# Patient Record
Sex: Female | Born: 1945
Health system: Southern US, Community
[De-identification: ages and names within clinical notes are randomized; demographics above are authoritative.]

## PROBLEM LIST (undated history)

## (undated) DIAGNOSIS — E785 Hyperlipidemia, unspecified: Secondary | ICD-10-CM

## (undated) DIAGNOSIS — M545 Low back pain, unspecified: Secondary | ICD-10-CM

## (undated) DIAGNOSIS — I10 Essential (primary) hypertension: Secondary | ICD-10-CM

## (undated) DIAGNOSIS — Z5189 Encounter for other specified aftercare: Secondary | ICD-10-CM

## (undated) DIAGNOSIS — T7840XA Allergy, unspecified, initial encounter: Secondary | ICD-10-CM

## (undated) DIAGNOSIS — I499 Cardiac arrhythmia, unspecified: Secondary | ICD-10-CM

## (undated) DIAGNOSIS — D631 Anemia in chronic kidney disease: Secondary | ICD-10-CM

## (undated) DIAGNOSIS — D649 Anemia, unspecified: Secondary | ICD-10-CM

## (undated) DIAGNOSIS — I739 Peripheral vascular disease, unspecified: Secondary | ICD-10-CM

## (undated) DIAGNOSIS — H269 Unspecified cataract: Secondary | ICD-10-CM

## (undated) DIAGNOSIS — Z96649 Presence of unspecified artificial hip joint: Secondary | ICD-10-CM

## (undated) DIAGNOSIS — J309 Allergic rhinitis, unspecified: Secondary | ICD-10-CM

## (undated) DIAGNOSIS — J302 Other seasonal allergic rhinitis: Secondary | ICD-10-CM

## (undated) DIAGNOSIS — D509 Iron deficiency anemia, unspecified: Secondary | ICD-10-CM

## (undated) DIAGNOSIS — N183 Chronic kidney disease, stage 3 (moderate): Secondary | ICD-10-CM

## (undated) DIAGNOSIS — M1990 Unspecified osteoarthritis, unspecified site: Secondary | ICD-10-CM

## (undated) DIAGNOSIS — C801 Malignant (primary) neoplasm, unspecified: Secondary | ICD-10-CM

## (undated) HISTORY — DX: Allergic rhinitis, unspecified: J30.9

## (undated) HISTORY — DX: Allergy, unspecified, initial encounter: T78.40XA

## (undated) HISTORY — DX: Hyperlipidemia, unspecified: E78.5

## (undated) HISTORY — DX: Essential (primary) hypertension: I10

## (undated) HISTORY — DX: Unspecified cataract: H26.9

## (undated) HISTORY — DX: Presence of unspecified artificial hip joint: Z96.649

## (undated) HISTORY — DX: Peripheral vascular disease, unspecified: I73.9

## (undated) HISTORY — DX: Anemia in chronic kidney disease: D63.1

## (undated) HISTORY — DX: Iron deficiency anemia, unspecified: D50.9

## (undated) HISTORY — DX: Other seasonal allergic rhinitis: J30.2

## (undated) HISTORY — DX: Encounter for other specified aftercare: Z51.89

## (undated) HISTORY — DX: Low back pain, unspecified: M54.50

## (undated) HISTORY — PX: TUBAL LIGATION: SHX77

## (undated) HISTORY — PX: OTHER SURGICAL HISTORY: SHX169

## (undated) HISTORY — DX: Chronic kidney disease, stage 3 (moderate): N18.3

## (undated) HISTORY — DX: Low back pain: M54.5

---

## 2008-10-27 ENCOUNTER — Ambulatory Visit: Payer: Self-pay | Admitting: Family Medicine

## 2008-10-27 DIAGNOSIS — H269 Unspecified cataract: Secondary | ICD-10-CM | POA: Insufficient documentation

## 2008-10-27 DIAGNOSIS — H4010X0 Unspecified open-angle glaucoma, stage unspecified: Secondary | ICD-10-CM | POA: Insufficient documentation

## 2008-10-28 LAB — CONVERTED CEMR LAB
BUN: 11 mg/dL (ref 6–23)
CO2: 30 meq/L (ref 19–32)
Calcium: 9.8 mg/dL (ref 8.4–10.5)
GFR calc Af Amer: 109 mL/min
Glucose, Bld: 105 mg/dL — ABNORMAL HIGH (ref 70–99)
Sodium: 143 meq/L (ref 135–145)

## 2008-10-30 ENCOUNTER — Ambulatory Visit: Payer: Self-pay | Admitting: Family Medicine

## 2008-11-06 ENCOUNTER — Ambulatory Visit: Payer: Self-pay | Admitting: Family Medicine

## 2008-11-06 DIAGNOSIS — R5383 Other fatigue: Secondary | ICD-10-CM

## 2008-11-06 DIAGNOSIS — R5381 Other malaise: Secondary | ICD-10-CM | POA: Insufficient documentation

## 2008-12-08 HISTORY — PX: OTHER SURGICAL HISTORY: SHX169

## 2008-12-10 ENCOUNTER — Ambulatory Visit: Payer: Self-pay | Admitting: Family Medicine

## 2008-12-10 LAB — CONVERTED CEMR LAB
ALT: 13 units/L (ref 0–35)
Alkaline Phosphatase: 52 units/L (ref 39–117)
Bilirubin, Direct: 0.2 mg/dL (ref 0.0–0.3)
CO2: 29 meq/L (ref 19–32)
Calcium: 9.2 mg/dL (ref 8.4–10.5)
GFR calc Af Amer: 49 mL/min
Glucose, Bld: 94 mg/dL (ref 70–99)
LDL Cholesterol: 147 mg/dL
Potassium: 4.1 meq/L (ref 3.5–5.1)
Sodium: 132 meq/L — ABNORMAL LOW (ref 135–145)
TSH: 1.95 microintl units/mL (ref 0.35–5.50)
Total Bilirubin: 0.8 mg/dL (ref 0.3–1.2)
Total CHOL/HDL Ratio: 5.2
Triglycerides: 132 mg/dL (ref 0–149)

## 2008-12-15 ENCOUNTER — Encounter: Payer: Self-pay | Admitting: Family Medicine

## 2008-12-15 ENCOUNTER — Other Ambulatory Visit: Admission: RE | Admit: 2008-12-15 | Discharge: 2008-12-15 | Payer: Self-pay | Admitting: Family Medicine

## 2008-12-15 ENCOUNTER — Ambulatory Visit: Payer: Self-pay | Admitting: Family Medicine

## 2008-12-15 DIAGNOSIS — E785 Hyperlipidemia, unspecified: Secondary | ICD-10-CM | POA: Insufficient documentation

## 2008-12-15 DIAGNOSIS — I10 Essential (primary) hypertension: Secondary | ICD-10-CM | POA: Insufficient documentation

## 2008-12-15 LAB — CONVERTED CEMR LAB
HDL goal, serum: 40 mg/dL
LDL Goal: 130 mg/dL

## 2008-12-18 ENCOUNTER — Encounter (INDEPENDENT_AMBULATORY_CARE_PROVIDER_SITE_OTHER): Payer: Self-pay | Admitting: *Deleted

## 2009-01-13 ENCOUNTER — Ambulatory Visit: Payer: Self-pay | Admitting: Family Medicine

## 2009-01-13 DIAGNOSIS — R21 Rash and other nonspecific skin eruption: Secondary | ICD-10-CM | POA: Insufficient documentation

## 2009-01-21 ENCOUNTER — Telehealth: Payer: Self-pay | Admitting: Family Medicine

## 2009-03-19 ENCOUNTER — Ambulatory Visit: Payer: Self-pay | Admitting: Family Medicine

## 2009-03-23 LAB — CONVERTED CEMR LAB: Cholesterol: 219 mg/dL — ABNORMAL HIGH (ref 0–200)

## 2009-03-31 ENCOUNTER — Ambulatory Visit: Payer: Self-pay | Admitting: Family Medicine

## 2009-05-07 HISTORY — PX: OTHER SURGICAL HISTORY: SHX169

## 2009-06-03 ENCOUNTER — Encounter: Admission: RE | Admit: 2009-06-03 | Discharge: 2009-06-03 | Payer: Self-pay | Admitting: Family Medicine

## 2009-06-03 ENCOUNTER — Ambulatory Visit: Payer: Self-pay | Admitting: Family Medicine

## 2009-06-03 DIAGNOSIS — M79609 Pain in unspecified limb: Secondary | ICD-10-CM

## 2009-06-03 DIAGNOSIS — M5416 Radiculopathy, lumbar region: Secondary | ICD-10-CM | POA: Insufficient documentation

## 2009-06-04 ENCOUNTER — Ambulatory Visit: Payer: Self-pay

## 2009-06-04 ENCOUNTER — Encounter: Payer: Self-pay | Admitting: Family Medicine

## 2009-06-17 ENCOUNTER — Ambulatory Visit: Payer: Self-pay | Admitting: Cardiovascular Disease

## 2009-06-17 DIAGNOSIS — I70219 Atherosclerosis of native arteries of extremities with intermittent claudication, unspecified extremity: Secondary | ICD-10-CM | POA: Insufficient documentation

## 2009-06-24 ENCOUNTER — Ambulatory Visit: Payer: Self-pay | Admitting: Family Medicine

## 2009-06-29 ENCOUNTER — Encounter: Admission: RE | Admit: 2009-06-29 | Discharge: 2009-06-29 | Payer: Self-pay | Admitting: Family Medicine

## 2009-07-08 ENCOUNTER — Encounter: Payer: Self-pay | Admitting: Family Medicine

## 2009-07-09 ENCOUNTER — Telehealth: Payer: Self-pay | Admitting: Family Medicine

## 2009-07-21 ENCOUNTER — Encounter: Payer: Self-pay | Admitting: Family Medicine

## 2009-08-03 ENCOUNTER — Telehealth: Payer: Self-pay | Admitting: Family Medicine

## 2009-09-14 ENCOUNTER — Ambulatory Visit: Payer: Self-pay | Admitting: Cardiovascular Disease

## 2009-09-29 ENCOUNTER — Ambulatory Visit: Payer: Self-pay | Admitting: Family Medicine

## 2009-09-30 ENCOUNTER — Encounter: Payer: Self-pay | Admitting: Family Medicine

## 2009-10-08 ENCOUNTER — Encounter (INDEPENDENT_AMBULATORY_CARE_PROVIDER_SITE_OTHER): Payer: Self-pay | Admitting: *Deleted

## 2009-11-07 HISTORY — PX: CATARACT EXTRACTION, BILATERAL: SHX1313

## 2009-11-13 ENCOUNTER — Telehealth: Payer: Self-pay | Admitting: Family Medicine

## 2010-01-21 ENCOUNTER — Ambulatory Visit: Payer: Self-pay | Admitting: Family Medicine

## 2010-02-02 ENCOUNTER — Telehealth: Payer: Self-pay | Admitting: Family Medicine

## 2010-11-07 DIAGNOSIS — IMO0001 Reserved for inherently not codable concepts without codable children: Secondary | ICD-10-CM

## 2010-11-07 DIAGNOSIS — Z5189 Encounter for other specified aftercare: Secondary | ICD-10-CM

## 2010-11-07 HISTORY — DX: Reserved for inherently not codable concepts without codable children: IMO0001

## 2010-11-07 HISTORY — DX: Encounter for other specified aftercare: Z51.89

## 2010-12-09 NOTE — Progress Notes (Signed)
Summary: BP readings  Phone Note Call from Patient Call back at Home Phone [phone]   Caller: Patient Summary of Call: Pt was told to call and report BP readings.  She said they are still up, averaging 165/83.  Taking  BP medicine. Initial call taken by: Marty Heck CMA,  February 02, 2010 9:49 AM  Follow-up for Phone Call        OK, we will have to increase her Hydralazine - the new medication that I put her on. I am going to increase this to 25 mg three times a day and send to [facility]. Follow-up by: Owens Loffler MD,  February 03, 2010 9:12 AM  Additional Follow-up for Phone Call Additional follow up Details #1::        Advised pt. Additional Follow-up by: Marty Heck CMA,  February 03, 2010 9:27 AM    New/Updated Medications: HYDRALAZINE HCL 25 MG TABS (HYDRALAZINE HCL) 1 by mouth three times a day Prescriptions: HYDRALAZINE HCL 25 MG TABS (HYDRALAZINE HCL) 1 by mouth three times a day  #90 x 5   Entered and Authorized by:   Owens Loffler MD   Signed by:   Owens Loffler MD on 02/03/2010   Method used:   Electronically to        [id number] (retail)       [address] [place], [place]  [address]       Ph: [phone]       Fax: [phone]   RxID:   [id number]

## 2010-12-09 NOTE — Assessment & Plan Note (Signed)
Summary: F/U BLOOD PRESSURE/CLE   Vital Signs:  Patient profile:   65 year old female Height:      63 inches Weight:      161 pounds BMI:     28.62 Temp:     98.1 degrees F oral Pulse rate:   76 / minute Pulse rhythm:   regular BP sitting:   160 / 90  (left arm) Cuff size:   regular  Vitals Entered By: Zenda Alpers CMA Deborra Medina) (January 21, 2010 11:57 AM)  Serial Vital Signs/Assessments:  Time      Position  BP       Pulse  Resp  Temp     By                     200/102                        Zenda Alpers CMA (AAMA)  CC: CHECK BP   History of Present Illness: Chief complaint follow up bp  Recheck 160/90 after clonidine 0.1 mg  BP has been running 160's / 90's - mulitple meds changed after loss of insurance  Lost her job, and went ahead and retired. Plenty around house to do  Lives with home and son who is 21. Son doing OK.    ROS: GEN: No acute illnesses, no fevers, chills, sweats, fatigue, weight loss, or URI sx. GI: No n/v/d Pulm: No SOB, cough, wheezing Interactive and getting along well at home.  Otherwise, ROS is as per the HPI. No symptoms  GEN: WDWN, NAD, Non-toxic, A & O x 3 HEENT: Atraumatic, Normocephalic. Neck supple. No masses, No LAD. Ears and Nose: No external deformity. CV: RRR, No M/G/R. No JVD. No thrill. No extra heart sounds. PULM: CTA B, no wheezes, crackles, rhonchi. No retractions. No resp. distress. No accessory muscle use. EXTR: No c/c/e NEURO: Normal gait.  PSYCH: Normally interactive. Conversant. Not depressed or anxious appearing.  Calm demeanor.    Allergies (verified): No Known Drug Allergies   Impression & Recommendations:  Problem # 1:  HYPERTENSION (ICD-401.9) Assessment Deteriorated Elevated - start hydralazine and expect will need to titrate up dose.  Limited based on funds, and tried to work with multiple 4 dollar plans to get under control as good as possible  Her updated medication list for this problem includes:    Lisinopril-hydrochlorothiazide 20-12.5 Mg Tabs (Lisinopril-hydrochlorothiazide) .Marland Kitchen... 2 tabs by mouth daily    Metoprolol Tartrate 100 Mg Tabs (Metoprolol tartrate) .Marland Kitchen... 2 by mouth two times a day    Hydralazine Hcl 10 Mg Tabs (Hydralazine hcl) .Marland Kitchen... 1 by mouth three times a day  Complete Medication List: 1)  Multivitamins Tabs (Multiple vitamin) .... Take 1 tablet by mouth once a day 2)  Calcium Carbonate-vitamin D 600-400 Mg-unit Tabs (Calcium carbonate-vitamin d) .... Take 1 tablet by mouth once a day 3)  Eye Vitamins Caps (Multiple vitamins-minerals) .... Take 1 capsule by mouth once a day 4)  Lisinopril-hydrochlorothiazide 20-12.5 Mg Tabs (Lisinopril-hydrochlorothiazide) .... 2 tabs by mouth daily 5)  Metoprolol Tartrate 100 Mg Tabs (Metoprolol tartrate) .... 2 by mouth two times a day 6)  Triamcinolone Acetonide 0.1 % Crea (Triamcinolone acetonide) .... Apply to rash two times a day 7)  Aspirin 81 Mg Tbec (Aspirin) .... Take one tablet by mouth daily 8)  Pletal 100 Mg Tabs (Cilostazol) .... One tab twice a day 9)  Hydralazine Hcl 10 Mg Tabs (Hydralazine hcl) .Marland KitchenMarland KitchenMarland Kitchen  1 by mouth three times a day  Patient Instructions: 1)  Call me in 1 week with some BP readings 2)  goal less than 140 / 90 Prescriptions: HYDRALAZINE HCL 10 MG TABS (HYDRALAZINE HCL) 1 by mouth three times a day  #90 x 1   Entered and Authorized by:   Owens Loffler MD   Signed by:   Owens Loffler MD on 01/21/2010   Method used:   Electronically to        [facility] (retail)       [address] [place], [place]  [address]       Ph: [phone]       Fax: [phone]   RxID:   [id number]   Current Allergies (reviewed today): No known allergies    Medication Administration  Injection # 1:    Medication: clonodine 0.1    Diagnosis: HYPERTENSION (ICD-401.9)    Route: PO    Site: MOUTH    Given by: Zenda Alpers CMA Deborra Medina) (January 21, 2010 12:14  PM)  Orders Added: 1)  Est. Patient Level III [99213]   Vital Signs Ht: 63 in.  Wt: 161 lbs.  T: 98.1 deg F.  T site: oral  P: 76  Rhythm: regular  BP: 160/90 mm Hg

## 2010-12-09 NOTE — Progress Notes (Signed)
Summary: BP concerns  Phone Note Call from Patient Call back at Home Phone [phone]   Caller: Patient Call For: Owens Loffler MD Summary of Call: Patient called to let Dr. Lorelei Pont know that since he changed her BP medicine her BP has been running high.  She was on Norvasc 10mg  once daily, now she is taking Lisinopril-HCTZ 20-12.5mg  one tablet twice daily and Metoprolol 100mg  one tablet twice daily.  Before he changed her medication she said her BP was running around 120/80, now it is between [id number] systolic, [id number] diastolic.  Please advise Initial call taken by: Sherrian Divers CMA Deborra Medina),  November 13, 2009 9:23 AM  Follow-up for Phone Call        Non emergent BPs...wait recs of Dr. Lorelei Pont on Monday. Continue current meds.  Follow-up by: Eliezer Lofts MD,  November 13, 2009 1:01 PM  Additional Follow-up for Phone Call Additional follow up Details #1::        Patient advised as instructed. Additional Follow-up by: Sherrian Divers CMA Deborra Medina),  November 13, 2009 1:54 PM    Additional Follow-up for Phone Call Additional follow up Details #2::    Increase Metoprolol up to 200 mg by mouth two times a day   follow-up with me in 2-4 weeks  If she gets faint doing this, will have to decrease back to 100 mg two times a day and add something else  ok to call in more med, i think she was going to go to [facility] Follow-up by: Owens Loffler MD,  November 18, 2009 4:59 PM  New/Updated Medications: METOPROLOL TARTRATE 100 MG TABS (METOPROLOL TARTRATE) 2 by mouth two times a day Prescriptions: METOPROLOL TARTRATE 100 MG TABS (METOPROLOL TARTRATE) 2 by mouth two times a day  #120 x 1   Entered by:   Zenda Alpers CMA ([place])   Authorized by:   Owens Loffler MD   Signed by:   Zenda Alpers CMA ([place]) on 11/20/2009   Method used:   Electronically to        [id number] (retail)       [address] [place]       [place],   [address]       Ph:  [phone]       Fax: [phone]   RxID:   [id number]

## 2011-02-03 ENCOUNTER — Other Ambulatory Visit: Payer: Self-pay | Admitting: Family Medicine

## 2011-03-03 ENCOUNTER — Other Ambulatory Visit: Payer: Self-pay | Admitting: Family Medicine

## 2011-05-31 ENCOUNTER — Encounter: Payer: Self-pay | Admitting: Cardiovascular Disease

## 2011-07-19 ENCOUNTER — Ambulatory Visit (INDEPENDENT_AMBULATORY_CARE_PROVIDER_SITE_OTHER): Payer: Medicare Other | Admitting: Family Medicine

## 2011-07-19 ENCOUNTER — Ambulatory Visit: Payer: Self-pay | Admitting: Family Medicine

## 2011-07-19 ENCOUNTER — Encounter: Payer: Self-pay | Admitting: Family Medicine

## 2011-07-19 VITALS — BP 130/86 | HR 63 | Temp 98.0°F | Ht 62.0 in | Wt 153.9 lb

## 2011-07-19 DIAGNOSIS — M76899 Other specified enthesopathies of unspecified lower limb, excluding foot: Secondary | ICD-10-CM

## 2011-07-19 DIAGNOSIS — M7061 Trochanteric bursitis, right hip: Secondary | ICD-10-CM

## 2011-07-19 DIAGNOSIS — I10 Essential (primary) hypertension: Secondary | ICD-10-CM

## 2011-07-19 DIAGNOSIS — M25559 Pain in unspecified hip: Secondary | ICD-10-CM

## 2011-07-19 DIAGNOSIS — M25551 Pain in right hip: Secondary | ICD-10-CM

## 2011-07-19 MED ORDER — METOPROLOL SUCCINATE ER 200 MG PO TB24: 200.0000 mg | ORAL_TABLET | Freq: Every day | ORAL | Status: DC

## 2011-07-19 MED ORDER — AMLODIPINE BESYLATE 10 MG PO TABS: 10.0000 mg | ORAL_TABLET | Freq: Every day | ORAL | Status: DC

## 2011-07-19 MED ORDER — LISINOPRIL-HYDROCHLOROTHIAZIDE 20-25 MG PO TABS: 1.0000 | ORAL_TABLET | Freq: Every day | ORAL | Status: DC

## 2011-07-19 NOTE — Patient Instructions (Addendum)
Start new BP meds tomorrow morning.   Hip Rehab:  Hip Flexion: Toe up to ceiling, laying on your back. Lift your whole leg, 3 sets. Work up to being able to do #30 with each set.  Hip elevations, Toe and leg turned out to side.  Lift whole leg, 3 sets. Work up to being able to do #30 with each set.  Lying on side: 3 sets of 20. (Hip abductions)

## 2011-07-19 NOTE — Progress Notes (Signed)
Subjective:    Patient ID: Taylor Bush, female    DOB: Feb 20, 1946, 65 y.o.   MRN: [id number]  HPI  Blyss Pogue, a 65 y.o. female presents today in the office for the following:    Hurts when walking in the posterior of buttocks. Sometimes will wake up at night.   Primarily right greater than left lateral buttocks pain and lateral hip pain with also some mild muscular groin pain. No deep groin pain. No back pain. No radicular pain. No numbness or tingling. No bowel or bladder incontinence. The patient points to her lateral hip in and around the greater trochanter and into the gluteus medius on the right side.  HTN: Tolerating all medications without side effects - but would like to change back to all her prior BP meds given that she now has insurance so she can take once a day Stable and at goal No CP, no sob. No HA.  BP Readings from Last 3 Encounters:  07/19/11 130/86  01/21/10 160/90  09/29/09 [id number]    Basic Metabolic Panel:    Component Value Date/Time   NA 132* 12/10/2008 1000   K 4.1 12/10/2008 1000   CL 95* 12/10/2008 1000   CO2 29 12/10/2008 1000   BUN 29* 12/10/2008 1000   CREATININE 1.4* 12/10/2008 1000   GLUCOSE 94 12/10/2008 1000   CALCIUM 9.2 12/10/2008 1000      The PMH, PSH, Social History, Family History, Medications, and allergies have been reviewed in [facility], and have been updated if relevant.   Review of Systems REVIEW OF SYSTEMS  GEN: No fevers, chills. Nontoxic. Primarily MSK c/o today. MSK: Detailed in the HPI GI: tolerating PO intake without difficulty Neuro: No numbness, parasthesias, or tingling associated. Otherwise the pertinent positives of the ROS are noted above.      Objective:   Physical Exam   Physical Exam  Blood pressure 130/86, pulse 63, temperature 98 F (36.7 C), temperature source Oral, height 5\' 2"  (1.575 m), weight 153 lb 13.9 oz (69.795 kg), SpO2 97.00%.  GEN: WDWN, NAD, Non-toxic, A & O x 3 HEENT: Atraumatic, Normocephalic. Neck supple.  No masses, No LAD. Ears and Nose: No external deformity. CV: RRR, No M/G/R. No JVD. No thrill. No extra heart sounds. PULM: CTA B, no wheezes, crackles, rhonchi. No retractions. No resp. distress. No accessory muscle use. EXTR: No c/c/e NEURO Normal gait.  PSYCH: Normally interactive. Conversant. Not depressed or anxious appearing.  Calm demeanor.    HIP EXAM: SIDE: B ROM: Abduction, Flexion, Internal and External range of motion: Considerable loss of motion bilaterally with motion at the hip with abduction, internal/external rotation. Pain with terminal IROM and EROM: Terminal internal range of motion causes lateral buttocks pain, not groin pain. GTB: + on the R SLR: NEG Knees: No effusion FABER: NT REVERSE FABER: Mod ttp Piriformis: NT at direct palpation Str: flexion: 4+/5 abduction: 4/5 adduction: 4/5 Diminished strength on the right, on the left, strength is 4+/5 throughout.        Assessment & Plan:    1. Hip pain, bilateral    2. HYPERTENSION  amLODipine (NORVASC) 10 MG tablet, lisinopril-hydrochlorothiazide (PRINZIDE,ZESTORETIC) 20-25 MG per tablet, metoprolol (TOPROL-XL) 200 MG 24 hr tablet  3. Trochanteric bursitis of right hip      Suspect combination of trochanteric bursitis on the right as well as some other secondary bursitis. Patient pointed using palpation at the point of maximal tenderness, and this is often seen around the anterior pelvic stability musculature  and there insertion around the pelvis.  Trochanteric Bursitis Injection Verbal consent obtained. Risks, benefits, and alternatives reviewed. R greater trochanter sterilely prepped with Betadine. Ethyl Chloride used for anesthesia. 8 cc of Lidocaine 1% injected with 2 cc of 40 mg Kenalog into trochanteric bursa at area of maximal tenderness at greater trochanter. Needle taken to bone to troch bursa, flows easily. Bursa massaged. One third of this mixture injected at the greater trochanteric bursa, and 2  other areas of what I believe is other areas of bursitis at the lateral hip were injected each with one third of this mixture. No bleeding and no complications. Decreased pain after injection. Needle: 22 gauge spinal needle

## 2011-08-01 ENCOUNTER — Ambulatory Visit
Admission: RE | Admit: 2011-08-01 | Discharge: 2011-08-01 | Disposition: A | Discharge status: Home / Self Care | Payer: Medicare Other | Source: Ambulatory Visit | Attending: Family Medicine | Admitting: Family Medicine

## 2011-08-01 ENCOUNTER — Ambulatory Visit (INDEPENDENT_AMBULATORY_CARE_PROVIDER_SITE_OTHER)
Admission: RE | Admit: 2011-08-01 | Discharge: 2011-08-01 | Disposition: A | Discharge status: Home / Self Care | Payer: Medicare Other | Source: Ambulatory Visit | Attending: Family Medicine | Admitting: Family Medicine

## 2011-08-01 ENCOUNTER — Encounter: Payer: Self-pay | Admitting: Family Medicine

## 2011-08-01 ENCOUNTER — Ambulatory Visit (INDEPENDENT_AMBULATORY_CARE_PROVIDER_SITE_OTHER): Payer: Medicare Other | Admitting: Family Medicine

## 2011-08-01 VITALS — BP 120/72 | HR 59 | Temp 98.5°F | Wt 149.4 lb

## 2011-08-01 DIAGNOSIS — I10 Essential (primary) hypertension: Secondary | ICD-10-CM

## 2011-08-01 DIAGNOSIS — M25559 Pain in unspecified hip: Secondary | ICD-10-CM

## 2011-08-01 DIAGNOSIS — M25551 Pain in right hip: Secondary | ICD-10-CM

## 2011-08-01 DIAGNOSIS — M87052 Idiopathic aseptic necrosis of left femur: Secondary | ICD-10-CM

## 2011-08-01 DIAGNOSIS — IMO0002 Reserved for concepts with insufficient information to code with codable children: Secondary | ICD-10-CM

## 2011-08-01 DIAGNOSIS — M79609 Pain in unspecified limb: Secondary | ICD-10-CM

## 2011-08-01 DIAGNOSIS — M87059 Idiopathic aseptic necrosis of unspecified femur: Secondary | ICD-10-CM

## 2011-08-01 DIAGNOSIS — M87051 Idiopathic aseptic necrosis of right femur: Secondary | ICD-10-CM

## 2011-08-01 DIAGNOSIS — M25552 Pain in left hip: Secondary | ICD-10-CM

## 2011-08-01 MED ORDER — METOPROLOL TARTRATE 100 MG PO TABS: 100.0000 mg | ORAL_TABLET | Freq: Two times a day (BID) | ORAL | Status: DC

## 2011-08-01 MED ORDER — MELOXICAM 15 MG PO TABS: 15.0000 mg | ORAL_TABLET | Freq: Every day | ORAL | Status: DC

## 2011-08-01 MED ORDER — TRAMADOL HCL 50 MG PO TABS: 50.0000 mg | ORAL_TABLET | Freq: Four times a day (QID) | ORAL | Status: AC | PRN

## 2011-08-01 NOTE — Progress Notes (Signed)
Subjective:    Patient ID: Taylor Bush, female    DOB: [birth year], 65 y.o.   MRN: [id number]  HPI  Zettie Schult, a 65 y.o. female presents today in the office for the following:    2 primary reasons.  Spot on R foot. Patient was concerned about an area on her RIGHT forefoot, where she had a callus, now there is a blister.  Bilateral hip pain, RIGHT greater than LEFT. I saw patient on July 19, 2011, and at that time she seemed to mostly be having lateral hip and buttocks pain with also some additional anterior hip pain, mostly on the LEFT. At that time we did do a trochanteric bursa injection, and all those symptoms have resolved at this point. She still complains of significant anterior hip pain, now directed more of her complaints in the true groin region. R anterior hip pain. Hurting a lot. Caught a lot in the right hip. Back is also hurting.  He does on further questioning remember a horse accident years ago where she could have hurt her hip. She bring this up to me after I call her later on.  The PMH, PSH, Social History, Family History, Medications, and allergies have been reviewed in [facility], and have been updated if relevant.   Review of Systems REVIEW OF SYSTEMS  GEN: No fevers, chills. Nontoxic. Primarily MSK c/o today. MSK: Detailed in the HPI GI: tolerating PO intake without difficulty Neuro: No numbness, parasthesias, or tingling associated. Otherwise the pertinent positives of the ROS are noted above.      Objective:   Physical Exam   Physical Exam  Blood pressure 120/72, pulse 59, temperature 98.5 F (36.9 C), temperature source Oral, weight 149 lb 6.4 oz (67.767 kg), SpO2 97.00%.  GEN: Well-developed,well-nourished,in no acute distress; alert,appropriate and cooperative throughout examination HEENT: Normocephalic and atraumatic without obvious abnormalities. Ears, externally no deformities PULM: Breathing comfortably in no respiratory distress EXT: No clubbing,  cyanosis, or edema PSYCH: Normally interactive. Cooperative during the interview. Pleasant. Friendly and conversant. Not anxious or depressed appearing. Normal, full affect.  HIP EXAM: SIDE: B ROM: Abduction, Flexion, Internal and External range of motion: markedly limited with abduction, and on the RIGHT only able to get about 15-20 of rotation and though improved on the LEFT, only minimally so. Pain with terminal IROM and EROM: no GTB: NT SLR: NEG Knees: No effusion Piriformis: NT at direct palpation Str: flexion: 4+/5 abduction: 4+/5 adduction: 4+/5 Strength testing non-tender         Assessment & Plan:   1. Hip pain, bilateral  DG Hip Complete Left, DG Hip Complete Right, meloxicam (MOBIC) 15 MG tablet, traMADol (ULTRAM) 50 MG tablet, Ambulatory referral to Orthopedic Surgery  2. HYPERTENSION  metoprolol (LOPRESSOR) 100 MG tablet  3. BACK PAIN, LUMBAR, WITH RADICULOPATHY    4. LEG PAIN, BILATERAL    5. Avascular necrosis of femur head, left  Ambulatory referral to Orthopedic Surgery  6. Avascular necrosis of right femoral head  Ambulatory referral to Orthopedic Surgery    Xray, hip series, B Indication: pain Findings: reviewed myself, and concur with radiology that this appears to be bilateral  Case of avascular necrosis with the RIGHT hip appearing worse than the LEFT. I do not have any distant hip films to compare.  I called the patient and discussed the case with  I did my best to explain the problems and severity associated with this. Consult Dr. Ricki Rodriguez for his opinion and expertise in this complex hip  case. Appreciate his assistance.

## 2011-08-17 ENCOUNTER — Ambulatory Visit: Payer: Medicare Other | Admitting: Family Medicine

## 2011-09-05 ENCOUNTER — Encounter: Payer: Self-pay | Admitting: Cardiovascular Disease

## 2011-09-05 ENCOUNTER — Ambulatory Visit (INDEPENDENT_AMBULATORY_CARE_PROVIDER_SITE_OTHER): Payer: Medicare Other | Admitting: Cardiovascular Disease

## 2011-09-05 DIAGNOSIS — Z01818 Encounter for other preprocedural examination: Secondary | ICD-10-CM

## 2011-09-05 DIAGNOSIS — I70219 Atherosclerosis of native arteries of extremities with intermittent claudication, unspecified extremity: Secondary | ICD-10-CM

## 2011-09-05 DIAGNOSIS — I10 Essential (primary) hypertension: Secondary | ICD-10-CM

## 2011-09-05 DIAGNOSIS — I739 Peripheral vascular disease, unspecified: Secondary | ICD-10-CM

## 2011-09-05 NOTE — Assessment & Plan Note (Signed)
The patient has pending orthopedic surgery with upcoming hip replacement. This is a moderate risk procedure from a cardiovascular standpoint. She does not have active cardiac symptoms, but her functional capacity is low. Her cardiac risk factors include hypertension, hypercholesterolemia, tobacco use (quit last year), and known peripheral arterial disease.  I have recommended a Lexiscan Myoview stress test to rule out significant ischemia in this patient with a high coronary risk profile and low functional capacity.

## 2011-09-05 NOTE — Progress Notes (Signed)
HPI:  This is a 65 year old woman presenting for preoperative cardiovascular evaluation. The patient has developed avascular necrosis of her hips and she will require total hip arthroplasty bilaterally. Her first scheduled surgery is November 26 when Dr. Wynelle Link is going to do her right hip.  The patient has marked limitation related to her hip pain. She does very low walking at the present time and has not done any physical exertion now for many months. She denies chest pain, dyspnea, edema, orthopnea, or PND. However, as noted above her functional capacity is very low.  Outpatient Encounter Prescriptions as of 09/05/2011  Medication Sig Dispense Refill  . amLODipine (NORVASC) 10 MG tablet Take 1 tablet (10 mg total) by mouth daily.  30 tablet  11  . lisinopril-hydrochlorothiazide (PRINZIDE,ZESTORETIC) 20-25 MG per tablet Take 1 tablet by mouth daily.  30 tablet  11  . meloxicam (MOBIC) 15 MG tablet Take 1 tablet (15 mg total) by mouth daily.  30 tablet  2  . metoprolol (LOPRESSOR) 100 MG tablet Take 1 tablet (100 mg total) by mouth 2 (two) times daily.  60 tablet  11  . DISCONTD: aspirin 81 MG EC tablet Take 81 mg by mouth daily.        Marland Kitchen DISCONTD: Calcium Carbonate-Vitamin D 600-400 MG-UNIT per tablet Take 1 tablet by mouth daily.        Marland Kitchen DISCONTD: fish oil-omega-3 fatty acids 1000 MG capsule Take 2 g by mouth 2 (two) times daily.        Marland Kitchen DISCONTD: Multiple Vitamin (MULTIVITAMIN) tablet Take 1 tablet by mouth daily.        Marland Kitchen DISCONTD: Multiple Vitamins-Minerals (EYE VITAMINS) CAPS Take 1 capsule by mouth daily.          Allergies  Allergen Reactions  . Statins     Past Medical History  Diagnosis Date  . Bilateral leg pain   . HTN (hypertension)   . HLD (hyperlipidemia)   . Fatigue   . Lumbar back pain     with radiculopathy  . Rash     nonvesicular  . Routine general medical examination at a health care facility   . Screening for lipoid disorders   . Cataract   . Open-angle  glaucoma   . PAD (peripheral artery disease)     with intermittent claudication, ABI 0.72 on Rt and 0.73 on L when assessed in 2010.     ROS: Negative except as per HPI  BP 142/80  Pulse 65  Resp 18  Ht 5\' 3"  (1.6 m)  Wt 144 lb 12.8 oz (65.681 kg)  BMI 25.65 kg/m2  PHYSICAL EXAM: Pt is alert and oriented, NAD HEENT: normal Neck: JVP - normal, carotids 2+= without bruits Lungs: CTA bilaterally CV: RRR without murmur or gallop Abd: soft, NT, Positive BS, no hepatomegaly Ext: no C/C/E, DP and PT pulses are nonpalpable Skin: warm/dry no rash  EKG:  Normal sinus rhythm, nonspecific ST and T wave abnormality, heart rate 65 beats per minute.  ASSESSMENT AND PLAN:

## 2011-09-05 NOTE — Assessment & Plan Note (Signed)
The patient has an abnormal pulse exam. I have reviewed her ABIs from previous and they are about 70% bilaterally. She has no claudication symptoms. Her primary limitation is related to her hip problem. She can be continued on medical therapy with respect to her PAD as there are no signs of critical limb ischemia.

## 2011-09-05 NOTE — Patient Instructions (Signed)
Your physician has requested that you have a lexiscan myoview. For further information please visit [email or web address]. Please follow instruction sheet, as given.  Your physician wants you to follow-up in: 1 YEAR.  You will receive a reminder letter in the mail two months in advance. If you don't receive a letter, please call our office to schedule the follow-up appointment.  Your physician recommends that you continue on your current medications as directed. Please refer to the Current Medication list given to you today.

## 2011-09-05 NOTE — Assessment & Plan Note (Signed)
Blood pressure is controlled on her current medical therapy which includes amlodipine, lisinopril, hydrochlorothiazide, and metoprolol.

## 2011-09-13 ENCOUNTER — Ambulatory Visit (HOSPITAL_COMMUNITY): Payer: Medicare Other | Attending: Internal Medicine | Admitting: Radiology

## 2011-09-13 DIAGNOSIS — Z87891 Personal history of nicotine dependence: Secondary | ICD-10-CM | POA: Insufficient documentation

## 2011-09-13 DIAGNOSIS — R0609 Other forms of dyspnea: Secondary | ICD-10-CM | POA: Insufficient documentation

## 2011-09-13 DIAGNOSIS — E785 Hyperlipidemia, unspecified: Secondary | ICD-10-CM | POA: Insufficient documentation

## 2011-09-13 DIAGNOSIS — R0989 Other specified symptoms and signs involving the circulatory and respiratory systems: Secondary | ICD-10-CM | POA: Insufficient documentation

## 2011-09-13 DIAGNOSIS — R0602 Shortness of breath: Secondary | ICD-10-CM

## 2011-09-13 DIAGNOSIS — I10 Essential (primary) hypertension: Secondary | ICD-10-CM | POA: Insufficient documentation

## 2011-09-13 DIAGNOSIS — Z01810 Encounter for preprocedural cardiovascular examination: Secondary | ICD-10-CM | POA: Insufficient documentation

## 2011-09-13 DIAGNOSIS — R9431 Abnormal electrocardiogram [ECG] [EKG]: Secondary | ICD-10-CM

## 2011-09-13 DIAGNOSIS — R002 Palpitations: Secondary | ICD-10-CM | POA: Insufficient documentation

## 2011-09-13 DIAGNOSIS — I739 Peripheral vascular disease, unspecified: Secondary | ICD-10-CM

## 2011-09-13 MED ORDER — TECHNETIUM TC 99M TETROFOSMIN IV KIT
11.0000 | PACK | Freq: Once | INTRAVENOUS | Status: AC | PRN
  Administered 2011-09-13: 11 via INTRAVENOUS

## 2011-09-13 MED ORDER — TECHNETIUM TC 99M TETROFOSMIN IV KIT
33.0000 | PACK | Freq: Once | INTRAVENOUS | Status: AC | PRN
  Administered 2011-09-13: 33 via INTRAVENOUS

## 2011-09-13 MED ORDER — REGADENOSON 0.4 MG/5ML IV SOLN
0.4000 mg | Freq: Once | INTRAVENOUS | Status: AC
  Administered 2011-09-13: 0.4 mg via INTRAVENOUS

## 2011-09-13 NOTE — Progress Notes (Signed)
[facility] [place] [address] [phone]  Cardiology Nuclear Med Study  Taylor Bush is a 65 y.o. female [id number] 01/07/1946   Nuclear Med Background Indication for Stress Test:  Evaluation for Ischemia, Pending Surgical Clearance: Hip replacement- Dr. Pilar Plate Aluisio, and Abnormal EKG History:  No previous documented CAD Cardiac Risk Factors: Claudication, History of Smoking, Hypertension, Lipids and PVD  Symptoms:  DOE and Palpitations   Nuclear Pre-Procedure Caffeine/Decaff Intake:  None NPO After: 7:00pm   Lungs:  clear IV 0.9% NS with Angio Cath:  22g  IV Site: R Antecubital  IV Started by:  Eliezer Lofts, EMT-P  Chest Size (in):  38 Cup Size: B  Height: 5\' 2"  (1.575 m)  Weight:  139 lb (63.05 kg)  BMI:  Body mass index is 25.42 kg/(m^2). Tech Comments:  Lopressor held > 24 hours, per patient.    Nuclear Med Study 1 or 2 day study: 1 day  Stress Test Type:  Carlton Adam  Reading MD: Glori Bickers, MD  Order Authorizing Provider:  M.Cooper  Resting Radionuclide: Technetium 22m Tetrofosmin  Resting Radionuclide Dose: 11.0 mCi   Stress Radionuclide:  Technetium 76m Tetrofosmin  Stress Radionuclide Dose: 33.0 mCi           Stress Protocol Rest HR: 68 Stress HR: 103  Rest BP: 106/61 Stress BP: 126/57  Exercise Time (min): n/a METS: n/a   Predicted Max HR: 155 bpm % Max HR: 66.45 bpm Rate Pressure Product: 12978   Dose of Adenosine (mg):  n/a Dose of Lexiscan: 0.4 mg  Dose of Atropine (mg): n/a Dose of Dobutamine: n/a mcg/kg/min (at max HR)  Stress Test Technologist: Perrin Maltese, EMT-P  Nuclear Technologist:  Annye Rusk, CNMT     Rest Procedure:  Myocardial perfusion imaging was performed at rest 45 minutes following the intravenous administration of Technetium 77m Tetrofosmin. Rest ECG: NSR  Stress Procedure:  The patient received IV Lexiscan 0.4 mg over 15-seconds.  Technetium 26m Tetrofosmin injected  at 30-seconds.  There were no significant changes,+ dizziness,a funny feeling in her head, and rare pvcs/pac with Lexiscan.  Quantitative spect images were obtained after a 45 minute delay. Stress ECG: No significant change from baseline ECG  QPS Raw Data Images:  Normal; no motion artifact; normal heart/lung ratio. Stress Images:  Normal homogeneous uptake in all areas of the myocardium. Rest Images:  Normal homogeneous uptake in all areas of the myocardium. Subtraction (SDS):  Normal Transient Ischemic Dilatation (Normal <1.22):  1.01 Lung/Heart Ratio (Normal <0.45):  0.29  Quantitative Gated Spect Images QGS EDV:  48 ml QGS ESV:  11 ml QGS cine images:  NL LV Function; NL Wall Motion QGS EF: 78%  Impression Exercise Capacity:  Lexiscan with no exercise. BP Response:  n/a Clinical Symptoms:  n/a ECG Impression:  No significant ECG changes with Lexiscan. Comparison with Prior Nuclear Study: No previous nuclear study performed  Overall Impression:  Normal stress nuclear study.    Daniel Bensimhon

## 2011-09-15 ENCOUNTER — Other Ambulatory Visit: Payer: Self-pay | Admitting: Orthopedic Surgery

## 2011-09-15 NOTE — H&P (Signed)
Taylor Bush DOB: 11/30/1945  Date of Admission:  10/03/2011  Chief Complaint: Right Hip Pain  History of Present Illness The patient is a 65 year old female who comes in today for a preoperative History and Physical. The patient is scheduled for a right total hip arthroplasty to be performed by Dr. Dione Plover. Aluisio, MD at [facility] on 10/03/2011 . They have been seen for right hip pain. Risks and benefits of the surgery have been discussed with the patient and they elect to proceed with surgery.  There are on active contraindications to upcoming procedure such as ongoing infection or progressive neurological disease.  Problem List/Past Medical AVN (avascular necrosis of bone) (733.40) Lumbago (724.2). 07/08/2009 Osteoarthrosis NOS, other spec site (715.98). 07/08/2009 Degeneration, lumbar/lumbosacral disc (722.52). 07/08/2009 Hypertension Impaired Vision Varicose veins Hemorrhoids  Allergies PredniSONE *CORTICOSTEROIDS*. joint aches Sulfanomides  Family History: Hypertension. mother and father  Social History Copy of Drug/Alcohol Rehab (Previously). no Children. 2 Current work status. retired Alcohol use. never consumed alcohol Drug/Alcohol Rehab (Currently). no Marital status. married Exercise. Exercises rarely Living situation. live with spouse Illicit drug use. no Tobacco / smoke exposure. yes Tobacco use. former smoker Pain Contract. yes Post-Surgical Plans. Plans for home after surgery   Medication History Metoprolol Succinate (100 mg Oral two times daily) Specific dose unknown - Active. AmLODIPine Besylate (10 mg daily) Specific dose unknown - Active. Lisinopril-Hydrochlorothiazide (20-25MG  Tablet, Oral daily) Active.  Past Surgical History Tubal Ligation  Review of Systems General:Not Present- Chills, Fever, Night Sweats, Fatigue, Weight Gain, Weight Loss and Memory Loss. Skin:Not Present- Hives,  Itching, Rash, Eczema and Lesions. HEENT:Not Present- Tinnitus, Headache, Double Vision, Visual Loss, Hearing Loss and Dentures. Respiratory:Not Present- Shortness of breath with exertion, Shortness of breath at rest, Allergies, Coughing up blood and Chronic Cough. Cardiovascular:Not Present- Chest Pain, Racing/skipping heartbeats, Difficulty Breathing Lying Down, Murmur, Swelling and Palpitations. Gastrointestinal:Present- Constipation. Not Present- Bloody Stool, Heartburn, Abdominal Pain, Vomiting, Nausea, Diarrhea, Difficulty Swallowing, Jaundice and Loss of appetitie. Female Genitourinary:Not Present- Blood in Urine, Urinary frequency, Weak urinary stream, Discharge, Flank Pain, Incontinence, Painful Urination, Urgency, Urinary Retention and Urinating at Night. Musculoskeletal:Not Present- Muscle Weakness, Muscle Pain, Joint Swelling, Joint Pain, Back Pain, Morning Stiffness and Spasms. Neurological:Not Present- Tremor, Dizziness, Blackout spells, Paralysis, Difficulty with balance and Weakness. Psychiatric:Not Present- Insomnia.   Vitals 09/15/2011 2:36 PM Weight: 139 lb Height: 62 in Weight was reported by patient. Height was reported by patient. Body Surface Area: 1.66 m Body Mass Index: 25.42 kg/m Pulse: 54 (Regular) Resp.: 16 (Unlabored) BP: 122/64 (Sitting, Right Arm, Standard)  Physical Exam The physical exam findings are as follows:  General Mental Status - Alert, cooperative and good historian. General Appearance- pleasant. Not in acute distress. Orientation- Oriented X3. Build & Nutrition- Well nourished and Well developed.  Head and Neck Head- normocephalic, atraumatic . Neck Global Assessment- supple. no bruit auscultated on the right and no bruit auscultated on the left.  Eye Pupil- Bilateral- PERRLA. Motion- Bilateral- EOMI. Note: Noted to wear glasses.  Chest and Lung Exam Auscultation: Breath sounds:- clear at anterior  chest wall and - clear at posterior chest wall. Adventitious sounds:- No Adventitious sounds.  Cardiovascular Auscultation:Rhythm- Regular rate and rhythm. Heart Sounds- S1 WNL and S2 WNL. Murmurs & Other Heart Sounds:Auscultation of the heart reveals - No Murmurs.  Abdomen Palpation/Percussion:Tenderness- Abdomen is non-tender to palpation. Rigidity (guarding)- Abdomen is soft. Auscultation:Auscultation of the abdomen reveals - Bowel sounds normal.  Female Genitourinary Note: Not done, not pertinent  to present illness  Musculoskeletal  Well developed female, alert and oriented in no apparent distress. Evaluation of her left hip flexion to about 95. No internal rotation, n no external rotation and about 20 of abduction. She has pain on all attempted range of motion of the hip. Evaluation of the right hip shows flexion to 90. No internal and external rotation. No abduction.  RADIOGRAPHS: X-rays of AP pelvis and lateral of the hips and she has severe endstage bone on bone change with osteonecrosis and collapse of both femoral heads, worse on the right than the left. She has some chondral cysts, flattening of the femoral head, bone on bone change, and incongruity of the joint.  Assessment & Plan AVN (avascular necrosis of bone) (733.40) Impression: Right Hip  Note: Plan is for Right Total Hip Replacement.  Plan is to go home following her surgery with her family.  Arlee Muslim, PA-C

## 2011-09-16 ENCOUNTER — Telehealth: Payer: Self-pay | Admitting: Cardiovascular Disease

## 2011-09-16 NOTE — Telephone Encounter (Signed)
Pt aware of myoview results by phone.

## 2011-09-16 NOTE — Telephone Encounter (Signed)
Pt returning call from yesterday. Pt thinks call might be regarding stress test results. Please return pt call to discuss further.

## 2011-09-18 ENCOUNTER — Other Ambulatory Visit: Payer: Self-pay | Admitting: Orthopedic Surgery

## 2011-09-18 NOTE — H&P (Signed)
Taylor Bush DOB: 30-Nov-1945  Date of Admission:  10/03/2011  Chief Complaint:  Right Hip Pain  History of Present Illness The patient is a 65 year old female who comes in today for a preoperative History and Physical. The patient is scheduled for a right total hip arthroplasty to be performed by Dr. Dione Plover. Aluisio, MD at [facility] on 10/03/2011 . Please see the hospital record for complete dictated history and physical.  Allergies PredniSONE *CORTICOSTEROIDS*. joint aches Sulfanomides  Medication History Metoprolol Succinate (100 mg Oral two times daily) Specific dose unknown - Active. AmLODIPine Besylate (10 mg daily) Specific dose unknown - Active. Lisinopril-Hydrochlorothiazide (20-25MG  Tablet, Oral daily) Active.  Problem List/Past Medical AVN (avascular necrosis of bone) (733.40) Lumbago (724.2). 07/08/2009 Osteoarthrosis NOS, other spec site (715.98). 07/08/2009 Degeneration, lumbar/lumbosacral disc (722.52). 07/08/2009 Hypertension Impaired Vision Varicose veins Hemorrhoids  Family History Hypertension. mother and father  Social History Copy of Drug/Alcohol Rehab (Previously). no Children. 2 Current work status. retired Alcohol use. never consumed alcohol Drug/Alcohol Rehab (Currently). no Marital status. married Exercise. Exercises rarely Living situation. live with spouse Illicit drug use. no Tobacco / smoke exposure. yes Tobacco use. former smoker Pain Contract. yes Post-Surgical Plans. Plans for home after surgery  Pregnancy / Birth History Pregnant. no  Past Surgical History Tubal Ligation  Review of Systems General:Not Present- Chills, Fever, Night Sweats, Fatigue, Weight Gain, Weight Loss and Memory Loss. Skin:Not Present- Hives, Itching, Rash, Eczema and Lesions. HEENT:Not Present- Tinnitus, Headache, Double Vision, Visual Loss, Hearing Loss and Dentures. Respiratory:Not Present- Shortness  of breath with exertion, Shortness of breath at rest, Allergies, Coughing up blood and Chronic Cough. Cardiovascular:Not Present- Chest Pain, Racing/skipping heartbeats, Difficulty Breathing Lying Down, Murmur, Swelling and Palpitations. Gastrointestinal:Present- Constipation. Not Present- Bloody Stool, Heartburn, Abdominal Pain, Vomiting, Nausea, Diarrhea, Difficulty Swallowing, Jaundice and Loss of appetitie. Female Genitourinary:Not Present- Blood in Urine, Urinary frequency, Weak urinary stream, Discharge, Flank Pain, Incontinence, Painful Urination, Urgency, Urinary Retention and Urinating at Night. Musculoskeletal:Not Present- Muscle Weakness, Muscle Pain, Joint Swelling, Joint Pain, Back Pain, Morning Stiffness and Spasms. Neurological:Not Present- Tremor, Dizziness, Blackout spells, Paralysis, Difficulty with balance and Weakness. Psychiatric:Not Present- Insomnia.  Vitals Weight: 139 lb Height: 62 in Weight was reported by patient. Height was reported by patient. Body Surface Area: 1.66 m Body Mass Index: 25.42 kg/m Pulse: 54 (Regular) Resp.: 16 (Unlabored) BP: 122/64 (Sitting, Right Arm, Standard)  Physical Exam The physical exam findings are as follows:  General Mental Status - Alert, cooperative and good historian. General Appearance- pleasant. Not in acute distress. Orientation- Oriented X3. Build & Nutrition- Well nourished and Well developed.  Head and Neck Head- normocephalic, atraumatic . Neck Global Assessment- supple. no bruit auscultated on the right and no bruit auscultated on the left.  Eye Pupil- Bilateral- PERRLA. Motion- Bilateral- EOMI. Note: Noted to wear glasses.  Chest and Lung Exam Auscultation: Breath sounds:- clear at anterior chest wall and - clear at posterior chest wall. Adventitious sounds:- No Adventitious sounds.  Cardiovascular Auscultation:Rhythm- Regular rate and rhythm. Heart Sounds- S1 WNL and  S2 WNL. Murmurs & Other Heart Sounds:Auscultation of the heart reveals - No Murmurs.  Abdomen Palpation/Percussion:Tenderness- Abdomen is non-tender to palpation. Rigidity (guarding)- Abdomen is soft. Auscultation:Auscultation of the abdomen reveals - Bowel sounds normal.  Female Genitourinary Note: Not done, not pertinent to present illness  Musculoskeletal Note: Well developed female, alert and oriented in no apparent distress. Evaluation of her left hip flexion to about 95. No internal rotation, n no  external rotation and about 20 of abduction. She has pain on all attempted range of motion of the hip. Evaluation of the right hip shows flexion to 90. No internal and external rotation. No abduction.  RADIOGRAPHS: X-rays of AP pelvis and lateral of the hips and she has severe endstage bone on bone change with osteonecrosis and collapse of both femoral heads, worse on the right than the left. She has some chondral cysts, flattening of the femoral head, bone on bone change, and incongruity of the joint.  Assessment & Plan AVN (avascular necrosis of bone) Right Hip  Note: Plan is for Right Total Hip Replacement. Risks and benefits of the surgery have been discussed with the patient and they elect to proceed with surgery.  There are on active contraindications to upcoming procedure such as ongoing infection or progressive neurological disease.

## 2011-09-21 ENCOUNTER — Encounter (HOSPITAL_COMMUNITY): Payer: Self-pay

## 2011-09-22 ENCOUNTER — Other Ambulatory Visit: Payer: Self-pay | Admitting: Orthopedic Surgery

## 2011-09-22 ENCOUNTER — Ambulatory Visit (HOSPITAL_COMMUNITY)
Admission: RE | Admit: 2011-09-22 | Discharge: 2011-09-22 | Disposition: A | Discharge status: Home / Self Care | Payer: Medicare Other | Source: Ambulatory Visit | Attending: Orthopedic Surgery | Admitting: Orthopedic Surgery

## 2011-09-22 ENCOUNTER — Encounter (HOSPITAL_COMMUNITY): Payer: Self-pay

## 2011-09-22 ENCOUNTER — Encounter (HOSPITAL_COMMUNITY)
Admission: RE | Admit: 2011-09-22 | Discharge: 2011-09-22 | Payer: Medicare Other | Source: Ambulatory Visit | Attending: Orthopedic Surgery | Admitting: Orthopedic Surgery

## 2011-09-22 DIAGNOSIS — Z01812 Encounter for preprocedural laboratory examination: Secondary | ICD-10-CM | POA: Insufficient documentation

## 2011-09-22 DIAGNOSIS — M87059 Idiopathic aseptic necrosis of unspecified femur: Secondary | ICD-10-CM | POA: Insufficient documentation

## 2011-09-22 DIAGNOSIS — I10 Essential (primary) hypertension: Secondary | ICD-10-CM | POA: Insufficient documentation

## 2011-09-22 DIAGNOSIS — M47814 Spondylosis without myelopathy or radiculopathy, thoracic region: Secondary | ICD-10-CM | POA: Insufficient documentation

## 2011-09-22 HISTORY — DX: Unspecified osteoarthritis, unspecified site: M19.90

## 2011-09-22 LAB — URINALYSIS, ROUTINE W REFLEX MICROSCOPIC
Glucose, UA: NEGATIVE mg/dL
Hgb urine dipstick: NEGATIVE
Protein, ur: NEGATIVE mg/dL
Specific Gravity, Urine: 1.016 (ref 1.005–1.030)

## 2011-09-22 LAB — ABO/RH: ABO/RH(D): O POS

## 2011-09-22 LAB — COMPREHENSIVE METABOLIC PANEL
AST: 13 U/L (ref 0–37)
Albumin: 3.3 g/dL — ABNORMAL LOW (ref 3.5–5.2)
Alkaline Phosphatase: 110 U/L (ref 39–117)
CO2: 26 mEq/L (ref 19–32)
Chloride: 85 mEq/L — ABNORMAL LOW (ref 96–112)
GFR calc non Af Amer: 28 mL/min — ABNORMAL LOW (ref >90)
Potassium: 4.3 mEq/L (ref 3.5–5.1)
Total Bilirubin: 0.3 mg/dL (ref 0.3–1.2)

## 2011-09-22 LAB — CBC
HCT: 31 % — ABNORMAL LOW (ref 36.0–46.0)
Platelets: 409 10*3/uL — ABNORMAL HIGH (ref 150–400)
RBC: 3.24 MIL/uL — ABNORMAL LOW (ref 3.87–5.11)
RDW: 12.6 % (ref 11.5–15.5)
WBC: 9.1 10*3/uL (ref 4.0–10.5)

## 2011-09-22 LAB — URINE MICROSCOPIC-ADD ON

## 2011-09-22 LAB — SURGICAL PCR SCREEN: MRSA, PCR: NEGATIVE

## 2011-09-22 MED ORDER — CHLORHEXIDINE GLUCONATE 4 % EX LIQD: 60.0000 mL | Freq: Once | CUTANEOUS | Status: DC

## 2011-09-22 NOTE — Pre-Procedure Instructions (Signed)
09/14/11 Myoview in  EPIC  EKG 09/05/11 in [facility]

## 2011-09-22 NOTE — Patient Instructions (Addendum)
[place]  09/22/2011   Your procedure is scheduled on:  10/03/11  430 pm-550 pm  Report to [facility] at 230 pm   Call this number if you have problems the morning of surgery: [phone]   Remember: You may have clear liquids from 12 midnite untile 1000 am then npo.   Do not eat food:After Midnight.  Do not drink clear liquids: After Midnight.  Take these medicines the morning of surgery with A SIP OF WATER: Norvasc , Metoprolol    Do not wear jewelry, make-up or nail polish.  Do not wear lotions, powders, or perfumes.   Do not shave 48 hours prior to surgery.  Do not bring valuables to the hospital.  Contacts, dentures or bridgework may not be worn into surgery.  Leave suitcase in the car. After surgery it may be brought to your room.  For patients admitted to the hospital, checkout time is 11:00 AM the day of discharge.  Marland Kitchen    Special Instructions: CHG Shower Use Special Wash: 1/2 bottle night before surgery and 1/2 bottle morning of surgery. shower chin to toes with CHG.  Wash face and private parts with regular soap.     Please read over the following fact sheets that you were given: MRSA Information, incentive spirometry, blood transfusion fact sheet , couging and deep breathing exercises and leg exercises

## 2011-09-22 NOTE — Pre-Procedure Instructions (Signed)
09/22/11 1640 pm Called Dr Wynelle Link office and spoke with [place] . Told Faith Sodium on labs was 123 on pt .  Wynelle Cleveland I knew MD would see but I thought I would notify and told to note all other labs.

## 2011-10-02 MED ORDER — BUPIVACAINE 0.25 % ON-Q PUMP SINGLE CATH 300ML
300.0000 mL | INJECTION | Status: DC
  Filled 2011-10-02: qty 300

## 2011-10-03 ENCOUNTER — Inpatient Hospital Stay (HOSPITAL_COMMUNITY)
Admission: RE | Admit: 2011-10-03 | Discharge: 2011-10-07 | DRG: 470 | Disposition: A | Discharge status: Home Health | Payer: Medicare Other | Source: Ambulatory Visit | Attending: Orthopedic Surgery | Admitting: Orthopedic Surgery

## 2011-10-03 ENCOUNTER — Encounter (HOSPITAL_COMMUNITY)
Admission: RE | Disposition: A | Discharge status: Home Health | Payer: Self-pay | Source: Ambulatory Visit | Attending: Orthopedic Surgery

## 2011-10-03 ENCOUNTER — Encounter (HOSPITAL_COMMUNITY): Payer: Self-pay | Admitting: *Deleted

## 2011-10-03 ENCOUNTER — Inpatient Hospital Stay (HOSPITAL_COMMUNITY): Payer: Medicare Other

## 2011-10-03 ENCOUNTER — Inpatient Hospital Stay (HOSPITAL_COMMUNITY): Payer: Medicare Other | Admitting: Anesthesiology

## 2011-10-03 ENCOUNTER — Encounter (HOSPITAL_COMMUNITY): Payer: Self-pay | Admitting: Anesthesiology

## 2011-10-03 DIAGNOSIS — E871 Hypo-osmolality and hyponatremia: Secondary | ICD-10-CM | POA: Diagnosis present

## 2011-10-03 DIAGNOSIS — M8700 Idiopathic aseptic necrosis of unspecified bone: Secondary | ICD-10-CM | POA: Diagnosis present

## 2011-10-03 DIAGNOSIS — D62 Acute posthemorrhagic anemia: Secondary | ICD-10-CM | POA: Diagnosis not present

## 2011-10-03 DIAGNOSIS — M87059 Idiopathic aseptic necrosis of unspecified femur: Principal | ICD-10-CM | POA: Diagnosis present

## 2011-10-03 DIAGNOSIS — M8970 Major osseous defect, unspecified site: Secondary | ICD-10-CM | POA: Diagnosis present

## 2011-10-03 DIAGNOSIS — I10 Essential (primary) hypertension: Secondary | ICD-10-CM | POA: Diagnosis present

## 2011-10-03 HISTORY — PX: TOTAL HIP ARTHROPLASTY: SHX124

## 2011-10-03 LAB — POCT I-STAT 4, (NA,K, GLUC, HGB,HCT)
Glucose, Bld: 91 mg/dL (ref 70–99)
Potassium: 8 mEq/L (ref 3.5–5.1)

## 2011-10-03 SURGERY — ARTHROPLASTY, HIP, TOTAL,POSTERIOR APPROACH
Anesthesia: Spinal | Site: Hip | Laterality: Right | Wound class: Clean

## 2011-10-03 MED ORDER — ACETAMINOPHEN 10 MG/ML IV SOLN
1000.0000 mg | Freq: Four times a day (QID) | INTRAVENOUS | Status: AC
  Administered 2011-10-03 – 2011-10-04 (×4): 1000 mg via INTRAVENOUS
  Filled 2011-10-03 (×4): qty 100

## 2011-10-03 MED ORDER — AMLODIPINE BESYLATE 10 MG PO TABS
10.0000 mg | ORAL_TABLET | Freq: Every day | ORAL | Status: DC
  Administered 2011-10-03 – 2011-10-07 (×4): 10 mg via ORAL
  Filled 2011-10-03 (×5): qty 1

## 2011-10-03 MED ORDER — DIPHENHYDRAMINE HCL 12.5 MG/5ML PO ELIX: 12.5000 mg | ORAL_SOLUTION | ORAL | Status: DC | PRN

## 2011-10-03 MED ORDER — PHENOL 1.4 % MT LIQD
1.0000 | OROMUCOSAL | Status: DC | PRN
  Filled 2011-10-03: qty 177

## 2011-10-03 MED ORDER — MENTHOL 3 MG MT LOZG
1.0000 | LOZENGE | OROMUCOSAL | Status: DC | PRN
  Filled 2011-10-03: qty 9

## 2011-10-03 MED ORDER — CEFAZOLIN SODIUM 1-5 GM-% IV SOLN
1.0000 g | Freq: Once | INTRAVENOUS | Status: AC
  Administered 2011-10-03: 1 g via INTRAVENOUS

## 2011-10-03 MED ORDER — BISACODYL 10 MG RE SUPP: 10.0000 mg | Freq: Every day | RECTAL | Status: DC | PRN

## 2011-10-03 MED ORDER — WARFARIN SODIUM 5 MG PO TABS
5.0000 mg | ORAL_TABLET | Freq: Once | ORAL | Status: AC
  Administered 2011-10-03: 5 mg via ORAL
  Filled 2011-10-03: qty 1

## 2011-10-03 MED ORDER — METOPROLOL TARTRATE 100 MG PO TABS
100.0000 mg | ORAL_TABLET | Freq: Two times a day (BID) | ORAL | Status: DC
  Administered 2011-10-04 – 2011-10-07 (×7): 100 mg via ORAL
  Filled 2011-10-03 (×8): qty 1

## 2011-10-03 MED ORDER — BUPIVACAINE HCL (PF) 0.5 % IJ SOLN
INTRAMUSCULAR | Status: AC
  Filled 2011-10-03: qty 30

## 2011-10-03 MED ORDER — METHOCARBAMOL 500 MG PO TABS
500.0000 mg | ORAL_TABLET | Freq: Four times a day (QID) | ORAL | Status: DC | PRN
  Administered 2011-10-06: 500 mg via ORAL
  Filled 2011-10-03: qty 1

## 2011-10-03 MED ORDER — WARFARIN VIDEO
Freq: Once | Status: AC
  Administered 2011-10-03: 23:00:00
  Filled 2011-10-03: qty 1

## 2011-10-03 MED ORDER — MORPHINE SULFATE 2 MG/ML IJ SOLN
1.0000 mg | INTRAMUSCULAR | Status: DC | PRN
  Administered 2011-10-03 – 2011-10-04 (×2): 1 mg via INTRAVENOUS
  Administered 2011-10-05: 2 mg via INTRAVENOUS
  Filled 2011-10-03 (×3): qty 1

## 2011-10-03 MED ORDER — FLEET ENEMA 7-19 GM/118ML RE ENEM: 1.0000 | ENEMA | Freq: Every day | RECTAL | Status: DC | PRN

## 2011-10-03 MED ORDER — TEMAZEPAM 15 MG PO CAPS: 15.0000 mg | ORAL_CAPSULE | Freq: Every evening | ORAL | Status: DC | PRN

## 2011-10-03 MED ORDER — PATIENT'S GUIDE TO USING COUMADIN BOOK
Freq: Once | Status: AC
  Administered 2011-10-03: 23:00:00
  Filled 2011-10-03: qty 1

## 2011-10-03 MED ORDER — ONDANSETRON HCL 4 MG/2ML IJ SOLN: 4.0000 mg | Freq: Four times a day (QID) | INTRAMUSCULAR | Status: DC | PRN

## 2011-10-03 MED ORDER — BUPIVACAINE HCL (PF) 0.5 % IJ SOLN
INTRAMUSCULAR | Status: DC | PRN
  Administered 2011-10-03: 3 mL

## 2011-10-03 MED ORDER — ONDANSETRON HCL 4 MG PO TABS: 4.0000 mg | ORAL_TABLET | Freq: Four times a day (QID) | ORAL | Status: DC | PRN

## 2011-10-03 MED ORDER — LACTATED RINGERS IV SOLN
INTRAVENOUS | Status: DC
  Administered 2011-10-03: 1000 mL via INTRAVENOUS

## 2011-10-03 MED ORDER — HYDROMORPHONE HCL PF 2 MG/ML IJ SOLN
INTRAMUSCULAR | Status: AC
  Filled 2011-10-03: qty 1

## 2011-10-03 MED ORDER — CEFAZOLIN SODIUM 1-5 GM-% IV SOLN
1.0000 g | Freq: Four times a day (QID) | INTRAVENOUS | Status: AC
  Administered 2011-10-03 – 2011-10-04 (×3): 1 g via INTRAVENOUS
  Filled 2011-10-03 (×4): qty 50

## 2011-10-03 MED ORDER — PHENYLEPHRINE HCL 10 MG/ML IJ SOLN
INTRAMUSCULAR | Status: DC | PRN
  Administered 2011-10-03 (×2): 40 ug via INTRAVENOUS

## 2011-10-03 MED ORDER — PROMETHAZINE HCL 25 MG/ML IJ SOLN: 6.2500 mg | INTRAMUSCULAR | Status: DC | PRN

## 2011-10-03 MED ORDER — BISACODYL 5 MG PO TBEC
10.0000 mg | DELAYED_RELEASE_TABLET | Freq: Every day | ORAL | Status: DC | PRN
Start: 2011-10-03 — End: 2011-10-07

## 2011-10-03 MED ORDER — ACETAMINOPHEN 10 MG/ML IV SOLN
INTRAVENOUS | Status: DC | PRN
  Administered 2011-10-03: 1000 mg via INTRAVENOUS

## 2011-10-03 MED ORDER — SODIUM CHLORIDE 0.9 % IV SOLN: INTRAVENOUS | Status: DC

## 2011-10-03 MED ORDER — MEPERIDINE HCL 50 MG/ML IJ SOLN: 6.2500 mg | INTRAMUSCULAR | Status: DC | PRN

## 2011-10-03 MED ORDER — METOCLOPRAMIDE HCL 10 MG PO TABS: 5.0000 mg | ORAL_TABLET | Freq: Three times a day (TID) | ORAL | Status: DC | PRN

## 2011-10-03 MED ORDER — BUPIVACAINE LIPOSOME 1.3 % IJ SUSP
20.0000 mL | INTRAMUSCULAR | Status: AC
  Administered 2011-10-03: 20 mL
  Filled 2011-10-03: qty 20

## 2011-10-03 MED ORDER — DOCUSATE SODIUM 100 MG PO CAPS
100.0000 mg | ORAL_CAPSULE | Freq: Two times a day (BID) | ORAL | Status: DC
  Administered 2011-10-04 – 2011-10-07 (×7): 100 mg via ORAL
  Filled 2011-10-03 (×8): qty 1

## 2011-10-03 MED ORDER — METOCLOPRAMIDE HCL 5 MG/ML IJ SOLN: 5.0000 mg | Freq: Three times a day (TID) | INTRAMUSCULAR | Status: DC | PRN

## 2011-10-03 MED ORDER — MIDAZOLAM HCL 5 MG/5ML IJ SOLN
INTRAMUSCULAR | Status: DC | PRN
  Administered 2011-10-03: 2 mg via INTRAVENOUS

## 2011-10-03 MED ORDER — ACETAMINOPHEN 325 MG PO TABS: 650.0000 mg | ORAL_TABLET | Freq: Four times a day (QID) | ORAL | Status: DC | PRN

## 2011-10-03 MED ORDER — PROPOFOL 10 MG/ML IV EMUL
INTRAVENOUS | Status: DC | PRN
  Administered 2011-10-03: 50 ug/kg/min via INTRAVENOUS

## 2011-10-03 MED ORDER — ACETAMINOPHEN 650 MG RE SUPP: 650.0000 mg | Freq: Four times a day (QID) | RECTAL | Status: DC | PRN

## 2011-10-03 MED ORDER — LACTATED RINGERS IV SOLN: INTRAVENOUS | Status: DC

## 2011-10-03 MED ORDER — HYDROMORPHONE HCL PF 2 MG/ML IJ SOLN
0.2500 mg | INTRAMUSCULAR | Status: DC | PRN
  Administered 2011-10-03 (×2): 0.5 mg via INTRAVENOUS

## 2011-10-03 MED ORDER — OXYCODONE HCL 5 MG PO TABS
5.0000 mg | ORAL_TABLET | ORAL | Status: DC | PRN
  Administered 2011-10-03 – 2011-10-04 (×3): 10 mg via ORAL
  Administered 2011-10-04 (×2): 5 mg via ORAL
  Administered 2011-10-04 – 2011-10-07 (×19): 10 mg via ORAL
  Filled 2011-10-03 (×7): qty 2
  Filled 2011-10-03: qty 1
  Filled 2011-10-03 (×2): qty 2
  Filled 2011-10-03: qty 1
  Filled 2011-10-03 (×13): qty 2

## 2011-10-03 MED ORDER — SODIUM CHLORIDE 0.9 % IV SOLN
INTRAVENOUS | Status: DC
  Administered 2011-10-03: 20:00:00 via INTRAVENOUS

## 2011-10-03 MED ORDER — ACETAMINOPHEN 10 MG/ML IV SOLN
INTRAVENOUS | Status: AC
  Filled 2011-10-03: qty 100

## 2011-10-03 MED ORDER — MAGNESIUM HYDROXIDE 400 MG/5ML PO SUSP: 30.0000 mL | Freq: Two times a day (BID) | ORAL | Status: DC | PRN

## 2011-10-03 MED ORDER — SODIUM CHLORIDE 0.9 % IV SOLN
INTRAVENOUS | Status: DC | PRN
  Administered 2011-10-03 (×5): via INTRAVENOUS

## 2011-10-03 MED ORDER — METHOCARBAMOL 100 MG/ML IJ SOLN
500.0000 mg | Freq: Four times a day (QID) | INTRAVENOUS | Status: DC | PRN
  Filled 2011-10-03: qty 5

## 2011-10-03 MED ORDER — POLYETHYLENE GLYCOL 3350 17 G PO PACK
17.0000 g | PACK | Freq: Every day | ORAL | Status: DC | PRN
  Filled 2011-10-03: qty 1

## 2011-10-03 SURGICAL SUPPLY — 48 items
BAG SPEC THK2 15X12 ZIP CLS (MISCELLANEOUS) ×1
BAG ZIPLOCK 12X15 (MISCELLANEOUS) ×2 IMPLANT
BIT DRILL 2.8X128 (BIT) ×2 IMPLANT
BLADE EXTENDED COATED 6.5IN (ELECTRODE) ×2 IMPLANT
BLADE SAW SAG 73X25 THK (BLADE) ×1
BLADE SAW SGTL 73X25 THK (BLADE) ×1 IMPLANT
CATH KIT ON-Q SILVERSOAK 5 (CATHETERS) IMPLANT
CATH KIT ON-Q SILVERSOAK 5IN (CATHETERS) IMPLANT
CLOTH BEACON ORANGE TIMEOUT ST (SAFETY) ×2 IMPLANT
DRAPE INCISE IOBAN 66X45 STRL (DRAPES) ×2 IMPLANT
DRAPE ORTHO SPLIT 77X108 STRL (DRAPES) ×4
DRAPE POUCH INSTRU U-SHP 10X18 (DRAPES) ×2 IMPLANT
DRAPE SURG ORHT 6 SPLT 77X108 (DRAPES) ×2 IMPLANT
DRAPE U-SHAPE 47X51 STRL (DRAPES) ×2 IMPLANT
DRESSING ALLEVYN BORDER HEEL (GAUZE/BANDAGES/DRESSINGS) ×1 IMPLANT
DRSG ADAPTIC 3X8 NADH LF (GAUZE/BANDAGES/DRESSINGS) ×2 IMPLANT
DRSG MEPILEX BORDER 4X4 (GAUZE/BANDAGES/DRESSINGS) ×3 IMPLANT
DURAPREP 26ML APPLICATOR (WOUND CARE) ×2 IMPLANT
ELECT REM PT RETURN 9FT ADLT (ELECTROSURGICAL) ×2
ELECTRODE REM PT RTRN 9FT ADLT (ELECTROSURGICAL) ×1 IMPLANT
EVACUATOR 1/8 PVC DRAIN (DRAIN) ×2 IMPLANT
FACESHIELD LNG OPTICON STERILE (SAFETY) ×8 IMPLANT
GAUZE SPONGE 4X4 12PLY STRL LF (GAUZE/BANDAGES/DRESSINGS) ×1 IMPLANT
GLOVE BIO SURGEON STRL SZ7.5 (GLOVE) ×2 IMPLANT
GLOVE BIO SURGEON STRL SZ8 (GLOVE) ×2 IMPLANT
GLOVE BIOGEL PI IND STRL 8 (GLOVE) ×2 IMPLANT
GLOVE BIOGEL PI INDICATOR 8 (GLOVE) ×2
GOWN PREVENTION PLUS XLARGE (GOWN DISPOSABLE) ×2 IMPLANT
GOWN STRL REIN XL XLG (GOWN DISPOSABLE) ×2 IMPLANT
IMMOBILIZER KNEE 20 (SOFTGOODS)
IMMOBILIZER KNEE 20 THIGH 36 (SOFTGOODS) IMPLANT
KIT BASIN OR (CUSTOM PROCEDURE TRAY) ×2 IMPLANT
MANIFOLD NEPTUNE II (INSTRUMENTS) ×2 IMPLANT
NS IRRIG 1000ML POUR BTL (IV SOLUTION) ×2 IMPLANT
PACK TOTAL JOINT (CUSTOM PROCEDURE TRAY) ×2 IMPLANT
PASSER SUT SWANSON 36MM LOOP (INSTRUMENTS) ×2 IMPLANT
POSITIONER SURGICAL ARM (MISCELLANEOUS) ×2 IMPLANT
SPONGE GAUZE 4X4 12PLY (GAUZE/BANDAGES/DRESSINGS) ×2 IMPLANT
STRIP CLOSURE SKIN 1/2X4 (GAUZE/BANDAGES/DRESSINGS) ×4 IMPLANT
SUT ETHIBOND NAB CT1 #1 30IN (SUTURE) ×4 IMPLANT
SUT MNCRL AB 4-0 PS2 18 (SUTURE) ×2 IMPLANT
SUT VIC AB 1 CT1 27 (SUTURE) ×6
SUT VIC AB 1 CT1 27XBRD ANTBC (SUTURE) ×3 IMPLANT
SUT VIC AB 2-0 CT1 27 (SUTURE) ×6
SUT VIC AB 2-0 CT1 TAPERPNT 27 (SUTURE) ×3 IMPLANT
TOWEL OR 17X26 10 PK STRL BLUE (TOWEL DISPOSABLE) ×4 IMPLANT
TRAY FOLEY CATH 14FRSI W/METER (CATHETERS) ×2 IMPLANT
WATER STERILE IRR 1500ML POUR (IV SOLUTION) ×2 IMPLANT

## 2011-10-03 NOTE — Preoperative (Signed)
Beta Blockers   Reason not to administer Beta Blockers:Pt took Lopreppor 10-03-11 at 0700

## 2011-10-03 NOTE — Anesthesia Procedure Notes (Signed)
Spinal  Patient location during procedure: OR Staffing Performed by: anesthesiologist  Preanesthetic Checklist Completed: patient identified, site marked, surgical consent, pre-op evaluation, timeout performed, IV checked, risks and benefits discussed and monitors and equipment checked Spinal Block Patient position: sitting Prep: Betadine Patient monitoring: heart rate, continuous pulse ox and blood pressure Location: L3-4 Injection technique: single-shot Needle Needle type: Spinocan  Needle gauge: 22 G Needle length: 9 cm Assessment Sensory level: T10 Additional Notes Expiration date of kit checked and confirmed. Patient tolerated procedure well, without complications.

## 2011-10-03 NOTE — Interval H&P Note (Signed)
History and Physical Interval Note:   10/03/2011   4:18 PM   Taylor Bush  has presented today for surgery, with the diagnosis of avascular necrosis right hip  The various methods of treatment have been discussed with the patient and family. After consideration of risks, benefits and other options for treatment, the patient has consented to  Procedure(s): TOTAL HIP ARTHROPLASTY as a surgical intervention .  The patients' history has been reviewed, patient examined, no change in status, stable for surgery.  I have reviewed the patients' chart and labs.  Questions were answered to the patient's satisfaction.     Gearlean Alf  MD

## 2011-10-03 NOTE — Anesthesia Postprocedure Evaluation (Signed)
  Anesthesia Post-op Note  Patient: Taylor Bush  Procedure(s) Performed:  TOTAL HIP ARTHROPLASTY  Patient Location: PACU  Anesthesia Type: Spinal  Level of Consciousness: awake and alert   Airway and Oxygen Therapy: Patient Spontanous Breathing  Post-op Pain: mild  Post-op Assessment: Post-op Vital signs reviewed, Patient's Cardiovascular Status Stable, Respiratory Function Stable, Patent Airway and No signs of Nausea or vomiting  Post-op Vital Signs: stable  Complications: No apparent anesthesia complications

## 2011-10-03 NOTE — H&P (View-Only) (Signed)
Taylor Bush DOB: 01-05-1946  Date of Admission:  10/03/2011  Chief Complaint:  Right Hip Pain  History of Present Illness The patient is a 65 year old female who comes in today for a preoperative History and Physical. The patient is scheduled for a right total hip arthroplasty to be performed by Dr. Dione Plover. Aluisio, MD at [facility] on 10/03/2011 . Please see the hospital record for complete dictated history and physical.  Allergies PredniSONE *CORTICOSTEROIDS*. joint aches Sulfanomides  Medication History Metoprolol Succinate (100 mg Oral two times daily) Specific dose unknown - Active. AmLODIPine Besylate (10 mg daily) Specific dose unknown - Active. Lisinopril-Hydrochlorothiazide (20-25MG  Tablet, Oral daily) Active.  Problem List/Past Medical AVN (avascular necrosis of bone) (733.40) Lumbago (724.2). 07/08/2009 Osteoarthrosis NOS, other spec site (715.98). 07/08/2009 Degeneration, lumbar/lumbosacral disc (722.52). 07/08/2009 Hypertension Impaired Vision Varicose veins Hemorrhoids  Family History Hypertension. mother and father  Social History Copy of Drug/Alcohol Rehab (Previously). no Children. 2 Current work status. retired Alcohol use. never consumed alcohol Drug/Alcohol Rehab (Currently). no Marital status. married Exercise. Exercises rarely Living situation. live with spouse Illicit drug use. no Tobacco / smoke exposure. yes Tobacco use. former smoker Pain Contract. yes Post-Surgical Plans. Plans for home after surgery  Pregnancy / Birth History Pregnant. no  Past Surgical History Tubal Ligation  Review of Systems General:Not Present- Chills, Fever, Night Sweats, Fatigue, Weight Gain, Weight Loss and Memory Loss. Skin:Not Present- Hives, Itching, Rash, Eczema and Lesions. HEENT:Not Present- Tinnitus, Headache, Double Vision, Visual Loss, Hearing Loss and Dentures. Respiratory:Not Present- Shortness  of breath with exertion, Shortness of breath at rest, Allergies, Coughing up blood and Chronic Cough. Cardiovascular:Not Present- Chest Pain, Racing/skipping heartbeats, Difficulty Breathing Lying Down, Murmur, Swelling and Palpitations. Gastrointestinal:Present- Constipation. Not Present- Bloody Stool, Heartburn, Abdominal Pain, Vomiting, Nausea, Diarrhea, Difficulty Swallowing, Jaundice and Loss of appetitie. Female Genitourinary:Not Present- Blood in Urine, Urinary frequency, Weak urinary stream, Discharge, Flank Pain, Incontinence, Painful Urination, Urgency, Urinary Retention and Urinating at Night. Musculoskeletal:Not Present- Muscle Weakness, Muscle Pain, Joint Swelling, Joint Pain, Back Pain, Morning Stiffness and Spasms. Neurological:Not Present- Tremor, Dizziness, Blackout spells, Paralysis, Difficulty with balance and Weakness. Psychiatric:Not Present- Insomnia.  Vitals Weight: 139 lb Height: 62 in Weight was reported by patient. Height was reported by patient. Body Surface Area: 1.66 m Body Mass Index: 25.42 kg/m Pulse: 54 (Regular) Resp.: 16 (Unlabored) BP: 122/64 (Sitting, Right Arm, Standard)  Physical Exam The physical exam findings are as follows:  General Mental Status - Alert, cooperative and good historian. General Appearance- pleasant. Not in acute distress. Orientation- Oriented X3. Build & Nutrition- Well nourished and Well developed.  Head and Neck Head- normocephalic, atraumatic . Neck Global Assessment- supple. no bruit auscultated on the right and no bruit auscultated on the left.  Eye Pupil- Bilateral- PERRLA. Motion- Bilateral- EOMI. Note: Noted to wear glasses.  Chest and Lung Exam Auscultation: Breath sounds:- clear at anterior chest wall and - clear at posterior chest wall. Adventitious sounds:- No Adventitious sounds.  Cardiovascular Auscultation:Rhythm- Regular rate and rhythm. Heart Sounds- S1 WNL and  S2 WNL. Murmurs & Other Heart Sounds:Auscultation of the heart reveals - No Murmurs.  Abdomen Palpation/Percussion:Tenderness- Abdomen is non-tender to palpation. Rigidity (guarding)- Abdomen is soft. Auscultation:Auscultation of the abdomen reveals - Bowel sounds normal.  Female Genitourinary Note: Not done, not pertinent to present illness  Musculoskeletal Note: Well developed female, alert and oriented in no apparent distress. Evaluation of her left hip flexion to about 95. No internal rotation, n no  external rotation and about 20 of abduction. She has pain on all attempted range of motion of the hip. Evaluation of the right hip shows flexion to 90. No internal and external rotation. No abduction.  RADIOGRAPHS: X-rays of AP pelvis and lateral of the hips and she has severe endstage bone on bone change with osteonecrosis and collapse of both femoral heads, worse on the right than the left. She has some chondral cysts, flattening of the femoral head, bone on bone change, and incongruity of the joint.  Assessment & Plan AVN (avascular necrosis of bone) Right Hip  Note: Plan is for Right Total Hip Replacement. Risks and benefits of the surgery have been discussed with the patient and they elect to proceed with surgery.  There are on active contraindications to upcoming procedure such as ongoing infection or progressive neurological disease.

## 2011-10-03 NOTE — Progress Notes (Signed)
ANTICOAGULATION CONSULT NOTE - Initial Consult  Pharmacy Consult for Coumadin Indication: VTE prophylaxis  Allergies  Allergen Reactions  . Statins     Patient Measurements: Height: 5\' 3"  (160 cm) Weight: 139 lb (63.05 kg) IBW/kg (Calculated) : 52.4    Vital Signs: Temp: 97.7 F (36.5 C) (11/26 2146) Temp src: Oral (11/26 1347) BP: 143/74 mmHg (11/26 2146) Pulse Rate: 62  (11/26 2146)  Labs:  Basename 10/03/11 1616  HGB 11.2*  HCT 33.0*  PLT --  APTT --  LABPROT --  INR --  HEPARINUNFRC --  CREATININE --  CKTOTAL --  CKMB --  TROPONINI --   Estimated Creatinine Clearance: 27.3 ml/min (by C-G formula based on Cr of 1.84).  Medical History: Past Medical History  Diagnosis Date  . Bilateral leg pain   . HTN (hypertension)   . HLD (hyperlipidemia)   . Fatigue   . Lumbar back pain     with radiculopathy  . Rash     nonvesicular  . Routine general medical examination at a health care facility   . Screening for lipoid disorders   . Cataract   . Open-angle glaucoma   . PAD (peripheral artery disease)     with intermittent claudication, ABI 0.72 on Rt and 0.73 on L when assessed in 2010.   Marland Kitchen Arthritis     bilateral hips     Medications:  Prescriptions prior to admission  Medication Sig Dispense Refill  . amLODipine (NORVASC) 10 MG tablet Take 1 tablet (10 mg total) by mouth daily.  30 tablet  11  . HYDROcodone-acetaminophen (NORCO) 5-325 MG per tablet Take 1 tablet by mouth every 6 (six) hours as needed.        Marland Kitchen lisinopril-hydrochlorothiazide (PRINZIDE,ZESTORETIC) 20-25 MG per tablet Take 1 tablet by mouth every morning.       . metoprolol (LOPRESSOR) 100 MG tablet Take 1 tablet (100 mg total) by mouth 2 (two) times daily.  60 tablet  11    Assessment: 65 y/o female patient admitted s/p R THA, requiring anticoagulation for VTE px.   Goal of Therapy:  INR 2-3   Plan:  Coumadin 5mg  po today, daily protime, and provide coumadin education.  Davonna Belling, PharmD, BCPS Pager [phone]  10/03/2011,10:08 PM

## 2011-10-03 NOTE — Anesthesia Preprocedure Evaluation (Addendum)
Anesthesia Evaluation  Patient identified by MRN, date of birth, ID band Patient awake    Reviewed: Allergy & Precautions, H&P , NPO status , Patient's Chart, lab work & pertinent test results  Airway Mallampati: II TM Distance: >3 FB Neck ROM: Full    Dental No notable dental hx.    Pulmonary neg pulmonary ROS,  clear to auscultation  Pulmonary exam normal       Cardiovascular hypertension, Pt. on medications neg cardio ROS Regular Normal    Neuro/Psych Negative Neurological ROS  Negative Psych ROS   GI/Hepatic negative GI ROS, Neg liver ROS,   Endo/Other  Negative Endocrine ROS  Renal/GU negative Renal ROS  Genitourinary negative   Musculoskeletal negative musculoskeletal ROS (+)   Abdominal   Peds negative pediatric ROS (+)  Hematology negative hematology ROS (+)   Anesthesia Other Findings   Reproductive/Obstetrics negative OB ROS                          Anesthesia Physical Anesthesia Plan  ASA: II  Anesthesia Plan: General and Spinal   Post-op Pain Management:    Induction:   Airway Management Planned:   Additional Equipment:   Intra-op Plan:   Post-operative Plan: Extubation in OR  Informed Consent: I have reviewed the patients History and Physical, chart, labs and discussed the procedure including the risks, benefits and alternatives for the proposed anesthesia with the patient or authorized representative who has indicated his/her understanding and acceptance.   Dental advisory given  Plan Discussed with: CRNA  Anesthesia Plan Comments: (Repeat istat Na+ 127. )       Anesthesia Quick Evaluation

## 2011-10-03 NOTE — Transfer of Care (Signed)
Immediate Anesthesia Transfer of Care Note  Patient: Taylor Bush  Procedure(s) Performed:  TOTAL HIP ARTHROPLASTY  Patient Location: PACU  Anesthesia Type: Regional  Level of Consciousness: awake and alert   Airway & Oxygen Therapy: Patient Spontanous Breathing and Patient connected to face mask oxygen  Post-op Assessment: Report given to PACU RN and Post -op Vital signs reviewed and stable  Post vital signs: Reviewed and stable  Complications: No apparent anesthesia complications

## 2011-10-03 NOTE — Op Note (Signed)
Pre-operative diagnosis- Osteoarthritis Right hip  Post-operative diagnosis- Osteoarthritis  Right hip  Procedure-  RightTotal Hip Arthroplasty  Surgeon- Dione Plover. Yaffa Seckman, MD  Assistant- Arlee Muslim, PA-C   Anesthesia  Spinal  EBL- 100   Drain Hemovac   Complication- None  Condition-PACU - hemodynamically stable.   Brief Clinical Note-  Taylor Bush is a 65 y.o. female with end stage arthritis of her right hip with progressively worsening pain and dysfunction. Pain occurs with activity and rest including pain at night. She has tried analgesics, protected weight bearing and rest without benefit. Pain is too severe to attempt physical therapy. Radiographs demonstrate bone on bone arthritis with subchondral cyst formation and collapse of the femoral head. She presents now for right THA.  Procedure in detail-   The patient is brought into the operating room and placed on the operating table. After successful administration of Spinal   anesthesia, the patient is placed in the  Left lateral decubitus position with the  Right side up and held in place with the hip positioner. The lower extremity is isolated from the perineum with plastic drapes and time-out is performed by the surgical team. The lower extremity is then prepped and draped in the usual sterile fashion. A short posterolateral incision is made with a ten blade through the subcutaneous tissue to the level of the fascia lata which is incised in line with the skin incision. The sciatic nerve is palpated and protected and the short external rotators and capsule are isolated from the femur. The hip is then dislocated and the center of the femoral head is marked. A trial prosthesis is placed such that the trial head corresponds to the center of the patients' native femoral head. The resection level is marked on the femoral neck and the resection is made with an oscillating saw. The femoral head is removed and femoral retractors placed to gain  access to the femoral canal.      The canal finder is passed into the femoral canal and the canal is thoroughly irrigated with sterile saline to remove the fatty contents. Axial reaming is performed to 11.5  mm, proximal reaming to 52F   and the sleeve machined to a small. A 52F small trial sleeve is placed into the proximal femur.      The femur is then retracted anteriorly to gain acetabular exposure. Acetabular retractors are placed and the labrum and osteophytes are removed, Acetabular reaming is performed to 49  mm and a 50  mm Pinnacle acetabular shell is placed in anatomic position with excellent purchase. Additional dome screws were placed. An apex hole eliminator is placed and the permanent 32 mm neutral plus 4 Marathon liner is placed into the acetabular shell.      The trial femur is then placed into the femoral canal. The size is 16 x 11  stem with a 36 + 6  neck and a 32 + 6 head with the neck version matching  the patients' native anteversion. The hip is reduced with excellent stability with full extension and full external rotation, 70 degrees flexion with 40 degrees adduction and 90 degrees internal rotation and 90 degrees of flexion with 70 degrees of internal rotation. The operative leg is placed on top of the non-operative leg and the leg lengths are found to be equal. The trials are then removed and the permanent implant of the same size is impacted into the femoral canal. The ceramic femoral head of the same size as the  trial is placed and the hip is reduced with the same stability parameters. The operative leg is again placed on top of the non-operative leg and the leg lengths are found to be equal.      The wound is then copiously irrigated with saline solution and the capsule and short external rotators are re-attached to the femur through drill holes with Ethibond suture. The fascia lata is closed over a hemovac drain with #1 vicryl suture and the fascia lata, gluteal muscles and  subcutaneous tissues are injected with Exparel 96ml diluted with saline 73ml. The subcutaneous tissues are closed with #1 and2-0 vicryl and the subcuticular layer closed with running 4-0 Monocryl. The drain is hooked to suction, incision cleaned and dried, and steri-srips and a bulky sterile dressing applied. The limb is placed into a knee immobilizer and the patient is awakened and transported to recovery in stable condition.      Please note that a surgical assistant was a medical necessity for this procedure in order to perform it in a safe and expeditious manner. The assistant was necessary to provide retraction to the vital neurovascular structures and to retract and position the limb to allow for anatomic placement of the prosthetic components.  Dione Plover Taylor Perrier, MD    10/03/2011, 5:54 PM

## 2011-10-04 DIAGNOSIS — E871 Hypo-osmolality and hyponatremia: Secondary | ICD-10-CM | POA: Diagnosis present

## 2011-10-04 DIAGNOSIS — M8700 Idiopathic aseptic necrosis of unspecified bone: Secondary | ICD-10-CM | POA: Diagnosis present

## 2011-10-04 DIAGNOSIS — D62 Acute posthemorrhagic anemia: Secondary | ICD-10-CM | POA: Diagnosis not present

## 2011-10-04 LAB — BASIC METABOLIC PANEL
BUN: 21 mg/dL (ref 6–23)
Creatinine, Ser: 1.17 mg/dL — ABNORMAL HIGH (ref 0.50–1.10)
GFR calc Af Amer: 55 mL/min — ABNORMAL LOW (ref >90)
GFR calc non Af Amer: 48 mL/min — ABNORMAL LOW (ref >90)
Glucose, Bld: 105 mg/dL — ABNORMAL HIGH (ref 70–99)
Potassium: 4.3 mEq/L (ref 3.5–5.1)

## 2011-10-04 LAB — CBC
HCT: 23.9 % — ABNORMAL LOW (ref 36.0–46.0)
Hemoglobin: 8.3 g/dL — ABNORMAL LOW (ref 12.0–15.0)
MCH: 33.5 pg (ref 26.0–34.0)
MCHC: 33.9 g/dL (ref 30.0–36.0)
MCV: 98.8 fL (ref 78.0–100.0)
RDW: 13 % (ref 11.5–15.5)

## 2011-10-04 LAB — PROTIME-INR: INR: 0.97 (ref 0.00–1.49)

## 2011-10-04 MED ORDER — SODIUM CHLORIDE 0.9 % IV SOLN
INTRAVENOUS | Status: DC
  Administered 2011-10-04: 13:00:00 via INTRAVENOUS

## 2011-10-04 MED ORDER — FUROSEMIDE 10 MG/ML IJ SOLN
10.0000 mg | Freq: Once | INTRAMUSCULAR | Status: AC
  Administered 2011-10-04: 10 mg via INTRAVENOUS
  Filled 2011-10-04: qty 1

## 2011-10-04 MED ORDER — POLYSACCHARIDE IRON 150 MG PO CAPS
150.0000 mg | ORAL_CAPSULE | Freq: Every day | ORAL | Status: DC
  Administered 2011-10-04 – 2011-10-07 (×4): 150 mg via ORAL
  Filled 2011-10-04 (×4): qty 1

## 2011-10-04 MED ORDER — WARFARIN SODIUM 5 MG PO TABS
5.0000 mg | ORAL_TABLET | Freq: Once | ORAL | Status: AC
  Administered 2011-10-04: 5 mg via ORAL
  Filled 2011-10-04: qty 1

## 2011-10-04 NOTE — Plan of Care (Signed)
Problem: Consults Goal: Diagnosis- Total Joint Replacement Primary Total Hip Right     

## 2011-10-04 NOTE — Progress Notes (Signed)
Physical Therapy Evaluation Patient Details Name: Taylor Bush MRN: [id number] DOB: 1946/07/17 Today's Date: 10/04/2011 [id number] Ev2  Problem List:  Patient Active Problem List  Diagnoses  . HYPERLIPIDEMIA  . UNSPECIFIED OPEN-ANGLE GLAUCOMA  . UNSPECIFIED CATARACT  . HYPERTENSION  . ATHEROSCLEROSIS W /INT CLAUDICATION  . BACK PAIN, LUMBAR, WITH RADICULOPATHY  . LEG PAIN, BILATERAL  . FATIGUE  . RASH-NONVESICULAR  . Preoperative evaluation of a medical condition to rule out surgical contraindications (TAR required)  . Acute blood loss anemia    Past Medical History:  Past Medical History  Diagnosis Date  . Bilateral leg pain   . HTN (hypertension)   . HLD (hyperlipidemia)   . Fatigue   . Lumbar back pain     with radiculopathy  . Rash     nonvesicular  . Routine general medical examination at a health care facility   . Screening for lipoid disorders   . Cataract   . Open-angle glaucoma   . PAD (peripheral artery disease)     with intermittent claudication, ABI 0.72 on Rt and 0.73 on L when assessed in 2010.   Marland Kitchen Arthritis     bilateral hips    Past Surgical History:  Past Surgical History  Procedure Date  . Cataract surgery 2/10  . Abnormal vascular studies 7/10    ABI's 70, R SFA > 50%^ blockage   . Other surgical history     ganglion cyst removed left wrist     PT Assessment/Plan/Recommendation PT Assessment Clinical Impression Statement: Patient s/p right THA presents with decreased ROM/strength, decresased tolerance to activity, decreased weight bearing, decreased mobility with acute pain and will benefit from skilled PT to maximize independence and allow d/c home with spouse/son assist and HHPT. PT Recommendation/Assessment: Patient will need skilled PT in the acute care venue PT Problem List: Decreased range of motion;Decreased strength;Decreased activity tolerance;Decreased mobility;Pain Barriers to Discharge: None PT Therapy Diagnosis : Difficulty  walking;Acute pain PT Plan PT Frequency: 7X/week PT Treatment/Interventions: Therapeutic exercise;Balance training;Functional mobility training;Patient/family education;Stair training;Gait training;DME instruction PT Recommendation Follow Up Recommendations: Home health PT Equipment Recommended: None recommended by PT PT Goals  Acute Rehab PT Goals PT Goal Formulation: With patient Time For Goal Achievement: 5 days Pt will go Supine/Side to Sit: with min assist PT Goal: Supine/Side to Sit - Progress: Progressing toward goal Pt will go Sit to Supine/Side: with supervision PT Goal: Sit to Supine/Side - Progress: Progressing toward goal Pt will Transfer Sit to Stand/Stand to Sit: with supervision PT Transfer Goal: Sit to Stand/Stand to Sit - Progress: Progressing toward goal Pt will Ambulate: 51 - 150 feet;with supervision;with rolling walker PT Goal: Ambulate - Progress: Progressing toward goal Pt will Go Up / Down Stairs: 3-5 stairs;with min assist;with least restrictive assistive device PT Goal: Up/Down Stairs - Progress: Progressing toward goal Pt will Perform Home Exercise Program: with supervision, verbal cues required/provided PT Goal: Perform Home Exercise Program - Progress: Progressing toward goal  PT Evaluation Precautions/Restrictions  Precautions Precautions: Posterior Hip Restrictions Weight Bearing Restrictions: Yes RLE Weight Bearing: Partial weight bearing RLE Partial Weight Bearing Percentage or Pounds: 25-50% Prior Functioning  Home Living Lives With: Spouse Type of Home: House Home Layout: One level Home Access: Stairs to enter Entrance Stairs-Rails: Left Entrance Stairs-Number of Steps: 4 Bathroom Shower/Tub: Event organiser: Shower chair with back Prior Function Level of Independence: Requires assistive device for independence;Independent with gait;Independent with basic ADLs;Independent with  transfers Cognition Cognition Arousal/Alertness: Awake/alert Overall Cognitive Status:  Appears within functional limits for tasks assessed Orientation Level: Oriented X4 Sensation/Coordination   Extremity Assessment RLE Assessment RLE Assessment: Exceptions to [facility] RLE AROM (degrees) RLE Overall AROM Comments: AROM hip not tested in supine, tolerates for sitting EOB at approx 50-60 degrees hip flexion, ankle WFL RLE Strength RLE Overall Strength: Deficits RLE Overall Strength Comments: positive quad set and ankle WFL LLE Assessment LLE Assessment: Exceptions to WFL LLE AROM (degrees) Overall AROM Left Lower Extremity: Within functional limits for tasks assessed LLE Overall AROM Comments: but complains of pain opposite leg LLE Strength LLE Overall Strength: Due to pain LLE Overall Strength Comments: at least 4/5 knee extension, but with pain on right LE Mobility (including Balance) Bed Mobility Bed Mobility: Yes Supine to Sit: 3: Mod assist Supine to Sit Details (indicate cue type and reason): cues for technique and assist for scooting Sitting - Scoot to Edge of Bed: 4: Min assist Sitting - Scoot to [employer] of Bed Details (indicate cue type and reason): cues for hand placement Transfers Transfers: Yes Sit to Stand: 1: +2 Total assist;From bed;With upper extremity assist Sit to Stand Details (indicate cue type and reason): pt=50%, max cues for technique and precautions Stand to Sit: With upper extremity assist;3: Mod assist Stand to Sit Details: max cues for technique and precautions Ambulation/Gait Ambulation/Gait: Yes Ambulation/Gait Assistance: 3: Mod assist Ambulation/Gait Assistance Details (indicate cue type and reason): cues for sequence, PWB Ambulation Distance (Feet): 10 Feet Assistive device: Rolling walker Gait Pattern: Antalgic    Exercise  Total Joint Exercises Ankle Circles/Pumps: AROM;Both;10 reps;Supine Quad Sets: AROM;Both;10 reps;Supine End of Session PT -  End of Session Equipment Utilized During Treatment: Gait belt Activity Tolerance: Patient limited by pain Patient left: in chair Nurse Communication: Other (comment) (when pain meds due again for PM session planning) General Behavior During Session: [facility] for tasks performed Cognition: [facility] for tasks performed  [facility] 10/04/2011, 1:33 PM

## 2011-10-04 NOTE — Progress Notes (Signed)
ANTICOAGULATION CONSULT NOTE - Follow Up Consult  Pharmacy Consult for Coumadin Indication: VTE prophylaxis  Patient Measurements: Height: 5\' 3"  (160 cm) Weight: 139 lb (63.05 kg) IBW/kg (Calculated) : 52.4  Adjusted Body Weight:   Vital Signs: Temp: 98.7 F (37.1 C) (11/27 1110) Temp src: Oral (11/27 1110) BP: 114/71 mmHg (11/27 1110) Pulse Rate: 64  (11/27 1110)  Labs:  Basename 10/04/11 0415 10/03/11 1616  HGB 8.3* 11.2*  HCT 23.9* 33.0*  PLT 275 --  APTT -- --  LABPROT 13.1 --  INR 0.97 --  HEPARINUNFRC -- --  CREATININE 1.17* --  CKTOTAL -- --  CKMB -- --  TROPONINI -- --   Estimated Creatinine Clearance: 42.9 ml/min (by C-G formula based on Cr of 1.17).  Assessment: 70 yr F s/p R THA No increase of INR with first dose 5mg , too early for response.  Goal of Therapy:  INR 2-3   Plan:  Repeat 5mg  Coumadin today Follow daily protimes.  Nyoka Cowden, Shaquna Geigle L 10/04/2011,1:15 PM

## 2011-10-04 NOTE — Progress Notes (Signed)
Physical Therapy Treatment Patient Details Name: Taylor Bush MRN: [id number] DOB: Sep 03, 1946 Today's Date: 10/04/2011 [id number] 1Ta  PT Assessment/Plan  PT - Assessment/Plan Comments on Treatment Session: Patient moving well, but limited by pain.  Pre-medicated shortly before treatment (had asked nurse after am treatment what time meds were due again to schedule pm visit.) PT Plan: Discharge plan remains appropriate PT Frequency: 7X/week Follow Up Recommendations: Home health PT Equipment Recommended: None recommended by PT PT Goals  Acute Rehab PT Goals PT Goal Formulation: With patient Time For Goal Achievement: 5 days Pt will go Supine/Side to Sit: with min assist PT Goal: Supine/Side to Sit - Progress: Progressing toward goal Pt will go Sit to Supine/Side: with supervision PT Goal: Sit to Supine/Side - Progress: Progressing toward goal Pt will Transfer Sit to Stand/Stand to Sit: with supervision PT Transfer Goal: Sit to Stand/Stand to Sit - Progress: Progressing toward goal Pt will Ambulate: 51 - 150 feet;with supervision;with rolling walker PT Goal: Ambulate - Progress: Progressing toward goal Pt will Go Up / Down Stairs: 3-5 stairs;with min assist;with least restrictive assistive device PT Goal: Up/Down Stairs - Progress: Progressing toward goal Pt will Perform Home Exercise Program: with supervision, verbal cues required/provided PT Goal: Perform Home Exercise Program - Progress: Progressing toward goal  PT Treatment Precautions/Restrictions  Precautions Precautions: Posterior Hip Restrictions Weight Bearing Restrictions: Yes RLE Weight Bearing: Partial weight bearing RLE Partial Weight Bearing Percentage or Pounds: 25-50% Mobility (including Balance) Bed Mobility Bed Mobility: Yes Sit to Supine - Left: 3: Mod assist Sit to Supine - Left Details (indicate cue type and reason): cues for technique and assist for both LE's Transfers Transfers: Yes Sit to Stand: 4:  Min assist;From chair/3-in-1;With upper extremity assist;With armrests Sit to Stand Details (indicate cue type and reason): cues for technique and precautions Stand to Sit: 4: Min assist;To bed;With upper extremity assist Stand to Sit Details: cues for precautions Ambulation/Gait Ambulation/Gait: No (patient refused due to pain)   Exercise  Total Joint Exercises Heel Slides: AAROM;Both;Other reps (comment);Seated (8 reps) End of Session PT - End of Session Equipment Utilized During Treatment: Gait belt Activity Tolerance: Patient limited by pain Patient left: in bed General Behavior During Session: [facility] for tasks performed Cognition: [facility] for tasks performed  [facility] 10/04/2011, 3:59 PM

## 2011-10-04 NOTE — Progress Notes (Signed)
Utilization review completed.  

## 2011-10-04 NOTE — Plan of Care (Signed)
Problem: Consults Goal: Diagnosis- Total Joint Replacement Outcome: Progressing Primary Total Hip Right

## 2011-10-04 NOTE — Progress Notes (Signed)
Subjective: 1 Day Post-Op Procedure(s) (LRB): TOTAL HIP ARTHROPLASTY (Right) Patient reports pain as mild.   Patient seen in rounds with Dr. Wynelle Link. Patient has complaints of soreness with right heel. Had knee immobilizer which hampers moving her heel.  Will DC immobilizer.  Start getting up with therapy.  Encourage to keep heels off bed.  We will start therapy today. Plan is to go home after hospital stay.  Objective: Vital signs in last 24 hours: Temp:  [97.4 F (36.3 C)-98.4 F (36.9 C)] 97.9 F (36.6 C) (11/27 0607) Pulse Rate:  [59-76] 68  (11/27 0607) Resp:  [11-20] 20  (11/27 0607) BP: (105-150)/(52-74) 108/68 mmHg (11/27 0607) SpO2:  [98 %-100 %] 98 % (11/27 0607) Weight:  [63.05 kg (139 lb)] 139 lb (63.05 kg) (11/26 2146)  Intake/Output from previous day:  Intake/Output Summary (Last 24 hours) at 10/04/11 0749 Last data filed at 10/04/11 0608  Gross per 24 hour  Intake   2962 ml  Output   2480 ml  Net    482 ml    Intake/Output this shift:    Labs: Results for orders placed during the hospital encounter of 10/03/11  POCT I-STAT 4, (NA,K, GLUC, HGB,HCT)      Component Value Range   Sodium 127 (*) 135 - 145 (mEq/L)   Potassium 8.0 (*) 3.5 - 5.1 (mEq/L)   Glucose, Bld 91  70 - 99 (mg/dL)   HCT 33.0 (*) 36.0 - 46.0 (%)   Hemoglobin 11.2 (*) 12.0 - 15.0 (g/dL)   Comment NOTIFIED PHYSICIAN    PROTIME-INR      Component Value Range   Prothrombin Time 13.1  11.6 - 15.2 (seconds)   INR 0.97  0.00 - 1.49   CBC      Component Value Range   WBC 8.0  4.0 - 10.5 (K/uL)   RBC 2.42 (*) 3.87 - 5.11 (MIL/uL)   Hemoglobin 8.3 (*) 12.0 - 15.0 (g/dL)   HCT 23.9 (*) 36.0 - 46.0 (%)   MCV 98.8  78.0 - 100.0 (fL)   MCH 33.5  26.0 - 34.0 (pg)   MCHC 33.9  30.0 - 36.0 (g/dL)   RDW 13.0  11.5 - 15.5 (%)   Platelets 275  150 - 400 (K/uL)  BASIC METABOLIC PANEL      Component Value Range   Sodium 129 (*) 135 - 145 (mEq/L)   Potassium 4.3  3.5 - 5.1 (mEq/L)   Chloride 101  96  - 112 (mEq/L)   CO2 22  19 - 32 (mEq/L)   Glucose, Bld 105 (*) 70 - 99 (mg/dL)   BUN 21  6 - 23 (mg/dL)   Creatinine, Ser 1.17 (*) 0.50 - 1.10 (mg/dL)   Calcium 8.2 (*) 8.4 - 10.5 (mg/dL)   GFR calc non Af Amer 48 (*) >90 (mL/min)   GFR calc Af Amer 55 (*) >90 (mL/min)    Exam - Neurovascular intact Sensation intact distally No redness on heel. No sres noted. Dressing - clean, dry Motor function intact - moving foot and toes well on exam.  Hemovac pulled without difficulty.  Assessment/Plan: 1 Day Post-Op Procedure(s) (LRB): TOTAL HIP ARTHROPLASTY (Right) Postop ABLA  - blood today  Past Medical History  Diagnosis Date  . Bilateral leg pain   . HTN (hypertension)   . HLD (hyperlipidemia)   . Fatigue   . Lumbar back pain     with radiculopathy  . Rash     nonvesicular  . Routine general  medical examination at a health care facility   . Screening for lipoid disorders   . Cataract   . Open-angle glaucoma   . PAD (peripheral artery disease)     with intermittent claudication, ABI 0.72 on Rt and 0.73 on L when assessed in 2010.   Marland Kitchen Arthritis     bilateral hips     Advance diet Up with therapy Discharge home with home health when met goals. Blood transfusion today  DVT Prophylaxis - Coumadin Protocol Partial-Weight Bearing 25-50% right Leg D/C Knee Immobilizer Hemovac Pulled Begin Therapy Hip Preacutions Keep foley until tomorrow. No vaccines.  Taylor Bush 10/04/2011, 7:49 AM

## 2011-10-05 ENCOUNTER — Encounter (HOSPITAL_COMMUNITY): Payer: Self-pay | Admitting: Orthopedic Surgery

## 2011-10-05 LAB — BASIC METABOLIC PANEL
BUN: 14 mg/dL (ref 6–23)
Calcium: 8.4 mg/dL (ref 8.4–10.5)
Creatinine, Ser: 1.19 mg/dL — ABNORMAL HIGH (ref 0.50–1.10)
GFR calc Af Amer: 54 mL/min — ABNORMAL LOW (ref >90)
GFR calc non Af Amer: 47 mL/min — ABNORMAL LOW (ref >90)
Potassium: 4.5 mEq/L (ref 3.5–5.1)

## 2011-10-05 LAB — CBC
HCT: 31.6 % — ABNORMAL LOW (ref 36.0–46.0)
MCH: 31.6 pg (ref 26.0–34.0)
MCHC: 33.9 g/dL (ref 30.0–36.0)
MCV: 93.2 fL (ref 78.0–100.0)
RDW: 16.4 % — ABNORMAL HIGH (ref 11.5–15.5)

## 2011-10-05 LAB — TYPE AND SCREEN
Antibody Screen: NEGATIVE
Unit division: 0

## 2011-10-05 LAB — PROTIME-INR: INR: 1.6 — ABNORMAL HIGH (ref 0.00–1.49)

## 2011-10-05 MED ORDER — WARFARIN SODIUM 5 MG PO TABS
5.0000 mg | ORAL_TABLET | Freq: Once | ORAL | Status: AC
  Administered 2011-10-05: 5 mg via ORAL
  Filled 2011-10-05: qty 1

## 2011-10-05 NOTE — Plan of Care (Signed)
Problem: Phase II Progression Outcomes Goal: Discharge plan established Pt doing well. Recommend HH OT eval for ADL safety, tub transfer bench and ADL adaptive equipment as desired for home after acute discharge

## 2011-10-05 NOTE — Progress Notes (Signed)
ANTICOAGULATION CONSULT NOTE - Follow Up Consult  Pharmacy Consult for Coumadin Indication: VTE prophylaxis  Patient Measurements: Height: 5\' 3"  (160 cm) Weight: 139 lb (63.05 kg) IBW/kg (Calculated) : 52.4   Vital Signs: Temp: 99.7 F (37.6 C) (11/28 0600) Temp src: Oral (11/28 0600) BP: 130/75 mmHg (11/28 0600) Pulse Rate: 68  (11/28 0600)  Labs:  Basename 10/05/11 0355 10/04/11 0415 10/03/11 1616  HGB 10.7* 8.3* --  HCT 31.6* 23.9* 33.0*  PLT 228 275 --  APTT -- -- --  LABPROT 19.3* 13.1 --  INR 1.60* 0.97 --  HEPARINUNFRC -- -- --  CREATININE 1.19* 1.17* --  CKTOTAL -- -- --  CKMB -- -- --  TROPONINI -- -- --   Estimated Creatinine Clearance: 42.2 ml/min (by C-G formula based on Cr of 1.19).  Assessment: 15 yr F s/p R THA INR subtherapeutic as expected after only 2 doses. H/H decreased yesterday, but improved with PRBC transfusion yesterday  Goal of Therapy:  INR 2-3   Plan:  Repeat 5mg  Coumadin today Follow daily protimes, CBC Coumadin education.

## 2011-10-05 NOTE — Clinical Documentation Improvement (Signed)
Abnormal Labs Clarification  THIS DOCUMENT IS NOT A PERMANENT PART OF THE MEDICAL RECORD  Please update your documentation within the medical record to reflect your response to this query.                                                                                   10/05/11  Dear Dr. Wynelle Link Rolley Sims  In a better effort to capture your patient's severity of illness, reflect appropriate length of stay and utilization of resources, a review of the medical record has revealed the following indicators.   Based on your clinical judgment, please clarify and document in a progress note and/or discharge summary the clinical condition associated with the following supporting information: In responding to this query please exercise your independent judgment.  The fact that a query is asked, does not imply that any particular answer is desired or expected.  Abnormal findings (laboratory, x-ray, pathologic, and other diagnostic results) are not coded and reported unless the physician indicates their clinical significance.   The medical record reflects the following clinical findings, please clarify the diagnostic and/or clinical significance:      ______________________  Please clarify, if possible , underlying condition (s) responsible for abnormal lab values during pt present admission. Thank you for your exceptional documentation.   Clinical Information:  Lab trend on admission x 3 days  Sodium on 10/03/11 127 10/04/11 129 10/05/11 127  Chloride 10/05/11 95  Creatinine 10/04/11 1.17 10/05/11 1.19  GFR calc non Af Amer >90 mL/min 47 (L) 48 (L    This is not a permanent part of the medical record.      Reviewed: additional documentation in the medical record    Thank You, Philippa Chester RN  Clinical Documentation Specialist Office   Pager [phone]   TO RESPOND TO THE THIS QUERY, [place]:  1. If needed, update documentation for the patient's  encounter via the notes activity.  2. Access this query again and click edit on the [employer].  3. After updating, or not, click F2 to complete all highlighted (required) fields concerning your review. Select "additional documentation in the medical record" OR "no additional documentation provided".  4. Click Sign note button.  5. The deficiency will fall out of your InBasket *Please let us know if you are not able to compete this workflow by phone or e-mail (listed below).

## 2011-10-05 NOTE — Progress Notes (Signed)
Physical Therapy Treatment Patient Details Name: Ilisa Shuey MRN: [id number] DOB: 1946/04/29 Today's Date: 10/05/2011  PT Assessment/Plan  PT - Assessment/Plan Comments on Treatment Session: Pt progressing with PT goals this AM-  Increased ambulation distance.  Pt states pain seems to be slightly better than previous PT session (10/04/11) but still present.   PT Plan: Discharge plan remains appropriate PT Frequency: 7X/week Follow Up Recommendations: Home health PT Equipment Recommended: None recommended by PT PT Goals  Acute Rehab PT Goals PT Goal: Supine/Side to Sit - Progress: Progressing toward goal PT Transfer Goal: Sit to Stand/Stand to Sit - Progress: Progressing toward goal PT Goal: Ambulate - Progress: Progressing toward goal PT Goal: Perform Home Exercise Program - Progress: Progressing toward goal  PT Treatment Precautions/Restrictions  Precautions Precautions: Posterior Hip Precaution Comments: pt able to verbalize 1/3 hip precautions.  Reviewed all 3 precautions Required Braces or Orthoses: No Restrictions Weight Bearing Restrictions: Yes RLE Weight Bearing: Partial weight bearing RLE Partial Weight Bearing Percentage or Pounds: 25-50% Mobility (including Balance) Bed Mobility Bed Mobility: No Supine to Sit: 4: Min assist Supine to Sit Details (indicate cue type and reason): (A) to advance RLE & lift shoulders/trunk to sitting.  Cues for sequencing, technique, reinforcement of hip precautions, UE positioning/use to increase ease of transition.   Sitting - Scoot to [employer] of Bed: Other (comment) (Min Guard (A)) Sitting - Scoot to [place] of Bed Details (indicate cue type and reason): cues for sequencing & technique.   Transfers Sit to Stand: 4: Min assist Sit to Stand Details (indicate cue type and reason): cues for safe hand placement, RLE positioning; (A) to achieve standing & balance Stand to Sit: 4: Min assist Stand to Sit Details: (A) to control descent; cues for  hand placement & RLE positioning Ambulation/Gait Ambulation/Gait Assistance: Other (comment) (min guard (A)) Ambulation/Gait Assistance Details (indicate cue type and reason): cues for sequencing, technique, reinforcement of PWBing; pt with step-through pattern but slight pause in between each step.   Ambulation Distance (Feet): 50 Feet Assistive device: Rolling walker Gait Pattern: Step-through pattern;Decreased hip/knee flexion - right;Antalgic    Exercise  Total Joint Exercises Ankle Circles/Pumps: AROM;10 reps;Both Quad Sets: 10 reps;Both;Strengthening Gluteal Sets: Strengthening;10 reps;Both Hip ABduction/ADduction: AAROM;Right;10 reps End of Session PT - End of Session Equipment Utilized During Treatment: Gait belt Activity Tolerance: Patient limited by pain Patient left: in chair;with call bell in reach General Behavior During Session: [facility] for tasks performed Cognition: [facility] for tasks performed  Sena Hitch 10/05/2011, 10:49 AM

## 2011-10-05 NOTE — Progress Notes (Signed)
Subjective: 2 Days Post-Op Procedure(s) (LRB): TOTAL HIP ARTHROPLASTY (Right) Patient reports pain as mild.   Patient seen in rounds with Dr. Wynelle Link. Patient has complaints of some soreness but doing better each day. Wants to go home after hospital stay.  Objective: Vital signs in last 24 hours: Temp:  [98.2 F (36.8 C)-99.7 F (37.6 C)] 98.9 F (37.2 C) (11/28 1400) Pulse Rate:  [65-75] 75  (11/28 1400) Resp:  [18-20] 18  (11/28 1400) BP: (112-130)/(72-75) 127/75 mmHg (11/28 1400) SpO2:  [96 %-97 %] 96 % (11/28 1400)  Intake/Output from previous day:  Intake/Output Summary (Last 24 hours) at 10/05/11 1538 Last data filed at 10/05/11 1532  Gross per 24 hour  Intake   1250 ml  Output   3200 ml  Net  -1950 ml    Intake/Output this shift: Total I/O In: 400 [P.O.:240; I.V.:160] Out: 700 [Urine:700]  Labs: Results for orders placed during the hospital encounter of 10/03/11  POCT I-STAT 4, (NA,K, GLUC, HGB,HCT)      Component Value Range   Sodium 127 (*) 135 - 145 (mEq/L)   Potassium 8.0 (*) 3.5 - 5.1 (mEq/L)   Glucose, Bld 91  70 - 99 (mg/dL)   HCT 33.0 (*) 36.0 - 46.0 (%)   Hemoglobin 11.2 (*) 12.0 - 15.0 (g/dL)   Comment NOTIFIED PHYSICIAN    PROTIME-INR      Component Value Range   Prothrombin Time 13.1  11.6 - 15.2 (seconds)   INR 0.97  0.00 - 1.49   CBC      Component Value Range   WBC 8.0  4.0 - 10.5 (K/uL)   RBC 2.42 (*) 3.87 - 5.11 (MIL/uL)   Hemoglobin 8.3 (*) 12.0 - 15.0 (g/dL)   HCT 23.9 (*) 36.0 - 46.0 (%)   MCV 98.8  78.0 - 100.0 (fL)   MCH 33.5  26.0 - 34.0 (pg)   MCHC 33.9  30.0 - 36.0 (g/dL)   RDW 13.0  11.5 - 15.5 (%)   Platelets 275  150 - 400 (K/uL)  BASIC METABOLIC PANEL      Component Value Range   Sodium 129 (*) 135 - 145 (mEq/L)   Potassium 4.3  3.5 - 5.1 (mEq/L)   Chloride 101  96 - 112 (mEq/L)   CO2 22  19 - 32 (mEq/L)   Glucose, Bld 105 (*) 70 - 99 (mg/dL)   BUN 21  6 - 23 (mg/dL)   Creatinine, Ser 1.17 (*) 0.50 - 1.10 (mg/dL)   Calcium 8.2 (*) 8.4 - 10.5 (mg/dL)   GFR calc non Af Amer 48 (*) >90 (mL/min)   GFR calc Af Amer 55 (*) >90 (mL/min)  PREPARE RBC (CROSSMATCH)      Component Value Range   Order Confirmation ORDER PROCESSED BY BLOOD BANK    PROTIME-INR      Component Value Range   Prothrombin Time 19.3 (*) 11.6 - 15.2 (seconds)   INR 1.60 (*) 0.00 - 1.49   CBC      Component Value Range   WBC 9.0  4.0 - 10.5 (K/uL)   RBC 3.39 (*) 3.87 - 5.11 (MIL/uL)   Hemoglobin 10.7 (*) 12.0 - 15.0 (g/dL)   HCT 31.6 (*) 36.0 - 46.0 (%)   MCV 93.2  78.0 - 100.0 (fL)   MCH 31.6  26.0 - 34.0 (pg)   MCHC 33.9  30.0 - 36.0 (g/dL)   RDW 16.4 (*) 11.5 - 15.5 (%)   Platelets 228  150 -  400 (K/uL)  BASIC METABOLIC PANEL      Component Value Range   Sodium 127 (*) 135 - 145 (mEq/L)   Potassium 4.5  3.5 - 5.1 (mEq/L)   Chloride 95 (*) 96 - 112 (mEq/L)   CO2 23  19 - 32 (mEq/L)   Glucose, Bld 114 (*) 70 - 99 (mg/dL)   BUN 14  6 - 23 (mg/dL)   Creatinine, Ser 1.19 (*) 0.50 - 1.10 (mg/dL)   Calcium 8.4  8.4 - 10.5 (mg/dL)   GFR calc non Af Amer 47 (*) >90 (mL/min)   GFR calc Af Amer 54 (*) >90 (mL/min)    Exam - Neurovascular intact Sensation intact distally dressing C/D/I Dressing/Incision - clean, dry, no drainage Motor function intact - moving foot and toes well on exam.   Assessment/Plan: 2 Days Post-Op Procedure(s) (LRB): TOTAL HIP ARTHROPLASTY (Right)  Past Medical History  Diagnosis Date  . Bilateral leg pain   . HTN (hypertension)   . HLD (hyperlipidemia)   . Fatigue   . Lumbar back pain     with radiculopathy  . Rash     nonvesicular  . Routine general medical examination at a health care facility   . Screening for lipoid disorders   . Cataract   . Open-angle glaucoma   . PAD (peripheral artery disease)     with intermittent claudication, ABI 0.72 on Rt and 0.73 on L when assessed in 2010.   Marland Kitchen Arthritis     bilateral hips     Advance diet Up with therapy D/C IV fluids Discharge home  with home health when met goasl  DVT Prophylaxis - Coumadin Protocol Partial-Weight Bearing 25-50% right Leg Recheck BMET in am Saline lock IV  Arlee Santosuosso 10/05/2011, 3:38 PM

## 2011-10-05 NOTE — Progress Notes (Signed)
Physical Therapy Treatment Patient Details Name: Taylor Bush MRN: [id number] DOB: [birth year] Today's Date: 10/05/2011  PT Assessment/Plan  PT - Assessment/Plan Comments on Treatment Session: Pt agreeable to participate in PT session despite just returning to bed.  Pt will need to perform stair training before D/C home.   PT Plan: Discharge plan remains appropriate PT Frequency: 7X/week Follow Up Recommendations: Home health PT Equipment Recommended: None recommended by PT PT Goals  Acute Rehab PT Goals PT Goal: Supine/Side to Sit - Progress: Progressing toward goal PT Goal: Sit to Supine/Side - Progress: Progressing toward goal PT Transfer Goal: Sit to Stand/Stand to Sit - Progress: Progressing toward goal PT Goal: Ambulate - Progress: Progressing toward goal  PT Treatment Precautions/Restrictions  Precautions Precautions: Posterior Hip Precaution Comments: pt able to verbalize 1/3 hip precautions.  Reviewed all 3 precautions Required Braces or Orthoses: No Restrictions Weight Bearing Restrictions: Yes RLE Weight Bearing: Partial weight bearing RLE Partial Weight Bearing Percentage or Pounds: 25-50% Mobility (including Balance) Bed Mobility Supine to Sit: 4: Min assist Supine to Sit Details (indicate cue type and reason): (A) for RLE; pt requirese increased time; cues for sequencing, technique Sitting - Scoot to Edge of Bed: Other (comment) (Min Guard) Sitting - Scoot to [employer] of Bed Details (indicate cue type and reason): cues for technique, use of UE's, reinforcement of hip precautions Sit to Supine - Left: 4: Min assist Sit to Supine - Left Details (indicate cue type and reason): (A) for RLE; cues for technique Transfers Sit to Stand: Other (comment);With upper extremity assist;From bed (Min Guard (A)) Sit to Stand Details (indicate cue type and reason): cues for safe hand placement, RLE positioning Stand to Sit: Other (comment);With upper extremity assist;To bed (Min Guard  (A)) Stand to Sit Details: cues for RLE positioning before sitting.   Ambulation/Gait Ambulation/Gait Assistance: Other (comment) (Min Guard (A)) Ambulation/Gait Assistance Details (indicate cue type and reason): Cues for sequencing, technique, increase hip/knee flexion RLE Ambulation Distance (Feet): 100 Feet Assistive device: Rolling walker Gait Pattern: Decreased hip/knee flexion - right;Antalgic;Step-through pattern;Lateral trunk lean to left Stairs: No Wheelchair Mobility Wheelchair Mobility: No    Exercise  Total Joint Exercises Marching in Standing: AROM;Right;10 reps;Standing (UE's supported on RW) End of Session PT - End of Session Equipment Utilized During Treatment: Gait belt Activity Tolerance: Patient tolerated treatment well Patient left: in bed;with call bell in reach;with family/visitor present General Behavior During Session: [facility] for tasks performed Cognition: [facility] for tasks performed  Sena Hitch 10/05/2011, 3:31 PM

## 2011-10-05 NOTE — Progress Notes (Addendum)
Occupational Therapy Evaluation Patient Details Name: Samie Flegel MRN: [id number] DOB: 03-Jan-1946 Today's Date: 10/05/2011  Problem List:  Patient Active Problem List  Diagnoses  . HYPERLIPIDEMIA  . UNSPECIFIED OPEN-ANGLE GLAUCOMA  . UNSPECIFIED CATARACT  . HYPERTENSION  . ATHEROSCLEROSIS W /INT CLAUDICATION  . BACK PAIN, LUMBAR, WITH RADICULOPATHY  . LEG PAIN, BILATERAL  . FATIGUE  . RASH-NONVESICULAR  . Preoperative evaluation of a medical condition to rule out surgical contraindications (TAR required)  . Acute blood loss anemia  . Hyponatremia  . AVN (avascular necrosis of bone) Right Hip    Past Medical History:  Past Medical History  Diagnosis Date  . Bilateral leg pain   . HTN (hypertension)   . HLD (hyperlipidemia)   . Fatigue   . Lumbar back pain     with radiculopathy  . Rash     nonvesicular  . Routine general medical examination at a health care facility   . Screening for lipoid disorders   . Cataract   . Open-angle glaucoma   . PAD (peripheral artery disease)     with intermittent claudication, ABI 0.72 on Rt and 0.73 on L when assessed in 2010.   Marland Kitchen Arthritis     bilateral hips    Past Surgical History:  Past Surgical History  Procedure Date  . Cataract surgery 2/10  . Abnormal vascular studies 7/10    ABI's 70, R SFA > 50%^ blockage   . Other surgical history     ganglion cyst removed left wrist     OT Assessment/Plan/Recommendation OT Assessment Clinical Impression Statement: Pt demos decline in function with strength, balance, activity tolerance and safety for ADLs and ADL mobility. Pt would benefit from skilled OT services to address these impairments to restore PLOF to independent to return home safely OT Recommendation/Assessment: Patient will need skilled OT in the acute care venue OT Problem List: Decreased strength;Decreased activity tolerance;Impaired balance (sitting and/or standing);Decreased knowledge of precautions;Decreased knowledge  of use of DME or AE;Pain Barriers to Discharge: None OT Therapy Diagnosis : Generalized weakness OT Plan OT Frequency: Min 2X/week OT Treatment/Interventions: Self-care/ADL training;Therapeutic activities;Therapeutic exercise;Neuromuscular education;DME and/or AE instruction;Patient/family education;Balance training OT Recommendation Follow Up Recommendations: Home health OT Equipment Recommended: 3 in 1 bedside comode;Tub/shower bench, ADL adaptive equipment as desired Individuals Consulted Consulted and Agree with Results and Recommendations: Patient OT Goals Acute Rehab OT Goals OT Goal Formulation: With patient Time For Goal Achievement: 7 days ADL Goals Pt Will Perform Grooming: with set-up;with supervision;Standing at sink Pt Will Perform Lower Body Bathing: with min assist;Sitting in shower;Other (comment) (using adaptive equipment prn) Pt Will Perform Lower Body Dressing: with min assist;Sitting, chair;Sit to stand from chair;Other (comment) (using adaptive equipment prn) Pt Will Transfer to Toilet: with supervision;with DME Pt Will Perform Toileting - Clothing Manipulation: with supervision (min guard assist) Pt Will Perform Toileting - Hygiene: Independently Pt Will Perform Tub/Shower Transfer: with supervision;Other (comment);Transfer tub bench (min guard assist)  OT Evaluation Precautions/Restrictions  Precautions Required Braces or Orthoses: No Restrictions Weight Bearing Restrictions: Yes RLE Weight Bearing: Partial weight bearing RLE Partial Weight Bearing Percentage or Pounds: 25 - 50% Prior Functioning Home Living Lives With: Spouse Receives Help From: Family Type of Home: House Home Layout: One level Home Access: Stairs to enter Entrance Stairs-Rails: Left Entrance Stairs-Number of Steps: 4 Bathroom Shower/Tub:  (Eductaed pt on safety of using tub bench vs shower seat ) Bathroom Toilet: Standard Bathroom Accessibility: Yes How Accessible: Accessible via  walker Home Adaptive Equipment: Shower  chair with back Additional Comments: Educated pt on safety of utilizing a tub transfer bench vs shower chair at home Prior Function Level of Independence: Independent with basic ADLs;Independent with transfers;Requires assistive device for independence Vocation: Retired ADL ADL Eating/Feeding: Not assessed Grooming: Performed;Wash/dry hands;Wash/dry face;Brushing hair;Minimal assistance Grooming Details (indicate cue type and reason): min guard assist using RW Where Assessed - Grooming: Standing at sink Upper Body Bathing: Simulated;Supervision/safety;Set up Where Assessed - Upper Body Bathing: Sitting, chair Lower Body Bathing: Simulated;Moderate assistance Lower Body Bathing Details (indicate cue type and reason): using long handled bath sponge Where Assessed - Lower Body Bathing: Sitting, chair Upper Body Dressing: Performed;Supervision/safety;Set up Where Assessed - Upper Body Dressing: Sitting, chair;Sit to stand from chair Lower Body Dressing: Performed;Moderate assistance Lower Body Dressing Details (indicate cue type and reason): using reacher to doff socks and sock aid to donn socks Where Assessed - Lower Body Dressing: Sitting, chair Toilet Transfer: Minimal assistance Toilet Transfer Details (indicate cue type and reason): from RW level Toilet Transfer Method: Ambulating Toilet Transfer Equipment: Raised toilet seat with arms (or 3-in-1 over toilet);Grab bars Toileting - Clothing Manipulation: Minimal assistance Where Assessed - Toileting Clothing Manipulation: Standing Toileting - Hygiene: Performed;Supervision/safety Where Assessed - Toileting Hygiene: Sit on 3-in-1 or toilet Tub/Shower Transfer: Performed;Minimal assistance Tub/Shower Transfer Details (indicate cue type and reason): from RW level to shower chair in walk in shower stall Tub/Shower Transfer Method: Ambulating Tub/Shower Transfer Equipment: Shower seat with back;Grab  bars;Walk in shower Equipment Used: Long-handled sponge;Reacher;Rolling walker;Sock aid ADL Comments: Educated pt on use of ADL adaptive equipment at home with demo/return demo. Pt stated that she was familiar with the equipment due to her spouse's past orthopedic surgery, but does not have equipment at home Vision/Perception  Vision - History Baseline Vision: Wears glasses only for reading Visual History: Cataracts Patient Visual Report: No change from baseline Perception Perception: Within Functional Limits Cognition Cognition Arousal/Alertness: Awake/alert Overall Cognitive Status: Appears within functional limits for tasks assessed Orientation Level: Oriented X4 Sensation/Coordination Sensation Light Touch: Appears Intact Coordination Gross Motor Movements are Fluid and Coordinated: Yes Fine Motor Movements are Fluid and Coordinated: Yes Extremity Assessment RUE Assessment RUE Assessment: Within Functional Limits LUE Assessment LUE Assessment: Within Functional Limits Mobility  Bed Mobility Bed Mobility: No Transfers Transfers: Yes Sit to Stand: 4: Min assist Sit to Stand Details (indicate cue type and reason): verbal cues for safe tecnique and hand placement from chair, toilet and shower chair Stand to Sit: 4: Min assist Stand to Sit Details: cues for safety    End of Session OT - End of Session Equipment Utilized During Treatment: Gait belt;Other (comment) (3 in 1 over toilet, shower chair and ADL adaptive equipment) Activity Tolerance: Patient limited by pain Patient left: in chair;with call bell in reach;with family/visitor present General Behavior During Session: [facility] for tasks performed Cognition: [facility] for tasks performed   Britt Bottom 10/05/2011, 10:37 AM

## 2011-10-06 LAB — CBC
HCT: 29 % — ABNORMAL LOW (ref 36.0–46.0)
MCH: 32.2 pg (ref 26.0–34.0)
MCHC: 34.5 g/dL (ref 30.0–36.0)
MCV: 93.2 fL (ref 78.0–100.0)
Platelets: 204 10*3/uL (ref 150–400)
RDW: 15.4 % (ref 11.5–15.5)

## 2011-10-06 LAB — BASIC METABOLIC PANEL
BUN: 14 mg/dL (ref 6–23)
Calcium: 8.5 mg/dL (ref 8.4–10.5)
Creatinine, Ser: 1.05 mg/dL (ref 0.50–1.10)
GFR calc Af Amer: 63 mL/min — ABNORMAL LOW (ref >90)

## 2011-10-06 MED ORDER — WARFARIN SODIUM 3 MG PO TABS: ORAL_TABLET | ORAL | Status: DC

## 2011-10-06 MED ORDER — WARFARIN SODIUM 3 MG PO TABS
3.0000 mg | ORAL_TABLET | Freq: Once | ORAL | Status: AC
  Administered 2011-10-06: 3 mg via ORAL
  Filled 2011-10-06: qty 1

## 2011-10-06 MED ORDER — OXYCODONE HCL 5 MG PO TABS: 5.0000 mg | ORAL_TABLET | ORAL | Status: AC | PRN

## 2011-10-06 MED ORDER — METHOCARBAMOL 500 MG PO TABS: 500.0000 mg | ORAL_TABLET | Freq: Four times a day (QID) | ORAL | Status: AC | PRN

## 2011-10-06 NOTE — Progress Notes (Signed)
Received a call earlier today from the nursing staff.  They stated that the patient did not progress well enough and did not meet all of her therapy goals and would benefit from staying another day in order to improve her mobility, safety, and independence.  Discharge orders were held and patient will stay tonight.  Arlee Muslim, PA-C

## 2011-10-06 NOTE — Progress Notes (Signed)
Physical Therapy Treatment Patient Details Name: Taylor Bush MRN: [id number] DOB: 1946/01/16 Today's Date: 10/06/2011 Time: 1330-1350 Charge: TE PT Assessment/Plan  PT - Assessment/Plan Comments on Treatment Session: Pt performed stairs and required full min assist.  Pt reports spouse would be the only person available for D/C home today and spouse does not seem able to provide physical assist necessary.  Pt reports son will be available tomorrow morning to assist with stairs, so suggested he be present tomorrow morning to practice stairs with pt prior to D/C.  RN alerted PA-C to recommendation of waiting for D/C until tomorrow 2* to unsafe with stairs if D/Ced today.  Reviewed hip precautions again with pt. PT Plan: Discharge plan remains appropriate;Frequency remains appropriate Follow Up Recommendations: Home health PT Equipment Recommended: None recommended by PT PT Goals  Acute Rehab PT Goals PT Goal: Sit to Supine/Side - Progress: Progressing toward goal PT Transfer Goal: Sit to Stand/Stand to Sit - Progress: Progressing toward goal PT Goal: Ambulate - Progress: Progressing toward goal PT Goal: Up/Down Stairs - Progress: Progressing toward goal PT Goal: Perform Home Exercise Program - Progress: Progressing toward goal  PT Treatment Precautions/Restrictions  Precautions Precautions: Posterior Hip Required Braces or Orthoses: No Restrictions Weight Bearing Restrictions: Yes RLE Weight Bearing: Partial weight bearing RLE Partial Weight Bearing Percentage or Pounds: 25-50% Mobility (including Balance) Bed Mobility Bed Mobility: Yes Sit to Supine - Left: 4: Min assist;HOB elevated (comment degrees) Sit to Supine - Left Details (indicate cue type and reason): verbal cues for safe technique including maintaining hip precautions, assist to support LEs onto bed Transfers Transfers: Yes Sit to Stand: 4: Min assist;With upper extremity assist;From chair/3-in-1 Sit to Stand Details  (indicate cue type and reason): min/guard, verbal cues for R LE forward Stand to Sit: 4: Min assist;With upper extremity assist;To bed Stand to Sit Details: min/guard, verbal cues for R LE forward and reaching back for bed Ambulation/Gait Ambulation/Gait: Yes Ambulation/Gait Assistance: 4: Min assist Ambulation/Gait Assistance Details (indicate cue type and reason): min/guard, verbal cues for sequence and again cueing for placing R heel on floor (pt tends to ambulate on R forefoot 2* to habit from previous pain). 20 feet x2 to stairs and back to bed Ambulation Distance (Feet): 40 Feet Assistive device: Rolling walker Gait Pattern: Step-to pattern;Decreased dorsiflexion - right;Antalgic Stairs: Yes Stairs Assistance: 4: Min assist Stairs Assistance Details (indicate cue type and reason): verbal cues for sequence and hand/RW placement, assist for weakness Stair Management Technique: Backwards;With walker Number of Stairs: 2     Exercise  Total Joint Exercises Ankle Circles/Pumps: AROM;20 reps;Supine;Both Quad Sets: AROM;Strengthening;Both;20 reps;Supine Gluteal Sets: AROM;Strengthening;Both;Supine Short Arc Quad: AROM;Strengthening;Right;Supine (20 reps) Heel Slides: AROM;Right;15 reps;Supine Hip ABduction/ADduction: AROM;Strengthening;Right;10 reps;Supine End of Session PT - End of Session Equipment Utilized During Treatment: Gait belt Activity Tolerance: Patient limited by pain Patient left: in bed;with call bell in reach Nurse Communication:  (Pt unsafe to perform stairs, do not recommend D/C today.) General Behavior During Session: [facility] for tasks performed Cognition: [facility] for tasks performed  Eesha Schmaltz,[name] 10/06/2011, 3:20 PM Pager: [phone]

## 2011-10-06 NOTE — Progress Notes (Signed)
Brief Discharge Note Subjective: 3 Days Post-Op Procedure(s) (LRB): TOTAL HIP ARTHROPLASTY (Right) Patient reports pain as mild.   Patient seen in rounds with Dr. Wynelle Link. Patient has complaints of continued pain.  Slow progression with therapy.  Possible home today if meets goals.  Nees more therapy.  Pt will need to perform stair training before D/C home. Will see how she does today.  Objective: Vital signs in last 24 hours: Temp:  [98.3 F (36.8 C)-98.9 F (37.2 C)] 98.3 F (36.8 C) (11/29 0600) Pulse Rate:  [75-78] 78  (11/29 0600) Resp:  [18] 18  (11/29 0600) BP: (106-127)/(68-75) 106/68 mmHg (11/29 0600) SpO2:  [93 %-96 %] 96 % (11/29 0600)  Intake/Output from previous day:  Intake/Output Summary (Last 24 hours) at 10/06/11 1112 Last data filed at 10/06/11 0900  Gross per 24 hour  Intake   1080 ml  Output   1700 ml  Net   -620 ml    Intake/Output this shift: Total I/O In: 240 [P.O.:240] Out: 200 [Urine:200]  Labs: Results for orders placed during the hospital encounter of 10/03/11  POCT I-STAT 4, (NA,K, GLUC, HGB,HCT)      Component Value Range   Sodium 127 (*) 135 - 145 (mEq/L)   Potassium 8.0 (*) 3.5 - 5.1 (mEq/L)   Glucose, Bld 91  70 - 99 (mg/dL)   HCT 33.0 (*) 36.0 - 46.0 (%)   Hemoglobin 11.2 (*) 12.0 - 15.0 (g/dL)   Comment NOTIFIED PHYSICIAN    PROTIME-INR      Component Value Range   Prothrombin Time 13.1  11.6 - 15.2 (seconds)   INR 0.97  0.00 - 1.49   CBC      Component Value Range   WBC 8.0  4.0 - 10.5 (K/uL)   RBC 2.42 (*) 3.87 - 5.11 (MIL/uL)   Hemoglobin 8.3 (*) 12.0 - 15.0 (g/dL)   HCT 23.9 (*) 36.0 - 46.0 (%)   MCV 98.8  78.0 - 100.0 (fL)   MCH 33.5  26.0 - 34.0 (pg)   MCHC 33.9  30.0 - 36.0 (g/dL)   RDW 13.0  11.5 - 15.5 (%)   Platelets 275  150 - 400 (K/uL)  BASIC METABOLIC PANEL      Component Value Range   Sodium 129 (*) 135 - 145 (mEq/L)   Potassium 4.3  3.5 - 5.1 (mEq/L)   Chloride 101  96 - 112 (mEq/L)   CO2 22  19 - 32  (mEq/L)   Glucose, Bld 105 (*) 70 - 99 (mg/dL)   BUN 21  6 - 23 (mg/dL)   Creatinine, Ser 1.17 (*) 0.50 - 1.10 (mg/dL)   Calcium 8.2 (*) 8.4 - 10.5 (mg/dL)   GFR calc non Af Amer 48 (*) >90 (mL/min)   GFR calc Af Amer 55 (*) >90 (mL/min)  PREPARE RBC (CROSSMATCH)      Component Value Range   Order Confirmation ORDER PROCESSED BY BLOOD BANK    PROTIME-INR      Component Value Range   Prothrombin Time 19.3 (*) 11.6 - 15.2 (seconds)   INR 1.60 (*) 0.00 - 1.49   CBC      Component Value Range   WBC 9.0  4.0 - 10.5 (K/uL)   RBC 3.39 (*) 3.87 - 5.11 (MIL/uL)   Hemoglobin 10.7 (*) 12.0 - 15.0 (g/dL)   HCT 31.6 (*) 36.0 - 46.0 (%)   MCV 93.2  78.0 - 100.0 (fL)   MCH 31.6  26.0 - 34.0 (pg)  MCHC 33.9  30.0 - 36.0 (g/dL)   RDW 16.4 (*) 11.5 - 15.5 (%)   Platelets 228  150 - 400 (K/uL)  BASIC METABOLIC PANEL      Component Value Range   Sodium 127 (*) 135 - 145 (mEq/L)   Potassium 4.5  3.5 - 5.1 (mEq/L)   Chloride 95 (*) 96 - 112 (mEq/L)   CO2 23  19 - 32 (mEq/L)   Glucose, Bld 114 (*) 70 - 99 (mg/dL)   BUN 14  6 - 23 (mg/dL)   Creatinine, Ser 1.19 (*) 0.50 - 1.10 (mg/dL)   Calcium 8.4  8.4 - 10.5 (mg/dL)   GFR calc non Af Amer 47 (*) >90 (mL/min)   GFR calc Af Amer 54 (*) >90 (mL/min)  PROTIME-INR      Component Value Range   Prothrombin Time 28.6 (*) 11.6 - 15.2 (seconds)   INR 2.64 (*) 0.00 - 1.49   CBC      Component Value Range   WBC 11.0 (*) 4.0 - 10.5 (K/uL)   RBC 3.11 (*) 3.87 - 5.11 (MIL/uL)   Hemoglobin 10.0 (*) 12.0 - 15.0 (g/dL)   HCT 29.0 (*) 36.0 - 46.0 (%)   MCV 93.2  78.0 - 100.0 (fL)   MCH 32.2  26.0 - 34.0 (pg)   MCHC 34.5  30.0 - 36.0 (g/dL)   RDW 15.4  11.5 - 15.5 (%)   Platelets 204  150 - 400 (K/uL)  BASIC METABOLIC PANEL      Component Value Range   Sodium 126 (*) 135 - 145 (mEq/L)   Potassium 4.1  3.5 - 5.1 (mEq/L)   Chloride 94 (*) 96 - 112 (mEq/L)   CO2 23  19 - 32 (mEq/L)   Glucose, Bld 107 (*) 70 - 99 (mg/dL)   BUN 14  6 - 23 (mg/dL)    Creatinine, Ser 1.05  0.50 - 1.10 (mg/dL)   Calcium 8.5  8.4 - 10.5 (mg/dL)   GFR calc non Af Amer 55 (*) >90 (mL/min)   GFR calc Af Amer 63 (*) >90 (mL/min)    Exam - Neurovascular intact Sensation intact distally dressing C/D/I Dressing/Incision - clean, dry, no drainage Motor function intact - moving foot and toes well on exam.   Assessment/Plan: 3 Days Post-Op Procedure(s) (LRB): TOTAL HIP ARTHROPLASTY (Right) Hyponatremia  Past Medical History  Diagnosis Date  . Bilateral leg pain   . HTN (hypertension)   . HLD (hyperlipidemia)   . Fatigue   . Lumbar back pain     with radiculopathy  . Rash     nonvesicular  . Routine general medical examination at a health care facility   . Screening for lipoid disorders   . Cataract   . Open-angle glaucoma   . PAD (peripheral artery disease)     with intermittent claudication, ABI 0.72 on Rt and 0.73 on L when assessed in 2010.   Marland Kitchen Arthritis     bilateral hips     Up with therapy Discharge home with home health when met goals  DC Meds - Oxy IR, Robaxin, Coumadin  DVT Prophylaxis - Coumadin Protocol Partial-Weight Bearing 25-50% right Leg  PERKINS, ALEXZANDREW 10/06/2011, 11:12 AM

## 2011-10-06 NOTE — Progress Notes (Signed)
ANTICOAGULATION CONSULT NOTE - Follow Up Consult  Pharmacy Consult for Coumadin Indication: VTE prophylaxis  Patient Measurements: Height: 5\' 3"  (160 cm) Weight: 139 lb (63.05 kg) IBW/kg (Calculated) : 52.4   Vital Signs: Temp: 98.3 F (36.8 C) (11/29 0600) Temp src: Oral (11/29 0600) BP: 106/68 mmHg (11/29 0600) Pulse Rate: 78  (11/29 0600)  Labs:  Basename 10/06/11 0350 10/05/11 0355 10/04/11 0415  HGB 10.0* 10.7* --  HCT 29.0* 31.6* 23.9*  PLT 204 228 275  APTT -- -- --  LABPROT 28.6* 19.3* 13.1  INR 2.64* 1.60* 0.97  HEPARINUNFRC -- -- --  CREATININE 1.05 1.19* 1.17*  CKTOTAL -- -- --  CKMB -- -- --  TROPONINI -- -- --   Estimated Creatinine Clearance: 47.8 ml/min (by C-G formula based on Cr of 1.05).  Assessment: 81 yr F s/p R THA INR therapeutic, with fairly large increase overnight.  Goal of Therapy:  INR 2-3   Plan:  Coumadin 3mg  today Follow daily PT/INR, CBC Coumadin education.  Gretta Arab PharmD  Pager [phone] 10/06/2011 11:35 AM

## 2011-10-06 NOTE — Progress Notes (Signed)
Physical Therapy Treatment Patient Details Name: Taylor Bush MRN: [id number] DOB: Dec 25, 1945 Today's Date: 10/06/2011 Time: 935-950 Charge: [name]  PT Assessment/Plan  PT - Assessment/Plan Comments on Treatment Session: Pt able to progress ambulation today.  Pt able to verbalize 2/3 posterior hip precautions and PWB status.  Family arrived mid-way through ambulation. PT Plan: Discharge plan remains appropriate;Frequency remains appropriate Follow Up Recommendations: Home health PT Equipment Recommended: None recommended by PT PT Goals  Acute Rehab PT Goals Pt will go Supine/Side to Sit: with supervision PT Goal: Supine/Side to Sit - Progress: Revised (modified due to lack of progress/goal met) PT Transfer Goal: Sit to Stand/Stand to Sit - Progress: Progressing toward goal PT Goal: Ambulate - Progress: Progressing toward goal  PT Treatment Precautions/Restrictions  Precautions Precautions: Posterior Hip Required Braces or Orthoses: No Restrictions Weight Bearing Restrictions: Yes RLE Weight Bearing: Partial weight bearing RLE Partial Weight Bearing Percentage or Pounds: 25-50% Mobility (including Balance) Bed Mobility Bed Mobility: Yes Supine to Sit: 4: Min assist;HOB elevated (Comment degrees);With rails Supine to Sit Details (indicate cue type and reason): assist for R LE and therapist's hand to bring up trunk, verbal cues for keeping hip precautions Transfers Transfers: Yes Sit to Stand: 4: Min assist;With upper extremity assist;From bed Sit to Stand Details (indicate cue type and reason): assist for weakness, verbal cues for R LE forward and hand placement Stand to Sit: 4: Min assist;With upper extremity assist;To chair/3-in-1 Stand to Sit Details: min/guard, verbal cues for R LE forward and hand placement Ambulation/Gait Ambulation/Gait: Yes Ambulation/Gait Assistance: 4: Min assist Ambulation/Gait Assistance Details (indicate cue type and reason): min/guard, verbal cues  for sequence and placing R heel on floor (pt tends to ambulate on R forefoot 2* to habit from previous pain) Ambulation Distance (Feet): 120 Feet Assistive device: Rolling walker Gait Pattern: Step-to pattern;Decreased dorsiflexion - right;Antalgic Gait velocity: slow cadence    Exercise    End of Session PT - End of Session Activity Tolerance: Patient tolerated treatment well Patient left: in chair;with call bell in reach;with family/visitor present General Behavior During Session: [facility] for tasks performed Cognition: [facility] for tasks performed  Taylor Bush,Taylor Bush 10/06/2011, 11:32 AM Pager: [phone]

## 2011-10-07 LAB — PROTIME-INR: INR: 3.31 — ABNORMAL HIGH (ref 0.00–1.49)

## 2011-10-07 NOTE — Progress Notes (Signed)
DC instructions gone over with patient and family.  Rx given for Couamdin and advised patient to take in the evening time.  Will be followed by home health for INR checks.  Also gave prescriptions for pain and Robaxin.  Answered families questions, patient verbalized understanding of instructions.  Gave instructions on care of incision and also stage II to buttocks.  Patient transported to front of hospital accompanied by husband and daughter.

## 2011-10-07 NOTE — Progress Notes (Signed)
Physical Therapy Treatment Patient Details Name: Taylor Bush MRN: [id number] DOB: 19-May-1946 Today's Date: 10/07/2011 Time: [id number] Charge: Taylor Bush PT Assessment/Plan  PT - Assessment/Plan Comments on Treatment Session: Pt performed stairs well today and given handout.  Spouse and son not present but pt feels comfortable performing stairs and reports husband familiar with stairs.  Pt also verbalized safe car transfer technique. Pt has no questions/concerns. PT Plan: Discharge plan remains appropriate;Frequency remains appropriate Follow Up Recommendations: Home health PT Equipment Recommended: None recommended by PT PT Goals  Acute Rehab PT Goals PT Transfer Goal: Sit to Stand/Stand to Sit - Progress: Met PT Goal: Ambulate - Progress: Progressing toward goal PT Goal: Up/Down Stairs - Progress: Met  PT Treatment Precautions/Restrictions  Precautions Precautions: Posterior Hip Precaution Comments: pt able to verbalize 1/3 hip precautions.  Reviewed all 3 precautions Required Braces or Orthoses: No Restrictions Weight Bearing Restrictions: Yes RLE Weight Bearing: Partial weight bearing RLE Partial Weight Bearing Percentage or Pounds: 25-50% Mobility (including Balance) Bed Mobility Bed Mobility: No Transfers Transfers: Yes Sit to Stand: 5: Supervision Sit to Stand Details (indicate cue type and reason): verbal cue for R LE forward Stand to Sit: 5: Supervision Stand to Sit Details: verbal cue for R LE forward Ambulation/Gait Ambulation/Gait: Yes Ambulation/Gait Assistance: 5: Supervision Ambulation/Gait Assistance Details (indicate cue type and reason): Pt able to demonstrate flat foot (remembered to put down on floor) Ambulation Distance (Feet): 100 Feet Assistive device: Rolling walker Gait Pattern: Step-to pattern;Antalgic Stairs: Yes Stairs Assistance: 4: Min assist Stairs Assistance Details (indicate cue type and reason): min/guard with increased verbal cues for technique on  1st attempt and then only needed verbal cues for correct hand placement on RW on 2nd time.  (performed x2) Stair Management Technique: Backwards;With walker Number of Stairs: 4     Exercise    End of Session PT - End of Session Equipment Utilized During Treatment: Gait belt Activity Tolerance: Patient limited by pain Patient left: in chair;with call bell in reach Nurse Communication: Other (comment) (Pt did well with stairs, pt requesting pain meds if time) General Behavior During Session: [facility] for tasks performed Cognition: [facility] for tasks performed  Taylor Bush,Taylor Bush 10/07/2011, 9:15 AM Pager: [phone]

## 2011-10-07 NOTE — Discharge Summary (Signed)
Physician Discharge Summary   Patient ID: Taylor Bush MRN: [id number] DOB/AGE: [birth year] 65 y.o.  Admit date: 10/03/2011 Discharge date: 10/07/2011  Primary Diagnosis: AVN (avascular necrosis of bone) Right Hip  Admission Diagnoses: Past Medical History  Diagnosis Date  . Bilateral leg pain   . HTN (hypertension)   . HLD (hyperlipidemia)   . Fatigue   . Lumbar back pain     with radiculopathy  . Rash     nonvesicular  . Routine general medical examination at a health care facility   . Screening for lipoid disorders   . Cataract   . Open-angle glaucoma   . PAD (peripheral artery disease)     with intermittent claudication, ABI 0.72 on Rt and 0.73 on L when assessed in 2010.   Marland Kitchen Arthritis     bilateral hips     Discharge Diagnoses:  Principal Problem:  *AVN (avascular necrosis of bone) Right Hip Active Problems:  Acute blood loss anemia  Hyponatremia S/P Transfusion  Procedure: Procedure(s) (LRB): TOTAL HIP ARTHROPLASTY (Right)   Consults: none  HPI:  Taylor Bush is a 65 y.o. female with end stage arthritis of her right hip with progressively worsening pain and dysfunction. Pain occurs with activity and rest including pain at night. She has tried analgesics, protected weight bearing and rest without benefit. Pain is too severe to attempt physical therapy. Radiographs demonstrate bone on bone arthritis with subchondral cyst formation and collapse of the femoral head. She presents now for right THA.  Laboratory Data: Results for orders placed during the hospital encounter of 10/03/11  POCT I-STAT 4, (NA,K, GLUC, HGB,HCT)      Component Value Range   Sodium 127 (*) 135 - 145 (mEq/L)   Potassium 8.0 (*) 3.5 - 5.1 (mEq/L)   Glucose, Bld 91  70 - 99 (mg/dL)   HCT 33.0 (*) 36.0 - 46.0 (%)   Hemoglobin 11.2 (*) 12.0 - 15.0 (g/dL)   Comment NOTIFIED PHYSICIAN    PROTIME-INR      Component Value Range   Prothrombin Time 13.1  11.6 - 15.2 (seconds)   INR 0.97  0.00 -  1.49   CBC      Component Value Range   WBC 8.0  4.0 - 10.5 (K/uL)   RBC 2.42 (*) 3.87 - 5.11 (MIL/uL)   Hemoglobin 8.3 (*) 12.0 - 15.0 (g/dL)   HCT 23.9 (*) 36.0 - 46.0 (%)   MCV 98.8  78.0 - 100.0 (fL)   MCH 33.5  26.0 - 34.0 (pg)   MCHC 33.9  30.0 - 36.0 (g/dL)   RDW 13.0  11.5 - 15.5 (%)   Platelets 275  150 - 400 (K/uL)  BASIC METABOLIC PANEL      Component Value Range   Sodium 129 (*) 135 - 145 (mEq/L)   Potassium 4.3  3.5 - 5.1 (mEq/L)   Chloride 101  96 - 112 (mEq/L)   CO2 22  19 - 32 (mEq/L)   Glucose, Bld 105 (*) 70 - 99 (mg/dL)   BUN 21  6 - 23 (mg/dL)   Creatinine, Ser 1.17 (*) 0.50 - 1.10 (mg/dL)   Calcium 8.2 (*) 8.4 - 10.5 (mg/dL)   GFR calc non Af Amer 48 (*) >90 (mL/min)   GFR calc Af Amer 55 (*) >90 (mL/min)  PREPARE RBC (CROSSMATCH)      Component Value Range   Order Confirmation ORDER PROCESSED BY BLOOD BANK    PROTIME-INR      Component Value Range  Prothrombin Time 19.3 (*) 11.6 - 15.2 (seconds)   INR 1.60 (*) 0.00 - 1.49   CBC      Component Value Range   WBC 9.0  4.0 - 10.5 (K/uL)   RBC 3.39 (*) 3.87 - 5.11 (MIL/uL)   Hemoglobin 10.7 (*) 12.0 - 15.0 (g/dL)   HCT 31.6 (*) 36.0 - 46.0 (%)   MCV 93.2  78.0 - 100.0 (fL)   MCH 31.6  26.0 - 34.0 (pg)   MCHC 33.9  30.0 - 36.0 (g/dL)   RDW 16.4 (*) 11.5 - 15.5 (%)   Platelets 228  150 - 400 (K/uL)  BASIC METABOLIC PANEL      Component Value Range   Sodium 127 (*) 135 - 145 (mEq/L)   Potassium 4.5  3.5 - 5.1 (mEq/L)   Chloride 95 (*) 96 - 112 (mEq/L)   CO2 23  19 - 32 (mEq/L)   Glucose, Bld 114 (*) 70 - 99 (mg/dL)   BUN 14  6 - 23 (mg/dL)   Creatinine, Ser 1.19 (*) 0.50 - 1.10 (mg/dL)   Calcium 8.4  8.4 - 10.5 (mg/dL)   GFR calc non Af Amer 47 (*) >90 (mL/min)   GFR calc Af Amer 54 (*) >90 (mL/min)  PROTIME-INR      Component Value Range   Prothrombin Time 28.6 (*) 11.6 - 15.2 (seconds)   INR 2.64 (*) 0.00 - 1.49   CBC      Component Value Range   WBC 11.0 (*) 4.0 - 10.5 (K/uL)   RBC 3.11  (*) 3.87 - 5.11 (MIL/uL)   Hemoglobin 10.0 (*) 12.0 - 15.0 (g/dL)   HCT 29.0 (*) 36.0 - 46.0 (%)   MCV 93.2  78.0 - 100.0 (fL)   MCH 32.2  26.0 - 34.0 (pg)   MCHC 34.5  30.0 - 36.0 (g/dL)   RDW 15.4  11.5 - 15.5 (%)   Platelets 204  150 - 400 (K/uL)  BASIC METABOLIC PANEL      Component Value Range   Sodium 126 (*) 135 - 145 (mEq/L)   Potassium 4.1  3.5 - 5.1 (mEq/L)   Chloride 94 (*) 96 - 112 (mEq/L)   CO2 23  19 - 32 (mEq/L)   Glucose, Bld 107 (*) 70 - 99 (mg/dL)   BUN 14  6 - 23 (mg/dL)   Creatinine, Ser 1.05  0.50 - 1.10 (mg/dL)   Calcium 8.5  8.4 - 10.5 (mg/dL)   GFR calc non Af Amer 55 (*) >90 (mL/min)   GFR calc Af Amer 63 (*) >90 (mL/min)  PROTIME-INR      Component Value Range   Prothrombin Time 34.1 (*) 11.6 - 15.2 (seconds)   INR 3.31 (*) 0.00 - 1.49    Orders Only on 09/22/2011  Component Date Value Range Status  . ABO/RH(D)  09/22/2011 O POS   Final    X-Rays:Dg Chest 2 View  09/22/2011  *RADIOLOGY REPORT*  Clinical Data: Preop for right hip replacement.  History of hypertension and previous smoker.  CHEST - 2 VIEW  Comparison: None.  Findings: Normal cardiac and mediastinal silhouette.  Clear lung fields.  Mild degenerative change thoracic spine.  No nodules or infiltrates.  No effusion or pneumothorax.  IMPRESSION: No active disease.  Original Report Authenticated By: Staci Righter, M.D.   Ap Pelvis Hip  10/03/2011  *RADIOLOGY REPORT*  Clinical Data: Postop  PELVIS - 1-2 VIEW  Comparison: Pelvis right hip radiographs 09/22/2011  Findings: There are  immediate postoperative changes of right total hip arthroplasty.  Arthroplasty is located.  There is no evidence of periprosthetic fracture.  No hardware complication is seen. Expected locules of subcutaneous gas noted about the right hip and there is a soft tissue drain.  Again noted are changes of left femoral head avascular necrosis, stable.  IMPRESSION:  1.  Right total hip arthroplasty.  No complicating features. 2.   Left femoral head avascular necrosis, stable.  Original Report Authenticated By: Curlene Dolphin, M.D.   Dg Hip Complete Right  09/22/2011  *RADIOLOGY REPORT*  Clinical Data: Right hip pain.  RIGHT HIP - COMPLETE 2+ VIEW  Comparison: 08/01/2011  Findings: Progression of bilateral hip avascular necrosis, worse on the right, with collapse of the femoral head and marked flattening. Reactive changes are seen in the acetabulum.  Pubic symphysis and SI joints are intact.  IMPRESSION: Progression of bilateral hip AVN since 08/01/2011, worse on the right.  Original Report Authenticated By: Staci Righter, M.D.    EKG: Orders placed in visit on 09/05/11  . EKG 12-LEAD     Hospital Course: Patient was admitted to [facility] and taken to the OR and underwent the above state procedure without complications.  Patient tolerated the procedure well and was later transferred to the recovery room and then to the orthopaedic floor for postoperative care.  They were given PO and IV analgesics for pain control following their surgery.  They were given 24 hours of postoperative antibiotics and started on DVT prophylaxis.   PT and OT were ordered for total joint protocol.  Discharge planning consulted to help with postop disposition and equipment needs.  Patient had a tougn night on the evening of surgery and started to get up with therapy on day one.  Hemovac drain was pulled without difficulty.  She was allowed to be PWB 25-50%.  Her progression with therapy was slow to start. She was also noted to have a low hgb and received blood transfusion.   Continued to progress with therapy into day two but slowly.  Dressing was changed on day two and the incision was healing well.  By day three, the patient had progressed with therapy but had not  met goals yet.   She stayed into the next day and progressed further.  By day four, her incision was healing well.  Patient was seen in rounds and was ready to go home.  Discharge  Medications: Prior to Admission medications   Medication Sig Start Date End Date Taking? Authorizing Provider  amLODipine (NORVASC) 10 MG tablet Take 1 tablet (10 mg total) by mouth daily. 07/19/11 07/18/12  Owens Loffler, MD  lisinopril-hydrochlorothiazide (PRINZIDE,ZESTORETIC) 20-25 MG per tablet Take 1 tablet by mouth every morning.  07/19/11 07/18/12  Owens Loffler, MD  methocarbamol (ROBAXIN) 500 MG tablet Take 1 tablet (500 mg total) by mouth every 6 (six) hours as needed. 10/06/11 10/16/11  Alexzandrew Perkins, PA  metoprolol (LOPRESSOR) 100 MG tablet Take 1 tablet (100 mg total) by mouth 2 (two) times daily. 08/01/11 07/31/12  Owens Loffler, MD  oxyCODONE (OXY IR/ROXICODONE) 5 MG immediate release tablet Take 1-2 tablets (5-10 mg total) by mouth every 3 (three) hours as needed. 10/06/11 10/16/11  Alexzandrew Perkins, PA  warfarin (COUMADIN) 3 MG tablet Take As Directed 10/06/11   Alexzandrew Dara Lords, PA    Diet: heart healthy  Activity:PWB No bending hip over 90 degrees- A "L" Angle Do not cross legs Do not let foot roll inward  When  turning these patients a pillow should be placed between the patient's legs to prevent crossing.  Patients should have the affected knee fully extended when trying to sit or stand from all surfaces to prevent excessive hip flexion.  When ambulating and turning toward the affected side the affected leg should have the toes turned out prior to moving the walker and the rest of patient's body as to prevent internal rotation/ turning in of the leg.  Abduction pillows are the most effective way to prevent a patient from not crossing legs or turning toes in at rest. If an abduction pillow is not ordered placing a regular pillow length wise between the patient's legs is also an effective reminder.  It is imperative that these precautions be maintained so that the surgical hip does not dislocate.    Follow-up:in 2 weeks  Disposition: Home  Discharged  Condition: fair   Discharge Orders    Future Orders Please Complete By Expires   Diet - low sodium heart healthy      Diet - low sodium heart healthy      Call MD / Call 911      Comments:   If you experience chest pain or shortness of breath, CALL 911 and be transported to the hospital emergency room.  If you develope a fever above 101 F, pus (white drainage) or increased drainage or redness at the wound, or calf pain, call your surgeon's office.   Constipation Prevention      Comments:   Drink plenty of fluids.  Prune juice may be helpful.  You may use a stool softener, such as Colace (over the counter) 100 mg twice a day.  Use MiraLax (over the counter) for constipation as needed.   Increase activity slowly as tolerated      Weight Bearing as taught in Physical Therapy      Comments:   Use a walker or crutches as instructed.   Driving restrictions      Comments:   No driving   Lifting restrictions      Comments:   No lifting   Follow the hip precautions as taught in Physical Therapy      Change dressing      Comments:   You may change your dressing daily with sterile 4 x 4 inch gauze dressing and paper tape.   TED hose      Comments:   Use stockings (TED hose) for 3 weeks on both leg(s).  You may remove them at night for sleeping.   Discharge instructions      Comments:   Pick up stool softner and laxative for home. Do not submerge incision under water. May shower. Hip precautions.  Total Hip Protocol.   Call MD / Call 911      Comments:   If you experience chest pain or shortness of breath, CALL 911 and be transported to the hospital emergency room.  If you develope a fever above 101 F, pus (white drainage) or increased drainage or redness at the wound, or calf pain, call your surgeon's office.   Constipation Prevention      Comments:   Drink plenty of fluids.  Prune juice may be helpful.  You may use a stool softener, such as Colace (over the counter) 100 mg twice a  day.  Use MiraLax (over the counter) for constipation as needed.   Increase activity slowly as tolerated      Weight Bearing as taught in Physical Therapy  Comments:   Use a walker or crutches as instructed.     Discharge Medication List as of 10/07/2011 10:18 AM    START taking these medications   Details  methocarbamol (ROBAXIN) 500 MG tablet Take 1 tablet (500 mg total) by mouth every 6 (six) hours as needed., Starting 10/06/2011, Until Sun 10/16/11, Print    oxyCODONE (OXY IR/ROXICODONE) 5 MG immediate release tablet Take 1-2 tablets (5-10 mg total) by mouth every 3 (three) hours as needed., Starting 10/06/2011, Until Sun 10/16/11, Print    warfarin (COUMADIN) 3 MG tablet Take As Directed, Print      CONTINUE these medications which have NOT CHANGED   Details  amLODipine (NORVASC) 10 MG tablet Take 1 tablet (10 mg total) by mouth daily., Starting 07/19/2011, Until Wed 07/18/12, Normal    lisinopril-hydrochlorothiazide (PRINZIDE,ZESTORETIC) 20-25 MG per tablet Take 1 tablet by mouth every morning. , Starting 07/19/2011, Until Wed 07/18/12, Historical Med    metoprolol (LOPRESSOR) 100 MG tablet Take 1 tablet (100 mg total) by mouth 2 (two) times daily., Starting 08/01/2011, Until Tue 07/31/12, Normal      STOP taking these medications     HYDROcodone-acetaminophen (NORCO) 5-325 MG per tablet        Follow-up Information    Follow up with [facility]. Make an appointment in 2 weeks.   Contact information:   [facility] [address] [place] [id number]          Signed: Mickel Crow 10/07/2011, 11:38 PM

## 2011-10-07 NOTE — Plan of Care (Signed)
Problem: Discharge Progression Outcomes Goal: Anticoagulant follow-up in place Outcome: Completed/Met Date Met:  10/07/11 Patient is to be followed up by home care and Dr. Wynelle Link  Goal: Negotiates stairs Outcome: Adequate for Discharge Worked with therapy on this today with satisfactory results

## 2011-10-07 NOTE — Progress Notes (Signed)
Subjective: 4 Days Post-Op Procedure(s) (LRB): TOTAL HIP ARTHROPLASTY (Right) Patient reports pain as mild.   Patient seen in rounds with Dr. Wynelle Link. Patient states she is doing better and ready to go home.  Objective: Vital signs in last 24 hours: Temp:  [98.1 F (36.7 C)-98.8 F (37.1 C)] 98.1 F (36.7 C) (11/30 0600) Pulse Rate:  [68-69] 68  (11/30 0600) Resp:  [18] 18  (11/30 0600) BP: (115-122)/(71-77) 116/77 mmHg (11/30 0600) SpO2:  [98 %-100 %] 100 % (11/30 0600)  Intake/Output from previous day:  Intake/Output Summary (Last 24 hours) at 10/07/11 0953 Last data filed at 10/07/11 0500  Gross per 24 hour  Intake    960 ml  Output   1050 ml  Net    -90 ml    Intake/Output this shift:    Labs: Results for orders placed during the hospital encounter of 10/03/11  POCT I-STAT 4, (NA,K, GLUC, HGB,HCT)      Component Value Range   Sodium 127 (*) 135 - 145 (mEq/L)   Potassium 8.0 (*) 3.5 - 5.1 (mEq/L)   Glucose, Bld 91  70 - 99 (mg/dL)   HCT 33.0 (*) 36.0 - 46.0 (%)   Hemoglobin 11.2 (*) 12.0 - 15.0 (g/dL)   Comment NOTIFIED PHYSICIAN    PROTIME-INR      Component Value Range   Prothrombin Time 13.1  11.6 - 15.2 (seconds)   INR 0.97  0.00 - 1.49   CBC      Component Value Range   WBC 8.0  4.0 - 10.5 (K/uL)   RBC 2.42 (*) 3.87 - 5.11 (MIL/uL)   Hemoglobin 8.3 (*) 12.0 - 15.0 (g/dL)   HCT 23.9 (*) 36.0 - 46.0 (%)   MCV 98.8  78.0 - 100.0 (fL)   MCH 33.5  26.0 - 34.0 (pg)   MCHC 33.9  30.0 - 36.0 (g/dL)   RDW 13.0  11.5 - 15.5 (%)   Platelets 275  150 - 400 (K/uL)  BASIC METABOLIC PANEL      Component Value Range   Sodium 129 (*) 135 - 145 (mEq/L)   Potassium 4.3  3.5 - 5.1 (mEq/L)   Chloride 101  96 - 112 (mEq/L)   CO2 22  19 - 32 (mEq/L)   Glucose, Bld 105 (*) 70 - 99 (mg/dL)   BUN 21  6 - 23 (mg/dL)   Creatinine, Ser 1.17 (*) 0.50 - 1.10 (mg/dL)   Calcium 8.2 (*) 8.4 - 10.5 (mg/dL)   GFR calc non Af Amer 48 (*) >90 (mL/min)   GFR calc Af Amer 55 (*) >90  (mL/min)  PREPARE RBC (CROSSMATCH)      Component Value Range   Order Confirmation ORDER PROCESSED BY BLOOD BANK    PROTIME-INR      Component Value Range   Prothrombin Time 19.3 (*) 11.6 - 15.2 (seconds)   INR 1.60 (*) 0.00 - 1.49   CBC      Component Value Range   WBC 9.0  4.0 - 10.5 (K/uL)   RBC 3.39 (*) 3.87 - 5.11 (MIL/uL)   Hemoglobin 10.7 (*) 12.0 - 15.0 (g/dL)   HCT 31.6 (*) 36.0 - 46.0 (%)   MCV 93.2  78.0 - 100.0 (fL)   MCH 31.6  26.0 - 34.0 (pg)   MCHC 33.9  30.0 - 36.0 (g/dL)   RDW 16.4 (*) 11.5 - 15.5 (%)   Platelets 228  150 - 400 (K/uL)  BASIC METABOLIC PANEL  Component Value Range   Sodium 127 (*) 135 - 145 (mEq/L)   Potassium 4.5  3.5 - 5.1 (mEq/L)   Chloride 95 (*) 96 - 112 (mEq/L)   CO2 23  19 - 32 (mEq/L)   Glucose, Bld 114 (*) 70 - 99 (mg/dL)   BUN 14  6 - 23 (mg/dL)   Creatinine, Ser 1.19 (*) 0.50 - 1.10 (mg/dL)   Calcium 8.4  8.4 - 10.5 (mg/dL)   GFR calc non Af Amer 47 (*) >90 (mL/min)   GFR calc Af Amer 54 (*) >90 (mL/min)  PROTIME-INR      Component Value Range   Prothrombin Time 28.6 (*) 11.6 - 15.2 (seconds)   INR 2.64 (*) 0.00 - 1.49   CBC      Component Value Range   WBC 11.0 (*) 4.0 - 10.5 (K/uL)   RBC 3.11 (*) 3.87 - 5.11 (MIL/uL)   Hemoglobin 10.0 (*) 12.0 - 15.0 (g/dL)   HCT 29.0 (*) 36.0 - 46.0 (%)   MCV 93.2  78.0 - 100.0 (fL)   MCH 32.2  26.0 - 34.0 (pg)   MCHC 34.5  30.0 - 36.0 (g/dL)   RDW 15.4  11.5 - 15.5 (%)   Platelets 204  150 - 400 (K/uL)  BASIC METABOLIC PANEL      Component Value Range   Sodium 126 (*) 135 - 145 (mEq/L)   Potassium 4.1  3.5 - 5.1 (mEq/L)   Chloride 94 (*) 96 - 112 (mEq/L)   CO2 23  19 - 32 (mEq/L)   Glucose, Bld 107 (*) 70 - 99 (mg/dL)   BUN 14  6 - 23 (mg/dL)   Creatinine, Ser 1.05  0.50 - 1.10 (mg/dL)   Calcium 8.5  8.4 - 10.5 (mg/dL)   GFR calc non Af Amer 55 (*) >90 (mL/min)   GFR calc Af Amer 63 (*) >90 (mL/min)  PROTIME-INR      Component Value Range   Prothrombin Time 34.1 (*) 11.6 -  15.2 (seconds)   INR 3.31 (*) 0.00 - 1.49     Exam: Neurovascular intact Sensation intact distally clean, dry, no drainage Motor function intact - moving foot and toes well on exam.   Assessment/Plan: 4 Days Post-Op Procedure(s) (LRB): TOTAL HIP ARTHROPLASTY (Right) Up with therapy Discharge home with home health Procedure(s) (LRB): TOTAL HIP ARTHROPLASTY (Right) Past Medical History  Diagnosis Date  . Bilateral leg pain   . HTN (hypertension)   . HLD (hyperlipidemia)   . Fatigue   . Lumbar back pain     with radiculopathy  . Rash     nonvesicular  . Routine general medical examination at a health care facility   . Screening for lipoid disorders   . Cataract   . Open-angle glaucoma   . PAD (peripheral artery disease)     with intermittent claudication, ABI 0.72 on Rt and 0.73 on L when assessed in 2010.   Marland Kitchen Arthritis     bilateral hips     Diet - heart healthy F/U - in 2 weeks PWB 25-50% right leg COD - Good  DVT Prophylaxis - Xarelto / Coumadin Protocol   Taylor Bush 10/07/2011, 9:53 AM

## 2011-10-20 ENCOUNTER — Other Ambulatory Visit: Payer: Self-pay | Admitting: Orthopedic Surgery

## 2011-12-14 ENCOUNTER — Encounter (HOSPITAL_COMMUNITY): Payer: Self-pay | Admitting: Pharmacy Technician

## 2011-12-14 ENCOUNTER — Encounter: Payer: Self-pay | Admitting: Family Medicine

## 2011-12-14 ENCOUNTER — Ambulatory Visit (INDEPENDENT_AMBULATORY_CARE_PROVIDER_SITE_OTHER): Payer: Medicare Other | Admitting: Family Medicine

## 2011-12-14 VITALS — BP 120/78 | HR 72 | Temp 97.7°F | Ht 63.0 in | Wt 138.8 lb

## 2011-12-14 DIAGNOSIS — M1612 Unilateral primary osteoarthritis, left hip: Secondary | ICD-10-CM

## 2011-12-14 DIAGNOSIS — Z01818 Encounter for other preprocedural examination: Secondary | ICD-10-CM

## 2011-12-14 DIAGNOSIS — Z79899 Other long term (current) drug therapy: Secondary | ICD-10-CM

## 2011-12-14 DIAGNOSIS — I10 Essential (primary) hypertension: Secondary | ICD-10-CM

## 2011-12-14 DIAGNOSIS — E785 Hyperlipidemia, unspecified: Secondary | ICD-10-CM

## 2011-12-14 DIAGNOSIS — M87052 Idiopathic aseptic necrosis of left femur: Secondary | ICD-10-CM

## 2011-12-14 DIAGNOSIS — D509 Iron deficiency anemia, unspecified: Secondary | ICD-10-CM

## 2011-12-14 DIAGNOSIS — Z1211 Encounter for screening for malignant neoplasm of colon: Secondary | ICD-10-CM

## 2011-12-14 DIAGNOSIS — M169 Osteoarthritis of hip, unspecified: Secondary | ICD-10-CM

## 2011-12-14 DIAGNOSIS — M87059 Idiopathic aseptic necrosis of unspecified femur: Secondary | ICD-10-CM

## 2011-12-14 NOTE — Patient Instructions (Signed)
F/u late summer / fall for full physical

## 2011-12-15 ENCOUNTER — Encounter: Payer: Self-pay | Admitting: Family Medicine

## 2011-12-15 DIAGNOSIS — Z96649 Presence of unspecified artificial hip joint: Secondary | ICD-10-CM

## 2011-12-15 HISTORY — DX: Presence of unspecified artificial hip joint: Z96.649

## 2011-12-15 LAB — IBC PANEL
Iron: 55 ug/dL (ref 42–145)
Saturation Ratios: 23.2 % (ref 20.0–50.0)
Transferrin: 169.3 mg/dL — ABNORMAL LOW (ref 212.0–360.0)

## 2011-12-15 LAB — BASIC METABOLIC PANEL
BUN: 18 mg/dL (ref 6–23)
CO2: 22 mEq/L (ref 19–32)
Calcium: 9.4 mg/dL (ref 8.4–10.5)
Chloride: 110 mEq/L (ref 96–112)
Creatinine, Ser: 1.4 mg/dL — ABNORMAL HIGH (ref 0.4–1.2)

## 2011-12-15 LAB — HEPATIC FUNCTION PANEL
Alkaline Phosphatase: 61 U/L (ref 39–117)
Bilirubin, Direct: 0 mg/dL (ref 0.0–0.3)
Total Bilirubin: 0.3 mg/dL (ref 0.3–1.2)
Total Protein: 7.2 g/dL (ref 6.0–8.3)

## 2011-12-15 LAB — CBC WITH DIFFERENTIAL/PLATELET
Basophils Absolute: 0.1 10*3/uL (ref 0.0–0.1)
Eosinophils Absolute: 0.3 10*3/uL (ref 0.0–0.7)
Lymphocytes Relative: 19 % (ref 12.0–46.0)
MCHC: 34.2 g/dL (ref 30.0–36.0)
MCV: 95.3 fl (ref 78.0–100.0)
Monocytes Absolute: 0.6 10*3/uL (ref 0.1–1.0)
Neutrophils Relative %: 70.1 % (ref 43.0–77.0)
Platelets: 343 10*3/uL (ref 150.0–400.0)
RBC: 2.85 Mil/uL — ABNORMAL LOW (ref 3.87–5.11)
RDW: 16.1 % — ABNORMAL HIGH (ref 11.5–14.6)

## 2011-12-16 DIAGNOSIS — D509 Iron deficiency anemia, unspecified: Secondary | ICD-10-CM | POA: Insufficient documentation

## 2011-12-16 DIAGNOSIS — D638 Anemia in other chronic diseases classified elsewhere: Secondary | ICD-10-CM | POA: Insufficient documentation

## 2011-12-16 DIAGNOSIS — M87052 Idiopathic aseptic necrosis of left femur: Secondary | ICD-10-CM | POA: Insufficient documentation

## 2011-12-16 NOTE — Progress Notes (Signed)
Patient Name: Taylor Bush Date of Birth: Mar 29, 1946 Medical Record Number: [id number] Gender: female  Dear Dr. Ricki Rodriguez,  Thank you for having me see Taylor Bush in consultation today at [facility] at [facility] for her preoperative evaluation prior to her LEFT total hip arthroplasty..  As you may recall, she is a 66 y.o. year old female with a history of dramatic bilateral hip osteoarthritis and avascular necrosis. She is done quite well status post her RIGHT total hip arthroplasty. Now she is still having a very limited functionality due to her LEFT hip, and her pain has progressed significantly. She had a normal nuclear stress test in November, 2012. Her hypertension is well controlled now. She has been taking some iron for some iron deficiency anemia. She is otherwise feeling quite well with the exception of her LEFT hip.  Irving Shows  09/13/2011  9:56 AM  Signed  [facility] NUCLEAR MED [place] [address] [phone]  Cardiology Nuclear Med Study  Taylor Bush is a 66 y.o. female [id number] 05/01/1946   Nuclear Med Background Indication for Stress Test:  Evaluation for Ischemia, Pending Surgical Clearance: Hip replacement- Dr. Pilar Plate Aluisio, and Abnormal EKG History:  No previous documented CAD Cardiac Risk Factors: Claudication, History of Smoking, Hypertension, Lipids and PVD   Symptoms:  DOE and Palpitations   Nuclear Pre-Procedure Caffeine/Decaff Intake:  None  NPO After: 7:00pm    Lungs:  clear  IV 0.9% NS with Angio Cath:  22g   IV Site: R Antecubital  IV Started by:  Eliezer Lofts, EMT-P   Chest Size (in):  38  Cup Size: B   Height: 5\' 2"  (1.575 m)   Weight:  139 lb (63.05 kg)   BMI:  Body mass index is 25.42 kg/(m^2).  Tech Comments:  Lopressor held > 24 hours, per patient.     Nuclear Med Study 1 or 2 day study: 1 day   Stress Test Type:  Carlton Adam   Reading MD: Glori Bickers, MD   Order Authorizing Provider:   M.Cooper   Resting Radionuclide: Technetium 7m Tetrofosmin   Resting Radionuclide Dose: 11.0 mCi    Stress Radionuclide:  Technetium 48m Tetrofosmin   Stress Radionuclide Dose: 33.0 mCi            Stress Protocol Rest HR: 68  Stress HR: 103   Rest BP: 106/61  Stress BP: 126/57   Exercise Time (min): n/a  METS: n/a    Predicted Max HR: 155 bpm % Max HR: 66.45 bpm Rate Pressure Product: 12978    Dose of Adenosine (mg):  n/a  Dose of Lexiscan: 0.4 mg   Dose of Atropine (mg): n/a  Dose of Dobutamine: n/a mcg/kg/min (at max HR)   Stress Test Technologist: Perrin Maltese, EMT-P   Nuclear Technologist:  Annye Rusk, CNMT      Rest Procedure:  Myocardial perfusion imaging was performed at rest 45 minutes following the intravenous administration of Technetium 68m Tetrofosmin. Rest ECG: NSR  Stress Procedure:  The patient received IV Lexiscan 0.4 mg over 15-seconds.  Technetium 33m Tetrofosmin injected at 30-seconds.  There were no significant changes,+ dizziness,a funny feeling in her head, and rare pvcs/pac with Lexiscan.  Quantitative spect images were obtained after a 45 minute delay. Stress ECG: No significant change from baseline ECG  QPS Raw Data Images:  Normal; no motion artifact; normal heart/lung ratio. Stress Images:  Normal homogeneous uptake in all areas of the myocardium. Rest  Images:  Normal homogeneous uptake in all areas of the myocardium. Subtraction (SDS):  Normal Transient Ischemic Dilatation (Normal <1.22):  1.01 Lung/Heart Ratio (Normal <0.45):  0.29  Quantitative Gated Spect Images QGS EDV:  48 ml QGS ESV:  11 ml QGS cine images:  NL LV Function; NL Wall Motion QGS EF: 78%  Impression Exercise Capacity:  Lexiscan with no exercise. BP Response:  n/a Clinical Symptoms:  n/a ECG Impression:  No significant ECG changes with Lexiscan. Comparison with Prior Nuclear Study: No previous nuclear study performed  Overall Impression:  Normal stress nuclear  study.    Glori Bickers    Past Medical History  Diagnosis Date  . HTN (hypertension)   . HLD (hyperlipidemia)   . Lumbar back pain     with radiculopathy  . Cataract   . Open-angle glaucoma   . PAD (peripheral artery disease)     with intermittent claudication, ABI 0.72 on Rt and 0.73 on L when assessed in 2010.   Marland Kitchen Arthritis     bilateral hips   . Hx of total hip arthroplasty 12/15/2011    Past Surgical History  Procedure Date  . Cataract surgery 2/10  . Abnormal vascular studies 7/10    ABI's 70, R SFA > 50%^ blockage   . Other surgical history     ganglion cyst removed left wrist   . Total hip arthroplasty 10/03/2011    Procedure: TOTAL HIP ARTHROPLASTY;  Surgeon: Dione Plover Aluisio;  Location: [facility];  Service: Orthopedics;  Laterality: Right;    History   Social History  . Marital Status: Married    Spouse Name: N/A    Number of Children: N/A  . Years of Education: N/A   Social History Main Topics  . Smoking status: Former Smoker    Quit date: 11/07/2008  . Smokeless tobacco: None  . Alcohol Use: No  . Drug Use: No  . Sexually Active: None   Other Topics Concern  . 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     Family History  Problem Relation Age of Onset  . Heart attack Father     54s  . Stroke Neg Hx     also no PAD or aneurysm    Medications and Allergies reviewed  Allergies  Allergen Reactions  . Statins    Current Outpatient Prescriptions on File Prior to Visit  Medication Sig Dispense Refill  . lisinopril-hydrochlorothiazide (PRINZIDE,ZESTORETIC) 20-25 MG per tablet Take 1 tablet by mouth every morning.       . metoprolol (LOPRESSOR) 100 MG tablet Take 1 tablet (100 mg total) by mouth 2 (two) times daily.  60 tablet  11    Review of Systems:   Extensive LEFT hip pain. No numbness or tingling. No chest pain. No shortness of breath. No lightheadedness. No syncope. No  palpitations. Otherwise, the pertinent positives and negatives are listed above and in the HPI, otherwise a full review of systems has been reviewed and is negative unless noted positive.   Physical Examination: Filed Vitals:   12/14/11 1450  BP: 120/78  Pulse: 72  Temp: 97.7 F (36.5 C)  TempSrc: Oral  Height: 5\' 3"  (1.6 m)  Weight: 138 lb 12.8 oz (62.959 kg)  SpO2: 99%      GEN: WDWN, NAD, Non-toxic, A & O x 3 HEENT: Atraumatic, Normocephalic. Neck supple. No masses, No LAD. Ears and Nose: No external deformity. CV: RRR, No M/G/R.  No JVD. No thrill. No extra heart sounds. PULM: CTA B, no wheezes, crackles, rhonchi. No retractions. No resp. distress. No accessory muscle use. ABD: S, NT, ND, +BS. EXTR: No c/c/e NEURO walking with an antalgic gait, favoring L side extensively. Tapering LEFT side extensively. PSYCH: Normally interactive. Conversant. Not depressed or anxious appearing.  Calm demeanor.    Assessment and Plan: 1. Preoperative evaluation to rule out surgical contraindication    2. HYPERLIPIDEMIA  Hepatic function panel  3. HYPERTENSION  Basic metabolic panel  4. Encounter for long-term (current) use of other medications  CBC with Differential, Hepatic function panel  5. Special screening for malignant neoplasm of colon  Ambulatory referral to Gastroenterology  6. Avascular necrosis of hip    7. Osteoarthritis of left hip    8. Anemia, iron deficiency    9. Avascular necrosis of left femoral head       Impression:  Extensive left-sided osteoarthritis with avascular necrosis of the LEFT hip.   Recommendations: the patient still has some mild anemia, but she is asymptomatic. Recommend that CBCs be followed while the patient is in the hospital, and depending on blood loss postoperative, it is possible that she could need a transfusion.  Given her age, and persistent anemia, I have asked her to schedule a colonoscopy after she has healed from her hip  arthroplasty. From my standpoint, the potential benefit from a total hip arthroplasty on the LEFT outweighs the potential benefit.  We will see the patient back in 2-3 months..  CBC:    Component Value Date/Time   WBC 9.4 12/14/2011 1506   HGB 9.3* 12/14/2011 1506   HCT 27.2* 12/14/2011 1506   PLT 343.0 12/14/2011 1506   MCV 95.3 12/14/2011 1506   NEUTROABS 6.6 12/14/2011 1506   LYMPHSABS 1.8 12/14/2011 1506   MONOABS 0.6 12/14/2011 1506   EOSABS 0.3 12/14/2011 1506   BASOSABS 0.1 12/14/2011 1506    Comprehensive Metabolic Panel:    Component Value Date/Time   NA 139 12/14/2011 1506   K 4.5 12/14/2011 1506   CL 110 12/14/2011 1506   CO2 22 12/14/2011 1506   BUN 18 12/14/2011 1506   CREATININE 1.4* 12/14/2011 1506   GLUCOSE 82 12/14/2011 1506   CALCIUM 9.4 12/14/2011 1506   AST 13 12/14/2011 1506   ALT 9 12/14/2011 1506   ALKPHOS 61 12/14/2011 1506   BILITOT 0.3 12/14/2011 1506   PROT 7.2 12/14/2011 1506   ALBUMIN 3.6 12/14/2011 1506    Thank you for having [place] see [name] in consultation.  Feel free to contact me with any questions.  Nishika Parkhurst T. Cathern Tahir, MD, [place] at [facility] [address] [place], [place] [address] Phone: [phone] Fax: [phone]

## 2011-12-19 ENCOUNTER — Ambulatory Visit (HOSPITAL_COMMUNITY)
Admission: RE | Admit: 2011-12-19 | Discharge: 2011-12-19 | Disposition: A | Discharge status: Home / Self Care | Payer: Medicare Other | Source: Ambulatory Visit | Attending: Orthopedic Surgery | Admitting: Orthopedic Surgery

## 2011-12-19 ENCOUNTER — Encounter (HOSPITAL_COMMUNITY): Payer: Self-pay

## 2011-12-19 ENCOUNTER — Encounter (HOSPITAL_COMMUNITY)
Admission: RE | Admit: 2011-12-19 | Discharge: 2011-12-19 | Disposition: A | Discharge status: Home / Self Care | Payer: Medicare Other | Source: Ambulatory Visit | Attending: Orthopedic Surgery | Admitting: Orthopedic Surgery

## 2011-12-19 DIAGNOSIS — M87059 Idiopathic aseptic necrosis of unspecified femur: Secondary | ICD-10-CM | POA: Insufficient documentation

## 2011-12-19 DIAGNOSIS — Z01812 Encounter for preprocedural laboratory examination: Secondary | ICD-10-CM | POA: Insufficient documentation

## 2011-12-19 DIAGNOSIS — Z01818 Encounter for other preprocedural examination: Secondary | ICD-10-CM | POA: Insufficient documentation

## 2011-12-19 DIAGNOSIS — M25559 Pain in unspecified hip: Secondary | ICD-10-CM | POA: Insufficient documentation

## 2011-12-19 DIAGNOSIS — Z96649 Presence of unspecified artificial hip joint: Secondary | ICD-10-CM | POA: Insufficient documentation

## 2011-12-19 HISTORY — DX: Anemia, unspecified: D64.9

## 2011-12-19 HISTORY — DX: Encounter for other specified aftercare: Z51.89

## 2011-12-19 LAB — CBC
HCT: 28.8 % — ABNORMAL LOW (ref 36.0–46.0)
Hemoglobin: 9.5 g/dL — ABNORMAL LOW (ref 12.0–15.0)
MCH: 31.3 pg (ref 26.0–34.0)
MCHC: 33 g/dL (ref 30.0–36.0)
MCV: 94.7 fL (ref 78.0–100.0)
Platelets: 348 10*3/uL (ref 150–400)
RBC: 3.04 MIL/uL — ABNORMAL LOW (ref 3.87–5.11)
RDW: 15.5 % (ref 11.5–15.5)
WBC: 9.3 10*3/uL (ref 4.0–10.5)

## 2011-12-19 LAB — URINALYSIS, ROUTINE W REFLEX MICROSCOPIC
Glucose, UA: NEGATIVE mg/dL
Hgb urine dipstick: NEGATIVE
Protein, ur: NEGATIVE mg/dL
Specific Gravity, Urine: 1.02 (ref 1.005–1.030)

## 2011-12-19 LAB — URINE MICROSCOPIC-ADD ON

## 2011-12-19 LAB — COMPREHENSIVE METABOLIC PANEL
Albumin: 3.4 g/dL — ABNORMAL LOW (ref 3.5–5.2)
Alkaline Phosphatase: 70 U/L (ref 39–117)
BUN: 16 mg/dL (ref 6–23)
CO2: 23 mEq/L (ref 19–32)
Chloride: 103 mEq/L (ref 96–112)
GFR calc non Af Amer: 37 mL/min — ABNORMAL LOW (ref >90)
Potassium: 3.7 mEq/L (ref 3.5–5.1)
Total Bilirubin: 0.2 mg/dL — ABNORMAL LOW (ref 0.3–1.2)

## 2011-12-19 LAB — SURGICAL PCR SCREEN
MRSA, PCR: NEGATIVE
Staphylococcus aureus: NEGATIVE

## 2011-12-19 LAB — PROTIME-INR: Prothrombin Time: 12.5 seconds (ref 11.6–15.2)

## 2011-12-19 NOTE — Patient Instructions (Signed)
[place]  12/19/2011   Your procedure is scheduled on:  12/21/11 1030am-1215pm  Report to [facility] at 0830 AM.  Call this number if you have problems the morning of surgery: [phone]   Remember:   Do not eat food:After Midnight.  May have clear liquids:until Midnight .  Marland Kitchen  Take these medicines the morning of surgery with A SIP OF WATER:    Do not wear jewelry, make-up or nail polish.  Do not wear lotions, powders, or perfumes.  Do not shave 48 hours prior to surgery.  Do not bring valuables to the hospital.  Contacts, dentures or bridgework may not be worn into surgery.  Leave suitcase in the car. After surgery it may be brought to your room.  For patients admitted to the hospital, checkout time is 11:00 AM the day of discharge.     Special Instructions: CHG Shower Use Special Wash: 1/2 bottle night before surgery and 1/2 bottle morning of surgery. shower chin to toes with CHG.  Wash face and private parts with regular soap.     Please read over the following fact sheets that you were given: MRSA Information, Blood Transfusion Fact Sheet, Incentive Spirometry fact sheet, coughing and deep breathing exercises, leg exercises

## 2011-12-19 NOTE — Pre-Procedure Instructions (Signed)
12/19/11 pt would like a pneumonia vaccine in hospital if possible per pt.

## 2011-12-19 NOTE — Pre-Procedure Instructions (Signed)
12/19/11 1400pm Notified Lori at office of DR Aluisio of hgb result 9.5, abnormal u/a result and creatinine of 1.45.

## 2011-12-19 NOTE — Pre-Procedure Instructions (Signed)
Pt had iron lab work done on 12/14/11.  Noted in EPIC

## 2011-12-19 NOTE — Pre-Procedure Instructions (Signed)
11/12 CXR and EKG in [facility]  12/14/11 Clearance office visit with Dr Lorelei Pont in [place] Test 09/13/11 in [place] with Dr Burt Knack (cards) in [facility]

## 2011-12-20 NOTE — H&P (Signed)
Taylor Bush DOB: Oct 27, 1946  Chief Complaint: Left hip pain  History of Present Illness The patient is a 66 year old female who comes in today for a preoperative History and Physical. The patient is scheduled for a left total hip arthroplasty to be performed by Dr. Dione Plover. Aluisio, MD at [facility] on Wednesday December 21, 2011 . She was noted to have severe osteonecrosis of both hips, right more symptomatic than the left. She had focal lapse of the right femoral head with bone on bone changes. She has had markedly progressive worsening of pain starting about a year ago. For the past 6 months, it has been intolerable. The pain is in her groin and lateral hip. It radiates to her knee. She is having a very difficult time walking. She has pain with activity and at rest. She has difficulty with activities of daily living. She has had analgesics including Oxycodone and it is not controlling her pain. She is not a candidate for physical therapy given her significant debility. She had her right hip replaced on 11/26. PCP: Dr. Frederico Hamman Copland Cardiologist: Dr. Sherren Mocha- cleared for surgery  RADIOGRAPHS: AP of the pelvis, AP and lateral of the right hip shows the prosthesis to be in excellent position. No periprosthetic abnormalities. The left hip is showing severe erosion to the femoral head with bone on bone change and loss of about 1/3 of the femoral head.  Past MedicalHistory Hemorrhoids Hypertension Varicose veins Impaired Vision S/P Right total hip arthroplasty (V43.64) AVN (avascular necrosis of bone) (733.40) Lumbago (724.2). 07/08/2009 Degeneration, lumbar/lumbosacral disc (722.52).  Osteoarthrosis NOS, other spec site (715.98).  High blood pressure Glaucoma Osteoarthritis  Allergies Sulfanomides PredniSONE    Family History Hypertension. mother and father Father. Living, In good health. 22 Mother. Deceased, Congestive Heart Failure.  58 Sister 1. Living, In good health.   Social History Current work status. retired Careers information officer. 2 Living situation. live with spouse Alcohol use. never consumed alcohol Drug/Alcohol Rehab (Currently). no Exercise. Exercises rarely Marital status. married Pain Contract. yes Copy of Drug/Alcohol Rehab (Previously). no Post-Surgical Plans. Plans for home after surgery Illicit drug use. no Tobacco / smoke exposure. yes Tobacco use. 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 none   Medication History Norco (5-325MG  Tablet, 1-2 Oral po q4-6h prn pain, Lisinopril-Hydrochlorothiazide (20-25MG  Tablet, Oral daily) Active. AmLODIPine Besylate (10 mg daily) Specific dose unknown - Active. Metoprolol Succinate (100 mg Oral two times daily) Specific dose unknown - Active.   Past Surgical History Tubal Ligation Total Hip Replacement - Right   Review of Systems General:Not Present- Chills, Fever, Night Sweats, Fatigue, Weight Gain, Weight Loss and Memory Loss. Skin:Not Present- Hives, Itching, Rash, Eczema and Lesions. HEENT:Present- Wears glasses/contact lenses. Not Present- Tinnitus, Headache, Double Vision, Visual Loss, Hearing Loss and Dentures. Respiratory:Not Present- Shortness of breath with exertion, Shortness of breath at rest, Allergies, Coughing up blood and Chronic Cough. Cardiovascular:Not Present- Chest Pain, Racing/skipping heartbeats, Difficulty Breathing Lying Down, Murmur, Swelling and Palpitations. Gastrointestinal:Not Present- Bloody Stool, Heartburn, Abdominal Pain, Vomiting, Nausea, Constipation, Diarrhea, Difficulty Swallowing, Jaundice and Loss of appetitie. Female Genitourinary:Not Present- Blood in Urine, Urinary frequency, Weak urinary stream, Discharge, Flank Pain, Incontinence, Painful Urination, Urgency, Urinary Retention and Urinating at Night. Musculoskeletal:Present- Joint Pain. Not Present- Muscle Weakness,  Muscle Pain, Joint Swelling, Back Pain, Morning Stiffness and Spasms. Neurological:Not Present- Tremor, Dizziness, Blackout spells, Paralysis, Difficulty with balance and Weakness. Psychiatric:Not Present- Insomnia.   Vitals Weight: 138 lb Height: 63 in Body  Surface Area: 1.67 m Body Mass Index: 24.45 kg/m Pulse: 82 (Regular) Resp.: 18 (Unlabored) BP: 145/61 (Sitting, Left Arm, Standard)    Physical Exam General Mental Status - Alert, cooperative and good historian. General Appearance- pleasant. Not in acute distress. Orientation- Oriented X3. Build & Nutrition- Well nourished and Well developed. Head and Neck Head- normocephalic, atraumatic . NeckGlobal Assessment- supple. no bruit auscultated on the right and no bruit auscultated on the left. Eye Pupil- Bilateral- PERRLA. Motion- Bilateral- EOMI. Chest and Lung Exam Auscultation: Breath sounds:- clear at anterior chest wall and - clear at posterior chest wall. Adventitious sounds:- No Adventitious sounds. Cardiovascular Auscultation:Rhythm- Regular rate and rhythm. Heart Sounds- S1 WNL and S2 WNL. Murmurs & Other Heart Sounds:Auscultation of the heart reveals - No Murmurs. Abdomen Palpation/Percussion:Tenderness- Abdomen is non-tender to palpation. Rigidity (guarding)- Abdomen is soft. Auscultation:Auscultation of the abdomen reveals - Bowel sounds normal. Female Genitourinary Not done, not pertinent to present illness Peripheral Vascular Upper Extremity: Palpation:- Pulses bilaterally normal. Lower Extremity: Palpation:Edema- Left- 1+ Pitting edema. Right- No edema. Neurologic Examination of related systems reveals - normal muscle strength and tone in all extremities. Neurologic evaluation reveals - normal sensation and upper and lower extremity deep tendon reflexes intact bilaterally . Musculoskeletal The right hip can be flexed to 110, rotated in 20, out 30 and  abducted to 30 without discomfort. The left hip has flexion to 90. No rotation and only 20 abduction with pain.   Assessment & Plan AVN (avascular necrosis of bone) (733.40) Right Total Hip Arthroplasty  At this point, the most predictable means of improving pain and function is total hip arthroplasty. The procedure, risks, potential complications and rehab course are discussed in detail and the patient elects to proceed. The goals of this procedure are decreased pain and increased function. There is a high liklihood that both of these goals will be achieved.   Ardeen Jourdain, PA-C

## 2011-12-21 ENCOUNTER — Encounter (HOSPITAL_COMMUNITY): Payer: Self-pay | Admitting: Anesthesiology

## 2011-12-21 ENCOUNTER — Inpatient Hospital Stay (HOSPITAL_COMMUNITY): Payer: Medicare Other | Admitting: Anesthesiology

## 2011-12-21 ENCOUNTER — Encounter (HOSPITAL_COMMUNITY)
Admission: RE | Disposition: A | Discharge status: Home Health | Payer: Self-pay | Source: Ambulatory Visit | Attending: Orthopedic Surgery

## 2011-12-21 ENCOUNTER — Encounter (HOSPITAL_COMMUNITY): Payer: Self-pay | Admitting: *Deleted

## 2011-12-21 ENCOUNTER — Inpatient Hospital Stay (HOSPITAL_COMMUNITY): Payer: Medicare Other

## 2011-12-21 ENCOUNTER — Inpatient Hospital Stay (HOSPITAL_COMMUNITY)
Admission: RE | Admit: 2011-12-21 | Discharge: 2011-12-24 | DRG: 470 | Disposition: A | Discharge status: Home Health | Payer: Medicare Other | Source: Ambulatory Visit | Attending: Orthopedic Surgery | Admitting: Orthopedic Surgery

## 2011-12-21 DIAGNOSIS — M87059 Idiopathic aseptic necrosis of unspecified femur: Principal | ICD-10-CM | POA: Diagnosis present

## 2011-12-21 DIAGNOSIS — M8970 Major osseous defect, unspecified site: Secondary | ICD-10-CM | POA: Diagnosis present

## 2011-12-21 DIAGNOSIS — D62 Acute posthemorrhagic anemia: Secondary | ICD-10-CM | POA: Diagnosis not present

## 2011-12-21 DIAGNOSIS — E871 Hypo-osmolality and hyponatremia: Secondary | ICD-10-CM | POA: Diagnosis not present

## 2011-12-21 DIAGNOSIS — I10 Essential (primary) hypertension: Secondary | ICD-10-CM | POA: Diagnosis present

## 2011-12-21 DIAGNOSIS — Z96649 Presence of unspecified artificial hip joint: Secondary | ICD-10-CM

## 2011-12-21 DIAGNOSIS — Z9289 Personal history of other medical treatment: Secondary | ICD-10-CM

## 2011-12-21 HISTORY — PX: TOTAL HIP ARTHROPLASTY: SHX124

## 2011-12-21 SURGERY — ARTHROPLASTY, HIP, TOTAL,POSTERIOR APPROACH
Anesthesia: General | Site: Hip | Laterality: Left | Wound class: Clean

## 2011-12-21 MED ORDER — LACTATED RINGERS IV SOLN
INTRAVENOUS | Status: DC
  Administered 2011-12-21: 14:00:00 via INTRAVENOUS

## 2011-12-21 MED ORDER — ROCURONIUM BROMIDE 100 MG/10ML IV SOLN
INTRAVENOUS | Status: DC | PRN
  Administered 2011-12-21: 50 mg via INTRAVENOUS

## 2011-12-21 MED ORDER — HYDROMORPHONE HCL PF 1 MG/ML IJ SOLN
0.2500 mg | INTRAMUSCULAR | Status: DC | PRN
  Administered 2011-12-21 (×3): 0.5 mg via INTRAVENOUS

## 2011-12-21 MED ORDER — METHOCARBAMOL 100 MG/ML IJ SOLN
500.0000 mg | Freq: Four times a day (QID) | INTRAMUSCULAR | Status: DC | PRN
  Administered 2011-12-21: 500 mg via INTRAVENOUS
  Filled 2011-12-21: qty 5

## 2011-12-21 MED ORDER — WARFARIN SODIUM 5 MG PO TABS
5.0000 mg | ORAL_TABLET | Freq: Once | ORAL | Status: AC
  Administered 2011-12-21: 5 mg via ORAL
  Filled 2011-12-21: qty 1

## 2011-12-21 MED ORDER — LACTATED RINGERS IV SOLN
INTRAVENOUS | Status: DC
  Administered 2011-12-21: 1000 mL via INTRAVENOUS
  Administered 2011-12-21: 12:00:00 via INTRAVENOUS

## 2011-12-21 MED ORDER — MIDAZOLAM HCL 5 MG/5ML IJ SOLN
INTRAMUSCULAR | Status: DC | PRN
  Administered 2011-12-21: 2 mg via INTRAVENOUS

## 2011-12-21 MED ORDER — METOPROLOL TARTRATE 100 MG PO TABS
100.0000 mg | ORAL_TABLET | Freq: Two times a day (BID) | ORAL | Status: DC
  Administered 2011-12-21 – 2011-12-24 (×4): 100 mg via ORAL
  Filled 2011-12-21 (×7): qty 1

## 2011-12-21 MED ORDER — BISACODYL 10 MG RE SUPP: 10.0000 mg | Freq: Every day | RECTAL | Status: DC | PRN

## 2011-12-21 MED ORDER — BUPIVACAINE LIPOSOME 1.3 % IJ SUSP
20.0000 mL | Freq: Once | INTRAMUSCULAR | Status: DC
  Filled 2011-12-21: qty 20

## 2011-12-21 MED ORDER — TEMAZEPAM 15 MG PO CAPS: 15.0000 mg | ORAL_CAPSULE | Freq: Every evening | ORAL | Status: DC | PRN

## 2011-12-21 MED ORDER — FENTANYL CITRATE 0.05 MG/ML IJ SOLN
25.0000 ug | INTRAMUSCULAR | Status: DC | PRN
  Administered 2011-12-21 (×3): 50 ug via INTRAVENOUS

## 2011-12-21 MED ORDER — LIDOCAINE HCL (CARDIAC) 20 MG/ML IV SOLN
INTRAVENOUS | Status: DC | PRN
  Administered 2011-12-21: 100 mg via INTRAVENOUS

## 2011-12-21 MED ORDER — CHLORHEXIDINE GLUCONATE 4 % EX LIQD: 60.0000 mL | Freq: Once | CUTANEOUS | Status: DC

## 2011-12-21 MED ORDER — GLYCOPYRROLATE 0.2 MG/ML IJ SOLN
INTRAMUSCULAR | Status: DC | PRN
  Administered 2011-12-21: .6 mg via INTRAVENOUS

## 2011-12-21 MED ORDER — KCL IN DEXTROSE-NACL 20-5-0.9 MEQ/L-%-% IV SOLN
INTRAVENOUS | Status: DC
  Administered 2011-12-21 – 2011-12-23 (×3): via INTRAVENOUS
  Filled 2011-12-21 (×5): qty 1000

## 2011-12-21 MED ORDER — 0.9 % SODIUM CHLORIDE (POUR BTL) OPTIME
TOPICAL | Status: DC | PRN
  Administered 2011-12-21: 1000 mL

## 2011-12-21 MED ORDER — DIPHENHYDRAMINE HCL 12.5 MG/5ML PO ELIX: 12.5000 mg | ORAL_SOLUTION | ORAL | Status: DC | PRN

## 2011-12-21 MED ORDER — PHENOL 1.4 % MT LIQD: 1.0000 | OROMUCOSAL | Status: DC | PRN

## 2011-12-21 MED ORDER — CEFAZOLIN SODIUM 1-5 GM-% IV SOLN
1.0000 g | INTRAVENOUS | Status: AC
  Administered 2011-12-21: 1 g via INTRAVENOUS

## 2011-12-21 MED ORDER — SODIUM CHLORIDE 0.9 % IV SOLN: INTRAVENOUS | Status: DC

## 2011-12-21 MED ORDER — PROPOFOL 10 MG/ML IV EMUL
INTRAVENOUS | Status: DC | PRN
  Administered 2011-12-21: 180 mg via INTRAVENOUS

## 2011-12-21 MED ORDER — PROMETHAZINE HCL 25 MG/ML IJ SOLN: 6.2500 mg | INTRAMUSCULAR | Status: DC | PRN

## 2011-12-21 MED ORDER — METOCLOPRAMIDE HCL 10 MG PO TABS: 5.0000 mg | ORAL_TABLET | Freq: Three times a day (TID) | ORAL | Status: DC | PRN

## 2011-12-21 MED ORDER — DOCUSATE SODIUM 100 MG PO CAPS
100.0000 mg | ORAL_CAPSULE | Freq: Two times a day (BID) | ORAL | Status: DC
  Administered 2011-12-21 – 2011-12-24 (×6): 100 mg via ORAL
  Filled 2011-12-21 (×8): qty 1

## 2011-12-21 MED ORDER — WARFARIN VIDEO
Freq: Once | Status: AC
  Administered 2011-12-22: 12:00:00

## 2011-12-21 MED ORDER — POLYETHYLENE GLYCOL 3350 17 G PO PACK
17.0000 g | PACK | Freq: Every day | ORAL | Status: DC | PRN
  Filled 2011-12-21: qty 1

## 2011-12-21 MED ORDER — DEXAMETHASONE SODIUM PHOSPHATE 10 MG/ML IJ SOLN
10.0000 mg | Freq: Once | INTRAMUSCULAR | Status: DC
  Filled 2011-12-21: qty 1

## 2011-12-21 MED ORDER — ONDANSETRON HCL 4 MG/2ML IJ SOLN
INTRAMUSCULAR | Status: DC | PRN
  Administered 2011-12-21: 4 mg via INTRAVENOUS

## 2011-12-21 MED ORDER — ACETAMINOPHEN 650 MG RE SUPP: 650.0000 mg | Freq: Four times a day (QID) | RECTAL | Status: DC | PRN

## 2011-12-21 MED ORDER — MORPHINE SULFATE 2 MG/ML IJ SOLN
INTRAMUSCULAR | Status: AC
  Administered 2011-12-21: 2 mg via INTRAVENOUS
  Filled 2011-12-21: qty 1

## 2011-12-21 MED ORDER — COUMADIN BOOK
Freq: Once | Status: AC
  Administered 2011-12-21: 21:00:00
  Filled 2011-12-21: qty 1

## 2011-12-21 MED ORDER — OXYCODONE HCL 5 MG PO TABS
5.0000 mg | ORAL_TABLET | ORAL | Status: DC | PRN
Start: 2011-12-21 — End: 2011-12-24
  Administered 2011-12-21: 5 mg via ORAL
  Administered 2011-12-21 – 2011-12-24 (×16): 10 mg via ORAL
  Filled 2011-12-21 (×16): qty 2
  Filled 2011-12-21: qty 1

## 2011-12-21 MED ORDER — AMLODIPINE BESYLATE 10 MG PO TABS
10.0000 mg | ORAL_TABLET | Freq: Every day | ORAL | Status: DC
  Administered 2011-12-22 – 2011-12-24 (×2): 10 mg via ORAL
  Filled 2011-12-21 (×3): qty 1

## 2011-12-21 MED ORDER — CEFAZOLIN SODIUM 1-5 GM-% IV SOLN
1.0000 g | Freq: Four times a day (QID) | INTRAVENOUS | Status: AC
  Administered 2011-12-21 – 2011-12-22 (×3): 1 g via INTRAVENOUS
  Filled 2011-12-21 (×3): qty 50

## 2011-12-21 MED ORDER — ONDANSETRON HCL 4 MG PO TABS: 4.0000 mg | ORAL_TABLET | Freq: Four times a day (QID) | ORAL | Status: DC | PRN

## 2011-12-21 MED ORDER — ACETAMINOPHEN 325 MG PO TABS: 650.0000 mg | ORAL_TABLET | Freq: Four times a day (QID) | ORAL | Status: DC | PRN

## 2011-12-21 MED ORDER — FLEET ENEMA 7-19 GM/118ML RE ENEM: 1.0000 | ENEMA | Freq: Once | RECTAL | Status: AC | PRN

## 2011-12-21 MED ORDER — ACETAMINOPHEN 10 MG/ML IV SOLN
1000.0000 mg | Freq: Once | INTRAVENOUS | Status: AC
  Administered 2011-12-21: 1000 mg via INTRAVENOUS

## 2011-12-21 MED ORDER — ACETAMINOPHEN 10 MG/ML IV SOLN
1000.0000 mg | Freq: Four times a day (QID) | INTRAVENOUS | Status: AC
  Administered 2011-12-21 – 2011-12-22 (×4): 1000 mg via INTRAVENOUS
  Filled 2011-12-21 (×5): qty 100

## 2011-12-21 MED ORDER — FENTANYL CITRATE 0.05 MG/ML IJ SOLN
INTRAMUSCULAR | Status: DC | PRN
  Administered 2011-12-21 (×3): 50 ug via INTRAVENOUS
  Administered 2011-12-21: 100 ug via INTRAVENOUS

## 2011-12-21 MED ORDER — METOCLOPRAMIDE HCL 5 MG/ML IJ SOLN: 5.0000 mg | Freq: Three times a day (TID) | INTRAMUSCULAR | Status: DC | PRN

## 2011-12-21 MED ORDER — MENTHOL 3 MG MT LOZG: 1.0000 | LOZENGE | OROMUCOSAL | Status: DC | PRN

## 2011-12-21 MED ORDER — METHOCARBAMOL 500 MG PO TABS
500.0000 mg | ORAL_TABLET | Freq: Four times a day (QID) | ORAL | Status: DC | PRN
  Administered 2011-12-21 – 2011-12-23 (×4): 500 mg via ORAL
  Filled 2011-12-21 (×4): qty 1

## 2011-12-21 MED ORDER — BUPIVACAINE LIPOSOME 1.3 % IJ SUSP
20.0000 mL | INTRAMUSCULAR | Status: AC
  Administered 2011-12-21: 20 mL
  Filled 2011-12-21: qty 20

## 2011-12-21 MED ORDER — MORPHINE SULFATE 2 MG/ML IJ SOLN
1.0000 mg | INTRAMUSCULAR | Status: DC | PRN
  Administered 2011-12-21 (×2): 2 mg via INTRAVENOUS
  Administered 2011-12-22: 1 mg via INTRAVENOUS
  Administered 2011-12-22 (×4): 2 mg via INTRAVENOUS
  Filled 2011-12-21 (×7): qty 1

## 2011-12-21 MED ORDER — ONDANSETRON HCL 4 MG/2ML IJ SOLN: 4.0000 mg | Freq: Four times a day (QID) | INTRAMUSCULAR | Status: DC | PRN

## 2011-12-21 MED ORDER — NEOSTIGMINE METHYLSULFATE 1 MG/ML IJ SOLN
INTRAMUSCULAR | Status: DC | PRN
  Administered 2011-12-21: 3 mg via INTRAVENOUS

## 2011-12-21 SURGICAL SUPPLY — 49 items
BAG SPEC THK2 15X12 ZIP CLS (MISCELLANEOUS) ×1
BAG ZIPLOCK 12X15 (MISCELLANEOUS) ×2 IMPLANT
BIT DRILL 2.8X128 (BIT) ×2 IMPLANT
BLADE EXTENDED COATED 6.5IN (ELECTRODE) ×2 IMPLANT
BLADE SAW SAG 73X25 THK (BLADE) ×1
BLADE SAW SGTL 73X25 THK (BLADE) ×1 IMPLANT
CLOSURE STERI STRIP 1/2 X4 (GAUZE/BANDAGES/DRESSINGS) ×2 IMPLANT
CLOTH BEACON ORANGE TIMEOUT ST (SAFETY) ×2 IMPLANT
DRAPE INCISE IOBAN 66X45 STRL (DRAPES) ×2 IMPLANT
DRAPE ORTHO SPLIT 77X108 STRL (DRAPES) ×4
DRAPE POUCH INSTRU U-SHP 10X18 (DRAPES) ×2 IMPLANT
DRAPE SURG ORHT 6 SPLT 77X108 (DRAPES) ×2 IMPLANT
DRAPE U-SHAPE 47X51 STRL (DRAPES) ×2 IMPLANT
DRSG ADAPTIC 3X8 NADH LF (GAUZE/BANDAGES/DRESSINGS) ×2 IMPLANT
DRSG MEPILEX BORDER 4X4 (GAUZE/BANDAGES/DRESSINGS) ×2 IMPLANT
DRSG MEPILEX BORDER 4X8 (GAUZE/BANDAGES/DRESSINGS) ×2 IMPLANT
DURAPREP 26ML APPLICATOR (WOUND CARE) ×2 IMPLANT
ELECT REM PT RETURN 9FT ADLT (ELECTROSURGICAL) ×2
ELECTRODE REM PT RTRN 9FT ADLT (ELECTROSURGICAL) ×1 IMPLANT
EVACUATOR 1/8 PVC DRAIN (DRAIN) ×2 IMPLANT
FACESHIELD LNG OPTICON STERILE (SAFETY) ×8 IMPLANT
GLOVE BIO SURGEON STRL SZ7.5 (GLOVE) ×2 IMPLANT
GLOVE BIO SURGEON STRL SZ8 (GLOVE) ×2 IMPLANT
GLOVE BIOGEL PI IND STRL 8 (GLOVE) ×2 IMPLANT
GLOVE BIOGEL PI INDICATOR 8 (GLOVE) ×2
GOWN STRL NON-REIN LRG LVL3 (GOWN DISPOSABLE) ×2 IMPLANT
GOWN STRL REIN XL XLG (GOWN DISPOSABLE) ×2 IMPLANT
IMMOBILIZER KNEE 20 (SOFTGOODS) ×2
IMMOBILIZER KNEE 20 THIGH 36 (SOFTGOODS) IMPLANT
MANIFOLD NEPTUNE II (INSTRUMENTS) ×2 IMPLANT
NDL SAFETY ECLIPSE 18X1.5 (NEEDLE) ×1 IMPLANT
NEEDLE HYPO 18GX1.5 SHARP (NEEDLE) ×2
NS IRRIG 1000ML POUR BTL (IV SOLUTION) ×2 IMPLANT
PACK TOTAL JOINT (CUSTOM PROCEDURE TRAY) ×2 IMPLANT
PASSER SUT SWANSON 36MM LOOP (INSTRUMENTS) ×2 IMPLANT
POSITIONER SURGICAL ARM (MISCELLANEOUS) ×2 IMPLANT
SPONGE GAUZE 4X4 12PLY (GAUZE/BANDAGES/DRESSINGS) ×2 IMPLANT
STRIP CLOSURE SKIN 1/2X4 (GAUZE/BANDAGES/DRESSINGS) ×4 IMPLANT
SUT ETHIBOND NAB CT1 #1 30IN (SUTURE) ×4 IMPLANT
SUT MNCRL AB 4-0 PS2 18 (SUTURE) ×2 IMPLANT
SUT VIC AB 1 CT1 27 (SUTURE) ×6
SUT VIC AB 1 CT1 27XBRD ANTBC (SUTURE) ×3 IMPLANT
SUT VIC AB 2-0 CT1 27 (SUTURE) ×6
SUT VIC AB 2-0 CT1 TAPERPNT 27 (SUTURE) ×3 IMPLANT
SYR 50ML LL SCALE MARK (SYRINGE) ×2 IMPLANT
TOWEL OR 17X26 10 PK STRL BLUE (TOWEL DISPOSABLE) ×4 IMPLANT
TOWEL OR NON WOVEN STRL DISP B (DISPOSABLE) ×2 IMPLANT
TRAY FOLEY CATH 14FRSI W/METER (CATHETERS) ×2 IMPLANT
WATER STERILE IRR 1500ML POUR (IV SOLUTION) ×2 IMPLANT

## 2011-12-21 NOTE — Op Note (Signed)
Pre-operative diagnosis- AVN Left hip  Post-operative diagnosis-AVN  Left hip  Procedure-  LeftTotal Hip Arthroplasty  Surgeon- Taylor Bush. Taylor Luther, MD  Assistant- Taylor Muslim, PA-C   Anesthesia  General  EBL- 200   Drain Hemovac   Complication- None  Condition-PACU - hemodynamically stable.   Brief Clinical Note-  Taylor Bush is a 66 y.o. female with end stage AVN of her left hip with progressively worsening pain and dysfunction. Pain occurs with activity and rest including pain at night. She has tried analgesics, protected weight bearing and rest without benefit. Pain is too severe to attempt physical therapy. Radiographs demonstrate bone on bone arthritis with severe collapse of the femoral head. She presents now for left THA.  Procedure in detail-   The patient is brought into the operating room and placed on the operating table. After successful administration of General  anesthesia, the patient is placed in the  Right lateral decubitus position with the  Left side up and held in place with the hip positioner. The lower extremity is isolated from the perineum with plastic drapes and time-out is performed by the surgical team. The lower extremity is then prepped and draped in the usual sterile fashion. A short posterolateral incision is made with a ten blade through the subcutaneous tissue to the level of the fascia lata which is incised in line with the skin incision. The sciatic nerve is palpated and protected and the short external rotators and capsule are isolated from the femur. The hip is then dislocated and the center of the femoral head is marked. A trial prosthesis is placed such that the trial head corresponds to the center of the patients' native femoral head. The resection level is marked on the femoral neck and the resection is made with an oscillating saw. The femoral head is removed and femoral retractors placed to gain access to the femoral canal.      The canal finder is  passed into the femoral canal and the canal is thoroughly irrigated with sterile saline to remove the fatty contents. Axial reaming is performed to 13.5  mm, proximal reaming to 18D  and the sleeve machined to a small. A 18 D small trial sleeve is placed into the proximal femur.      The femur is then retracted anteriorly to gain acetabular exposure. Acetabular retractors are placed and the labrum and osteophytes are removed, Acetabular reaming is performed to 49  mm and a 50  mm Pinnacle acetabular shell is placed in anatomic position with excellent purchase. Additional dome screws were placed. An apex hole eliminator is placed and the permanent 32 mm neutral + 4 Marathon liner is placed into the acetabular shell.      The trial femur is then placed into the femoral canal. The size is 18 x 13  stem with a 36+ 8  neck and a 32 + 0 head with the neck version matching  the patients' native anteversion. The hip is reduced with excellent stability with full extension and full external rotation, 70 degrees flexion with 40 degrees adduction and 90 degrees internal rotation and 90 degrees of flexion with 70 degrees of internal rotation. The operative leg is placed on top of the non-operative leg and the leg lengths are found to be equal. The trials are then removed and the permanent implant of the same size is impacted into the femoral canal. The ceramic femoral head of the same size as the trial is placed and the hip  is reduced with the same stability parameters. The operative leg is again placed on top of the non-operative leg and the leg lengths are found to be equal.      The wound is then copiously irrigated with saline solution and the capsule and short external rotators are re-attached to the femur through drill holes with Ethibond suture. The fascia lata is closed over a hemovac drain with #1 vicryl suture and the fascia lata, gluteal muscles and subcutaneous tissues are injected with Exparel 62ml diluted with  saline 108ml. The subcutaneous tissues are closed with #1 and2-0 vicryl and the subcuticular layer closed with running 4-0 Monocryl. The drain is hooked to suction, incision cleaned and dried, and steri-srips and a bulky sterile dressing applied. The limb is placed into a knee immobilizer and the patient is awakened and transported to recovery in stable condition.      Please note that a surgical assistant was a medical necessity for this procedure in order to perform it in a safe and expeditious manner. The assistant was necessary to provide retraction to the vital neurovascular structures and to retract and position the limb to allow for anatomic placement of the prosthetic components.  Taylor Bush Amiri Riechers, MD    12/21/2011, 12:21 PM

## 2011-12-21 NOTE — Anesthesia Postprocedure Evaluation (Signed)
Anesthesia Post Note  Patient: Taylor Bush  Procedure(s) Performed: Procedure(s) (LRB): TOTAL HIP ARTHROPLASTY (Left)  Anesthesia type: General  Patient location: PACU  Post pain: Pain level controlled  Post assessment: Post-op Vital signs reviewed  Last Vitals:  Filed Vitals:   12/21/11 1345  BP: 112/60  Pulse: 60  Temp:   Resp: 13    Post vital signs: Reviewed  Level of consciousness: sedated  Complications: No apparent anesthesia complications

## 2011-12-21 NOTE — Preoperative (Signed)
Beta Blockers   Reason not to administer Beta Blockers:took Metoprolol 12-21-11 at 0730

## 2011-12-21 NOTE — Anesthesia Preprocedure Evaluation (Addendum)
Anesthesia Evaluation  Patient identified by MRN, date of birth, ID band  Reviewed: Allergy & Precautions, H&P , NPO status , Patient's Chart, lab work & pertinent test results, reviewed documented beta blocker date and time   History of Anesthesia Complications Negative for: history of anesthetic complications  Airway Mallampati: II TM Distance: >3 FB Neck ROM: Full    Dental  (+) Teeth Intact and Chipped,    Pulmonary neg pulmonary ROS, former smoker clear to auscultation  Pulmonary exam normal       Cardiovascular Exercise Tolerance: Poor hypertension, Pt. on medications and Pt. on home beta blockers Regular Normal ASPVD with claudication   Neuro/Psych Negative Neurological ROS  Negative Psych ROS   GI/Hepatic negative GI ROS, Neg liver ROS,   Endo/Other  Negative Endocrine ROS  Renal/GU negative Renal ROS  Genitourinary negative   Musculoskeletal negative musculoskeletal ROS (+)   Abdominal   Peds  Hematology negative hematology ROS (+)   Anesthesia Other Findings   Reproductive/Obstetrics negative OB ROS                          Anesthesia Physical Anesthesia Plan  ASA: II  Anesthesia Plan: General   Post-op Pain Management:    Induction: Intravenous  Airway Management Planned: Oral ETT  Additional Equipment:   Intra-op Plan:   Post-operative Plan: Extubation in OR  Informed Consent: I have reviewed the patients History and Physical, chart, labs and discussed the procedure including the risks, benefits and alternatives for the proposed anesthesia with the patient or authorized representative who has indicated his/her understanding and acceptance.   Dental advisory given  Plan Discussed with: CRNA  Anesthesia Plan Comments:         Anesthesia Quick Evaluation

## 2011-12-21 NOTE — Interval H&P Note (Signed)
History and Physical Interval Note:  12/21/2011 10:57 AM  [name]  has presented today for surgery, with the diagnosis of Osteoarthritis of Left Hip  The various methods of treatment have been discussed with the patient and family. After consideration of risks, benefits and other options for treatment, the patient has consented to  Procedure(s) (LRB): TOTAL HIP ARTHROPLASTY (Left) as a surgical intervention .  The patients' history has been reviewed, patient examined, no change in status, stable for surgery.  I have reviewed the patients' chart and labs.  Questions were answered to the patient's satisfaction.     Taylor Bush

## 2011-12-21 NOTE — Progress Notes (Signed)
ANTICOAGULATION CONSULT NOTE - Initial Consult  Pharmacy Consult for Warfarin Indication: VTE prophylaxis s/p L THA  Allergies  Allergen Reactions  . Statins     Patient Measurements: Height: 5\' 3"  (160 cm) Weight: 144 lb 3.2 oz (65.409 kg) IBW/kg (Calculated) : 52.4   Vital Signs: Temp: 97.4 F (36.3 C) (02/13 1447) Temp src: Oral (02/13 1447) BP: 119/72 mmHg (02/13 1447) Pulse Rate: 54  (02/13 1447)  Labs:  Basename 12/19/11 1220 12/19/11 1120  HGB -- 9.5*  HCT -- 28.8*  PLT -- 348  APTT 32 --  LABPROT -- 12.5  INR -- 0.91  HEPARINUNFRC -- --  CREATININE 1.45* --  CKTOTAL -- --  CKMB -- --  TROPONINI -- --   Estimated Creatinine Clearance: 35.2 ml/min (by C-G formula based on Cr of 1.45).  Medical History: Past Medical History  Diagnosis Date  . HTN (hypertension)   . HLD (hyperlipidemia)   . Lumbar back pain     with radiculopathy  . Cataract   . Open-angle glaucoma   . PAD (peripheral artery disease)     with intermittent claudication, ABI 0.72 on Rt and 0.73 on L when assessed in 2010.   Marland Kitchen Arthritis     bilateral hips   . Hx of total hip arthroplasty 12/15/2011  . Anemia   . Blood transfusion     11/12     Medications:  Scheduled:    . acetaminophen  1,000 mg Intravenous Once  . acetaminophen  1,000 mg Intravenous Q6H  . amLODipine  10 mg Oral QAC breakfast  . bupivacaine liposome  20 mL Other To OR  .  ceFAZolin (ANCEF) IV  1 g Intravenous 60 min Pre-Op  .  ceFAZolin (ANCEF) IV  1 g Intravenous Q6H  . docusate sodium  100 mg Oral BID  . metoprolol  100 mg Oral BID  . DISCONTD: bupivacaine liposome  20 mL Infiltration Once  . DISCONTD: chlorhexidine  60 mL Topical Once  . DISCONTD: dexamethasone  10 mg Intravenous Once    Assessment: 66 yo female s/p L THA to begin warfarin post op for VTE prophylaxis.   Goal of Therapy:  INR 2-3   Plan:  1. 5mg  warfarin tonight 2. Daily INR 3. Warfarin education - video and booklet  Taylor Bush 12/21/2011,3:31 PM

## 2011-12-21 NOTE — Transfer of Care (Signed)
Immediate Anesthesia Transfer of Care Note  Patient: Taylor Bush  Procedure(s) Performed: Procedure(s) (LRB): TOTAL HIP ARTHROPLASTY (Left)  Patient Location: PACU  Anesthesia Type: General  Level of Consciousness: awake, alert , oriented and patient cooperative  Airway & Oxygen Therapy: Patient Spontanous Breathing and Patient connected to face mask oxygen  Post-op Assessment: Report given to PACU RN, Post -op Vital signs reviewed and stable and Patient moving all extremities  Post vital signs: Reviewed and stable  Complications: No apparent anesthesia complications

## 2011-12-22 DIAGNOSIS — Z9289 Personal history of other medical treatment: Secondary | ICD-10-CM

## 2011-12-22 DIAGNOSIS — D62 Acute posthemorrhagic anemia: Secondary | ICD-10-CM | POA: Diagnosis not present

## 2011-12-22 LAB — PROTIME-INR: Prothrombin Time: 13.9 seconds (ref 11.6–15.2)

## 2011-12-22 LAB — BASIC METABOLIC PANEL
BUN: 17 mg/dL (ref 6–23)
Chloride: 106 mEq/L (ref 96–112)
Creatinine, Ser: 1.14 mg/dL — ABNORMAL HIGH (ref 0.50–1.10)
GFR calc Af Amer: 57 mL/min — ABNORMAL LOW (ref >90)
GFR calc non Af Amer: 49 mL/min — ABNORMAL LOW (ref >90)
Potassium: 4.7 mEq/L (ref 3.5–5.1)

## 2011-12-22 LAB — CBC
MCHC: 33.6 g/dL (ref 30.0–36.0)
Platelets: 249 10*3/uL (ref 150–400)
RDW: 15.4 % (ref 11.5–15.5)
WBC: 9.2 10*3/uL (ref 4.0–10.5)

## 2011-12-22 LAB — PREPARE RBC (CROSSMATCH)

## 2011-12-22 MED ORDER — WARFARIN SODIUM 5 MG PO TABS
5.0000 mg | ORAL_TABLET | Freq: Once | ORAL | Status: AC
  Administered 2011-12-22: 5 mg via ORAL
  Filled 2011-12-22 (×2): qty 1

## 2011-12-22 MED ORDER — POLYSACCHARIDE IRON 150 MG PO CAPS
150.0000 mg | ORAL_CAPSULE | Freq: Two times a day (BID) | ORAL | Status: DC
  Administered 2011-12-22 – 2011-12-24 (×5): 150 mg via ORAL
  Filled 2011-12-22 (×6): qty 1

## 2011-12-22 MED FILL — Sodium Chloride Inj 0.9%: INTRAMUSCULAR | Qty: 50 | Status: AC

## 2011-12-22 NOTE — Progress Notes (Signed)
Physical Therapy Treatment Patient Details Name: Taylor Bush MRN: [id number] DOB: [birth year] Today's Date: 12/22/2011  PT Assessment/Plan  PT - Assessment/Plan Comments on Treatment Session: Patient progressing slowly.  Will need encouragement to increase foot flat on left due to limited mobility at heel cord PT Plan: Discharge plan remains appropriate PT Frequency: 7X/week Follow Up Recommendations: Home health PT Equipment Recommended: None recommended by PT PT Goals  Acute Rehab PT Goals Pt will go Supine/Side to Sit: with supervision PT Goal: Supine/Side to Sit - Progress: Progressing toward goal Pt will go Sit to Supine/Side: with supervision PT Goal: Sit to Supine/Side - Progress: Progressing toward goal Pt will go Sit to Stand: with supervision PT Goal: Sit to Stand - Progress: Progressing toward goal Pt will go Stand to Sit: with supervision PT Goal: Stand to Sit - Progress: Progressing toward goal Pt will Ambulate: 51 - 150 feet;with supervision;with rolling walker PT Goal: Ambulate - Progress: Progressing toward goal  PT Treatment Precautions/Restrictions  Precautions Precautions: Posterior Hip Required Braces or Orthoses: No Restrictions Weight Bearing Restrictions: Yes LLE Weight Bearing: Partial weight bearing LLE Partial Weight Bearing Percentage or Pounds: 25-50% Mobility (including Balance) Bed Mobility Supine to Sit: 4: Min assist Supine to Sit Details (indicate cue type and reason): min cues for technique Sitting - Scoot to Edge of Bed: 4: Min assist Sitting - Scoot to [employer] of Bed Details (indicate cue type and reason): for supporting left LE Sit to Supine: 3: Mod assist Sit to Supine - Details (indicate cue type and reason): for both LE's Transfers Sit to Stand: From bed;3: Mod assist;4: Min assist Sit to Stand Details (indicate cue type and reason): min-mod assist with cues and increased time  Stand to Sit: 4: Min assist;With upper extremity assist;To  bed Stand to Sit Details: cues for technique Ambulation/Gait Ambulation/Gait Assistance: 4: Min assist Ambulation/Gait Assistance Details (indicate cue type and reason): cues to try to get foot flat, patient unwillting to try today and reports walked on her toe since Nov surgery due to right leg longer. Ambulation Distance (Feet): 50 Feet Assistive device: Rolling walker Gait Pattern: Step-to pattern;Antalgic    Exercise    End of Session PT - End of Session Equipment Utilized During Treatment: Gait belt Activity Tolerance: Patient limited by fatigue Patient left: in bed;with call bell in reach General Behavior During Session: [facility] for tasks performed Cognition: [facility] for tasks performed  [facility] 12/22/2011, 5:27 PM

## 2011-12-22 NOTE — Progress Notes (Signed)
ANTICOAGULATION CONSULT NOTE - Follow up [place] for Warfarin Indication: VTE prophylaxis s/p L THA  Allergies  Allergen Reactions  . Statins     Patient Measurements: Height: 5\' 3"  (160 cm) Weight: 144 lb 3.2 oz (65.409 kg) IBW/kg (Calculated) : 52.4   Vital Signs: Temp: 99.9 F (37.7 C) (02/14 1349) Temp src: Oral (02/14 1349) BP: 112/71 mmHg (02/14 1349) Pulse Rate: 70  (02/14 1349)  Labs:  [facility] 12/22/11 0420  HGB 7.1*  HCT 21.1*  PLT 249  APTT --  LABPROT 13.9  INR 1.05  HEPARINUNFRC --  CREATININE 1.14*  CKTOTAL --  CKMB --  TROPONINI --   Estimated Creatinine Clearance: 44.7 ml/min (by C-G formula based on Cr of 1.14).  Medical History: Past Medical History  Diagnosis Date  . HTN (hypertension)   . HLD (hyperlipidemia)   . Lumbar back pain     with radiculopathy  . Cataract   . Open-angle glaucoma   . PAD (peripheral artery disease)     with intermittent claudication, ABI 0.72 on Rt and 0.73 on L when assessed in 2010.   Marland Kitchen Arthritis     bilateral hips   . Hx of total hip arthroplasty 12/15/2011  . Anemia   . Blood transfusion     11/12     Medications:  Scheduled:     . acetaminophen  1,000 mg Intravenous Q6H  . amLODipine  10 mg Oral QAC breakfast  .  ceFAZolin (ANCEF) IV  1 g Intravenous Q6H  . coumadin book   Does not apply Once  . docusate sodium  100 mg Oral BID  . metoprolol  100 mg Oral BID  . polysaccharide iron  150 mg Oral BID  . warfarin  5 mg Oral Once  . warfarin   Does not apply Once  . DISCONTD: bupivacaine liposome  20 mL Infiltration Once  . DISCONTD: chlorhexidine  60 mL Topical Once  . DISCONTD: dexamethasone  10 mg Intravenous Once    [name] on 2/13 with warfarin ordered post op for VTE prophylaxis.   POD #1, HGB is low due to postop acute blood loss and blood transfusion has been ordered  INR is subtherapeutic but increasing after only one dose warfarin  Goal  of Therapy:  INR 2-3   Plan:  1. Warfarin 5mg  PO tonight 2. Daily INR 3. Warfarin education    Gretta Arab PharmD  Pager [phone] 12/22/2011 2:15 PM

## 2011-12-22 NOTE — Progress Notes (Signed)
Subjective: 1 Day Post-Op Procedure(s) (LRB): TOTAL HIP ARTHROPLASTY (Left) Patient reports pain as mild and moderate.   Patient seen in rounds with Dr. Wynelle Link. Patient has complaints of soreness and pain after surgery.  HGB is low and will give blood today. We will start therapy today. Plan is to go home after hospital stay.  Objective: Vital signs in last 24 hours: Temp:  [94 F (34.4 C)-99 F (37.2 C)] 99 F (37.2 C) (02/14 0757) Pulse Rate:  [54-71] 62  (02/14 0757) Resp:  [12-20] 16  (02/14 0757) BP: (90-146)/(55-74) 103/65 mmHg (02/14 0757) SpO2:  [90 %-100 %] 98 % (02/14 0545) Weight:  [65.409 kg (144 lb 3.2 oz)] 65.409 kg (144 lb 3.2 oz) (02/13 1447)  Intake/Output from previous day:  Intake/Output Summary (Last 24 hours) at 12/22/11 0801 Last data filed at 12/22/11 0545  Gross per 24 hour  Intake 3487.5 ml  Output   2360 ml  Net 1127.5 ml    Intake/Output this shift:    Labs:  Basename 12/22/11 0420 12/19/11 1120  HGB 7.1* 9.5*    Basename 12/22/11 0420 12/19/11 1120  WBC 9.2 9.3  RBC 2.24* 3.04*  HCT 21.1* 28.8*  PLT 249 348    Basename 12/22/11 0420 12/19/11 1220  NA 135 136  K 4.7 3.7  CL 106 103  CO2 21 23  BUN 17 16  CREATININE 1.14* 1.45*  GLUCOSE 131* 77  CALCIUM 8.2* 9.6    Basename 12/22/11 0420 12/19/11 1120  LABPT -- --  INR 1.05 0.91    Exam - Neurovascular intact Sensation intact distally Dressing - clean, dry, no drainage Motor function intact - moving foot and toes well on exam.  Hemovac pulled without difficulty.  Past Medical History  Diagnosis Date  . HTN (hypertension)   . HLD (hyperlipidemia)   . Lumbar back pain     with radiculopathy  . Cataract   . Open-angle glaucoma   . PAD (peripheral artery disease)     with intermittent claudication, ABI 0.72 on Rt and 0.73 on L when assessed in 2010.   Marland Kitchen Arthritis     bilateral hips   . Hx of total hip arthroplasty 12/15/2011  . Anemia   . Blood transfusion    11/12     Assessment/Plan: 1 Day Post-Op Procedure(s) (LRB): TOTAL HIP ARTHROPLASTY (Left) Active Problems:  Postop Acute blood loss anemia  Postop Transfusion   Advance diet Up with therapy Continue foley due to blood transfusion; will continue until blood transfusion complete, strict I&O and urinary output monitoring Discharge home with home health  DVT Prophylaxis - Coumadin Protocol Partial-Weight Bearing 25-50% left Leg D/C Knee Immobilizer Hemovac Pulled Begin Therapy Hip Preacutions Keep foley until tomorrow. No vaccines.  Taylor Bush 12/22/2011, 8:01 AM

## 2011-12-22 NOTE — Plan of Care (Signed)
Problem: Phase II Progression Outcomes Goal: Tolerating diet Outcome: Progressing Tolerating clear liquids, advanced to regular diet in am for breakfast.

## 2011-12-22 NOTE — Progress Notes (Signed)
New order to type and screen, transfuse 2 units of PRBCs given by Dr. Jennet Maduro.

## 2011-12-22 NOTE — Progress Notes (Signed)
Beeped ortho MD on call to let them know patient's hgb is 7.1 on am lab draw.

## 2011-12-22 NOTE — Plan of Care (Signed)
Problem: Phase I Progression Outcomes Goal: Pain controlled with appropriate interventions Outcome: Progressing Medicated with pain medicine and muscle relaxor. Patient appears to be resting more comfortably at present.

## 2011-12-22 NOTE — Evaluation (Signed)
Physical Therapy Evaluation Patient Details Name: Taylor Bush MRN: [id number] DOB: Sep 08, 1946 Today's Date: 12/22/2011  Problem List:  Patient Active Problem List  Diagnoses  . HYPERLIPIDEMIA  . UNSPECIFIED OPEN-ANGLE GLAUCOMA  . UNSPECIFIED CATARACT  . HYPERTENSION  . ATHEROSCLEROSIS W /INT CLAUDICATION  . BACK PAIN, LUMBAR, WITH RADICULOPATHY  . AVN (avascular necrosis of bone) Right Hip  . Hx of total hip arthroplasty  . Avascular necrosis of left femoral head  . Anemia, iron deficiency  . Postop Acute blood loss anemia  . Postop Transfusion    Past Medical History:  Past Medical History  Diagnosis Date  . HTN (hypertension)   . HLD (hyperlipidemia)   . Lumbar back pain     with radiculopathy  . Cataract   . Open-angle glaucoma   . PAD (peripheral artery disease)     with intermittent claudication, ABI 0.72 on Rt and 0.73 on L when assessed in 2010.   Marland Kitchen Arthritis     bilateral hips   . Hx of total hip arthroplasty 12/15/2011  . Anemia   . Blood transfusion     11/12    Past Surgical History:  Past Surgical History  Procedure Date  . Cataract surgery 2/10  . Abnormal vascular studies 7/10    ABI's 70, R SFA > 50%^ blockage   . Other surgical history     ganglion cyst removed left wrist   . Total hip arthroplasty 10/03/2011    Procedure: TOTAL HIP ARTHROPLASTY;  Surgeon: Dione Plover Aluisio;  Location: [facility];  Service: Orthopedics;  Laterality: Right;    PT Assessment/Plan/Recommendation PT Assessment Clinical Impression Statement: Patient s/p left THA (had right hip done in Nov,) presents with decreased strength, AROM and pain left LE limiting independence with mobility and she will benefit from skilled PT to maximize independence with mobility and allow d/c home with family assist and HHPT. PT Recommendation/Assessment: Patient will need skilled PT in the acute care venue PT Problem List: Decreased strength;Decreased range of motion;Decreased activity  tolerance;Decreased mobility;Pain;Decreased knowledge of precautions PT Therapy Diagnosis : Acute pain;Difficulty walking PT Plan PT Frequency: 7X/week PT Treatment/Interventions: DME instruction;Gait training;Stair training;Functional mobility training;Therapeutic activities;Therapeutic exercise;Patient/family education PT Recommendation Follow Up Recommendations: Home health PT Equipment Recommended: None recommended by PT PT Goals  Acute Rehab PT Goals PT Goal Formulation: With patient Time For Goal Achievement: 5 days Pt will go Supine/Side to Sit: with supervision PT Goal: Supine/Side to Sit - Progress: Goal set today Pt will go Sit to Supine/Side: with supervision PT Goal: Sit to Supine/Side - Progress: Goal set today Pt will go Sit to Stand: with supervision PT Goal: Sit to Stand - Progress: Goal set today Pt will go Stand to Sit: with supervision PT Goal: Stand to Sit - Progress: Goal set today Pt will Ambulate: 51 - 150 feet;with supervision;with rolling walker PT Goal: Ambulate - Progress: Goal set today Pt will Go Up / Down Stairs: 3-5 stairs;with rail(s);with rolling walker PT Goal: Up/Down Stairs - Progress: Goal set today Pt will Perform Home Exercise Program: with supervision, verbal cues required/provided PT Goal: Perform Home Exercise Program - Progress: Goal set today  PT Evaluation Precautions/Restrictions  Precautions Precautions: Posterior Hip Required Braces or Orthoses: No Restrictions Weight Bearing Restrictions: Yes LLE Weight Bearing: Partial weight bearing LLE Partial Weight Bearing Percentage or Pounds: 25-50% Prior Functioning  Home Living Lives With: Spouse Receives Help From: Family Type of Home: House Home Layout: One level Home Access: Stairs to enter Entrance Stairs-Rails:  Right Entrance Stairs-Number of Steps: 4 Home Adaptive Equipment: Bedside commode/3-in-1;Walker - rolling Prior Function Level of Independence: Independent with basic  ADLs;Requires assistive device for independence;Independent with gait;Independent with transfers Cognition Cognition Arousal/Alertness: Awake/alert Overall Cognitive Status: Appears within functional limits for tasks assessed Orientation Level: Oriented X4 Sensation/Coordination   Extremity Assessment RLE Assessment RLE Assessment: Within Functional Limits LLE Assessment LLE Assessment: Exceptions to WFL LLE AROM (degrees) Overall AROM Left Lower Extremity: Due to pain;Deficits LLE Overall AROM Comments: ankel WFL, otherwise not tested due to pain LLE Strength LLE Overall Strength Comments: performed quad contraction and ankle WFL, otherwise not tested Mobility (including Balance) Bed Mobility Bed Mobility: Yes Supine to Sit: 3: Mod assist Supine to Sit Details (indicate cue type and reason): cues for technique Sitting - Scoot to Edge of Bed: 4: Min assist Sitting - Scoot to [employer] of Bed Details (indicate cue type and reason): for left LE Transfers Transfers: Yes Sit to Stand: 4: Min assist;With upper extremity assist Sit to Stand Details (indicate cue type and reason): cue for technique, precautions Stand to Sit: 4: Min assist;With upper extremity assist;To chair/3-in-1 Stand to Sit Details: cues for technique Ambulation/Gait Ambulation/Gait: Yes Ambulation/Gait Assistance: 4: Min assist Ambulation/Gait Assistance Details (indicate cue type and reason): cues for technique and encouragement to walk out of the room with chair following Ambulation Distance (Feet): 25 Feet Assistive device: Rolling walker Gait Pattern: Antalgic;Step-to pattern    Exercise  Total Joint Exercises Ankle Circles/Pumps: AROM;Left;10 reps;Supine Quad Sets: AROM;Left;10 reps;Supine Heel Slides: AROM;Right;10 reps;Supine End of Session PT - End of Session Equipment Utilized During Treatment: Gait belt Activity Tolerance: Patient limited by pain Patient left: in chair;with family/visitor  present Nurse Communication: Mobility status for transfers General Behavior During Session: [facility] for tasks performed Cognition: [facility] for tasks performed  [facility] 12/22/2011, 11:56 AM

## 2011-12-23 DIAGNOSIS — E871 Hypo-osmolality and hyponatremia: Secondary | ICD-10-CM | POA: Diagnosis not present

## 2011-12-23 LAB — TYPE AND SCREEN
ABO/RH(D): O POS
Antibody Screen: NEGATIVE
Unit division: 0

## 2011-12-23 LAB — CBC
HCT: 26 % — ABNORMAL LOW (ref 36.0–46.0)
MCHC: 33.8 g/dL (ref 30.0–36.0)
MCV: 88.4 fL (ref 78.0–100.0)
Platelets: 216 10*3/uL (ref 150–400)
RDW: 17.3 % — ABNORMAL HIGH (ref 11.5–15.5)
WBC: 17.2 10*3/uL — ABNORMAL HIGH (ref 4.0–10.5)

## 2011-12-23 LAB — BASIC METABOLIC PANEL
BUN: 14 mg/dL (ref 6–23)
Chloride: 103 mEq/L (ref 96–112)
Creatinine, Ser: 1.1 mg/dL (ref 0.50–1.10)
GFR calc Af Amer: 60 mL/min — ABNORMAL LOW (ref >90)
GFR calc non Af Amer: 52 mL/min — ABNORMAL LOW (ref >90)

## 2011-12-23 LAB — PROTIME-INR: Prothrombin Time: 20.2 seconds — ABNORMAL HIGH (ref 11.6–15.2)

## 2011-12-23 MED ORDER — OXYCODONE HCL 5 MG PO TABS: 5.0000 mg | ORAL_TABLET | ORAL | Status: AC | PRN

## 2011-12-23 MED ORDER — METHOCARBAMOL 500 MG PO TABS: 500.0000 mg | ORAL_TABLET | Freq: Four times a day (QID) | ORAL | Status: AC | PRN

## 2011-12-23 MED ORDER — WARFARIN SODIUM 3 MG PO TABS
3.0000 mg | ORAL_TABLET | Freq: Once | ORAL | Status: AC
  Administered 2011-12-23: 3 mg via ORAL
  Filled 2011-12-23: qty 1

## 2011-12-23 MED ORDER — POLYSACCHARIDE IRON 150 MG PO CAPS: 150.0000 mg | ORAL_CAPSULE | Freq: Two times a day (BID) | ORAL | Status: DC

## 2011-12-23 MED ORDER — WARFARIN SODIUM 5 MG PO TABS: 5.0000 mg | ORAL_TABLET | Freq: Every day | ORAL | Status: DC

## 2011-12-23 NOTE — Progress Notes (Signed)
   and confirmation of services received.  Demographics, H&P, OP note, PT notes, Face to Face and orders faxed to [facility] at [phone].  Pt states that her husband and son will be able to assist her at home. CARE MANAGEMENT NOTE 12/23/2011  Patient:  Bush,Taylor   Account Number:  [id number]  Date Initiated:  12/22/2011  Documentation initiated by:  CRAFT,TERRI  Subjective/Objective Assessment:   66 yo female admitted 12/21/11 with osteoarthritis of the left hip S/P left hip replacement     Action/Plan:   D/C when medically stable   Anticipated DC Date:  12/25/2011   Anticipated DC Plan:  [place]  CM consult      [facility] Choice  HOME HEALTH   Choice offered to / List presented to:  C-1 Patient        [place] arranged  [place]      [facility] agency  [facility]   Status of service:  In process, will continue to follow Medicare Important Message given?   (If response is "NO", the following Medicare IM given date fields will be blank) Date Medicare IM given:   Date Additional Medicare IM given:    Discharge Disposition:    Per UR Regulation:    Comments:  12/23/2011 tct [facility] care; start of hh services 12/26/2011/anticipate d/c 12/24/2011. Donald Pore   12/22/11, Aida Raider RNC-MNN, BSN, [phone].  CM received referral for [facility] services.  CM met with pt and offered choice for [facility] services.  Pt states that she has used [facility] in the past and would like to use them again.  CM called [facility] and they will be unable to provide an RN until next Wed., 2/21.  CM spoke with pt. again to inform her and offered choice again for [facility] services.  Pt has chosen [employer] [facility].  CM contacted [facility] with orders

## 2011-12-23 NOTE — Discharge Instructions (Signed)
Pick up stool softner and laxative for home. Do not submerge incision under water. May shower. Continue to use ice for pain and swelling from surgery. Hip precautions.  Total Hip Protocol.

## 2011-12-23 NOTE — Progress Notes (Signed)
ANTICOAGULATION CONSULT NOTE - Follow up [place] for Warfarin Indication: VTE prophylaxis s/p L THA  Allergies  Allergen Reactions  . Statins     Patient Measurements: Height: 5\' 3"  (160 cm) Weight: 144 lb 3.2 oz (65.409 kg) IBW/kg (Calculated) : 52.4   Vital Signs: Temp: 98.8 F (37.1 C) (02/15 0510) Temp src: Oral (02/15 0510) BP: 102/62 mmHg (02/15 0510) Pulse Rate: 74  (02/15 0510)  Labs:  Basename 12/23/11 0343 12/22/11 0420  HGB 8.8* 7.1*  HCT 26.0* 21.1*  PLT 216 249  APTT -- --  LABPROT 20.2* 13.9  INR 1.69* 1.05  HEPARINUNFRC -- --  CREATININE 1.10 1.14*  CKTOTAL -- --  CKMB -- --  TROPONINI -- --   Estimated Creatinine Clearance: 46.4 ml/min (by C-G formula based on Cr of 1.1).  Medical History: Past Medical History  Diagnosis Date  . HTN (hypertension)   . HLD (hyperlipidemia)   . Lumbar back pain     with radiculopathy  . Cataract   . Open-angle glaucoma   . PAD (peripheral artery disease)     with intermittent claudication, ABI 0.72 on Rt and 0.73 on L when assessed in 2010.   Marland Kitchen Arthritis     bilateral hips   . Hx of total hip arthroplasty 12/15/2011  . Anemia   . Blood transfusion     11/12     Medications:  Scheduled:     . acetaminophen  1,000 mg Intravenous Q6H  . amLODipine  10 mg Oral QAC breakfast  . docusate sodium  100 mg Oral BID  . metoprolol  100 mg Oral BID  . polysaccharide iron  150 mg Oral BID  . warfarin  5 mg Oral ONCE-1800  . warfarin   Does not apply Once    [name] on 2/13 with warfarin ordered post op for VTE prophylaxis.   POD #2, HGB is improved, no reports of bleeding or complications  INR is subtherapeutic but increasing   Goal of Therapy:  INR 2-3   Plan:  1. Warfarin 3mg  PO tonight 2. Daily INR 3. Warfarin education    Taylor Bush PharmD  Pager [phone] 12/23/2011 10:30 AM

## 2011-12-23 NOTE — Progress Notes (Signed)
Physical Therapy Treatment Patient Details Name: Taylor Bush MRN: [id number] DOB: [birth year] Today's Date: 12/23/2011  L THA POD #2  9:20 - 9:45 1 ta  1 gt  PT Assessment/Plan  PT - Assessment/Plan Comments on Treatment Session: Pt progressing slowly and plans to D/C to home Saturday.  Pt has had her R THR lat November and is very knowledgable. PT Plan: Discharge plan remains appropriate Follow Up Recommendations: Home health PT Equipment Recommended: None recommended by PT PT Goals  Acute Rehab PT Goals PT Goal Formulation: With patient Pt will go Supine/Side to Sit: with supervision PT Goal: Supine/Side to Sit - Progress: Progressing toward goal Pt will go Sit to Supine/Side: with supervision PT Goal: Sit to Supine/Side - Progress: Progressing toward goal Pt will go Sit to Stand: with supervision PT Goal: Sit to Stand - Progress: Progressing toward goal Pt will go Stand to Sit: with supervision PT Goal: Stand to Sit - Progress: Progressing toward goal Pt will Ambulate: 51 - 150 feet;with supervision;with rolling walker PT Goal: Ambulate - Progress: Progressing toward goal Pt will Go Up / Down Stairs: 3-5 stairs;with supervision;with rolling walker PT Goal: Up/Down Stairs - Progress: Not met Pt will Perform Home Exercise Program: with supervision, verbal cues required/provided PT Goal: Perform Home Exercise Program - Progress: Not met  PT Treatment Precautions/Restrictions  Precautions Precautions: Posterior Hip Required Braces or Orthoses: No Restrictions Weight Bearing Restrictions: Yes LLE Weight Bearing: Partial weight bearing LLE Partial Weight Bearing Percentage or Pounds: 25-50% Mobility (including Balance) Bed Mobility Bed Mobility: Yes Supine to Sit: 4: Min assist Supine to Sit Details (indicate cue type and reason): mod assist to support L LE off bed Sitting - Scoot to Edge of Bed: 3: Mod assist Sitting - Scoot to [employer] of Bed Details (indicate cue type and  reason): using pad under pt to assist and support L LE Transfers Transfers: Yes Sit to Stand: 3: Mod assist;From bed Sit to Stand Details (indicate cue type and reason): increased time and one VC to extend L LE prior to stand Stand to Sit: 4: Min assist;To chair/3-in-1 Stand to Sit Details: increased time and one VC on hand placement Ambulation/Gait Ambulation/Gait: Yes Ambulation/Gait Assistance: 4: Min assist Ambulation/Gait Assistance Details (indicate cue type and reason): increased time and 75% VC's to increase L heel strike and get foot flat, Pt ststes "I have walked like this since R THR, it won't do it". Ambulation Distance (Feet): 55 Feet Assistive device: Rolling walker Gait Pattern: Step-to pattern;Decreased stance time - left;Trunk flexed Stairs: No Wheelchair Mobility Wheelchair Mobility: No    Exercise    End of Session PT - End of Session Equipment Utilized During Treatment: Gait belt Activity Tolerance: Patient limited by fatigue Patient left: in chair;with call bell in reach General Behavior During Session: [facility] for tasks performed Cognition: [facility] for tasks performed  Rica Koyanagi  PTA [facility]  Acute  Rehab Pager     [phone]

## 2011-12-23 NOTE — Progress Notes (Signed)
OT Note:  Pt had other hip replaced in 11/12.  No acute OT needs.  Screen.  [place], OTR/L [id number] 12/23/2011

## 2011-12-23 NOTE — Progress Notes (Signed)
Subjective: 2 Days Post-Op Procedure(s) (LRB): TOTAL HIP ARTHROPLASTY (Left) Patient reports pain as mild.   Patient seen in rounds with Dr. Wynelle Link. Patient doing OK.  Work with therapy today maybe home tomorrow.  Objective: Vital signs in last 24 hours: Temp:  [97.9 F (36.6 C)-100 F (37.8 C)] 98.8 F (37.1 C) (02/15 0510) Pulse Rate:  [61-74] 74  (02/15 0510) Resp:  [14-18] 16  (02/15 0510) BP: (95-125)/(58-76) 102/62 mmHg (02/15 0510) SpO2:  [93 %-95 %] 94 % (02/15 0510)  Intake/Output from previous day:  Intake/Output Summary (Last 24 hours) at 12/23/11 0859 Last data filed at 12/23/11 0514  Gross per 24 hour  Intake 2150.25 ml  Output   1300 ml  Net 850.25 ml    Intake/Output this shift:    Labs:  Basename 12/23/11 0343 12/22/11 0420  HGB 8.8* 7.1*    Basename 12/23/11 0343 12/22/11 0420  WBC 17.2* 9.2  RBC 2.94* 2.24*  HCT 26.0* 21.1*  PLT 216 249    Basename 12/23/11 0343 12/22/11 0420  NA 131* 135  K 4.2 4.7  CL 103 106  CO2 21 21  BUN 14 17  CREATININE 1.10 1.14*  GLUCOSE 127* 131*  CALCIUM 8.6 8.2*    Basename 12/23/11 0343 12/22/11 0420  LABPT -- --  INR 1.69* 1.05    Exam - Neurovascular intact Sensation intact distally Dressing/Incision - clean, dry, no drainage Motor function intact - moving foot and toes well on exam.   Past Medical History  Diagnosis Date  . HTN (hypertension)   . HLD (hyperlipidemia)   . Lumbar back pain     with radiculopathy  . Cataract   . Open-angle glaucoma   . PAD (peripheral artery disease)     with intermittent claudication, ABI 0.72 on Rt and 0.73 on L when assessed in 2010.   Marland Kitchen Arthritis     bilateral hips   . Hx of total hip arthroplasty 12/15/2011  . Anemia   . Blood transfusion     11/12     Assessment/Plan: 2 Days Post-Op Procedure(s) (LRB): TOTAL HIP ARTHROPLASTY (Left) Active Problems:  Postop Acute blood loss anemia  Postop Transfusion  Postop Hyponatremia   Advance diet Up  with therapy Plan for discharge tomorrow Discharge home with home health  DVT Prophylaxis - Coumadin Protocol Partial-Weight Bearing 25-50% left Leg  Shylyn Younce 12/23/2011, 8:59 AM

## 2011-12-23 NOTE — Progress Notes (Signed)
Physical Therapy Treatment Patient Details Name: Taylor Bush MRN: [id number] DOB: October 27, 1946 Today's Date: 12/23/2011  L THA POD #2 pm session R THA 10/03/11 15:00 - 15:20 1 gt  PT Assessment/Plan  PT - Assessment/Plan Comments on Treatment Session: Pt took a good nap.  Assisted pt OOB to amb to bathroom then back to bed 2nd c/o 6/10 hip pain, nurse notified. Pt plans to D/C to home Saturday. PT Plan: Discharge plan remains appropriate Follow Up Recommendations: Home health PT Equipment Recommended: None recommended by PT PT Goals  Acute Rehab PT Goals PT Goal Formulation: With patient Pt will go Supine/Side to Sit: with supervision PT Goal: Supine/Side to Sit - Progress: Progressing toward goal Pt will go Sit to Supine/Side: with supervision PT Goal: Sit to Supine/Side - Progress: Progressing toward goal Pt will go Sit to Stand: with supervision PT Goal: Sit to Stand - Progress: Progressing toward goal Pt will go Stand to Sit: with supervision PT Goal: Stand to Sit - Progress: Progressing toward goal Pt will Ambulate: 51 - 150 feet;with supervision;with rolling walker PT Goal: Ambulate - Progress: Progressing toward goal Pt will Go Up / Down Stairs: 3-5 stairs;with supervision;with rolling walker PT Goal: Up/Down Stairs - Progress: Not met Pt will Perform Home Exercise Program: with supervision, verbal cues required/provided PT Goal: Perform Home Exercise Program - Progress: Not met  PT Treatment Precautions/Restrictions  Precautions Precautions: Posterior Hip Precaution Comments: Pt recalls 3/3 THP Required Braces or Orthoses: No Restrictions Weight Bearing Restrictions: Yes LLE Weight Bearing: Partial weight bearing LLE Partial Weight Bearing Percentage or Pounds: Pt aware she is PWB Mobility (including Balance) Bed Mobility Bed Mobility: Yes Supine to Sit: 4: Min assist Supine to Sit Details (indicate cue type and reason): increased time Sitting - Scoot to Edge of  Bed: 4: Min assist Sitting - Scoot to [employer] of Bed Details (indicate cue type and reason): increased time Sit to Supine: 4: Min assist Sit to Supine - Details (indicate cue type and reason): increased time Transfers Transfers: Yes Sit to Stand: 4: Min assist;From bed;From toilet Sit to Stand Details (indicate cue type and reason): increased time and 25% VC's on proper handplacement and turn completion Stand to Sit: 4: Min assist;To toilet;To bed Stand to Sit Details: increased time and 25% VC's to avoid hip flex > 90' Ambulation/Gait Ambulation/Gait: Yes Ambulation/Gait Assistance: 4: Min assist Ambulation/Gait Assistance Details (indicate cue type and reason): only amb to and from bathroom 2nd pain level, nurse notified Ambulation Distance (Feet): 25 Feet Assistive device: Rolling walker Gait Pattern: Step-to pattern;Decreased stance time - left Gait velocity: pt c/o 6/10 with activity Stairs: No Wheelchair Mobility Wheelchair Mobility: No    Exercise    End of Session PT - End of Session Equipment Utilized During Treatment: Gait belt Activity Tolerance: Patient tolerated treatment well Patient left: in bed;with call bell in reach General Behavior During Session: [facility] for tasks performed Cognition: [facility] for tasks performed  Taylor Bush  PTA [facility]  Acute  Rehab Pager     [phone]

## 2011-12-24 LAB — BASIC METABOLIC PANEL
BUN: 13 mg/dL (ref 6–23)
Chloride: 101 mEq/L (ref 96–112)
Glucose, Bld: 104 mg/dL — ABNORMAL HIGH (ref 70–99)
Potassium: 4 mEq/L (ref 3.5–5.1)

## 2011-12-24 LAB — CBC
Hemoglobin: 8.4 g/dL — ABNORMAL LOW (ref 12.0–15.0)
MCH: 30.3 pg (ref 26.0–34.0)
MCHC: 34.1 g/dL (ref 30.0–36.0)
MCV: 88.8 fL (ref 78.0–100.0)
RBC: 2.77 MIL/uL — ABNORMAL LOW (ref 3.87–5.11)

## 2011-12-24 NOTE — Progress Notes (Signed)
CM spoke with [facility] rep Kathyrn. Patient scheduled start of care for HHPT\RN is 2/18. HHRN is Manuela Schwartz. Md orders, d/c summary faxed to [facility] at [phone].  Arlean Hopping [phone]

## 2011-12-24 NOTE — Progress Notes (Signed)
Physical Therapy Treatment Patient Details Name: Taylor Bush MRN: [id number] DOB: [birth year] Today's Date: 12/24/2011  R THR POD #3 11:55 - 12:15 1 hm  PT Assessment/Plan  PT - Assessment/Plan Comments on Treatment Session: Family present and assisted helping pt up/down four steps using one rail and one crutch...Marland KitchenMarland KitchenPt plans to D/C to home today. PT Plan: Discharge plan remains appropriate Follow Up Recommendations: Home health PT Equipment Recommended: None recommended by PT;Other (comment) (has all equipment from prior THR) PT Goals  Acute Rehab PT Goals PT Goal Formulation: With patient Pt will go Supine/Side to Sit: with supervision PT Goal: Supine/Side to Sit - Progress: Progressing toward goal Pt will go Sit to Supine/Side: with supervision PT Goal: Sit to Supine/Side - Progress: Progressing toward goal Pt will go Sit to Stand: with supervision PT Goal: Sit to Stand - Progress: Met Pt will go Stand to Sit: with supervision PT Goal: Stand to Sit - Progress: Met Pt will Ambulate: 51 - 150 feet;with supervision;with rolling walker PT Goal: Ambulate - Progress: Partly met Pt will Go Up / Down Stairs: 3-5 stairs;with supervision;with least restrictive assistive device PT Goal: Up/Down Stairs - Progress: Partly met Pt will Perform Home Exercise Program: with supervision, verbal cues required/provided PT Goal: Perform Home Exercise Program - Progress: Met  PT Treatment Precautions/Restrictions  Precautions Precautions: Posterior Hip Precaution Comments: Pt recalls 3/3 THP Required Braces or Orthoses: No Restrictions Weight Bearing Restrictions: Yes LLE Weight Bearing: Partial weight bearing LLE Partial Weight Bearing Percentage or Pounds: Pt aware she is PWB Mobility (including Balance) Bed Mobility Bed Mobility: No (pt OOB in recliner) Transfers Transfers: Yes Sit to Stand: 5: Supervision;From chair/3-in-1 Sit to Stand Details (indicate cue type and reason): increased  time Stand to Sit: 5: Supervision Stand to Sit Details: increased time Ambulation/Gait Ambulation/Gait: Yes Ambulation/Gait Assistance: Other (comment) (minguard assist) Ambulation/Gait Assistance Details (indicate cue type and reason): to and from stairs only, TX session focus on negociating steps Ambulation Distance (Feet): 10 Feet Assistive device: Rolling walker Gait Pattern: Step-to pattern;Decreased stance time - right;Trunk flexed Gait velocity: PWB Stairs: Yes Stairs Assistance: 4: Min assist Stairs Assistance Details (indicate cue type and reason): 25% VC's on proper technique and instruction to family Stair Management Technique: One rail Right;With crutches;Forwards Number of Stairs: 4  Wheelchair Mobility Wheelchair Mobility: No    Exercise    End of Session PT - End of Session Equipment Utilized During Treatment: Gait belt Activity Tolerance: Patient tolerated treatment well Patient left: in chair;with call bell in reach;with family/visitor present Nurse Communication: Other (comment) (Pt ready for D/C to home) General Behavior During Session: [facility] for tasks performed Cognition: [facility] for tasks performed  Rica Koyanagi  PTA [facility]  Acute  Rehab Pager     [phone]

## 2011-12-24 NOTE — Progress Notes (Signed)
Subjective: 3 Days Post-Op Procedure(s) (LRB): TOTAL HIP ARTHROPLASTY (Left) Patient reports pain as mild.   Patient seen in rounds with Dr. Gladstone Lighter. Patient has complaints of mild pain. She states that her pain is well-controlled. She denies shortness of breath and chest pain. She states that she has been up with therapy but has not worked on stairs yet and would like to do so before discharge.   Objective: Vital signs in last 24 hours: Temp:  [98.3 F (36.8 C)-98.7 F (37.1 C)] 98.3 F (36.8 C) (02/16 0610) Pulse Rate:  [66-71] 69  (02/16 0610) Resp:  [16] 16  (02/16 0610) BP: (97-117)/(57-68) 117/63 mmHg (02/16 0805) SpO2:  [93 %-96 %] 96 % (02/16 0610)  Intake/Output from previous day:  Intake/Output Summary (Last 24 hours) at 12/24/11 0820 Last data filed at 12/24/11 0745  Gross per 24 hour  Intake    720 ml  Output    850 ml  Net   -130 ml    Intake/Output this shift: Total I/O In: -  Out: 150 [Urine:150]  Labs:  [facility] 12/24/11 0415 12/23/11 0343 12/22/11 0420  HGB 8.4* 8.8* 7.1*    Basename 12/24/11 0415 12/23/11 0343  WBC 15.9* 17.2*  RBC 2.77* 2.94*  HCT 24.6* 26.0*  PLT 222 216    Basename 12/24/11 0415 12/23/11 0343  NA 130* 131*  K 4.0 4.2  CL 101 103  CO2 22 21  BUN 13 14  CREATININE 1.01 1.10  GLUCOSE 104* 127*  CALCIUM 9.1 8.6    Basename 12/24/11 0415 12/23/11 0343  LABPT -- --  INR 2.77* 1.69*    Exam - Neurologically intact Neurovascular intact Dorsiflexion/Plantar flexion intact Dressing/Incision - clean, dry, no drainage Motor function intact - moving foot and toes well on exam.   Past Medical History  Diagnosis Date  . HTN (hypertension)   . HLD (hyperlipidemia)   . Lumbar back pain     with radiculopathy  . Cataract   . Open-angle glaucoma   . PAD (peripheral artery disease)     with intermittent claudication, ABI 0.72 on Rt and 0.73 on L when assessed in 2010.   Marland Kitchen Arthritis     bilateral hips   . Hx of total hip  arthroplasty 12/15/2011  . Anemia   . Blood transfusion     11/12     Assessment/Plan: 3 Days Post-Op Procedure(s) (LRB): TOTAL HIP ARTHROPLASTY (Left) Active Problems:  Postop Acute blood loss anemia  Postop Transfusion  Postop Hyponatremia   Advance diet Up with therapy Discharge home with home health after therapy today. PT should work on stairs with patient  DVT Prophylaxis - Xarelto Protocol Partial-Weight Bearing 25-50% left leg  Jonique Kulig LAUREN 12/24/2011, 8:20 AM

## 2011-12-24 NOTE — Progress Notes (Signed)
Discharged from floor via w/c, family with pt. No changes in assessment. Taylor Bush  

## 2012-01-09 ENCOUNTER — Encounter (HOSPITAL_COMMUNITY): Payer: Self-pay | Admitting: Orthopedic Surgery

## 2012-01-09 NOTE — Discharge Summary (Signed)
Physician Discharge Summary   Patient ID: Taylor Bush MRN: [id number] DOB/AGE: Apr 03, 1946 66 y.o.  Admit date: 12/21/2011 Discharge date: 12/24/2011  Primary Diagnosis: AVN (avascular necrosis of bone) left hip  Admission Diagnoses: Past Medical History  Diagnosis Date  . HTN (hypertension)   . HLD (hyperlipidemia)   . Lumbar back pain     with radiculopathy  . Cataract   . Open-angle glaucoma   . PAD (peripheral artery disease)     with intermittent claudication, ABI 0.72 on Rt and 0.73 on L when assessed in 2010.   Marland Kitchen Arthritis     bilateral hips   . Hx of total hip arthroplasty 12/15/2011  . Anemia   . Blood transfusion     11/12    Discharge Diagnoses:  Active Problems:  Postop Acute blood loss anemia  Postop Transfusion  Postop Hyponatremia  Procedure: Procedure(s) (LRB): TOTAL HIP ARTHROPLASTY (Left)   Consults: None  HPI: Taylor Bush is a 66 y.o. female with end stage AVN of her left hip with progressively worsening pain and dysfunction. Pain occurs with activity and rest including pain at night. She has tried analgesics, protected weight bearing and rest without benefit. Pain is too severe to attempt physical therapy. Radiographs demonstrate bone on bone arthritis with severe collapse of the femoral head. She presents now for left THA.  Laboratory Data: Hospital Outpatient Visit on 12/19/2011  Component Date Value Range Status  . WBC (K/uL) 12/19/2011 9.3  4.0-10.5 Final  . RBC (MIL/uL) 12/19/2011 3.04* 3.87-5.11 Final  . Hemoglobin (g/dL) 12/19/2011 9.5* 12.0-15.0 Final  . HCT (%) 12/19/2011 28.8* 36.0-46.0 Final  . MCV (fL) 12/19/2011 94.7  78.0-100.0 Final  . MCH (pg) 12/19/2011 31.3  26.0-34.0 Final  . MCHC (g/dL) 12/19/2011 33.0  30.0-36.0 Final  . RDW (%) 12/19/2011 15.5  11.5-15.5 Final  . Platelets (K/uL) 12/19/2011 348  150-400 Final  . Sodium (mEq/L) 12/19/2011 136  135-145 Final  . Potassium (mEq/L) 12/19/2011 3.7  3.5-5.1 Final  . Chloride  (mEq/L) 12/19/2011 103  96-112 Final  . CO2 (mEq/L) 12/19/2011 23  19-32 Final  . Glucose, Bld (mg/dL) 12/19/2011 77  70-99 Final  . BUN (mg/dL) 12/19/2011 16  6-23 Final  . Creatinine, Ser (mg/dL) 12/19/2011 1.45* 0.50-1.10 Final  . Calcium (mg/dL) 12/19/2011 9.6  8.4-10.5 Final  . Total Protein (g/dL) 12/19/2011 7.5  6.0-8.3 Final  . Albumin (g/dL) 12/19/2011 3.4* 3.5-5.2 Final  . AST (U/L) 12/19/2011 10  0-37 Final  . ALT (U/L) 12/19/2011 5  0-35 Final  . Alkaline Phosphatase (U/L) 12/19/2011 70  39-117 Final  . Total Bilirubin (mg/dL) 12/19/2011 0.2* 0.3-1.2 Final  . GFR calc non Af Amer (mL/min) 12/19/2011 37* >90 Final  . GFR calc Af Amer (mL/min) 12/19/2011 43* >90 Final   Comment:                                 The eGFR has been calculated                          using the CKD EPI equation.                          This calculation has not been  validated in all clinical                          situations.                          eGFR's persistently                          <90 mL/min signify                          possible Chronic Kidney Disease.  Marland Kitchen Prothrombin Time (seconds) 12/19/2011 12.5  11.6-15.2 Final  . INR  12/19/2011 0.91  0.00-1.49 Final  . aPTT (seconds) 12/19/2011 32  24-37 Final  . Color, Urine  12/19/2011 YELLOW  YELLOW Final  . APPearance  12/19/2011 CLEAR  CLEAR Final  . Specific Gravity, Urine  12/19/2011 1.020  1.005-1.030 Final  . pH  12/19/2011 5.0  5.0-8.0 Final  . Glucose, UA (mg/dL) 12/19/2011 NEGATIVE  NEGATIVE Final  . Hgb urine dipstick  12/19/2011 NEGATIVE  NEGATIVE Final  . Bilirubin Urine  12/19/2011 NEGATIVE  NEGATIVE Final  . Ketones, ur (mg/dL) 12/19/2011 NEGATIVE  NEGATIVE Final  . Protein, ur (mg/dL) 12/19/2011 NEGATIVE  NEGATIVE Final  . Urobilinogen, UA (mg/dL) 12/19/2011 0.2  0.0-1.0 Final  . Nitrite  12/19/2011 NEGATIVE  NEGATIVE Final  . Leukocytes, UA  12/19/2011 SMALL* NEGATIVE Final  . Squamous  Epithelial / LPF  12/19/2011 FEW* RARE Final  . WBC, UA (WBC/hpf) 12/19/2011 3-6  <3 Final  . Bacteria, UA  12/19/2011 FEW* RARE Final  . Casts  12/19/2011 HYALINE CASTS* NEGATIVE Final   No results found for this basename: HGB:5 in the last 72 hours No results found for this basename: WBC:2,RBC:2,HCT:2,PLT:2 in the last 72 hours No results found for this basename: NA:2,K:2,CL:2,CO2:2,BUN:2,CREATININE:2,GLUCOSE:2,CALCIUM:2 in the last 72 hours No results found for this basename: LABPT:2,INR:2 in the last 72 hours  X-Rays:X-ray Hip Left Ap And Lateral  12/19/2011  *RADIOLOGY REPORT*  Clinical Data: Left hip pain, preop.  LEFT HIP - COMPLETE 2+ VIEW  Comparison: 08/01/2011.  Findings: There has been progressive collapse of the left femoral head, with complete loss of joint space and superior subluxation of the left femur.  Right hip arthroplasty.  IMPRESSION: Progressive avascular necrosis of the left femoral head, as above.  Original Report Authenticated By: Luretha Rued, M.D.   Dg Pelvis Portable  12/21/2011  *RADIOLOGY REPORT*  Clinical Data: Status post left hip replacement.  PORTABLE PELVIS  Comparison: Plain films right hip 10/03/2011.  Findings: The patient has a new left total hip replacement.  The device is located and there is no fracture.  Surgical drain and gas in the soft tissues are noted.  Right total hip replacement also again seen.  IMPRESSION: New left total hip without evidence of complication.  Original Report Authenticated By: Arvid Right. D'ALESSIO, M.D.    EKG: Orders placed in visit on 09/05/11  . EKG 12-LEAD    Hospital Course: Patient was admitted to [facility] and taken to the OR and underwent the above state procedure without complications.  Patient tolerated the procedure well and was later transferred to the recovery room and then to the orthopaedic floor for postoperative care.  They were given PO and IV analgesics for pain control following their  surgery.  They were given 24 hours of  postoperative antibiotics and started on DVT prophylaxis.   PT and OT were ordered for total joint protocol.  Discharge planning consulted to help with postop disposition and equipment needs.  Patient had a tough night on the evening of surgery and started to get up with therapy on day one. HGB was noted to be low so they received a transfusion.  Hemovac drain was pulled without difficulty. Patient did better after the blood walking about 50 feet.  Continued to progress with therapy into day two.  Dressing was changed on day two and the incision was healing well.  By day three, the patient had progressed with therapy and meeting goals.  Incision was healing well.  Patient was seen in rounds and was ready to go home.  Discharge Medications: Prior to Admission medications   Medication Sig Start Date End Date Taking? Authorizing Provider  amLODipine (NORVASC) 10 MG tablet Take 10 mg by mouth daily before breakfast. 07/19/11 07/18/12 Yes Spencer Copland, MD  diphenhydrAMINE (BENADRYL) 25 mg capsule Take 25 mg by mouth daily as needed. Allergies    Yes Historical Provider, MD  docusate sodium (COLACE) 50 MG capsule Take 100 mg by mouth as needed. Pt takes [facility] Colace 100mg  as needed for constipation   Yes Historical Provider, MD  lisinopril-hydrochlorothiazide (PRINZIDE,ZESTORETIC) 20-25 MG per tablet Take 1 tablet by mouth every morning.  07/19/11 07/18/12 Yes Spencer Copland, MD  metoprolol (LOPRESSOR) 100 MG tablet Take 1 tablet (100 mg total) by mouth 2 (two) times daily. 08/01/11 07/31/12 Yes Spencer Copland, MD  polysaccharide iron (NIFEREX) 150 MG CAPS capsule Take 1 capsule (150 mg total) by mouth 2 (two) times daily. 12/23/11   Calin Fantroy Dara Lords, PA  warfarin (COUMADIN) 5 MG tablet Take 1 tablet (5 mg total) by mouth daily. Take as directed.  Dose may need to be adjusted depending upon the INR level. 12/23/11 12/22/12  Shourya Macpherson Dara Lords, PA    Diet: heart  healthy  Activity:PWB No bending hip over 90 degrees- A "L" Angle Do not cross legs Do not let foot roll inward  When turning these patients a pillow should be placed between the patient's legs to prevent crossing.  Patients should have the affected knee fully extended when trying to sit or stand from all surfaces to prevent excessive hip flexion.  When ambulating and turning toward the affected side the affected leg should have the toes turned out prior to moving the walker and the rest of patient's body as to prevent internal rotation/ turning in of the leg.  Abduction pillows are the most effective way to prevent a patient from not crossing legs or turning toes in at rest. If an abduction pillow is not ordered placing a regular pillow length wise between the patient's legs is also an effective reminder.  It is imperative that these precautions be maintained so that the surgical hip does not dislocate.    Follow-up:in 2 weeks  Disposition: home  Discharged Condition: good   Discharge Orders    Future Orders Please Complete By Expires   Diet - low sodium heart healthy      Diet - low sodium heart healthy      Call MD / Call 911      Comments:   If you experience chest pain or shortness of breath, CALL 911 and be transported to the hospital emergency room.  If you develope a fever above 101 F, pus (white drainage) or increased drainage or redness at the wound, or calf pain,  call your surgeon's office.   Constipation Prevention      Comments:   Drink plenty of fluids.  Prune juice may be helpful.  You may use a stool softener, such as Colace (over the counter) 100 mg twice a day.  Use MiraLax (over the counter) for constipation as needed.   Increase activity slowly as tolerated      Weight Bearing as taught in Physical Therapy      Comments:   Use a walker or crutches as instructed.   Discharge instructions      Comments:   Pick up stool softner and laxative for home. Do not  submerge incision under water. May shower. Continue to use ice for pain and swelling from surgery. Hip precautions.  Total Hip Protocol.   Driving restrictions      Comments:   No driving   Lifting restrictions      Comments:   No lifting   Follow the hip precautions as taught in Physical Therapy      Change dressing      Comments:   You may change your dressing daily with sterile 4 x 4 inch gauze dressing and paper tape.   TED hose      Comments:   Use stockings (TED hose) for 3 weeks on both leg(s).  You may remove them at night for sleeping.   Call MD / Call 911      Comments:   If you experience chest pain or shortness of breath, CALL 911 and be transported to the hospital emergency room.  If you develope a fever above 101 F, pus (white drainage) or increased drainage or redness at the wound, or calf pain, call your surgeon's office.   Constipation Prevention      Comments:   Drink plenty of fluids.  Prune juice may be helpful.  You may use a stool softener, such as Colace (over the counter) 100 mg twice a day.  Use MiraLax (over the counter) for constipation as needed.   Increase activity slowly as tolerated      Weight Bearing as taught in Physical Therapy      Comments:   Use a walker or crutches as instructed.     Medication List  As of 01/09/2012  7:33 AM   STOP taking these medications         HYDROcodone-acetaminophen 5-325 MG per tablet         TAKE these medications         amLODipine 10 MG tablet   Commonly known as: NORVASC   Take 10 mg by mouth daily before breakfast.      diphenhydrAMINE 25 mg capsule   Commonly known as: BENADRYL   Take 25 mg by mouth daily as needed. Allergies        docusate sodium 50 MG capsule   Commonly known as: COLACE   Take 100 mg by mouth as needed. Pt takes [facility] Colace 100mg  as needed for constipation      lisinopril-hydrochlorothiazide 20-25 MG per tablet   Commonly known as: PRINZIDE,ZESTORETIC   Take 1 tablet by mouth  every morning.      metoprolol 100 MG tablet   Commonly known as: LOPRESSOR   Take 1 tablet (100 mg total) by mouth 2 (two) times daily.      polysaccharide iron 150 MG Caps capsule   Commonly known as: NIFEREX   Take 1 capsule (150 mg total) by mouth 2 (two) times daily.  warfarin 5 MG tablet   Commonly known as: COUMADIN   Take 1 tablet (5 mg total) by mouth daily. Take as directed.  Dose may need to be adjusted depending upon the INR level.           Follow-up Information    Follow up with Gearlean Alf, MD. Schedule an appointment as soon as possible for a visit in 2 weeks.   Contact information:   [facility] [address] [place] [id number]          Signed: Mickel Crow 01/09/2012, 7:33 AM

## 2012-01-17 ENCOUNTER — Encounter: Payer: Self-pay | Admitting: Internal Medicine

## 2012-01-26 ENCOUNTER — Other Ambulatory Visit: Payer: Medicare Other | Admitting: Internal Medicine

## 2012-03-09 ENCOUNTER — Encounter: Payer: Self-pay | Admitting: Family Medicine

## 2012-03-30 ENCOUNTER — Ambulatory Visit (AMBULATORY_SURGERY_CENTER): Payer: Medicare Other | Admitting: *Deleted

## 2012-03-30 ENCOUNTER — Encounter: Payer: Self-pay | Admitting: Internal Medicine

## 2012-03-30 VITALS — Ht 62.0 in | Wt 142.7 lb

## 2012-03-30 DIAGNOSIS — Z1211 Encounter for screening for malignant neoplasm of colon: Secondary | ICD-10-CM

## 2012-03-30 MED ORDER — PEG-KCL-NACL-NASULF-NA ASC-C 100 G PO SOLR: ORAL | Status: DC

## 2012-03-30 NOTE — Progress Notes (Signed)
Pt told to hold her Niferex tablet starting on 04-08-12 and understanding voiced.  She was on Coumadin in February due to her hip surgery; she has been off since the beginning of March  No allergy to eggs or soy products

## 2012-04-13 ENCOUNTER — Encounter: Payer: Self-pay | Admitting: Internal Medicine

## 2012-04-13 ENCOUNTER — Ambulatory Visit (AMBULATORY_SURGERY_CENTER): Payer: Medicare Other | Admitting: Internal Medicine

## 2012-04-13 VITALS — BP 121/63 | HR 63 | Temp 97.5°F | Resp 16 | Ht 62.0 in | Wt 142.0 lb

## 2012-04-13 DIAGNOSIS — D126 Benign neoplasm of colon, unspecified: Secondary | ICD-10-CM

## 2012-04-13 DIAGNOSIS — Z1211 Encounter for screening for malignant neoplasm of colon: Secondary | ICD-10-CM

## 2012-04-13 MED ORDER — SODIUM CHLORIDE 0.9 % IV SOLN: 500.0000 mL | INTRAVENOUS | Status: DC

## 2012-04-13 NOTE — Patient Instructions (Signed)
YOU HAD AN ENDOSCOPIC PROCEDURE TODAY AT THE [facility]: Refer to the procedure report that was given to you for any specific questions about what was found during the examination.  If the procedure report does not answer your questions, please call your gastroenterologist to clarify.  If you requested that your care partner not be given the details of your procedure findings, then the procedure report has been included in a sealed envelope for you to review at your convenience later.  YOU SHOULD EXPECT: Some feelings of bloating in the abdomen. Passage of more gas than usual.  Walking can help get rid of the air that was put into your GI tract during the procedure and reduce the bloating. If you had a lower endoscopy (such as a colonoscopy or flexible sigmoidoscopy) you may notice spotting of blood in your stool or on the toilet paper. If you underwent a bowel prep for your procedure, then you may not have a normal bowel movement for a few days.  DIET: Your first meal following the procedure should be a light meal and then it is ok to progress to your normal diet.  A half-sandwich or bowl of soup is an example of a good first meal.  Heavy or fried foods are harder to digest and may make you feel nauseous or bloated.  Likewise meals heavy in dairy and vegetables can cause extra gas to form and this can also increase the bloating.  Drink plenty of fluids but you should avoid alcoholic beverages for 24 hours.  ACTIVITY: Your care partner should take you home directly after the procedure.  You should plan to take it easy, moving slowly for the rest of the day.  You can resume normal activity the day after the procedure however you should NOT DRIVE or use heavy machinery for 24 hours (because of the sedation medicines used during the test).    SYMPTOMS TO REPORT IMMEDIATELY: A gastroenterologist can be reached at any hour.  During normal business hours, 8:30 AM to 5:00 PM Monday through Friday,  call [phone].  After hours and on weekends, please call the GI answering service at [phone] who will take a message and have the physician on call contact you.   Following lower endoscopy (colonoscopy or flexible sigmoidoscopy):  Excessive amounts of blood in the stool  Significant tenderness or worsening of abdominal pains  Swelling of the abdomen that is new, acute  Fever of 100F or higher    FOLLOW UP: If any biopsies were taken you will be contacted by phone or by letter within the next 1-3 weeks.  Call your gastroenterologist if you have not heard about the biopsies in 3 weeks.  Our staff will call the home number listed on your records the next business day following your procedure to check on you and address any questions or concerns that you may have at that time regarding the information given to you following your procedure. This is a courtesy call and so if there is no answer at the home number and we have not heard from you through the emergency physician on call, we will assume that you have returned to your regular daily activities without incident.  SIGNATURES/CONFIDENTIALITY: You and/or your care partner have signed paperwork which will be entered into your electronic medical record.  These signatures attest to the fact that that the information above on your After Visit Summary has been reviewed and is understood.  Full responsibility of the confidentiality   of this discharge information lies with you and/or your care-partner.     

## 2012-04-13 NOTE — Op Note (Signed)
[facility] [place], [place]  [id number]  COLONOSCOPY PROCEDURE REPORT  PATIENT:  Taylor Bush, Taylor Bush  MR#:  [id number] BIRTHDATE:  [birth year], 31 yrs. old  GENDER:  female ENDOSCOPIST:  Lowella Bandy. Olevia Perches, MD REF. BY:  Frederico Hamman T. Copland, M.D. PROCEDURE DATE:  04/13/2012 PROCEDURE:  Colonoscopy with biopsy and snare polypectomy ASA CLASS:  Class II INDICATIONS:  Routine Risk Screening MEDICATIONS:   MAC sedation, administered by CRNA, propofol (Diprivan) 240 mg  DESCRIPTION OF PROCEDURE:   After the risks and benefits and of the procedure were explained, informed consent was obtained. Digital rectal exam was performed and revealed no rectal masses. The LB [id number] endoscope was introduced through the anus and advanced to the cecum, which was identified by both the appendix and ileocecal valve.  The quality of the prep was excellent, using MoviPrep.  The instrument was then slowly withdrawn as the colon was fully examined. <<PROCEDUREIMAGES>>  FINDINGS:  Three polyps were found in the sigmoid colon. at 15,20 and 30 cm 3-5 mm sessile polyps The polyps were removed using cold biopsy forceps. Polyp was snared without cautery. Retrieval was successful (see image7 and image5). snare polyp  Mild diverticulosis was found in the sigmoid colon (see image4).  This was otherwise a normal examination of the colon (see image8, image2, image1, and image3).   Retroflexed views in the rectum revealed no abnormalities.    The scope was then withdrawn from the patient and the procedure completed.  COMPLICATIONS:  None ENDOSCOPIC IMPRESSION: 1) Three polyps in the sigmoid colon 2) Mild diverticulosis in the sigmoid colon 3) Otherwise normal examination RECOMMENDATIONS: 1) Await pathology results 2) High fiber diet.  REPEAT EXAM:  In 5 - 10 year(s) for.  if polyps adenomatous, 5 year recall  ______________________________ Lowella Bandy. Olevia Perches, MD  CC:  n. eSIGNED:    Lowella Bandy. Brockton Mckesson at 04/13/2012 09:12 AM  [name], [id number]

## 2012-04-13 NOTE — Progress Notes (Signed)
Patient did not experience any of the following events: a burn prior to discharge; a fall within the facility; wrong site/side/patient/procedure/implant event; or a hospital transfer or hospital admission upon discharge from the facility. (G8907) Patient did not have preoperative order for IV antibiotic SSI prophylaxis. (G8918)  

## 2012-04-16 ENCOUNTER — Telehealth: Payer: Self-pay | Admitting: *Deleted

## 2012-04-16 NOTE — Telephone Encounter (Signed)
  Follow up Call-  Call back number 04/13/2012  Post procedure Call Back phone  # [phone]  Permission to leave phone message Yes     Patient questions:  Do you have a fever, pain , or abdominal swelling? no Pain Score  0 *  Have you tolerated food without any problems? yes  Have you been able to return to your normal activities? yes  Do you have any questions about your discharge instructions: Diet   no Medications  no Follow up visit  no  Do you have questions or concerns about your Care? no  Actions: * If pain score is 4 or above: No action needed, pain <4.

## 2012-04-17 ENCOUNTER — Encounter: Payer: Self-pay | Admitting: Internal Medicine

## 2012-05-16 ENCOUNTER — Telehealth: Payer: Self-pay | Admitting: *Deleted

## 2012-05-16 ENCOUNTER — Other Ambulatory Visit: Payer: Self-pay | Admitting: Family Medicine

## 2012-05-16 DIAGNOSIS — L989 Disorder of the skin and subcutaneous tissue, unspecified: Secondary | ICD-10-CM

## 2012-05-16 NOTE — Telephone Encounter (Signed)
Patient would like a referral to dermatology for a wart on her face to be removed.

## 2012-08-08 ENCOUNTER — Other Ambulatory Visit: Payer: Self-pay | Admitting: Family Medicine

## 2012-08-08 NOTE — Telephone Encounter (Signed)
[facility] request refill amlodipine, lisinioril HCTZ and metoprolol # 30 x 1 with note needs to make appt.

## 2012-08-20 ENCOUNTER — Encounter (INDEPENDENT_AMBULATORY_CARE_PROVIDER_SITE_OTHER): Payer: Medicare Other | Admitting: Ophthalmology

## 2012-08-20 DIAGNOSIS — I10 Essential (primary) hypertension: Secondary | ICD-10-CM

## 2012-08-20 DIAGNOSIS — H3553 Other dystrophies primarily involving the sensory retina: Secondary | ICD-10-CM

## 2012-08-20 DIAGNOSIS — H43819 Vitreous degeneration, unspecified eye: Secondary | ICD-10-CM

## 2012-08-20 DIAGNOSIS — H35039 Hypertensive retinopathy, unspecified eye: Secondary | ICD-10-CM

## 2012-08-20 DIAGNOSIS — H3530 Unspecified macular degeneration: Secondary | ICD-10-CM

## 2012-09-05 ENCOUNTER — Encounter: Payer: Self-pay | Admitting: Family Medicine

## 2012-09-05 ENCOUNTER — Ambulatory Visit (INDEPENDENT_AMBULATORY_CARE_PROVIDER_SITE_OTHER): Payer: Medicare Other | Admitting: Family Medicine

## 2012-09-05 VITALS — BP 124/70 | HR 64 | Temp 98.3°F | Wt 152.5 lb

## 2012-09-05 DIAGNOSIS — E785 Hyperlipidemia, unspecified: Secondary | ICD-10-CM

## 2012-09-05 DIAGNOSIS — Z23 Encounter for immunization: Secondary | ICD-10-CM

## 2012-09-05 DIAGNOSIS — Z79899 Other long term (current) drug therapy: Secondary | ICD-10-CM

## 2012-09-05 DIAGNOSIS — I10 Essential (primary) hypertension: Secondary | ICD-10-CM

## 2012-09-05 MED ORDER — AMLODIPINE BESYLATE 10 MG PO TABS: 10.0000 mg | ORAL_TABLET | Freq: Every day | ORAL | Status: DC

## 2012-09-05 MED ORDER — LISINOPRIL-HYDROCHLOROTHIAZIDE 20-25 MG PO TABS: 1.0000 | ORAL_TABLET | Freq: Every day | ORAL | Status: DC

## 2012-09-05 MED ORDER — METOPROLOL TARTRATE 100 MG PO TABS: 100.0000 mg | ORAL_TABLET | Freq: Two times a day (BID) | ORAL | Status: DC

## 2012-09-05 NOTE — Addendum Note (Signed)
Addended by: Emelia Salisbury C on: 09/05/2012 02:25 PM   Modules accepted: Orders

## 2012-09-05 NOTE — Progress Notes (Signed)
[place] at [facility] [place] [address] Phone: [phone] Fax: [phone]  Date:  09/05/2012   Name:  Taylor Bush   DOB:  11-15-1945   MRN:  [id number] Gender: female Age: 66 y.o.  PCP:  Owens Loffler, MD  Evaluating MD: Owens Loffler, MD   Chief Complaint: Follow-up   History of Present Illness:  Taylor Bush is a 67 y.o. pleasant patient who presents with the following:  HTN: Tolerating all medications without side effects Stable and at goal No CP, no sob. No HA.  BP Readings from Last 3 Encounters:  09/05/12 124/70  04/13/12 121/63  12/24/11 [id number]    [facility] Metabolic Panel:    Component Value Date/Time   NA 130* 12/24/2011 0415   K 4.0 12/24/2011 0415   CL 101 12/24/2011 0415   CO2 22 12/24/2011 0415   BUN 13 12/24/2011 0415   CREATININE 1.01 12/24/2011 0415   GLUCOSE 104* 12/24/2011 0415   CALCIUM 9.1 12/24/2011 0415      Pneumovax -  Flu - done Mammo - 03/2012, normal  Lipids: no recent test - could not tolerate statins  Lipids:    Component Value Date/Time   CHOL 219* 03/19/2009 1014   TRIG 192.0* 03/19/2009 1014   HDL 42.50 03/19/2009 1014   LDLDIRECT 133.3 03/19/2009 1014   VLDL 38.4 03/19/2009 1014   CHOLHDL 5 03/19/2009 1014    Lab Results  Component Value Date   ALT 5 12/19/2011   AST 10 12/19/2011   ALKPHOS 70 12/19/2011   BILITOT 0.2* 12/19/2011       Patient Active Problem List  Diagnosis  . HYPERLIPIDEMIA  . UNSPECIFIED OPEN-ANGLE GLAUCOMA  . UNSPECIFIED CATARACT  . HYPERTENSION  . ATHEROSCLEROSIS W /INT CLAUDICATION  . BACK PAIN, LUMBAR, WITH RADICULOPATHY  . AVN (avascular necrosis of bone) Right Hip  . Hx of total hip arthroplasty  . Avascular necrosis of left femoral head  . Anemia, iron deficiency  . Postop Acute blood loss anemia  . Postop Transfusion  . Postop Hyponatremia    Past Medical History  Diagnosis Date  . HTN (hypertension)   . HLD (hyperlipidemia)   . Lumbar back pain      with radiculopathy  . Cataract   . Open-angle glaucoma(365.1)   . PAD (peripheral artery disease)     with intermittent claudication, ABI 0.72 on Rt and 0.73 on L when assessed in 2010.   Marland Kitchen Arthritis     bilateral hips   . Hx of total hip arthroplasty 12/15/2011  . Anemia   . Blood transfusion     11/12   . Allergy     Past Surgical History  Procedure Date  . Cataract surgery 2/10  . Abnormal vascular studies 7/10    ABI's 70, R SFA > 50%^ blockage   . Other surgical history     ganglion cyst removed left wrist   . Total hip arthroplasty 10/03/2011    Procedure: TOTAL HIP ARTHROPLASTY;  Surgeon: Dione Plover Aluisio;  Location: [facility];  Service: Orthopedics;  Laterality: Right;  . Total hip arthroplasty 12/21/2011    Procedure: TOTAL HIP ARTHROPLASTY;  Surgeon: Gearlean Alf, MD;  Location: [facility];  Service: Orthopedics;  Laterality: Left;  . Tubal ligation     History  Substance Use Topics  . Smoking status: Former Smoker    Quit date: 11/07/2008  . Smokeless tobacco: Never Used  . Alcohol Use: No  Family History  Problem Relation Age of Onset  . Heart attack Father     75s  . Stroke Neg Hx     also no PAD or aneurysm  . Colon cancer Neg Hx   . Esophageal cancer Neg Hx   . Stomach cancer Neg Hx   . Rectal cancer Neg Hx     Allergies  Allergen Reactions  . Statins Rash    "hands broke out"    Medication list has been reviewed and updated.  Outpatient Prescriptions Prior to Visit  Medication Sig Dispense Refill  . amLODipine (NORVASC) 10 MG tablet TAKE 1 TABLET (10 MG TOTAL) BY MOUTH DAILY.  30 tablet  1  . aspirin 81 MG tablet Take 81 mg by mouth daily.      . diphenhydrAMINE (BENADRYL) 25 mg capsule Take 25 mg by mouth daily as needed. Allergies       . docusate sodium (COLACE) 50 MG capsule Take 100 mg by mouth as needed. Pt takes [facility] Colace 100mg  as needed for constipation      . lisinopril-hydrochlorothiazide (PRINZIDE,ZESTORETIC) 20-25 MG per  tablet TAKE 1 TABLET BY MOUTH DAILY.  30 tablet  1  . metoprolol (LOPRESSOR) 100 MG tablet TAKE 1 TABLET (100 MG TOTAL) BY MOUTH 2 (TWO) TIMES DAILY.  60 tablet  1  . amLODipine (NORVASC) 10 MG tablet Take 10 mg by mouth daily before breakfast.      . lisinopril-hydrochlorothiazide (PRINZIDE,ZESTORETIC) 20-25 MG per tablet Take 1 tablet by mouth every morning.       . polysaccharide iron (NIFEREX) 150 MG CAPS capsule Take 1 capsule (150 mg total) by mouth 2 (two) times daily.  42 each  0    Review of Systems:   GEN: No acute illnesses, no fevers, chills. GI: No n/v/d, eating normally Pulm: No SOB Interactive and getting along well at home.  Otherwise, ROS is as per the HPI.   Physical Examination: Filed Vitals:   09/05/12 1119  BP: 124/70  Pulse: 64  Temp: 98.3 F (36.8 C)  TempSrc: Oral  Weight: 152 lb 8 oz (69.174 kg)    There is no height on file to calculate BMI. Ideal Body Weight:     GEN: WDWN, NAD, Non-toxic, A & O x 3 HEENT: Atraumatic, Normocephalic. Neck supple. No masses, No LAD. Ears and Nose: No external deformity. CV: RRR, No M/G/R. No JVD. No thrill. No extra heart sounds. PULM: CTA B, no wheezes, crackles, rhonchi. No retractions. No resp. distress. No accessory muscle use. EXTR: No c/c/e NEURO Normal gait.  PSYCH: Normally interactive. Conversant. Not depressed or anxious appearing.  Calm demeanor.    Assessment and Plan:  1. HYPERTENSION    2. Encounter for long-term (current) use of other medications  LDL cholesterol, direct, Bush metabolic panel  3. HYPERLIPIDEMIA     Refill all meds Check labs Pneumovax Assess chol status  Orders Today:  Orders Placed This Encounter  Procedures  . LDL cholesterol, direct  . Bush metabolic panel    Updated Medication List: (Includes new medications, updates to list, dose adjustments) Meds ordered this encounter  Medications  . amLODipine (NORVASC) 10 MG tablet    Sig: Take 1 tablet (10 mg total) by  mouth daily.    Dispense:  30 tablet    Refill:  11  . lisinopril-hydrochlorothiazide (PRINZIDE,ZESTORETIC) 20-25 MG per tablet    Sig: Take 1 tablet by mouth daily.    Dispense:  30 tablet  Refill:  11  . metoprolol (LOPRESSOR) 100 MG tablet    Sig: Take 1 tablet (100 mg total) by mouth 2 (two) times daily.    Dispense:  60 tablet    Refill:  11    Medications Discontinued: Medications Discontinued During This Encounter  Medication Reason  . amLODipine (NORVASC) 10 MG tablet Reorder  . lisinopril-hydrochlorothiazide (PRINZIDE,ZESTORETIC) 20-25 MG per tablet Reorder  . metoprolol (LOPRESSOR) 100 MG tablet Reorder     Owens Loffler, MD

## 2012-09-06 ENCOUNTER — Other Ambulatory Visit (INDEPENDENT_AMBULATORY_CARE_PROVIDER_SITE_OTHER): Payer: Medicare Other

## 2012-09-06 ENCOUNTER — Encounter: Payer: Self-pay | Admitting: *Deleted

## 2012-09-06 DIAGNOSIS — Z7901 Long term (current) use of anticoagulants: Secondary | ICD-10-CM

## 2012-09-06 LAB — BASIC METABOLIC PANEL
BUN: 21 mg/dL (ref 6–23)
CO2: 25 mEq/L (ref 19–32)
Chloride: 101 mEq/L (ref 96–112)
Glucose, Bld: 98 mg/dL (ref 70–99)
Potassium: 4.6 mEq/L (ref 3.5–5.1)

## 2012-10-04 ENCOUNTER — Other Ambulatory Visit: Payer: Self-pay | Admitting: Family Medicine

## 2012-12-02 ENCOUNTER — Other Ambulatory Visit: Payer: Self-pay | Admitting: Family Medicine

## 2013-01-27 IMAGING — CR DG HIP COMPLETE 2+V*R*
3 series · 3 of 3 positions shown · non-contrast
Comparison: 08/01/2011

CLINICAL DATA: Right hip pain.

RIGHT HIP - COMPLETE 2+ VIEW

[t pelvis a.p.]
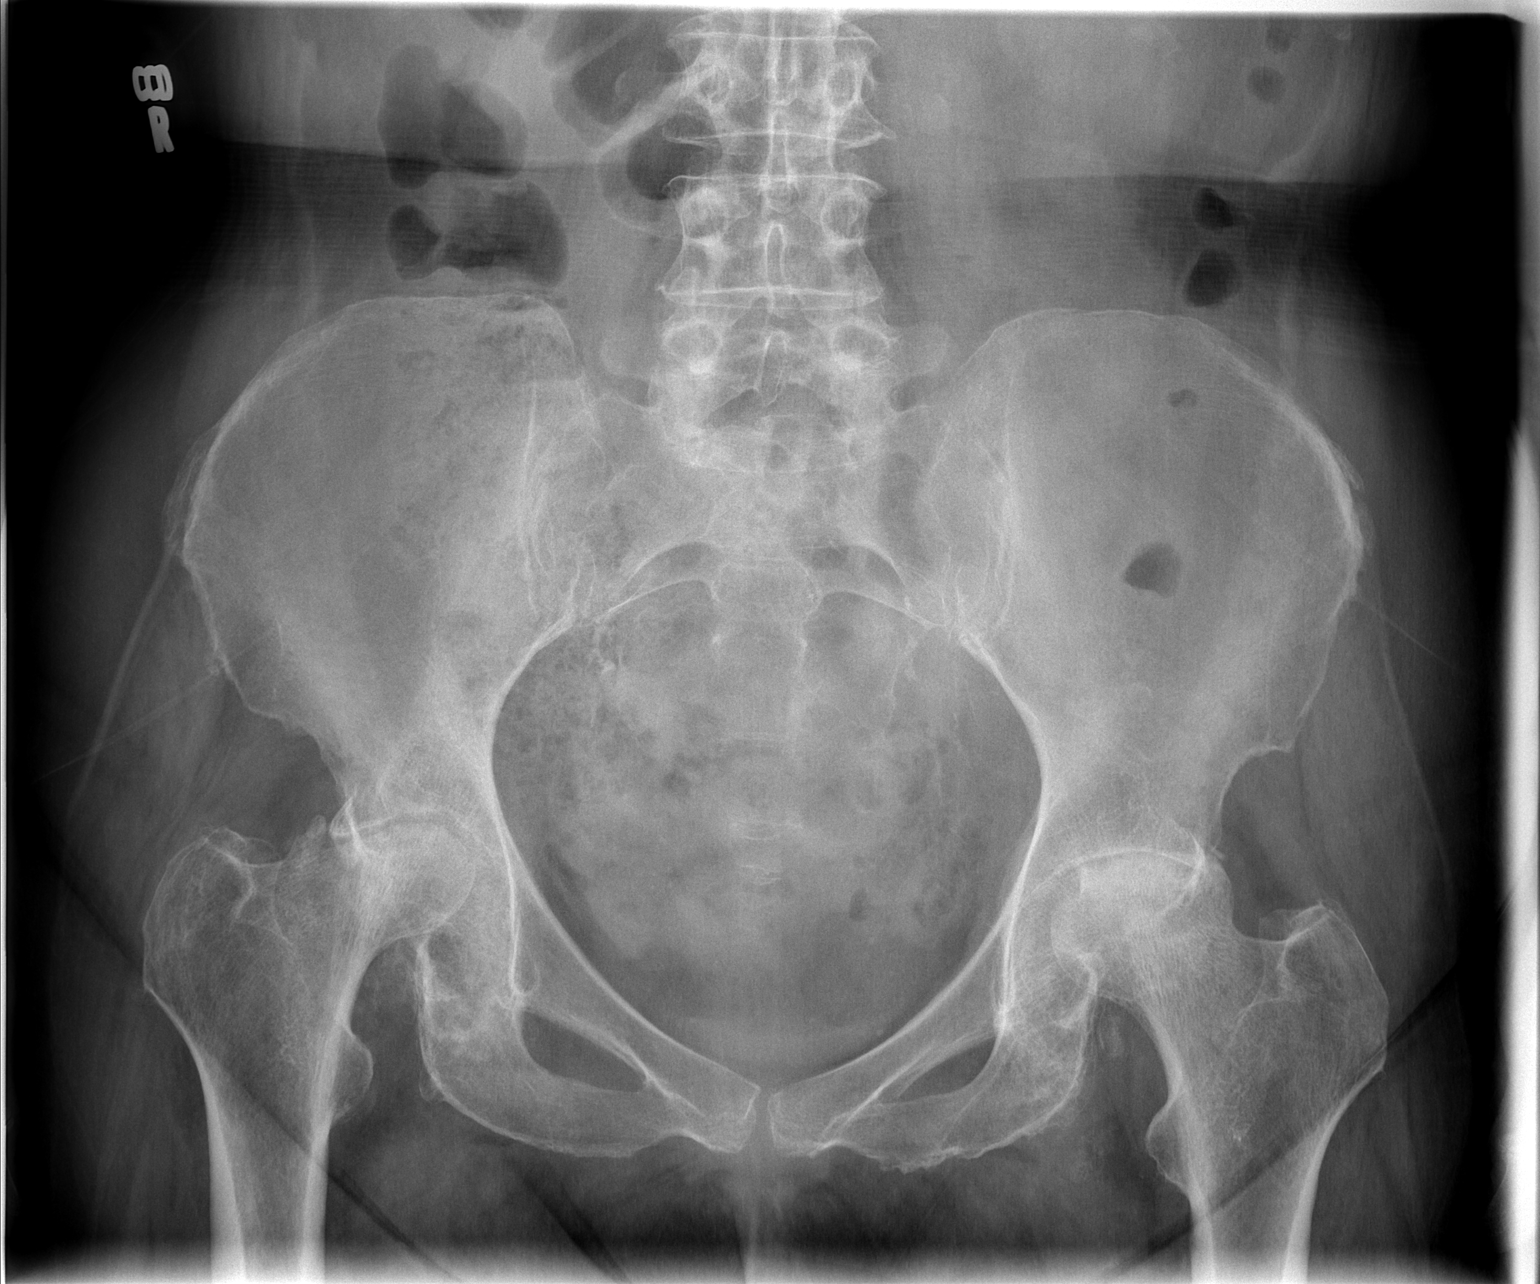

[t hip ap right]
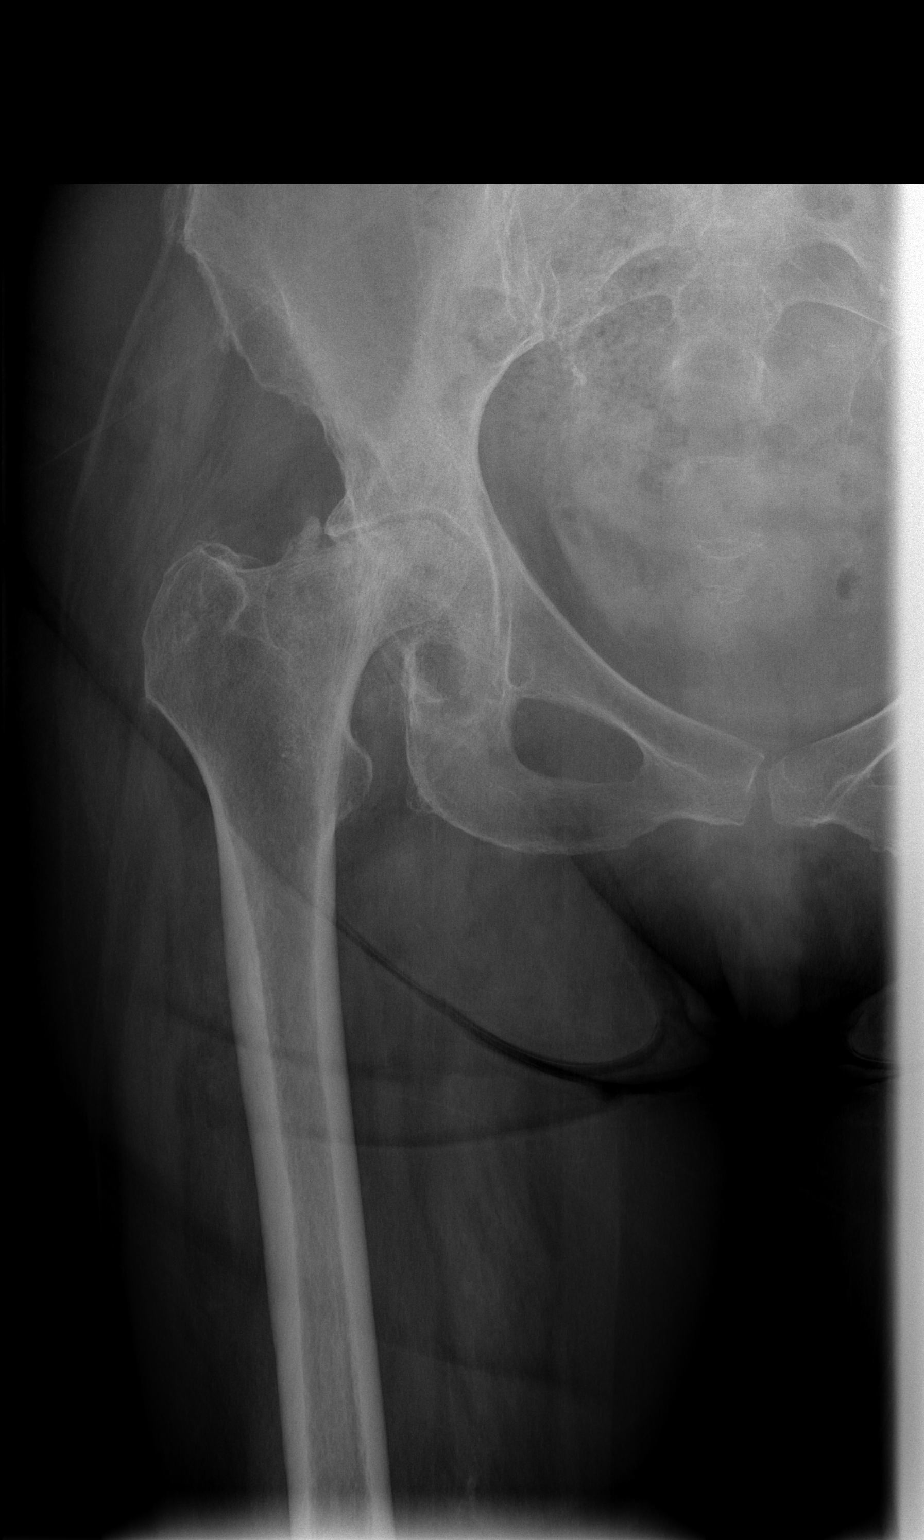

[t hip frog leg right]
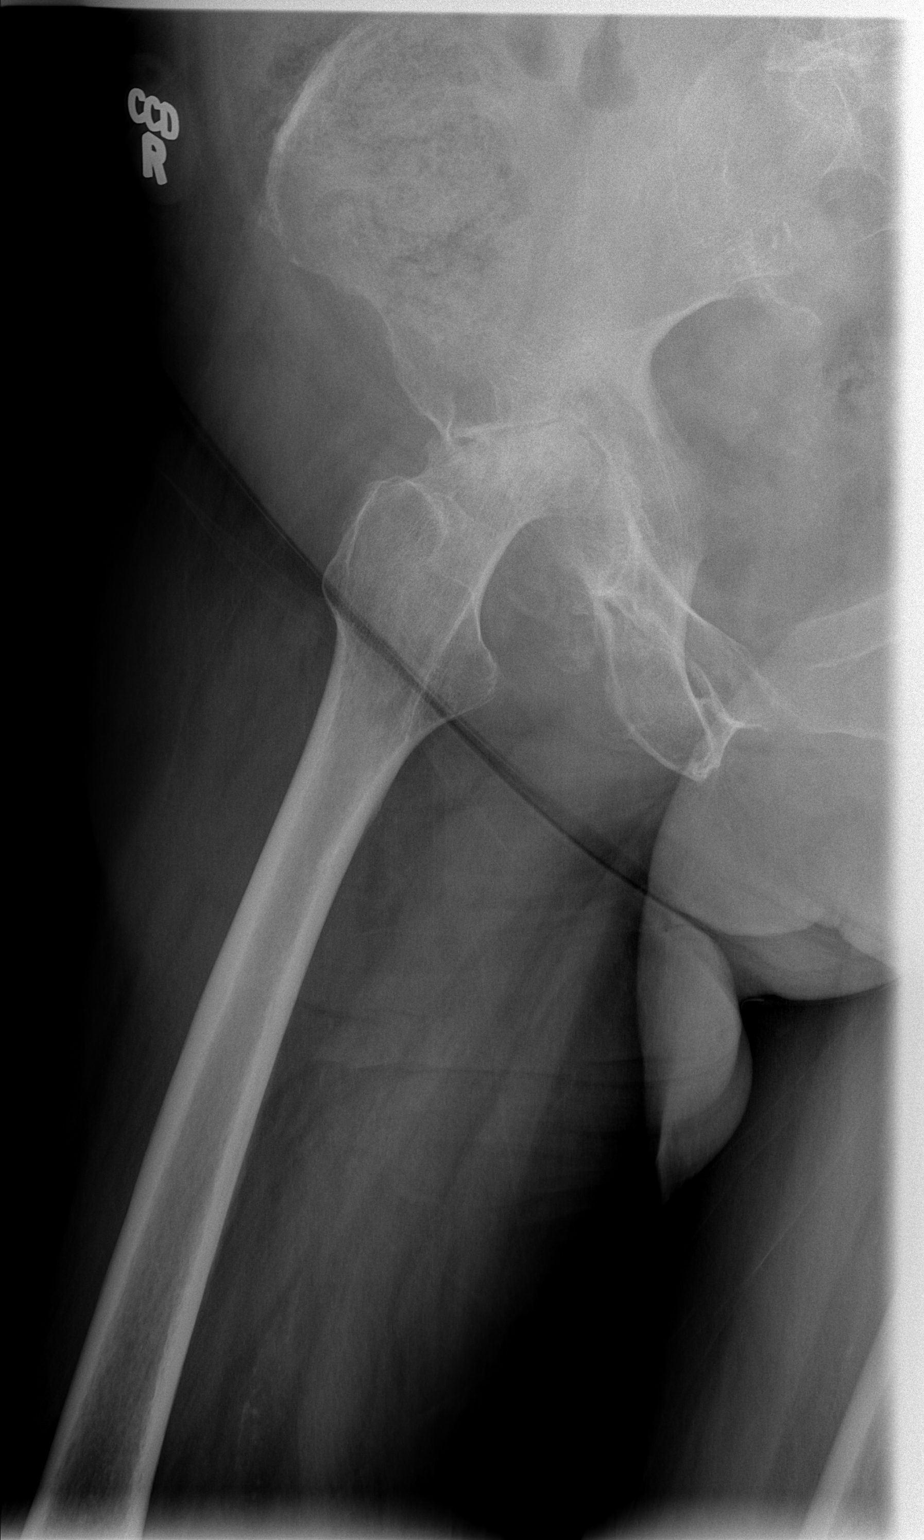

[3 of 3 positions shown; findings below may reference images not displayed]

FINDINGS: Progression of bilateral hip avascular necrosis, worse on
the right, with collapse of the femoral head and marked flattening.
Reactive changes are seen in the acetabulum.  Pubic symphysis and
SI joints are intact.
IMPRESSION: Progression of bilateral hip AVN since 08/01/2011, worse on the
right.

## 2013-02-07 IMAGING — CR DG PELVIS 1-2V
2 series · 2 of 2 positions shown · non-contrast
Comparison: Pelvis right hip radiographs 09/22/2011

CLINICAL DATA: Postop

PELVIS - 1-2 VIEW

[view not recorded (1 of 2)]
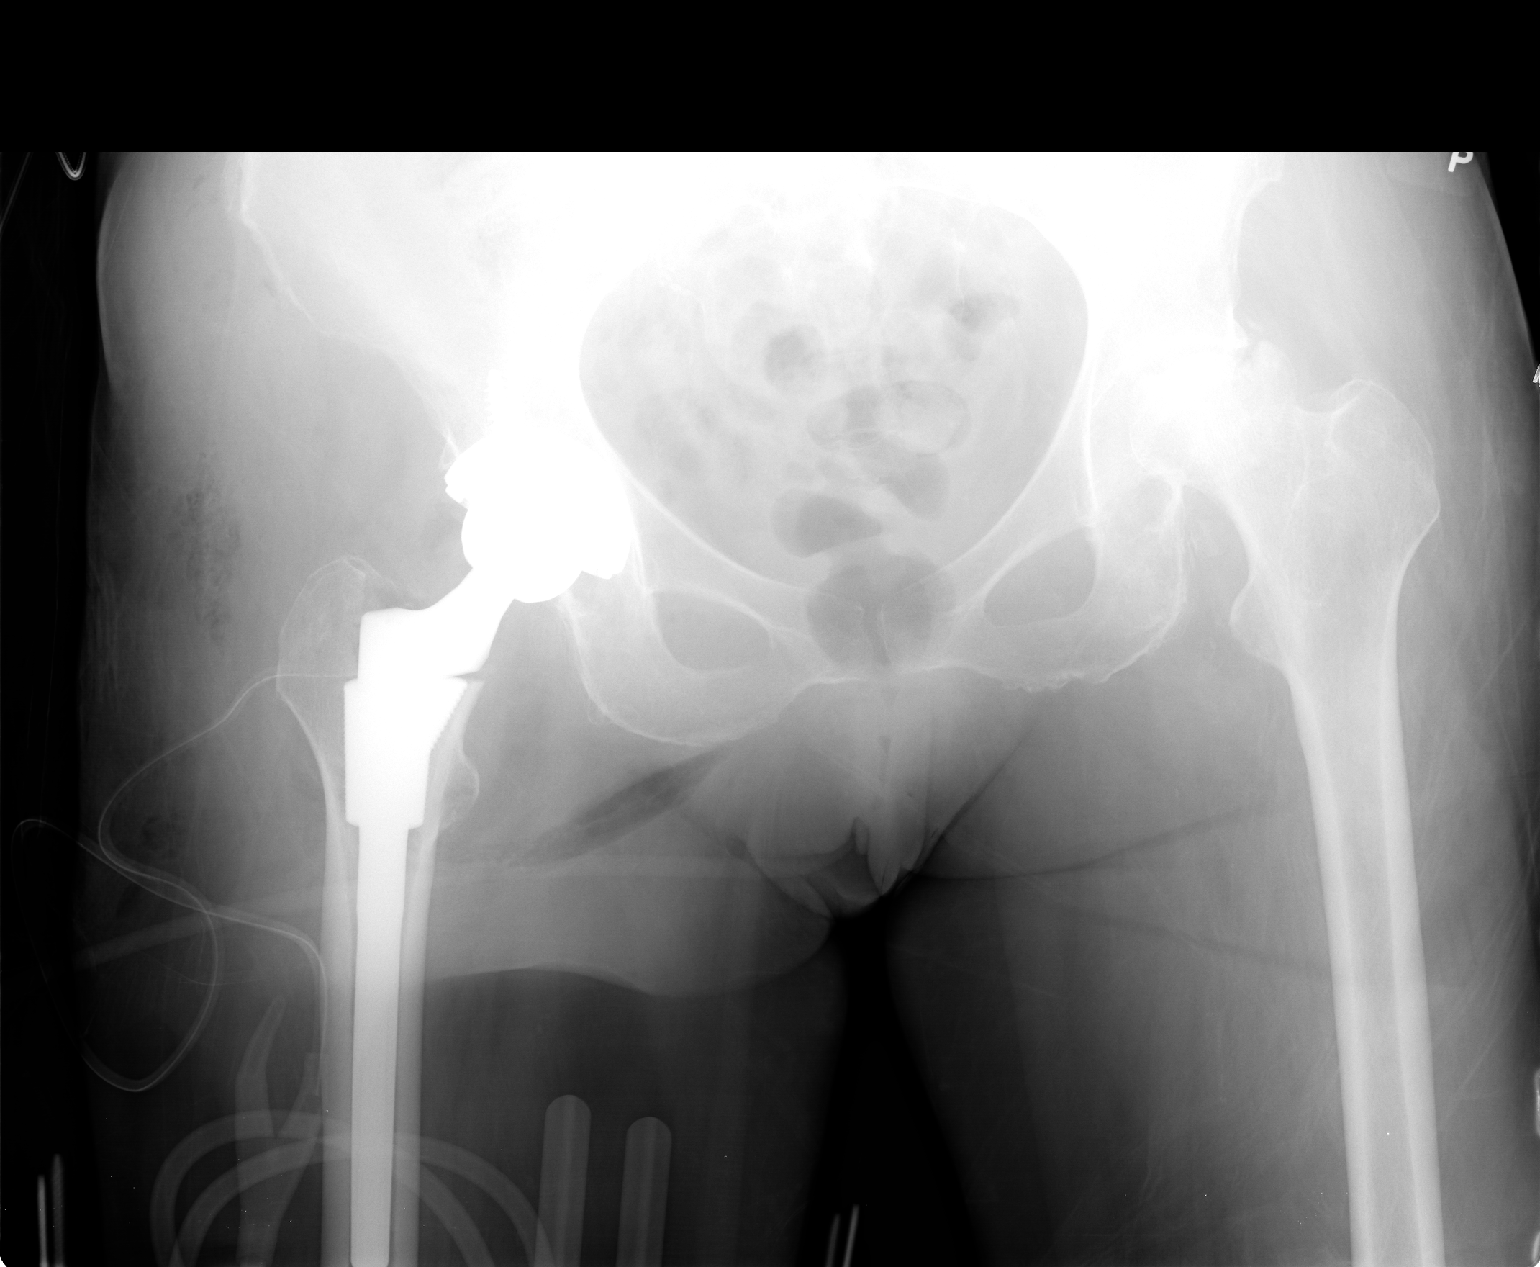

[view not recorded (2 of 2)]
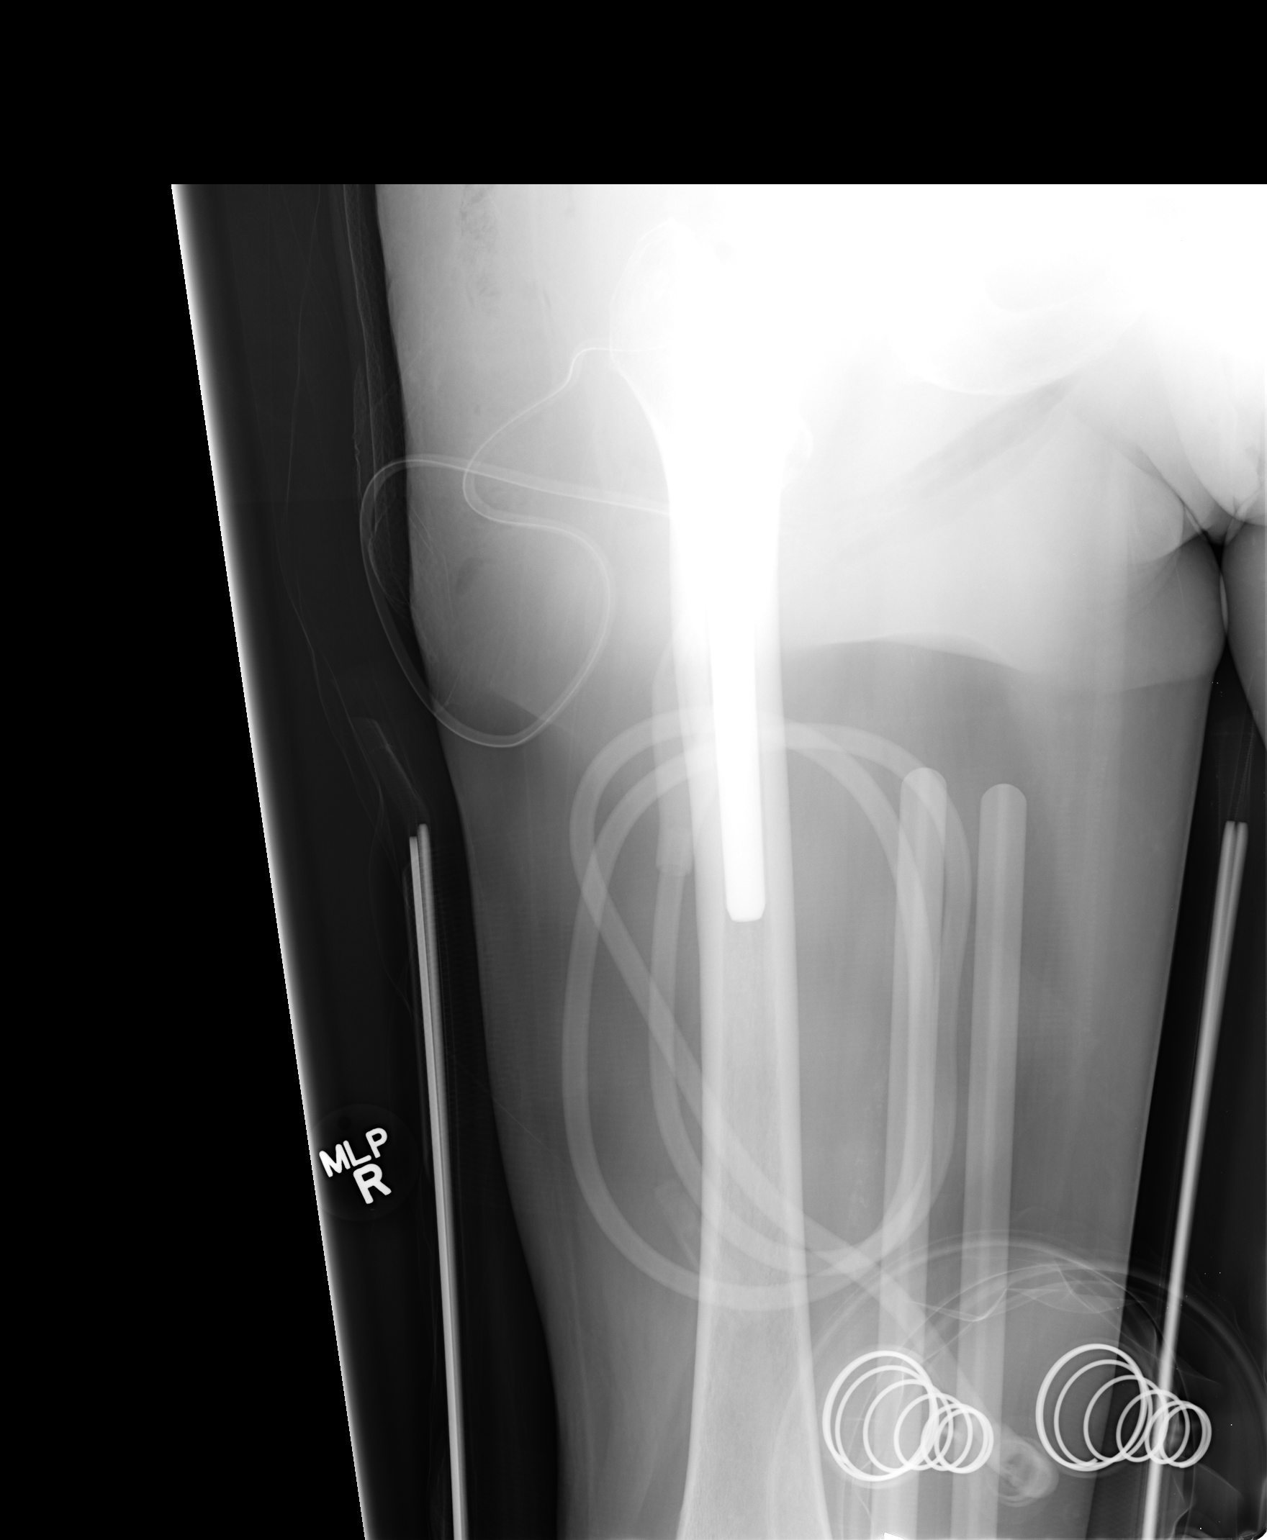

[2 of 2 positions shown; findings below may reference images not displayed]

FINDINGS: There are immediate postoperative changes of right total
hip arthroplasty.  Arthroplasty is located.  There is no evidence
of periprosthetic fracture.  No hardware complication is seen.
Expected locules of subcutaneous gas noted about the right hip and
there is a soft tissue drain.

Again noted are changes of left femoral head avascular necrosis,
stable.
IMPRESSION: 1.  Right total hip arthroplasty.  No complicating features.
2.  Left femoral head avascular necrosis, stable.

## 2013-09-25 ENCOUNTER — Encounter: Payer: Self-pay | Admitting: Family Medicine

## 2013-10-10 ENCOUNTER — Other Ambulatory Visit: Payer: Self-pay | Admitting: Family Medicine

## 2013-11-06 ENCOUNTER — Other Ambulatory Visit: Payer: Self-pay | Admitting: Family Medicine

## 2013-11-25 ENCOUNTER — Ambulatory Visit: Payer: Medicare Other | Admitting: Family Medicine

## 2013-11-28 ENCOUNTER — Ambulatory Visit (INDEPENDENT_AMBULATORY_CARE_PROVIDER_SITE_OTHER): Payer: Medicare Other | Admitting: Family Medicine

## 2013-11-28 ENCOUNTER — Encounter: Payer: Self-pay | Admitting: Family Medicine

## 2013-11-28 VITALS — BP 128/70 | HR 56 | Temp 98.3°F | Ht 62.0 in | Wt 160.0 lb

## 2013-11-28 DIAGNOSIS — R5381 Other malaise: Secondary | ICD-10-CM

## 2013-11-28 DIAGNOSIS — E785 Hyperlipidemia, unspecified: Secondary | ICD-10-CM

## 2013-11-28 DIAGNOSIS — Z13220 Encounter for screening for lipoid disorders: Secondary | ICD-10-CM

## 2013-11-28 DIAGNOSIS — R5383 Other fatigue: Secondary | ICD-10-CM

## 2013-11-28 DIAGNOSIS — Z79899 Other long term (current) drug therapy: Secondary | ICD-10-CM

## 2013-11-28 DIAGNOSIS — N183 Chronic kidney disease, stage 3 unspecified: Secondary | ICD-10-CM

## 2013-11-28 DIAGNOSIS — I10 Essential (primary) hypertension: Secondary | ICD-10-CM

## 2013-11-28 LAB — CBC WITH DIFFERENTIAL/PLATELET
Basophils Absolute: 0.1 10*3/uL (ref 0.0–0.1)
Basophils Relative: 0.5 % (ref 0.0–3.0)
Eosinophils Absolute: 0.4 10*3/uL (ref 0.0–0.7)
Eosinophils Relative: 3.9 % (ref 0.0–5.0)
HEMATOCRIT: 30.5 % — AB (ref 36.0–46.0)
HEMOGLOBIN: 10.5 g/dL — AB (ref 12.0–15.0)
LYMPHS ABS: 2.2 10*3/uL (ref 0.7–4.0)
Lymphocytes Relative: 20.2 % (ref 12.0–46.0)
MCHC: 34.6 g/dL (ref 30.0–36.0)
MCV: 91.6 fl (ref 78.0–100.0)
MONO ABS: 1 10*3/uL (ref 0.1–1.0)
MONOS PCT: 9.6 % (ref 3.0–12.0)
NEUTROS ABS: 7.2 10*3/uL (ref 1.4–7.7)
Neutrophils Relative %: 65.8 % (ref 43.0–77.0)
PLATELETS: 293 10*3/uL (ref 150.0–400.0)
RBC: 3.33 Mil/uL — ABNORMAL LOW (ref 3.87–5.11)
RDW: 13.5 % (ref 11.5–14.6)
WBC: 10.9 10*3/uL — ABNORMAL HIGH (ref 4.5–10.5)

## 2013-11-28 LAB — HEPATIC FUNCTION PANEL
ALBUMIN: 4 g/dL (ref 3.5–5.2)
ALT: 15 U/L (ref 0–35)
AST: 15 U/L (ref 0–37)
Alkaline Phosphatase: 70 U/L (ref 39–117)
BILIRUBIN TOTAL: 0.4 mg/dL (ref 0.3–1.2)
Bilirubin, Direct: 0 mg/dL (ref 0.0–0.3)
Total Protein: 7.5 g/dL (ref 6.0–8.3)

## 2013-11-28 LAB — BASIC METABOLIC PANEL
BUN: 25 mg/dL — AB (ref 6–23)
CHLORIDE: 105 meq/L (ref 96–112)
CO2: 24 mEq/L (ref 19–32)
Calcium: 9.1 mg/dL (ref 8.4–10.5)
Creatinine, Ser: 1.6 mg/dL — ABNORMAL HIGH (ref 0.4–1.2)
GFR: 33.89 mL/min — AB (ref >60.00)
Glucose, Bld: 94 mg/dL (ref 70–99)
POTASSIUM: 4.7 meq/L (ref 3.5–5.1)
SODIUM: 136 meq/L (ref 135–145)

## 2013-11-28 LAB — LIPID PANEL
CHOLESTEROL: 251 mg/dL — AB (ref 0–200)
HDL: 41.6 mg/dL (ref >39.00)
TRIGLYCERIDES: 236 mg/dL — AB (ref 0.0–149.0)
Total CHOL/HDL Ratio: 6
VLDL: 47.2 mg/dL — ABNORMAL HIGH (ref 0.0–40.0)

## 2013-11-28 LAB — TSH: TSH: 2.33 u[IU]/mL (ref 0.35–5.50)

## 2013-11-28 LAB — LDL CHOLESTEROL, DIRECT: Direct LDL: 167.5 mg/dL

## 2013-11-28 MED ORDER — LISINOPRIL-HYDROCHLOROTHIAZIDE 20-25 MG PO TABS: 1.0000 | ORAL_TABLET | Freq: Every day | ORAL | Status: DC

## 2013-11-28 MED ORDER — METOPROLOL SUCCINATE ER 200 MG PO TB24: 200.0000 mg | ORAL_TABLET | Freq: Every day | ORAL | Status: DC

## 2013-11-28 MED ORDER — AMLODIPINE BESYLATE 10 MG PO TABS: 10.0000 mg | ORAL_TABLET | Freq: Every day | ORAL | Status: DC

## 2013-11-28 NOTE — Progress Notes (Signed)
Pre-visit discussion using our clinic review tool. No additional management support is needed unless otherwise documented below in the visit note.  

## 2013-11-28 NOTE — Progress Notes (Signed)
Date:  11/28/2013   Name:  Taylor Bush   DOB:  1946-08-22   MRN:  [id number] Gender: female Age: 68 y.o.  Primary Physician:  Owens Loffler, MD   Chief Complaint: Medication Refill   Subjective:   History of Present Illness:  Taylor Bush is a 68 y.o. pleasant patient who presents with the following:  HTN: Tolerating all medications without side effects Stable and at goal No CP, no sob. No HA.  BP Readings from Last 3 Encounters:  11/28/13 128/70  09/05/12 124/70  04/13/12 [id number]    Basic Metabolic Panel:    Component Value Date/Time   NA 136 11/28/2013 1148   K 4.7 11/28/2013 1148   CL 105 11/28/2013 1148   CO2 24 11/28/2013 1148   BUN 25* 11/28/2013 1148   CREATININE 1.6* 11/28/2013 1148   GLUCOSE 94 11/28/2013 1148   CALCIUM 9.1 11/28/2013 1148     Lipids: need recheck Panel reviewed with patient.  Lipids:    Component Value Date/Time   CHOL 251* 11/28/2013 1148   TRIG 236.0* 11/28/2013 1148   HDL 41.60 11/28/2013 1148   LDLDIRECT 167.5 11/28/2013 1148   VLDL 47.2* 11/28/2013 1148   CHOLHDL 6 11/28/2013 1148    Lab Results  Component Value Date   ALT 15 11/28/2013   AST 15 11/28/2013   ALKPHOS 70 11/28/2013   BILITOT 0.4 11/28/2013     Patient Active Problem List   Diagnosis Date Noted  . Avascular necrosis of left femoral head 12/16/2011  . Anemia, iron deficiency 12/16/2011  . Hx of total hip arthroplasty 12/15/2011  . AVN (avascular necrosis of bone) Right Hip 10/04/2011  . ATHEROSCLEROSIS W /INT CLAUDICATION 06/17/2009  . BACK PAIN, LUMBAR, WITH RADICULOPATHY 06/03/2009  . HYPERLIPIDEMIA 12/15/2008  . HYPERTENSION 12/15/2008  . UNSPECIFIED OPEN-ANGLE GLAUCOMA 10/27/2008  . UNSPECIFIED CATARACT 10/27/2008    Past Medical History  Diagnosis Date  . HTN (hypertension)   . HLD (hyperlipidemia)   . Lumbar back pain     with radiculopathy  . Cataract   . Open-angle glaucoma   . PAD (peripheral artery disease)     with intermittent claudication,  ABI 0.72 on Rt and 0.73 on L when assessed in 2010.   Marland Kitchen Arthritis     bilateral hips   . Hx of total hip arthroplasty 12/15/2011  . Anemia   . Blood transfusion     11/12   . Allergy     Past Surgical History  Procedure Laterality Date  . Cataract surgery  2/10  . Abnormal vascular studies  7/10    ABI's 70, R SFA > 50%^ blockage   . Other surgical history      ganglion cyst removed left wrist   . Total hip arthroplasty  10/03/2011    Procedure: TOTAL HIP ARTHROPLASTY;  Surgeon: Dione Plover Aluisio;  Location: [facility];  Service: Orthopedics;  Laterality: Right;  . Total hip arthroplasty  12/21/2011    Procedure: TOTAL HIP ARTHROPLASTY;  Surgeon: Gearlean Alf, MD;  Location: [facility];  Service: Orthopedics;  Laterality: Left;  . Tubal ligation      History   Social History  . Marital Status: Married    Spouse Name: N/A    Number of Children: N/A  . Years of Education: N/A   Occupational History  . Not on file.   Social History Main Topics  . Smoking status: Former Smoker    Quit date: 11/07/2008  . Smokeless tobacco: Never  Used  . Alcohol Use: No  . Drug Use: No  . Sexual Activity: Not on file   Other Topics Concern  . 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     Family History  Problem Relation Age of Onset  . Heart attack Father     59s  . Stroke Neg Hx     also no PAD or aneurysm  . Colon cancer Neg Hx   . Esophageal cancer Neg Hx   . Stomach cancer Neg Hx   . Rectal cancer Neg Hx     Allergies  Allergen Reactions  . Statins Rash    "hands broke out"    Medication list has been reviewed and updated.  Review of Systems:   GEN: No acute illnesses, no fevers, chills. GI: No n/v/d, eating normally Pulm: No SOB Interactive and getting along well at home.  Otherwise, ROS is as per the HPI.  Objective:   Physical Examination: BP 128/70  Pulse 56  Temp(Src) 98.3 F (36.8 C) (Oral)  Ht 5\' 2"   (1.575 m)  Wt 160 lb (72.576 kg)  BMI 29.26 kg/m2  SpO2 98%  Ideal Body Weight: Weight in (lb) to have BMI = 25: 136.4   GEN: WDWN, NAD, Non-toxic, A & O x 3 HEENT: Atraumatic, Normocephalic. Neck supple. No masses, No LAD. Ears and Nose: No external deformity. CV: RRR, No M/G/R. No JVD. No thrill. No extra heart sounds. PULM: CTA B, no wheezes, crackles, rhonchi. No retractions. No resp. distress. No accessory muscle use. EXTR: No c/c/e NEURO Normal gait.  PSYCH: Normally interactive. Conversant. Not depressed or anxious appearing.  Calm demeanor.   Laboratory and Imaging Data: Results for orders placed in visit on 123456  BASIC METABOLIC PANEL      Result Value Range   Sodium 136  135 - 145 mEq/L   Potassium 4.7  3.5 - 5.1 mEq/L   Chloride 105  96 - 112 mEq/L   CO2 24  19 - 32 mEq/L   Glucose, Bld 94  70 - 99 mg/dL   BUN 25 (*) 6 - 23 mg/dL   Creatinine, Ser 1.6 (*) 0.4 - 1.2 mg/dL   Calcium 9.1  8.4 - 10.5 mg/dL   GFR 33.89 (*) >60.00 mL/min  CBC WITH DIFFERENTIAL      Result Value Range   WBC 10.9 (*) 4.5 - 10.5 K/uL   RBC 3.33 (*) 3.87 - 5.11 Mil/uL   Hemoglobin 10.5 (*) 12.0 - 15.0 g/dL   HCT 30.5 (*) 36.0 - 46.0 %   MCV 91.6  78.0 - 100.0 fl   MCHC 34.6  30.0 - 36.0 g/dL   RDW 13.5  11.5 - 14.6 %   Platelets 293.0  150.0 - 400.0 K/uL   Neutrophils Relative % 65.8  43.0 - 77.0 %   Lymphocytes Relative 20.2  12.0 - 46.0 %   Monocytes Relative 9.6  3.0 - 12.0 %   Eosinophils Relative 3.9  0.0 - 5.0 %   Basophils Relative 0.5  0.0 - 3.0 %   Neutro Abs 7.2  1.4 - 7.7 K/uL   Lymphs Abs 2.2  0.7 - 4.0 K/uL   Monocytes Absolute 1.0  0.1 - 1.0 K/uL   Eosinophils Absolute 0.4  0.0 - 0.7 K/uL   Basophils Absolute 0.1  0.0 - 0.1 K/uL  HEPATIC FUNCTION PANEL      Result Value Range  Total Bilirubin 0.4  0.3 - 1.2 mg/dL   Bilirubin, Direct 0.0  0.0 - 0.3 mg/dL   Alkaline Phosphatase 70  39 - 117 U/L   AST 15  0 - 37 U/L   ALT 15  0 - 35 U/L   Total Protein 7.5   6.0 - 8.3 g/dL   Albumin 4.0  3.5 - 5.2 g/dL  LIPID PANEL      Result Value Range   Cholesterol 251 (*) 0 - 200 mg/dL   Triglycerides 236.0 (*) 0.0 - 149.0 mg/dL   HDL 41.60  >39.00 mg/dL   VLDL 47.2 (*) 0.0 - 40.0 mg/dL   Total CHOL/HDL Ratio 6    TSH      Result Value Range   TSH 2.33  0.35 - 5.50 uIU/mL  LDL CHOLESTEROL, DIRECT      Result Value Range   Direct LDL 167.5       Assessment & Plan:    HYPERTENSION  Encounter for long-term (current) use of other medications - Plan: Basic metabolic panel, CBC with Differential, Hepatic function panel  Screening for lipoid disorders - Plan: Lipid panel  Other malaise and fatigue - Plan: TSH  HYPERLIPIDEMIA  CKD (chronic kidney disease) stage 3, GFR 30-59 ml/min  Blood pressure is stable on her current regimen. Unfortunately her creatinine has increased to 1.6.  Cholesterol is also quite high. On the call the patient and go over potential options for her.  Patient Instructions  [facility]  -- TENS units, how much are they?   Orders Placed This Encounter  Procedures  . Basic metabolic panel  . CBC with Differential  . Hepatic function panel  . Lipid panel  . TSH  . LDL cholesterol, direct    New medications, updates to list, dose adjustments: Meds ordered this encounter  Medications  . DISCONTD: lisinopril-hydrochlorothiazide (PRINZIDE,ZESTORETIC) 20-25 MG per tablet    Sig: Take 1 tablet by mouth daily.  Marland Kitchen DISCONTD: metoprolol (LOPRESSOR) 100 MG tablet    Sig: Take 100 mg by mouth 2 (two) times daily.  Marland Kitchen amLODipine (NORVASC) 10 MG tablet    Sig: Take 1 tablet (10 mg total) by mouth daily before breakfast.    Dispense:  30 tablet    Refill:  11  . lisinopril-hydrochlorothiazide (PRINZIDE,ZESTORETIC) 20-25 MG per tablet    Sig: Take 1 tablet by mouth daily.    Dispense:  30 tablet    Refill:  11  . metoprolol (TOPROL-XL) 200 MG 24 hr tablet    Sig: Take 1 tablet (200 mg total) by mouth daily.     Dispense:  30 tablet    Refill:  11    Signed,  Tatijana Bierly T. Johnanna Bakke, MD, [place] at [facility] [place] [address] Phone: [phone] Fax: [phone]    Medication List       This list is accurate as of: 11/28/13 11:59 PM.  Always use your most recent med list.               amLODipine 10 MG tablet  Commonly known as:  NORVASC  Take 1 tablet (10 mg total) by mouth daily before breakfast.     aspirin 81 MG tablet  Take 81 mg by mouth daily.     lisinopril-hydrochlorothiazide 20-25 MG per tablet  Commonly known as:  PRINZIDE,ZESTORETIC  Take 1 tablet by mouth daily.     metoprolol 200 MG 24 hr tablet  Commonly known as:  TOPROL-XL  Take 1 tablet (200 mg total) by mouth daily.

## 2013-11-28 NOTE — Patient Instructions (Signed)
[facility]  -- TENS units, how much are they?

## 2013-11-29 ENCOUNTER — Encounter: Payer: Self-pay | Admitting: Family Medicine

## 2013-11-29 DIAGNOSIS — N183 Chronic kidney disease, stage 3 unspecified: Secondary | ICD-10-CM

## 2013-11-29 HISTORY — DX: Chronic kidney disease, stage 3 unspecified: N18.30

## 2014-09-26 ENCOUNTER — Encounter: Payer: Self-pay | Admitting: Family Medicine

## 2014-10-01 ENCOUNTER — Encounter: Payer: Self-pay | Admitting: Family Medicine

## 2014-12-04 ENCOUNTER — Other Ambulatory Visit: Payer: Self-pay | Admitting: Family Medicine

## 2014-12-04 NOTE — Telephone Encounter (Signed)
Please call and schedule CPE with fasting labs prior with Dr. Copland.  

## 2015-02-06 DIAGNOSIS — H3550 Unspecified hereditary retinal dystrophy: Secondary | ICD-10-CM | POA: Diagnosis not present

## 2015-02-06 DIAGNOSIS — Z961 Presence of intraocular lens: Secondary | ICD-10-CM | POA: Diagnosis not present

## 2015-02-06 DIAGNOSIS — Z0100 Encounter for examination of eyes and vision without abnormal findings: Secondary | ICD-10-CM | POA: Diagnosis not present

## 2015-03-04 ENCOUNTER — Ambulatory Visit (INDEPENDENT_AMBULATORY_CARE_PROVIDER_SITE_OTHER): Payer: Commercial Managed Care - HMO | Admitting: Family Medicine

## 2015-03-04 ENCOUNTER — Encounter: Payer: Self-pay | Admitting: Family Medicine

## 2015-03-04 VITALS — BP 118/62 | HR 54 | Temp 98.6°F | Ht 61.5 in | Wt 158.5 lb

## 2015-03-04 DIAGNOSIS — N183 Chronic kidney disease, stage 3 unspecified: Secondary | ICD-10-CM

## 2015-03-04 DIAGNOSIS — I10 Essential (primary) hypertension: Secondary | ICD-10-CM | POA: Diagnosis not present

## 2015-03-04 DIAGNOSIS — Z23 Encounter for immunization: Secondary | ICD-10-CM | POA: Diagnosis not present

## 2015-03-04 DIAGNOSIS — M7501 Adhesive capsulitis of right shoulder: Secondary | ICD-10-CM | POA: Diagnosis not present

## 2015-03-04 DIAGNOSIS — D519 Vitamin B12 deficiency anemia, unspecified: Secondary | ICD-10-CM

## 2015-03-04 DIAGNOSIS — Z79899 Other long term (current) drug therapy: Secondary | ICD-10-CM | POA: Diagnosis not present

## 2015-03-04 LAB — BASIC METABOLIC PANEL
BUN: 22 mg/dL (ref 6–23)
CHLORIDE: 95 meq/L — AB (ref 96–112)
CO2: 27 mEq/L (ref 19–32)
Calcium: 9.3 mg/dL (ref 8.4–10.5)
Creatinine, Ser: 1.56 mg/dL — ABNORMAL HIGH (ref 0.40–1.20)
GFR: 35.01 mL/min — ABNORMAL LOW (ref >60.00)
GLUCOSE: 86 mg/dL (ref 70–99)
Potassium: 4.5 mEq/L (ref 3.5–5.1)
Sodium: 126 mEq/L — ABNORMAL LOW (ref 135–145)

## 2015-03-04 LAB — CBC WITH DIFFERENTIAL/PLATELET
BASOS PCT: 0.6 % (ref 0.0–3.0)
Basophils Absolute: 0.1 10*3/uL (ref 0.0–0.1)
EOS ABS: 0.3 10*3/uL (ref 0.0–0.7)
EOS PCT: 3.7 % (ref 0.0–5.0)
HCT: 30.9 % — ABNORMAL LOW (ref 36.0–46.0)
HEMOGLOBIN: 10.7 g/dL — AB (ref 12.0–15.0)
LYMPHS PCT: 24.1 % (ref 12.0–46.0)
Lymphs Abs: 2.3 10*3/uL (ref 0.7–4.0)
MCHC: 34.6 g/dL (ref 30.0–36.0)
MCV: 90.9 fl (ref 78.0–100.0)
Monocytes Absolute: 0.9 10*3/uL (ref 0.1–1.0)
Monocytes Relative: 9.1 % (ref 3.0–12.0)
Neutro Abs: 5.9 10*3/uL (ref 1.4–7.7)
Neutrophils Relative %: 62.5 % (ref 43.0–77.0)
Platelets: 307 10*3/uL (ref 150.0–400.0)
RBC: 3.4 Mil/uL — AB (ref 3.87–5.11)
RDW: 13.2 % (ref 11.5–15.5)
WBC: 9.4 10*3/uL (ref 4.0–10.5)

## 2015-03-04 LAB — HEPATIC FUNCTION PANEL
ALBUMIN: 4.2 g/dL (ref 3.5–5.2)
ALT: 10 U/L (ref 0–35)
AST: 15 U/L (ref 0–37)
Alkaline Phosphatase: 58 U/L (ref 39–117)
Bilirubin, Direct: 0.1 mg/dL (ref 0.0–0.3)
Total Bilirubin: 0.4 mg/dL (ref 0.2–1.2)
Total Protein: 7 g/dL (ref 6.0–8.3)

## 2015-03-04 NOTE — Progress Notes (Signed)
Dr. Frederico Hamman T. Taneesha Edgin, MD, [facility] [place], [address] Phone: [phone] Fax: [phone]  03/04/2015  Patient: Taylor [facility], MRN: [id number], DOB: December 08, 1945, 69 y.o.  Primary Physician:  Owens Loffler, MD  Chief Complaint: Hypertension and Shoulder Pain  Subjective:   Taylor Bush is a 69 y.o. very pleasant female patient who presents with the following:  Plugging along.   BP is doing ok.  HTN: Tolerating all medications without side effects Stable and at goal No CP, no sob. No HA.  BP Readings from Last 3 Encounters:  03/04/15 118/62  11/28/13 128/70  09/05/12 [id number]    Basic Metabolic Panel:    Component Value Date/Time   NA 136 11/28/2013 1148   K 4.7 11/28/2013 1148   CL 105 11/28/2013 1148   CO2 24 11/28/2013 1148   BUN 25* 11/28/2013 1148   CREATININE 1.6* 11/28/2013 1148   GLUCOSE 94 11/28/2013 1148   CALCIUM 9.1 11/28/2013 1148   Hips are doing ok.   Prevnar-13  R shoulder. Sometimes in the neck. Early frozen shoulder. She is having a deep ache on the right side, some limitation in motion.  This is mostly in a T-shirt distribution, and it also hurts her to sleep on the right side.  She has not sustained any known injury, and this is basically developed fairly slowly.  Anemia found on labs. CBC:    Component Value Date/Time   WBC 9.4 03/04/2015 1301   HGB 10.7* 03/04/2015 1301   HCT 30.9* 03/04/2015 1301   PLT 307.0 03/04/2015 1301   MCV 90.9 03/04/2015 1301   NEUTROABS 5.9 03/04/2015 1301   LYMPHSABS 2.3 03/04/2015 1301   MONOABS 0.9 03/04/2015 1301   EOSABS 0.3 03/04/2015 1301   BASOSABS 0.1 03/04/2015 1301    Lab Results  Component Value Date   IRON 77 03/05/2015   FERRITIN 566.4* 03/05/2015    Lab Results  Component Value Date   VITAMINB12 163* 03/05/2015    Lab Results  Component Value Date   FOLATE 8.3 03/05/2015     Past Medical History, Surgical  History, Social History, Family History, Problem List, Medications, and Allergies have been reviewed and updated if relevant.   GEN: No acute illnesses, no fevers, chills. GI: No n/v/d, eating normally Pulm: No SOB Interactive and getting along well at home.  Otherwise, ROS is as per the HPI.  Objective:   BP 118/62 mmHg  Pulse 54  Temp(Src) 98.6 F (37 C) (Oral)  Ht 5' 1.5" (1.562 m)  Wt 158 lb 8 oz (71.895 kg)  BMI 29.47 kg/m2  GEN: WDWN, NAD, Non-toxic, A & O x 3 HEENT: Atraumatic, Normocephalic. Neck supple. No masses, No LAD. Ears and Nose: No external deformity. CV: RRR, No M/G/R. No JVD. No thrill. No extra heart sounds. PULM: CTA B, no wheezes, crackles, rhonchi. No retractions. No resp. distress. No accessory muscle use. EXTR: No c/c/e NEURO Normal gait.  PSYCH: Normally interactive. Conversant. Not depressed or anxious appearing.  Calm demeanor.   Right shoulder: Nontender to direct palpation along all bony structures.  Strength is 5/5 in all directions. The patient lacks 25 of terminal abduction, 15 of flexion.  She lacks 10 of external range of motion.  35 of internal range of motion.  This is all compared to the contralateral side.  Other special testing is equivocal.  Laboratory and Imaging Data: Results for orders placed or performed in visit  on [id number]  Basic metabolic panel  Result Value Ref Range   Sodium 126 (L) 135 - 145 mEq/L   Potassium 4.5 3.5 - 5.1 mEq/L   Chloride 95 (L) 96 - 112 mEq/L   CO2 27 19 - 32 mEq/L   Glucose, Bld 86 70 - 99 mg/dL   BUN 22 6 - 23 mg/dL   Creatinine, Ser 1.56 (H) 0.40 - 1.20 mg/dL   Calcium 9.3 8.4 - 10.5 mg/dL   GFR 35.01 (L) >60.00 mL/min  Hepatic function panel  Result Value Ref Range   Total Bilirubin 0.4 0.2 - 1.2 mg/dL   Bilirubin, Direct 0.1 0.0 - 0.3 mg/dL   Alkaline Phosphatase 58 39 - 117 U/L   AST 15 0 - 37 U/L   ALT 10 0 - 35 U/L   Total Protein 7.0 6.0 - 8.3 g/dL   Albumin 4.2 3.5 - 5.2 g/dL  CBC  with Differential/Platelet  Result Value Ref Range   WBC 9.4 4.0 - 10.5 K/uL   RBC 3.40 (L) 3.87 - 5.11 Mil/uL   Hemoglobin 10.7 (L) 12.0 - 15.0 g/dL   HCT 30.9 (L) 36.0 - 46.0 %   MCV 90.9 78.0 - 100.0 fl   MCHC 34.6 30.0 - 36.0 g/dL   RDW 13.2 11.5 - 15.5 %   Platelets 307.0 150.0 - 400.0 K/uL   Neutrophils Relative % 62.5 43.0 - 77.0 %   Lymphocytes Relative 24.1 12.0 - 46.0 %   Monocytes Relative 9.1 3.0 - 12.0 %   Eosinophils Relative 3.7 0.0 - 5.0 %   Basophils Relative 0.6 0.0 - 3.0 %   Neutro Abs 5.9 1.4 - 7.7 K/uL   Lymphs Abs 2.3 0.7 - 4.0 K/uL   Monocytes Absolute 0.9 0.1 - 1.0 K/uL   Eosinophils Absolute 0.3 0.0 - 0.7 K/uL   Basophils Absolute 0.1 0.0 - 0.1 K/uL     Assessment and Plan:   Essential hypertension  CKD (chronic kidney disease) stage 3, GFR 30-59 ml/min  Frozen shoulder, right - Plan: methylPREDNISolone acetate (DEPO-MEDROL) injection 80 mg  B12 deficiency anemia  Need for prophylactic vaccination against Streptococcus pneumoniae (pneumococcus) - Plan: Pneumococcal conjugate vaccine 13-valent IM  Encounter for long-term (current) use of medications - Plan: Basic metabolic panel, Hepatic function panel, CBC with Differential/Platelet  BP and CKD stable  Appears to have B12 deficiency. Will need to start supplementation - trial of oral.   adhesive capsulitis  Patient was given a systematic ROM protocol from [facility] to be done daily. Emphasized adherence. Tylenol prn for pain relief Will need RTC str and scapular stabilization to fix underlying mechanics.  Intraarticular corticosteroid injections in Adhesive Capsulitis have clearly been shown to be of benefit.   Intrarticular Shoulder Injection, R Verbal consent was obtained from the patient. Risks including infection explained and contrasted with benefits and alternatives. Patient prepped with Chloraprep and Ethyl Chloride used for anesthesia. An intraarticular shoulder injection was performed  using the posterior approach. The patient tolerated the procedure well and had decreased pain post injection. No complications. Injection: 4 cc of Lidocaine 1% and Depo-Medrol 40 mg. Needle: 22 gauge   SubAC Injection, R Verbal consent was obtained from the patient. Risks (including rare infection), benefits, and alternatives were explained. Patient prepped with Chloraprep and Ethyl Chloride used for anesthesia. The subacromial space was injected using the posterior approach. The patient tolerated the procedure well and had decreased pain post injection. No complications. Injection: 4 cc of Lidocaine 1% and Depo-Medrol  40 mg. Needle: 22 gauge   Follow-up: 6 mo, sooner if needed for shoulder  New Prescriptions   No medications on file   Orders Placed This Encounter  Procedures  . Pneumococcal conjugate vaccine 13-valent IM  . Basic metabolic panel  . Hepatic function panel  . CBC with Differential/Platelet    Signed,  Frederico Hamman T. Mahamadou Weltz, MD   Patient's Medications  New Prescriptions   No medications on file  Previous Medications   AMLODIPINE (NORVASC) 10 MG TABLET    TAKE 1 TABLET (10 MG TOTAL) BY MOUTH DAILY BEFORE BREAKFAST.   ASPIRIN 81 MG TABLET    Take 81 mg by mouth daily.   FERROUS SULFATE 325 (65 FE) MG TABLET    Take 650 mg by mouth daily with breakfast.   LISINOPRIL-HYDROCHLOROTHIAZIDE (PRINZIDE,ZESTORETIC) 20-25 MG PER TABLET    TAKE 1 TABLET BY MOUTH DAILY.   METOPROLOL (TOPROL-XL) 200 MG 24 HR TABLET    TAKE 1 TABLET (200 MG TOTAL) BY MOUTH DAILY.  Modified Medications   No medications on file  Discontinued Medications   No medications on file

## 2015-03-04 NOTE — Progress Notes (Signed)
Pre visit review using our clinic review tool, if applicable. No additional management support is needed unless otherwise documented below in the visit note. 

## 2015-03-05 ENCOUNTER — Other Ambulatory Visit (INDEPENDENT_AMBULATORY_CARE_PROVIDER_SITE_OTHER): Payer: Commercial Managed Care - HMO

## 2015-03-05 DIAGNOSIS — D509 Iron deficiency anemia, unspecified: Secondary | ICD-10-CM

## 2015-03-05 LAB — FOLATE: Folate: 8.3 ng/mL (ref >5.9)

## 2015-03-05 LAB — VITAMIN B12: Vitamin B-12: 163 pg/mL — ABNORMAL LOW (ref 211–911)

## 2015-03-05 LAB — IBC PANEL
Iron: 77 ug/dL (ref 42–145)
Saturation Ratios: 25.3 % (ref 20.0–50.0)
TRANSFERRIN: 217 mg/dL (ref 212.0–360.0)

## 2015-03-05 LAB — FERRITIN: Ferritin: 566.4 ng/mL — ABNORMAL HIGH (ref 10.0–291.0)

## 2015-03-05 MED ORDER — METHYLPREDNISOLONE ACETATE 40 MG/ML IJ SUSP
80.0000 mg | Freq: Once | INTRAMUSCULAR | Status: AC
  Administered 2015-03-04: 80 mg via INTRA_ARTICULAR

## 2015-03-10 ENCOUNTER — Other Ambulatory Visit: Payer: Self-pay | Admitting: Family Medicine

## 2015-05-04 ENCOUNTER — Other Ambulatory Visit: Payer: Self-pay | Admitting: Family Medicine

## 2015-05-04 DIAGNOSIS — E538 Deficiency of other specified B group vitamins: Secondary | ICD-10-CM

## 2015-05-12 ENCOUNTER — Other Ambulatory Visit (INDEPENDENT_AMBULATORY_CARE_PROVIDER_SITE_OTHER): Payer: Commercial Managed Care - HMO

## 2015-05-12 DIAGNOSIS — E538 Deficiency of other specified B group vitamins: Secondary | ICD-10-CM | POA: Diagnosis not present

## 2015-05-12 LAB — VITAMIN B12: VITAMIN B 12: 567 pg/mL (ref 211–911)

## 2015-05-19 ENCOUNTER — Other Ambulatory Visit: Payer: Self-pay | Admitting: Family Medicine

## 2015-05-19 ENCOUNTER — Telehealth: Payer: Self-pay

## 2015-05-19 DIAGNOSIS — K148 Other diseases of tongue: Secondary | ICD-10-CM

## 2015-05-19 NOTE — Telephone Encounter (Signed)
Pt saw dentist and pt has a spot on her tongue that dentist found the first of May; spot is size of eraser on pencil underneath pts tongue; area is slightly sore and has not gone away. Dentist wants pt to see ENT to evaluate. Pt said her ins will not pay for oral surgeon and that is another reason pt needs to see ENT. Pt request cb.

## 2015-05-19 NOTE — Telephone Encounter (Signed)
Pt left v/m; pt saw dentist and dentist wanted pt to request referral to ENT. Left v/m requesting pt to cb with reason needing referral to ENT.

## 2015-05-19 NOTE — Telephone Encounter (Signed)
done

## 2015-05-21 DIAGNOSIS — Z87891 Personal history of nicotine dependence: Secondary | ICD-10-CM | POA: Diagnosis not present

## 2015-05-21 DIAGNOSIS — K1321 Leukoplakia of oral mucosa, including tongue: Secondary | ICD-10-CM | POA: Diagnosis not present

## 2015-08-17 DIAGNOSIS — K1321 Leukoplakia of oral mucosa, including tongue: Secondary | ICD-10-CM | POA: Diagnosis not present

## 2015-08-24 ENCOUNTER — Encounter (HOSPITAL_BASED_OUTPATIENT_CLINIC_OR_DEPARTMENT_OTHER): Payer: Self-pay | Admitting: *Deleted

## 2015-08-25 ENCOUNTER — Other Ambulatory Visit: Payer: Self-pay

## 2015-08-25 ENCOUNTER — Encounter (HOSPITAL_BASED_OUTPATIENT_CLINIC_OR_DEPARTMENT_OTHER)
Admission: RE | Admit: 2015-08-25 | Discharge: 2015-08-25 | Disposition: A | Discharge status: Home / Self Care | Payer: Commercial Managed Care - HMO | Source: Ambulatory Visit | Attending: Otolaryngology | Admitting: Otolaryngology

## 2015-08-25 DIAGNOSIS — Z01818 Encounter for other preprocedural examination: Secondary | ICD-10-CM | POA: Diagnosis not present

## 2015-08-25 DIAGNOSIS — I10 Essential (primary) hypertension: Secondary | ICD-10-CM | POA: Diagnosis not present

## 2015-08-25 DIAGNOSIS — Z87891 Personal history of nicotine dependence: Secondary | ICD-10-CM | POA: Diagnosis not present

## 2015-08-25 DIAGNOSIS — K1230 Oral mucositis (ulcerative), unspecified: Secondary | ICD-10-CM | POA: Diagnosis not present

## 2015-08-25 LAB — BASIC METABOLIC PANEL
ANION GAP: 10 (ref 5–15)
BUN: 29 mg/dL — ABNORMAL HIGH (ref 6–20)
CO2: 23 mmol/L (ref 22–32)
Calcium: 9.3 mg/dL (ref 8.9–10.3)
Chloride: 98 mmol/L — ABNORMAL LOW (ref 101–111)
Creatinine, Ser: 1.79 mg/dL — ABNORMAL HIGH (ref 0.44–1.00)
GFR calc Af Amer: 32 mL/min — ABNORMAL LOW (ref >60)
GFR calc non Af Amer: 28 mL/min — ABNORMAL LOW (ref >60)
GLUCOSE: 87 mg/dL (ref 65–99)
POTASSIUM: 5 mmol/L (ref 3.5–5.1)
Sodium: 131 mmol/L — ABNORMAL LOW (ref 135–145)

## 2015-08-25 NOTE — Progress Notes (Signed)
Dr. Lissa Hoard reviewed labs, history and notes - would like [place] to check I stat day of surgery.

## 2015-08-26 ENCOUNTER — Ambulatory Visit: Payer: Self-pay | Admitting: Otolaryngology

## 2015-08-26 NOTE — H&P (Signed)
  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 Discussed  No new complaints. She is still very concerned about this change in her tongue. On exam, there is no significant change in the left posterolateral tongue leukoplakia. Recommend we go ahead and proceed with biopsy and laser excision under anesthesia at the outpatient center. Reason For Visit  Recheck of previous tongue lesion. Allergies  PredniSONE; Joint Pain,Joint Stiffness. Current Meds  Lisinopril-Hydrochlorothiazide 20-25 MG Oral Tablet;; RPT AmLODIPine Besylate 10 MG Oral Tablet;; RPT Metoprolol Succinate ER 200 MG Oral Tablet Extended Release 24 Hour;; RPT. Active Problems  Former smoker   [phone]) Hypertension   (401.9) (I10) Oral leukoplakia   (528.6) (K13.21). [place]  [facility]. Family Hx  Family history of essential hypertension: Mother,Father (V17.49) (Z82.49) Family history of osteoporosis: Mother (V17.81) (Z66.62). Personal Hx  Daily caffeine consumption, 2-3 servings a day Former smoker [phone]) Never a smoker Secondhand smoke exposure (V15.89) (Z77.22). Signature  Electronically signed by : Izora Gala  M.D.; 08/17/2015 1:49 PM EST.

## 2015-08-31 ENCOUNTER — Ambulatory Visit (HOSPITAL_BASED_OUTPATIENT_CLINIC_OR_DEPARTMENT_OTHER): Payer: Commercial Managed Care - HMO | Admitting: Anesthesiology

## 2015-08-31 ENCOUNTER — Ambulatory Visit (HOSPITAL_BASED_OUTPATIENT_CLINIC_OR_DEPARTMENT_OTHER)
Admission: RE | Admit: 2015-08-31 | Discharge: 2015-08-31 | Disposition: A | Discharge status: Home / Self Care | Payer: Commercial Managed Care - HMO | Source: Ambulatory Visit | Attending: Otolaryngology | Admitting: Otolaryngology

## 2015-08-31 ENCOUNTER — Encounter (HOSPITAL_BASED_OUTPATIENT_CLINIC_OR_DEPARTMENT_OTHER): Payer: Self-pay | Admitting: Anesthesiology

## 2015-08-31 ENCOUNTER — Encounter (HOSPITAL_BASED_OUTPATIENT_CLINIC_OR_DEPARTMENT_OTHER)
Admission: RE | Disposition: A | Discharge status: Home / Self Care | Payer: Self-pay | Source: Ambulatory Visit | Attending: Otolaryngology

## 2015-08-31 DIAGNOSIS — I10 Essential (primary) hypertension: Secondary | ICD-10-CM | POA: Diagnosis not present

## 2015-08-31 DIAGNOSIS — K1230 Oral mucositis (ulcerative), unspecified: Secondary | ICD-10-CM | POA: Diagnosis not present

## 2015-08-31 DIAGNOSIS — Z87891 Personal history of nicotine dependence: Secondary | ICD-10-CM | POA: Diagnosis not present

## 2015-08-31 DIAGNOSIS — K148 Other diseases of tongue: Secondary | ICD-10-CM | POA: Diagnosis not present

## 2015-08-31 HISTORY — PX: EXCISION OF TONGUE LESION WITH LASER: SHX5823

## 2015-08-31 SURGERY — EXCISION, LESION, TONGUE, USING LASER
Anesthesia: General | Site: Mouth

## 2015-08-31 MED ORDER — FENTANYL CITRATE (PF) 100 MCG/2ML IJ SOLN
INTRAMUSCULAR | Status: AC
  Filled 2015-08-31: qty 4

## 2015-08-31 MED ORDER — OXYCODONE HCL 5 MG/5ML PO SOLN: 5.0000 mg | Freq: Once | ORAL | Status: DC | PRN

## 2015-08-31 MED ORDER — ONDANSETRON HCL 4 MG/2ML IJ SOLN
INTRAMUSCULAR | Status: AC
  Filled 2015-08-31: qty 2

## 2015-08-31 MED ORDER — BACITRACIN ZINC 500 UNIT/GM EX OINT
TOPICAL_OINTMENT | CUTANEOUS | Status: AC
  Filled 2015-08-31: qty 0.9

## 2015-08-31 MED ORDER — MIDAZOLAM HCL 2 MG/2ML IJ SOLN
1.0000 mg | INTRAMUSCULAR | Status: DC | PRN
  Administered 2015-08-31: 2 mg via INTRAVENOUS

## 2015-08-31 MED ORDER — GLYCOPYRROLATE 0.2 MG/ML IJ SOLN
INTRAMUSCULAR | Status: AC
  Filled 2015-08-31: qty 1

## 2015-08-31 MED ORDER — LACTATED RINGERS IV SOLN
INTRAVENOUS | Status: DC
  Administered 2015-08-31 (×2): via INTRAVENOUS

## 2015-08-31 MED ORDER — METHYLENE BLUE 1 % INJ SOLN
INTRAMUSCULAR | Status: AC
  Filled 2015-08-31: qty 10

## 2015-08-31 MED ORDER — SUCCINYLCHOLINE CHLORIDE 20 MG/ML IJ SOLN
INTRAMUSCULAR | Status: AC
  Filled 2015-08-31: qty 1

## 2015-08-31 MED ORDER — MIDAZOLAM HCL 2 MG/2ML IJ SOLN
INTRAMUSCULAR | Status: AC
  Filled 2015-08-31: qty 4

## 2015-08-31 MED ORDER — HYDROMORPHONE HCL 1 MG/ML IJ SOLN: 0.2500 mg | INTRAMUSCULAR | Status: DC | PRN

## 2015-08-31 MED ORDER — OXYMETAZOLINE HCL 0.05 % NA SOLN
NASAL | Status: AC
  Filled 2015-08-31: qty 15

## 2015-08-31 MED ORDER — MEPERIDINE HCL 25 MG/ML IJ SOLN: 6.2500 mg | INTRAMUSCULAR | Status: DC | PRN

## 2015-08-31 MED ORDER — BACITRACIN 500 UNIT/GM EX OINT
TOPICAL_OINTMENT | CUTANEOUS | Status: DC | PRN
  Administered 2015-08-31: 1 via TOPICAL

## 2015-08-31 MED ORDER — ONDANSETRON 4 MG PO TBDP: 4.0000 mg | ORAL_TABLET | Freq: Three times a day (TID) | ORAL | Status: DC | PRN

## 2015-08-31 MED ORDER — EPHEDRINE SULFATE 50 MG/ML IJ SOLN
INTRAMUSCULAR | Status: DC | PRN
  Administered 2015-08-31: 10 mg via INTRAVENOUS

## 2015-08-31 MED ORDER — DEXAMETHASONE SODIUM PHOSPHATE 4 MG/ML IJ SOLN
INTRAMUSCULAR | Status: DC | PRN
  Administered 2015-08-31: 10 mg via INTRAVENOUS

## 2015-08-31 MED ORDER — LIDOCAINE-EPINEPHRINE 1 %-1:100000 IJ SOLN
INTRAMUSCULAR | Status: DC | PRN
  Administered 2015-08-31: 4 mL

## 2015-08-31 MED ORDER — EPINEPHRINE HCL 1 MG/ML IJ SOLN
INTRAMUSCULAR | Status: AC
  Filled 2015-08-31: qty 1

## 2015-08-31 MED ORDER — FENTANYL CITRATE (PF) 100 MCG/2ML IJ SOLN
50.0000 ug | INTRAMUSCULAR | Status: DC | PRN
  Administered 2015-08-31: 100 ug via INTRAVENOUS

## 2015-08-31 MED ORDER — LIDOCAINE HCL (CARDIAC) 20 MG/ML IV SOLN
INTRAVENOUS | Status: DC | PRN
  Administered 2015-08-31: 80 mg via INTRAVENOUS

## 2015-08-31 MED ORDER — PROPOFOL 10 MG/ML IV BOLUS
INTRAVENOUS | Status: AC
  Filled 2015-08-31: qty 20

## 2015-08-31 MED ORDER — EPHEDRINE SULFATE 50 MG/ML IJ SOLN
INTRAMUSCULAR | Status: AC
  Filled 2015-08-31: qty 1

## 2015-08-31 MED ORDER — HYDROCODONE-ACETAMINOPHEN 7.5-325 MG PO TABS: 1.0000 | ORAL_TABLET | Freq: Four times a day (QID) | ORAL | Status: DC | PRN

## 2015-08-31 MED ORDER — DEXAMETHASONE SODIUM PHOSPHATE 10 MG/ML IJ SOLN
INTRAMUSCULAR | Status: AC
  Filled 2015-08-31: qty 1

## 2015-08-31 MED ORDER — PROPOFOL 10 MG/ML IV BOLUS
INTRAVENOUS | Status: DC | PRN
  Administered 2015-08-31: 200 mg via INTRAVENOUS

## 2015-08-31 MED ORDER — LIDOCAINE-EPINEPHRINE 1 %-1:100000 IJ SOLN
INTRAMUSCULAR | Status: AC
  Filled 2015-08-31: qty 1

## 2015-08-31 MED ORDER — OXYCODONE HCL 5 MG PO TABS: 5.0000 mg | ORAL_TABLET | Freq: Once | ORAL | Status: DC | PRN

## 2015-08-31 MED ORDER — SCOPOLAMINE 1 MG/3DAYS TD PT72: 1.0000 | MEDICATED_PATCH | Freq: Once | TRANSDERMAL | Status: DC | PRN

## 2015-08-31 MED ORDER — LIDOCAINE HCL (CARDIAC) 20 MG/ML IV SOLN
INTRAVENOUS | Status: AC
  Filled 2015-08-31: qty 5

## 2015-08-31 MED ORDER — SUCCINYLCHOLINE CHLORIDE 20 MG/ML IJ SOLN
INTRAMUSCULAR | Status: DC | PRN
  Administered 2015-08-31: 100 mg via INTRAVENOUS

## 2015-08-31 MED ORDER — GLYCOPYRROLATE 0.2 MG/ML IJ SOLN
0.2000 mg | Freq: Once | INTRAMUSCULAR | Status: AC | PRN
  Administered 2015-08-31: 0.2 mg via INTRAVENOUS

## 2015-08-31 SURGICAL SUPPLY — 38 items
BANDAGE EYE OVAL (MISCELLANEOUS) ×2 IMPLANT
CANISTER SUCT 1200ML W/VALVE (MISCELLANEOUS) ×3 IMPLANT
COAGULATOR SUCT 6 FR SWTCH (ELECTROSURGICAL)
COAGULATOR SUCT SWTCH 10FR 6 (ELECTROSURGICAL) IMPLANT
COVER MAYO STAND STRL (DRAPES) ×3 IMPLANT
DEPRESSOR TONGUE BLADE STERILE (MISCELLANEOUS) ×3 IMPLANT
ELECT COATED BLADE 2.86 ST (ELECTRODE) IMPLANT
ELECT REM PT RETURN 9FT ADLT (ELECTROSURGICAL)
ELECTRODE REM PT RTRN 9FT ADLT (ELECTROSURGICAL) IMPLANT
FILTER 7/8 IN (FILTER) ×3 IMPLANT
GLOVE BIOGEL PI IND STRL 7.0 (GLOVE) ×1 IMPLANT
GLOVE BIOGEL PI INDICATOR 7.0 (GLOVE) ×2
GLOVE ECLIPSE 6.5 STRL STRAW (GLOVE) ×2 IMPLANT
GLOVE ECLIPSE 7.5 STRL STRAW (GLOVE) ×3 IMPLANT
GOWN STRL REUS W/ TWL LRG LVL3 (GOWN DISPOSABLE) IMPLANT
GOWN STRL REUS W/TWL LRG LVL3 (GOWN DISPOSABLE) ×6
MARKER SKIN DUAL TIP RULER LAB (MISCELLANEOUS) IMPLANT
NDL HYPO 30GX1 BEV (NEEDLE) IMPLANT
NDL PRECISIONGLIDE 27X1.5 (NEEDLE) ×1 IMPLANT
NEEDLE HYPO 30GX1 BEV (NEEDLE) IMPLANT
NEEDLE PRECISIONGLIDE 27X1.5 (NEEDLE) ×3 IMPLANT
NS IRRIG 1000ML POUR BTL (IV SOLUTION) ×3 IMPLANT
PACK BASIN DAY SURGERY FS (CUSTOM PROCEDURE TRAY) ×2 IMPLANT
PATTIES SURGICAL .5 X3 (DISPOSABLE) IMPLANT
PENCIL FOOT CONTROL (ELECTRODE) IMPLANT
REDUCTION FITTING 1/4 IN (FILTER) IMPLANT
SHEET MEDIUM DRAPE 40X70 STRL (DRAPES) ×3 IMPLANT
SLEEVE SCD COMPRESS KNEE MED (MISCELLANEOUS) ×2 IMPLANT
SPONGE GAUZE 4X4 12PLY STER LF (GAUZE/BANDAGES/DRESSINGS) IMPLANT
SUT SILK 3 0 PS 1 (SUTURE) IMPLANT
SUT VIC AB 3-0 SH 27 (SUTURE) ×3
SUT VIC AB 3-0 SH 27X BRD (SUTURE) IMPLANT
SYR BULB 3OZ (MISCELLANEOUS) ×2 IMPLANT
SYR CONTROL 10ML LL (SYRINGE) ×3 IMPLANT
TOWEL OR 17X24 6PK STRL BLUE (TOWEL DISPOSABLE) ×3 IMPLANT
TUBE CONNECTING 20'X1/4 (TUBING) ×2
TUBE CONNECTING 20X1/4 (TUBING) ×4 IMPLANT
YANKAUER SUCT BULB TIP NO VENT (SUCTIONS) ×2 IMPLANT

## 2015-08-31 NOTE — H&P (View-Only) (Signed)
  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 Discussed  No new complaints. She is still very concerned about this change in her tongue. On exam, there is no significant change in the left posterolateral tongue leukoplakia. Recommend we go ahead and proceed with biopsy and laser excision under anesthesia at the outpatient center. Reason For Visit  Recheck of previous tongue lesion. Allergies  PredniSONE; Joint Pain,Joint Stiffness. Current Meds  Lisinopril-Hydrochlorothiazide 20-25 MG Oral Tablet;; RPT AmLODIPine Besylate 10 MG Oral Tablet;; RPT Metoprolol Succinate ER 200 MG Oral Tablet Extended Release 24 Hour;; RPT. Active Problems  Former smoker   [phone] Hypertension   (401.9) (I10) Oral leukoplakia   (528.6) (K13.21). [place]  [facility]. Family Hx  Family history of essential hypertension: Mother,Father (V17.49) (Z82.49) Family history of osteoporosis: Mother (V17.81) (Z34.62). Personal Hx  Daily caffeine consumption, 2-3 servings a day Former smoker [phone] Never a smoker Secondhand smoke exposure (V15.89) (Z77.22). Signature  Electronically signed by : Izora Gala  M.D.; 08/17/2015 1:49 PM EST.

## 2015-08-31 NOTE — Anesthesia Procedure Notes (Signed)
Procedure Name: Intubation Date/Time: 08/31/2015 7:39 AM Performed by: Lyndee Leo Pre-anesthesia Checklist: Patient identified, Emergency Drugs available, Suction available and Patient being monitored Patient Re-evaluated:Patient Re-evaluated prior to inductionOxygen Delivery Method: Circle System Utilized Preoxygenation: Pre-oxygenation with 100% oxygen Intubation Type: IV induction Ventilation: Mask ventilation without difficulty Laryngoscope Size: Mac and 3 Tube type: MLT Tube size: 6.0 mm Number of attempts: 1 Airway Equipment and Method: Stylet and Oral airway Placement Confirmation: ETT inserted through vocal cords under direct vision,  positive ETCO2 and breath sounds checked- equal and bilateral Tube secured with: Tape Dental Injury: Teeth and Oropharynx as per pre-operative assessment

## 2015-08-31 NOTE — Anesthesia Preprocedure Evaluation (Signed)
Anesthesia Evaluation  Patient identified by MRN, date of birth, ID band Patient awake    Reviewed: Allergy & Precautions, NPO status , Patient's Chart, lab work & pertinent test results  Airway Mallampati: I  TM Distance: >3 FB Neck ROM: Full    Dental  (+) Teeth Intact, Dental Advisory Given   Pulmonary former smoker,    breath sounds clear to auscultation       Cardiovascular hypertension, Pt. on medications and Pt. on home beta blockers  Rhythm:Regular Rate:Normal     Neuro/Psych    GI/Hepatic   Endo/Other    Renal/GU Renal disease     Musculoskeletal   Abdominal   Peds  Hematology   Anesthesia Other Findings   Reproductive/Obstetrics                             Anesthesia Physical Anesthesia Plan  ASA: II  Anesthesia Plan: General   Post-op Pain Management:    Induction: Intravenous  Airway Management Planned: Oral ETT  Additional Equipment:   Intra-op Plan:   Post-operative Plan: Extubation in OR  Informed Consent: I have reviewed the patients History and Physical, chart, labs and discussed the procedure including the risks, benefits and alternatives for the proposed anesthesia with the patient or authorized representative who has indicated his/her understanding and acceptance.   Dental advisory given  Plan Discussed with: CRNA, Anesthesiologist and Surgeon  Anesthesia Plan Comments:         Anesthesia Quick Evaluation

## 2015-08-31 NOTE — Discharge Instructions (Signed)

## 2015-08-31 NOTE — Op Note (Signed)
OPERATIVE REPORT  DATE OF SURGERY: 08/31/2015  PATIENT:  Taylor Bush,  69 y.o. female  PRE-OPERATIVE DIAGNOSIS:  TONGUE LESION   POST-OPERATIVE DIAGNOSIS:  TONGUE LESION   PROCEDURE:  Procedure(s): BIOPSY AND CO2 LASER OF TONGUE LESION   SURGEON:  Beckie Salts, MD  ASSISTANTS: none  ANESTHESIA:   General   EBL:  3 ml  DRAINS: none  LOCAL MEDICATIONS USED:  1% xylocaine with epinephrine  SPECIMEN:  Left oral lateral tongue biopsy  COUNTS:  Correct  PROCEDURE DETAILS: The patient was taken to the operating room and placed on the operating table in the supine position. Following induction of general endotracheal anesthesia, the patient was draped in a standard fashion. Saline soaked towels were placed around the face. The laser safe endotracheal tube was used throughout the case. A bite-block was used to keep the mouth open. A towel clamp was used to retract the tip of the tongue and the lesion on the left side of the tongue was identified. 1% Xylocaine with epinephrine was infiltrated around the lesion. The total dimension was about 0.5 cm x 1 cm. A biopsy was taken from the thickest part of the lesion using a large endoscopic biopsy forcep. The carbon dioxide laser was used on a setting of 3 W continuous power with the handpiece to obliterate the remainder of the lesion. The biopsy defect was reapproximated with 3-0 Vicryl suture, 2 interrupted inverted sutures. The patient was awakened extubated and transferred to recovery in stable condition.    PATIENT DISPOSITION:  To PACU, stable

## 2015-08-31 NOTE — Transfer of Care (Signed)
Immediate Anesthesia Transfer of Care Note  Patient: Taylor Bush  Procedure(s) Performed: Procedure(s): BIOPSY AND CO2 LASER OF TONGUE LESION  (N/A)  Patient Location: PACU  Anesthesia Type:General  Level of Consciousness: awake, sedated and patient cooperative  Airway & Oxygen Therapy: Patient Spontanous Breathing and Patient connected to face mask oxygen  Post-op Assessment: Report given to RN and Post -op Vital signs reviewed and stable  Post vital signs: Reviewed and stable  Last Vitals:  Filed Vitals:   08/31/15 0645  BP:   Pulse: 53  Temp: 36.5 C  Resp:     Complications: No apparent anesthesia complications

## 2015-08-31 NOTE — Anesthesia Postprocedure Evaluation (Signed)
  Anesthesia Post-op Note  Patient: Taylor Bush  Procedure(s) Performed: Procedure(s): BIOPSY AND CO2 LASER OF TONGUE LESION  (N/A)  Patient Location: PACU  Anesthesia Type: General   Level of Consciousness: awake, alert  and oriented  Airway and Oxygen Therapy: Patient Spontanous Breathing  Post-op Pain: none  Post-op Assessment: Post-op Vital signs reviewed  Post-op Vital Signs: Reviewed  Last Vitals:  Filed Vitals:   08/31/15 0901  BP: 149/64  Pulse: 62  Temp: 36.1 C  Resp: 16    Complications: No apparent anesthesia complications

## 2015-08-31 NOTE — Interval H&P Note (Signed)
History and Physical Interval Note:  08/31/2015 7:26 AM  [name]  has presented today for surgery, with the diagnosis of TONGUE LESION   The various methods of treatment have been discussed with the patient and family. After consideration of risks, benefits and other options for treatment, the patient has consented to  Procedure(s): BIOPSY AND CO2 LASER OF TONGUE LESION  (N/A) as a surgical intervention .  The patient's history has been reviewed, patient examined, no change in status, stable for surgery.  I have reviewed the patient's chart and labs.  Questions were answered to the patient's satisfaction.     Terence Googe

## 2015-09-01 ENCOUNTER — Encounter (HOSPITAL_BASED_OUTPATIENT_CLINIC_OR_DEPARTMENT_OTHER): Payer: Self-pay | Admitting: Otolaryngology

## 2015-12-10 DIAGNOSIS — K1321 Leukoplakia of oral mucosa, including tongue: Secondary | ICD-10-CM | POA: Diagnosis not present

## 2016-01-27 ENCOUNTER — Telehealth: Payer: Self-pay | Admitting: *Deleted

## 2016-01-27 MED ORDER — LISINOPRIL-HYDROCHLOROTHIAZIDE 20-25 MG PO TABS: 1.0000 | ORAL_TABLET | Freq: Every day | ORAL | Status: DC

## 2016-01-27 MED ORDER — AMLODIPINE BESYLATE 10 MG PO TABS: ORAL_TABLET | ORAL | Status: DC

## 2016-01-27 MED ORDER — METOPROLOL SUCCINATE ER 200 MG PO TB24: ORAL_TABLET | ORAL | Status: DC

## 2016-01-27 NOTE — Telephone Encounter (Signed)
Please call and schedule CPE with fasting labs prior for Dr. Lorelei Pont for end of April.

## 2016-01-28 NOTE — Telephone Encounter (Signed)
Lab 4/5 cpx   4/10 Pt aware  Please close

## 2016-02-03 DIAGNOSIS — Z1231 Encounter for screening mammogram for malignant neoplasm of breast: Secondary | ICD-10-CM | POA: Diagnosis not present

## 2016-02-10 ENCOUNTER — Other Ambulatory Visit (INDEPENDENT_AMBULATORY_CARE_PROVIDER_SITE_OTHER): Payer: Commercial Managed Care - HMO

## 2016-02-10 DIAGNOSIS — E785 Hyperlipidemia, unspecified: Secondary | ICD-10-CM

## 2016-02-10 DIAGNOSIS — R5383 Other fatigue: Secondary | ICD-10-CM

## 2016-02-10 DIAGNOSIS — E538 Deficiency of other specified B group vitamins: Secondary | ICD-10-CM | POA: Diagnosis not present

## 2016-02-10 LAB — BASIC METABOLIC PANEL
BUN: 25 mg/dL — AB (ref 6–23)
CO2: 26 mEq/L (ref 19–32)
CREATININE: 1.39 mg/dL — AB (ref 0.40–1.20)
Calcium: 9.2 mg/dL (ref 8.4–10.5)
Chloride: 101 mEq/L (ref 96–112)
GFR: 39.89 mL/min — AB (ref >60.00)
GLUCOSE: 105 mg/dL — AB (ref 70–99)
POTASSIUM: 4.8 meq/L (ref 3.5–5.1)
Sodium: 132 mEq/L — ABNORMAL LOW (ref 135–145)

## 2016-02-10 LAB — LIPID PANEL
CHOL/HDL RATIO: 4
CHOLESTEROL: 221 mg/dL — AB (ref 0–200)
HDL: 53.8 mg/dL (ref >39.00)
LDL CALC: 140 mg/dL — AB (ref 0–99)
NONHDL: 167.23
Triglycerides: 135 mg/dL (ref 0.0–149.0)
VLDL: 27 mg/dL (ref 0.0–40.0)

## 2016-02-10 LAB — CBC WITH DIFFERENTIAL/PLATELET
BASOS ABS: 0 10*3/uL (ref 0.0–0.1)
Basophils Relative: 0.4 % (ref 0.0–3.0)
EOS ABS: 0.4 10*3/uL (ref 0.0–0.7)
Eosinophils Relative: 4.1 % (ref 0.0–5.0)
HEMATOCRIT: 29.2 % — AB (ref 36.0–46.0)
HEMOGLOBIN: 9.8 g/dL — AB (ref 12.0–15.0)
LYMPHS PCT: 18.3 % (ref 12.0–46.0)
Lymphs Abs: 1.7 10*3/uL (ref 0.7–4.0)
MCHC: 33.5 g/dL (ref 30.0–36.0)
MCV: 90.2 fl (ref 78.0–100.0)
Monocytes Absolute: 0.8 10*3/uL (ref 0.1–1.0)
Monocytes Relative: 8.7 % (ref 3.0–12.0)
Neutro Abs: 6.3 10*3/uL (ref 1.4–7.7)
Neutrophils Relative %: 68.5 % (ref 43.0–77.0)
Platelets: 319 10*3/uL (ref 150.0–400.0)
RBC: 3.23 Mil/uL — AB (ref 3.87–5.11)
RDW: 14.6 % (ref 11.5–15.5)
WBC: 9.3 10*3/uL (ref 4.0–10.5)

## 2016-02-10 LAB — HEPATIC FUNCTION PANEL
ALK PHOS: 56 U/L (ref 39–117)
ALT: 9 U/L (ref 0–35)
AST: 13 U/L (ref 0–37)
Albumin: 4.1 g/dL (ref 3.5–5.2)
BILIRUBIN DIRECT: 0.1 mg/dL (ref 0.0–0.3)
TOTAL PROTEIN: 6.7 g/dL (ref 6.0–8.3)
Total Bilirubin: 0.4 mg/dL (ref 0.2–1.2)

## 2016-02-10 LAB — TSH: TSH: 1.98 u[IU]/mL (ref 0.35–4.50)

## 2016-02-10 LAB — VITAMIN B12: Vitamin B-12: 682 pg/mL (ref 211–911)

## 2016-02-11 ENCOUNTER — Encounter: Payer: Self-pay | Admitting: Family Medicine

## 2016-02-15 ENCOUNTER — Ambulatory Visit (INDEPENDENT_AMBULATORY_CARE_PROVIDER_SITE_OTHER): Payer: Commercial Managed Care - HMO | Admitting: Family Medicine

## 2016-02-15 ENCOUNTER — Encounter: Payer: Self-pay | Admitting: Family Medicine

## 2016-02-15 VITALS — BP 120/70 | HR 66 | Temp 98.2°F | Ht 61.25 in | Wt 153.2 lb

## 2016-02-15 DIAGNOSIS — Z0000 Encounter for general adult medical examination without abnormal findings: Secondary | ICD-10-CM

## 2016-02-15 DIAGNOSIS — M8580 Other specified disorders of bone density and structure, unspecified site: Secondary | ICD-10-CM

## 2016-02-15 DIAGNOSIS — D509 Iron deficiency anemia, unspecified: Secondary | ICD-10-CM

## 2016-02-15 DIAGNOSIS — N183 Chronic kidney disease, stage 3 unspecified: Secondary | ICD-10-CM

## 2016-02-15 NOTE — Progress Notes (Signed)
Dr. Frederico Hamman T. Jasmene Goswami, MD, [facility] [place], [address] Phone: [phone] Fax: [phone]  02/15/2016  Patient: Taylor Bush, MRN: [id number], DOB: [birth year], 70 y.o.  Primary Physician:  Owens Loffler, MD   Chief Complaint  Patient presents with  . Annual Exam    Medicare Wellness   Subjective:   Taylor Bush is a 70 y.o. pleasant patient who presents for a medicare wellness examination:  Health Maintenance Summary Reviewed and updated, unless pt declines services.  Tobacco History Reviewed. Non-smoker Alcohol: No concerns, no excessive use Exercise Habits: rare activity, rec at least 30 mins 5 times a week STD concerns: none Drug Use: None Birth control method: n/a Menses regular: n/a Lumps or breast concerns: no Breast Cancer Family History: no  DEXA  Husband and father passed away this year.  Passed out just right after the funeral - felt like she was going to pass out then.    Health Maintenance  Topic Date Due  . Hepatitis C Screening  1945/12/06  . TETANUS/TDAP  07/21/1965  . DEXA SCAN  07/22/2011  . INFLUENZA VACCINE  06/07/2016  . COLONOSCOPY  04/13/2017  . MAMMOGRAM  02/02/2018  . ZOSTAVAX  Addressed  . PNA vac Low Risk Adult  Completed   Immunization History  Administered Date(s) Administered  . Influenza-Unspecified 07/08/2014, 08/08/2015, 08/08/2015  . Pneumococcal Conjugate-13 03/04/2015  . Pneumococcal Polysaccharide-23 09/05/2012    Patient Active Problem List   Diagnosis Date Noted  . CKD (chronic kidney disease) stage 3, GFR 30-59 ml/min 11/29/2013  . Avascular necrosis of left femoral head ([place]) 12/16/2011  . Anemia, iron deficiency 12/16/2011  . Hx of total hip arthroplasty 12/15/2011  . AVN (avascular necrosis of bone) Right Hip 10/04/2011  . ATHEROSCLEROSIS W /INT CLAUDICATION 06/17/2009  . BACK PAIN, LUMBAR, WITH RADICULOPATHY 06/03/2009  . HYPERLIPIDEMIA  12/15/2008  . Essential hypertension 12/15/2008  . UNSPECIFIED OPEN-ANGLE GLAUCOMA 10/27/2008  . UNSPECIFIED CATARACT 10/27/2008   Past Medical History  Diagnosis Date  . HTN (hypertension)   . HLD (hyperlipidemia)   . Lumbar back pain     with radiculopathy  . Cataract   . Open-angle glaucoma   . PAD (peripheral artery disease) ([place])     with intermittent claudication, ABI 0.72 on Rt and 0.73 on L when assessed in 2010.   Marland Kitchen Arthritis     bilateral hips   . Hx of total hip arthroplasty 12/15/2011  . Anemia   . Blood transfusion     11/12   . CKD (chronic kidney disease) stage 3, GFR 30-59 ml/min 11/29/2013   Past Surgical History  Procedure Laterality Date  . Cataract surgery  2/10  . Abnormal vascular studies  7/10    ABI's 70, R SFA > 50%^ blockage   . Other surgical history      ganglion cyst removed left wrist   . Total hip arthroplasty  10/03/2011    Procedure: TOTAL HIP ARTHROPLASTY;  Surgeon: Dione Plover Aluisio;  Location: [facility];  Service: Orthopedics;  Laterality: Right;  . Total hip arthroplasty  12/21/2011    Procedure: TOTAL HIP ARTHROPLASTY;  Surgeon: Gearlean Alf, MD;  Location: [facility];  Service: Orthopedics;  Laterality: Left;  . Tubal ligation    . Excision of tongue lesion with laser N/A 08/31/2015    Procedure: BIOPSY AND CO2 LASER OF TONGUE LESION ;  Surgeon: Izora Gala, MD;  Location: [facility];  Service: ENT;  Laterality: N/A;   Social History   Social History  . Marital Status: Married    Spouse Name: N/A  . Number of Children: N/A  . Years of Education: N/A   Occupational History  . Not on file.   Social History Main Topics  . Smoking status: Former Smoker    Quit date: 11/07/2008  . Smokeless tobacco: Never Used  . Alcohol Use: No  . Drug Use: No  . Sexual Activity: Not on file   Other Topics Concern  . 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    Family History    Problem Relation Age of Onset  . Heart attack Father     74s  . Stroke Neg Hx     also no PAD or aneurysm  . Colon cancer Neg Hx   . Esophageal cancer Neg Hx   . Stomach cancer Neg Hx   . Rectal cancer Neg Hx    Allergies  Allergen Reactions  . Statins Rash    "hands broke out"    Medication list has been reviewed and updated.   General: Denies fever, chills, sweats. No significant weight loss. SOME FATIGUE, CLOSE TO PASSING OUT AFTER HUSBAND DIED Eyes: Denies blurring,significant itching ENT: Denies earache, sore throat, and hoarseness.  Cardiovascular: Denies chest pains, palpitations, dyspnea on exertion,  Respiratory: Denies cough, dyspnea at rest,wheeezing Breast: no concerns about lumps GI: Denies nausea, vomiting, diarrhea, constipation, change in bowel habits, abdominal pain, melena, hematochezia GU: Denies dysuria, hematuria, urinary hesitancy, nocturia, denies STD risk, no concerns about discharge Musculoskeletal: Denies back pain, joint pain Derm: Denies rash, itching Neuro: Denies  paresthesias, frequent falls, frequent headaches Psych: Denies depression, anxiety Endocrine: Denies cold intolerance, heat intolerance, polydipsia Heme: Denies enlarged lymph nodes Allergy: No hayfever  Objective:   BP 120/70 mmHg  Pulse 66  Temp(Src) 98.2 F (36.8 C) (Oral)  Ht 5' 1.25" (1.556 m)  Wt 153 lb 4 oz (69.514 kg)  BMI 28.71 kg/m2  The patient completed a fall screen and PHQ-2 and PHQ-9 if necessary, which is documented in the EHR. The CMA/LPN/RN who assisted the patient verbally completed with them and documented results in [facility] EHR.   Hearing Screening   Method: Audiometry   '125Hz'  '250Hz'  '500Hz'  '1000Hz'  '2000Hz'  '4000Hz'  '8000Hz'   Right ear:     20 20   Left ear:   40 40 20 40   Vision Screening Comments: Wears Glasses-Sees [facility] [place] for yearly eye exams.  GEN: well developed, well nourished, no acute distress Eyes: conjunctiva and lids normal, PERRLA,  EOMI ENT: TM clear, nares clear, oral exam WNL Neck: supple, no lymphadenopathy, no thyromegaly, no JVD Pulm: clear to auscultation and percussion, respiratory effort normal CV: regular rate and rhythm, S1-S2, no murmur, rub or gallop, no bruits Chest: no scars, masses, no lumps BREAST: breast exam declined GI: soft, non-tender; no hepatosplenomegaly, masses; active bowel sounds all quadrants GU: GU exam declined Lymph: no cervical, axillary or inguinal adenopathy MSK: gait normal, muscle tone and strength WNL, no joint swelling, effusions, discoloration, crepitus  SKIN: clear, good turgor, color WNL, no rashes, lesions, or ulcerations Neuro: normal mental status, normal strength, sensation, and motion Psych: alert; oriented to person, place and time, normally interactive and not anxious or depressed in appearance.   All labs reviewed with patient. Lipids:    Component Value Date/Time   CHOL 221* 02/10/2016  1502   TRIG 135.0 02/10/2016 1502   HDL 53.80 02/10/2016 1502   LDLDIRECT 167.5 11/28/2013 1148   VLDL 27.0 02/10/2016 1502   CHOLHDL 4 02/10/2016 1502   CBC: CBC Latest Ref Rng 02/10/2016 03/04/2015 11/28/2013  WBC 4.0 - 10.5 K/uL 9.3 9.4 10.9(H)  Hemoglobin 12.0 - 15.0 g/dL 9.8(L) 10.7(L) 10.5(L)  Hematocrit 36.0 - 46.0 % 29.2(L) 30.9(L) 30.5(L)  Platelets 150.0 - 400.0 K/uL 319.0 307.0 383.3    Basic Metabolic Panel:    Component Value Date/Time   NA 132* 02/10/2016 1502   K 4.8 02/10/2016 1502   CL 101 02/10/2016 1502   CO2 26 02/10/2016 1502   BUN 25* 02/10/2016 1502   CREATININE 1.39* 02/10/2016 1502   GLUCOSE 105* 02/10/2016 1502   CALCIUM 9.2 02/10/2016 1502   Hepatic Function Latest Ref Rng 02/10/2016 03/04/2015 11/28/2013  Total Protein 6.0 - 8.3 g/dL 6.7 7.0 7.5  Albumin 3.5 - 5.2 g/dL 4.1 4.2 4.0  AST 0 - 37 U/L '13 15 15  ' ALT 0 - 35 U/L '9 10 15  ' Alk Phosphatase 39 - 117 U/L 56 58 70  Total Bilirubin 0.2 - 1.2 mg/dL 0.4 0.4 0.4  Bilirubin, Direct 0.0 - 0.3  mg/dL 0.1 0.1 0.0    Lab Results  Component Value Date   TSH 1.98 02/10/2016    No results found.  Assessment and Plan:   Healthcare maintenance  Osteopenia - Plan: DG Bone Density  Anemia, iron deficiency  CKD (chronic kidney disease) stage 3, GFR 30-59 ml/min  Normocytic, normochromic anemia: iron BID  Health Maintenance Exam: The patient's preventative maintenance and recommended screening tests for an annual wellness exam were reviewed in full today. Brought up to date unless services declined.  Counselled on the importance of diet, exercise, and its role in overall health and mortality. The patient's FH and SH was reviewed, including their home life, tobacco status, and drug and alcohol status.  I have personally reviewed the Medicare Annual Wellness questionnaire and have noted 1. The patient's medical and social history 2. Their use of alcohol, tobacco or illicit drugs 3. Their current medications and supplements 4. The patient's functional ability including ADL's, fall risks, home safety risks and hearing or visual             impairment. 5. Diet and physical activities 6. Evidence for depression or mood disorders 7. Reviewed Updated provider list, see scanned forms and [facility] Snapshot.   The patients weight, height, BMI and visual acuity have been recorded in the chart I have made referrals, counseling and provided education to the patient based review of the above and I have provided the pt with a written personalized care plan for preventive services.  I have provided the patient with a copy of your personalized plan for preventive services. Instructed to take the time to review along with their updated medication list.  Follow-up: No Follow-up on file. Or follow-up in 1 year for complete physical examination  Orders Placed This Encounter  Procedures  . DG Bone Density    Signed,  Giorgi Debruin T. Faven Watterson, MD   Patient's Medications  New Prescriptions   No  medications on file  Previous Medications   AMLODIPINE (NORVASC) 10 MG TABLET    TAKE 1 TABLET (10 MG TOTAL) BY MOUTH DAILY BEFORE BREAKFAST.   ASPIRIN 81 MG TABLET    Take 81 mg by mouth daily.   FERROUS SULFATE 325 (65 FE) MG TABLET    Take 650 mg  by mouth daily with breakfast.   LISINOPRIL-HYDROCHLOROTHIAZIDE (PRINZIDE,ZESTORETIC) 20-25 MG TABLET    Take 1 tablet by mouth daily.   METOPROLOL (TOPROL-XL) 200 MG 24 HR TABLET    TAKE 1 TABLET (200 MG TOTAL) BY MOUTH DAILY.   VITAMIN B-12 (CYANOCOBALAMIN) 1000 MCG TABLET    Take 1,000 mcg by mouth daily.  Modified Medications   No medications on file  Discontinued Medications   HYDROCODONE-ACETAMINOPHEN (NORCO) 7.5-325 MG TABLET    Take 1 tablet by mouth every 6 (six) hours as needed for moderate pain.   ONDANSETRON (ZOFRAN ODT) 4 MG DISINTEGRATING TABLET    Take 1 tablet (4 mg total) by mouth every 8 (eight) hours as needed for nausea or vomiting.

## 2016-02-15 NOTE — Patient Instructions (Signed)

## 2016-02-15 NOTE — Progress Notes (Signed)
Pre visit review using our clinic review tool, if applicable. No additional management support is needed unless otherwise documented below in the visit note. 

## 2016-02-17 DIAGNOSIS — M8588 Other specified disorders of bone density and structure, other site: Secondary | ICD-10-CM | POA: Diagnosis not present

## 2016-02-17 DIAGNOSIS — Z780 Asymptomatic menopausal state: Secondary | ICD-10-CM | POA: Diagnosis not present

## 2016-02-24 ENCOUNTER — Other Ambulatory Visit: Payer: Self-pay | Admitting: Family Medicine

## 2016-02-24 ENCOUNTER — Telehealth: Payer: Self-pay | Admitting: *Deleted

## 2016-02-24 NOTE — Telephone Encounter (Signed)
Taylor Bush notified that her Bone Density Report showed Osteopenia.  Advised to take calcium 800 mg and Vitamin D 2000 units once daily per Dr. Lorelei Pont.  Follow up bone density study in 2 years.  Patient states understanding.

## 2016-03-02 ENCOUNTER — Encounter: Payer: Self-pay | Admitting: Family Medicine

## 2016-04-11 DIAGNOSIS — H5210 Myopia, unspecified eye: Secondary | ICD-10-CM | POA: Diagnosis not present

## 2016-04-11 DIAGNOSIS — H5211 Myopia, right eye: Secondary | ICD-10-CM | POA: Diagnosis not present

## 2016-06-09 DIAGNOSIS — K1321 Leukoplakia of oral mucosa, including tongue: Secondary | ICD-10-CM | POA: Diagnosis not present

## 2016-12-08 ENCOUNTER — Other Ambulatory Visit: Payer: Self-pay | Admitting: Family Medicine

## 2016-12-26 ENCOUNTER — Ambulatory Visit (INDEPENDENT_AMBULATORY_CARE_PROVIDER_SITE_OTHER): Payer: Medicare HMO | Admitting: Family Medicine

## 2016-12-26 ENCOUNTER — Ambulatory Visit (INDEPENDENT_AMBULATORY_CARE_PROVIDER_SITE_OTHER)
Admission: RE | Admit: 2016-12-26 | Discharge: 2016-12-26 | Disposition: A | Discharge status: Home / Self Care | Payer: Medicare HMO | Source: Ambulatory Visit | Attending: Family Medicine | Admitting: Family Medicine

## 2016-12-26 ENCOUNTER — Encounter: Payer: Self-pay | Admitting: Family Medicine

## 2016-12-26 VITALS — BP 124/60 | HR 57 | Temp 98.6°F | Ht 61.25 in | Wt 159.8 lb

## 2016-12-26 DIAGNOSIS — M5417 Radiculopathy, lumbosacral region: Secondary | ICD-10-CM

## 2016-12-26 DIAGNOSIS — M5416 Radiculopathy, lumbar region: Secondary | ICD-10-CM

## 2016-12-26 DIAGNOSIS — M545 Low back pain: Secondary | ICD-10-CM | POA: Diagnosis not present

## 2016-12-26 MED ORDER — DICLOFENAC SODIUM 75 MG PO TBEC: 75.0000 mg | DELAYED_RELEASE_TABLET | Freq: Two times a day (BID) | ORAL | 2 refills | Status: DC

## 2016-12-26 NOTE — Progress Notes (Signed)
Pre visit review using our clinic review tool, if applicable. No additional management support is needed unless otherwise documented below in the visit note. 

## 2016-12-26 NOTE — Progress Notes (Signed)
Dr. Frederico Hamman T. Lecia Esperanza, MD, [facility] [place], [address] Phone: [phone] Fax: [phone]  12/26/2016  Patient: Taylor Bush, MRN: [id number], DOB: 1946-10-03, 71 y.o.  Primary Physician:  Owens Loffler, MD   Chief Complaint  Patient presents with  . Back Pain   Subjective:   Taylor Bush is a 71 y.o. very pleasant female patient who presents with the following:  In christmas, hurt so bad that it could hardly walk. Now  Moved down into her - going down into the Left side posteriorly. Also with some B tingling.   Took some ibuprofen at night.  Doees not hurt all the time.  Now more in the lower part of her back.   Some pain on the side.  R leg is weaker.   Past Medical History, Surgical History, Social History, Family History, Problem List, Medications, and Allergies have been reviewed and updated if relevant.  Patient Active Problem List   Diagnosis Date Noted  . CKD (chronic kidney disease) stage 3, GFR 30-59 ml/min 11/29/2013  . Avascular necrosis of left femoral head ([place]) 12/16/2011  . Anemia, iron deficiency 12/16/2011  . Hx of total hip arthroplasty 12/15/2011  . AVN (avascular necrosis of bone) Right Hip 10/04/2011  . ATHEROSCLEROSIS W /INT CLAUDICATION 06/17/2009  . BACK PAIN, LUMBAR, WITH RADICULOPATHY 06/03/2009  . HYPERLIPIDEMIA 12/15/2008  . Essential hypertension 12/15/2008  . UNSPECIFIED OPEN-ANGLE GLAUCOMA 10/27/2008  . UNSPECIFIED CATARACT 10/27/2008    Past Medical History:  Diagnosis Date  . Anemia   . Arthritis    bilateral hips   . Blood transfusion    11/12   . Cataract   . CKD (chronic kidney disease) stage 3, GFR 30-59 ml/min 11/29/2013  . HLD (hyperlipidemia)   . HTN (hypertension)   . Hx of total hip arthroplasty 12/15/2011  . Lumbar back pain    with radiculopathy  . Open-angle glaucoma   . PAD (peripheral artery disease) ([place])    with intermittent claudication,  ABI 0.72 on Rt and 0.73 on L when assessed in 2010.     Past Surgical History:  Procedure Laterality Date  . abnormal vascular studies  7/10   ABI's 70, R SFA > 50%^ blockage   . cataract surgery  2/10  . EXCISION OF TONGUE LESION WITH LASER N/A 08/31/2015   Procedure: BIOPSY AND CO2 LASER OF TONGUE LESION ;  Surgeon: Izora Gala, MD;  Location: [facility];  Service: ENT;  Laterality: N/A;  . OTHER SURGICAL HISTORY     ganglion cyst removed left wrist   . TOTAL HIP ARTHROPLASTY  10/03/2011   Procedure: TOTAL HIP ARTHROPLASTY;  Surgeon: Gearlean Alf;  Location: [facility];  Service: Orthopedics;  Laterality: Right;  . TOTAL HIP ARTHROPLASTY  12/21/2011   Procedure: TOTAL HIP ARTHROPLASTY;  Surgeon: Gearlean Alf, MD;  Location: [facility];  Service: Orthopedics;  Laterality: Left;  . TUBAL LIGATION      Social History   Social History  . Marital status: Married    Spouse name: N/A  . Number of children: N/A  . Years of education: N/A   Occupational History  . Not on file.   Social History Main Topics  . Smoking status: Former Smoker    Quit date: 11/07/2008  . Smokeless tobacco: Never Used  . Alcohol use No  . Drug use: No  . Sexual activity: Not on file   Other  Topics Concern  . 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     Family History  Problem Relation Age of Onset  . Heart attack Father     22s  . Stroke Neg Hx     also no PAD or aneurysm  . Colon cancer Neg Hx   . Esophageal cancer Neg Hx   . Stomach cancer Neg Hx   . Rectal cancer Neg Hx     Allergies  Allergen Reactions  . Prednisone Other (See Comments)    Joint pain,joint stiffness  . Statins Rash    "hands broke out"    Medication list reviewed and updated in full in [facility].  GEN: no acute illness or fever CV: No chest pain or shortness of breath MSK: detailed above Neuro: neurological signs are described above ROS  O/w per HPI  Objective:   BP 124/60   Pulse (!) 57   Temp 98.6 F (37 C) (Oral)   Ht 5' 1.25" (1.556 m)   Wt 159 lb 12 oz (72.5 kg)   BMI 29.94 kg/m    GEN: Well-developed,well-nourished,in no acute distress; alert,appropriate and cooperative throughout examination HEENT: Normocephalic and atraumatic without obvious abnormalities. Ears, externally no deformities PULM: Breathing comfortably in no respiratory distress EXT: No clubbing, cyanosis, or edema PSYCH: Normally interactive. Cooperative during the interview. Pleasant. Friendly and conversant. Not anxious or depressed appearing. Normal, full affect.  Range of motion at  the waist: Flexion: mild pain Extension: normal Lateral bending: normal Rotation: all normal  No echymosis or edema Rises to examination table with no difficulty Gait: non antalgic  Inspection/Deformity: N Paraspinus Tenderness: l3-s1 mild ttp  B Ankle Dorsiflexion (L5,4): 5/5 B Great Toe Dorsiflexion (L5,4): 5/5 Heel Walk (L5): WNL Toe Walk (S1): WNL Rise/Squat (L4): WNL  SENSORY B Medial Foot (L4): WNL B Dorsum (L5): WNL B Lateral (S1): WNL Light Touch: WNL Pinprick: WNL  REFLEXES Knee (L4): 2+ Ankle (S1): 2+  B SLR, seated: neg B SLR, supine: neg B FABER: neg B Reverse FABER: neg B Greater Troch: NT B Log Roll: neg B Stork: NT B Sciatic Notch: mild ttp b  Hip flexion 4/5 Hip abb 4+/5  Radiology: Dg Lumbar Spine Complete  Result Date: 12/26/2016 CLINICAL DATA:  Low back pain for 2 months. Radiculopathy. No trauma. EXAM: LUMBAR SPINE - COMPLETE 4+ VIEW COMPARISON:  None. FINDINGS: Scoliotic curvature of the lumbar spine, apex to the right. Grade 1 anterolisthesis of L5 versus S1 and L4 versus L5. No other malalignment. No fractures. Mild degenerative changes with multilevel tiny anterior osteophytes. Atherosclerotic changes in the abdominal aorta. IMPRESSION: Mild anterolisthesis of L4 versus L5 and L5 versus S1. Mild multilevel  degenerative disc disease. Electronically Signed   By: Dorise Bullion III M.D   On: 12/26/2016 19:32     Assessment and Plan:   Lumbar back pain with radiculopathy affecting left lower extremity - Plan: Ambulatory referral to Physical Medicine Rehab, DG Lumbar Spine Complete  Chronic low back pain with intermittent radiculopathy.  Responded fairly well to epidural steroid injection in the past.  Reconsult Dr. Nelva Bush.  Start with Voltaren and basic rehabilitation.  Follow-up: No Follow-up on file.  Meds ordered this encounter  Medications  . diclofenac (VOLTAREN) 75 MG EC tablet    Sig: Take 1 tablet (75 mg total) by mouth 2 (two) times daily.    Dispense:  60 tablet    Refill:  2   There are no discontinued medications. Orders Placed This Encounter  Procedures  . DG Lumbar Spine Complete  . Ambulatory referral to Physical Medicine Rehab    Signed,  Maud Deed. Kevron Patella, MD   Allergies as of 12/26/2016      Reactions   Prednisone Other (See Comments)   Joint pain,joint stiffness   Statins Rash   "hands broke out"      Medication List       Accurate as of 12/26/16 11:59 PM. Always use your most recent med list.          amLODipine 10 MG tablet Commonly known as:  NORVASC TAKE 1 TABLET (10 MG TOTAL) BY MOUTH DAILY BEFORE BREAKFAST.   aspirin 81 MG tablet Take 81 mg by mouth daily.   diclofenac 75 MG EC tablet Commonly known as:  VOLTAREN Take 1 tablet (75 mg total) by mouth 2 (two) times daily.   ferrous sulfate 325 (65 FE) MG tablet Take 650 mg by mouth daily with breakfast.   lisinopril-hydrochlorothiazide 20-25 MG tablet Commonly known as:  PRINZIDE,ZESTORETIC TAKE 1 TABLET BY MOUTH DAILY.   metoprolol 200 MG 24 hr tablet Commonly known as:  TOPROL-XL TAKE 1 TABLET (200 MG TOTAL) BY MOUTH DAILY.   vitamin B-12 1000 MCG tablet Commonly known as:  CYANOCOBALAMIN Take 1,000 mcg by mouth daily.

## 2017-01-03 DIAGNOSIS — M5416 Radiculopathy, lumbar region: Secondary | ICD-10-CM | POA: Diagnosis not present

## 2017-01-24 DIAGNOSIS — M5416 Radiculopathy, lumbar region: Secondary | ICD-10-CM | POA: Diagnosis not present

## 2017-02-21 ENCOUNTER — Encounter: Payer: Self-pay | Admitting: Gastroenterology

## 2017-02-21 ENCOUNTER — Encounter: Payer: Self-pay | Admitting: Family Medicine

## 2017-02-21 DIAGNOSIS — Z1231 Encounter for screening mammogram for malignant neoplasm of breast: Secondary | ICD-10-CM | POA: Diagnosis not present

## 2017-02-22 DIAGNOSIS — M47816 Spondylosis without myelopathy or radiculopathy, lumbar region: Secondary | ICD-10-CM | POA: Diagnosis not present

## 2017-02-22 DIAGNOSIS — M5136 Other intervertebral disc degeneration, lumbar region: Secondary | ICD-10-CM | POA: Diagnosis not present

## 2017-03-22 DIAGNOSIS — M47816 Spondylosis without myelopathy or radiculopathy, lumbar region: Secondary | ICD-10-CM | POA: Diagnosis not present

## 2017-04-14 DIAGNOSIS — H524 Presbyopia: Secondary | ICD-10-CM | POA: Diagnosis not present

## 2017-04-14 DIAGNOSIS — H52221 Regular astigmatism, right eye: Secondary | ICD-10-CM | POA: Diagnosis not present

## 2017-04-14 DIAGNOSIS — D3132 Benign neoplasm of left choroid: Secondary | ICD-10-CM | POA: Diagnosis not present

## 2017-04-14 DIAGNOSIS — Z961 Presence of intraocular lens: Secondary | ICD-10-CM | POA: Diagnosis not present

## 2017-04-14 DIAGNOSIS — H5213 Myopia, bilateral: Secondary | ICD-10-CM | POA: Diagnosis not present

## 2017-04-14 DIAGNOSIS — H18413 Arcus senilis, bilateral: Secondary | ICD-10-CM | POA: Diagnosis not present

## 2017-04-14 DIAGNOSIS — Z9841 Cataract extraction status, right eye: Secondary | ICD-10-CM | POA: Diagnosis not present

## 2017-04-14 DIAGNOSIS — H3552 Pigmentary retinal dystrophy: Secondary | ICD-10-CM | POA: Diagnosis not present

## 2017-04-14 DIAGNOSIS — H52223 Regular astigmatism, bilateral: Secondary | ICD-10-CM | POA: Diagnosis not present

## 2017-04-27 ENCOUNTER — Ambulatory Visit (INDEPENDENT_AMBULATORY_CARE_PROVIDER_SITE_OTHER): Payer: Medicare HMO

## 2017-04-27 VITALS — BP 118/66 | HR 52 | Temp 97.9°F | Ht 61.0 in | Wt 155.5 lb

## 2017-04-27 DIAGNOSIS — I10 Essential (primary) hypertension: Secondary | ICD-10-CM | POA: Diagnosis not present

## 2017-04-27 DIAGNOSIS — N183 Chronic kidney disease, stage 3 unspecified: Secondary | ICD-10-CM

## 2017-04-27 DIAGNOSIS — E784 Other hyperlipidemia: Secondary | ICD-10-CM | POA: Diagnosis not present

## 2017-04-27 DIAGNOSIS — Z1159 Encounter for screening for other viral diseases: Secondary | ICD-10-CM | POA: Diagnosis not present

## 2017-04-27 DIAGNOSIS — D508 Other iron deficiency anemias: Secondary | ICD-10-CM | POA: Diagnosis not present

## 2017-04-27 DIAGNOSIS — Z0000 Encounter for general adult medical examination without abnormal findings: Secondary | ICD-10-CM

## 2017-04-27 DIAGNOSIS — E7849 Other hyperlipidemia: Secondary | ICD-10-CM

## 2017-04-27 LAB — BASIC METABOLIC PANEL
BUN: 23 mg/dL (ref 6–23)
CHLORIDE: 97 meq/L (ref 96–112)
CO2: 25 mEq/L (ref 19–32)
Calcium: 9.2 mg/dL (ref 8.4–10.5)
Creatinine, Ser: 1.67 mg/dL — ABNORMAL HIGH (ref 0.40–1.20)
GFR: 32.17 mL/min — ABNORMAL LOW (ref >60.00)
GLUCOSE: 94 mg/dL (ref 70–99)
POTASSIUM: 4.8 meq/L (ref 3.5–5.1)
SODIUM: 128 meq/L — AB (ref 135–145)

## 2017-04-27 LAB — HEPATIC FUNCTION PANEL
ALT: 12 U/L (ref 0–35)
AST: 15 U/L (ref 0–37)
Albumin: 4.2 g/dL (ref 3.5–5.2)
Alkaline Phosphatase: 53 U/L (ref 39–117)
BILIRUBIN DIRECT: 0.1 mg/dL (ref 0.0–0.3)
BILIRUBIN TOTAL: 0.4 mg/dL (ref 0.2–1.2)
Total Protein: 6.8 g/dL (ref 6.0–8.3)

## 2017-04-27 LAB — CBC WITH DIFFERENTIAL/PLATELET
BASOS PCT: 1.5 % (ref 0.0–3.0)
Basophils Absolute: 0.1 10*3/uL (ref 0.0–0.1)
EOS ABS: 0.3 10*3/uL (ref 0.0–0.7)
Eosinophils Relative: 3.1 % (ref 0.0–5.0)
HCT: 29.2 % — ABNORMAL LOW (ref 36.0–46.0)
Hemoglobin: 10 g/dL — ABNORMAL LOW (ref 12.0–15.0)
LYMPHS ABS: 2 10*3/uL (ref 0.7–4.0)
Lymphocytes Relative: 22.7 % (ref 12.0–46.0)
MCHC: 34.2 g/dL (ref 30.0–36.0)
MCV: 93.2 fl (ref 78.0–100.0)
MONO ABS: 0.9 10*3/uL (ref 0.1–1.0)
Monocytes Relative: 10.2 % (ref 3.0–12.0)
NEUTROS ABS: 5.4 10*3/uL (ref 1.4–7.7)
NEUTROS PCT: 62.5 % (ref 43.0–77.0)
PLATELETS: 310 10*3/uL (ref 150.0–400.0)
RBC: 3.14 Mil/uL — ABNORMAL LOW (ref 3.87–5.11)
RDW: 13.7 % (ref 11.5–15.5)
WBC: 8.7 10*3/uL (ref 4.0–10.5)

## 2017-04-27 LAB — LIPID PANEL
CHOLESTEROL: 238 mg/dL — AB (ref 0–200)
HDL: 53.2 mg/dL (ref >39.00)
LDL Cholesterol: 149 mg/dL — ABNORMAL HIGH (ref 0–99)
NonHDL: 184.48
Total CHOL/HDL Ratio: 4
Triglycerides: 178 mg/dL — ABNORMAL HIGH (ref 0.0–149.0)
VLDL: 35.6 mg/dL (ref 0.0–40.0)

## 2017-04-27 LAB — TSH: TSH: 2.67 u[IU]/mL (ref 0.35–4.50)

## 2017-04-27 LAB — IBC PANEL
Iron: 89 ug/dL (ref 42–145)
Saturation Ratios: 30 % (ref 20.0–50.0)
Transferrin: 212 mg/dL (ref 212.0–360.0)

## 2017-04-27 LAB — FERRITIN: FERRITIN: 755.6 ng/mL — AB (ref 10.0–291.0)

## 2017-04-27 NOTE — Progress Notes (Signed)
Subjective:   Taylor Bush is a 71 y.o. female who presents for Medicare Annual (Subsequent) preventive examination.  Review of Systems: N/A Cardiac Risk Factors include: advanced age (>63men, >68 women);dyslipidemia;obesity (BMI >30kg/m2);hypertension     Objective:     Vitals: BP 118/66 (BP Location: Right Arm, Patient Position: Sitting, Cuff Size: Normal)   Pulse (!) 52   Temp 97.9 F (36.6 C) (Oral)   Ht 5\' 1"  (1.549 m) Comment: no shoes  Wt 155 lb 8 oz (70.5 kg)   SpO2 99%   BMI 29.38 kg/m   Body mass index is 29.38 kg/m.   Tobacco History  Smoking Status  . Former Smoker  . Quit date: 11/07/2008  Smokeless Tobacco  . Never Used     Counseling given: No   Past Medical History:  Diagnosis Date  . Anemia   . Arthritis    bilateral hips   . Blood transfusion    11/12   . Cataract   . CKD (chronic kidney disease) stage 3, GFR 30-59 ml/min 11/29/2013  . HLD (hyperlipidemia)   . HTN (hypertension)   . Hx of total hip arthroplasty 12/15/2011  . Lumbar back pain    with radiculopathy  . Open-angle glaucoma   . PAD (peripheral artery disease) ([place])    with intermittent claudication, ABI 0.72 on Rt and 0.73 on L when assessed in 2010.    Past Surgical History:  Procedure Laterality Date  . abnormal vascular studies  7/10   ABI's 70, R SFA > 50%^ blockage   . cataract surgery  2/10  . EXCISION OF TONGUE LESION WITH LASER N/A 08/31/2015   Procedure: BIOPSY AND CO2 LASER OF TONGUE LESION ;  Surgeon: Izora Gala, MD;  Location: [facility];  Service: ENT;  Laterality: N/A;  . OTHER SURGICAL HISTORY     ganglion cyst removed left wrist   . TOTAL HIP ARTHROPLASTY  10/03/2011   Procedure: TOTAL HIP ARTHROPLASTY;  Surgeon: Gearlean Alf;  Location: [facility];  Service: Orthopedics;  Laterality: Right;  . TOTAL HIP ARTHROPLASTY  12/21/2011   Procedure: TOTAL HIP ARTHROPLASTY;  Surgeon: Gearlean Alf, MD;  Location: [facility];  Service: Orthopedics;   Laterality: Left;  . TUBAL LIGATION     Family History  Problem Relation Age of Onset  . Heart attack Father        20s  . Stroke Neg Hx        also no PAD or aneurysm  . Colon cancer Neg Hx   . Esophageal cancer Neg Hx   . Stomach cancer Neg Hx   . Rectal cancer Neg Hx    History  Sexual Activity  . Sexual activity: Not on file    Outpatient Encounter Prescriptions as of 04/27/2017  Medication Sig  . amLODipine (NORVASC) 10 MG tablet TAKE 1 TABLET (10 MG TOTAL) BY MOUTH DAILY BEFORE BREAKFAST.  Marland Kitchen aspirin 81 MG tablet Take 81 mg by mouth daily.  . diclofenac (VOLTAREN) 75 MG EC tablet Take 1 tablet (75 mg total) by mouth 2 (two) times daily.  . ferrous sulfate 325 (65 FE) MG tablet Take 650 mg by mouth daily with breakfast.  . lisinopril-hydrochlorothiazide (PRINZIDE,ZESTORETIC) 20-25 MG tablet TAKE 1 TABLET BY MOUTH DAILY.  . metoprolol (TOPROL-XL) 200 MG 24 hr tablet TAKE 1 TABLET (200 MG TOTAL) BY MOUTH DAILY.  . vitamin B-12 (CYANOCOBALAMIN) 1000 MCG tablet Take 1,000 mcg by mouth daily.   No facility-administered encounter  medications on file as of 04/27/2017.     Activities of Daily Living In your present state of health, do you have any difficulty performing the following activities: 04/27/2017  Hearing? N  Vision? N  Difficulty concentrating or making decisions? N  Walking or climbing stairs? Y  Dressing or bathing? N  Doing errands, shopping? N  Preparing Food and eating ? N  Using the Toilet? N  In the past six months, have you accidently leaked urine? N  Do you have problems with loss of bowel control? N  Managing your Medications? N  Managing your Finances? N  Housekeeping or managing your Housekeeping? N  Some recent data might be hidden    Patient Care Team: Owens Loffler, MD as PCP - General    Assessment:     Hearing Screening   125Hz  250Hz  500Hz  1000Hz  2000Hz  3000Hz  4000Hz  6000Hz  8000Hz   Right ear:   0 40 40  40    Left ear:   0 40 40  40      Vision Screening Comments: Last vision exam in 2018 with Dr. Heather [place]   Exercise Activities and Dietary recommendations Current Exercise Habits: The patient does not participate in regular exercise at present, Exercise limited by: orthopedic condition(s)  Goals    . healthy food choices          Starting 04/27/2017, I will reduce of intake of ice cream and increase intake of fruits and vegetables to 4-5 servings per day.      Fall Risk Fall Risk  04/27/2017 02/15/2016 03/04/2015  Falls in the past year? No No No   Depression Screen PHQ 2/9 Scores 04/27/2017 02/15/2016 03/04/2015  PHQ - 2 Score 0 0 0     Cognitive Function MMSE - Mini Mental State Exam 04/27/2017  Orientation to time 5  Orientation to Place 5  Registration 3  Attention/ Calculation 0  Recall 3  Language- name 2 objects 0  Language- repeat 1  Language- follow 3 step command 3  Language- read & follow direction 0  Write a sentence 0  Copy design 0  Total score 20     PLEASE NOTE: A Mini-Cog screen was completed. Maximum score is 20. A value of 0 denotes this part of [place] MMSE was not completed or the patient failed this part of the Mini-Cog screening.   Mini-Cog Screening Orientation to Time - Max 5 pts Orientation to Place - Max 5 pts Registration - Max 3 pts Recall - Max 3 pts Language Repeat - Max 1 pts Language Follow 3 Step Command - Max 3 pts     Immunization History  Administered Date(s) Administered  . Influenza, High Dose Seasonal PF 08/23/2016  . Influenza-Unspecified 07/08/2014, 08/08/2015, 08/08/2015  . Pneumococcal Conjugate-13 03/04/2015  . Pneumococcal Polysaccharide-23 09/05/2012   Screening Tests Health Maintenance  Topic Date Due  . COLONOSCOPY  05/06/2018 (Originally 04/13/2017)  . TETANUS/TDAP  04/28/2027 (Originally 07/21/1965)  . INFLUENZA VACCINE  06/07/2017  . MAMMOGRAM  02/22/2019  . DEXA SCAN  Completed  . Hepatitis C Screening  Completed  . PNA vac Low Risk Adult   Completed      Plan:     I have personally reviewed and addressed the Medicare Annual Wellness questionnaire and have noted the following in the patient's chart:  A. Medical and social history B. Use of alcohol, tobacco or illicit drugs  C. Current medications and supplements D. Functional ability and status E.  Nutritional status F.  Physical activity G. Advance directives H. List of other physicians I.  Hospitalizations, surgeries, and ER visits in previous 12 months J.  [place] to include hearing, vision, cognitive, depression L. Referrals and appointments - none  In addition, I have reviewed and discussed with patient certain preventive protocols, quality metrics, and best practice recommendations. A written personalized care plan for preventive services as well as general preventive health recommendations were provided to patient.  See attached scanned questionnaire for additional information.   Signed,   Lindell Noe, MHA, BS, LPN Health Coach

## 2017-04-27 NOTE — Progress Notes (Signed)
I reviewed health advisor's note, was available for consultation, and agree with documentation and plan.   Signed,  Satcha Storlie T. Feliberto Stockley, MD  

## 2017-04-27 NOTE — Patient Instructions (Signed)
Taylor Bush , Thank you for taking time to come for your Medicare Wellness Visit. I appreciate your ongoing commitment to your health goals. Please review the following plan we discussed and let me know if I can assist you in the future.   These are the goals we discussed: Goals    . healthy food choices          Starting 04/27/2017, I will reduce of intake of ice cream and increase intake of fruits and vegetables to 4-5 servings per day.       This is a list of the screening recommended for you and due dates:  Health Maintenance  Topic Date Due  . Colon Cancer Screening  05/06/2018*  . Tetanus Vaccine  04/28/2027*  . Flu Shot  06/07/2017  . Mammogram  02/22/2019  . DEXA scan (bone density measurement)  Completed  .  Hepatitis C: One time screening is recommended by Center for Disease Control  (CDC) for  adults born from [birth year] through 1965.   Completed  . Pneumonia vaccines  Completed  *Topic was postponed. The date shown is not the original due date.   Preventive Care for Adults  A healthy lifestyle and preventive care can promote health and wellness. Preventive health guidelines for adults include the following key practices.  . A routine yearly physical is a good way to check with your health care provider about your health and preventive screening. It is a chance to share any concerns and updates on your health and to receive a thorough exam.  . Visit your dentist for a routine exam and preventive care every 6 months. Brush your teeth twice a day and floss once a day. Good oral hygiene prevents tooth decay and gum disease.  . The frequency of eye exams is based on your age, health, family medical history, use  of contact lenses, and other factors. Follow your health care provider's ecommendations for frequency of eye exams.  . Eat a healthy diet. Foods like vegetables, fruits, whole grains, low-fat dairy products, and lean protein foods contain the nutrients you need without too  many calories. Decrease your intake of foods high in solid fats, added sugars, and salt. Eat the right amount of calories for you. Get information about a proper diet from your health care provider, if necessary.  . Regular physical exercise is one of the most important things you can do for your health. Most adults should get at least 150 minutes of moderate-intensity exercise (any activity that increases your heart rate and causes you to sweat) each week. In addition, most adults need muscle-strengthening exercises on 2 or more days a week.  Silver Sneakers may be a benefit available to you. To determine eligibility, you may visit the website: www.silversneakers.com or contact program at 626-543-4069 Mon-Fri between 8AM-8PM.   . Maintain a healthy weight. The body mass index (BMI) is a screening tool to identify possible weight problems. It provides an estimate of body fat based on height and weight. Your health care provider can find your BMI and can help you achieve or maintain a healthy weight.   For adults 20 years and older: ? A BMI below 18.5 is considered underweight. ? A BMI of 18.5 to 24.9 is normal. ? A BMI of 25 to 29.9 is considered overweight. ? A BMI of 30 and above is considered obese.   . Maintain normal blood lipids and cholesterol levels by exercising and minimizing your intake of saturated fat. Eat a  balanced diet with plenty of fruit and vegetables. Blood tests for lipids and cholesterol should begin at age 2 and be repeated every 5 years. If your lipid or cholesterol levels are high, you are over 50, or you are at high risk for heart disease, you may need your cholesterol levels checked more frequently. Ongoing high lipid and cholesterol levels should be treated with medicines if diet and exercise are not working.  . If you smoke, find out from your health care provider how to quit. If you do not use tobacco, please do not start.  . If you choose to drink alcohol, please  do not consume more than 2 drinks per day. One drink is considered to be 12 ounces (355 mL) of beer, 5 ounces (148 mL) of wine, or 1.5 ounces (44 mL) of liquor.  . If you are 85-68 years old, ask your health care provider if you should take aspirin to prevent strokes.  . Use sunscreen. Apply sunscreen liberally and repeatedly throughout the day. You should seek shade when your shadow is shorter than you. Protect yourself by wearing long sleeves, pants, a wide-brimmed hat, and sunglasses year round, whenever you are outdoors.  . Once a month, do a whole body skin exam, using a mirror to look at the skin on your back. Tell your health care provider of new moles, moles that have irregular borders, moles that are larger than a pencil eraser, or moles that have changed in shape or color.

## 2017-04-27 NOTE — Progress Notes (Signed)
Pre visit review using our clinic review tool, if applicable. No additional management support is needed unless otherwise documented below in the visit note. 

## 2017-04-27 NOTE — Progress Notes (Signed)
PCP notes:   Health maintenance:  Colonoscopy - addressed; pt plans to schedule future appt Tetanus - postponed/insurance  Abnormal screenings:   Hearing - failed  Patient concerns:   None  Nurse concerns:  None  Next PCP appt:   05/03/17 @ 1445

## 2017-04-28 LAB — HEPATITIS C ANTIBODY: HCV AB: NEGATIVE

## 2017-05-03 ENCOUNTER — Ambulatory Visit (INDEPENDENT_AMBULATORY_CARE_PROVIDER_SITE_OTHER)
Admission: RE | Admit: 2017-05-03 | Discharge: 2017-05-03 | Disposition: A | Discharge status: Home / Self Care | Payer: Medicare HMO | Source: Ambulatory Visit | Attending: Family Medicine | Admitting: Family Medicine

## 2017-05-03 ENCOUNTER — Encounter: Payer: Self-pay | Admitting: Family Medicine

## 2017-05-03 ENCOUNTER — Ambulatory Visit (INDEPENDENT_AMBULATORY_CARE_PROVIDER_SITE_OTHER): Payer: Medicare HMO | Admitting: Family Medicine

## 2017-05-03 VITALS — BP 116/64 | HR 61 | Temp 98.3°F | Ht 61.0 in | Wt 157.2 lb

## 2017-05-03 DIAGNOSIS — R05 Cough: Secondary | ICD-10-CM

## 2017-05-03 DIAGNOSIS — R059 Cough, unspecified: Secondary | ICD-10-CM

## 2017-05-03 DIAGNOSIS — Z0001 Encounter for general adult medical examination with abnormal findings: Secondary | ICD-10-CM | POA: Diagnosis not present

## 2017-05-03 DIAGNOSIS — Z87891 Personal history of nicotine dependence: Secondary | ICD-10-CM

## 2017-05-03 DIAGNOSIS — Z0000 Encounter for general adult medical examination without abnormal findings: Secondary | ICD-10-CM

## 2017-05-03 MED ORDER — TRAMADOL HCL 50 MG PO TABS: 50.0000 mg | ORAL_TABLET | Freq: Four times a day (QID) | ORAL | 2 refills | Status: AC | PRN

## 2017-05-03 NOTE — Progress Notes (Signed)
Dr. Frederico Hamman T. Ellizabeth Dacruz, MD, [facility] [place], [address] Phone: [phone] Fax: [phone]  05/03/2017  Patient: Taylor Bush, MRN: [id number], DOB: Dec 09, 1945, 71 y.o.  Primary Physician:  Owens Loffler, MD   Chief Complaint  Patient presents with  . Annual Exam   Subjective:   Taylor Bush is a 71 y.o. pleasant patient who presents with the following:  Health Maintenance Summary Reviewed and updated, unless pt declines services.  Tobacco History Reviewed. Non-smoker Alcohol: No concerns, no excessive use Exercise Habits: Active in a large garden, rec at least 30 mins 5 times a week STD concerns: none Drug Use: None Birth control method: n/a Menses regular: n/a Lumps or breast concerns: no Breast Cancer Family History: no  Health Maintenance  Topic Date Due  . COLONOSCOPY  05/06/2018 (Originally 04/13/2017)  . TETANUS/TDAP  04/28/2027 (Originally 07/21/1965)  . INFLUENZA VACCINE  06/07/2017  . MAMMOGRAM  02/22/2019  . DEXA SCAN  Completed  . Hepatitis C Screening  Completed  . PNA vac Low Risk Adult  Completed    Immunization History  Administered Date(s) Administered  . Influenza, High Dose Seasonal PF 08/23/2016  . Influenza-Unspecified 07/08/2014, 08/08/2015, 08/08/2015  . Pneumococcal Conjugate-13 03/04/2015  . Pneumococcal Polysaccharide-23 09/05/2012   Patient Active Problem List   Diagnosis Date Noted  . CKD (chronic kidney disease) stage 3, GFR 30-59 ml/min 11/29/2013  . Avascular necrosis of left femoral head ([place]) 12/16/2011  . Anemia, iron deficiency 12/16/2011  . Hx of total hip arthroplasty 12/15/2011  . AVN (avascular necrosis of bone) Right Hip 10/04/2011  . ATHEROSCLEROSIS W /INT CLAUDICATION 06/17/2009  . Lumbar back pain with radiculopathy affecting left lower extremity 06/03/2009  . HYPERLIPIDEMIA 12/15/2008  . Essential hypertension 12/15/2008  . UNSPECIFIED  OPEN-ANGLE GLAUCOMA 10/27/2008  . UNSPECIFIED CATARACT 10/27/2008   Past Medical History:  Diagnosis Date  . Anemia   . Arthritis    bilateral hips   . Blood transfusion    11/12   . Cataract   . CKD (chronic kidney disease) stage 3, GFR 30-59 ml/min 11/29/2013  . HLD (hyperlipidemia)   . HTN (hypertension)   . Hx of total hip arthroplasty 12/15/2011  . Lumbar back pain    with radiculopathy  . Open-angle glaucoma   . PAD (peripheral artery disease) ([place])    with intermittent claudication, ABI 0.72 on Rt and 0.73 on L when assessed in 2010.    Past Surgical History:  Procedure Laterality Date  . abnormal vascular studies  7/10   ABI's 70, R SFA > 50%^ blockage   . cataract surgery  2/10  . EXCISION OF TONGUE LESION WITH LASER N/A 08/31/2015   Procedure: BIOPSY AND CO2 LASER OF TONGUE LESION ;  Surgeon: Izora Gala, MD;  Location: [facility];  Service: ENT;  Laterality: N/A;  . OTHER SURGICAL HISTORY     ganglion cyst removed left wrist   . TOTAL HIP ARTHROPLASTY  10/03/2011   Procedure: TOTAL HIP ARTHROPLASTY;  Surgeon: Gearlean Alf;  Location: [facility];  Service: Orthopedics;  Laterality: Right;  . TOTAL HIP ARTHROPLASTY  12/21/2011   Procedure: TOTAL HIP ARTHROPLASTY;  Surgeon: Gearlean Alf, MD;  Location: [facility];  Service: Orthopedics;  Laterality: Left;  . TUBAL LIGATION     Social History   Social History  . Marital status: Married    Spouse name: N/A  . Number of children: N/A  .  Years of education: N/A   Occupational History  . Not on file.   Social History Main Topics  . Smoking status: Former Smoker    Quit date: 11/07/2008  . Smokeless tobacco: Never Used  . Alcohol use No  . Drug use: No  . Sexual activity: Not on file   Other Topics Concern  . 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    Family History  Problem Relation Age of Onset  . Heart attack Father        61s  . Stroke  Neg Hx        also no PAD or aneurysm  . Colon cancer Neg Hx   . Esophageal cancer Neg Hx   . Stomach cancer Neg Hx   . Rectal cancer Neg Hx    Allergies  Allergen Reactions  . Prednisone Other (See Comments)    Joint pain,joint stiffness  . Statins Rash    "hands broke out"    Medication list has been reviewed and updated.   General: Denies fever, chills, sweats. No significant weight loss. Eyes: Denies blurring,significant itching ENT: Denies earache, sore throat, and hoarseness.  Cardiovascular: Denies chest pains, palpitations, dyspnea on exertion,  Respiratory: Denies cough, dyspnea at rest,wheeezing Breast: no concerns about lumps GI: Denies nausea, vomiting, diarrhea, constipation, change in bowel habits, abdominal pain, melena, hematochezia GU: Denies dysuria, hematuria, urinary hesitancy, nocturia, denies STD risk, no concerns about discharge Musculoskeletal: Chronic back pain Derm: Denies rash, itching Neuro: Denies  paresthesias, frequent falls, frequent headaches Psych: Denies depression, anxiety Endocrine: Denies cold intolerance, heat intolerance, polydipsia Heme: Denies enlarged lymph nodes Allergy: No hayfever  Objective:   BP 116/64 (BP Location: Right Arm, Patient Position: Sitting, Cuff Size: Normal)   Pulse 61   Temp 98.3 F (36.8 C) (Oral)   Ht '5\' 1"'  (1.549 m)   Wt 157 lb 4 oz (71.3 kg)   SpO2 97%   BMI 29.71 kg/m  No exam data present  GEN: well developed, well nourished, no acute distress Eyes: conjunctiva and lids normal, PERRLA, EOMI ENT: TM clear, nares clear, oral exam WNL Neck: supple, no lymphadenopathy, no thyromegaly, no JVD Pulm: clear to auscultation and percussion, respiratory effort normal CV: regular rate and rhythm, S1-S2, no murmur, rub or gallop, no bruits Chest: no scars, masses, no lumps BREAST: breast exam declined GI: soft, non-tender; no hepatosplenomegaly, masses; active bowel sounds all quadrants GU: GU exam  declined Lymph: no cervical, axillary or inguinal adenopathy MSK: gait normal, muscle tone and strength WNL, no joint swelling, effusions, discoloration, crepitus  SKIN: clear, good turgor, color WNL, no rashes, lesions, or ulcerations Neuro: normal mental status, normal strength, sensation, and motion Psych: alert; oriented to person, place and time, normally interactive and not anxious or depressed in appearance.   All labs reviewed with patient. Lipids:    Component Value Date/Time   CHOL 238 (H) 04/27/2017 1155   TRIG 178.0 (H) 04/27/2017 1155   HDL 53.20 04/27/2017 1155   LDLDIRECT 167.5 11/28/2013 1148   VLDL 35.6 04/27/2017 1155   CHOLHDL 4 04/27/2017 1155   CBC: CBC Latest Ref Rng & Units 04/27/2017 02/10/2016 03/04/2015  WBC 4.0 - 10.5 K/uL 8.7 9.3 9.4  Hemoglobin 12.0 - 15.0 g/dL 10.0(L) 9.8(L) 10.7(L)  Hematocrit 36.0 - 46.0 % 29.2(L) 29.2(L) 30.9(L)  Platelets 150.0 - 400.0 K/uL 310.0 319.0 335.4    Basic Metabolic Panel:    Component  Value Date/Time   NA 128 (L) 04/27/2017 1155   K 4.8 04/27/2017 1155   CL 97 04/27/2017 1155   CO2 25 04/27/2017 1155   BUN 23 04/27/2017 1155   CREATININE 1.67 (H) 04/27/2017 1155   GLUCOSE 94 04/27/2017 1155   CALCIUM 9.2 04/27/2017 1155   Hepatic Function Latest Ref Rng & Units 04/27/2017 02/10/2016 03/04/2015  Total Protein 6.0 - 8.3 g/dL 6.8 6.7 7.0  Albumin 3.5 - 5.2 g/dL 4.2 4.1 4.2  AST 0 - 37 U/L '15 13 15  ' ALT 0 - 35 U/L '12 9 10  ' Alk Phosphatase 39 - 117 U/L 53 56 58  Total Bilirubin 0.2 - 1.2 mg/dL 0.4 0.4 0.4  Bilirubin, Direct 0.0 - 0.3 mg/dL 0.1 0.1 0.1    Lab Results  Component Value Date   TSH 2.67 04/27/2017   Dg Chest 2 View  Result Date: 05/03/2017 CLINICAL DATA:  Cough. EXAM: CHEST  2 VIEW COMPARISON:  09/22/2011 FINDINGS: The cardiomediastinal silhouette is unremarkable. There is no evidence of focal airspace disease, pulmonary edema, suspicious pulmonary nodule/mass, pleural effusion, or pneumothorax. No  acute bony abnormalities are identified. IMPRESSION: No active cardiopulmonary disease. Electronically Signed   By: Margarette Canada M.D.   On: 05/03/2017 17:06    Assessment and Plan:   Healthcare maintenance  History of smoking - Plan: DG Chest 2 View  Cough - Plan: DG Chest 2 View  With her kidney function, I'm going to make sure she stops all NSAIDs.  Tramadol and Tylenol as needed for pain.  Health Maintenance Exam: The patient's preventative maintenance and recommended screening tests for an annual wellness exam were reviewed in full today. Brought up to date unless services declined.  Counselled on the importance of diet, exercise, and its role in overall health and mortality. The patient's FH and SH was reviewed, including their home life, tobacco status, and drug and alcohol status.  Follow-up in 1 year for physical exam or additional follow-up below.  Follow-up: No Follow-up on file. Or follow-up in 1 year if not noted.  Future Appointments Date Time Provider [place]  05/01/2018 11:15 AM Eustace Pen, LPN [facility]  05/07/2018 2:45 PM Aedyn Mckeon, Frederico Hamman, MD [facility]    Meds ordered this encounter  Medications  . traMADol (ULTRAM) 50 MG tablet    Sig: Take 1 tablet (50 mg total) by mouth every 6 (six) hours as needed.    Dispense:  50 tablet    Refill:  2   Medications Discontinued During This Encounter  Medication Reason  . diclofenac (VOLTAREN) 75 MG EC tablet    Orders Placed This Encounter  Procedures  . DG Chest 2 View    Signed,  Frederico Hamman T. Tyniah Kastens, MD   Allergies as of 05/03/2017      Reactions   Prednisone Other (See Comments)   Joint pain,joint stiffness   Statins Rash   "hands broke out"      Medication List       Accurate as of 05/03/17 11:59 PM. Always use your most recent med list.          amLODipine 10 MG tablet Commonly known as:  NORVASC TAKE 1 TABLET (10 MG TOTAL) BY MOUTH DAILY BEFORE  BREAKFAST.   aspirin 81 MG tablet Take 81 mg by mouth daily.   ferrous sulfate 325 (65 FE) MG tablet Take 650 mg by mouth daily with breakfast.   lisinopril-hydrochlorothiazide 20-25 MG tablet Commonly known as:  PRINZIDE,ZESTORETIC TAKE  1 TABLET BY MOUTH DAILY.   metoprolol 200 MG 24 hr tablet Commonly known as:  TOPROL-XL TAKE 1 TABLET (200 MG TOTAL) BY MOUTH DAILY.   traMADol 50 MG tablet Commonly known as:  ULTRAM Take 1 tablet (50 mg total) by mouth every 6 (six) hours as needed.   vitamin B-12 1000 MCG tablet Commonly known as:  CYANOCOBALAMIN Take 1,000 mcg by mouth daily.

## 2017-05-05 ENCOUNTER — Encounter: Payer: Self-pay | Admitting: Gastroenterology

## 2017-06-15 ENCOUNTER — Other Ambulatory Visit: Payer: Self-pay | Admitting: Family Medicine

## 2017-06-28 ENCOUNTER — Ambulatory Visit (AMBULATORY_SURGERY_CENTER): Payer: Self-pay

## 2017-06-28 VITALS — Ht 62.0 in | Wt 155.2 lb

## 2017-06-28 DIAGNOSIS — Z86010 Personal history of colonic polyps: Secondary | ICD-10-CM

## 2017-06-28 MED ORDER — NA SULFATE-K SULFATE-MG SULF 17.5-3.13-1.6 GM/177ML PO SOLN: 1.0000 | Freq: Once | ORAL | 0 refills | Status: AC

## 2017-06-28 NOTE — Progress Notes (Signed)
Denies allergies to eggs or soy products. Denies complication of anesthesia or sedation. Denies use of weight loss medication. Denies use of O2.   Emmi instructions declined.  

## 2017-06-29 ENCOUNTER — Encounter: Payer: Self-pay | Admitting: Gastroenterology

## 2017-07-11 ENCOUNTER — Encounter: Payer: Self-pay | Admitting: Gastroenterology

## 2017-07-11 ENCOUNTER — Ambulatory Visit (AMBULATORY_SURGERY_CENTER): Payer: Medicare HMO | Admitting: Gastroenterology

## 2017-07-11 VITALS — BP 116/58 | HR 62 | Temp 95.8°F | Resp 18 | Ht 71.0 in | Wt 157.0 lb

## 2017-07-11 DIAGNOSIS — Z86010 Personal history of colonic polyps: Secondary | ICD-10-CM

## 2017-07-11 DIAGNOSIS — D122 Benign neoplasm of ascending colon: Secondary | ICD-10-CM | POA: Diagnosis not present

## 2017-07-11 DIAGNOSIS — N184 Chronic kidney disease, stage 4 (severe): Secondary | ICD-10-CM | POA: Diagnosis not present

## 2017-07-11 MED ORDER — SODIUM CHLORIDE 0.9 % IV SOLN: 500.0000 mL | INTRAVENOUS | Status: DC

## 2017-07-11 NOTE — Patient Instructions (Signed)
Impression/Recommendations:  Polyp handout given to patient. Diverticulosis handout given to patient.  Resume previous diet. Continue present medications.  Repeat colonoscopy recommended for surveillance.  Date to be determined after pathology results reviewed.  YOU HAD AN ENDOSCOPIC PROCEDURE TODAY AT [facility]:   Refer to the procedure report that was given to you for any specific questions about what was found during the examination.  If the procedure report does not answer your questions, please call your gastroenterologist to clarify.  If you requested that your care partner not be given the details of your procedure findings, then the procedure report has been included in a sealed envelope for you to review at your convenience later.  YOU SHOULD EXPECT: Some feelings of bloating in the abdomen. Passage of more gas than usual.  Walking can help get rid of the air that was put into your GI tract during the procedure and reduce the bloating. If you had a lower endoscopy (such as a colonoscopy or flexible sigmoidoscopy) you may notice spotting of blood in your stool or on the toilet paper. If you underwent a bowel prep for your procedure, you may not have a normal bowel movement for a few days.  Please Note:  You might notice some irritation and congestion in your nose or some drainage.  This is from the oxygen used during your procedure.  There is no need for concern and it should clear up in a day or so.  SYMPTOMS TO REPORT IMMEDIATELY:   Following lower endoscopy (colonoscopy or flexible sigmoidoscopy):  Excessive amounts of blood in the stool  Significant tenderness or worsening of abdominal pains  Swelling of the abdomen that is new, acute  Fever of 100F or higher  For urgent or emergent issues, a gastroenterologist can be reached at any hour by calling [phone].   DIET:  We do recommend a small meal at first, but then you may proceed to your regular diet.   Drink plenty of fluids but you should avoid alcoholic beverages for 24 hours.  ACTIVITY:  You should plan to take it easy for the rest of today and you should NOT DRIVE or use heavy machinery until tomorrow (because of the sedation medicines used during the test).    FOLLOW UP: Our staff will call the number listed on your records the next business day following your procedure to check on you and address any questions or concerns that you may have regarding the information given to you following your procedure. If we do not reach you, we will leave a message.  However, if you are feeling well and you are not experiencing any problems, there is no need to return our call.  We will assume that you have returned to your regular daily activities without incident.  If any biopsies were taken you will be contacted by phone or by letter within the next 1-3 weeks.  Please call us at [phone] if you have not heard about the biopsies in 3 weeks.    SIGNATURES/CONFIDENTIALITY: You and/or your care partner have signed paperwork which will be entered into your electronic medical record.  These signatures attest to the fact that that the information above on your After Visit Summary has been reviewed and is understood.  Full responsibility of the confidentiality of this discharge information lies with you and/or your care-partner.

## 2017-07-11 NOTE — Progress Notes (Signed)
Report given to PACU, vss 

## 2017-07-11 NOTE — Progress Notes (Signed)
Called to room to assist during endoscopic procedure.  Patient ID and intended procedure confirmed with present staff. Received instructions for my participation in the procedure from the performing physician.  

## 2017-07-11 NOTE — Op Note (Signed)
[facility] Patient Name: Taylor Bush Procedure Date: 07/11/2017 10:33 AM MRN: [id number] Endoscopist: Mallie Mussel L. Loletha Carrow , MD Age: 71 Referring MD:  Date of Birth: [birth year] Gender: Female Account #: [id number] Procedure:                Colonoscopy Indications:              Surveillance: Personal history of adenomatous                            polyps on last colonoscopy 5 years ago (< 72mm TA) Medicines:                Monitored Anesthesia Care Procedure:                Pre-Anesthesia Assessment:                           - Prior to the procedure, a History and Physical                            was performed, and patient medications and                            allergies were reviewed. The patient's tolerance of                            previous anesthesia was also reviewed. The risks                            and benefits of the procedure and the sedation                            options and risks were discussed with the patient.                            All questions were answered, and informed consent                            was obtained. Prior Anticoagulants: The patient has                            taken no previous anticoagulant or antiplatelet                            agents. ASA Grade Assessment: II - A patient with                            mild systemic disease. After reviewing the risks                            and benefits, the patient was deemed in                            satisfactory condition to undergo the procedure.  After obtaining informed consent, the colonoscope                            was passed under direct vision. Throughout the                            procedure, the patient's blood pressure, pulse, and                            oxygen saturations were monitored continuously. The                            Model PCF-[id number]) scope was introduced                            through the  anus and advanced to the the cecum,                            identified by appendiceal orifice and ileocecal                            valve. The colonoscopy was performed without                            difficulty. The patient tolerated the procedure                            well. The quality of the bowel preparation was                            good. The ileocecal valve, appendiceal orifice, and                            rectum were photographed. The quality of the bowel                            preparation was evaluated using the BBPS [facility]                            Bowel Preparation Scale) with scores of: Right                            Colon = 2, Transverse Colon = 2 and Left Colon = 2.                            The total BBPS score equals 6. The bowel                            preparation used was SUPREP. Scope In: 10:44:15 AM Scope Out: 11:00:24 AM Scope Withdrawal Time: 0 hours 12 minutes 45 seconds  Total Procedure Duration: 0 hours 16 minutes 9 seconds  Findings:                 The perianal  and digital rectal examinations were                            normal.                           A 6 mm x 74mm polyp was found in the mid ascending                            colon. The polyp was sessile. The polyp was removed                            with a piecemeal technique using a cold biopsy                            forceps. Resection and retrieval were complete.                           Multiple medium-mouthed diverticula were found in                            the left colon.                           The exam was otherwise without abnormality on                            direct and retroflexion views. Complications:            No immediate complications. Estimated Blood Loss:     Estimated blood loss: none. Impression:               - One 6 mm x 79mm polyp in the mid ascending colon,                            removed piecemeal using a cold biopsy forceps.                             Resected and retrieved.                           - Diverticulosis in the left colon.                           - The examination was otherwise normal on direct                            and retroflexion views. Recommendation:           - Patient has a contact number available for                            emergencies. The signs and symptoms of potential                            delayed complications were discussed with  the                            patient. Return to normal activities tomorrow.                            Written discharge instructions were provided to the                            patient.                           - Resume previous diet.                           - Continue present medications.                           - Await pathology results.                           - Repeat colonoscopy is recommended for                            surveillance. The colonoscopy date will be                            determined after pathology results from today's                            exam become available for review. Eh Sesay L. Loletha Carrow, MD 07/11/2017 11:05:06 AM This report has been signed electronically.

## 2017-07-12 ENCOUNTER — Telehealth: Payer: Self-pay | Admitting: *Deleted

## 2017-07-12 NOTE — Telephone Encounter (Signed)
  Follow up Call-  Call back number 07/11/2017  Post procedure Call Back phone  # [phone]  Permission to leave phone message Yes  Some recent data might be hidden     Patient questions:  Do you have a fever, pain , or abdominal swelling? No. Pain Score  0 *  Have you tolerated food without any problems? Yes.    Have you been able to return to your normal activities? Yes.    Do you have any questions about your discharge instructions: Diet   No. Medications  No. Follow up visit  No.  Do you have questions or concerns about your Care? No.  Actions: * If pain score is 4 or above: No action needed, pain <4.

## 2017-07-13 ENCOUNTER — Encounter: Payer: Self-pay | Admitting: Gastroenterology

## 2017-08-22 ENCOUNTER — Other Ambulatory Visit: Payer: Self-pay | Admitting: Family Medicine

## 2017-08-22 NOTE — Telephone Encounter (Signed)
Ok, 13, 2 ref.  Off and on use for years

## 2017-08-22 NOTE — Telephone Encounter (Signed)
Last office visit 05/03/2017.  Tramadol not on current medication list.  Refill?

## 2017-08-23 NOTE — Telephone Encounter (Signed)
Tramadol called into To be filled at: [id number] - [place], [place] Phone: [phone]

## 2017-08-23 NOTE — Telephone Encounter (Signed)
above

## 2017-08-31 ENCOUNTER — Ambulatory Visit (INDEPENDENT_AMBULATORY_CARE_PROVIDER_SITE_OTHER): Payer: Medicare HMO | Admitting: Family Medicine

## 2017-08-31 ENCOUNTER — Ambulatory Visit
Admission: RE | Admit: 2017-08-31 | Discharge: 2017-08-31 | Disposition: A | Discharge status: Home / Self Care | Payer: Medicare HMO | Source: Ambulatory Visit | Attending: Family Medicine | Admitting: Family Medicine

## 2017-08-31 ENCOUNTER — Telehealth: Payer: Self-pay

## 2017-08-31 ENCOUNTER — Encounter: Payer: Self-pay | Admitting: Family Medicine

## 2017-08-31 VITALS — BP 130/62 | HR 60 | Temp 98.6°F | Ht 61.0 in | Wt 152.0 lb

## 2017-08-31 DIAGNOSIS — M79661 Pain in right lower leg: Secondary | ICD-10-CM | POA: Diagnosis not present

## 2017-08-31 DIAGNOSIS — I868 Varicose veins of other specified sites: Secondary | ICD-10-CM | POA: Diagnosis not present

## 2017-08-31 DIAGNOSIS — M7989 Other specified soft tissue disorders: Secondary | ICD-10-CM | POA: Insufficient documentation

## 2017-08-31 NOTE — Progress Notes (Signed)
Dr. Frederico Hamman T. Kathleen Likins, MD, [facility] and Sports Medicine [place], [address] Phone: [phone] Fax: [phone]  08/31/2017  Patient: Taylor Bush, MRN: [id number], DOB: [birth year], 71 y.o.  Primary Physician:  Owens Loffler, MD   Chief Complaint  Patient presents with  . Leg Pain    Right Calf-x1 week   Subjective:   Taylor Bush is a 71 y.o. very pleasant female patient who presents with the following:  R leg sore to touch and swollen: has been there on sat.  Hurt with walking Swollen quite a bit throughout and vein areas are puffed out on the R much more than normal.   [phone] cell  Past Medical History, Surgical History, Social History, Family History, Problem List, Medications, and Allergies have been reviewed and updated if relevant.  Patient Active Problem List   Diagnosis Date Noted  . CKD (chronic kidney disease) stage 3, GFR 30-59 ml/min (HCC) 11/29/2013  . Avascular necrosis of left femoral head ([place]) 12/16/2011  . Anemia, iron deficiency 12/16/2011  . Hx of total hip arthroplasty 12/15/2011  . AVN (avascular necrosis of bone) Right Hip 10/04/2011  . ATHEROSCLEROSIS W /INT CLAUDICATION 06/17/2009  . Lumbar back pain with radiculopathy affecting left lower extremity 06/03/2009  . HYPERLIPIDEMIA 12/15/2008  . Essential hypertension 12/15/2008  . UNSPECIFIED OPEN-ANGLE GLAUCOMA 10/27/2008  . UNSPECIFIED CATARACT 10/27/2008    Past Medical History:  Diagnosis Date  . Allergy   . Anemia   . Arthritis    bilateral hips   . Blood transfusion    11/12   . Cataract   . CKD (chronic kidney disease) stage 3, GFR 30-59 ml/min (HCC) 11/29/2013  . HLD (hyperlipidemia)   . HTN (hypertension)   . Hx of total hip arthroplasty 12/15/2011  . Lumbar back pain    with radiculopathy  . Open-angle glaucoma   . PAD (peripheral artery disease) ([place])    with intermittent claudication, ABI 0.72 on Rt and 0.73 on L  when assessed in 2010.     Past Surgical History:  Procedure Laterality Date  . abnormal vascular studies  7/10   ABI's 70, R SFA > 50%^ blockage   . cataract surgery  2/10  . EXCISION OF TONGUE LESION WITH LASER N/A 08/31/2015   Procedure: BIOPSY AND CO2 LASER OF TONGUE LESION ;  Surgeon: Izora Gala, MD;  Location: [facility];  Service: ENT;  Laterality: N/A;  . OTHER SURGICAL HISTORY     ganglion cyst removed left wrist   . TOTAL HIP ARTHROPLASTY  10/03/2011   Procedure: TOTAL HIP ARTHROPLASTY;  Surgeon: Gearlean Alf;  Location: [facility];  Service: Orthopedics;  Laterality: Right;  . TOTAL HIP ARTHROPLASTY  12/21/2011   Procedure: TOTAL HIP ARTHROPLASTY;  Surgeon: Gearlean Alf, MD;  Location: [facility];  Service: Orthopedics;  Laterality: Left;  . TUBAL LIGATION      Social History   Social History  . Marital status: Married    Spouse name: N/A  . Number of children: N/A  . Years of education: N/A   Occupational History  . Not on file.   Social History Main Topics  . Smoking status: Former Smoker    Quit date: 11/07/2008  . Smokeless tobacco: Never Used  . Alcohol use No  . Drug use: No  . Sexual activity: Not on file   Other Topics Concern  . 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     Family History  Problem Relation Age of Onset  . Heart attack Father        20s  . Stroke Neg Hx        also no PAD or aneurysm  . Colon cancer Neg Hx   . Esophageal cancer Neg Hx   . Stomach cancer Neg Hx   . Rectal cancer Neg Hx     Allergies  Allergen Reactions  . Prednisone Other (See Comments)    Joint pain,joint stiffness  . Statins Rash    "hands broke out"    Medication list reviewed and updated in full in [place].   GEN: No acute illnesses, no fevers, chills. GI: No n/v/d, eating normally Pulm: No SOB Interactive and getting along well at home.  Otherwise, ROS is as per the  HPI.  Objective:   BP 130/62   Pulse 60   Temp 98.6 F (37 C) (Oral)   Ht 5\' 1"  (1.549 m)   Wt 152 lb (68.9 kg)   BMI 28.72 kg/m   GEN: WDWN, NAD, Non-toxic, A & O x 3 HEENT: Atraumatic, Normocephalic. Neck supple. No masses, No LAD. Ears and Nose: No external deformity. CV: RRR, No M/G/R. No JVD. No thrill. No extra heart sounds. PULM: CTA B, no wheezes, crackles, rhonchi. No retractions. No resp. distress. No accessory muscle use. EXTR: No c/c/ tr edema R LE. Prominent vasculature on R. Homan's + on r NEURO Normal gait.  PSYCH: Normally interactive. Conversant. Not depressed or anxious appearing.  Calm demeanor.   Laboratory and Imaging Data:  Assessment and Plan:   Pain and swelling of right lower leg - Plan: US Venous Img Lower Unilateral Right  STAT US of R LE to eval for DVT. Clinical appearance worrisome.  Follow-up: No Follow-up on file.  Future Appointments Date Time Provider [place]  05/01/2018 11:15 AM Eustace Pen, LPN [facility]  07/10/7901 2:45 PM Jafari Mckillop, Frederico Hamman, MD [facility]   Orders Placed This Encounter  Procedures  . US Venous Img Lower Unilateral Right    Signed,  Lajuane Leatham T. Salman Wellen, MD   Allergies as of 08/31/2017      Reactions   Prednisone Other (See Comments)   Joint pain,joint stiffness   Statins Rash   "hands broke out"      Medication List       Accurate as of 08/31/17 11:01 AM. Always use your most recent med list.          amLODipine 10 MG tablet Commonly known as:  NORVASC TAKE 1 TABLET (10 MG TOTAL) BY MOUTH DAILY BEFORE BREAKFAST.   aspirin 81 MG tablet Take 81 mg by mouth daily.   ferrous sulfate 325 (65 FE) MG tablet Take 650 mg by mouth daily with breakfast.   lisinopril-hydrochlorothiazide 20-25 MG tablet Commonly known as:  PRINZIDE,ZESTORETIC TAKE 1 TABLET BY MOUTH DAILY.   metoprolol 200 MG 24 hr tablet Commonly known as:  TOPROL-XL TAKE 1 TABLET (200 MG TOTAL) BY MOUTH DAILY.     OVER THE COUNTER MEDICATION Calcium 900 mg. One 600 mg tablet and 1/2 of a 600 mg tablet one time a day.   traMADol 50 MG tablet Commonly known as:  ULTRAM TAKE 1 TABLET BY MOUTH EVERY 6 HOURS AS NEEDED   vitamin B-12 1000 MCG tablet Commonly known as:  CYANOCOBALAMIN Take 1,000 mcg by mouth daily.

## 2017-08-31 NOTE — Telephone Encounter (Signed)
[facility] in [place] called report leg [place]; No evidence of DVT; thrombosed varicosities in the upper lateral right calf compatible with thrombophlebitis. Report taken to Dr Copland's area and report in [place]; pt is not waiting.

## 2017-08-31 NOTE — Patient Instructions (Signed)

## 2017-08-31 NOTE — Telephone Encounter (Signed)
Dr Lorelei Pont has already responded to note and sent to [facility].

## 2017-11-15 ENCOUNTER — Other Ambulatory Visit: Payer: Self-pay | Admitting: *Deleted

## 2017-11-15 MED ORDER — AMLODIPINE BESYLATE 10 MG PO TABS: ORAL_TABLET | ORAL | 1 refills | Status: DC

## 2017-11-15 MED ORDER — LISINOPRIL-HYDROCHLOROTHIAZIDE 20-25 MG PO TABS: 1.0000 | ORAL_TABLET | Freq: Every day | ORAL | 1 refills | Status: DC

## 2017-11-15 MED ORDER — METOPROLOL SUCCINATE ER 200 MG PO TB24: ORAL_TABLET | ORAL | 1 refills | Status: DC

## 2017-12-20 ENCOUNTER — Other Ambulatory Visit: Payer: Self-pay | Admitting: Family Medicine

## 2018-02-27 ENCOUNTER — Telehealth: Payer: Self-pay | Admitting: Family Medicine

## 2018-02-27 ENCOUNTER — Other Ambulatory Visit: Payer: Self-pay | Admitting: Family Medicine

## 2018-02-27 DIAGNOSIS — M8580 Other specified disorders of bone density and structure, unspecified site: Secondary | ICD-10-CM

## 2018-02-27 NOTE — Telephone Encounter (Signed)
done

## 2018-02-27 NOTE — Telephone Encounter (Signed)
Copied from [place]. Topic: General - Other >> Feb 26, 2018  4:47 PM Valla Leaver wrote: Reason for CRM: Patient scheduled bone density at [facility] on [address] in [place] Wednesday 05/01 at 2:30pm and needs Dr. Lorelei Pont to place an order for it at that location so she can have it done. Please call patient once the order has been placed. See's Dr. Lorelei Pont.

## 2018-03-07 ENCOUNTER — Encounter: Payer: Self-pay | Admitting: Family Medicine

## 2018-03-07 DIAGNOSIS — Z1231 Encounter for screening mammogram for malignant neoplasm of breast: Secondary | ICD-10-CM | POA: Diagnosis not present

## 2018-03-07 DIAGNOSIS — M8589 Other specified disorders of bone density and structure, multiple sites: Secondary | ICD-10-CM | POA: Diagnosis not present

## 2018-03-07 LAB — HM DEXA SCAN

## 2018-03-12 ENCOUNTER — Encounter: Payer: Self-pay | Admitting: Family Medicine

## 2018-03-19 ENCOUNTER — Encounter: Payer: Self-pay | Admitting: Family Medicine

## 2018-05-01 ENCOUNTER — Ambulatory Visit (INDEPENDENT_AMBULATORY_CARE_PROVIDER_SITE_OTHER): Payer: Medicare HMO

## 2018-05-01 ENCOUNTER — Ambulatory Visit: Payer: Medicare HMO

## 2018-05-01 VITALS — BP 130/76 | HR 65 | Temp 98.5°F | Ht 61.5 in | Wt 153.8 lb

## 2018-05-01 DIAGNOSIS — I10 Essential (primary) hypertension: Secondary | ICD-10-CM

## 2018-05-01 DIAGNOSIS — E559 Vitamin D deficiency, unspecified: Secondary | ICD-10-CM | POA: Diagnosis not present

## 2018-05-01 DIAGNOSIS — E782 Mixed hyperlipidemia: Secondary | ICD-10-CM

## 2018-05-01 DIAGNOSIS — Z0000 Encounter for general adult medical examination without abnormal findings: Secondary | ICD-10-CM

## 2018-05-01 DIAGNOSIS — D509 Iron deficiency anemia, unspecified: Secondary | ICD-10-CM | POA: Diagnosis not present

## 2018-05-01 LAB — BASIC METABOLIC PANEL
BUN: 18 mg/dL (ref 6–23)
CALCIUM: 9 mg/dL (ref 8.4–10.5)
CHLORIDE: 104 meq/L (ref 96–112)
CO2: 24 meq/L (ref 19–32)
CREATININE: 1.42 mg/dL — AB (ref 0.40–1.20)
GFR: 38.67 mL/min — ABNORMAL LOW (ref >60.00)
GLUCOSE: 100 mg/dL — AB (ref 70–99)
Potassium: 5.4 mEq/L — ABNORMAL HIGH (ref 3.5–5.1)
Sodium: 134 mEq/L — ABNORMAL LOW (ref 135–145)

## 2018-05-01 LAB — FERRITIN: Ferritin: 641.3 ng/mL — ABNORMAL HIGH (ref 10.0–291.0)

## 2018-05-01 LAB — LIPID PANEL
CHOLESTEROL: 225 mg/dL — AB (ref 0–200)
HDL: 46.5 mg/dL (ref >39.00)
NonHDL: 178.86
TRIGLYCERIDES: 246 mg/dL — AB (ref 0.0–149.0)
Total CHOL/HDL Ratio: 5
VLDL: 49.2 mg/dL — ABNORMAL HIGH (ref 0.0–40.0)

## 2018-05-01 LAB — IBC PANEL
Iron: 80 ug/dL (ref 42–145)
Saturation Ratios: 26.6 % (ref 20.0–50.0)
Transferrin: 215 mg/dL (ref 212.0–360.0)

## 2018-05-01 LAB — CBC WITH DIFFERENTIAL/PLATELET
BASOS PCT: 1.1 % (ref 0.0–3.0)
Basophils Absolute: 0.1 10*3/uL (ref 0.0–0.1)
EOS ABS: 0.5 10*3/uL (ref 0.0–0.7)
EOS PCT: 5.9 % — AB (ref 0.0–5.0)
HEMATOCRIT: 27.4 % — AB (ref 36.0–46.0)
Hemoglobin: 9.5 g/dL — ABNORMAL LOW (ref 12.0–15.0)
LYMPHS PCT: 22.2 % (ref 12.0–46.0)
Lymphs Abs: 1.8 10*3/uL (ref 0.7–4.0)
MCHC: 34.5 g/dL (ref 30.0–36.0)
MCV: 94.9 fl (ref 78.0–100.0)
Monocytes Absolute: 0.9 10*3/uL (ref 0.1–1.0)
Monocytes Relative: 10.8 % (ref 3.0–12.0)
Neutro Abs: 4.9 10*3/uL (ref 1.4–7.7)
Neutrophils Relative %: 60 % (ref 43.0–77.0)
Platelets: 306 10*3/uL (ref 150.0–400.0)
RBC: 2.89 Mil/uL — ABNORMAL LOW (ref 3.87–5.11)
RDW: 13.8 % (ref 11.5–15.5)
WBC: 8.1 10*3/uL (ref 4.0–10.5)

## 2018-05-01 LAB — HEPATIC FUNCTION PANEL
ALT: 9 U/L (ref 0–35)
AST: 13 U/L (ref 0–37)
Albumin: 4.1 g/dL (ref 3.5–5.2)
Alkaline Phosphatase: 65 U/L (ref 39–117)
BILIRUBIN DIRECT: 0.1 mg/dL (ref 0.0–0.3)
TOTAL PROTEIN: 6.6 g/dL (ref 6.0–8.3)
Total Bilirubin: 0.3 mg/dL (ref 0.2–1.2)

## 2018-05-01 LAB — LDL CHOLESTEROL, DIRECT: LDL DIRECT: 121 mg/dL

## 2018-05-01 LAB — VITAMIN D 25 HYDROXY (VIT D DEFICIENCY, FRACTURES): VITD: 21.88 ng/mL — AB (ref 30.00–100.00)

## 2018-05-01 NOTE — Patient Instructions (Signed)
Taylor Bush , Thank you for taking time to come for your Medicare Wellness Visit. I appreciate your ongoing commitment to your health goals. Please review the following plan we discussed and let me know if I can assist you in the future.   These are the goals we discussed: Goals    . Patient Stated     Starting 05/01/2018, I will continue to take medications as prescribed.        This is a list of the screening recommended for you and due dates:  Health Maintenance  Topic Date Due  . Tetanus Vaccine  04/28/2027*  . Flu Shot  06/07/2018  . Mammogram  03/07/2020  . Colon Cancer Screening  07/11/2022  . DEXA scan (bone density measurement)  Completed  .  Hepatitis C: One time screening is recommended by Center for Disease Control  (CDC) for  adults born from [birth year] through 1965.   Completed  . Pneumonia vaccines  Completed  *Topic was postponed. The date shown is not the original due date.   Preventive Care for Adults  A healthy lifestyle and preventive care can promote health and wellness. Preventive health guidelines for adults include the following key practices.  . A routine yearly physical is a good way to check with your health care provider about your health and preventive screening. It is a chance to share any concerns and updates on your health and to receive a thorough exam.  . Visit your dentist for a routine exam and preventive care every 6 months. Brush your teeth twice a day and floss once a day. Good oral hygiene prevents tooth decay and gum disease.  . The frequency of eye exams is based on your age, health, family medical history, use  of contact lenses, and other factors. Follow your health care provider's recommendations for frequency of eye exams.  . Eat a healthy diet. Foods like vegetables, fruits, whole grains, low-fat dairy products, and lean protein foods contain the nutrients you need without too many calories. Decrease your intake of foods high in solid fats,  added sugars, and salt. Eat the right amount of calories for you. Get information about a proper diet from your health care provider, if necessary.  . Regular physical exercise is one of the most important things you can do for your health. Most adults should get at least 150 minutes of moderate-intensity exercise (any activity that increases your heart rate and causes you to sweat) each week. In addition, most adults need muscle-strengthening exercises on 2 or more days a week.  Silver Sneakers may be a benefit available to you. To determine eligibility, you may visit the website: www.silversneakers.com or contact program at [phone] Mon-Fri between 8AM-8PM.   . Maintain a healthy weight. The body mass index (BMI) is a screening tool to identify possible weight problems. It provides an estimate of body fat based on height and weight. Your health care provider can find your BMI and can help you achieve or maintain a healthy weight.   For adults 20 years and older: ? A BMI below 18.5 is considered underweight. ? A BMI of 18.5 to 24.9 is normal. ? A BMI of 25 to 29.9 is considered overweight. ? A BMI of 30 and above is considered obese.   . Maintain normal blood lipids and cholesterol levels by exercising and minimizing your intake of saturated fat. Eat a balanced diet with plenty of fruit and vegetables. Blood tests for lipids and cholesterol should begin at  age 41 and be repeated every 5 years. If your lipid or cholesterol levels are high, you are over 50, or you are at high risk for heart disease, you may need your cholesterol levels checked more frequently. Ongoing high lipid and cholesterol levels should be treated with medicines if diet and exercise are not working.  . If you smoke, find out from your health care provider how to quit. If you do not use tobacco, please do not start.  . If you choose to drink alcohol, please do not consume more than 2 drinks per day. One drink is  considered to be 12 ounces (355 mL) of beer, 5 ounces (148 mL) of wine, or 1.5 ounces (44 mL) of liquor.  . If you are 61-37 years old, ask your health care provider if you should take aspirin to prevent strokes.  . Use sunscreen. Apply sunscreen liberally and repeatedly throughout the day. You should seek shade when your shadow is shorter than you. Protect yourself by wearing long sleeves, pants, a wide-brimmed hat, and sunglasses year round, whenever you are outdoors.  . Once a month, do a whole body skin exam, using a mirror to look at the skin on your back. Tell your health care provider of new moles, moles that have irregular borders, moles that are larger than a pencil eraser, or moles that have changed in shape or color.

## 2018-05-02 NOTE — Progress Notes (Signed)
PCP notes:   Health maintenance:  No gaps identified.   Abnormal screenings:   None  Patient concerns:   None  Nurse concerns:  None  Next PCP appt:   05/07/18 @ 1440

## 2018-05-02 NOTE — Progress Notes (Signed)
Subjective:   Taylor Bush is a 72 y.o. female who presents for Medicare Annual (Subsequent) preventive examination.  Review of Systems:  N/A Cardiac Risk Factors include: advanced age (>17men, >21 women);hypertension;dyslipidemia     Objective:     Vitals: BP 130/76 (BP Location: Left Arm, Patient Position: Sitting, Cuff Size: Normal)   Pulse 65   Temp 98.5 F (36.9 C) (Oral)   Ht 5' 1.5" (1.562 m) Comment: no shoes  Wt 153 lb 12 oz (69.7 kg)   SpO2 95%   BMI 28.58 kg/m   Body mass index is 28.58 kg/m.  Advanced Directives 05/01/2018 07/11/2017 06/28/2017 04/27/2017 08/24/2015 12/21/2011 12/19/2011  Does Patient Have a Medical Advance Directive? No No No No No Patient does not have advance directive Patient does not have advance directive  Would patient like information on creating a medical advance directive? No - Patient declined - - - - - -  Pre-existing out of facility DNR order (yellow form or pink MOST form) - - - - - No No    Tobacco Social History   Tobacco Use  Smoking Status Former Smoker  . Last attempt to quit: 11/07/2008  . Years since quitting: 9.4  Smokeless Tobacco Never Used     Counseling given: No   Clinical Intake:  Pre-visit preparation completed: Yes  Pain : No/denies pain Pain Score: 0-No pain     Nutritional Status: BMI 25 -29 Overweight Nutritional Risks: None  How often do you need to have someone help you when you read instructions, pamphlets, or other written materials from your doctor or pharmacy?: 1 - Never What is the last grade level you completed in school?: 12th grade  Interpreter Needed?: No  Comments: pt is a widow and lives alone Information entered by :: LPinson, LPN  Past Medical History:  Diagnosis Date  . Allergy   . Anemia   . Arthritis    bilateral hips   . Blood transfusion    11/12   . Cataract   . CKD (chronic kidney disease) stage 3, GFR 30-59 ml/min (HCC) 11/29/2013  . HLD (hyperlipidemia)   . HTN  (hypertension)   . Hx of total hip arthroplasty 12/15/2011  . Lumbar back pain    with radiculopathy  . Open-angle glaucoma   . PAD (peripheral artery disease) ([place])    with intermittent claudication, ABI 0.72 on Rt and 0.73 on L when assessed in 2010.    Past Surgical History:  Procedure Laterality Date  . abnormal vascular studies  7/10   ABI's 70, R SFA > 50%^ blockage   . cataract surgery  2/10  . EXCISION OF TONGUE LESION WITH LASER N/A 08/31/2015   Procedure: BIOPSY AND CO2 LASER OF TONGUE LESION ;  Surgeon: Izora Gala, MD;  Location: [facility];  Service: ENT;  Laterality: N/A;  . OTHER SURGICAL HISTORY     ganglion cyst removed left wrist   . TOTAL HIP ARTHROPLASTY  10/03/2011   Procedure: TOTAL HIP ARTHROPLASTY;  Surgeon: Gearlean Alf;  Location: [facility];  Service: Orthopedics;  Laterality: Right;  . TOTAL HIP ARTHROPLASTY  12/21/2011   Procedure: TOTAL HIP ARTHROPLASTY;  Surgeon: Gearlean Alf, MD;  Location: [facility];  Service: Orthopedics;  Laterality: Left;  . TUBAL LIGATION     Family History  Problem Relation Age of Onset  . Heart attack Father        64s  . Stroke Neg Hx  also no PAD or aneurysm  . Colon cancer Neg Hx   . Esophageal cancer Neg Hx   . Stomach cancer Neg Hx   . Rectal cancer Neg Hx    Social History   Socioeconomic History  . Marital status: Married    Spouse name: Not on file  . Number of children: Not on file  . Years of education: Not on file  . Highest education level: Not on file  Occupational History  . Not on file  Social Needs  . Financial resource strain: Not on file  . Food insecurity:    Worry: Not on file    Inability: Not on file  . Transportation needs:    Medical: Not on file    Non-medical: Not on file  Tobacco Use  . Smoking status: Former Smoker    Last attempt to quit: 11/07/2008    Years since quitting: 9.4  . Smokeless tobacco: Never Used  Substance and Sexual Activity  . Alcohol use: No    . Drug use: No  . Sexual activity: Not on file  Lifestyle  . Physical activity:    Days per week: Not on file    Minutes per session: Not on file  . Stress: Not on file  Relationships  . Social connections:    Talks on phone: Not on file    Gets together: Not on file    Attends religious service: Not on file    Active member of club or organization: Not on file    Attends meetings of clubs or organizations: Not on file    Relationship status: Not on file  Other Topics Concern  . 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     Outpatient Encounter Medications as of 05/01/2018  Medication Sig  . amLODipine (NORVASC) 10 MG tablet TAKE 1 TABLET (10 MG TOTAL) BY MOUTH DAILY BEFORE BREAKFAST.  Marland Kitchen aspirin 81 MG tablet Take 81 mg by mouth daily.  . ferrous sulfate 325 (65 FE) MG tablet Take 650 mg by mouth daily with breakfast.  . lisinopril-hydrochlorothiazide (PRINZIDE,ZESTORETIC) 20-25 MG tablet Take 1 tablet by mouth daily.  . metoprolol (TOPROL-XL) 200 MG 24 hr tablet TAKE 1 TABLET (200 MG TOTAL) BY MOUTH DAILY.  Marland Kitchen OVER THE COUNTER MEDICATION Calcium 900 mg. One 600 mg tablet and 1/2 of a 600 mg tablet one time a day.  . traMADol (ULTRAM) 50 MG tablet TAKE 1 TABLET BY MOUTH EVERY 6 HOURS AS NEEDED  . vitamin B-12 (CYANOCOBALAMIN) 1000 MCG tablet Take 1,000 mcg by mouth daily.   Facility-Administered Encounter Medications as of 05/01/2018  Medication  . 0.9 %  sodium chloride infusion    Activities of Daily Living In your present state of health, do you have any difficulty performing the following activities: 05/01/2018  Hearing? N  Vision? N  Difficulty concentrating or making decisions? N  Walking or climbing stairs? N  Dressing or bathing? N  Doing errands, shopping? N  Preparing Food and eating ? N  Using the Toilet? N  In the past six months, have you accidently leaked urine? N  Do you have problems with loss of bowel  control? N  Managing your Medications? N  Managing your Finances? N  Housekeeping or managing your Housekeeping? N  Some recent data might be hidden    Patient Care Team: Owens Loffler, MD as PCP - General    Assessment:   This  is a routine wellness examination for Taylor Bush.   Hearing Screening   125Hz  250Hz  500Hz  1000Hz  2000Hz  3000Hz  4000Hz  6000Hz  8000Hz   Right ear:   40 40 40  40    Left ear:   40 40 40  40    Vision Screening Comments: July 2018 with Dr. Heather [place]    Exercise Activities and Dietary recommendations Current Exercise Habits: The patient does not participate in regular exercise at present, Exercise limited by: None identified  Goals    . Patient Stated     Starting 05/01/2018, I will continue to take medications as prescribed.        Fall Risk Fall Risk  05/01/2018 04/27/2017 02/15/2016 03/04/2015  Falls in the past year? No No No No   Depression Screen PHQ 2/9 Scores 05/01/2018 04/27/2017 02/15/2016 03/04/2015  PHQ - 2 Score 0 0 0 0  PHQ- 9 Score 0 - - -     Cognitive Function MMSE - Mini Mental State Exam 05/01/2018 04/27/2017  Orientation to time 5 5  Orientation to Place 5 5  Registration 3 3  Attention/ Calculation 0 0  Recall 3 3  Language- name 2 objects 0 0  Language- repeat 1 1  Language- follow 3 step command 3 3  Language- read & follow direction 0 0  Write a sentence 0 0  Copy design 0 0  Total score 20 20     PLEASE NOTE: A Mini-Cog screen was completed. Maximum score is 20. A value of 0 denotes this part of [place] MMSE was not completed or the patient failed this part of the Mini-Cog screening.   Mini-Cog Screening Orientation to Time - Max 5 pts Orientation to Place - Max 5 pts Registration - Max 3 pts Recall - Max 3 pts Language Repeat - Max 1 pts Language Follow 3 Step Command - Max 3 pts c    Immunization History  Administered Date(s) Administered  . Influenza, High Dose Seasonal PF 08/23/2016, 08/24/2017  .  Influenza-Unspecified 07/08/2014, 08/08/2015, 08/08/2015  . Pneumococcal Conjugate-13 03/04/2015  . Pneumococcal Polysaccharide-23 09/05/2012    Screening Tests Health Maintenance  Topic Date Due  . TETANUS/TDAP  04/28/2027 (Originally 07/21/1965)  . INFLUENZA VACCINE  06/07/2018  . MAMMOGRAM  03/07/2020  . COLONOSCOPY  07/11/2022  . DEXA SCAN  Completed  . Hepatitis C Screening  Completed  . PNA vac Low Risk Adult  Completed       Plan:     I have personally reviewed, addressed, and noted the following in the patient's chart:  A. Medical and social history B. Use of alcohol, tobacco or illicit drugs  C. Current medications and supplements D. Functional ability and status E.  Nutritional status F.  Physical activity G. Advance directives H. List of other physicians I.  Hospitalizations, surgeries, and ER visits in previous 12 months J.  [place] to include hearing, vision, cognitive, depression L. Referrals and appointments - none  In addition, I have reviewed and discussed with patient certain preventive protocols, quality metrics, and best practice recommendations. A written personalized care plan for preventive services as well as general preventive health recommendations were provided to patient.  See attached scanned questionnaire for additional information.   Signed,   Lindell Noe, MHA, BS, LPN Health Coach   Lindell Noe, [place]  1/60/7371

## 2018-05-03 NOTE — Progress Notes (Signed)
I reviewed health advisor's note, was available for consultation, and agree with documentation and plan.  

## 2018-05-07 ENCOUNTER — Encounter: Payer: Self-pay | Admitting: Family Medicine

## 2018-05-07 ENCOUNTER — Ambulatory Visit (INDEPENDENT_AMBULATORY_CARE_PROVIDER_SITE_OTHER): Payer: Medicare HMO | Admitting: Family Medicine

## 2018-05-07 VITALS — BP 120/60 | HR 63 | Temp 98.5°F | Ht 61.5 in | Wt 155.5 lb

## 2018-05-07 DIAGNOSIS — Z0000 Encounter for general adult medical examination without abnormal findings: Secondary | ICD-10-CM | POA: Diagnosis not present

## 2018-05-07 NOTE — Progress Notes (Signed)
Dr. Frederico Hamman T. Copland, MD, [facility] and Sports Medicine [place], [address] Phone: [phone] Fax: [phone]  05/07/2018  Patient: Taylor Bush, MRN: [id number], DOB: July 14, 1946, 72 y.o.  Primary Physician:  Owens Loffler, MD   Chief Complaint  Patient presents with  . Annual Exam    Part 2   Subjective:   Taylor Bush is a 72 y.o. pleasant patient who presents with the following:  Health Maintenance Summary Reviewed and updated, unless pt declines services.  Tobacco History Reviewed. Non-smoker Alcohol: No concerns, no excessive use Exercise Habits: Some activity, rec at least 30 mins 5 times a week STD concerns: none Drug Use: None Birth control method: n/a Menses regular: yes Lumps or breast concerns: no Breast Cancer Family History: no  Overall doing well.   Health Maintenance  Topic Date Due  . TETANUS/TDAP  04/28/2027 (Originally 07/21/1965)  . INFLUENZA VACCINE  06/07/2018  . MAMMOGRAM  03/07/2020  . COLONOSCOPY  07/11/2022  . DEXA SCAN  Completed  . Hepatitis C Screening  Completed  . PNA vac Low Risk Adult  Completed    Immunization History  Administered Date(s) Administered  . Influenza, High Dose Seasonal PF 08/23/2016, 08/24/2017  . Influenza-Unspecified 07/08/2014, 08/08/2015, 08/08/2015  . Pneumococcal Conjugate-13 03/04/2015  . Pneumococcal Polysaccharide-23 09/05/2012   Patient Active Problem List   Diagnosis Date Noted  . CKD (chronic kidney disease) stage 3, GFR 30-59 ml/min (HCC) 11/29/2013  . Avascular necrosis of left femoral head ([place]) 12/16/2011  . Anemia, iron deficiency 12/16/2011  . Hx of total hip arthroplasty 12/15/2011  . AVN (avascular necrosis of bone) Right Hip 10/04/2011  . ATHEROSCLEROSIS W /INT CLAUDICATION 06/17/2009  . Lumbar back pain with radiculopathy affecting left lower extremity 06/03/2009  . HYPERLIPIDEMIA 12/15/2008  . Essential hypertension 12/15/2008  .  UNSPECIFIED OPEN-ANGLE GLAUCOMA 10/27/2008  . UNSPECIFIED CATARACT 10/27/2008   Past Medical History:  Diagnosis Date  . Allergy   . Anemia   . Arthritis    bilateral hips   . Blood transfusion    11/12   . Cataract   . CKD (chronic kidney disease) stage 3, GFR 30-59 ml/min (HCC) 11/29/2013  . HLD (hyperlipidemia)   . HTN (hypertension)   . Hx of total hip arthroplasty 12/15/2011  . Lumbar back pain    with radiculopathy  . Open-angle glaucoma   . PAD (peripheral artery disease) ([place])    with intermittent claudication, ABI 0.72 on Rt and 0.73 on L when assessed in 2010.    Past Surgical History:  Procedure Laterality Date  . abnormal vascular studies  7/10   ABI's 70, R SFA > 50%^ blockage   . cataract surgery  2/10  . EXCISION OF TONGUE LESION WITH LASER N/A 08/31/2015   Procedure: BIOPSY AND CO2 LASER OF TONGUE LESION ;  Surgeon: Izora Gala, MD;  Location: [facility];  Service: ENT;  Laterality: N/A;  . OTHER SURGICAL HISTORY     ganglion cyst removed left wrist   . TOTAL HIP ARTHROPLASTY  10/03/2011   Procedure: TOTAL HIP ARTHROPLASTY;  Surgeon: Gearlean Alf;  Location: [facility];  Service: Orthopedics;  Laterality: Right;  . TOTAL HIP ARTHROPLASTY  12/21/2011   Procedure: TOTAL HIP ARTHROPLASTY;  Surgeon: Gearlean Alf, MD;  Location: [facility];  Service: Orthopedics;  Laterality: Left;  . TUBAL LIGATION     Social History   Socioeconomic History  . Marital status: Married  Spouse name: Not on file  . Number of children: Not on file  . Years of education: Not on file  . Highest education level: Not on file  Occupational History  . Not on file  Social Needs  . Financial resource strain: Not on file  . Food insecurity:    Worry: Not on file    Inability: Not on file  . Transportation needs:    Medical: Not on file    Non-medical: Not on file  Tobacco Use  . Smoking status: Former Smoker    Last attempt to quit: 11/07/2008    Years since quitting:  9.5  . Smokeless tobacco: Never Used  Substance and Sexual Activity  . Alcohol use: No  . Drug use: No  . Sexual activity: Not on file  Lifestyle  . Physical activity:    Days per week: Not on file    Minutes per session: Not on file  . Stress: Not on file  Relationships  . Social connections:    Talks on phone: Not on file    Gets together: Not on file    Attends religious service: Not on file    Active member of club or organization: Not on file    Attends meetings of clubs or organizations: Not on file    Relationship status: Not on file  . Intimate partner violence:    Fear of current or ex partner: Not on file    Emotionally abused: Not on file    Physically abused: Not on file    Forced sexual activity: Not on file  Other Topics Concern  . 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    Family History  Problem Relation Age of Onset  . Heart attack Father        86s  . Stroke Neg Hx        also no PAD or aneurysm  . Colon cancer Neg Hx   . Esophageal cancer Neg Hx   . Stomach cancer Neg Hx   . Rectal cancer Neg Hx    Allergies  Allergen Reactions  . Prednisone Other (See Comments)    Joint pain,joint stiffness  . Statins Rash    "hands broke out"    Medication list has been reviewed and updated.   General: Denies fever, chills, sweats. No significant weight loss. Eyes: Denies blurring,significant itching ENT: Denies earache, sore throat, and hoarseness.  Cardiovascular: Denies chest pains, palpitations, dyspnea on exertion,  Respiratory: Denies cough, dyspnea at rest,wheeezing Breast: no concerns about lumps GI: Denies nausea, vomiting, diarrhea, constipation, change in bowel habits, abdominal pain, melena, hematochezia GU: Denies dysuria, hematuria, urinary hesitancy, nocturia, denies STD risk, no concerns about discharge Musculoskeletal: Denies back pain, joint pain Derm: Denies rash, itching Neuro:  Denies  paresthesias, frequent falls, frequent headaches Psych: Denies depression, anxiety Endocrine: Denies cold intolerance, heat intolerance, polydipsia Heme: Denies enlarged lymph nodes Allergy: No hayfever  Objective:   BP 120/60   Pulse 63   Temp 98.5 F (36.9 C) (Oral)   Ht 5' 1.5" (1.562 m)   Wt 155 lb 8 oz (70.5 kg)   BMI 28.91 kg/m  No exam data present  GEN: well developed, well nourished, no acute distress Eyes: conjunctiva and lids normal, PERRLA, EOMI ENT: TM clear, nares clear, oral exam WNL Neck: supple, no lymphadenopathy, no thyromegaly, no JVD Pulm: clear to auscultation and percussion, respiratory effort normal CV: regular rate  and rhythm, S1-S2, no murmur, rub or gallop, no bruits Chest: no scars, masses, no lumps BREAST: breast exam declined GI: soft, non-tender; no hepatosplenomegaly, masses; active bowel sounds all quadrants GU: GU exam declined Lymph: no cervical, axillary or inguinal adenopathy MSK: gait normal, muscle tone and strength WNL, no joint swelling, effusions, discoloration, crepitus  SKIN: clear, good turgor, color WNL, no rashes, lesions, or ulcerations Neuro: normal mental status, normal strength, sensation, and motion Psych: alert; oriented to person, place and time, normally interactive and not anxious or depressed in appearance.   All labs reviewed with patient. Lipids:    Component Value Date/Time   CHOL 225 (H) 05/01/2018 1131   TRIG 246.0 (H) 05/01/2018 1131   HDL 46.50 05/01/2018 1131   LDLDIRECT 121.0 05/01/2018 1131   VLDL 49.2 (H) 05/01/2018 1131   CHOLHDL 5 05/01/2018 1131   CBC: CBC Latest Ref Rng & Units 05/01/2018 04/27/2017 02/10/2016  WBC 4.0 - 10.5 K/uL 8.1 8.7 9.3  Hemoglobin 12.0 - 15.0 g/dL 9.5(L) 10.0(L) 9.8(L)  Hematocrit 36.0 - 46.0 % 27.4(L) 29.2(L) 29.2(L)  Platelets 150.0 - 400.0 K/uL 306.0 310.0 607.3    Basic Metabolic Panel:    Component Value Date/Time   NA 134 (L) 05/01/2018 1131   K 5.4 (H)  05/01/2018 1131   CL 104 05/01/2018 1131   CO2 24 05/01/2018 1131   BUN 18 05/01/2018 1131   CREATININE 1.42 (H) 05/01/2018 1131   GLUCOSE 100 (H) 05/01/2018 1131   CALCIUM 9.0 05/01/2018 1131   Hepatic Function Latest Ref Rng & Units 05/01/2018 04/27/2017 02/10/2016  Total Protein 6.0 - 8.3 g/dL 6.6 6.8 6.7  Albumin 3.5 - 5.2 g/dL 4.1 4.2 4.1  AST 0 - 37 U/L _0 ALT 0 - 35 U/L _1 Alk Phosphatase 39 - 117 U/L 65 53 56  Total Bilirubin 0.2 - 1.2 mg/dL 0.3 0.4 0.4  Bilirubin, Direct 0.0 - 0.3 mg/dL 0.1 0.1 0.1    Lab Results  Component Value Date   TSH 2.67 04/27/2017   No results found.  Assessment and Plan:   Healthcare maintenance  Anemia is noted - Colon 07/2017 No known bleeding  Health Maintenance Exam: The patient's preventative maintenance and recommended screening tests for an annual wellness exam were reviewed in full today. Brought up to date unless services declined.  Counselled on the importance of diet, exercise, and its role in overall health and mortality. The patient's FH and SH was reviewed, including their home life, tobacco status, and drug and alcohol status.  Follow-up in 1 year for physical exam or additional follow-up below.  Follow-up: No follow-ups on file. Or follow-up in 1 year if not noted.  Signed,  Maud Deed. Copland, MD   Allergies as of 05/07/2018      Reactions   Prednisone Other (See Comments)   Joint pain,joint stiffness   Statins Rash   "hands broke out"      Medication List        Accurate as of 05/07/18  3:17 PM. Always use your most recent med list.          amLODipine 10 MG tablet Commonly known as:  NORVASC TAKE 1 TABLET (10 MG TOTAL) BY MOUTH DAILY BEFORE BREAKFAST.   aspirin 81 MG tablet Take 81 mg by mouth daily.   ferrous sulfate 325 (65 FE) MG tablet Take 650 mg by mouth daily with breakfast.   lisinopril-hydrochlorothiazide 20-25 MG tablet Commonly known as:  PRINZIDE,ZESTORETIC Take 1 tablet by  mouth daily.   metoprolol 200 MG 24 hr tablet Commonly known as:  TOPROL-XL TAKE 1 TABLET (200 MG TOTAL) BY MOUTH DAILY.   OVER THE COUNTER MEDICATION Calcium 900 mg. One 600 mg tablet and 1/2 of a 600 mg tablet one time a day.   traMADol 50 MG tablet Commonly known as:  ULTRAM TAKE 1 TABLET BY MOUTH EVERY 6 HOURS AS NEEDED   vitamin B-12 1000 MCG tablet Commonly known as:  CYANOCOBALAMIN Take 1,000 mcg by mouth daily.

## 2018-05-28 ENCOUNTER — Other Ambulatory Visit: Payer: Self-pay | Admitting: Family Medicine

## 2018-06-04 ENCOUNTER — Other Ambulatory Visit: Payer: Self-pay | Admitting: Family Medicine

## 2018-06-07 DIAGNOSIS — H35412 Lattice degeneration of retina, left eye: Secondary | ICD-10-CM | POA: Diagnosis not present

## 2018-06-07 DIAGNOSIS — H18413 Arcus senilis, bilateral: Secondary | ICD-10-CM | POA: Diagnosis not present

## 2018-06-07 DIAGNOSIS — H52222 Regular astigmatism, left eye: Secondary | ICD-10-CM | POA: Diagnosis not present

## 2018-06-07 DIAGNOSIS — H524 Presbyopia: Secondary | ICD-10-CM | POA: Diagnosis not present

## 2018-06-07 DIAGNOSIS — Z961 Presence of intraocular lens: Secondary | ICD-10-CM | POA: Diagnosis not present

## 2018-06-07 DIAGNOSIS — H3552 Pigmentary retinal dystrophy: Secondary | ICD-10-CM | POA: Diagnosis not present

## 2018-06-07 DIAGNOSIS — H5211 Myopia, right eye: Secondary | ICD-10-CM | POA: Diagnosis not present

## 2018-06-07 DIAGNOSIS — H353132 Nonexudative age-related macular degeneration, bilateral, intermediate dry stage: Secondary | ICD-10-CM | POA: Diagnosis not present

## 2018-08-14 ENCOUNTER — Telehealth: Payer: Self-pay | Admitting: *Deleted

## 2018-08-14 NOTE — Telephone Encounter (Signed)
If you print one out, I will sign.

## 2018-08-14 NOTE — Telephone Encounter (Signed)
Copied from [place] [id number]. Topic: Inquiry >> Aug 14, 2018  3:07 PM Scherrie Gerlach wrote: Reason for CRM: pt would like you to fill out a handicapp placard form for her.  She was using her husband's but it runs out end of this month. Please call when ready for pick up

## 2018-08-14 NOTE — Telephone Encounter (Signed)
Handicap Placard Application placed in Dr. Lillie Fragmin  in box for signature.

## 2018-08-15 NOTE — Telephone Encounter (Signed)
Taylor Bush notified that her handicap placard application is ready to be picked up at the front desk.

## 2019-01-23 ENCOUNTER — Ambulatory Visit (INDEPENDENT_AMBULATORY_CARE_PROVIDER_SITE_OTHER)
Admission: RE | Admit: 2019-01-23 | Discharge: 2019-01-23 | Disposition: A | Discharge status: Home / Self Care | Payer: Medicare HMO | Source: Ambulatory Visit | Attending: Family Medicine | Admitting: Family Medicine

## 2019-01-23 ENCOUNTER — Ambulatory Visit (INDEPENDENT_AMBULATORY_CARE_PROVIDER_SITE_OTHER): Payer: Medicare HMO | Admitting: Family Medicine

## 2019-01-23 ENCOUNTER — Other Ambulatory Visit: Payer: Self-pay

## 2019-01-23 VITALS — BP 132/62 | HR 61 | Temp 98.2°F | Ht 61.5 in | Wt 156.5 lb

## 2019-01-23 DIAGNOSIS — M79604 Pain in right leg: Secondary | ICD-10-CM

## 2019-01-23 DIAGNOSIS — M545 Low back pain, unspecified: Secondary | ICD-10-CM

## 2019-01-23 DIAGNOSIS — M25552 Pain in left hip: Secondary | ICD-10-CM

## 2019-01-23 MED ORDER — TRAMADOL HCL 50 MG PO TABS: 50.0000 mg | ORAL_TABLET | Freq: Four times a day (QID) | ORAL | 2 refills | Status: DC | PRN

## 2019-01-23 NOTE — Progress Notes (Signed)
Krisandra Bueno T. Refael Fulop, MD Primary Care and [place] [facility] [place], [phone]  FAX: [phone]  Taylor Bush - 73 y.o. female   MRN [id number]   Date of Birth: [birth year]  Visit Date: 01/23/2019   PCP: Owens Loffler, MD   Referred by: Owens Loffler, MD  Chief Complaint  Patient presents with   Back Pain   Hip Pain    Left   Subjective:   Taylor Bush is a 73 y.o. very pleasant female patient who presents with the following:  Pain in the back and l posterior buttocks. Ongoing off an on for 6-7 years, then to the [facility] and could hardly stand there.  Hard time sweeping in the kitchen.  Baseline has been hurting a lot.   Some feet numbness.  L radiclar to the L knee. No bowel or bladder incontinence.  ESI did not seem to help all that much. She has seen Dr. Nelva Bush multiple times in the past.   l-spine and hip xr  Past Medical History, Surgical History, Social History, Family History, Problem List, Medications, and Allergies have been reviewed and updated if relevant.  Patient Active Problem List   Diagnosis Date Noted   CKD (chronic kidney disease) stage 3, GFR 30-59 ml/min (HCC) 11/29/2013   Avascular necrosis of left femoral head ([place]) 12/16/2011   Anemia, iron deficiency 12/16/2011   Hx of total hip arthroplasty 12/15/2011   AVN (avascular necrosis of bone) Right Hip 10/04/2011   ATHEROSCLEROSIS W /INT CLAUDICATION 06/17/2009   Lumbar back pain with radiculopathy affecting left lower extremity 06/03/2009   HYPERLIPIDEMIA 12/15/2008   Essential hypertension 12/15/2008   UNSPECIFIED OPEN-ANGLE GLAUCOMA 10/27/2008   UNSPECIFIED CATARACT 10/27/2008    Past Medical History:  Diagnosis Date   Allergy    Anemia    Arthritis    bilateral hips    Blood transfusion    11/12    Cataract    CKD (chronic kidney disease) stage 3, GFR 30-59 ml/min (HCC) 11/29/2013   HLD  (hyperlipidemia)    HTN (hypertension)    Hx of total hip arthroplasty 12/15/2011   Lumbar back pain    with radiculopathy   Open-angle glaucoma    PAD (peripheral artery disease) (HCC)    with intermittent claudication, ABI 0.72 on Rt and 0.73 on L when assessed in 2010.     Past Surgical History:  Procedure Laterality Date   abnormal vascular studies  7/10   ABI's 70, R SFA > 50%^ blockage    cataract surgery  2/10   EXCISION OF TONGUE LESION WITH LASER N/A 08/31/2015   Procedure: BIOPSY AND CO2 LASER OF TONGUE LESION ;  Surgeon: Izora Gala, MD;  Location: [facility];  Service: ENT;  Laterality: N/A;   OTHER SURGICAL HISTORY     ganglion cyst removed left wrist    TOTAL HIP ARTHROPLASTY  10/03/2011   Procedure: TOTAL HIP ARTHROPLASTY;  Surgeon: Gearlean Alf;  Location: [facility];  Service: Orthopedics;  Laterality: Right;   TOTAL HIP ARTHROPLASTY  12/21/2011   Procedure: TOTAL HIP ARTHROPLASTY;  Surgeon: Gearlean Alf, MD;  Location: [facility];  Service: Orthopedics;  Laterality: Left;   TUBAL LIGATION      Social History   Socioeconomic History   Marital status: Married    Spouse name: Not on file   Number of children: Not on file   Years of education:  Not on file   Highest education level: Not on file  Occupational History   Not on file  Social Needs   Financial resource strain: Not on file   Food insecurity:    Worry: Not on file    Inability: Not on file   Transportation needs:    Medical: Not on file    Non-medical: Not on file  Tobacco Use   Smoking status: Former Smoker    Last attempt to quit: 11/07/2008    Years since quitting: 10.2   Smokeless tobacco: Never Used  Substance and Sexual Activity   Alcohol use: No   Drug use: No   Sexual activity: Not on file  Lifestyle   Physical activity:    Days per week: Not on file    Minutes per session: Not on file   Stress: Not on file  Relationships   Social  connections:    Talks on phone: Not on file    Gets together: Not on file    Attends religious service: Not on file    Active member of club or organization: Not on file    Attends meetings of clubs or organizations: Not on file    Relationship status: Not on file   Intimate partner violence:    Fear of current or ex partner: Not on file    Emotionally abused: Not on file    Physically abused: Not on file    Forced sexual activity: Not on file  Other Topics Concern   Not on file  Social History Narrative   Retired Secretary/administrator, 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     Family History  Problem Relation Age of Onset   Heart attack Father        71s   Stroke Neg Hx        also no PAD or aneurysm   Colon cancer Neg Hx    Esophageal cancer Neg Hx    Stomach cancer Neg Hx    Rectal cancer Neg Hx     Allergies  Allergen Reactions   Prednisone Other (See Comments)    Joint pain,joint stiffness   Statins Rash    "hands broke out"    Medication list reviewed and updated in full in [facility].  GEN: No fevers, chills. Nontoxic. Primarily MSK c/o today. MSK: Detailed in the HPI GI: tolerating PO intake without difficulty Neuro: No numbness, parasthesias, or tingling associated. Otherwise the pertinent positives of the ROS are noted above.   Objective:   BP 132/62    Pulse 61    Temp 98.2 F (36.8 C) (Oral)    Ht 5' 1.5" (1.562 m)    Wt 156 lb 8 oz (71 kg)    BMI 29.09 kg/m    GEN: Well-developed,well-nourished,in no acute distress; alert,appropriate and cooperative throughout examination HEENT: Normocephalic and atraumatic without obvious abnormalities. Ears, externally no deformities PULM: Breathing comfortably in no respiratory distress EXT: No clubbing, cyanosis, or edema PSYCH: Normally interactive. Cooperative during the interview. Pleasant. Friendly and conversant. Not anxious or depressed appearing. Normal, full affect.  Range of motion at  the waist:  modest restriction in all directions  No echymosis or edema Rises to examination table with no difficulty Gait: non antalgic  Inspection/Deformity: N Paraspinus Tenderness: mild l3-s1 b  B Ankle Dorsiflexion (L5,4): 5/5 B Great Toe Dorsiflexion (L5,4): 5/5 Heel Walk (L5): WNL Toe Walk (S1): WNL Rise/Squat (L4): WNL  SENSORY B Medial Foot (L4): WNL B Dorsum (L5):  WNL B Lateral (S1): WNL Light Touch: WNL Pinprick: WNL  REFLEXES Knee (L4): 2+ Ankle (S1): 2+  B SLR, seated: neg B SLR, supine: neg B FABER: neg B Reverse FABER: neg B Greater Troch: NT B Log Roll: neg B Stork: NT B Sciatic Notch: NT   Radiology: Dg Lumbar Spine Complete  Result Date: 01/23/2019 CLINICAL DATA:  Low back pain, left radiculopathy EXAM: LUMBAR SPINE - COMPLETE 4+ VIEW COMPARISON:  12/26/2016 FINDINGS: Slight anterolisthesis of L4 on L5 and L5 on S1, stable since prior study related to degenerative facet disease. Disc space narrowing from L3-4 through L5-S1. No fracture or change since prior study. SI joints symmetric and unremarkable. Aortic atherosclerosis. No visible aneurysm. IMPRESSION: Degenerative disc and facet disease.  No acute bony abnormality. Electronically Signed   By: Rolm Baptise M.D.   On: 01/23/2019 19:22   Dg Hip Unilat With Pelvis 2-3 Views Left  Result Date: 01/23/2019 CLINICAL DATA:  Left hip pain EXAM: DG HIP (WITH OR WITHOUT PELVIS) 2-3V LEFT COMPARISON:  12/26/2016, 12/21/2011 FINDINGS: Bilateral hip replacements noted. Normal alignment. No fracture, subluxation or dislocation. No hardware or bony complicating feature. IMPRESSION: Bilateral hip replacements.  No acute bony abnormality. Electronically Signed   By: Rolm Baptise M.D.   On: 01/23/2019 19:30     Assessment and Plan:   Lumbar pain with radiation down right leg - Plan: DG Lumbar Spine Complete  Left hip pain - Plan: DG HIP UNILAT WITH PELVIS 2-3 VIEWS LEFT  Long-term 7+ years of chronic back pain, failure of  all conservative care.  Hip replacements are stable.  She has already called, and tramadol has not helped at all, so I sent her in some Tylenol #3.  If pain persists, then I will give her a medrol dosepak  Follow-up: No follow-ups on file.  Meds ordered this encounter  Medications   traMADol (ULTRAM) 50 MG tablet    Sig: Take 1 tablet (50 mg total) by mouth every 6 (six) hours as needed.    Dispense:  50 tablet    Refill:  2    Not to exceed 3 additional fills before 10/30/2017   acetaminophen-codeine (TYLENOL #3) 300-30 MG tablet    Sig: 1 tab po every 6 hours as needed for pain    Dispense:  20 tablet    Refill:  0   Orders Placed This Encounter  Procedures   DG Lumbar Spine Complete   DG HIP UNILAT WITH PELVIS 2-3 VIEWS LEFT    Signed,  Amato Sevillano T. Yago Ludvigsen, MD   Outpatient Encounter Medications as of 01/23/2019  Medication Sig   amLODipine (NORVASC) 10 MG tablet TAKE 1 TABLET (10 MG TOTAL) BY MOUTH DAILY BEFORE BREAKFAST.   aspirin 81 MG tablet Take 81 mg by mouth daily.   ferrous sulfate 325 (65 FE) MG tablet Take 650 mg by mouth daily with breakfast.   lisinopril-hydrochlorothiazide (PRINZIDE,ZESTORETIC) 20-25 MG tablet TAKE 1 TABLET EVERY DAY   metoprolol (TOPROL-XL) 200 MG 24 hr tablet TAKE 1 TABLET (200 MG TOTAL) BY MOUTH DAILY.   OVER THE COUNTER MEDICATION Calcium 900 mg. One 600 mg tablet and 1/2 of a 600 mg tablet one time a day.   traMADol (ULTRAM) 50 MG tablet Take 1 tablet (50 mg total) by mouth every 6 (six) hours as needed.   vitamin B-12 (CYANOCOBALAMIN) 1000 MCG tablet Take 1,000 mcg by mouth daily.   [DISCONTINUED] traMADol (ULTRAM) 50 MG tablet TAKE 1 TABLET BY MOUTH EVERY 6  HOURS AS NEEDED   acetaminophen-codeine (TYLENOL #3) 300-30 MG tablet 1 tab po every 6 hours as needed for pain   Facility-Administered Encounter Medications as of 01/23/2019  Medication   0.9 %  sodium chloride infusion

## 2019-01-24 ENCOUNTER — Encounter: Payer: Self-pay | Admitting: Family Medicine

## 2019-01-24 MED ORDER — ACETAMINOPHEN-CODEINE #3 300-30 MG PO TABS: ORAL_TABLET | ORAL | 0 refills | Status: DC

## 2019-03-08 ENCOUNTER — Other Ambulatory Visit: Payer: Self-pay | Admitting: Family Medicine

## 2019-03-11 ENCOUNTER — Telehealth: Payer: Self-pay | Admitting: Family Medicine

## 2019-03-11 NOTE — Telephone Encounter (Signed)
Copied from [place] [id number]. Topic: Quick Communication - See Telephone Encounter >> Mar 11, 2019  4:45 PM Blase Mess A wrote: CRM for notification. See Telephone encounter for: 03/11/19.  Patient is calling because the insurance is wanting the prescriptions faxed to the insurance company. metoprolol (TOPROL-XL) 200 MG 24 hr tablet [[id number]] amLODipine (NORVASC) 10 MG tablet [[id number]]   [employer] [phone]

## 2019-03-12 NOTE — Telephone Encounter (Signed)
Refills on Metoprolol and Amlodipine were sent electronically to [facility] on 03/11/2019.

## 2019-05-06 ENCOUNTER — Other Ambulatory Visit (INDEPENDENT_AMBULATORY_CARE_PROVIDER_SITE_OTHER): Payer: Medicare HMO

## 2019-05-06 ENCOUNTER — Other Ambulatory Visit: Payer: Self-pay

## 2019-05-06 DIAGNOSIS — Z79899 Other long term (current) drug therapy: Secondary | ICD-10-CM | POA: Diagnosis not present

## 2019-05-06 DIAGNOSIS — E559 Vitamin D deficiency, unspecified: Secondary | ICD-10-CM

## 2019-05-06 DIAGNOSIS — E782 Mixed hyperlipidemia: Secondary | ICD-10-CM

## 2019-05-06 DIAGNOSIS — Z131 Encounter for screening for diabetes mellitus: Secondary | ICD-10-CM

## 2019-05-07 LAB — HEPATIC FUNCTION PANEL
ALT: 7 U/L (ref 0–35)
AST: 12 U/L (ref 0–37)
Albumin: 4.2 g/dL (ref 3.5–5.2)
Alkaline Phosphatase: 61 U/L (ref 39–117)
Bilirubin, Direct: 0.1 mg/dL (ref 0.0–0.3)
Total Bilirubin: 0.4 mg/dL (ref 0.2–1.2)
Total Protein: 6.8 g/dL (ref 6.0–8.3)

## 2019-05-07 LAB — LIPID PANEL
Cholesterol: 202 mg/dL — ABNORMAL HIGH (ref 0–200)
HDL: 52.2 mg/dL (ref >39.00)
LDL Cholesterol: 121 mg/dL — ABNORMAL HIGH (ref 0–99)
NonHDL: 149.73
Total CHOL/HDL Ratio: 4
Triglycerides: 144 mg/dL (ref 0.0–149.0)
VLDL: 28.8 mg/dL (ref 0.0–40.0)

## 2019-05-07 LAB — BASIC METABOLIC PANEL
BUN: 26 mg/dL — ABNORMAL HIGH (ref 6–23)
CO2: 24 mEq/L (ref 19–32)
Calcium: 9 mg/dL (ref 8.4–10.5)
Chloride: 97 mEq/L (ref 96–112)
Creatinine, Ser: 1.73 mg/dL — ABNORMAL HIGH (ref 0.40–1.20)
GFR: 28.89 mL/min — ABNORMAL LOW (ref >60.00)
Glucose, Bld: 89 mg/dL (ref 70–99)
Potassium: 5.3 mEq/L — ABNORMAL HIGH (ref 3.5–5.1)
Sodium: 128 mEq/L — ABNORMAL LOW (ref 135–145)

## 2019-05-07 LAB — CBC WITH DIFFERENTIAL/PLATELET
Basophils Absolute: 0.1 10*3/uL (ref 0.0–0.1)
Basophils Relative: 0.9 % (ref 0.0–3.0)
Eosinophils Absolute: 0.3 10*3/uL (ref 0.0–0.7)
Eosinophils Relative: 4.5 % (ref 0.0–5.0)
HCT: 28.1 % — ABNORMAL LOW (ref 36.0–46.0)
Hemoglobin: 9.4 g/dL — ABNORMAL LOW (ref 12.0–15.0)
Lymphocytes Relative: 17.3 % (ref 12.0–46.0)
Lymphs Abs: 1.3 10*3/uL (ref 0.7–4.0)
MCHC: 33.5 g/dL (ref 30.0–36.0)
MCV: 94.8 fl (ref 78.0–100.0)
Monocytes Absolute: 0.9 10*3/uL (ref 0.1–1.0)
Monocytes Relative: 11.4 % (ref 3.0–12.0)
Neutro Abs: 5 10*3/uL (ref 1.4–7.7)
Neutrophils Relative %: 65.9 % (ref 43.0–77.0)
Platelets: 343 10*3/uL (ref 150.0–400.0)
RBC: 2.96 Mil/uL — ABNORMAL LOW (ref 3.87–5.11)
RDW: 14.1 % (ref 11.5–15.5)
WBC: 7.6 10*3/uL (ref 4.0–10.5)

## 2019-05-07 LAB — HEMOGLOBIN A1C: Hgb A1c MFr Bld: 5.8 % (ref 4.6–6.5)

## 2019-05-07 LAB — VITAMIN D 25 HYDROXY (VIT D DEFICIENCY, FRACTURES): VITD: 27.63 ng/mL — ABNORMAL LOW (ref 30.00–100.00)

## 2019-05-07 LAB — TSH: TSH: 2.5 u[IU]/mL (ref 0.35–4.50)

## 2019-05-09 ENCOUNTER — Ambulatory Visit (INDEPENDENT_AMBULATORY_CARE_PROVIDER_SITE_OTHER): Payer: Medicare HMO | Admitting: Family Medicine

## 2019-05-09 ENCOUNTER — Encounter: Payer: Self-pay | Admitting: Family Medicine

## 2019-05-09 ENCOUNTER — Ambulatory Visit: Payer: Medicare HMO

## 2019-05-09 ENCOUNTER — Other Ambulatory Visit: Payer: Self-pay

## 2019-05-09 VITALS — BP 140/64 | HR 65 | Temp 98.1°F | Ht 61.5 in | Wt 158.8 lb

## 2019-05-09 DIAGNOSIS — Z0000 Encounter for general adult medical examination without abnormal findings: Secondary | ICD-10-CM | POA: Diagnosis not present

## 2019-05-09 DIAGNOSIS — N184 Chronic kidney disease, stage 4 (severe): Secondary | ICD-10-CM

## 2019-05-09 NOTE — Patient Instructions (Signed)
Shingrix - get at the pharmacy.

## 2019-05-09 NOTE — Progress Notes (Signed)
Destiny Hagin T. Armanie Ullmer, MD Primary Care and [place] at [facility] [place], [address] Phone: [phone]  FAX: [phone]  Taylor Bush - 73 y.o. female  MRN [id number]  Date of Birth: 09-06-1946  Visit Date: 05/09/2019  PCP: Owens Loffler, MD  Referred by: Owens Loffler, MD  Chief Complaint  Patient presents with  . Medicare Wellness   Patient Care Team: Owens Loffler, MD as PCP - General Subjective:   Taylor Bush is a 73 y.o. pleasant patient who presents for a medicare wellness examination:  Health Maintenance Summary Reviewed and updated, unless pt declines services.  Tobacco History Reviewed. Non-smoker Alcohol: No concerns, no excessive use Exercise Habits: Some activity, rec at least 30 mins 5 times a week STD concerns: none Drug Use: None Birth control method: n/a Menses regular: n/a Lumps or breast concerns: no Breast Cancer Family History: no  Health Maintenance  Topic Date Due  . TETANUS/TDAP  04/28/2027 (Originally 07/21/1965)  . INFLUENZA VACCINE  06/08/2019  . MAMMOGRAM  03/07/2020  . COLONOSCOPY  07/11/2022  . DEXA SCAN  Completed  . Hepatitis C Screening  Completed  . PNA vac Low Risk Adult  Completed   Immunization History  Administered Date(s) Administered  . Influenza, High Dose Seasonal PF 08/23/2016, 08/24/2017  . Influenza-Unspecified 07/08/2014, 08/08/2015, 08/08/2015  . Pneumococcal Conjugate-13 03/04/2015  . Pneumococcal Polysaccharide-23 09/05/2012    Patient Active Problem List   Diagnosis Date Noted  . CKD (chronic kidney disease) stage 3, GFR 30-59 ml/min (HCC) 11/29/2013  . Avascular necrosis of left femoral head ([place]) 12/16/2011  . Anemia, iron deficiency 12/16/2011  . Hx of total hip arthroplasty 12/15/2011  . AVN (avascular necrosis of bone) Right Hip 10/04/2011  . ATHEROSCLEROSIS W /INT CLAUDICATION 06/17/2009  . Lumbar back pain with radiculopathy  affecting left lower extremity 06/03/2009  . HYPERLIPIDEMIA 12/15/2008  . Essential hypertension 12/15/2008  . UNSPECIFIED OPEN-ANGLE GLAUCOMA 10/27/2008  . UNSPECIFIED CATARACT 10/27/2008   Past Medical History:  Diagnosis Date  . Allergy   . Anemia   . Arthritis    bilateral hips   . Blood transfusion    11/12   . Cataract   . CKD (chronic kidney disease) stage 3, GFR 30-59 ml/min (HCC) 11/29/2013  . HLD (hyperlipidemia)   . HTN (hypertension)   . Hx of total hip arthroplasty 12/15/2011  . Lumbar back pain    with radiculopathy  . Open-angle glaucoma   . PAD (peripheral artery disease) ([place])    with intermittent claudication, ABI 0.72 on Rt and 0.73 on L when assessed in 2010.    Past Surgical History:  Procedure Laterality Date  . abnormal vascular studies  7/10   ABI's 70, R SFA > 50%^ blockage   . cataract surgery  2/10  . EXCISION OF TONGUE LESION WITH LASER N/A 08/31/2015   Procedure: BIOPSY AND CO2 LASER OF TONGUE LESION ;  Surgeon: Izora Gala, MD;  Location: [place];  Service: ENT;  Laterality: N/A;  . OTHER SURGICAL HISTORY     ganglion cyst removed left wrist   . TOTAL HIP ARTHROPLASTY  10/03/2011   Procedure: TOTAL HIP ARTHROPLASTY;  Surgeon: Gearlean Alf;  Location: [facility];  Service: Orthopedics;  Laterality: Right;  . TOTAL HIP ARTHROPLASTY  12/21/2011   Procedure: TOTAL HIP ARTHROPLASTY;  Surgeon: Gearlean Alf, MD;  Location: [facility];  Service: Orthopedics;  Laterality: Left;  . TUBAL  LIGATION     Social History   Socioeconomic History  . Marital status: Married    Spouse name: Not on file  . Number of children: Not on file  . Years of education: Not on file  . Highest education level: Not on file  Occupational History  . Not on file  Social Needs  . Financial resource strain: Not on file  . Food insecurity    Worry: Not on file    Inability: Not on file  . Transportation needs    Medical: Not on file    Non-medical: Not on  file  Tobacco Use  . Smoking status: Former Smoker    Quit date: 11/07/2008    Years since quitting: 10.5  . Smokeless tobacco: Never Used  Substance and Sexual Activity  . Alcohol use: No  . Drug use: No  . Sexual activity: Not on file  Lifestyle  . Physical activity    Days per week: Not on file    Minutes per session: Not on file  . Stress: Not on file  Relationships  . Social Herbalist on phone: Not on file    Gets together: Not on file    Attends religious service: Not on file    Active member of club or organization: Not on file    Attends meetings of clubs or organizations: Not on file    Relationship status: Not on file  . Intimate partner violence    Fear of current or ex partner: Not on file    Emotionally abused: Not on file    Physically abused: Not on file    Forced sexual activity: Not on file  Other Topics Concern  . 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    Family History  Problem Relation Age of Onset  . Heart attack Father        24s  . Stroke Neg Hx        also no PAD or aneurysm  . Colon cancer Neg Hx   . Esophageal cancer Neg Hx   . Stomach cancer Neg Hx   . Rectal cancer Neg Hx    Allergies  Allergen Reactions  . Prednisone Other (See Comments)    Joint pain,joint stiffness  . Statins Rash    "hands broke out"    Medication list has been reviewed and updated.  Results for orders placed or performed in visit on 05/06/19  Lipid panel  Result Value Ref Range   Cholesterol 202 (H) 0 - 200 mg/dL   Triglycerides 144.0 0.0 - 149.0 mg/dL   HDL 52.20 >39.00 mg/dL   VLDL 28.8 0.0 - 40.0 mg/dL   LDL Cholesterol 121 (H) 0 - 99 mg/dL   Total CHOL/HDL Ratio 4    NonHDL 149.73   Hemoglobin A1c  Result Value Ref Range   Hgb A1c MFr Bld 5.8 4.6 - 6.5 %  CBC with Differential/Platelet  Result Value Ref Range   WBC 7.6 4.0 - 10.5 K/uL   RBC 2.96 (L) 3.87 - 5.11 Mil/uL   Hemoglobin  9.4 (L) 12.0 - 15.0 g/dL   HCT 28.1 (L) 36.0 - 46.0 %   MCV 94.8 78.0 - 100.0 fl   MCHC 33.5 30.0 - 36.0 g/dL   RDW 14.1 11.5 - 15.5 %   Platelets 343.0 150.0 - 400.0 K/uL   Neutrophils Relative % 65.9 43.0 - 77.0 %  Lymphocytes Relative 17.3 12.0 - 46.0 %   Monocytes Relative 11.4 3.0 - 12.0 %   Eosinophils Relative 4.5 0.0 - 5.0 %   Basophils Relative 0.9 0.0 - 3.0 %   Neutro Abs 5.0 1.4 - 7.7 K/uL   Lymphs Abs 1.3 0.7 - 4.0 K/uL   Monocytes Absolute 0.9 0.1 - 1.0 K/uL   Eosinophils Absolute 0.3 0.0 - 0.7 K/uL   Basophils Absolute 0.1 0.0 - 0.1 K/uL  Hepatic function panel  Result Value Ref Range   Total Bilirubin 0.4 0.2 - 1.2 mg/dL   Bilirubin, Direct 0.1 0.0 - 0.3 mg/dL   Alkaline Phosphatase 61 39 - 117 U/L   AST 12 0 - 37 U/L   ALT 7 0 - 35 U/L   Total Protein 6.8 6.0 - 8.3 g/dL   Albumin 4.2 3.5 - 5.2 g/dL  Basic metabolic panel  Result Value Ref Range   Sodium 128 (L) 135 - 145 mEq/L   Potassium 5.3 (H) 3.5 - 5.1 mEq/L   Chloride 97 96 - 112 mEq/L   CO2 24 19 - 32 mEq/L   Glucose, Bld 89 70 - 99 mg/dL   BUN 26 (H) 6 - 23 mg/dL   Creatinine, Ser 1.73 (H) 0.40 - 1.20 mg/dL   Calcium 9.0 8.4 - 10.5 mg/dL   GFR 28.89 (L) >60.00 mL/min  TSH  Result Value Ref Range   TSH 2.50 0.35 - 4.50 uIU/mL  VITAMIN D 25 Hydroxy (Vit-D Deficiency, Fractures)  Result Value Ref Range   VITD 27.63 (L) 30.00 - 100.00 ng/mL    General: Denies fever, chills, sweats. No significant weight loss. Eyes: Denies blurring,significant itching ENT: Denies earache, sore throat, and hoarseness.  Cardiovascular: Denies chest pains, palpitations, dyspnea on exertion,  Respiratory: Denies cough, dyspnea at rest,wheeezing Breast: no concerns about lumps GI: Denies nausea, vomiting, diarrhea, constipation, change in bowel habits, abdominal pain, melena, hematochezia GU: Denies dysuria, hematuria, urinary hesitancy, nocturia, denies STD risk, no concerns about discharge Musculoskeletal: CHRONIC  BACK PAIN Derm: Denies rash, itching Neuro: Denies  paresthesias, frequent falls, frequent headaches Psych: Denies depression, anxiety Endocrine: Denies cold intolerance, heat intolerance, polydipsia Heme: Denies enlarged lymph nodes Allergy: No hayfever   Objective:   BP 140/64   Pulse 65   Temp 98.1 F (36.7 C) (Oral)   Ht 5' 1.5" (1.562 m)   Wt 158 lb 12 oz (72 kg)   SpO2 98%   BMI 29.51 kg/m  Fall Risk  05/09/2019 05/01/2018 04/27/2017 02/15/2016 03/04/2015  Falls in the past year? 0 No No No No   Ideal Body Weight: Weight in (lb) to have BMI = 25: 134.2  Hearing Screening   Method: Audiometry   '125Hz'  '250Hz'  '500Hz'  '1000Hz'  '2000Hz'  '3000Hz'  '4000Hz'  '6000Hz'  '8000Hz'   Right ear:   '20 20 20  20    ' Left ear:   '20 20 20  20      ' Visual Acuity Screening   Right eye Left eye Both eyes  Without correction:     With correction: 20/100 20/100 20/100   Depression screen [facility] 2/9 05/09/2019 05/01/2018 04/27/2017 02/15/2016 03/04/2015  Decreased Interest 0 0 0 0 0  Down, Depressed, Hopeless 0 0 0 0 0  PHQ - 2 Score 0 0 0 0 0  Altered sleeping - 0 - - -  Tired, decreased energy - 0 - - -  Change in appetite - 0 - - -  Feeling bad or failure about yourself  - 0 - - -  Trouble concentrating - 0 - - -  Moving slowly or fidgety/restless - 0 - - -  Suicidal thoughts - 0 - - -  PHQ-9 Score - 0 - - -  Difficult doing work/chores - Not difficult at all - - -     GEN: well developed, well nourished, no acute distress Eyes: conjunctiva and lids normal, PERRLA, EOMI ENT: TM clear, nares clear, oral exam WNL Neck: supple, no lymphadenopathy, no thyromegaly, no JVD Pulm: clear to auscultation and percussion, respiratory effort normal CV: regular rate and rhythm, S1-S2, no murmur, rub or gallop, no bruits Chest: no scars, masses, no lumps BREAST: breast exam declined GI: soft, non-tender; no hepatosplenomegaly, masses; active bowel sounds all quadrants GU: GU exam declined Lymph: no cervical, axillary or  inguinal adenopathy MSK: gait normal, muscle tone and strength WNL, no joint swelling, effusions, discoloration, crepitus  SKIN: clear, good turgor, color WNL, no rashes, lesions, or ulcerations Neuro: normal mental status, normal strength, sensation, and motion Psych: alert; oriented to person, place and time, normally interactive and not anxious or depressed in appearance.   All labs reviewed with patient. Lipids: Lab Results  Component Value Date   CHOL 202 (H) 05/06/2019   Lab Results  Component Value Date   HDL 52.20 05/06/2019   Lab Results  Component Value Date   LDLCALC 121 (H) 05/06/2019   Lab Results  Component Value Date   TRIG 144.0 05/06/2019   Lab Results  Component Value Date   CHOLHDL 4 05/06/2019   CBC: CBC Latest Ref Rng & Units 05/06/2019 05/01/2018 04/27/2017  WBC 4.0 - 10.5 K/uL 7.6 8.1 8.7  Hemoglobin 12.0 - 15.0 g/dL 9.4(L) 9.5(L) 10.0(L)  Hematocrit 36.0 - 46.0 % 28.1(L) 27.4(L) 29.2(L)  Platelets 150.0 - 400.0 K/uL 343.0 306.0 409.8    Basic Metabolic Panel:    Component Value Date/Time   NA 128 (L) 05/06/2019 1556   K 5.3 (H) 05/06/2019 1556   CL 97 05/06/2019 1556   CO2 24 05/06/2019 1556   BUN 26 (H) 05/06/2019 1556   CREATININE 1.73 (H) 05/06/2019 1556   GLUCOSE 89 05/06/2019 1556   CALCIUM 9.0 05/06/2019 1556   Hepatic Function Latest Ref Rng & Units 05/06/2019 05/01/2018 04/27/2017  Total Protein 6.0 - 8.3 g/dL 6.8 6.6 6.8  Albumin 3.5 - 5.2 g/dL 4.2 4.1 4.2  AST 0 - 37 U/L '12 13 15  ' ALT 0 - 35 U/L '7 9 12  ' Alk Phosphatase 39 - 117 U/L 61 65 53  Total Bilirubin 0.2 - 1.2 mg/dL 0.4 0.3 0.4  Bilirubin, Direct 0.0 - 0.3 mg/dL 0.1 0.1 0.1    Lab Results  Component Value Date   HGBA1C 5.8 05/06/2019   Lab Results  Component Value Date   TSH 2.50 05/06/2019    No results found.  Assessment and Plan:     ICD-10-CM   1. Healthcare maintenance  Z00.00   2. CKD (chronic kidney disease) stage 4, GFR 15-29 ml/min (HCC)  N18.4  Ambulatory referral to Nephrology   Globally doing well with ongoing chronic back pain  CKD and GFR have progressed. She appears to have anemia of chronic disease, and I want to get nephrology involved.  Health Maintenance Exam: The patient's preventative maintenance and recommended screening tests for an annual wellness exam were reviewed in full today. Brought up to date unless services declined.  Counselled on the importance of diet, exercise, and its role in overall health and mortality. The patient's FH and  SH was reviewed, including their home life, tobacco status, and drug and alcohol status.  Follow-up in 1 year for physical exam or additional follow-up below.  I have personally reviewed the Medicare Annual Wellness questionnaire and have noted 1. The patient's medical and social history 2. Their use of alcohol, tobacco or illicit drugs 3. Their current medications and supplements 4. The patient's functional ability including ADL's, fall risks, home safety risks and hearing or visual             impairment. 5. Diet and physical activities 6. Evidence for depression or mood disorders 7. Reviewed Updated provider list, see scanned forms and [facility] Snapshot.  8. Reviewed whether or not the patient has HCPOA or living will, and discussed what this means with the patient.  Recommended she bring in a copy for his chart in [facility].  The patients weight, height, BMI and visual acuity have been recorded in the chart I have made referrals, counseling and provided education to the patient based review of the above and I have provided the pt with a written personalized care plan for preventive services.  I have provided the patient with a copy of your personalized plan for preventive services. Instructed to take the time to review along with their updated medication list.  Follow-up: No follow-ups on file. Or follow-up in 1 year unless noted.  No orders of the defined types were placed in this  encounter.  There are no discontinued medications. Orders Placed This Encounter  Procedures  . Ambulatory referral to Nephrology    Signed,  Taylor Hamman T. Alberta Cairns, MD   Allergies as of 05/09/2019      Reactions   Prednisone Other (See Comments)   Joint pain,joint stiffness   Statins Rash   "hands broke out"      Medication List       Accurate as of May 09, 2019 11:59 PM. If you have any questions, ask your nurse or doctor.        acetaminophen-codeine 300-30 MG tablet Commonly known as: TYLENOL #3 1 tab po every 6 hours as needed for pain   amLODipine 10 MG tablet Commonly known as: NORVASC TAKE 1 TABLET EVERY DAY BEFORE BREAKFAST   aspirin 81 MG tablet Take 81 mg by mouth daily.   ferrous sulfate 325 (65 FE) MG tablet Take 650 mg by mouth daily with breakfast.   lisinopril-hydrochlorothiazide 20-25 MG tablet Commonly known as: ZESTORETIC TAKE 1 TABLET EVERY DAY   metoprolol 200 MG 24 hr tablet Commonly known as: TOPROL-XL TAKE 1 TABLET EVERY DAY   OVER THE COUNTER MEDICATION Calcium 900 mg. One 600 mg tablet and 1/2 of a 600 mg tablet one time a day.   traMADol 50 MG tablet Commonly known as: ULTRAM Take 1 tablet (50 mg total) by mouth every 6 (six) hours as needed.   vitamin B-12 1000 MCG tablet Commonly known as: CYANOCOBALAMIN Take 1,000 mcg by mouth daily.

## 2019-06-10 DIAGNOSIS — N184 Chronic kidney disease, stage 4 (severe): Secondary | ICD-10-CM | POA: Diagnosis not present

## 2019-06-10 DIAGNOSIS — I129 Hypertensive chronic kidney disease with stage 1 through stage 4 chronic kidney disease, or unspecified chronic kidney disease: Secondary | ICD-10-CM | POA: Diagnosis not present

## 2019-06-10 DIAGNOSIS — E871 Hypo-osmolality and hyponatremia: Secondary | ICD-10-CM | POA: Diagnosis not present

## 2019-06-10 DIAGNOSIS — E875 Hyperkalemia: Secondary | ICD-10-CM | POA: Diagnosis not present

## 2019-06-12 ENCOUNTER — Other Ambulatory Visit: Payer: Self-pay | Admitting: Family Medicine

## 2019-06-17 DIAGNOSIS — H3552 Pigmentary retinal dystrophy: Secondary | ICD-10-CM | POA: Diagnosis not present

## 2019-06-26 ENCOUNTER — Other Ambulatory Visit: Payer: Self-pay | Admitting: Nephrology

## 2019-06-26 DIAGNOSIS — N184 Chronic kidney disease, stage 4 (severe): Secondary | ICD-10-CM

## 2019-07-06 ENCOUNTER — Other Ambulatory Visit: Payer: Self-pay | Admitting: Family Medicine

## 2019-07-08 ENCOUNTER — Other Ambulatory Visit: Payer: Self-pay

## 2019-07-08 ENCOUNTER — Ambulatory Visit
Admission: RE | Admit: 2019-07-08 | Discharge: 2019-07-08 | Disposition: A | Discharge status: Home / Self Care | Payer: Medicare HMO | Source: Ambulatory Visit | Attending: Nephrology | Admitting: Nephrology

## 2019-07-08 DIAGNOSIS — N189 Chronic kidney disease, unspecified: Secondary | ICD-10-CM | POA: Diagnosis not present

## 2019-07-08 DIAGNOSIS — N184 Chronic kidney disease, stage 4 (severe): Secondary | ICD-10-CM | POA: Insufficient documentation

## 2019-07-11 DIAGNOSIS — E871 Hypo-osmolality and hyponatremia: Secondary | ICD-10-CM | POA: Diagnosis not present

## 2019-07-11 DIAGNOSIS — N179 Acute kidney failure, unspecified: Secondary | ICD-10-CM | POA: Diagnosis not present

## 2019-07-11 DIAGNOSIS — R600 Localized edema: Secondary | ICD-10-CM | POA: Diagnosis not present

## 2019-07-11 DIAGNOSIS — I10 Essential (primary) hypertension: Secondary | ICD-10-CM | POA: Diagnosis not present

## 2019-07-11 DIAGNOSIS — N183 Chronic kidney disease, stage 3 (moderate): Secondary | ICD-10-CM | POA: Diagnosis not present

## 2019-08-15 DIAGNOSIS — E871 Hypo-osmolality and hyponatremia: Secondary | ICD-10-CM | POA: Diagnosis not present

## 2019-08-15 DIAGNOSIS — E875 Hyperkalemia: Secondary | ICD-10-CM | POA: Diagnosis not present

## 2019-08-15 DIAGNOSIS — I129 Hypertensive chronic kidney disease with stage 1 through stage 4 chronic kidney disease, or unspecified chronic kidney disease: Secondary | ICD-10-CM | POA: Diagnosis not present

## 2019-08-15 DIAGNOSIS — N184 Chronic kidney disease, stage 4 (severe): Secondary | ICD-10-CM | POA: Diagnosis not present

## 2019-08-20 DIAGNOSIS — R600 Localized edema: Secondary | ICD-10-CM | POA: Diagnosis not present

## 2019-08-20 DIAGNOSIS — I10 Essential (primary) hypertension: Secondary | ICD-10-CM | POA: Diagnosis not present

## 2019-08-20 DIAGNOSIS — N1832 Chronic kidney disease, stage 3b: Secondary | ICD-10-CM | POA: Diagnosis not present

## 2019-08-20 DIAGNOSIS — N179 Acute kidney failure, unspecified: Secondary | ICD-10-CM | POA: Diagnosis not present

## 2019-08-20 DIAGNOSIS — N2581 Secondary hyperparathyroidism of renal origin: Secondary | ICD-10-CM | POA: Diagnosis not present

## 2019-09-19 DIAGNOSIS — N1832 Chronic kidney disease, stage 3b: Secondary | ICD-10-CM | POA: Diagnosis not present

## 2019-09-19 DIAGNOSIS — N179 Acute kidney failure, unspecified: Secondary | ICD-10-CM | POA: Diagnosis not present

## 2019-09-19 DIAGNOSIS — I10 Essential (primary) hypertension: Secondary | ICD-10-CM | POA: Diagnosis not present

## 2019-11-29 ENCOUNTER — Other Ambulatory Visit: Payer: Self-pay | Admitting: Family Medicine

## 2019-11-29 NOTE — Telephone Encounter (Signed)
Patient is calling in regards to her Tramadol   She stated she was prescribed this for her back and would like to know if it is possible to get a refill on this     [facility] [address]

## 2019-11-29 NOTE — Telephone Encounter (Signed)
Last office visit 05/09/2019 for CPE.  Last refilled 01/23/2019 for #50 with 2 refills.  No future appointments.

## 2019-11-30 MED ORDER — TRAMADOL HCL 50 MG PO TABS: 50.0000 mg | ORAL_TABLET | Freq: Four times a day (QID) | ORAL | 2 refills | Status: DC | PRN

## 2019-12-18 ENCOUNTER — Other Ambulatory Visit: Payer: Self-pay | Admitting: Family Medicine

## 2020-01-20 DIAGNOSIS — N184 Chronic kidney disease, stage 4 (severe): Secondary | ICD-10-CM | POA: Diagnosis not present

## 2020-01-20 DIAGNOSIS — D631 Anemia in chronic kidney disease: Secondary | ICD-10-CM | POA: Diagnosis not present

## 2020-01-20 DIAGNOSIS — I10 Essential (primary) hypertension: Secondary | ICD-10-CM | POA: Diagnosis not present

## 2020-01-20 DIAGNOSIS — E871 Hypo-osmolality and hyponatremia: Secondary | ICD-10-CM | POA: Diagnosis not present

## 2020-01-20 DIAGNOSIS — N2581 Secondary hyperparathyroidism of renal origin: Secondary | ICD-10-CM | POA: Diagnosis not present

## 2020-01-23 ENCOUNTER — Other Ambulatory Visit: Payer: Self-pay | Admitting: *Deleted

## 2020-01-23 MED ORDER — TRAMADOL HCL 50 MG PO TABS: 50.0000 mg | ORAL_TABLET | Freq: Four times a day (QID) | ORAL | 2 refills | Status: DC | PRN

## 2020-01-23 NOTE — Telephone Encounter (Signed)
Last office visit 05/09/2019 for CPE.  Last refilled 11/30/2019 for #[facility]  Refill request today is coming from [facility].  No future appointments.  Ok to refill?

## 2020-02-11 DIAGNOSIS — N184 Chronic kidney disease, stage 4 (severe): Secondary | ICD-10-CM | POA: Diagnosis not present

## 2020-04-13 DIAGNOSIS — Z1231 Encounter for screening mammogram for malignant neoplasm of breast: Secondary | ICD-10-CM | POA: Diagnosis not present

## 2020-04-14 ENCOUNTER — Telehealth: Payer: Self-pay | Admitting: Family Medicine

## 2020-04-14 DIAGNOSIS — K1370 Unspecified lesions of oral mucosa: Secondary | ICD-10-CM

## 2020-04-14 NOTE — Telephone Encounter (Signed)
Patient called today requesting a referral to Dr Constance Holster, Ent She stated that she was in the dentist and they found a spot in her mouth that they advised for her to be seen by an ENT for further eval.  She would like to know if you are able to send referral for her

## 2020-04-15 NOTE — Telephone Encounter (Signed)
done

## 2020-04-15 NOTE — Telephone Encounter (Signed)
Appt Dr Constance Holster 05/15/20 at 1:15pm. Pattient notified.

## 2020-05-06 ENCOUNTER — Ambulatory Visit (INDEPENDENT_AMBULATORY_CARE_PROVIDER_SITE_OTHER)
Admission: RE | Admit: 2020-05-06 | Discharge: 2020-05-06 | Disposition: A | Discharge status: Home / Self Care | Payer: Medicare HMO | Source: Ambulatory Visit | Attending: Family Medicine | Admitting: Family Medicine

## 2020-05-06 ENCOUNTER — Encounter: Payer: Self-pay | Admitting: Family Medicine

## 2020-05-06 ENCOUNTER — Other Ambulatory Visit: Payer: Self-pay

## 2020-05-06 ENCOUNTER — Ambulatory Visit (INDEPENDENT_AMBULATORY_CARE_PROVIDER_SITE_OTHER): Payer: Medicare HMO | Admitting: Family Medicine

## 2020-05-06 VITALS — BP 140/76 | HR 61 | Temp 98.2°F | Ht 61.5 in | Wt 156.5 lb

## 2020-05-06 DIAGNOSIS — Z96642 Presence of left artificial hip joint: Secondary | ICD-10-CM

## 2020-05-06 DIAGNOSIS — M25552 Pain in left hip: Secondary | ICD-10-CM

## 2020-05-06 DIAGNOSIS — W19XXXA Unspecified fall, initial encounter: Secondary | ICD-10-CM

## 2020-05-06 DIAGNOSIS — M533 Sacrococcygeal disorders, not elsewhere classified: Secondary | ICD-10-CM | POA: Diagnosis not present

## 2020-05-06 DIAGNOSIS — S79912A Unspecified injury of left hip, initial encounter: Secondary | ICD-10-CM | POA: Diagnosis not present

## 2020-05-06 DIAGNOSIS — I708 Atherosclerosis of other arteries: Secondary | ICD-10-CM | POA: Diagnosis not present

## 2020-05-06 MED ORDER — ACETAMINOPHEN-CODEINE #3 300-30 MG PO TABS: 1.0000 | ORAL_TABLET | Freq: Four times a day (QID) | ORAL | 0 refills | Status: DC | PRN

## 2020-05-06 NOTE — Progress Notes (Signed)
Taylor Petite T. Ganon Demasi, MD, [place] at [facility] [place], [address]  Phone: [phone]  FAX: [phone]  Taylor Bush - 74 y.o. female  MRN [id number]  Date of Birth: 04/18/1946  Date: 05/06/2020  PCP: Owens Loffler, MD  Referral: Owens Loffler, MD  Chief Complaint  Patient presents with  . Hip Pain    Left  . Fall    Yesterday-Fell on Asphalt      This visit occurred during the SARS-CoV-2 public health emergency.  Safety protocols were in place, including screening questions prior to the visit, additional usage of staff PPE, and extensive cleaning of exam room while observing appropriate contact time as indicated for disinfecting solutions.   Subjective:   Taylor Bush is a 74 y.o. very pleasant female patient with Body mass index is 29.09 kg/m. who presents with the following:  She went with her son yesterday to drive him to his colonoscopy, and when she was leaving she slipped on the pavement and landed on her posterior hip.  History is significant for bilateral total hip arthroplasty.  Since that time she has had some significant pain throughout the left hip, and she has had to use a walker to get around.  At baseline she does not use any form of assistive device.  She has not had any significant bruising or swelling and she does not have any focal pain although she does have pain throughout the anterior of the hip as well as the posterior aspect of the pelvis and hip.  Went with her sun yesterday, slipped on the pavement and hit her hip.    Walking at home some.  Had to use a walker at home.   Review of Systems is noted in the HPI, as appropriate   Objective:   BP 140/76   Pulse 61   Temp 98.2 F (36.8 C) (Temporal)   Ht 5' 1.5" (1.562 m)   Wt 156 lb 8 oz (71 kg)   SpO2 97%   BMI 29.09 kg/m    GEN: No acute distress; alert,appropriate. PULM: Breathing  comfortably in no respiratory distress PSYCH: Normally interactive.    The patient is sitting in a wheelchair.  I did not remove her from the wheelchair, and this does limit the exam somewhat.  She is not have any pain throughout the thigh, she has some tenderness on the greater trochanter as well as posterior along the posterior pelvis.  There does not appear to be any tenderness on the superior or inferior pubic rami.  Strength in hip flexion, abduction and abduction appears intact.  She does have grossly full range of motion at the hip.  Radiology: DG Hip Unilat W OR W/O Pelvis Min 4 Views Left  Result Date: 05/06/2020 CLINICAL DATA:  Fall, significant hip pain, prior hip replacement EXAM: DG HIP (WITH OR WITHOUT PELVIS) 4+V LEFT COMPARISON:  01/23/2019 FINDINGS: Osseous demineralization. BILATERAL hip prostheses identified. Symmetric SI joints. Pelvis intact. No acute fracture, dislocation or bone destruction. No periprosthetic lucency. Atherosclerotic calcifications at the iliac arteries bilaterally. IMPRESSION: Osseous demineralization and BILATERAL hip prostheses. No acute abnormalities. Electronically Signed   By: Lavonia Dana M.D.   On: 05/06/2020 13:16     Assessment and Plan:     ICD-10-CM   1. Acute hip pain, left  M25.552 DG Hip Unilat W OR W/O Pelvis Min 4 Views Left  2. History of  left hip replacement  Z96.642 DG Hip Unilat W OR W/O Pelvis Min 4 Views Left  3. Fall, initial encounter  W19.Merril Abbe DG Hip Unilat W OR W/O Pelvis Min 4 Views Left   Acute left-sided hip pain secondary to fall.  No evidence of acute fracture.  Continue to use her walker at home and be careful not to fall again.  I would anticipate that this would resolve after 3 to 4 weeks.  Tylenol 3 for pain  Follow-up: No follow-ups on file.  Meds ordered this encounter  Medications  . acetaminophen-codeine (TYLENOL #3) 300-30 MG tablet    Sig: Take 1 tablet by mouth every 6 (six) hours as needed for moderate  pain.    Dispense:  20 tablet    Refill:  0   Medications Discontinued During This Encounter  Medication Reason  . ferrous sulfate 325 (65 FE) MG tablet Completed Course  . lisinopril-hydrochlorothiazide (ZESTORETIC) 20-25 MG tablet Change in therapy  . acetaminophen-codeine (TYLENOL #3) 300-30 MG tablet    Orders Placed This Encounter  Procedures  . DG Hip Unilat W OR W/O Pelvis Min 4 Views Left    Signed,  Frederico Hamman T. Aslyn Cottman, MD   Outpatient Encounter Medications as of 05/06/2020  Medication Sig  . amLODipine (NORVASC) 10 MG tablet TAKE 1 TABLET EVERY DAY BEFORE BREAKFAST  . aspirin 81 MG tablet Take 81 mg by mouth daily.  . Cholecalciferol 50 MCG (2000 UT) CAPS Take by mouth.  Marland Kitchen lisinopril (ZESTRIL) 20 MG tablet Take by mouth.  . metoprolol (TOPROL-XL) 200 MG 24 hr tablet TAKE 1 TABLET EVERY DAY  . OVER THE COUNTER MEDICATION Calcium 900 mg. One 600 mg tablet and 1/2 of a 600 mg tablet one time a day.  . traMADol (ULTRAM) 50 MG tablet Take 1 tablet (50 mg total) by mouth every 6 (six) hours as needed.  . vitamin B-12 (CYANOCOBALAMIN) 1000 MCG tablet Take 1,000 mcg by mouth daily.  . [DISCONTINUED] acetaminophen-codeine (TYLENOL #3) 300-30 MG tablet 1 tab po every 6 hours as needed for pain  . acetaminophen-codeine (TYLENOL #3) 300-30 MG tablet Take 1 tablet by mouth every 6 (six) hours as needed for moderate pain.  . [DISCONTINUED] ferrous sulfate 325 (65 FE) MG tablet Take 650 mg by mouth daily with breakfast.  . [DISCONTINUED] lisinopril-hydrochlorothiazide (ZESTORETIC) 20-25 MG tablet TAKE 1 TABLET EVERY DAY   Facility-Administered Encounter Medications as of 05/06/2020  Medication  . 0.9 %  sodium chloride infusion

## 2020-05-12 ENCOUNTER — Other Ambulatory Visit: Payer: Self-pay | Admitting: Family Medicine

## 2020-05-12 NOTE — Telephone Encounter (Signed)
Pt called to request a refill on her [facility] [place]. Any questions or concerns, she can be reached at [phone].

## 2020-05-12 NOTE — Telephone Encounter (Signed)
Last office visit 05/06/2020 for hip pain.  Last refilled 05/06/2020 for #20 with no refills  No future appointments.

## 2020-05-13 MED ORDER — ACETAMINOPHEN-CODEINE #3 300-30 MG PO TABS: 1.0000 | ORAL_TABLET | Freq: Four times a day (QID) | ORAL | 0 refills | Status: DC | PRN

## 2020-06-11 DIAGNOSIS — K1321 Leukoplakia of oral mucosa, including tongue: Secondary | ICD-10-CM | POA: Diagnosis not present

## 2020-06-23 ENCOUNTER — Other Ambulatory Visit: Payer: Self-pay

## 2020-06-23 ENCOUNTER — Encounter (HOSPITAL_BASED_OUTPATIENT_CLINIC_OR_DEPARTMENT_OTHER): Payer: Self-pay | Admitting: Otolaryngology

## 2020-06-25 ENCOUNTER — Encounter (HOSPITAL_BASED_OUTPATIENT_CLINIC_OR_DEPARTMENT_OTHER)
Admission: RE | Admit: 2020-06-25 | Discharge: 2020-06-25 | Disposition: A | Discharge status: Home / Self Care | Payer: Medicare HMO | Source: Ambulatory Visit | Attending: Otolaryngology | Admitting: Otolaryngology

## 2020-06-25 ENCOUNTER — Other Ambulatory Visit (HOSPITAL_COMMUNITY)
Admission: RE | Admit: 2020-06-25 | Discharge: 2020-06-25 | Disposition: A | Discharge status: Home / Self Care | Payer: Medicare HMO | Source: Ambulatory Visit | Attending: Otolaryngology | Admitting: Otolaryngology

## 2020-06-25 DIAGNOSIS — I129 Hypertensive chronic kidney disease with stage 1 through stage 4 chronic kidney disease, or unspecified chronic kidney disease: Secondary | ICD-10-CM | POA: Diagnosis not present

## 2020-06-25 DIAGNOSIS — Z87891 Personal history of nicotine dependence: Secondary | ICD-10-CM | POA: Diagnosis not present

## 2020-06-25 DIAGNOSIS — Z20822 Contact with and (suspected) exposure to covid-19: Secondary | ICD-10-CM | POA: Insufficient documentation

## 2020-06-25 DIAGNOSIS — Z01812 Encounter for preprocedural laboratory examination: Secondary | ICD-10-CM | POA: Insufficient documentation

## 2020-06-25 DIAGNOSIS — Z79899 Other long term (current) drug therapy: Secondary | ICD-10-CM | POA: Diagnosis not present

## 2020-06-25 DIAGNOSIS — K140 Glossitis: Secondary | ICD-10-CM | POA: Diagnosis not present

## 2020-06-25 DIAGNOSIS — K149 Disease of tongue, unspecified: Secondary | ICD-10-CM | POA: Diagnosis present

## 2020-06-25 DIAGNOSIS — N183 Chronic kidney disease, stage 3 unspecified: Secondary | ICD-10-CM | POA: Diagnosis not present

## 2020-06-25 DIAGNOSIS — I739 Peripheral vascular disease, unspecified: Secondary | ICD-10-CM | POA: Diagnosis not present

## 2020-06-25 LAB — BASIC METABOLIC PANEL
Anion gap: 9 (ref 5–15)
BUN: 18 mg/dL (ref 8–23)
CO2: 17 mmol/L — ABNORMAL LOW (ref 22–32)
Calcium: 8.9 mg/dL (ref 8.9–10.3)
Chloride: 107 mmol/L (ref 98–111)
Creatinine, Ser: 1.57 mg/dL — ABNORMAL HIGH (ref 0.44–1.00)
GFR calc Af Amer: 38 mL/min — ABNORMAL LOW (ref >60)
GFR calc non Af Amer: 32 mL/min — ABNORMAL LOW (ref >60)
Glucose, Bld: 124 mg/dL — ABNORMAL HIGH (ref 70–99)
Potassium: 4.7 mmol/L (ref 3.5–5.1)
Sodium: 133 mmol/L — ABNORMAL LOW (ref 135–145)

## 2020-06-25 LAB — SARS CORONAVIRUS 2 (TAT 6-24 HRS): SARS Coronavirus 2: NEGATIVE

## 2020-06-26 ENCOUNTER — Other Ambulatory Visit: Payer: Self-pay | Admitting: Family Medicine

## 2020-06-26 MED ORDER — TRAMADOL HCL 50 MG PO TABS: 50.0000 mg | ORAL_TABLET | Freq: Four times a day (QID) | ORAL | 2 refills | Status: DC | PRN

## 2020-06-26 NOTE — Telephone Encounter (Signed)
Pt needs a new prescription for [facility]adol [facility]

## 2020-06-26 NOTE — Telephone Encounter (Signed)
Last office visit 05/06/2020 for acute left hip pain.   Last refilled 01/23/2020 for #50 with 2 refills.  No future appointments.

## 2020-06-27 ENCOUNTER — Telehealth: Payer: Self-pay | Admitting: Family Medicine

## 2020-06-28 NOTE — H&P (Signed)
HPI:   Taylor Bush is a 74 y.o. female who presents as a consult Patient.   Referring Provider: Rupert Stacks*  Chief complaint: Tongue lesion.  HPI: About 3-1/2 months ago her dentist noticed some abnormal lining along the posterior lateral oral tongue on the left side. She was post come in here sooner but she fell and hurt herself and had to reschedule. It was sore for a while but it is not any longer. But it has not gone away. She does not smoke or drink.  PMH/Meds/All/SocHx/FamHx/ROS:   Past Medical History:  Diagnosis Date  . Hypertension  . Oral leukoplakia   Past Surgical History:  Procedure Laterality Date  . HIP SURGERY   No family history of bleeding disorders, wound healing problems or difficulty with anesthesia.   Social History   Socioeconomic History  . Marital status: Married  Spouse name: Not on file  . Number of children: Not on file  . Years of education: Not on file  . Highest education level: Not on file  Occupational History  . Not on file  Tobacco Use  . Smoking status: Former Research scientist (life sciences)  . Smokeless tobacco: Never Used  Substance and Sexual Activity  . Alcohol use: Not on file  . Drug use: Not on file  . Sexual activity: Not on file  Other Topics Concern  . Not on file  Social History Narrative  . Not on file   Social Determinants of Health   Financial Resource Strain:  . Difficulty of Paying Living Expenses:  Food Insecurity:  . Worried About Charity fundraiser in the Last Year:  . Arboriculturist in the Last Year:  Transportation Needs:  . Film/video editor (Medical):  Marland Kitchen Lack of Transportation (Non-Medical):  Physical Activity:  . Days of Exercise per Week:  . Minutes of Exercise per Session:  Stress:  . Feeling of Stress :  Social Connections:  . Frequency of Communication with Friends and Family:  . Frequency of Social Gatherings with Friends and Family:  . Attends Religious Services:  . Active Member of Clubs or  Organizations:  . Attends Archivist Meetings:  Marland Kitchen Marital Status:   Current Outpatient Medications:  . acetaminophen-codeine (TYLENOL #3) 300-30 mg per tablet, TAKE 1 TABLET BY MOUTH EVERY 6 HOURS AS NEEDED FOR MODERATE PAIN., Disp: , Rfl:  . amLODIPine (NORVASC) 10 MG tablet, TAKE 1 TABLET (10 MG TOTAL) BY MOUTH DAILY BEFORE BREAKFAST., Disp: , Rfl:  . cholecalciferol, vitamin D3, 50 mcg (2,000 unit) Cap, Take by mouth., Disp: , Rfl:  . cyanocobalamin (VITAMIN B12) 1000 MCG tablet, Take 1,000 mcg by mouth., Disp: , Rfl:  . lisinopriL (PRINIVIL,ZESTRIL) 20 MG tablet, , Disp: , Rfl:  . metoprolol (TOPROL-XL) 200 MG 24 hr tablet, TAKE 1 TABLET (200 MG TOTAL) BY MOUTH DAILY., Disp: , Rfl:  . traMADoL (ULTRAM) 50 mg tablet, Take 50 mg by mouth., Disp: , Rfl:   A complete ROS was performed with pertinent positives/negatives noted in the HPI. The remainder of the ROS are negative.   Physical Exam:   BP 151/81  Pulse 66  Temp 97.4 F (36.3 C)  Ht 1.575 m (5\' 2" )  Wt 69.4 kg (153 lb)  BMI 27.98 kg/m   General: Healthy and alert, in no distress, breathing easily. Normal affect. In a pleasant mood. Head: Normocephalic, atraumatic. No masses, or scars. Eyes: Pupils are equal, and reactive to light. Vision is grossly intact. No spontaneous or gaze  nystagmus. Ears: Ear canals are clear. Tympanic membranes are intact, with normal landmarks and the middle ears are clear and healthy. Hearing: Grossly normal. Nose: Nasal cavities are clear with healthy mucosa, no polyps or exudate. Airways are patent. Face: No masses or scars, facial nerve function is symmetric. Oral Cavity: No mucosal abnormalities are noted, except for a 2 cm area of patchy leukoplakia with some central erythroplakia and very slight superficial ulceration along the left posterior lateral oral tongue. Tongue with normal mobility. Dentition appears healthy. Oropharynx: Tonsils are symmetric. There are no mucosal masses  identified. Tongue base appears normal and healthy. Larynx/Hypopharynx: deferred Chest: Deferred Neck: No palpable masses, no cervical adenopathy, no thyroid nodules or enlargement. Neuro: Cranial nerves II-XII with normal function. Balance: Normal gate. Other findings: none.  Independent Review of Additional Tests or Records:  none  Procedures:  none  Impression & Plans:  Leukoplakia left posterior lateral oral tongue with some erythematous changes and very superficial ulceration. Recommend biopsy under anesthesia with laser excision of the entire lesion.

## 2020-06-29 ENCOUNTER — Ambulatory Visit (HOSPITAL_BASED_OUTPATIENT_CLINIC_OR_DEPARTMENT_OTHER)
Admission: RE | Admit: 2020-06-29 | Discharge: 2020-06-29 | Disposition: A | Discharge status: Home / Self Care | Payer: Medicare HMO | Attending: Otolaryngology | Admitting: Otolaryngology

## 2020-06-29 ENCOUNTER — Ambulatory Visit (HOSPITAL_BASED_OUTPATIENT_CLINIC_OR_DEPARTMENT_OTHER): Payer: Medicare HMO | Admitting: Anesthesiology

## 2020-06-29 ENCOUNTER — Other Ambulatory Visit: Payer: Self-pay

## 2020-06-29 ENCOUNTER — Encounter (HOSPITAL_BASED_OUTPATIENT_CLINIC_OR_DEPARTMENT_OTHER): Payer: Self-pay | Admitting: Otolaryngology

## 2020-06-29 ENCOUNTER — Encounter (HOSPITAL_BASED_OUTPATIENT_CLINIC_OR_DEPARTMENT_OTHER)
Admission: RE | Disposition: A | Discharge status: Home / Self Care | Payer: Self-pay | Source: Home / Self Care | Attending: Otolaryngology

## 2020-06-29 DIAGNOSIS — I129 Hypertensive chronic kidney disease with stage 1 through stage 4 chronic kidney disease, or unspecified chronic kidney disease: Secondary | ICD-10-CM | POA: Insufficient documentation

## 2020-06-29 DIAGNOSIS — N183 Chronic kidney disease, stage 3 unspecified: Secondary | ICD-10-CM | POA: Insufficient documentation

## 2020-06-29 DIAGNOSIS — Z87891 Personal history of nicotine dependence: Secondary | ICD-10-CM | POA: Diagnosis not present

## 2020-06-29 DIAGNOSIS — I739 Peripheral vascular disease, unspecified: Secondary | ICD-10-CM | POA: Diagnosis not present

## 2020-06-29 DIAGNOSIS — K148 Other diseases of tongue: Secondary | ICD-10-CM | POA: Diagnosis not present

## 2020-06-29 DIAGNOSIS — Z79899 Other long term (current) drug therapy: Secondary | ICD-10-CM | POA: Diagnosis not present

## 2020-06-29 DIAGNOSIS — K1321 Leukoplakia of oral mucosa, including tongue: Secondary | ICD-10-CM | POA: Diagnosis not present

## 2020-06-29 DIAGNOSIS — K140 Glossitis: Secondary | ICD-10-CM | POA: Insufficient documentation

## 2020-06-29 DIAGNOSIS — E785 Hyperlipidemia, unspecified: Secondary | ICD-10-CM | POA: Diagnosis not present

## 2020-06-29 HISTORY — PX: EXCISION OF TONGUE LESION WITH LASER: SHX5823

## 2020-06-29 SURGERY — EXCISION, LESION, TONGUE, USING LASER
Anesthesia: General | Site: Mouth | Laterality: Left

## 2020-06-29 MED ORDER — PROPOFOL 10 MG/ML IV BOLUS
INTRAVENOUS | Status: DC | PRN
  Administered 2020-06-29: 100 mg via INTRAVENOUS

## 2020-06-29 MED ORDER — FENTANYL CITRATE (PF) 100 MCG/2ML IJ SOLN
INTRAMUSCULAR | Status: AC
  Filled 2020-06-29: qty 2

## 2020-06-29 MED ORDER — ONDANSETRON HCL 4 MG/2ML IJ SOLN
INTRAMUSCULAR | Status: DC | PRN
  Administered 2020-06-29: 4 mg via INTRAVENOUS

## 2020-06-29 MED ORDER — ONDANSETRON HCL 4 MG/2ML IJ SOLN: 4.0000 mg | Freq: Once | INTRAMUSCULAR | Status: DC | PRN

## 2020-06-29 MED ORDER — PROPOFOL 10 MG/ML IV BOLUS
INTRAVENOUS | Status: AC
  Filled 2020-06-29: qty 20

## 2020-06-29 MED ORDER — DEXAMETHASONE SODIUM PHOSPHATE 10 MG/ML IJ SOLN
INTRAMUSCULAR | Status: AC
  Filled 2020-06-29: qty 1

## 2020-06-29 MED ORDER — ACETAMINOPHEN 500 MG PO TABS
1000.0000 mg | ORAL_TABLET | Freq: Once | ORAL | Status: AC
  Administered 2020-06-29: 1000 mg via ORAL

## 2020-06-29 MED ORDER — SUGAMMADEX SODIUM 200 MG/2ML IV SOLN
INTRAVENOUS | Status: DC | PRN
  Administered 2020-06-29: 200 mg via INTRAVENOUS

## 2020-06-29 MED ORDER — ROCURONIUM BROMIDE 100 MG/10ML IV SOLN
INTRAVENOUS | Status: DC | PRN
  Administered 2020-06-29: 30 mg via INTRAVENOUS

## 2020-06-29 MED ORDER — ONDANSETRON HCL 4 MG/2ML IJ SOLN
INTRAMUSCULAR | Status: AC
  Filled 2020-06-29: qty 2

## 2020-06-29 MED ORDER — LIDOCAINE-EPINEPHRINE 1 %-1:100000 IJ SOLN
INTRAMUSCULAR | Status: DC | PRN
  Administered 2020-06-29: 2 mL

## 2020-06-29 MED ORDER — ACETAMINOPHEN 500 MG PO TABS
ORAL_TABLET | ORAL | Status: AC
  Filled 2020-06-29: qty 2

## 2020-06-29 MED ORDER — FENTANYL CITRATE (PF) 100 MCG/2ML IJ SOLN
INTRAMUSCULAR | Status: DC | PRN
Start: 2020-06-29 — End: 2020-06-29
  Administered 2020-06-29 (×2): 50 ug via INTRAVENOUS

## 2020-06-29 MED ORDER — FENTANYL CITRATE (PF) 100 MCG/2ML IJ SOLN: 25.0000 ug | INTRAMUSCULAR | Status: DC | PRN

## 2020-06-29 MED ORDER — LIDOCAINE HCL (CARDIAC) PF 100 MG/5ML IV SOSY
PREFILLED_SYRINGE | INTRAVENOUS | Status: DC | PRN
  Administered 2020-06-29: 60 mg via INTRAVENOUS

## 2020-06-29 MED ORDER — ROCURONIUM BROMIDE 10 MG/ML (PF) SYRINGE
PREFILLED_SYRINGE | INTRAVENOUS | Status: AC
  Filled 2020-06-29: qty 10

## 2020-06-29 MED ORDER — LACTATED RINGERS IV SOLN: INTRAVENOUS | Status: DC

## 2020-06-29 SURGICAL SUPPLY — 41 items
BNDG EYE OVAL (GAUZE/BANDAGES/DRESSINGS) ×6 IMPLANT
CANISTER SUCT 1200ML W/VALVE (MISCELLANEOUS) ×3 IMPLANT
COAGULATOR SUCT 6 FR SWTCH (ELECTROSURGICAL)
COAGULATOR SUCT SWTCH 10FR 6 (ELECTROSURGICAL) IMPLANT
COVER MAYO STAND STRL (DRAPES) ×3 IMPLANT
COVER WAND RF STERILE (DRAPES) IMPLANT
DEPRESSOR TONGUE BLADE STERILE (MISCELLANEOUS) ×3 IMPLANT
ELECT COATED BLADE 2.86 ST (ELECTRODE) IMPLANT
ELECT REM PT RETURN 9FT ADLT (ELECTROSURGICAL)
ELECTRODE REM PT RTRN 9FT ADLT (ELECTROSURGICAL) IMPLANT
FILTER 7/8 IN (FILTER) ×1 IMPLANT
GAUZE SPONGE 4X4 12PLY STRL LF (GAUZE/BANDAGES/DRESSINGS) IMPLANT
GLOVE BIO SURGEON STRL SZ7 (GLOVE) ×2 IMPLANT
GLOVE ECLIPSE 7.5 STRL STRAW (GLOVE) ×3 IMPLANT
GOWN STRL REUS W/ TWL LRG LVL3 (GOWN DISPOSABLE) IMPLANT
GOWN STRL REUS W/ TWL XL LVL3 (GOWN DISPOSABLE) ×2 IMPLANT
GOWN STRL REUS W/TWL LRG LVL3 (GOWN DISPOSABLE)
GOWN STRL REUS W/TWL XL LVL3 (GOWN DISPOSABLE) ×6
MARKER SKIN DUAL TIP RULER LAB (MISCELLANEOUS) IMPLANT
NDL PRECISIONGLIDE 27X1.5 (NEEDLE) ×1 IMPLANT
NEEDLE HYPO 30GX1 BEV (NEEDLE) IMPLANT
NEEDLE PRECISIONGLIDE 27X1.5 (NEEDLE) ×3 IMPLANT
NS IRRIG 1000ML POUR BTL (IV SOLUTION) ×3 IMPLANT
PACK BASIN DAY SURGERY FS (CUSTOM PROCEDURE TRAY) ×2 IMPLANT
PATTIES SURGICAL .5 X3 (DISPOSABLE) IMPLANT
PENCIL FOOT CONTROL (ELECTRODE) IMPLANT
PUNCH BIOPSY DERMAL 3MM (MISCELLANEOUS) ×2 IMPLANT
REDUCTION FITTING 1/4 IN (FILTER) IMPLANT
SHEET MEDIUM DRAPE 40X70 STRL (DRAPES) ×3 IMPLANT
SLEEVE SCD COMPRESS KNEE MED (MISCELLANEOUS) ×3 IMPLANT
SUCTION FRAZIER HANDLE 10FR (MISCELLANEOUS) ×3
SUCTION TUBE FRAZIER 10FR DISP (MISCELLANEOUS) IMPLANT
SUT SILK 3 0 PS 1 (SUTURE) IMPLANT
SUT VIC AB 3-0 SH 27 (SUTURE)
SUT VIC AB 3-0 SH 27X BRD (SUTURE) IMPLANT
SYR BULB EAR ULCER 3OZ GRN STR (SYRINGE) IMPLANT
SYR CONTROL 10ML LL (SYRINGE) ×2 IMPLANT
TOWEL GREEN STERILE FF (TOWEL DISPOSABLE) ×3 IMPLANT
TUBE CONNECTING 20'X1/4 (TUBING) ×1
TUBE CONNECTING 20X1/4 (TUBING) ×2 IMPLANT
YANKAUER SUCT BULB TIP NO VENT (SUCTIONS) ×3 IMPLANT

## 2020-06-29 NOTE — Telephone Encounter (Signed)
Please schedule MWV with nurse and CPE with Dr. Lorelei Pont.   Patient is currently in hospital so you may want to wait and call her next week.

## 2020-06-29 NOTE — Op Note (Signed)
OPERATIVE REPORT  DATE OF SURGERY: 06/29/2020  PATIENT:  Taylor Bush,  74 y.o. female  PRE-OPERATIVE DIAGNOSIS:  tongue lesion  POST-OPERATIVE DIAGNOSIS:  tongue lesion  PROCEDURE:  Procedure(s): EXCISION OF TONGUE LESION/CO2 laser w/biopsy  SURGEON:  Beckie Salts, MD  ASSISTANTS: None  ANESTHESIA:   General   EBL: 10 ml  DRAINS: None  LOCAL MEDICATIONS USED: 1% Xylocaine with epinephrine  SPECIMEN: Left posterior tongue lesion biopsy  COUNTS:  Correct  PROCEDURE DETAILS: The patient was taken to the operating room and placed on the operating table in the supine position. Following induction of general endotracheal anesthesia, the face was prepped and draped in a standard fashion.  Wet eye pads were used and wet towels around the face for laser safety.  A bite block was used to keep the jaws opened.  The left posterior tongue area was identified and local anesthetic was infiltrated.  There is a small superficial ulceration.  Part of this was biopsied with a 4 mm dermal punch.  This was sent for pathologic valuation.  The surrounding leukoplakia was laser ablated with CO2 laser at a setting of 2 W continuous power.  All of the abnormal mucosal surface was denuded.  A total of approximately 1 x 2 cm area was treated.  There was minimal bleeding.  Patient was awakened extubated and transferred to recovery in stable condition.    PATIENT DISPOSITION:  To PACU, stable

## 2020-06-29 NOTE — Transfer of Care (Signed)
Immediate Anesthesia Transfer of Care Note  Patient: Taylor Bush  Procedure(s) Performed: EXCISION OF TONGUE LESION/CO2 laser w/biopsy (Left Mouth)  Patient Location: PACU  Anesthesia Type:General  Level of Consciousness: sedated  Airway & Oxygen Therapy: Patient Spontanous Breathing and Patient connected to face mask oxygen  Post-op Assessment: Report given to RN and Post -op Vital signs reviewed and stable  Post vital signs: Reviewed and stable  Last Vitals:  Vitals Value Taken Time  BP 132/52 06/29/20 1036  Temp    Pulse 66 06/29/20 1039  Resp 17 06/29/20 1039  SpO2 99 % 06/29/20 1039  Vitals shown include unvalidated device data.  Last Pain:  Vitals:   06/29/20 0836  TempSrc: Oral  PainSc: 0-No pain         Complications: No complications documented.

## 2020-06-29 NOTE — Discharge Instructions (Signed)
Resume diet as tolerated.  Rinse mouth with salt water 3 times daily.  Tylenol/Motrin okay for discomfort.   NO TYLENOL PRODUCTS UNTIL 2:30 PM   Post Anesthesia Home Care Instructions  Activity: Get plenty of rest for the remainder of the day. A responsible individual must stay with you for 24 hours following the procedure.  For the next 24 hours, DO NOT: -Drive a car -Paediatric nurse -Drink alcoholic beverages -Take any medication unless instructed by your physician -Make any legal decisions or sign important papers.  Meals: Start with liquid foods such as gelatin or soup. Progress to regular foods as tolerated. Avoid greasy, spicy, heavy foods. If nausea and/or vomiting occur, drink only clear liquids until the nausea and/or vomiting subsides. Call your physician if vomiting continues.  Special Instructions/Symptoms: Your throat may feel dry or sore from the anesthesia or the breathing tube placed in your throat during surgery. If this causes discomfort, gargle with warm salt water. The discomfort should disappear within 24 hours.  If you had a scopolamine patch placed behind your ear for the management of post- operative nausea and/or vomiting:  1. The medication in the patch is effective for 72 hours, after which it should be removed.  Wrap patch in a tissue and discard in the trash. Wash hands thoroughly with soap and water. 2. You may remove the patch earlier than 72 hours if you experience unpleasant side effects which may include dry mouth, dizziness or visual disturbances. 3. Avoid touching the patch. Wash your hands with soap and water after contact with the patch.

## 2020-06-29 NOTE — Anesthesia Preprocedure Evaluation (Addendum)
Anesthesia Evaluation  Patient identified by MRN, date of birth, ID band Patient awake    Reviewed: Allergy & Precautions, NPO status , Patient's Chart, lab work & pertinent test results  Airway Mallampati: III  TM Distance: >3 FB Neck ROM: Full    Dental no notable dental hx.    Pulmonary former smoker,    Pulmonary exam normal breath sounds clear to auscultation       Cardiovascular hypertension, Pt. on medications and Pt. on home beta blockers + Peripheral Vascular Disease  Normal cardiovascular exam Rhythm:Regular Rate:Normal  ECG: NSR, rate 65   Neuro/Psych negative neurological ROS  negative psych ROS   GI/Hepatic negative GI ROS, Neg liver ROS,   Endo/Other  negative endocrine ROS  Renal/GU Renal InsufficiencyRenal diseaseCKD (chronic kidney disease) stage 3, GFR 30-59 ml/min     Musculoskeletal Lumbar back pain   Abdominal   Peds  Hematology HLD   Anesthesia Other Findings tongue lesion  Reproductive/Obstetrics                            Anesthesia Physical Anesthesia Plan  ASA: II  Anesthesia Plan: General   Post-op Pain Management:    Induction: Intravenous  PONV Risk Score and Plan: 4 or greater and Ondansetron, Dexamethasone, Propofol infusion and Treatment may vary due to age or medical condition  Airway Management Planned: Oral ETT  Additional Equipment:   Intra-op Plan:   Post-operative Plan: Extubation in OR  Informed Consent: I have reviewed the patients History and Physical, chart, labs and discussed the procedure including the risks, benefits and alternatives for the proposed anesthesia with the patient or authorized representative who has indicated his/her understanding and acceptance.     Dental advisory given  Plan Discussed with: CRNA  Anesthesia Plan Comments:         Anesthesia Quick Evaluation

## 2020-06-29 NOTE — Anesthesia Postprocedure Evaluation (Signed)
Anesthesia Post Note  Patient: Taylor Bush  Procedure(s) Performed: EXCISION OF TONGUE LESION/CO2 laser w/biopsy (Left Mouth)     Patient location during evaluation: PACU Anesthesia Type: General Level of consciousness: awake Pain management: pain level controlled Vital Signs Assessment: post-procedure vital signs reviewed and stable Respiratory status: spontaneous breathing, nonlabored ventilation, respiratory function stable and patient connected to nasal cannula oxygen Cardiovascular status: blood pressure returned to baseline and stable Postop Assessment: no apparent nausea or vomiting Anesthetic complications: no   No complications documented.  Last Vitals:  Vitals:   06/29/20 1100 06/29/20 1114  BP:  (!) 143/67  Pulse: 63 68  Resp: 14 16  Temp:  37.1 C  SpO2: 99% 98%    Last Pain:  Vitals:   06/29/20 1114  TempSrc:   PainSc: 0-No pain                 Shyan Scalisi P Armaan Pond

## 2020-06-29 NOTE — Anesthesia Procedure Notes (Signed)
Procedure Name: Intubation Date/Time: 06/29/2020 10:07 AM Performed by: Maryella Shivers, CRNA Pre-anesthesia Checklist: Patient identified, Emergency Drugs available, Suction available and Patient being monitored Patient Re-evaluated:Patient Re-evaluated prior to induction Oxygen Delivery Method: Circle system utilized Preoxygenation: Pre-oxygenation with 100% oxygen Induction Type: IV induction Ventilation: Mask ventilation without difficulty Laryngoscope Size: Mac and 4 Grade View: Grade I Tube type: Oral Laser Tube: Laser Tube and Cuffed inflated with minimal occlusive pressure - saline Tube size: 6.0 mm Number of attempts: 1 Airway Equipment and Method: Oral airway Placement Confirmation: ETT inserted through vocal cords under direct vision,  positive ETCO2 and breath sounds checked- equal and bilateral Tube secured with: Tape Dental Injury: Teeth and Oropharynx as per pre-operative assessment

## 2020-06-29 NOTE — Interval H&P Note (Signed)
History and Physical Interval Note:  06/29/2020 9:45 AM  [name]  has presented today for surgery, with the diagnosis of tongue lesion.  The various methods of treatment have been discussed with the patient and family. After consideration of risks, benefits and other options for treatment, the patient has consented to  Procedure(s): EXCISION OF TONGUE LESION/CO2 laser w/biopsy (Left) as a surgical intervention.  The patient's history has been reviewed, patient examined, no change in status, stable for surgery.  I have reviewed the patient's chart and labs.  Questions were answered to the patient's satisfaction.     Taylor Bush

## 2020-06-30 ENCOUNTER — Other Ambulatory Visit: Payer: Self-pay

## 2020-06-30 ENCOUNTER — Encounter (HOSPITAL_BASED_OUTPATIENT_CLINIC_OR_DEPARTMENT_OTHER): Payer: Self-pay | Admitting: Otolaryngology

## 2020-07-01 LAB — SURGICAL PATHOLOGY

## 2020-07-07 DIAGNOSIS — N1832 Chronic kidney disease, stage 3b: Secondary | ICD-10-CM | POA: Diagnosis not present

## 2020-07-07 DIAGNOSIS — N2581 Secondary hyperparathyroidism of renal origin: Secondary | ICD-10-CM | POA: Diagnosis not present

## 2020-07-07 DIAGNOSIS — E871 Hypo-osmolality and hyponatremia: Secondary | ICD-10-CM | POA: Diagnosis not present

## 2020-07-07 DIAGNOSIS — I10 Essential (primary) hypertension: Secondary | ICD-10-CM | POA: Diagnosis not present

## 2020-07-23 NOTE — Telephone Encounter (Signed)
Medicare wellness/labs 10/19 cpx 10/25 Pt aware

## 2020-08-24 ENCOUNTER — Other Ambulatory Visit: Payer: Self-pay | Admitting: Family Medicine

## 2020-08-24 DIAGNOSIS — E559 Vitamin D deficiency, unspecified: Secondary | ICD-10-CM

## 2020-08-24 DIAGNOSIS — Z79899 Other long term (current) drug therapy: Secondary | ICD-10-CM

## 2020-08-24 DIAGNOSIS — D509 Iron deficiency anemia, unspecified: Secondary | ICD-10-CM

## 2020-08-24 DIAGNOSIS — E538 Deficiency of other specified B group vitamins: Secondary | ICD-10-CM

## 2020-08-24 DIAGNOSIS — E785 Hyperlipidemia, unspecified: Secondary | ICD-10-CM

## 2020-08-25 ENCOUNTER — Other Ambulatory Visit (INDEPENDENT_AMBULATORY_CARE_PROVIDER_SITE_OTHER): Payer: Medicare HMO

## 2020-08-25 ENCOUNTER — Other Ambulatory Visit: Payer: Self-pay

## 2020-08-25 ENCOUNTER — Ambulatory Visit: Payer: Medicare HMO

## 2020-08-25 DIAGNOSIS — E538 Deficiency of other specified B group vitamins: Secondary | ICD-10-CM | POA: Diagnosis not present

## 2020-08-25 DIAGNOSIS — E559 Vitamin D deficiency, unspecified: Secondary | ICD-10-CM | POA: Diagnosis not present

## 2020-08-25 DIAGNOSIS — D509 Iron deficiency anemia, unspecified: Secondary | ICD-10-CM

## 2020-08-25 DIAGNOSIS — Z79899 Other long term (current) drug therapy: Secondary | ICD-10-CM

## 2020-08-25 DIAGNOSIS — E785 Hyperlipidemia, unspecified: Secondary | ICD-10-CM | POA: Diagnosis not present

## 2020-08-25 LAB — IBC + FERRITIN
Ferritin: 462.5 ng/mL — ABNORMAL HIGH (ref 10.0–291.0)
Iron: 55 ug/dL (ref 42–145)
Saturation Ratios: 17.2 % — ABNORMAL LOW (ref 20.0–50.0)
Transferrin: 229 mg/dL (ref 212.0–360.0)

## 2020-08-25 LAB — LIPID PANEL
Cholesterol: 212 mg/dL — ABNORMAL HIGH (ref 0–200)
HDL: 57.3 mg/dL (ref >39.00)
LDL Cholesterol: 127 mg/dL — ABNORMAL HIGH (ref 0–99)
NonHDL: 154.3
Total CHOL/HDL Ratio: 4
Triglycerides: 137 mg/dL (ref 0.0–149.0)
VLDL: 27.4 mg/dL (ref 0.0–40.0)

## 2020-08-25 LAB — HEPATIC FUNCTION PANEL
ALT: 9 U/L (ref 0–35)
AST: 13 U/L (ref 0–37)
Albumin: 4.1 g/dL (ref 3.5–5.2)
Alkaline Phosphatase: 97 U/L (ref 39–117)
Bilirubin, Direct: 0.1 mg/dL (ref 0.0–0.3)
Total Bilirubin: 0.4 mg/dL (ref 0.2–1.2)
Total Protein: 6.9 g/dL (ref 6.0–8.3)

## 2020-08-25 LAB — BASIC METABOLIC PANEL
BUN: 25 mg/dL — ABNORMAL HIGH (ref 6–23)
CO2: 27 mEq/L (ref 19–32)
Calcium: 9 mg/dL (ref 8.4–10.5)
Chloride: 102 mEq/L (ref 96–112)
Creatinine, Ser: 1.45 mg/dL — ABNORMAL HIGH (ref 0.40–1.20)
GFR: 35.36 mL/min — ABNORMAL LOW (ref >60.00)
Glucose, Bld: 93 mg/dL (ref 70–99)
Potassium: 4.8 mEq/L (ref 3.5–5.1)
Sodium: 136 mEq/L (ref 135–145)

## 2020-08-25 LAB — TSH: TSH: 3.15 u[IU]/mL (ref 0.35–4.50)

## 2020-08-25 LAB — CBC WITH DIFFERENTIAL/PLATELET
Basophils Absolute: 0.1 10*3/uL (ref 0.0–0.1)
Basophils Relative: 0.9 % (ref 0.0–3.0)
Eosinophils Absolute: 0.4 10*3/uL (ref 0.0–0.7)
Eosinophils Relative: 3.8 % (ref 0.0–5.0)
HCT: 29.5 % — ABNORMAL LOW (ref 36.0–46.0)
Hemoglobin: 9.7 g/dL — ABNORMAL LOW (ref 12.0–15.0)
Lymphocytes Relative: 17.8 % (ref 12.0–46.0)
Lymphs Abs: 1.8 10*3/uL (ref 0.7–4.0)
MCHC: 33.1 g/dL (ref 30.0–36.0)
MCV: 95.2 fl (ref 78.0–100.0)
Monocytes Absolute: 1 10*3/uL (ref 0.1–1.0)
Monocytes Relative: 10.1 % (ref 3.0–12.0)
Neutro Abs: 6.7 10*3/uL (ref 1.4–7.7)
Neutrophils Relative %: 67.4 % (ref 43.0–77.0)
Platelets: 343 10*3/uL (ref 150.0–400.0)
RBC: 3.1 Mil/uL — ABNORMAL LOW (ref 3.87–5.11)
RDW: 14.6 % (ref 11.5–15.5)
WBC: 9.9 10*3/uL (ref 4.0–10.5)

## 2020-08-25 LAB — VITAMIN D 25 HYDROXY (VIT D DEFICIENCY, FRACTURES): VITD: 29.5 ng/mL — ABNORMAL LOW (ref 30.00–100.00)

## 2020-08-25 LAB — VITAMIN B12: Vitamin B-12: 257 pg/mL (ref 211–911)

## 2020-08-31 ENCOUNTER — Encounter: Payer: Self-pay | Admitting: Family Medicine

## 2020-08-31 ENCOUNTER — Other Ambulatory Visit: Payer: Self-pay

## 2020-08-31 ENCOUNTER — Ambulatory Visit (INDEPENDENT_AMBULATORY_CARE_PROVIDER_SITE_OTHER): Payer: Medicare HMO | Admitting: Family Medicine

## 2020-08-31 VITALS — BP 150/60 | HR 74 | Temp 98.0°F | Ht 61.25 in | Wt 151.8 lb

## 2020-08-31 DIAGNOSIS — Z0000 Encounter for general adult medical examination without abnormal findings: Secondary | ICD-10-CM | POA: Diagnosis not present

## 2020-08-31 NOTE — Patient Instructions (Addendum)
Start taking b-12 over the counter, 1,000 micrograms a day  Vitamin D:  Make sure you are taking 4,000 units a day  Get you Covid-19 vaccine booster

## 2020-08-31 NOTE — Progress Notes (Signed)
Neylan Koroma T. Demitrios Molyneux, MD, [place]  [facility] and [place] at [facility] [place], [address]  Phone: [phone]  FAX: [phone]  Taylor Bush - 74 y.o. female  MRN [id number]  Date of Birth: Nov 29, 1945  Date: 08/31/2020  PCP: Owens Loffler, MD  Referral: Owens Loffler, MD  Chief Complaint  Patient presents with  . Medicare Wellness    This visit occurred during the SARS-CoV-2 public health emergency.  Safety protocols were in place, including screening questions prior to the visit, additional usage of staff PPE, and extensive cleaning of exam room while observing appropriate contact time as indicated for disinfecting solutions.   Patient Care Team: Owens Loffler, MD as PCP - General Subjective:   Taylor Bush is a 74 y.o. pleasant patient who presents for a medicare wellness examination:  Health Maintenance Summary Reviewed and updated, unless pt declines services.  Tobacco History Reviewed. Non-smoker Alcohol: No concerns, no excessive use Exercise Habits: Some activity, rec at least 30 mins 5 times a week, she does do some walking. STD concerns: none Drug Use: None Birth control method: n/a Menses regular: n/a Lumps or breast concerns: no Breast Cancer Family History: no  Globally, she is up-to-date on all preventative healthcare.  She is due for her Covid #3 vaccine.  Health Maintenance  Topic Date Due  . TETANUS/TDAP  04/28/2027 (Originally 07/21/1965)  . MAMMOGRAM  04/13/2022  . COLONOSCOPY  07/11/2022  . INFLUENZA VACCINE  Completed  . DEXA SCAN  Completed  . COVID-19 Vaccine  Completed  . Hepatitis C Screening  Completed  . PNA vac Low Risk Adult  Completed   Immunization History  Administered Date(s) Administered  . Influenza, High Dose Seasonal PF 08/23/2016, 08/24/2017, 08/16/2018, 08/11/2020  . Influenza-Unspecified 07/08/2014, 08/08/2015, 08/08/2015  . PFIZER  SARS-COV-2 Vaccination 01/01/2020, 01/22/2020  . Pneumococcal Conjugate-13 03/04/2015  . Pneumococcal Polysaccharide-23 09/05/2012  . Zoster Recombinat (Shingrix) 08/05/2019, 12/03/2019    Patient Active Problem List   Diagnosis Date Noted  . Allergic rhinitis   . CKD (chronic kidney disease) stage 3, GFR 30-59 ml/min (HCC) 11/29/2013  . Avascular necrosis of left femoral head ([place]) 12/16/2011  . Anemia, iron deficiency 12/16/2011  . Hx of total hip arthroplasty 12/15/2011  . AVN (avascular necrosis of bone) Right Hip 10/04/2011  . ATHEROSCLEROSIS W /INT CLAUDICATION 06/17/2009  . Lumbar back pain with radiculopathy affecting left lower extremity 06/03/2009  . HYPERLIPIDEMIA 12/15/2008  . Essential hypertension 12/15/2008  . UNSPECIFIED OPEN-ANGLE GLAUCOMA 10/27/2008    Past Medical History:  Diagnosis Date  . Allergic rhinitis   . Blood transfusion    11/12   . CKD (chronic kidney disease) stage 3, GFR 30-59 ml/min (HCC) 11/29/2013  . HLD (hyperlipidemia)   . HTN (hypertension)   . Hx of total hip arthroplasty 12/15/2011  . Iron deficiency anemia   . Lumbar back pain    with radiculopathy  . Open-angle glaucoma   . PAD (peripheral artery disease) ([place])    with intermittent claudication, ABI 0.72 on Rt and 0.73 on L when assessed in 2010.     Past Surgical History:  Procedure Laterality Date  . abnormal vascular studies  7/10   ABI's 70, R SFA > 50%^ blockage   . cataract surgery  2/10  . EXCISION OF TONGUE LESION WITH LASER N/A 08/31/2015   Procedure: BIOPSY AND CO2 LASER OF TONGUE LESION ;  Surgeon: Izora Gala, MD;  Location:  [place];  Service: ENT;  Laterality: N/A;  . EXCISION OF TONGUE LESION WITH LASER Left 06/29/2020   Procedure: EXCISION OF TONGUE LESION/CO2 laser w/biopsy;  Surgeon: Izora Gala, MD;  Location: [place];  Service: ENT;  Laterality: Left;  . OTHER SURGICAL HISTORY     ganglion cyst removed left wrist   . TOTAL  HIP ARTHROPLASTY  10/03/2011   Procedure: TOTAL HIP ARTHROPLASTY;  Surgeon: Gearlean Alf;  Location: [facility];  Service: Orthopedics;  Laterality: Right;  . TOTAL HIP ARTHROPLASTY  12/21/2011   Procedure: TOTAL HIP ARTHROPLASTY;  Surgeon: Gearlean Alf, MD;  Location: [facility];  Service: Orthopedics;  Laterality: Left;  . TUBAL LIGATION      Family History  Problem Relation Age of Onset  . Heart attack Father        16s  . Stroke Neg Hx        also no PAD or aneurysm  . Colon cancer Neg Hx   . Esophageal cancer Neg Hx   . Stomach cancer Neg Hx   . Rectal cancer Neg Hx     Past Medical History, Surgical History, Social History, Family History, Problem List, Medications, and Allergies have been reviewed and updated if relevant.  Review of Systems: Pertinent positives are listed above.  Otherwise, a full 14 point review of systems has been done in full and it is negative except where it is noted positive.  Objective:   BP (!) 150/60   Pulse 74   Temp 98 F (36.7 C) (Temporal)   Ht 5' 1.25" (1.556 m)   Wt 151 lb 12 oz (68.8 kg)   SpO2 94%   BMI 28.44 kg/m  Fall Risk  08/31/2020 05/09/2019 05/01/2018 04/27/2017 02/15/2016  Falls in the past year? 1 0 No No No  Number falls in past yr: 0 - - - -  Injury with Fall? 1 - - - -   Ideal Body Weight: Weight in (lb) to have BMI = 25: 133.1  Hearing Screening   Method: Audiometry   '125Hz'  '250Hz'  '500Hz'  '1000Hz'  '2000Hz'  '3000Hz'  '4000Hz'  '6000Hz'  '8000Hz'   Right ear:   0 40 20  25    Left ear:   '25 25 20  25    ' Vision Screening Comments: Wears Glasses-Eye Exam scheduled with Heather [place] 09/2020 Depression screen [facility] 2/9 08/31/2020 05/09/2019 05/01/2018 04/27/2017 02/15/2016  Decreased Interest 0 0 0 0 0  Down, Depressed, Hopeless 0 0 0 0 0  PHQ - 2 Score 0 0 0 0 0  Altered sleeping - - 0 - -  Tired, decreased energy - - 0 - -  Change in appetite - - 0 - -  Feeling bad or failure about yourself  - - 0 - -  Trouble concentrating - - 0 - -  Moving  slowly or fidgety/restless - - 0 - -  Suicidal thoughts - - 0 - -  PHQ-9 Score - - 0 - -  Difficult doing work/chores - - Not difficult at all - -     GEN: well developed, well nourished, no acute distress Eyes: conjunctiva and lids normal, PERRLA, EOMI ENT: TM clear, nares clear, oral exam WNL Neck: supple, no lymphadenopathy, no thyromegaly, no JVD Pulm: clear to auscultation and percussion, respiratory effort normal CV: regular rate and rhythm, S1-S2, no murmur, rub or gallop, no bruits Chest: no scars, masses, no lumps BREAST: breast exam declined GI: soft, non-tender; no hepatosplenomegaly, masses; active bowel  sounds all quadrants GU: GU exam declined Lymph: no cervical, axillary or inguinal adenopathy MSK: gait normal, muscle tone and strength WNL, no joint swelling, effusions, discoloration, crepitus  SKIN: clear, good turgor, color WNL, no rashes, lesions, or ulcerations Neuro: normal mental status, normal strength, sensation, and motion Psych: alert; oriented to person, place and time, normally interactive and not anxious or depressed in appearance.   All labs reviewed with patient.  Lipids: Lab Results  Component Value Date   CHOL 212 (H) 08/25/2020   Lab Results  Component Value Date   HDL 57.30 08/25/2020   Lab Results  Component Value Date   LDLCALC 127 (H) 08/25/2020   Lab Results  Component Value Date   TRIG 137.0 08/25/2020   Lab Results  Component Value Date   CHOLHDL 4 08/25/2020   CBC: CBC Latest Ref Rng & Units 08/25/2020 05/06/2019 05/01/2018  WBC 4.0 - 10.5 K/uL 9.9 7.6 8.1  Hemoglobin 12.0 - 15.0 g/dL 9.7(L) 9.4(L) 9.5(L)  Hematocrit 36 - 46 % 29.5(L) 28.1(L) 27.4(L)  Platelets 150 - 400 K/uL 343.0 343.0 627.0    Basic Metabolic Panel:    Component Value Date/Time   NA 136 08/25/2020 1004   K 4.8 08/25/2020 1004   CL 102 08/25/2020 1004   CO2 27 08/25/2020 1004   BUN 25 (H) 08/25/2020 1004   CREATININE 1.45 (H) 08/25/2020 1004    GLUCOSE 93 08/25/2020 1004   CALCIUM 9.0 08/25/2020 1004   Hepatic Function Latest Ref Rng & Units 08/25/2020 05/06/2019 05/01/2018  Total Protein 6.0 - 8.3 g/dL 6.9 6.8 6.6  Albumin 3.5 - 5.2 g/dL 4.1 4.2 4.1  AST 0 - 37 U/L '13 12 13  ' ALT 0 - 35 U/L '9 7 9  ' Alk Phosphatase 39 - 117 U/L 97 61 65  Total Bilirubin 0.2 - 1.2 mg/dL 0.4 0.4 0.3  Bilirubin, Direct 0.0 - 0.3 mg/dL 0.1 0.1 0.1    Lab Results  Component Value Date   HGBA1C 5.8 05/06/2019   Lab Results  Component Value Date   TSH 3.15 08/25/2020    No results found.  Assessment and Plan:     ICD-10-CM   1. Healthcare maintenance  Z00.00    She does have some iron deficiency anemia, and her colonoscopy is up-to-date.  She does have some neuropathy, and it is noted that she has B12 and D deficiency.  She did note that she has no interest in taking gabapentin.  Patient Instructions  Start taking b-12 over the counter, 1,000 micrograms a day  Vitamin D:  Make sure you are taking 4,000 units a day  Get you Covid-19 vaccine booster    Health Maintenance Exam: The patient's preventative maintenance and recommended screening tests for an annual wellness exam were reviewed in full today. Brought up to date unless services declined.  Counselled on the importance of diet, exercise, and its role in overall health and mortality. The patient's FH and SH was reviewed, including their home life, tobacco status, and drug and alcohol status.  Follow-up in 1 year for physical exam or additional follow-up below.  I have personally reviewed the Medicare Annual Wellness questionnaire and have noted 1. The patient's medical and social history 2. Their use of alcohol, tobacco or illicit drugs 3. Their current medications and supplements 4. The patient's functional ability including ADL's, fall risks, home safety risks and hearing or visual             impairment. 5. Diet and  physical activities 6. Evidence for depression or mood  disorders 7. Reviewed Updated provider list, see scanned forms and [facility] Snapshot.  8. Reviewed whether or not the patient has HCPOA or living will, and discussed what this means with the patient.  Recommended she bring in a copy for his chart in [facility].  The patients weight, height, BMI and visual acuity have been recorded in the chart I have made referrals, counseling and provided education to the patient based review of the above and I have provided the pt with a written personalized care plan for preventive services.  I have provided the patient with a copy of your personalized plan for preventive services. Instructed to take the time to review along with their updated medication list.  Follow-up: No follow-ups on file. Or follow-up in 1 year unless noted.  Future Appointments  Date Time Provider [place]  09/25/2020  3:30 PM [facility] NURSE HEALTH ADVISOR [facility]    No orders of the defined types were placed in this encounter.  There are no discontinued medications. No orders of the defined types were placed in this encounter.   Signed,  Maud Deed. Xochitl Egle, MD   Allergies as of 08/31/2020      Reactions   Prednisone Other (See Comments)   Joint pain,joint stiffness   Statins Rash   "hands broke out"      Medication List       Accurate as of August 31, 2020 11:59 PM. If you have any questions, ask your nurse or doctor.        acetaminophen-codeine 300-30 MG tablet Commonly known as: TYLENOL #3 Take 1 tablet by mouth every 6 (six) hours as needed for moderate pain.   amLODipine 10 MG tablet Commonly known as: NORVASC TAKE 1 TABLET EVERY DAY BEFORE BREAKFAST   aspirin 81 MG tablet Take 81 mg by mouth daily.   Cholecalciferol 50 MCG (2000 UT) Caps Take by mouth.   lisinopril 20 MG tablet Commonly known as: ZESTRIL Take by mouth.   metoprolol 200 MG 24 hr tablet Commonly known as: TOPROL-XL TAKE 1 TABLET EVERY DAY   OVER THE COUNTER  MEDICATION Calcium 900 mg. One 600 mg tablet and 1/2 of a 600 mg tablet one time a day.   sodium bicarbonate 650 MG tablet TAKE 1 TABLET (650 MG TOTAL) BY MOUTH 2 (TWO) TIMES A DAY   traMADol 50 MG tablet Commonly known as: ULTRAM Take 1 tablet (50 mg total) by mouth every 6 (six) hours as needed.   vitamin B-12 1000 MCG tablet Commonly known as: CYANOCOBALAMIN Take 1,000 mcg by mouth daily.

## 2020-09-01 ENCOUNTER — Encounter: Payer: Self-pay | Admitting: Family Medicine

## 2020-09-01 DIAGNOSIS — J309 Allergic rhinitis, unspecified: Secondary | ICD-10-CM | POA: Insufficient documentation

## 2020-09-04 ENCOUNTER — Other Ambulatory Visit: Payer: Self-pay | Admitting: Family Medicine

## 2020-09-22 DIAGNOSIS — D3132 Benign neoplasm of left choroid: Secondary | ICD-10-CM | POA: Diagnosis not present

## 2020-09-24 DIAGNOSIS — K1321 Leukoplakia of oral mucosa, including tongue: Secondary | ICD-10-CM | POA: Diagnosis not present

## 2020-09-25 ENCOUNTER — Other Ambulatory Visit: Payer: Self-pay

## 2020-09-25 ENCOUNTER — Ambulatory Visit: Payer: Medicare HMO

## 2020-12-29 DIAGNOSIS — N1832 Chronic kidney disease, stage 3b: Secondary | ICD-10-CM | POA: Diagnosis not present

## 2021-01-28 DIAGNOSIS — I10 Essential (primary) hypertension: Secondary | ICD-10-CM | POA: Diagnosis not present

## 2021-01-28 DIAGNOSIS — N1832 Chronic kidney disease, stage 3b: Secondary | ICD-10-CM | POA: Diagnosis not present

## 2021-01-28 DIAGNOSIS — N2581 Secondary hyperparathyroidism of renal origin: Secondary | ICD-10-CM | POA: Diagnosis not present

## 2021-01-28 DIAGNOSIS — D631 Anemia in chronic kidney disease: Secondary | ICD-10-CM | POA: Diagnosis not present

## 2021-03-03 ENCOUNTER — Other Ambulatory Visit: Payer: Self-pay

## 2021-03-03 NOTE — Telephone Encounter (Signed)
Last filled 06/26/2020 #50 with 2 refills... last OV 08/2020 CPE please advise

## 2021-03-04 MED ORDER — TRAMADOL HCL 50 MG PO TABS
50.0000 mg | ORAL_TABLET | Freq: Four times a day (QID) | ORAL | 2 refills | Status: DC | PRN
Start: 2021-03-04 — End: 2021-06-25

## 2021-04-28 DIAGNOSIS — Z1231 Encounter for screening mammogram for malignant neoplasm of breast: Secondary | ICD-10-CM | POA: Diagnosis not present

## 2021-04-30 DIAGNOSIS — K1321 Leukoplakia of oral mucosa, including tongue: Secondary | ICD-10-CM | POA: Diagnosis not present

## 2021-05-04 DIAGNOSIS — R928 Other abnormal and inconclusive findings on diagnostic imaging of breast: Secondary | ICD-10-CM | POA: Diagnosis not present

## 2021-05-04 DIAGNOSIS — R922 Inconclusive mammogram: Secondary | ICD-10-CM | POA: Diagnosis not present

## 2021-05-04 LAB — HM MAMMOGRAPHY

## 2021-05-05 ENCOUNTER — Encounter: Payer: Self-pay | Admitting: Family Medicine

## 2021-05-06 ENCOUNTER — Telehealth: Payer: Self-pay

## 2021-05-06 NOTE — Telephone Encounter (Signed)
[facility] - Client TELEPHONE ADVICE RECORD [facility] Patient Name: Taylor Bush Gender: Female DOB: [birth year] Age: 75 Y 9 M 16 D Return Phone Number: [phone] (Primary) [place] [address] Client [facility] - Client Client Site [place] - Day Physician Copland, Frederico Hamman - MD Contact Type Call Who Is Calling Patient / Member / Family / Caregiver Call Type Triage / Clinical Relationship To Patient Self Return Phone Number [phone] (Primary) Chief Complaint Rash - Widespread Reason for Call Symptomatic / Request for [place] states she has a rash all over her body. Has had it for a couple of days, and itching. Bumps on her head and neck, itching. Translation No Nurse Assessment Nurse: Velta Addison, RN, Crystal Date/Time (Eastern Time): 05/06/2021 9:02:25 AM Confirm and document reason for call. If symptomatic, describe symptoms. ---Caller states she has a rash all over her body. Has had it for a couple of days, and itching. Bumps on her head, back, stomach, panty line, waist and neck. Does not know if running fever. Using Benadryl cream but not helping Does the patient have any new or worsening symptoms? ---Yes Will a triage be completed? ---Yes Related visit to physician within the last 2 weeks? ---No Does the PT have any chronic conditions? (i.e. diabetes, asthma, this includes High risk factors for pregnancy, etc.) ---Yes List chronic conditions. ---HTN, Is this a behavioral health or substance abuse call? ---No Guidelines Guideline Title Affirmed Question Affirmed Notes Nurse Date/Time (Eastern Time) Rash or Redness - Widespread Mild widespread rash Parrott, RN, Crystal 05/06/2021 9:04:41 AM Disp. Time Eilene Ghazi Time) Disposition Final User 05/06/2021 9:11:24 AM SEE PCP WITHIN 3 DAYS Yes Parrott, RN,  Crystal PLEASE NOTE: All timestamps contained within this report are represented as [place] Standard Time. CONFIDENTIALTY NOTICE: This fax transmission is intended only for the addressee. It contains information that is legally privileged, confidential or otherwise protected from use or disclosure. If you are not the intended recipient, you are strictly prohibited from reviewing, disclosing, copying using or disseminating any of this information or taking any action in reliance on or regarding this information. If you have received this fax in error, please notify us immediately by telephone so that we can arrange for its return to [place]. Phone: [phone], Toll-Free: [phone], Fax: [phone] Page: 2 of 2 Call Id: [id number] Caller Disagree/Comply Comply Caller Understands Yes PreDisposition Call Doctor Care Advice Given Per Guideline * You need to be seen within 2 or 3 days. SEE PCP WITHIN 3 DAYS: CARE ADVICE given per Rash - Widespread and Cause Unknown (Adult) guideline. * You become worse CALL BACK IF: After Care Instructions Given Call Event Type User Date / Time Description Education document email Velta Addison, RN, [place] 05/06/2021 9:13:23 AM Rash Widespread - Cause Unknown Comments User: Baruch Goldmann Date/Time ([place] Time): 05/06/2021 8:58:12 AM Caller was transferred from the office, states no appointments until Tuesday. User: Hamilton Capri, RN Date/Time Eil[place] Ghazi Time): 05/06/2021 9:12:09 AM Current Temp 97.6 forehead Referrals REFERRED TO PCP OFFICE

## 2021-05-06 NOTE — Telephone Encounter (Signed)
Noted  

## 2021-05-06 NOTE — Telephone Encounter (Signed)
Pt already has appt with Dr Diona Browner on 05/07/21 at 3:20; pt is not taking any new meds, pt does not have new clothes or furniture and pt is not using any new body wash or laundry detergent. No yard work. No difficulty breathing and no swelling in mouth, throat, tongue or lips. Pt will plan on keeping appt with DR Diona Browner on 05/07/21; UC & ED precautions given and pt voiced understanding. Sending FYI to DR [facility].

## 2021-05-07 ENCOUNTER — Ambulatory Visit (INDEPENDENT_AMBULATORY_CARE_PROVIDER_SITE_OTHER): Payer: Medicare Other | Admitting: Family Medicine

## 2021-05-07 ENCOUNTER — Other Ambulatory Visit: Payer: Self-pay

## 2021-05-07 ENCOUNTER — Encounter: Payer: Self-pay | Admitting: Family Medicine

## 2021-05-07 VITALS — BP 140/60 | HR 66 | Temp 98.1°F | Ht 61.25 in | Wt 153.8 lb

## 2021-05-07 DIAGNOSIS — R21 Rash and other nonspecific skin eruption: Secondary | ICD-10-CM | POA: Diagnosis not present

## 2021-05-07 MED ORDER — TRIAMCINOLONE ACETONIDE 0.5 % EX CREA: 1.0000 "application " | TOPICAL_CREAM | Freq: Two times a day (BID) | CUTANEOUS | 0 refills | Status: DC

## 2021-05-07 NOTE — Progress Notes (Signed)
Patient ID: Taylor Bush, female    DOB: 11/27/1945, 75 y.o.   MRN: [id number]  This visit was conducted in person.  BP 140/60   Pulse 66   Temp 98.1 F (36.7 C) (Temporal)   Ht 5' 1.25" (1.556 m)   Wt 153 lb 12 oz (69.7 kg)   SpO2 98%   BMI 28.81 kg/m    CC: Chief Complaint  Patient presents with   Rash    Bilateral Legs/Abdomen & Back-Very Itchy    Subjective:   HPI: Taylor Bush is a 75 y.o. female presenting on 05/07/2021 for Rash (Bilateral Legs/Abdomen & Back-Very Itchy)   Noted rash noted on back legs and abdomen..sudden onset 3 days ago. No additional symptoms.  Occ itchy.  Small red d bumps.. no blister, no pustules  No new contacts/exposures.. no plant or new lotion contacts. No new medications.   No sick contacts.    No lip or tounge swelling.  Has been on ACEI longtime.Marland Kitchen no change in dose.  She has tried benadryl and otc salve.   Allergy to prednsione  Relevant past medical, surgical, family and social history reviewed and updated as indicated. Interim medical history since our last visit reviewed. Allergies and medications reviewed and updated. Outpatient Medications Prior to Visit  Medication Sig Dispense Refill   acetaminophen-codeine (TYLENOL #3) 300-30 MG tablet Take 1 tablet by mouth every 6 (six) hours as needed for moderate pain. 30 tablet 0   amLODipine (NORVASC) 10 MG tablet TAKE 1 TABLET EVERY DAY BEFORE BREAKFAST 90 tablet 3   aspirin 81 MG tablet Take 81 mg by mouth daily.     Cholecalciferol 50 MCG (2000 UT) CAPS Take by mouth.     lisinopril (ZESTRIL) 20 MG tablet Take by mouth.     metoprolol (TOPROL-XL) 200 MG 24 hr tablet TAKE 1 TABLET EVERY DAY 90 tablet 3   OVER THE COUNTER MEDICATION Calcium 900 mg. One 600 mg tablet and 1/2 of a 600 mg tablet one time a day.     sodium bicarbonate 650 MG tablet TAKE 1 TABLET (650 MG TOTAL) BY MOUTH 2 (TWO) TIMES A DAY     traMADol (ULTRAM) 50 MG tablet Take 1 tablet (50 mg total) by mouth  every 6 (six) hours as needed. 50 tablet 2   vitamin B-12 (CYANOCOBALAMIN) 1000 MCG tablet Take 1,000 mcg by mouth daily.     No facility-administered medications prior to visit.     Per HPI unless specifically indicated in ROS section below Review of Systems  Constitutional:  Negative for fatigue and fever.  HENT:  Negative for congestion.   Eyes:  Negative for pain.  Respiratory:  Negative for cough and shortness of breath.   Cardiovascular:  Negative for chest pain, palpitations and leg swelling.  Gastrointestinal:  Negative for abdominal pain.  Genitourinary:  Negative for dysuria and vaginal bleeding.  Musculoskeletal:  Negative for back pain.  Neurological:  Negative for syncope, light-headedness and headaches.  Psychiatric/Behavioral:  Negative for dysphoric mood.   Objective:  BP 140/60   Pulse 66   Temp 98.1 F (36.7 C) (Temporal)   Ht 5' 1.25" (1.556 m)   Wt 153 lb 12 oz (69.7 kg)   SpO2 98%   BMI 28.81 kg/m   Wt Readings from Last 3 Encounters:  05/07/21 153 lb 12 oz (69.7 kg)  08/31/20 151 lb 12 oz (68.8 kg)  06/29/20 148 lb 13 oz (67.5 kg)  Physical Exam Constitutional:      General: She is not in acute distress.    Appearance: Normal appearance. She is well-developed. She is not ill-appearing or toxic-appearing.  HENT:     Head: Normocephalic.     Right Ear: Hearing, tympanic membrane, ear canal and external ear normal. Tympanic membrane is not erythematous, retracted or bulging.     Left Ear: Hearing, tympanic membrane, ear canal and external ear normal. Tympanic membrane is not erythematous, retracted or bulging.     Nose: No mucosal edema or rhinorrhea.     Right Sinus: No maxillary sinus tenderness or frontal sinus tenderness.     Left Sinus: No maxillary sinus tenderness or frontal sinus tenderness.     Mouth/Throat:     Pharynx: Uvula midline.  Eyes:     General: Lids are normal. Lids are everted, no foreign bodies appreciated.      Conjunctiva/sclera: Conjunctivae normal.     Pupils: Pupils are equal, round, and reactive to light.  Neck:     Thyroid: No thyroid mass or thyromegaly.     Vascular: No carotid bruit.     Trachea: Trachea normal.  Cardiovascular:     Rate and Rhythm: Normal rate and regular rhythm.     Pulses: Normal pulses.     Heart sounds: Normal heart sounds, S1 normal and S2 normal. No murmur heard.   No friction rub. No gallop.  Pulmonary:     Effort: Pulmonary effort is normal. No tachypnea or respiratory distress.     Breath sounds: Normal breath sounds. No decreased breath sounds, wheezing, rhonchi or rales.  Abdominal:     General: Bowel sounds are normal.     Palpations: Abdomen is soft.     Tenderness: There is no abdominal tenderness.  Musculoskeletal:     Cervical back: Normal range of motion and neck supple.  Skin:    General: Skin is warm and dry.     Findings: No rash.  Neurological:     Mental Status: She is alert.  Psychiatric:        Mood and Affect: Mood is not anxious or depressed.        Speech: Speech normal.        Behavior: Behavior normal. Behavior is cooperative.        Thought Content: Thought content normal.        Judgment: Judgment normal.      Results for orders placed or performed in visit on 05/05/21  HM MAMMOGRAPHY  Result Value Ref Range   HM Mammogram 0-4 Bi-Rad 0-4 Bi-Rad, Self Reported Normal    This visit occurred during the SARS-CoV-2 public health emergency.  Safety protocols were in place, including screening questions prior to the visit, additional usage of staff PPE, and extensive cleaning of exam room while observing appropriate contact time as indicated for disinfecting solutions.   COVID 19 screen:  No recent travel or known exposure to COVID19 The patient denies respiratory symptoms of COVID 19 at this time. The importance of social distancing was discussed today.   Assessment and Plan    Problem List Items Addressed This Visit      Rash - Primary    Likely allergic dermatitis. Treat with daily zyrtec at bedtime. Apply topical steroid cream to site twice daily.         Eliezer Lofts, MD

## 2021-05-07 NOTE — Patient Instructions (Addendum)
Treat with daily zyrtec at bedtime and s Apply topical steroid cream to site twice daily.   Call if worsening .. go to ER if severe shortness of breath.

## 2021-06-25 ENCOUNTER — Telehealth: Payer: Self-pay | Admitting: Family Medicine

## 2021-06-25 NOTE — Telephone Encounter (Signed)
Last office visit 05/07/2021 with Dr. Diona Browner for rash.  Last refilled 03/04/2021 for #50 with 2 refills.  No future appointments.

## 2021-06-25 NOTE — Telephone Encounter (Signed)
  Encourage patient to contact the pharmacy for refills or they can request refills through [place]:  Please schedule appointment if longer than 1 year  NEXT APPOINTMENT DATE:  MEDICATION: traMADol (ULTRAM) 50 MG tablet  Is the patient out of medication? yes  PH[facility]ACY: [facility] [place]  Let patient know to contact pharmacy at the end of the day to make sure medication is ready.  Please notify patient to allow 48-72 hours to process  CLINICAL FILLS OUT ALL BELOW:   LAST REFILL:  QTY:  REFILL DATE:    OTHER COMMENTS:    Okay for refill?  Please advise

## 2021-06-27 MED ORDER — TRAMADOL HCL 50 MG PO TABS: 50.0000 mg | ORAL_TABLET | Freq: Four times a day (QID) | ORAL | 2 refills | Status: DC | PRN

## 2021-07-14 NOTE — Assessment & Plan Note (Signed)
Likely allergic dermatitis. Treat with daily zyrtec at bedtime. Apply topical steroid cream to site twice daily.

## 2021-07-29 DIAGNOSIS — I10 Essential (primary) hypertension: Secondary | ICD-10-CM | POA: Diagnosis not present

## 2021-07-29 DIAGNOSIS — N1832 Chronic kidney disease, stage 3b: Secondary | ICD-10-CM | POA: Diagnosis not present

## 2021-07-29 DIAGNOSIS — D631 Anemia in chronic kidney disease: Secondary | ICD-10-CM | POA: Diagnosis not present

## 2021-08-03 DIAGNOSIS — E871 Hypo-osmolality and hyponatremia: Secondary | ICD-10-CM | POA: Diagnosis not present

## 2021-08-03 DIAGNOSIS — N2581 Secondary hyperparathyroidism of renal origin: Secondary | ICD-10-CM | POA: Diagnosis not present

## 2021-08-03 DIAGNOSIS — I10 Essential (primary) hypertension: Secondary | ICD-10-CM | POA: Diagnosis not present

## 2021-08-03 DIAGNOSIS — D631 Anemia in chronic kidney disease: Secondary | ICD-10-CM | POA: Diagnosis not present

## 2021-08-03 DIAGNOSIS — N1832 Chronic kidney disease, stage 3b: Secondary | ICD-10-CM | POA: Diagnosis not present

## 2021-09-07 ENCOUNTER — Telehealth: Payer: Self-pay | Admitting: Family Medicine

## 2021-09-07 MED ORDER — METOPROLOL SUCCINATE ER 200 MG PO TB24: 200.0000 mg | ORAL_TABLET | Freq: Every day | ORAL | 0 refills | Status: DC

## 2021-09-07 MED ORDER — AMLODIPINE BESYLATE 10 MG PO TABS: 10.0000 mg | ORAL_TABLET | Freq: Every day | ORAL | 0 refills | Status: DC

## 2021-09-07 MED ORDER — LISINOPRIL 20 MG PO TABS: 20.0000 mg | ORAL_TABLET | Freq: Every day | ORAL | 0 refills | Status: DC

## 2021-09-07 NOTE — Telephone Encounter (Signed)
Please schedule MWV with health educator and CPE with Dr. Copland. 

## 2021-09-07 NOTE — Telephone Encounter (Signed)
Called mrs. Debhora and left vm to call back schedule CPE

## 2021-09-07 NOTE — Telephone Encounter (Signed)
  Encourage patient to contact the pharmacy for refills or they can request refills through [place]:  Please schedule appointment if longer than 1 year  NEXT APPOINTMENT DATE:no future appt  MEDICATION:metoprolol (TOPROL-XL) 200 MG 24 hr tablet,amLODipine (NORVASC) 10 MG tablet, lisinopril (ZESTRIL) 20 MG tablet Is the patient out of medication?   [place] mail delivery [phone] fax [phone]//didn't have address  Let patient know to contact pharmacy at the end of the day to make sure medication is ready.  Please notify patient to allow 48-72 hours to process  CLINICAL FILLS OUT ALL BELOW:   LAST REFILL:  QTY:  REFILL DATE:    OTHER COMMENTS:    Okay for refill?  Please advise

## 2021-09-08 NOTE — Telephone Encounter (Signed)
Pt called and scheduled appt for 11/7

## 2021-09-13 ENCOUNTER — Ambulatory Visit (INDEPENDENT_AMBULATORY_CARE_PROVIDER_SITE_OTHER): Payer: Medicare Other | Admitting: Family Medicine

## 2021-09-13 ENCOUNTER — Other Ambulatory Visit: Payer: Self-pay

## 2021-09-13 ENCOUNTER — Encounter: Payer: Self-pay | Admitting: Family Medicine

## 2021-09-13 VITALS — BP 142/66 | HR 61 | Temp 97.1°F | Ht 60.5 in | Wt 156.0 lb

## 2021-09-13 DIAGNOSIS — R5383 Other fatigue: Secondary | ICD-10-CM | POA: Diagnosis not present

## 2021-09-13 DIAGNOSIS — E559 Vitamin D deficiency, unspecified: Secondary | ICD-10-CM

## 2021-09-13 DIAGNOSIS — E538 Deficiency of other specified B group vitamins: Secondary | ICD-10-CM | POA: Insufficient documentation

## 2021-09-13 DIAGNOSIS — Z0000 Encounter for general adult medical examination without abnormal findings: Secondary | ICD-10-CM | POA: Diagnosis not present

## 2021-09-13 DIAGNOSIS — N183 Chronic kidney disease, stage 3 unspecified: Secondary | ICD-10-CM | POA: Diagnosis not present

## 2021-09-13 DIAGNOSIS — E782 Mixed hyperlipidemia: Secondary | ICD-10-CM | POA: Diagnosis not present

## 2021-09-13 DIAGNOSIS — D508 Other iron deficiency anemias: Secondary | ICD-10-CM | POA: Diagnosis not present

## 2021-09-13 DIAGNOSIS — Z79899 Other long term (current) drug therapy: Secondary | ICD-10-CM | POA: Diagnosis not present

## 2021-09-13 HISTORY — DX: Deficiency of other specified B group vitamins: E53.8

## 2021-09-13 NOTE — Progress Notes (Signed)
Taylor Bump T. Muranda Coye, MD, [name], [address]  Phone: [phone]  FAX: [phone]  Taylor Bush - 75 y.o. female  MRN [id number]  Date of Birth: 1946-07-26  Date: 09/13/2021  PCP: Owens Loffler, MD  Referral: Owens Loffler, MD  Chief Complaint  Patient presents with   Annual Exam    This visit occurred during the SARS-CoV-2 public health emergency.  Safety protocols were in place, including screening questions prior to the visit, additional usage of staff PPE, and extensive cleaning of exam room while observing appropriate contact time as indicated for disinfecting solutions.   Patient Care Team: Owens Loffler, MD as PCP - General Subjective:   Taylor Bush is a 75 y.o. pleasant patient who presents for a General wellness exam.  Health Maintenance Summary Reviewed and updated, unless pt declines services.  Tobacco History Reviewed. Non-smoker Alcohol: No concerns, no excessive use Exercise Habits: 2 or 3 times a week STD concerns: none Drug Use: None Birth control method: n/a Menses regular: n/a Lumps or breast concerns: no Breast Cancer Family History: no  Flu shot last week  Covid bivalent  BP Readings from Last 3 Encounters:  09/13/21 (!) 142/66  05/07/21 140/60  08/31/20 (!) 150/60     Health Maintenance  Topic Date Due   COVID-19 Vaccine (3 - Pfizer risk series) 02/19/2020   INFLUENZA VACCINE  06/07/2021   TETANUS/TDAP  04/28/2027 (Originally 07/21/1965)   COLONOSCOPY (Pts 45-58yr Insurance coverage will need to be confirmed)  07/11/2022   Pneumonia Vaccine 75 Years old  Completed   DEXA SCAN  Completed   Hepatitis C Screening  Completed   Zoster Vaccines- Shingrix  Completed   HPV VACCINES  Aged Out   Immunization History  Administered Date(s) Administered   Influenza, High Dose Seasonal PF 08/23/2016, 08/24/2017, 08/16/2018, 08/11/2020    Influenza-Unspecified 07/08/2014, 08/08/2015, 08/08/2015   PFIZER(Purple Top)SARS-COV-2 Vaccination 01/01/2020, 01/22/2020   Pneumococcal Conjugate-13 03/04/2015   Pneumococcal Polysaccharide-23 09/05/2012   Zoster Recombinat (Shingrix) 08/05/2019, 12/03/2019    Patient Active Problem List   Diagnosis Date Noted   B12 deficiency 09/13/2021   Allergic rhinitis    CKD (chronic kidney disease) stage 3, GFR 30-59 ml/min (HCC) 11/29/2013   [place] head ([place] 12/16/2011   Anemia, iron deficiency 12/16/2011   Hx of total hip arthroplasty 12/15/2011   AVN (avascular necrosis of bone) Right Hip 10/04/2011   ATHEROSCLEROSIS W /INT CLAUDICATION 06/17/2009   Lumbar back pain with radiculopathy affecting left lower extremity 06/03/2009   HYPERLIPIDEMIA 12/15/2008   Essential hypertension 12/15/2008   UNSPECIFIED OPEN-ANGLE GLAUCOMA 10/27/2008    Past Medical History:  Diagnosis Date   Allergic rhinitis    B12 deficiency 09/13/2021   Blood transfusion    11/12    CKD (chronic kidney disease) stage 3, GFR 30-59 ml/min (HCC) 11/29/2013   HLD (hyperlipidemia)    HTN (hypertension)    Hx of total hip arthroplasty 12/15/2011   Iron deficiency anemia    Lumbar back pain    with radiculopathy   Open-angle glaucoma    PAD (peripheral artery disease) ([place]    with intermittent claudication, ABI 0.72 on Rt and 0.73 on L when assessed in 2010.     Past Surgical History:  Procedure Laterality Date   abnormal vascular studies  7/10   ABI's 70, R SFA > 50%^ blockage    cataract surgery  2/10  EXCISION OF TONGUE LESION WITH LASER N/A 08/31/2015   Procedure: BIOPSY AND CO2 LASER OF TONGUE LESION ;  Surgeon: Izora Gala, MD;  Location: [place];  Service: ENT;  Laterality: N/A;   EXCISION OF TONGUE LESION WITH LASER Left 06/29/2020   Procedure: EXCISION OF TONGUE LESION/CO2 laser w/biopsy;  Surgeon: Izora Gala, MD;  Location: [place];   Service: ENT;  Laterality: Left;   OTHER SURGICAL HISTORY     ganglion cyst removed left wrist    TOTAL HIP ARTHROPLASTY  10/03/2011   Procedure: TOTAL HIP ARTHROPLASTY;  Surgeon: Gearlean Alf;  Location: [facility];  Service: Orthopedics;  Laterality: Right;   TOTAL HIP ARTHROPLASTY  12/21/2011   Procedure: TOTAL HIP ARTHROPLASTY;  Surgeon: Gearlean Alf, MD;  Location: [facility];  Service: Orthopedics;  Laterality: Left;   TUBAL LIGATION      Family History  Problem Relation Age of Onset   Heart attack Father        25s   Stroke Neg Hx        also no PAD or aneurysm   Colon cancer Neg Hx    Esophageal cancer Neg Hx    Stomach cancer Neg Hx    Rectal cancer Neg Hx     Past Medical History, Surgical History, Social History, Family History, Problem List, Medications, and Allergies have been reviewed and updated if relevant.  Review of Systems: Pertinent positives are listed above.  Otherwise, a full 14 point review of systems has been done in full and it is negative except where it is noted positive.  Objective:   BP (!) 142/66   Pulse 61   Temp (!) 97.1 F (36.2 C) (Temporal)   Ht 5' 0.5" (1.537 m)   Wt 156 lb (70.8 kg)   SpO2 97%   BMI 29.97 kg/m  Fall Risk 05/01/2018 05/09/2019 06/29/2020 08/31/2020 09/13/2021  Falls in the past year? No 0 - 1 0  Was there an injury with Fall? - - - 1 0  Fall Risk Category Calculator - - - 2 0  Fall Risk Category - - - Moderate Low  Patient Fall Risk Level - - Low fall risk - Low fall risk   Ideal Body Weight: Weight in (lb) to have BMI = 25: 129.9 Hearing Screening   '500Hz'  '1000Hz'  '2000Hz'  '4000Hz'   Right ear '20 20 20 20  ' Left ear '20 20 20 20  ' Vision Screening - Comments:: Patient sees eye doctor yearly states she is setting up appointment because she is due for new glasses.  Depression screen [facility] 2/9 09/13/2021 08/31/2020 05/09/2019 05/01/2018 04/27/2017  Decreased Interest 0 0 0 0 0  Down, Depressed, Hopeless - 0 0 0 0  PHQ - 2 Score 0 0 0 0 0   Altered sleeping - - - 0 -  Tired, decreased energy - - - 0 -  Change in appetite - - - 0 -  Feeling bad or failure about yourself  - - - 0 -  Trouble concentrating - - - 0 -  Moving slowly or fidgety/restless - - - 0 -  Suicidal thoughts - - - 0 -  PHQ-9 Score - - - 0 -  Difficult doing work/chores - - - Not difficult at all -     GEN: well developed, well nourished, no acute distress Eyes: conjunctiva and lids normal, PERRLA, EOMI ENT: TM clear, nares clear, oral exam WNL Neck: supple, no lymphadenopathy, no thyromegaly, no  JVD Pulm: clear to auscultation and percussion, respiratory effort normal CV: regular rate and rhythm, S1-S2, no murmur, rub or gallop, no bruits Chest: no scars, masses, no lumps BREAST: breast exam declined GI: soft, non-tender; no hepatosplenomegaly, masses; active bowel sounds all quadrants GU: GU exam declined Lymph: no cervical, axillary or inguinal adenopathy MSK: gait normal, muscle tone and strength WNL, no joint swelling, effusions, discoloration, crepitus  SKIN: clear, good turgor, color WNL, no rashes, lesions, or ulcerations Neuro: normal mental status, normal strength, sensation, and motion Psych: alert; oriented to person, place and time, normally interactive and not anxious or depressed in appearance.   All labs reviewed with patient.  Lipids: Lab Results  Component Value Date   CHOL 212 (H) 08/25/2020   Lab Results  Component Value Date   HDL 57.30 08/25/2020   Lab Results  Component Value Date   LDLCALC 127 (H) 08/25/2020   Lab Results  Component Value Date   TRIG 137.0 08/25/2020   Lab Results  Component Value Date   CHOLHDL 4 08/25/2020   CBC: CBC Latest Ref Rng & Units 08/25/2020 05/06/2019 05/01/2018  WBC 4.0 - 10.5 K/uL 9.9 7.6 8.1  Hemoglobin 12.0 - 15.0 g/dL 9.7(L) 9.4(L) 9.5(L)  Hematocrit 36.0 - 46.0 % 29.5(L) 28.1(L) 27.4(L)  Platelets 150.0 - 400.0 K/uL 343.0 343.0 836.6    Basic Metabolic Panel:     Component Value Date/Time   NA 136 08/25/2020 1004   K 4.8 08/25/2020 1004   CL 102 08/25/2020 1004   CO2 27 08/25/2020 1004   BUN 25 (H) 08/25/2020 1004   CREATININE 1.45 (H) 08/25/2020 1004   GLUCOSE 93 08/25/2020 1004   CALCIUM 9.0 08/25/2020 1004   Hepatic Function Latest Ref Rng & Units 08/25/2020 05/06/2019 05/01/2018  Total Protein 6.0 - 8.3 g/dL 6.9 6.8 6.6  Albumin 3.5 - 5.2 g/dL 4.1 4.2 4.1  AST 0 - 37 U/L '13 12 13  ' ALT 0 - 35 U/L '9 7 9  ' Alk Phosphatase 39 - 117 U/L 97 61 65  Total Bilirubin 0.2 - 1.2 mg/dL 0.4 0.4 0.3  Bilirubin, Direct 0.0 - 0.3 mg/dL 0.1 0.1 0.1    Lab Results  Component Value Date   HGBA1C 5.8 05/06/2019   Lab Results  Component Value Date   TSH 3.15 08/25/2020    No results found.  Assessment and Plan:     ICD-10-CM   1. Healthcare maintenance  Z00.00     2. Stage 3 chronic kidney disease, unspecified whether stage 3a or 3b CKD (HCC)  N18.30     3. Other iron deficiency anemia  D50.8 CBC with Differential/Platelet    IBC + Ferritin    CANCELED: CBC with Differential/Platelet    CANCELED: IBC + Ferritin    4. B12 deficiency  E53.8 Vitamin B12    CANCELED: Vitamin B12    5. Vitamin D deficiency  E55.9 VITAMIN D 25 Hydroxy (Vit-D Deficiency, Fractures)    CANCELED: VITAMIN D 25 Hydroxy (Vit-D Deficiency, Fractures)    6. Vitamin B12 deficiency  E53.8     7. Encounter for long-term (current) use of medications  Q94.765 Basic metabolic panel    Hepatic function panel    CANCELED: Basic metabolic panel    CANCELED: Hepatic function panel    8. Mixed hyperlipidemia  E78.2 Lipid panel    CANCELED: Lipid panel    9. Other fatigue  R53.83 TSH    CANCELED: TSH     All labs today  Exercise as able - she is doing exercise bike 2-3 times a week right now.  Will review labs when they have returned.   Health Maintenance Exam: The patient's preventative maintenance and recommended screening tests for an annual wellness exam were  reviewed in full today. Brought up to date unless services declined.  Counselled on the importance of diet, exercise, and its role in overall health and mortality. The patient's FH and SH was reviewed, including their home life, tobacco status, and drug and alcohol status.  Follow-up in 1 year for physical exam or additional follow-up below.  The patients weight, height, BMI and visual acuity have been recorded in the chart Follow-up: No follow-ups on file. Or follow-up in 1 year unless noted.  No future appointments.   No orders of the defined types were placed in this encounter.  There are no discontinued medications. Orders Placed This Encounter  Procedures   Basic metabolic panel   CBC with Differential/Platelet   Hepatic function panel   IBC + Ferritin   Lipid panel   TSH   Vitamin B12   VITAMIN D 25 Hydroxy (Vit-D Deficiency, Fractures)    Signed,  Keghan Mcfarren T. Aldean Suddeth, MD   Allergies as of 09/13/2021       Reactions   Prednisone Other (See Comments)   Joint pain,joint stiffness   Statins Rash   "hands broke out"        Medication List        Accurate as of September 13, 2021  2:58 PM. If you have any questions, ask your nurse or doctor.          acetaminophen-codeine 300-30 MG tablet Commonly known as: TYLENOL #3 Take 1 tablet by mouth every 6 (six) hours as needed for moderate pain.   amLODipine 10 MG tablet Commonly known as: NORVASC Take 1 tablet (10 mg total) by mouth daily.   aspirin 81 MG tablet Take 81 mg by mouth daily.   Cholecalciferol 50 MCG (2000 UT) Caps Take by mouth.   lisinopril 20 MG tablet Commonly known as: ZESTRIL Take 1 tablet (20 mg total) by mouth daily.   metoprolol 200 MG 24 hr tablet Commonly known as: TOPROL-XL Take 1 tablet (200 mg total) by mouth daily.   OVER THE COUNTER MEDICATION Calcium 900 mg. One 600 mg tablet and 1/2 of a 600 mg tablet one time a day.   sodium bicarbonate 650 MG tablet TAKE 1  TABLET (650 MG TOTAL) BY MOUTH 2 (TWO) TIMES A DAY   traMADol 50 MG tablet Commonly known as: ULTRAM Take 1 tablet (50 mg total) by mouth every 6 (six) hours as needed.   triamcinolone cream 0.5 % Commonly known as: KENALOG Apply 1 application topically 2 (two) times daily.   vitamin B-12 1000 MCG tablet Commonly known as: CYANOCOBALAMIN Take 1,000 mcg by mouth daily.

## 2021-09-13 NOTE — Patient Instructions (Addendum)
Check your blood pressure twice a week.  You want it to be ideally 130/80 or less.    If it usually in the 140's or higher, we may need to increase your blood pressure medication.

## 2021-09-14 ENCOUNTER — Encounter: Payer: Self-pay | Admitting: Family Medicine

## 2021-09-14 LAB — CBC WITH DIFFERENTIAL/PLATELET
Basophils Absolute: 0.1 10*3/uL (ref 0.0–0.1)
Basophils Relative: 1.1 % (ref 0.0–3.0)
Eosinophils Absolute: 0.4 10*3/uL (ref 0.0–0.7)
Eosinophils Relative: 3.8 % (ref 0.0–5.0)
HCT: 29.2 % — ABNORMAL LOW (ref 36.0–46.0)
Hemoglobin: 9.8 g/dL — ABNORMAL LOW (ref 12.0–15.0)
Lymphocytes Relative: 17.8 % (ref 12.0–46.0)
Lymphs Abs: 1.8 10*3/uL (ref 0.7–4.0)
MCHC: 33.6 g/dL (ref 30.0–36.0)
MCV: 94.9 fl (ref 78.0–100.0)
Monocytes Absolute: 1.1 10*3/uL — ABNORMAL HIGH (ref 0.1–1.0)
Monocytes Relative: 10.6 % (ref 3.0–12.0)
Neutro Abs: 6.9 10*3/uL (ref 1.4–7.7)
Neutrophils Relative %: 66.7 % (ref 43.0–77.0)
Platelets: 317 10*3/uL (ref 150.0–400.0)
RBC: 3.07 Mil/uL — ABNORMAL LOW (ref 3.87–5.11)
RDW: 13.7 % (ref 11.5–15.5)
WBC: 10.3 10*3/uL (ref 4.0–10.5)

## 2021-09-14 LAB — HEPATIC FUNCTION PANEL
ALT: 8 U/L (ref 0–35)
AST: 13 U/L (ref 0–37)
Albumin: 4.3 g/dL (ref 3.5–5.2)
Alkaline Phosphatase: 62 U/L (ref 39–117)
Bilirubin, Direct: 0.1 mg/dL (ref 0.0–0.3)
Total Bilirubin: 0.3 mg/dL (ref 0.2–1.2)
Total Protein: 7 g/dL (ref 6.0–8.3)

## 2021-09-14 LAB — IBC + FERRITIN
Ferritin: 536.3 ng/mL — ABNORMAL HIGH (ref 10.0–291.0)
Iron: 53 ug/dL (ref 42–145)
Saturation Ratios: 17.9 % — ABNORMAL LOW (ref 20.0–50.0)
TIBC: 296.8 ug/dL (ref 250.0–450.0)
Transferrin: 212 mg/dL (ref 212.0–360.0)

## 2021-09-14 LAB — BASIC METABOLIC PANEL
BUN: 32 mg/dL — ABNORMAL HIGH (ref 6–23)
CO2: 24 mEq/L (ref 19–32)
Calcium: 9.3 mg/dL (ref 8.4–10.5)
Chloride: 105 mEq/L (ref 96–112)
Creatinine, Ser: 1.76 mg/dL — ABNORMAL HIGH (ref 0.40–1.20)
GFR: 28.04 mL/min — ABNORMAL LOW (ref >60.00)
Glucose, Bld: 100 mg/dL — ABNORMAL HIGH (ref 70–99)
Potassium: 5.3 mEq/L — ABNORMAL HIGH (ref 3.5–5.1)
Sodium: 137 mEq/L (ref 135–145)

## 2021-09-14 LAB — VITAMIN D 25 HYDROXY (VIT D DEFICIENCY, FRACTURES): VITD: 26.75 ng/mL — ABNORMAL LOW (ref 30.00–100.00)

## 2021-09-14 LAB — LIPID PANEL
Cholesterol: 254 mg/dL — ABNORMAL HIGH (ref 0–200)
HDL: 54.5 mg/dL (ref >39.00)
NonHDL: 199.5
Total CHOL/HDL Ratio: 5
Triglycerides: 225 mg/dL — ABNORMAL HIGH (ref 0.0–149.0)
VLDL: 45 mg/dL — ABNORMAL HIGH (ref 0.0–40.0)

## 2021-09-14 LAB — TSH: TSH: 2.03 u[IU]/mL (ref 0.35–5.50)

## 2021-09-14 LAB — VITAMIN B12: Vitamin B-12: 265 pg/mL (ref 211–911)

## 2021-09-14 LAB — LDL CHOLESTEROL, DIRECT: Direct LDL: 163 mg/dL

## 2021-09-27 ENCOUNTER — Other Ambulatory Visit: Payer: Self-pay | Admitting: *Deleted

## 2021-09-27 MED ORDER — TRAMADOL HCL 50 MG PO TABS
50.0000 mg | ORAL_TABLET | Freq: Four times a day (QID) | ORAL | 2 refills | Status: DC | PRN
Start: 2021-09-27 — End: 2021-12-08

## 2021-09-27 NOTE — Telephone Encounter (Signed)
Last office visit 09/13/2021 for CPE.  Last refilled 06/27/2021 for #50 with 2 refills.  No future appointments.

## 2021-11-14 ENCOUNTER — Other Ambulatory Visit: Payer: Self-pay | Admitting: Family Medicine

## 2021-11-23 DIAGNOSIS — K1321 Leukoplakia of oral mucosa, including tongue: Secondary | ICD-10-CM | POA: Diagnosis not present

## 2021-12-01 DIAGNOSIS — H35052 Retinal neovascularization, unspecified, left eye: Secondary | ICD-10-CM | POA: Diagnosis not present

## 2021-12-03 ENCOUNTER — Encounter (HOSPITAL_BASED_OUTPATIENT_CLINIC_OR_DEPARTMENT_OTHER): Payer: Self-pay | Admitting: Otolaryngology

## 2021-12-07 NOTE — Anesthesia Preprocedure Evaluation (Deleted)
Anesthesia Evaluation    Airway        Dental   Pulmonary former smoker,           Cardiovascular hypertension, Pt. on medications and Pt. on home beta blockers + Peripheral Vascular Disease       Neuro/Psych glaucoma    GI/Hepatic   Endo/Other    Renal/GU Renal InsufficiencyRenal disease     Musculoskeletal   Abdominal   Peds  Hematology   Anesthesia Other Findings   Reproductive/Obstetrics                             Anesthesia Physical Anesthesia Plan  ASA: 3  Anesthesia Plan: General   Post-op Pain Management: Tylenol PO (pre-op) and Minimal or no pain anticipated   Induction: Intravenous  PONV Risk Score and Plan: 3 and Ondansetron, Dexamethasone and Treatment may vary due to age or medical condition  Airway Management Planned: Oral ETT  Additional Equipment: None  Intra-op Plan:   Post-operative Plan: Extubation in OR  Informed Consent:   Plan Discussed with:   Anesthesia Plan Comments:         Anesthesia Quick Evaluation

## 2021-12-08 ENCOUNTER — Ambulatory Visit (HOSPITAL_BASED_OUTPATIENT_CLINIC_OR_DEPARTMENT_OTHER): Payer: Medicare Other | Admitting: Anesthesiology

## 2021-12-08 ENCOUNTER — Other Ambulatory Visit: Payer: Self-pay

## 2021-12-08 ENCOUNTER — Encounter (HOSPITAL_BASED_OUTPATIENT_CLINIC_OR_DEPARTMENT_OTHER): Payer: Self-pay | Admitting: Otolaryngology

## 2021-12-08 ENCOUNTER — Encounter (HOSPITAL_BASED_OUTPATIENT_CLINIC_OR_DEPARTMENT_OTHER)
Admission: RE | Disposition: A | Discharge status: Home / Self Care | Payer: Self-pay | Source: Home / Self Care | Attending: Otolaryngology

## 2021-12-08 ENCOUNTER — Other Ambulatory Visit: Payer: Self-pay | Admitting: Family Medicine

## 2021-12-08 ENCOUNTER — Ambulatory Visit (HOSPITAL_BASED_OUTPATIENT_CLINIC_OR_DEPARTMENT_OTHER)
Admission: RE | Admit: 2021-12-08 | Discharge: 2021-12-08 | Disposition: A | Discharge status: Home / Self Care | Payer: Medicare Other | Attending: Otolaryngology | Admitting: Otolaryngology

## 2021-12-08 DIAGNOSIS — Z6830 Body mass index (BMI) 30.0-30.9, adult: Secondary | ICD-10-CM | POA: Diagnosis not present

## 2021-12-08 DIAGNOSIS — I739 Peripheral vascular disease, unspecified: Secondary | ICD-10-CM | POA: Insufficient documentation

## 2021-12-08 DIAGNOSIS — K148 Other diseases of tongue: Secondary | ICD-10-CM | POA: Diagnosis not present

## 2021-12-08 DIAGNOSIS — K140 Glossitis: Secondary | ICD-10-CM | POA: Diagnosis not present

## 2021-12-08 DIAGNOSIS — I10 Essential (primary) hypertension: Secondary | ICD-10-CM | POA: Insufficient documentation

## 2021-12-08 DIAGNOSIS — Z87891 Personal history of nicotine dependence: Secondary | ICD-10-CM | POA: Insufficient documentation

## 2021-12-08 DIAGNOSIS — Z79899 Other long term (current) drug therapy: Secondary | ICD-10-CM | POA: Diagnosis not present

## 2021-12-08 DIAGNOSIS — E669 Obesity, unspecified: Secondary | ICD-10-CM | POA: Diagnosis not present

## 2021-12-08 DIAGNOSIS — M1990 Unspecified osteoarthritis, unspecified site: Secondary | ICD-10-CM | POA: Diagnosis not present

## 2021-12-08 DIAGNOSIS — K1321 Leukoplakia of oral mucosa, including tongue: Secondary | ICD-10-CM | POA: Diagnosis not present

## 2021-12-08 HISTORY — PX: EXCISION ORAL LESION WITH CO2 LASER: SHX6461

## 2021-12-08 SURGERY — DESTRUCTION, LESION, MOUTH, USING CO2 LASER
Anesthesia: Monitor Anesthesia Care | Site: Mouth

## 2021-12-08 MED ORDER — SILVER NITRATE-POT NITRATE 75-25 % EX MISC
CUTANEOUS | Status: AC
  Filled 2021-12-08: qty 10

## 2021-12-08 MED ORDER — OXYCODONE HCL 5 MG/5ML PO SOLN: 5.0000 mg | Freq: Once | ORAL | Status: DC | PRN

## 2021-12-08 MED ORDER — MEPERIDINE HCL 25 MG/ML IJ SOLN: 6.2500 mg | INTRAMUSCULAR | Status: DC | PRN

## 2021-12-08 MED ORDER — MIDAZOLAM HCL 2 MG/2ML IJ SOLN
INTRAMUSCULAR | Status: AC
  Filled 2021-12-08: qty 2

## 2021-12-08 MED ORDER — TRAMADOL HCL 50 MG PO TABS: 50.0000 mg | ORAL_TABLET | Freq: Four times a day (QID) | ORAL | 2 refills | Status: DC | PRN

## 2021-12-08 MED ORDER — LIDOCAINE-EPINEPHRINE 1 %-1:100000 IJ SOLN
INTRAMUSCULAR | Status: DC | PRN
  Administered 2021-12-08: 4 mL

## 2021-12-08 MED ORDER — LACTATED RINGERS IV SOLN: INTRAVENOUS | Status: DC

## 2021-12-08 MED ORDER — PROMETHAZINE HCL 25 MG/ML IJ SOLN: 6.2500 mg | INTRAMUSCULAR | Status: DC | PRN

## 2021-12-08 MED ORDER — OXYCODONE HCL 5 MG PO TABS: 5.0000 mg | ORAL_TABLET | Freq: Once | ORAL | Status: DC | PRN

## 2021-12-08 MED ORDER — EPINEPHRINE PF 1 MG/ML IJ SOLN
INTRAMUSCULAR | Status: AC
  Filled 2021-12-08: qty 1

## 2021-12-08 MED ORDER — ACETAMINOPHEN 500 MG PO TABS
1000.0000 mg | ORAL_TABLET | Freq: Once | ORAL | Status: AC
  Administered 2021-12-08: 1000 mg via ORAL

## 2021-12-08 MED ORDER — FENTANYL CITRATE (PF) 100 MCG/2ML IJ SOLN: 25.0000 ug | INTRAMUSCULAR | Status: DC | PRN

## 2021-12-08 MED ORDER — LIDOCAINE 2% (20 MG/ML) 5 ML SYRINGE
INTRAMUSCULAR | Status: AC
  Filled 2021-12-08: qty 5

## 2021-12-08 MED ORDER — ONDANSETRON HCL 4 MG/2ML IJ SOLN
INTRAMUSCULAR | Status: DC | PRN
  Administered 2021-12-08: 4 mg via INTRAVENOUS

## 2021-12-08 MED ORDER — LIDOCAINE-EPINEPHRINE 1 %-1:100000 IJ SOLN
INTRAMUSCULAR | Status: AC
  Filled 2021-12-08: qty 1

## 2021-12-08 MED ORDER — ACETAMINOPHEN 500 MG PO TABS
ORAL_TABLET | ORAL | Status: AC
  Filled 2021-12-08: qty 2

## 2021-12-08 MED ORDER — FENTANYL CITRATE (PF) 100 MCG/2ML IJ SOLN
INTRAMUSCULAR | Status: DC | PRN
  Administered 2021-12-08: 50 ug via INTRAVENOUS

## 2021-12-08 MED ORDER — ONDANSETRON HCL 4 MG/2ML IJ SOLN
INTRAMUSCULAR | Status: AC
  Filled 2021-12-08: qty 2

## 2021-12-08 MED ORDER — PROPOFOL 500 MG/50ML IV EMUL
INTRAVENOUS | Status: AC
  Filled 2021-12-08: qty 50

## 2021-12-08 MED ORDER — MIDAZOLAM HCL 2 MG/2ML IJ SOLN: 0.5000 mg | Freq: Once | INTRAMUSCULAR | Status: DC | PRN

## 2021-12-08 MED ORDER — MIDAZOLAM HCL 2 MG/2ML IJ SOLN
INTRAMUSCULAR | Status: DC | PRN
  Administered 2021-12-08 (×2): 1 mg via INTRAVENOUS

## 2021-12-08 MED ORDER — BACITRACIN ZINC 500 UNIT/GM EX OINT
TOPICAL_OINTMENT | CUTANEOUS | Status: AC
  Filled 2021-12-08: qty 0.9

## 2021-12-08 MED ORDER — FENTANYL CITRATE (PF) 100 MCG/2ML IJ SOLN
INTRAMUSCULAR | Status: AC
  Filled 2021-12-08: qty 2

## 2021-12-08 MED ORDER — OXYMETAZOLINE HCL 0.05 % NA SOLN
NASAL | Status: AC
  Filled 2021-12-08: qty 30

## 2021-12-08 SURGICAL SUPPLY — 41 items
BLADE SURG 15 STRL LF DISP TIS (BLADE) IMPLANT
BLADE SURG 15 STRL SS (BLADE) ×2
BNDG EYE OVAL (GAUZE/BANDAGES/DRESSINGS) ×4 IMPLANT
CANISTER SUCT 1200ML W/VALVE (MISCELLANEOUS) ×2 IMPLANT
COAGULATOR SUCT SWTCH 10FR 6 (ELECTROSURGICAL) IMPLANT
COVER MAYO STAND STRL (DRAPES) ×2 IMPLANT
DEPRESSOR TONGUE BLADE STERILE (MISCELLANEOUS) ×2 IMPLANT
ELECT COATED BLADE 2.86 ST (ELECTRODE) IMPLANT
ELECT REM PT RETURN 9FT ADLT (ELECTROSURGICAL) ×2
ELECTRODE REM PT RTRN 9FT ADLT (ELECTROSURGICAL) IMPLANT
GAUZE 4X4 16PLY ~~LOC~~+RFID DBL (SPONGE) ×1 IMPLANT
GAUZE SPONGE 4X4 12PLY STRL LF (GAUZE/BANDAGES/DRESSINGS) IMPLANT
GLOVE SURG POLYISO LF SZ7 (GLOVE) ×1 IMPLANT
GLOVE SURG SYN 7.5  E (GLOVE) ×2
GLOVE SURG SYN 7.5 E (GLOVE) ×1 IMPLANT
GLOVE SURG SYN 7.5 PF PI (GLOVE) ×1 IMPLANT
GLOVE SURG UNDER POLY LF SZ7 (GLOVE) ×1 IMPLANT
GOWN STRL REUS W/ TWL LRG LVL3 (GOWN DISPOSABLE) ×1 IMPLANT
GOWN STRL REUS W/ TWL XL LVL3 (GOWN DISPOSABLE) ×1 IMPLANT
GOWN STRL REUS W/TWL LRG LVL3 (GOWN DISPOSABLE) ×2
GOWN STRL REUS W/TWL XL LVL3 (GOWN DISPOSABLE) ×2
MARKER SKIN DUAL TIP RULER LAB (MISCELLANEOUS) IMPLANT
NDL HYPO 27GX1-1/4 (NEEDLE) ×1 IMPLANT
NDL HYPO 30GX1 BEV (NEEDLE) IMPLANT
NEEDLE HYPO 27GX1-1/4 (NEEDLE) ×2 IMPLANT
NEEDLE HYPO 30GX1 BEV (NEEDLE) IMPLANT
NS IRRIG 1000ML POUR BTL (IV SOLUTION) ×2 IMPLANT
PACK BASIN DAY SURGERY FS (CUSTOM PROCEDURE TRAY) ×1 IMPLANT
PATTIES SURGICAL .5 X3 (DISPOSABLE) IMPLANT
PENCIL FOOT CONTROL (ELECTRODE) IMPLANT
PUNCH BIOPSY DERMAL 4MM (MISCELLANEOUS) IMPLANT
SHEET MEDIUM DRAPE 40X70 STRL (DRAPES) ×2 IMPLANT
SLEEVE SCD COMPRESS KNEE MED (STOCKING) IMPLANT
SUT CHROMIC 4 0 P 3 18 (SUTURE) IMPLANT
SUT SILK 3 0 PS 1 (SUTURE) IMPLANT
SUT VIC AB 3-0 SH 27 (SUTURE)
SUT VIC AB 3-0 SH 27X BRD (SUTURE) IMPLANT
SYR BULB EAR ULCER 3OZ GRN STR (SYRINGE) IMPLANT
SYR CONTROL 10ML LL (SYRINGE) ×1 IMPLANT
TOWEL GREEN STERILE FF (TOWEL DISPOSABLE) ×2 IMPLANT
TUBE CONNECTING 20X1/4 (TUBING) ×2 IMPLANT

## 2021-12-08 NOTE — Anesthesia Preprocedure Evaluation (Addendum)
Anesthesia Evaluation  Patient identified by MRN, date of birth, ID band Patient awake    Reviewed: Allergy & Precautions, NPO status , Patient's Chart, lab work & pertinent test results, reviewed documented beta blocker date and time   History of Anesthesia Complications Negative for: history of anesthetic complications  Airway Mallampati: II  TM Distance: >3 FB Neck ROM: Full    Dental  (+) Dental Advisory Given   Pulmonary former smoker,    breath sounds clear to auscultation       Cardiovascular hypertension, Pt. on medications and Pt. on home beta blockers (-) angina+ Peripheral Vascular Disease   Rhythm:Regular Rate:Normal     Neuro/Psych negative neurological ROS     GI/Hepatic negative GI ROS, Neg liver ROS,   Endo/Other  obese  Renal/GU Renal InsufficiencyRenal disease     Musculoskeletal  (+) Arthritis , Osteoarthritis,    Abdominal (+) + obese,   Peds  Hematology negative hematology ROS (+)   Anesthesia Other Findings   Reproductive/Obstetrics                            Anesthesia Physical Anesthesia Plan  ASA: 3  Anesthesia Plan: MAC   Post-op Pain Management: Tylenol PO (pre-op) and Minimal or no pain anticipated   Induction:   PONV Risk Score and Plan: 2 and Ondansetron and Treatment may vary due to age or medical condition  Airway Management Planned: Natural Airway and Nasal Cannula  Additional Equipment: None  Intra-op Plan:   Post-operative Plan:   Informed Consent: I have reviewed the patients History and Physical, chart, labs and discussed the procedure including the risks, benefits and alternatives for the proposed anesthesia with the patient or authorized representative who has indicated his/her understanding and acceptance.     Dental advisory given  Plan Discussed with: CRNA and Surgeon  Anesthesia Plan Comments:        Anesthesia Quick  Evaluation

## 2021-12-08 NOTE — Discharge Instructions (Addendum)
Rinse mouth with salt water several times daily.  Resume normal diet as tolerated.  Okay to use Tylenol/Motrin for pain.  Okay to use Orabase gel for topical pain relief as well. Post Anesthesia Home Care Instructions  Activity: Get plenty of rest for the remainder of the day. A responsible individual must stay with you for 24 hours following the procedure.  For the next 24 hours, DO NOT: -Drive a car -Paediatric nurse -Drink alcoholic beverages -Take any medication unless instructed by your physician -Make any legal decisions or sign important papers.  Meals: Start with liquid foods such as gelatin or soup. Progress to regular foods as tolerated. Avoid greasy, spicy, heavy foods. If nausea and/or vomiting occur, drink only clear liquids until the nausea and/or vomiting subsides. Call your physician if vomiting continues.  Special Instructions/Symptoms: Your throat may feel dry or sore from the anesthesia or the breathing tube placed in your throat during surgery. If this causes discomfort, gargle with warm salt water. The discomfort should disappear within 24 hours.  If you had a scopolamine patch placed behind your ear for the management of post- operative nausea and/or vomiting:  1. The medication in the patch is effective for 72 hours, after which it should be removed.  Wrap patch in a tissue and discard in the trash. Wash hands thoroughly with soap and water. 2. You may remove the patch earlier than 72 hours if you experience unpleasant side effects which may include dry mouth, dizziness or visual disturbances. 3. Avoid touching the patch. Wash your hands with soap and water after contact with the patch.       Next dose ofTylenol may be given at 3:30p

## 2021-12-08 NOTE — H&P (Signed)
Taylor Bush is an 76 y.o. female.   Chief Complaint: Tongue lesion HPI: History of tongue cancer, new tongue lesion.  Past Medical History:  Diagnosis Date   Allergic rhinitis    Anemia in chronic kidney disease (CKD)    B12 deficiency 09/13/2021   Blood transfusion    11/12    CKD (chronic kidney disease) stage 3, GFR 30-59 ml/min ([place]) 11/29/2013   HLD (hyperlipidemia)    HTN (hypertension)    Hx of total hip arthroplasty 12/15/2011   Lumbar back pain    with radiculopathy   Open-angle glaucoma    PAD (peripheral artery disease) ([place])    with intermittent claudication, ABI 0.72 on Rt and 0.73 on L when assessed in 2010.     Past Surgical History:  Procedure Laterality Date   abnormal vascular studies  7/10   ABI's 70, R SFA > 50%^ blockage    cataract surgery  2/10   EXCISION OF TONGUE LESION WITH LASER N/A 08/31/2015   Procedure: BIOPSY AND CO2 LASER OF TONGUE LESION ;  Surgeon: Izora Gala, MD;  Location: [place];  Service: ENT;  Laterality: N/A;   EXCISION OF TONGUE LESION WITH LASER Left 06/29/2020   Procedure: EXCISION OF TONGUE LESION/CO2 laser w/biopsy;  Surgeon: Izora Gala, MD;  Location: [place];  Service: ENT;  Laterality: Left;   OTHER SURGICAL HISTORY     ganglion cyst removed left wrist    TOTAL HIP ARTHROPLASTY  10/03/2011   Procedure: TOTAL HIP ARTHROPLASTY;  Surgeon: Gearlean Alf;  [facility];  Service: Orthopedics;  Laterality: Right;   TOTAL HIP ARTHROPLASTY  12/21/2011   Procedure: TOTAL HIP ARTHROPLASTY;  Surgeon: Gearlean Alf, MD;  [facility];  Service: Orthopedics;  Laterality: Left;   TUBAL LIGATION      Family History  Problem Relation Age of Onset   Heart attack Father        81s   Stroke Neg Hx        also no PAD or aneurysm   Colon cancer Neg Hx    Esophageal cancer Neg Hx    Stomach cancer Neg Hx    Rectal cancer Neg Hx    Social History:  reports that she quit smoking about 13  years ago. Her smoking use included cigarettes. She has never used smokeless tobacco. She reports that she does not drink alcohol and does not use drugs.  Allergies:  Allergies  Allergen Reactions   Prednisone Other (See Comments)    Joint pain,joint stiffness   Statins Rash    "hands broke out"    Medications Prior to Admission  Medication Sig Dispense Refill   amLODipine (NORVASC) 10 MG tablet TAKE 1 TABLET BY MOUTH DAILY 90 tablet 3   aspirin 81 MG tablet Take 81 mg by mouth daily.     Cholecalciferol 50 MCG (2000 UT) CAPS Take by mouth.     lisinopril (ZESTRIL) 20 MG tablet TAKE 1 TABLET BY MOUTH DAILY 90 tablet 3   metoprolol (TOPROL-XL) 200 MG 24 hr tablet TAKE 1 TABLET BY MOUTH DAILY 90 tablet 3   OVER THE COUNTER MEDICATION Calcium 900 mg. One 600 mg tablet and 1/2 of a 600 mg tablet one time a day.     traMADol (ULTRAM) 50 MG tablet Take 1 tablet (50 mg total) by mouth every 6 (six) hours as needed. 50 tablet 2   vitamin B-12 (CYANOCOBALAMIN) 1000 MCG tablet Take 1,000  mcg by mouth daily.     triamcinolone cream (KENALOG) 0.5 % Apply 1 application topically 2 (two) times daily. 60 g 0    No results found for this or any previous visit (from the past 48 hour(s)). No results found.  ROS: otherwise negative  Blood pressure (!) 170/73, pulse (!) 55, temperature 97.6 F (36.4 C), temperature source Oral, resp. rate 16, height 5' (1.524 m), weight 70.1 kg, SpO2 100 %.  PHYSICAL EXAM: Overall appearance:  Healthy appearing, in no distress Head:  Normocephalic, atraumatic. Ears: External auditory canals are clear; tympanic membranes are intact and the middle ears are free of any effusion. Nose: External nose is healthy in appearance. Internal nasal exam free of any lesions or obstruction. Oral Cavity/pharynx: Small area of leukoplakia along the lateral tongue.  No other mucosal lesions or masses identified. Hypopharynx/Larynx: no signs of any mucosal lesions or masses  identified. Vocal cords move normally. Neuro:  No identifiable neurologic deficits. Neck: No palpable neck masses.  Studies Reviewed: none    Assessment/Plan Plan lesion, likely leukoplakia.  Recommend biopsy and laser ablation.  Izora Gala 12/08/2021, 8:55 AM

## 2021-12-08 NOTE — Op Note (Signed)
OPERATIVE REPORT  DATE OF SURGERY: 12/08/2021  PATIENT:  Taylor Bush,  76 y.o. female  PRE-OPERATIVE DIAGNOSIS:  Oral leukoplakia  POST-OPERATIVE DIAGNOSIS: Same  PROCEDURE:  Procedure(s): EXCISION OF ORAL ORAL CAVITY LESION WITH CO2 LASER  SURGEON:  Beckie Salts, MD  ASSISTANTS: None  ANESTHESIA:   General   EBL: Less than 5 ml  DRAINS: None  LOCAL MEDICATIONS USED: 1% Xylocaine with epinephrine  SPECIMEN: Left posterior tongue lesion  COUNTS:  Correct  PROCEDURE DETAILS: The patient was taken to the operating room and placed on the operating table in the supine position. Following induction of intravenous sedation, local anesthesia was infiltrated into the left posterior tongue.  A Tru-Cut sinus type biopsy forcep was used to take a biopsy of the lesion.  The lesion measured approximately 1-1/2 cm in diameter and was slightly raised and white in appearance.  There is minimal bleeding.  The specimen was sent for pathologic evaluation.  The CO2 laser was then used with a handpiece at a setting of 3 W continuous power to ablate the entire lesion.  Suction and a moist Ray-Tec was used to clear off the chart.  There is no bleeding.  She tolerated this well.    PATIENT DISPOSITION:  To PACU, stable

## 2021-12-08 NOTE — Anesthesia Postprocedure Evaluation (Signed)
Anesthesia Post Note  Patient: Taylor Bush  Procedure(s) Performed: EXCISION OF ORAL ORAL CAVITY LESION WITH CO2 LASER (Mouth)     Patient location during evaluation: Phase II Anesthesia Type: MAC Level of consciousness: awake and alert, patient cooperative and oriented Pain management: pain level controlled Vital Signs Assessment: post-procedure vital signs reviewed and stable Respiratory status: spontaneous breathing, nonlabored ventilation and respiratory function stable Cardiovascular status: stable and blood pressure returned to baseline Postop Assessment: no apparent nausea or vomiting, able to ambulate and adequate PO intake Anesthetic complications: no   No notable events documented.  Last Vitals:  Vitals:   12/08/21 1015 12/08/21 1030  BP: 113/64 113/64  Pulse: 79 74  Resp: (!) 22 20  Temp: 36.6 C   SpO2: 99% 100%    Last Pain:  Vitals:   12/08/21 1015  TempSrc:   PainSc: 0-No pain                 Gwynevere Lizana,E. Ivorie Uplinger

## 2021-12-08 NOTE — Transfer of Care (Signed)
Immediate Anesthesia Transfer of Care Note  Patient: ABBRIELLE BATTS  Procedure(s) Performed: EXCISION OF ORAL ORAL CAVITY LESION WITH CO2 LASER (Mouth)  Patient Location: PACU  Anesthesia Type:MAC  Level of Consciousness: awake, alert  and oriented  Airway & Oxygen Therapy: Patient Spontanous Breathing and Patient connected to nasal cannula oxygen  Post-op Assessment: Report given to RN and Post -op Vital signs reviewed and stable  Post vital signs: Reviewed and stable  Last Vitals:  BP 135/59 (83) Vitals Value Taken Time  BP    Temp    Pulse 75 12/08/21 1014  Resp 19 12/08/21 1014  SpO2 99 % 12/08/21 1014  Vitals shown include unvalidated device data.  Last Pain:  Vitals:   12/08/21 0829  TempSrc: Oral  PainSc: 0-No pain         Complications: No notable events documented.

## 2021-12-08 NOTE — Telephone Encounter (Signed)
°  Encourage patient to contact the pharmacy for refills or they can request refills through [place]:  Please schedule appointment if longer than 1 year  NEXT APPOINTMENT DATE:  MEDICATION:traMADol (ULTRAM) 50 MG tablet  Is the patient out of medication?   [facility]ACY:[id number] [place], [place] - [address]  Let patient know to contact pharmacy at the end of the day to make sure medication is ready.  Please notify patient to allow 48-72 hours to process  CLINICAL FILLS OUT ALL BELOW:   LAST REFILL:  QTY:  REFILL DATE:    OTHER COMMENTS:    Okay for refill?  Please advise

## 2021-12-08 NOTE — Telephone Encounter (Signed)
Last office visit 09/13/21 for CPE.  Last refilled 09/27/2021 for #50 with 2 refills.  No future appointments.

## 2021-12-09 ENCOUNTER — Encounter (HOSPITAL_BASED_OUTPATIENT_CLINIC_OR_DEPARTMENT_OTHER): Payer: Self-pay | Admitting: Otolaryngology

## 2021-12-10 DIAGNOSIS — H353231 Exudative age-related macular degeneration, bilateral, with active choroidal neovascularization: Secondary | ICD-10-CM | POA: Diagnosis not present

## 2021-12-10 DIAGNOSIS — H43813 Vitreous degeneration, bilateral: Secondary | ICD-10-CM | POA: Diagnosis not present

## 2021-12-15 LAB — SURGICAL PATHOLOGY

## 2021-12-20 DIAGNOSIS — H353231 Exudative age-related macular degeneration, bilateral, with active choroidal neovascularization: Secondary | ICD-10-CM | POA: Diagnosis not present

## 2021-12-20 NOTE — Telephone Encounter (Signed)
Please close

## 2022-01-17 DIAGNOSIS — H353231 Exudative age-related macular degeneration, bilateral, with active choroidal neovascularization: Secondary | ICD-10-CM | POA: Diagnosis not present

## 2022-01-17 DIAGNOSIS — H43822 Vitreomacular adhesion, left eye: Secondary | ICD-10-CM | POA: Diagnosis not present

## 2022-01-17 DIAGNOSIS — H43813 Vitreous degeneration, bilateral: Secondary | ICD-10-CM | POA: Diagnosis not present

## 2022-01-31 DIAGNOSIS — N2581 Secondary hyperparathyroidism of renal origin: Secondary | ICD-10-CM | POA: Diagnosis not present

## 2022-01-31 DIAGNOSIS — N1832 Chronic kidney disease, stage 3b: Secondary | ICD-10-CM | POA: Diagnosis not present

## 2022-01-31 DIAGNOSIS — E871 Hypo-osmolality and hyponatremia: Secondary | ICD-10-CM | POA: Diagnosis not present

## 2022-01-31 DIAGNOSIS — I10 Essential (primary) hypertension: Secondary | ICD-10-CM | POA: Diagnosis not present

## 2022-01-31 DIAGNOSIS — D539 Nutritional anemia, unspecified: Secondary | ICD-10-CM | POA: Diagnosis not present

## 2022-02-07 DIAGNOSIS — R809 Proteinuria, unspecified: Secondary | ICD-10-CM | POA: Diagnosis not present

## 2022-02-07 DIAGNOSIS — I10 Essential (primary) hypertension: Secondary | ICD-10-CM | POA: Diagnosis not present

## 2022-02-07 DIAGNOSIS — N1832 Chronic kidney disease, stage 3b: Secondary | ICD-10-CM | POA: Diagnosis not present

## 2022-02-07 DIAGNOSIS — D631 Anemia in chronic kidney disease: Secondary | ICD-10-CM | POA: Diagnosis not present

## 2022-02-07 DIAGNOSIS — N2581 Secondary hyperparathyroidism of renal origin: Secondary | ICD-10-CM | POA: Diagnosis not present

## 2022-02-07 DIAGNOSIS — E871 Hypo-osmolality and hyponatremia: Secondary | ICD-10-CM | POA: Diagnosis not present

## 2022-02-22 DIAGNOSIS — H43822 Vitreomacular adhesion, left eye: Secondary | ICD-10-CM | POA: Diagnosis not present

## 2022-02-22 DIAGNOSIS — H353231 Exudative age-related macular degeneration, bilateral, with active choroidal neovascularization: Secondary | ICD-10-CM | POA: Diagnosis not present

## 2022-02-23 ENCOUNTER — Ambulatory Visit (INDEPENDENT_AMBULATORY_CARE_PROVIDER_SITE_OTHER): Payer: Medicare Other

## 2022-02-23 VITALS — Ht 60.0 in | Wt 156.0 lb

## 2022-02-23 DIAGNOSIS — Z0000 Encounter for general adult medical examination without abnormal findings: Secondary | ICD-10-CM

## 2022-02-23 DIAGNOSIS — Z780 Asymptomatic menopausal state: Secondary | ICD-10-CM

## 2022-02-23 NOTE — Patient Instructions (Addendum)
Ms. Clonch , ?Thank you for taking time to complete your Medicare Wellness Visit. I appreciate your ongoing commitment to your health goals. Please review the following plan we discussed and let me know if I can assist you in the future.  ? ?Screening recommendations/referrals: ?Colonoscopy: up to date , next due 07/11/22, you plan to schedule an appointment ?Mammogram: due 05/04/22, per our conversation you have an appointment schedule ?Bone Density: due , scheduled today , someone will call to schedule an appointment ?ophthalmology/optometry: Up to date  ?Recommended yearly dental visit for hygiene and checkup ? ?Vaccinations: ?Influenza vaccine: up to date  ?Pneumococcal vaccine: up to date ?Tdap vaccine: due, medicare will cover in the event you are cut or injured  ?Shingles vaccine: up to date   ?Covid-19:up to date  ? ?Advanced directives: Please bring a copy of Living Will and/or [place] for your chart, when available  ? ? ?Conditions/risks identified: see problem list  ? ?Next appointment: Follow up in one year for your annual wellness visit  ? ? ?Preventive Care 61 Years and Older, Female ?Preventive care refers to lifestyle choices and visits with your health care provider that can promote health and wellness. ?What does preventive care include? ?A yearly physical exam. This is also called an annual well check. ?Dental exams once or twice a year. ?Routine eye exams. Ask your health care provider how often you should have your eyes checked. ?Personal lifestyle choices, including: ?Daily care of your teeth and gums. ?Regular physical activity. ?Eating a healthy diet. ?Avoiding tobacco and drug use. ?Limiting alcohol use. ?Practicing safe sex. ?Taking low-dose aspirin every day. ?Taking vitamin and mineral supplements as recommended by your health care provider. ?What happens during an annual well check? ?The services and screenings done by your health care provider during your annual well  check will depend on your age, overall health, lifestyle risk factors, and family history of disease. ?Counseling  ?Your health care provider may ask you questions about your: ?Alcohol use. ?Tobacco use. ?Drug use. ?Emotional well-being. ?Home and relationship well-being. ?Sexual activity. ?Eating habits. ?History of falls. ?Memory and ability to understand (cognition). ?Work and work Statistician. ?Reproductive health. ?Screening  ?You may have the following tests or measurements: ?Height, weight, and BMI. ?Blood pressure. ?Lipid and cholesterol levels. These may be checked every 5 years, or more frequently if you are over 32 years old. ?Skin check. ?Lung cancer screening. You may have this screening every year starting at age 9 if you have a 30-pack-year history of smoking and currently smoke or have quit within the past 15 years. ?Fecal occult blood test (FOBT) of the stool. You may have this test every year starting at age 13. ?Flexible sigmoidoscopy or colonoscopy. You may have a sigmoidoscopy every 5 years or a colonoscopy every 10 years starting at age 66. ?Hepatitis C blood test. ?Hepatitis B blood test. ?Sexually transmitted disease (STD) testing. ?Diabetes screening. This is done by checking your blood sugar (glucose) after you have not eaten for a while (fasting). You may have this done every 1-3 years. ?Bone density scan. This is done to screen for osteoporosis. You may have this done starting at age 70. ?Mammogram. This may be done every 1-2 years. Talk to your health care provider about how often you should have regular mammograms. ?Talk with your health care provider about your test results, treatment options, and if necessary, the need for more tests. ?Vaccines  ?Your health care provider may recommend certain vaccines,  such as: ?Influenza vaccine. This is recommended every year. ?Tetanus, diphtheria, and acellular pertussis (Tdap, Td) vaccine. You may need a Td booster every 10 years. ?Zoster  vaccine. You may need this after age 76. ?Pneumococcal 13-valent conjugate (PCV13) vaccine. One dose is recommended after age 21. ?Pneumococcal polysaccharide (PPSV23) vaccine. One dose is recommended after age 66. ?Talk to your health care provider about which screenings and vaccines you need and how often you need them. ?This information is not intended to replace advice given to you by your health care provider. Make sure you discuss any questions you have with your health care provider. ?Document Released: 11/20/2015 Document Revised: 07/13/2016 Document Reviewed: 08/25/2015 ?Elsevier Interactive Patient Education ? 2017 [place]. ? ?Fall Prevention in the Home ?Falls can cause injuries. They can happen to people of all ages. There are many things you can do to make your home safe and to help prevent falls. ?What can I do on the outside of my home? ?Regularly fix the edges of walkways and driveways and fix any cracks. ?Remove anything that might make you trip as you walk through a door, such as a raised step or threshold. ?Trim any bushes or trees on the path to your home. ?Use bright outdoor lighting. ?Clear any walking paths of anything that might make someone trip, such as rocks or tools. ?Regularly check to see if handrails are loose or broken. Make sure that both sides of any steps have handrails. ?Any raised decks and porches should have guardrails on the edges. ?Have any leaves, snow, or ice cleared regularly. ?Use sand or salt on walking paths during winter. ?Clean up any spills in your garage right away. This includes oil or grease spills. ?What can I do in the bathroom? ?Use night lights. ?Install grab bars by the toilet and in the tub and shower. Do not use towel bars as grab bars. ?Use non-skid mats or decals in the tub or shower. ?If you need to sit down in the shower, use a plastic, non-slip stool. ?Keep the floor dry. Clean up any water that spills on the floor as soon as it happens. ?Remove  soap buildup in the tub or shower regularly. ?Attach bath mats securely with double-sided non-slip rug tape. ?Do not have throw rugs and other things on the floor that can make you trip. ?What can I do in the bedroom? ?Use night lights. ?Make sure that you have a light by your bed that is easy to reach. ?Do not use any sheets or blankets that are too big for your bed. They should not hang down onto the floor. ?Have a firm chair that has side arms. You can use this for support while you get dressed. ?Do not have throw rugs and other things on the floor that can make you trip. ?What can I do in the kitchen? ?Clean up any spills right away. ?Avoid walking on wet floors. ?Keep items that you use a lot in easy-to-reach places. ?If you need to reach something above you, use a strong step stool that has a grab bar. ?Keep electrical cords out of the way. ?Do not use floor polish or wax that makes floors slippery. If you must use wax, use non-skid floor wax. ?Do not have throw rugs and other things on the floor that can make you trip. ?What can I do with my stairs? ?Do not leave any items on the stairs. ?Make sure that there are handrails on both sides of the stairs and  use them. Fix handrails that are broken or loose. Make sure that handrails are as long as the stairways. ?Check any carpeting to make sure that it is firmly attached to the stairs. Fix any carpet that is loose or worn. ?Avoid having throw rugs at the top or bottom of the stairs. If you do have throw rugs, attach them to the floor with carpet tape. ?Make sure that you have a light switch at the top of the stairs and the bottom of the stairs. If you do not have them, ask someone to add them for you. ?What else can I do to help prevent falls? ?Wear shoes that: ?Do not have high heels. ?Have rubber bottoms. ?Are comfortable and fit you well. ?Are closed at the toe. Do not wear sandals. ?If you use a stepladder: ?Make sure that it is fully opened. Do not climb a  closed stepladder. ?Make sure that both sides of the stepladder are locked into place. ?Ask someone to hold it for you, if possible. ?Clearly mark and make sure that you can see: ?Any grab bars or handrails. ?

## 2022-02-23 NOTE — Progress Notes (Signed)
? ?Subjective:  ? Taylor Bush is a 76 y.o. female who presents for Medicare Annual (Subsequent) preventive examination. ? ?I connected with Taylor Bush today by telephone and verified that I am speaking with the correct person using two identifiers. ?Location patient: home ?Location provider: work ?Persons participating in the virtual visit: patient, nurse.  ?  ?I discussed the limitations, risks, security and privacy concerns of performing an evaluation and management service by telephone and the availability of in person appointments. I also discussed with the patient that there may be a patient responsible charge related to this service. The patient expressed understanding and verbally consented to this telephonic visit.  ?  ?Interactive audio and video telecommunications were attempted between this provider and patient, however failed, due to patient having technical difficulties OR patient did not have access to video capability.  We continued and completed visit with audio only. ? ?Some vital signs may be absent or patient reported.  ? ?Time Spent with patient on telephone encounter: 20 minutes ? ?Review of Systems    ? ?Cardiac Risk Factors include: advanced age (>68mn, >>46women);hypertension;dyslipidemia ? ?   ?Objective:  ?  ?Today's Vitals  ? 02/23/22 1007  ?Weight: 156 lb (70.8 kg)  ?Height: 5' (1.524 m)  ? ?Body mass index is 30.47 kg/m?. ? ? ?  02/23/2022  ? 10:09 AM 12/08/2021  ?  9:09 AM 06/29/2020  ?  8:33 AM 06/23/2020  ?  2:41 PM 05/01/2018  ? 11:56 AM 07/11/2017  ? 10:17 AM 06/28/2017  ?  3:43 PM  ?Advanced Directives  ?Does Patient Have a Medical Advance Directive? No No No No No No No  ?Would patient like information on creating a medical advance directive? Yes (MAU/Ambulatory/Procedural Areas - Information given) No - Patient declined No - Patient declined No - Patient declined No - Patient declined    ? ? ?Current Medications (verified) ?Outpatient Encounter Medications as of 02/23/2022   ?Medication Sig  ? amLODipine (NORVASC) 10 MG tablet TAKE 1 TABLET BY MOUTH DAILY  ? aspirin 81 MG tablet Take 81 mg by mouth daily.  ? Cholecalciferol 50 MCG (2000 UT) CAPS Take by mouth.  ? lisinopril (ZESTRIL) 20 MG tablet TAKE 1 TABLET BY MOUTH DAILY  ? metoprolol (TOPROL-XL) 200 MG 24 hr tablet TAKE 1 TABLET BY MOUTH DAILY  ? OVER THE COUNTER MEDICATION Calcium 900 mg. One 600 mg tablet and 1/2 of a 600 mg tablet one time a day.  ? traMADol (ULTRAM) 50 MG tablet Take 1 tablet (50 mg total) by mouth every 6 (six) hours as needed.  ? triamcinolone cream (KENALOG) 0.5 % Apply 1 application topically 2 (two) times daily.  ? vitamin B-12 (CYANOCOBALAMIN) 1000 MCG tablet Take 1,000 mcg by mouth daily.  ? ?No facility-administered encounter medications on file as of 02/23/2022.  ? ? ?Allergies (verified) ?Prednisone and Statins  ? ?History: ?Past Medical History:  ?Diagnosis Date  ? Allergic rhinitis   ? Anemia in chronic kidney disease (CKD)   ? B12 deficiency 09/13/2021  ? Blood transfusion   ? 11/12   ? CKD (chronic kidney disease) stage 3, GFR 30-59 ml/min (HCC) 11/29/2013  ? HLD (hyperlipidemia)   ? HTN (hypertension)   ? Hx of total hip arthroplasty 12/15/2011  ? Lumbar back pain   ? with radiculopathy  ? Open-angle glaucoma   ? PAD (peripheral artery disease) ([facility]   ? with intermittent claudication, ABI 0.72 on Rt and 0.73 on L when assessed  in 2010.   ? ?Past Surgical History:  ?Procedure Laterality Date  ? abnormal vascular studies  7/10  ? ABI's 70, R SFA > 50%^ blockage   ? cataract surgery  2/10  ? EXCISION OF TONGUE LESION WITH LASER N/A 08/31/2015  ? Procedure: BIOPSY AND CO2 LASER OF TONGUE LESION ;  Surgeon: Izora Gala, MD;  Location: [place];  Service: ENT;  Laterality: N/A;  ? EXCISION OF TONGUE LESION WITH LASER Left 06/29/2020  ? Procedure: EXCISION OF TONGUE LESION/CO2 laser w/biopsy;  Surgeon: Izora Gala, MD;  Location: [place];  Service: ENT;  Laterality:  Left;  ? EXCISION ORAL LESION WITH CO2 LASER N/A 12/08/2021  ? Procedure: EXCISION OF ORAL ORAL CAVITY LESION WITH CO2 LASER;  Surgeon: Izora Gala, MD;  Location: [place];  Service: ENT;  Laterality: N/A;  ? OTHER SURGICAL HISTORY    ? ganglion cyst removed left wrist   ? TOTAL HIP ARTHROPLASTY  10/03/2011  ? Procedure: TOTAL HIP ARTHROPLASTY;  Surgeon: Gearlean Alf;  Location: [facility];  Service: Orthopedics;  Laterality: Right;  ? TOTAL HIP ARTHROPLASTY  12/21/2011  ? Procedure: TOTAL HIP ARTHROPLASTY;  Surgeon: Gearlean Alf, MD;  Location: [facility];  Service: Orthopedics;  Laterality: Left;  ? TUBAL LIGATION    ? ?Family History  ?Problem Relation Age of Onset  ? Heart attack Father   ?     74s  ? Stroke Neg Hx   ?     also no PAD or aneurysm  ? Colon cancer Neg Hx   ? Esophageal cancer Neg Hx   ? Stomach cancer Neg Hx   ? Rectal cancer Neg Hx   ? ?Social History  ? ?Socioeconomic History  ? Marital status: Widowed  ?  Spouse name: Not on file  ? Number of children: Not on file  ? Years of education: Not on file  ? Highest education level: Not on file  ?Occupational History  ? Not on file  ?Tobacco Use  ? Smoking status: Former  ?  Types: Cigarettes  ?  Quit date: 11/07/2008  ?  Years since quitting: 13.3  ? Smokeless tobacco: Never  ?Substance and Sexual Activity  ? Alcohol use: No  ? Drug use: No  ? Sexual activity: Not on file  ?Other Topics Concern  ? Not on file  ?Social History Narrative  ? 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   ? ?Social Determinants of Health  ? ?Financial Resource Strain: Low Risk   ? Difficulty of Paying Living Expenses: Not hard at all  ?Food Insecurity: No Food Insecurity  ? Worried About Charity fundraiser in the Last Year: Never true  ? Ran Out of Food in the Last Year: Never true  ?Transportation Needs: No Transportation Needs  ? Lack of Transportation (Medical): No  ? Lack of Transportation (Non-Medical): No  ?Physical Activity: Insufficiently  Active  ? Days of Exercise per Week: 2 days  ? Minutes of Exercise per Session: 30 min  ?Stress: No Stress Concern Present  ? Feeling of Stress : Not at all  ?Social Connections: Moderately Isolated  ? Frequency of Communication with Friends and Family: More than three times a week  ? Frequency of Social Gatherings with Friends and Family: More than three times a week  ? Attends Religious Services: More than 4 times per year  ? Active Member of Clubs or Organizations: No  ? Attends  Club or Organization Meetings: Never  ? Marital Status: Widowed  ? ? ?Tobacco Counseling ?Counseling given: Not Answered ? ? ?Clinical Intake: ? ?Pre-visit preparation completed: Yes ? ?  ? ?  ? ?BMI - recorded: 30.47 ?Nutritional Status: BMI > 30  Obese ?Nutritional Risks: None ?Diabetes: No ? ?How often do you need to have someone help you when you read instructions, pamphlets, or other written materials from your doctor or pharmacy?: 1 - Never ? ?Diabetic? No ? ?Interpreter Needed?: No ? ?Information entered by :: Orrin Brigham LPN ? ? ?Activities of Daily Living ? ?  02/23/2022  ? 10:12 AM 12/08/2021  ?  9:13 AM  ?In your present state of health, do you have any difficulty performing the following activities:  ?Hearing? 0 0  ?Vision? 0 0  ?Difficulty concentrating or making decisions? 0 0  ?Walking or climbing stairs? 0 0  ?Dressing or bathing? 0 0  ?Doing errands, shopping? 0   ?Preparing Food and eating ? N   ?Using the Toilet? N   ?In the past six months, have you accidently leaked urine? N   ?Do you have problems with loss of bowel control? N   ?Managing your Medications? N   ?Managing your Finances? N   ?Housekeeping or managing your Housekeeping? N   ? ? ?Patient Care Team: ?Owens Loffler, MD as PCP - General ? ?Indicate any recent Medical Services you may have received from other than [facility] providers in the past year (date may be approximate). ? ?   ?Assessment:  ? This is a routine wellness examination for  Cecily. ? ?Hearing/Vision screen ?Hearing Screening - Comments:: No issues  ?Vision Screening - Comments:: Last exam 2 month ago, [facility] ? ?Dietary issues and exercise activities discussed: ?Current Exercise Habits: Home exercise ro

## 2022-03-15 ENCOUNTER — Other Ambulatory Visit: Payer: Self-pay | Admitting: Family Medicine

## 2022-03-15 MED ORDER — TRAMADOL HCL 50 MG PO TABS: 50.0000 mg | ORAL_TABLET | Freq: Four times a day (QID) | ORAL | 2 refills | Status: DC | PRN

## 2022-03-15 NOTE — Telephone Encounter (Signed)
Encourage patient to contact the pharmacy for refills or they can request refills through [facility] ? ?Did the patient contact the pharmacy:  No ? ?LAST APPOINTMENT DATE:  09/13/2021 ? ?MEDICATION:  traMADol (ULTRAM) 50 MG tablet ? ?Is the patient out of medication? Almost ? ?If not, how much is left? 2-3 left ? ?Is this a 90 day supply: Yes ? ?PHARMACY:  [id number] [place] [place] - [address]?Phone Number: [phone]? ?Let patient know to contact pharmacy at the end of the day to make sure medication is ready. ? ?Please notify patient to allow 48-72 hours to process ?  ?

## 2022-03-15 NOTE — Telephone Encounter (Signed)
Last office visit 09/13/21 for CPE.  Last refilled 12/08/21 for #50 with 2 refills.  No future appointments.  ?

## 2022-03-29 DIAGNOSIS — H353231 Exudative age-related macular degeneration, bilateral, with active choroidal neovascularization: Secondary | ICD-10-CM | POA: Diagnosis not present

## 2022-05-02 DIAGNOSIS — H43823 Vitreomacular adhesion, bilateral: Secondary | ICD-10-CM | POA: Diagnosis not present

## 2022-05-02 DIAGNOSIS — H43813 Vitreous degeneration, bilateral: Secondary | ICD-10-CM | POA: Diagnosis not present

## 2022-05-02 DIAGNOSIS — H353231 Exudative age-related macular degeneration, bilateral, with active choroidal neovascularization: Secondary | ICD-10-CM | POA: Diagnosis not present

## 2022-05-31 DIAGNOSIS — H353231 Exudative age-related macular degeneration, bilateral, with active choroidal neovascularization: Secondary | ICD-10-CM | POA: Diagnosis not present

## 2022-06-16 DIAGNOSIS — Z780 Asymptomatic menopausal state: Secondary | ICD-10-CM | POA: Diagnosis not present

## 2022-06-16 DIAGNOSIS — Z1231 Encounter for screening mammogram for malignant neoplasm of breast: Secondary | ICD-10-CM | POA: Diagnosis not present

## 2022-06-16 DIAGNOSIS — M8589 Other specified disorders of bone density and structure, multiple sites: Secondary | ICD-10-CM | POA: Diagnosis not present

## 2022-06-16 LAB — HM DEXA SCAN

## 2022-06-28 ENCOUNTER — Encounter: Payer: Self-pay | Admitting: Family Medicine

## 2022-07-05 DIAGNOSIS — H43823 Vitreomacular adhesion, bilateral: Secondary | ICD-10-CM | POA: Diagnosis not present

## 2022-07-05 DIAGNOSIS — H353231 Exudative age-related macular degeneration, bilateral, with active choroidal neovascularization: Secondary | ICD-10-CM | POA: Diagnosis not present

## 2022-07-27 ENCOUNTER — Encounter: Payer: Self-pay | Admitting: Gastroenterology

## 2022-08-02 DIAGNOSIS — H353231 Exudative age-related macular degeneration, bilateral, with active choroidal neovascularization: Secondary | ICD-10-CM | POA: Diagnosis not present

## 2022-08-02 DIAGNOSIS — H43823 Vitreomacular adhesion, bilateral: Secondary | ICD-10-CM | POA: Diagnosis not present

## 2022-08-02 DIAGNOSIS — H43813 Vitreous degeneration, bilateral: Secondary | ICD-10-CM | POA: Diagnosis not present

## 2022-08-05 ENCOUNTER — Other Ambulatory Visit: Payer: Self-pay | Admitting: Family Medicine

## 2022-08-08 NOTE — Telephone Encounter (Signed)
Please schedule

## 2022-08-08 NOTE — Telephone Encounter (Signed)
Please schedule Medicare Wellness with nurse and CPE with fasting labs prior with Dr. Lorelei Pont for sometime after 09/13/22.

## 2022-08-08 NOTE — Telephone Encounter (Signed)
Patient is scheduled for CPE and labs prior. It's showing she had her Medicare wellness visit back in April of this year.

## 2022-08-15 ENCOUNTER — Ambulatory Visit (AMBULATORY_SURGERY_CENTER): Payer: Medicare Other

## 2022-08-15 VITALS — Ht 60.5 in | Wt 150.0 lb

## 2022-08-15 DIAGNOSIS — Z86010 Personal history of colonic polyps: Secondary | ICD-10-CM

## 2022-08-15 MED ORDER — NA SULFATE-K SULFATE-MG SULF 17.5-3.13-1.6 GM/177ML PO SOLN: 1.0000 | Freq: Once | ORAL | 0 refills | Status: AC

## 2022-08-15 NOTE — Progress Notes (Signed)
No egg or soy allergy known to patient  No issues known to pt with past sedation with any surgeries or procedures Patient denies ever being told they had issues or difficulty with intubation  No FH of Malignant Hyperthermia Pt is not on diet pills Pt is not on home 02  Pt is not on blood thinners  Pt denies issues with constipation  No A fib or A flutter Have any cardiac testing pending--NO Pt instructed to use Singlecare.com or GoodRx for a price reduction on prep  Insurance verified during Pre Visit=[employer] Medicare

## 2022-08-24 ENCOUNTER — Other Ambulatory Visit: Payer: Self-pay | Admitting: Family Medicine

## 2022-08-24 DIAGNOSIS — R7309 Other abnormal glucose: Secondary | ICD-10-CM

## 2022-08-24 DIAGNOSIS — E559 Vitamin D deficiency, unspecified: Secondary | ICD-10-CM

## 2022-08-24 DIAGNOSIS — E538 Deficiency of other specified B group vitamins: Secondary | ICD-10-CM

## 2022-08-24 DIAGNOSIS — Z79899 Other long term (current) drug therapy: Secondary | ICD-10-CM

## 2022-08-24 DIAGNOSIS — E782 Mixed hyperlipidemia: Secondary | ICD-10-CM

## 2022-08-24 DIAGNOSIS — D508 Other iron deficiency anemias: Secondary | ICD-10-CM

## 2022-08-29 DIAGNOSIS — I10 Essential (primary) hypertension: Secondary | ICD-10-CM | POA: Diagnosis not present

## 2022-08-29 DIAGNOSIS — E871 Hypo-osmolality and hyponatremia: Secondary | ICD-10-CM | POA: Diagnosis not present

## 2022-08-29 DIAGNOSIS — D631 Anemia in chronic kidney disease: Secondary | ICD-10-CM | POA: Diagnosis not present

## 2022-08-29 DIAGNOSIS — N1832 Chronic kidney disease, stage 3b: Secondary | ICD-10-CM | POA: Diagnosis not present

## 2022-08-29 DIAGNOSIS — N2581 Secondary hyperparathyroidism of renal origin: Secondary | ICD-10-CM | POA: Diagnosis not present

## 2022-08-29 DIAGNOSIS — R809 Proteinuria, unspecified: Secondary | ICD-10-CM | POA: Diagnosis not present

## 2022-08-30 ENCOUNTER — Encounter: Payer: Self-pay | Admitting: Gastroenterology

## 2022-08-30 DIAGNOSIS — H353231 Exudative age-related macular degeneration, bilateral, with active choroidal neovascularization: Secondary | ICD-10-CM | POA: Diagnosis not present

## 2022-08-30 DIAGNOSIS — H43813 Vitreous degeneration, bilateral: Secondary | ICD-10-CM | POA: Diagnosis not present

## 2022-09-05 DIAGNOSIS — N2581 Secondary hyperparathyroidism of renal origin: Secondary | ICD-10-CM | POA: Diagnosis not present

## 2022-09-05 DIAGNOSIS — E871 Hypo-osmolality and hyponatremia: Secondary | ICD-10-CM | POA: Diagnosis not present

## 2022-09-05 DIAGNOSIS — D631 Anemia in chronic kidney disease: Secondary | ICD-10-CM | POA: Diagnosis not present

## 2022-09-05 DIAGNOSIS — N184 Chronic kidney disease, stage 4 (severe): Secondary | ICD-10-CM | POA: Diagnosis not present

## 2022-09-05 DIAGNOSIS — I129 Hypertensive chronic kidney disease with stage 1 through stage 4 chronic kidney disease, or unspecified chronic kidney disease: Secondary | ICD-10-CM | POA: Diagnosis not present

## 2022-09-07 ENCOUNTER — Other Ambulatory Visit (INDEPENDENT_AMBULATORY_CARE_PROVIDER_SITE_OTHER): Payer: Medicare Other

## 2022-09-07 DIAGNOSIS — R7309 Other abnormal glucose: Secondary | ICD-10-CM

## 2022-09-07 DIAGNOSIS — E538 Deficiency of other specified B group vitamins: Secondary | ICD-10-CM

## 2022-09-07 DIAGNOSIS — Z79899 Other long term (current) drug therapy: Secondary | ICD-10-CM

## 2022-09-07 DIAGNOSIS — D508 Other iron deficiency anemias: Secondary | ICD-10-CM | POA: Diagnosis not present

## 2022-09-07 DIAGNOSIS — E782 Mixed hyperlipidemia: Secondary | ICD-10-CM | POA: Diagnosis not present

## 2022-09-07 DIAGNOSIS — E559 Vitamin D deficiency, unspecified: Secondary | ICD-10-CM | POA: Diagnosis not present

## 2022-09-07 LAB — LIPID PANEL
Cholesterol: 217 mg/dL — ABNORMAL HIGH (ref 0–200)
HDL: 56.7 mg/dL (ref >39.00)
LDL Cholesterol: 129 mg/dL — ABNORMAL HIGH (ref 0–99)
NonHDL: 160.46
Total CHOL/HDL Ratio: 4
Triglycerides: 155 mg/dL — ABNORMAL HIGH (ref 0.0–149.0)
VLDL: 31 mg/dL (ref 0.0–40.0)

## 2022-09-07 LAB — CBC WITH DIFFERENTIAL/PLATELET
Basophils Absolute: 0.1 10*3/uL (ref 0.0–0.1)
Basophils Relative: 1.3 % (ref 0.0–3.0)
Eosinophils Absolute: 0.5 10*3/uL (ref 0.0–0.7)
Eosinophils Relative: 4.7 % (ref 0.0–5.0)
HCT: 31 % — ABNORMAL LOW (ref 36.0–46.0)
Hemoglobin: 10.2 g/dL — ABNORMAL LOW (ref 12.0–15.0)
Lymphocytes Relative: 20.6 % (ref 12.0–46.0)
Lymphs Abs: 2 10*3/uL (ref 0.7–4.0)
MCHC: 32.9 g/dL (ref 30.0–36.0)
MCV: 96.1 fl (ref 78.0–100.0)
Monocytes Absolute: 1 10*3/uL (ref 0.1–1.0)
Monocytes Relative: 10 % (ref 3.0–12.0)
Neutro Abs: 6.2 10*3/uL (ref 1.4–7.7)
Neutrophils Relative %: 63.4 % (ref 43.0–77.0)
Platelets: 317 10*3/uL (ref 150.0–400.0)
RBC: 3.23 Mil/uL — ABNORMAL LOW (ref 3.87–5.11)
RDW: 14.5 % (ref 11.5–15.5)
WBC: 9.7 10*3/uL (ref 4.0–10.5)

## 2022-09-07 LAB — HEPATIC FUNCTION PANEL
ALT: 10 U/L (ref 0–35)
AST: 13 U/L (ref 0–37)
Albumin: 4.1 g/dL (ref 3.5–5.2)
Alkaline Phosphatase: 54 U/L (ref 39–117)
Bilirubin, Direct: 0.1 mg/dL (ref 0.0–0.3)
Total Bilirubin: 0.4 mg/dL (ref 0.2–1.2)
Total Protein: 7 g/dL (ref 6.0–8.3)

## 2022-09-07 LAB — HEMOGLOBIN A1C: Hgb A1c MFr Bld: 5.9 % (ref 4.6–6.5)

## 2022-09-07 LAB — BASIC METABOLIC PANEL
BUN: 26 mg/dL — ABNORMAL HIGH (ref 6–23)
CO2: 28 mEq/L (ref 19–32)
Calcium: 9.6 mg/dL (ref 8.4–10.5)
Chloride: 103 mEq/L (ref 96–112)
Creatinine, Ser: 1.56 mg/dL — ABNORMAL HIGH (ref 0.40–1.20)
GFR: 32.18 mL/min — ABNORMAL LOW (ref >60.00)
Glucose, Bld: 82 mg/dL (ref 70–99)
Potassium: 4.6 mEq/L (ref 3.5–5.1)
Sodium: 138 mEq/L (ref 135–145)

## 2022-09-07 LAB — IBC + FERRITIN
Ferritin: 387.8 ng/mL — ABNORMAL HIGH (ref 10.0–291.0)
Iron: 80 ug/dL (ref 42–145)
Saturation Ratios: 27.2 % (ref 20.0–50.0)
TIBC: 294 ug/dL (ref 250.0–450.0)
Transferrin: 210 mg/dL — ABNORMAL LOW (ref 212.0–360.0)

## 2022-09-07 LAB — VITAMIN B12: Vitamin B-12: 221 pg/mL (ref 211–911)

## 2022-09-07 LAB — TSH: TSH: 2.29 u[IU]/mL (ref 0.35–5.50)

## 2022-09-07 LAB — VITAMIN D 25 HYDROXY (VIT D DEFICIENCY, FRACTURES): VITD: 37.2 ng/mL (ref 30.00–100.00)

## 2022-09-08 ENCOUNTER — Encounter: Payer: Self-pay | Admitting: Gastroenterology

## 2022-09-08 ENCOUNTER — Ambulatory Visit (AMBULATORY_SURGERY_CENTER): Payer: Medicare Other | Admitting: Gastroenterology

## 2022-09-08 VITALS — BP 144/65 | HR 57 | Temp 97.5°F | Resp 18 | Ht 65.5 in | Wt 150.0 lb

## 2022-09-08 DIAGNOSIS — Z09 Encounter for follow-up examination after completed treatment for conditions other than malignant neoplasm: Secondary | ICD-10-CM | POA: Diagnosis not present

## 2022-09-08 DIAGNOSIS — K635 Polyp of colon: Secondary | ICD-10-CM | POA: Diagnosis not present

## 2022-09-08 DIAGNOSIS — Z86010 Personal history of colonic polyps: Secondary | ICD-10-CM | POA: Diagnosis not present

## 2022-09-08 DIAGNOSIS — D123 Benign neoplasm of transverse colon: Secondary | ICD-10-CM | POA: Diagnosis not present

## 2022-09-08 DIAGNOSIS — N184 Chronic kidney disease, stage 4 (severe): Secondary | ICD-10-CM | POA: Diagnosis not present

## 2022-09-08 MED ORDER — SODIUM CHLORIDE 0.9 % IV SOLN: 500.0000 mL | Freq: Once | INTRAVENOUS | Status: DC

## 2022-09-08 NOTE — Patient Instructions (Addendum)
Handouts on polyps and diverticulosis given to you today  Await pathology results   YOU HAD AN ENDOSCOPIC PROCEDURE TODAY AT [facility]:   Refer to the procedure report that was given to you for any specific questions about what was found during the examination.  If the procedure report does not answer your questions, please call your gastroenterologist to clarify.  If you requested that your care partner not be given the details of your procedure findings, then the procedure report has been included in a sealed envelope for you to review at your convenience later.  YOU SHOULD EXPECT: Some feelings of bloating in the abdomen. Passage of more gas than usual.  Walking can help get rid of the air that was put into your GI tract during the procedure and reduce the bloating. If you had a lower endoscopy (such as a colonoscopy or flexible sigmoidoscopy) you may notice spotting of blood in your stool or on the toilet paper. If you underwent a bowel prep for your procedure, you may not have a normal bowel movement for a few days.  Please Note:  You might notice some irritation and congestion in your nose or some drainage.  This is from the oxygen used during your procedure.  There is no need for concern and it should clear up in a day or so.  SYMPTOMS TO REPORT IMMEDIATELY:  Following lower endoscopy (colonoscopy or flexible sigmoidoscopy):  Excessive amounts of blood in the stool  Significant tenderness or worsening of abdominal pains  Swelling of the abdomen that is new, acute  Fever of 100F or higher   For urgent or emergent issues, a gastroenterologist can be reached at any hour by calling [phone]. Do not use MyChart messaging for urgent concerns.    DIET:  We do recommend a small meal at first, but then you may proceed to your regular diet.  Drink plenty of fluids but you should avoid alcoholic beverages for 24 hours.  ACTIVITY:  You should plan to take it easy for the  rest of today and you should NOT DRIVE or use heavy machinery until tomorrow (because of the sedation medicines used during the test).    FOLLOW UP: Our staff will call the number listed on your records the next business day following your procedure.  We will call around 7:15- 8:00 am to check on you and address any questions or concerns that you may have regarding the information given to you following your procedure. If we do not reach you, we will leave a message.     If any biopsies were taken you will be contacted by phone or by letter within the next 1-3 weeks.  Please call us at [phone] if you have not heard about the biopsies in 3 weeks.    SIGNATURES/CONFIDENTIALITY: You and/or your care partner have signed paperwork which will be entered into your electronic medical record.  These signatures attest to the fact that that the information above on your After Visit Summary has been reviewed and is understood.  Full responsibility of the confidentiality of this discharge information lies with you and/or your care-partner.

## 2022-09-08 NOTE — Progress Notes (Signed)
History and Physical:  This patient presents for endoscopic testing for: Encounter Diagnosis  Name Primary?   Personal history of colonic polyps Yes    <70m TA  Sept 2018,   TA in 2013 Patient denies chronic abdominal pain, rectal bleeding, constipation or diarrhea.   Patient is otherwise without complaints or active issues today.   Past Medical History: Past Medical History:  Diagnosis Date   Allergic rhinitis    Anemia in chronic kidney disease (CKD)    Arthritis    bilateral hips   B12 deficiency 09/13/2021   Blood transfusion 2012   and 2013   Cataract    bilateral sx   CKD (chronic kidney disease) stage 3, GFR 30-59 ml/min ([facility] 11/29/2013   HLD (hyperlipidemia)    on meds   HTN (hypertension)    on meds   Hx of total hip arthroplasty 12/15/2011   Lumbar back pain    with radiculopathy   Open-angle glaucoma    PAD (peripheral artery disease) ([facility]    with intermittent claudication, ABI 0.72 on Rt and 0.73 on L when assessed in 2010.    Seasonal allergies      Past Surgical History: Past Surgical History:  Procedure Laterality Date   abnormal vascular studies  05/07/2009   ABI's 70, R SFA > 50%^ blockage    CATARACT EXTRACTION, BILATERAL  2011   EXCISION OF TONGUE LESION WITH LASER N/A 08/31/2015   Procedure: BIOPSY AND CO2 LASER OF TONGUE LESION ;  Surgeon: JIzora Gala MD;  [facility]  Service: ENT;  Laterality: N/A;   EXCISION OF TONGUE LESION WITH LASER Left 06/29/2020   Procedure: EXCISION OF TONGUE LESION/CO2 laser w/biopsy;  Surgeon: RIzora Gala MD;  [facility]  Service: ENT;  Laterality: Left;   EXCISION ORAL LESION WITH CO2 LASER N/A 12/08/2021   Procedure: EXCISION OF ORAL ORAL CAVITY LESION WITH CO2 LASER;  Surgeon: RIzora Gala MD;  [place] [facility]  Service: ENT;  Laterality: N/A;   OTHER SURGICAL HISTORY     ganglion cyst removed left wrist    TOTAL HIP ARTHROPLASTY   10/03/2011   Procedure: TOTAL HIP ARTHROPLASTY;  Surgeon: FGearlean Alf  [facility];  Service: Orthopedics;  Laterality: Right;   TOTAL HIP ARTHROPLASTY  12/21/2011   Procedure: TOTAL HIP ARTHROPLASTY;  Surgeon: FGearlean Alf MD;  [facility];  Service: Orthopedics;  Laterality: Left;   TUBAL LIGATION      Allergies: Allergies  Allergen Reactions   Prednisone Other (See Comments)    Joint pain,joint stiffness   Statins Rash    "hands broke out"    Outpatient Meds: Current Outpatient Medications  Medication Sig Dispense Refill   amLODipine (NORVASC) 10 MG tablet TAKE 1 TABLET BY MOUTH DAILY 100 tablet 0   aspirin 81 MG tablet Take 81 mg by mouth daily.     Cholecalciferol 50 MCG (2000 UT) CAPS Take 1 capsule by mouth daily at 2 am.     lisinopril (ZESTRIL) 20 MG tablet TAKE 1 TABLET BY MOUTH DAILY 100 tablet 0   metoprolol (TOPROL-XL) 200 MG 24 hr tablet TAKE 1 TABLET BY MOUTH DAILY 100 tablet 0   Multiple Vitamins-Minerals (PRESERVISION AREDS PO) Take 1 capsule by mouth daily at 6 (six) AM.     OVER THE COUNTER MEDICATION Take 900 mg by mouth daily at 6 (six) AM. Calcium 900 mg. One 600 mg tablet and 1/2 of a  600 mg tablet one time a day.     traMADol (ULTRAM) 50 MG tablet Take 1 tablet (50 mg total) by mouth every 6 (six) hours as needed. 50 tablet 2   Current Facility-Administered Medications  Medication Dose Route Frequency Provider Last Rate Last Admin   0.9 %  sodium chloride infusion  500 mL Intravenous Once Danis, Kirke Corin, MD          ___________________________________________________________________ Objective   Exam:  BP (!) 129/56 (BP Location: Right Arm, Patient Position: Sitting, Cuff Size: Normal)   Pulse (!) 56   Temp (!) 97.5 F (36.4 C) (Temporal)   Ht 5' 5.5" (1.664 m)   Wt 150 lb (68 kg)   SpO2 97%   BMI 24.58 kg/m   CV: regular , S1/S2 Resp: clear to auscultation bilaterally, normal RR and effort noted GI: soft, no  tenderness, with active bowel sounds.   Assessment: Encounter Diagnosis  Name Primary?   Personal history of colonic polyps Yes     Plan: Colonoscopy  The benefits and risks of the planned procedure were described in detail with the patient or (when appropriate) their health care proxy.  Risks were outlined as including, but not limited to, bleeding, infection, perforation, adverse medication reaction leading to cardiac or pulmonary decompensation, pancreatitis (if ERCP).  The limitation of incomplete mucosal visualization was also discussed.  No guarantees or warranties were given.    The patient is appropriate for an endoscopic procedure in the ambulatory setting.   - Wilfrid Lund, MD

## 2022-09-08 NOTE — Progress Notes (Signed)
Called to room to assist during endoscopic procedure.  Patient ID and intended procedure confirmed with present staff. Received instructions for my participation in the procedure from the performing physician.  

## 2022-09-08 NOTE — Op Note (Signed)
[facility] Patient Name: Taylor Bush Procedure Date: 09/08/2022 10:43 AM MRN: [id number] Endoscopist: Mallie Mussel L. Loletha Carrow , MD, [id number] Age: 76 Referring MD:  Date of Birth: [birth year] Gender: Female Account #: [id number] Procedure:                Colonoscopy Indications:              High risk colon cancer surveillance: Personal                            history of adenoma less than 10 mm in size                           <42m right colon TA Sept 2018, TA in 2013 Medicines:                Monitored Anesthesia Care Procedure:                Pre-Anesthesia Assessment:                           - Prior to the procedure, a History and Physical                            was performed, and patient medications and                            allergies were reviewed. The patient's tolerance of                            previous anesthesia was also reviewed. The risks                            and benefits of the procedure and the sedation                            options and risks were discussed with the patient.                            All questions were answered, and informed consent                            was obtained. Prior Anticoagulants: The patient has                            taken no anticoagulant or antiplatelet agents. ASA                            Grade Assessment: III - A patient with severe                            systemic disease. After reviewing the risks and                            benefits, the patient was deemed in satisfactory  condition to undergo the procedure.                           After obtaining informed consent, the colonoscope                            was passed under direct vision. Throughout the                            procedure, the patient's blood pressure, pulse, and                            oxygen saturations were monitored continuously. The                            PCF-HQ190L Colonoscope  was introduced through the                            anus and advanced to the the cecum, identified by                            appendiceal orifice and ileocecal valve. The                            colonoscopy was performed without difficulty. The                            patient tolerated the procedure well. The quality                            of the bowel preparation was good. The ileocecal                            valve, appendiceal orifice, and rectum were                            photographed. Scope In: 10:47:15 AM Scope Out: 11:00:50 AM Scope Withdrawal Time: 0 hours 9 minutes 37 seconds  Total Procedure Duration: 0 hours 13 minutes 35 seconds  Findings:                 The perianal and digital rectal examinations were                            normal.                           Repeat examination of right colon under NBI                            performed.                           A 10 mm polyp was found in the transverse colon.  The polyp was semi-sessile. The polyp was removed                            with a cold snare. Resection and retrieval were                            complete.                           Multiple diverticula were found in the left colon.                           The exam was otherwise without abnormality on                            direct and retroflexion views. Complications:            No immediate complications. Estimated Blood Loss:     Estimated blood loss was minimal. Impression:               - One 10 mm polyp in the transverse colon, removed                            with a cold snare. Resected and retrieved.                           - Diverticulosis in the left colon.                           - The examination was otherwise normal on direct                            and retroflexion views. Recommendation:           - Patient has a contact number available for                             emergencies. The signs and symptoms of potential                            delayed complications were discussed with the                            patient. Return to normal activities tomorrow.                            Written discharge instructions were provided to the                            patient.                           - Resume previous diet.                           - Continue present medications.                           -  Await pathology results.                           - Repeat colonoscopy may be recommended for                            surveillance. That will be determined after                            pathology results from today's exam become                            available for review. (if so, assess patient's                            overall health and willingness to have a                            colonoscopy at that time) Mallie Mussel L. Loletha Carrow, MD 09/08/2022 11:07:44 AM This report has been signed electronically.

## 2022-09-08 NOTE — Progress Notes (Signed)
Report to PACU, RN, vss, BBS= Clear.  

## 2022-09-09 ENCOUNTER — Telehealth: Payer: Self-pay

## 2022-09-09 NOTE — Telephone Encounter (Signed)
Attempted f/u call. No answer, left VM. 

## 2022-09-13 ENCOUNTER — Encounter: Payer: Self-pay | Admitting: Gastroenterology

## 2022-09-14 ENCOUNTER — Ambulatory Visit (INDEPENDENT_AMBULATORY_CARE_PROVIDER_SITE_OTHER): Payer: Medicare Other | Admitting: Family Medicine

## 2022-09-14 ENCOUNTER — Encounter: Payer: Self-pay | Admitting: Family Medicine

## 2022-09-14 VITALS — BP 140/64 | HR 64 | Temp 98.2°F | Ht 60.75 in | Wt 150.0 lb

## 2022-09-14 DIAGNOSIS — Z0000 Encounter for general adult medical examination without abnormal findings: Secondary | ICD-10-CM | POA: Diagnosis not present

## 2022-09-14 MED ORDER — TRAMADOL HCL 50 MG PO TABS: 50.0000 mg | ORAL_TABLET | Freq: Four times a day (QID) | ORAL | 2 refills | Status: DC | PRN

## 2022-09-14 NOTE — Progress Notes (Signed)
Taylor Wiley T. Mattox Schorr, MD, Taylor Bush at [facility] [place], [address]  Phone: [phone]  FAX: [phone]  Taylor Bush - 76 y.o. female  MRN [id number]  Date of Birth: 1945-11-13  Date: 09/14/2022  PCP: Owens Loffler, MD  Referral: Owens Loffler, MD  Chief Complaint  Patient presents with   Annual Exam    Part 2   Patient Care Team: Owens Loffler, MD as PCP - General Subjective:   [name] is a 76 y.o. pleasant patient who presents with the following:  Health Maintenance Summary Reviewed and updated, unless pt declines services.  Tobacco History Reviewed. Non-smoker Alcohol: No concerns, no excessive use Exercise Habits: exercise bike - will do two or three times a week, works in the yard STD concerns: none Drug Use: None Lumps or breast concerns: no  Covid booster - will get it.   Got flu shot.  RSV vaccine - will get it.   Overall, doing well  CKD 3, she does see Nephrology Anemia of chronic disease  Basic Metabolic Panel:    Component Value Date/Time   NA 138 09/07/2022 0859   K 4.6 09/07/2022 0859   CL 103 09/07/2022 0859   CO2 28 09/07/2022 0859   BUN 26 (H) 09/07/2022 0859   CREATININE 1.56 (H) 09/07/2022 0859   GLUCOSE 82 09/07/2022 0859   CALCIUM 9.6 09/07/2022 0859    CBC:    Component Value Date/Time   WBC 9.7 09/07/2022 0859   HGB 10.2 (L) 09/07/2022 0859   HCT 31.0 (L) 09/07/2022 0859   PLT 317.0 09/07/2022 0859   MCV 96.1 09/07/2022 0859   NEUTROABS 6.2 09/07/2022 0859   LYMPHSABS 2.0 09/07/2022 0859   MONOABS 1.0 09/07/2022 0859   EOSABS 0.5 09/07/2022 0859   BASOSABS 0.1 09/07/2022 0859     Health Maintenance  Topic Date Due   COVID-19 Vaccine (4 - Pfizer risk series) 11/17/2021   TETANUS/TDAP  04/28/2027 (Originally 07/21/1965)   Medicare Annual Wellness (AWV)  02/24/2023   COLONOSCOPY (Pts 45-70yr Insurance coverage will need to be confirmed)   09/08/2025   Pneumonia Vaccine 76 Years old  Completed   INFLUENZA VACCINE  Completed   DEXA SCAN  Completed   Hepatitis C Screening  Completed   Zoster Vaccines- Shingrix  Completed   HPV VACCINES  Aged Out    Immunization History  Administered Date(s) Administered   Fluad Quad(high Dose 65+) 09/07/2022   Influenza, High Dose Seasonal PF 08/23/2016, 08/24/2017, 08/16/2018, 08/11/2020, 08/26/2021   Influenza-Unspecified 07/08/2014, 08/08/2015, 08/08/2015   PFIZER(Purple Top)SARS-COV-2 Vaccination 01/01/2020, 01/22/2020   Pfizer Covid-19 Vaccine Bivalent Booster 117yr& up 09/22/2021   Pneumococcal Conjugate-13 03/04/2015   Pneumococcal Polysaccharide-23 09/05/2012   Zoster Recombinat (Shingrix) 08/05/2019, 12/03/2019   Patient Active Problem List   Diagnosis Date Noted   B12 deficiency 09/13/2021   Allergic rhinitis    CKD (chronic kidney disease) stage 3, GFR 30-59 ml/min ([facility]) 11/29/2013   Avascular necrosis of left femoral head ([facility]/06/2012   Anemia, iron deficiency 12/16/2011   Hx of total hip arthroplasty 12/15/2011   AVN (avascular necrosis of bone) Right Hip 10/04/2011   ATHEROSCLEROSIS W /INT CLAUDICATION 06/17/2009   Lumbar back pain with radiculopathy affecting left lower extremity 06/03/2009   HYPERLIPIDEMIA 12/15/2008   Essential hypertension 12/15/2008   UNSPECIFIED OPEN-ANGLE GLAUCOMA 10/27/2008    Past Medical History:  Diagnosis Date   Allergic rhinitis    Anemia in  chronic kidney disease (CKD)    Arthritis    bilateral hips   B12 deficiency 09/13/2021   Blood transfusion 2012   and 2013   Cataract    bilateral sx   CKD (chronic kidney disease) stage 3, GFR 30-59 ml/min (H[facility]) 11/29/2013   HLD (hyperlipidemia)    on meds   HTN (hypertension)    on meds   Hx of total hip arthroplasty 12/15/2011   Lumbar back pain    with radiculopathy   Open-angle glaucoma    PAD (peripheral artery disease) ([place])    with intermittent claudication, ABI 0.72  on Rt and 0.73 on L when assessed in 2010.    Seasonal allergies     Past Surgical History:  Procedure Laterality Date   abnormal vascular studies  05/07/2009   ABI's 70, R SFA > 50%^ blockage    CATARACT EXTRACTION, BILATERAL  2011   EXCISION OF TONGUE LESION WITH LASER N/A 08/31/2015   Procedure: BIOPSY AND CO2 LASER OF TONGUE LESION ;  Surgeon: Izora Gala, MD;  Location: [place];  Service: ENT;  Laterality: N/A;   EXCISION OF TONGUE LESION WITH LASER Left 06/29/2020   Procedure: EXCISION OF TONGUE LESION/CO2 laser w/biopsy;  Surgeon: Izora Gala, MD;  Location: [place];  Service: ENT;  Laterality: Left;   EXCISION ORAL LESION WITH CO2 LASER N/A 12/08/2021   Procedure: EXCISION OF ORAL ORAL CAVITY LESION WITH CO2 LASER;  Surgeon: Izora Gala, MD;  Location: [place];  Service: ENT;  Laterality: N/A;   OTHER SURGICAL HISTORY     ganglion cyst removed left wrist    TOTAL HIP ARTHROPLASTY  10/03/2011   Procedure: TOTAL HIP ARTHROPLASTY;  Surgeon: Gearlean Alf;  [facility];  Service: Orthopedics;  Laterality: Right;   TOTAL HIP ARTHROPLASTY  12/21/2011   Procedure: TOTAL HIP ARTHROPLASTY;  Surgeon: Gearlean Alf, MD;  [facility];  Service: Orthopedics;  Laterality: Left;   TUBAL LIGATION      Family History  Problem Relation Age of Onset   Colon polyps Mother 85   Heart attack Father        71s   Stroke Neg Hx        also no PAD or aneurysm   Colon cancer Neg Hx    Esophageal cancer Neg Hx    Stomach cancer Neg Hx    Rectal cancer Neg Hx     Social History   Social History Narrative   Retired Secretary/administrator, 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     Past Medical History, Surgical History, Social History, Family History, Problem List, Medications, and Allergies have been reviewed and updated if relevant.  Review of Systems: Pertinent positives are listed above.  Otherwise, a full 14 point review of systems has  been done in full and it is negative except where it is noted positive.  Objective:   BP (!) 140/64   Pulse 64   Temp 98.2 F (36.8 C) (Oral)   Ht 5' 0.75" (1.543 m)   Wt 150 lb (68 kg)   SpO2 96%   BMI 28.58 kg/m  Ideal Body Weight: Weight in (lb) to have BMI = 25: 131 No results found.    02/23/2022   10:12 AM 09/13/2021    2:40 PM 08/31/2020    2:35 PM 05/09/2019    2:22 PM 05/01/2018   11:50 AM  Depression screen PHQ 2/9  Decreased Interest 0 0 0  0 0  Down, Depressed, Hopeless 0  0 0 0  PHQ - 2 Score 0 0 0 0 0  Altered sleeping     0  Tired, decreased energy     0  Change in appetite     0  Feeling bad or failure about yourself      0  Trouble concentrating     0  Moving slowly or fidgety/restless     0  Suicidal thoughts     0  PHQ-9 Score     0  Difficult doing work/chores     Not difficult at all     GEN: well developed, well nourished, no acute distress Eyes: conjunctiva and lids normal, PERRLA, EOMI ENT: TM clear, nares clear, oral exam WNL Neck: supple, no lymphadenopathy, no thyromegaly, no JVD Pulm: clear to auscultation and percussion, respiratory effort normal CV: regular rate and rhythm, S1-S2, no murmur, rub or gallop, no bruits Chest: no scars, masses, no lumps BREAST: breast exam declined GI: soft, non-tender; no hepatosplenomegaly, masses; active bowel sounds all quadrants GU: GU exam declined Lymph: no cervical, axillary or inguinal adenopathy MSK: gait normal, muscle tone and strength WNL, no joint swelling, effusions, discoloration, crepitus  SKIN: clear, good turgor, color WNL, no rashes, lesions, or ulcerations Neuro: normal mental status, normal strength, sensation, and motion Psych: alert; oriented to person, place and time, normally interactive and not anxious or depressed in appearance.   All labs reviewed with patient. Results for orders placed or performed in visit on 09/07/22  IBC + Ferritin  Result Value Ref Range   Iron 80 42 -  145 ug/dL   Transferrin 210.0 (L) 212.0 - 360.0 mg/dL   Saturation Ratios 27.2 20.0 - 50.0 %   Ferritin 387.8 (H) 10.0 - 291.0 ng/mL   TIBC 294.0 250.0 - 450.0 mcg/dL  Hemoglobin A1c  Result Value Ref Range   Hgb A1c MFr Bld 5.9 4.6 - 6.5 %  TSH  Result Value Ref Range   TSH 2.29 0.35 - 5.50 uIU/mL  VITAMIN D 25 Hydroxy (Vit-D Deficiency, Fractures)  Result Value Ref Range   VITD 37.20 30.00 - 100.00 ng/mL  Vitamin B12  Result Value Ref Range   Vitamin B-12 221 211 - 911 pg/mL  CBC with Differential/Platelet  Result Value Ref Range   WBC 9.7 4.0 - 10.5 K/uL   RBC 3.23 (L) 3.87 - 5.11 Mil/uL   Hemoglobin 10.2 (L) 12.0 - 15.0 g/dL   HCT 31.0 (L) 36.0 - 46.0 %   MCV 96.1 78.0 - 100.0 fl   MCHC 32.9 30.0 - 36.0 g/dL   RDW 14.5 11.5 - 15.5 %   Platelets 317.0 150.0 - 400.0 K/uL   Neutrophils Relative % 63.4 43.0 - 77.0 %   Lymphocytes Relative 20.6 12.0 - 46.0 %   Monocytes Relative 10.0 3.0 - 12.0 %   Eosinophils Relative 4.7 0.0 - 5.0 %   Basophils Relative 1.3 0.0 - 3.0 %   Neutro Abs 6.2 1.4 - 7.7 K/uL   Lymphs Abs 2.0 0.7 - 4.0 K/uL   Monocytes Absolute 1.0 0.1 - 1.0 K/uL   Eosinophils Absolute 0.5 0.0 - 0.7 K/uL   Basophils Absolute 0.1 0.0 - 0.1 K/uL  Basic metabolic panel  Result Value Ref Range   Sodium 138 135 - 145 mEq/L   Potassium 4.6 3.5 - 5.1 mEq/L   Chloride 103 96 - 112 mEq/L   CO2 28 19 - 32 mEq/L   Glucose,  Bld 82 70 - 99 mg/dL   BUN 26 (H) 6 - 23 mg/dL   Creatinine, Ser 1.56 (H) 0.40 - 1.20 mg/dL   GFR 32.18 (L) >60.00 mL/min   Calcium 9.6 8.4 - 10.5 mg/dL  Hepatic function panel  Result Value Ref Range   Total Bilirubin 0.4 0.2 - 1.2 mg/dL   Bilirubin, Direct 0.1 0.0 - 0.3 mg/dL   Alkaline Phosphatase 54 39 - 117 U/L   AST 13 0 - 37 U/L   ALT 10 0 - 35 U/L   Total Protein 7.0 6.0 - 8.3 g/dL   Albumin 4.1 3.5 - 5.2 g/dL  Lipid panel  Result Value Ref Range   Cholesterol 217 (H) 0 - 200 mg/dL   Triglycerides 155.0 (H) 0.0 - 149.0 mg/dL   HDL  56.70 >39.00 mg/dL   VLDL 31.0 0.0 - 40.0 mg/dL   LDL Cholesterol 129 (H) 0 - 99 mg/dL   Total CHOL/HDL Ratio 4    NonHDL 160.46    No results found.  Assessment and Plan:     ICD-10-CM   1. Healthcare maintenance  Z00.00      Will update vaccines  Stable occ tramadol use for chronic back pain  Health Maintenance Exam: The patient's preventative maintenance and recommended screening tests for an annual wellness exam were reviewed in full today. Brought up to date unless services declined.  Counselled on the importance of diet, exercise, and its role in overall health and mortality. The patient's FH and SH was reviewed, including their home life, tobacco status, and drug and alcohol status.  Follow-up in 1 year for physical exam or additional follow-up below.  Disposition: No follow-ups on file.  No future appointments.  Meds ordered this encounter  Medications   traMADol (ULTRAM) 50 MG tablet    Sig: Take 1 tablet (50 mg total) by mouth every 6 (six) hours as needed.    Dispense:  50 tablet    Refill:  2    Not to exceed 3 additional fills before 10/30/2017   Medications Discontinued During This Encounter  Medication Reason   traMADol (ULTRAM) 50 MG tablet Reorder   OVER THE COUNTER MEDICATION Duplicate   No orders of the defined types were placed in this encounter.   Signed,  Maud Deed. Chelsy Parrales, MD   Allergies as of 09/14/2022       Reactions   Prednisone Other (See Comments)   Joint pain,joint stiffness   Statins Rash   "hands broke out"        Medication List        Accurate as of September 14, 2022  1:44 PM. If you have any questions, ask your nurse or doctor.          amLODipine 10 MG tablet Commonly known as: NORVASC TAKE 1 TABLET BY MOUTH DAILY   aspirin 81 MG tablet Take 81 mg by mouth daily.   CALCIUM 600 PO Take 900 mg by mouth daily.   Cholecalciferol 50 MCG (2000 UT) Caps Take 1 capsule by mouth daily.   lisinopril 20 MG  tablet Commonly known as: ZESTRIL TAKE 1 TABLET BY MOUTH DAILY   metoprolol 200 MG 24 hr tablet Commonly known as: TOPROL-XL TAKE 1 TABLET BY MOUTH DAILY   PRESERVISION AREDS PO Take 1 capsule by mouth daily at 6 (six) AM.   traMADol 50 MG tablet Commonly known as: ULTRAM Take 1 tablet (50 mg total) by mouth every 6 (six) hours as needed.

## 2022-10-11 DIAGNOSIS — H43813 Vitreous degeneration, bilateral: Secondary | ICD-10-CM | POA: Diagnosis not present

## 2022-10-11 DIAGNOSIS — H35033 Hypertensive retinopathy, bilateral: Secondary | ICD-10-CM | POA: Diagnosis not present

## 2022-10-11 DIAGNOSIS — H353231 Exudative age-related macular degeneration, bilateral, with active choroidal neovascularization: Secondary | ICD-10-CM | POA: Diagnosis not present

## 2022-10-14 ENCOUNTER — Other Ambulatory Visit: Payer: Self-pay | Admitting: Family Medicine

## 2022-10-17 ENCOUNTER — Other Ambulatory Visit: Payer: Self-pay | Admitting: Family Medicine

## 2022-11-22 DIAGNOSIS — H353231 Exudative age-related macular degeneration, bilateral, with active choroidal neovascularization: Secondary | ICD-10-CM | POA: Diagnosis not present

## 2022-12-14 ENCOUNTER — Other Ambulatory Visit: Payer: Self-pay | Admitting: Family Medicine

## 2022-12-14 MED ORDER — TRAMADOL HCL 50 MG PO TABS: 50.0000 mg | ORAL_TABLET | Freq: Four times a day (QID) | ORAL | 2 refills | Status: DC | PRN

## 2022-12-14 NOTE — Telephone Encounter (Signed)
Prescription Request  12/14/2022  Is this a "Controlled Substance" medicine? No  LOV: 09/14/2022  What is the name of the medication or equipment? traMADol (ULTRAM) 50 MG tablet   Have you contacted your pharmacy to request a refill? No   Which pharmacy would you like this sent to?  [id number] [address] [place] [address]Phone: [phone]: [phone]   Patient notified that their request is being sent to the clinical staff for review and that they should receive a response within 2 business days.   [phone]

## 2022-12-14 NOTE — Telephone Encounter (Signed)
Last office visit 09/14/22 for CPE.  Last refilled 09/14/22 for #50 with 2 refills.  Next Appt:  No future appointments.

## 2023-01-03 DIAGNOSIS — H353231 Exudative age-related macular degeneration, bilateral, with active choroidal neovascularization: Secondary | ICD-10-CM | POA: Diagnosis not present

## 2023-02-14 DIAGNOSIS — H43813 Vitreous degeneration, bilateral: Secondary | ICD-10-CM | POA: Diagnosis not present

## 2023-02-14 DIAGNOSIS — H353231 Exudative age-related macular degeneration, bilateral, with active choroidal neovascularization: Secondary | ICD-10-CM | POA: Diagnosis not present

## 2023-02-14 DIAGNOSIS — H35033 Hypertensive retinopathy, bilateral: Secondary | ICD-10-CM | POA: Diagnosis not present

## 2023-02-28 DIAGNOSIS — I129 Hypertensive chronic kidney disease with stage 1 through stage 4 chronic kidney disease, or unspecified chronic kidney disease: Secondary | ICD-10-CM | POA: Diagnosis not present

## 2023-02-28 DIAGNOSIS — N184 Chronic kidney disease, stage 4 (severe): Secondary | ICD-10-CM | POA: Diagnosis not present

## 2023-02-28 DIAGNOSIS — N2581 Secondary hyperparathyroidism of renal origin: Secondary | ICD-10-CM | POA: Diagnosis not present

## 2023-02-28 DIAGNOSIS — E871 Hypo-osmolality and hyponatremia: Secondary | ICD-10-CM | POA: Diagnosis not present

## 2023-02-28 DIAGNOSIS — D631 Anemia in chronic kidney disease: Secondary | ICD-10-CM | POA: Diagnosis not present

## 2023-03-07 DIAGNOSIS — N1832 Chronic kidney disease, stage 3b: Secondary | ICD-10-CM | POA: Diagnosis not present

## 2023-03-07 DIAGNOSIS — D631 Anemia in chronic kidney disease: Secondary | ICD-10-CM | POA: Diagnosis not present

## 2023-03-07 DIAGNOSIS — N2581 Secondary hyperparathyroidism of renal origin: Secondary | ICD-10-CM | POA: Diagnosis not present

## 2023-03-07 DIAGNOSIS — I129 Hypertensive chronic kidney disease with stage 1 through stage 4 chronic kidney disease, or unspecified chronic kidney disease: Secondary | ICD-10-CM | POA: Diagnosis not present

## 2023-03-28 DIAGNOSIS — H353231 Exudative age-related macular degeneration, bilateral, with active choroidal neovascularization: Secondary | ICD-10-CM | POA: Diagnosis not present

## 2023-05-03 ENCOUNTER — Ambulatory Visit (INDEPENDENT_AMBULATORY_CARE_PROVIDER_SITE_OTHER): Payer: Medicare Other

## 2023-05-03 VITALS — Ht 60.0 in | Wt 150.0 lb

## 2023-05-03 DIAGNOSIS — Z0000 Encounter for general adult medical examination without abnormal findings: Secondary | ICD-10-CM

## 2023-05-03 NOTE — Patient Instructions (Addendum)
Taylor Bush , Thank you for taking time to come for your Medicare Wellness Visit. I appreciate your ongoing commitment to your health goals. Please review the following plan we discussed and let me know if I can assist you in the future.   These are the goals we discussed:  Goals      healthy food choices     Starting 04/27/2017, I will reduce of intake of ice cream and increase intake of fruits and vegetables to 4-5 servings per day.     Patient Stated     Starting 05/01/2018, I will continue to take medications as prescribed.      Patient Stated     Would like to drink more water, eat healthier and be more active      Patient Stated     Walk more and drink 6-8 glasses of water a day.        This is a list of the screening recommended for you and due dates:  Health Maintenance  Topic Date Due   DTaP/Tdap/Td vaccine (1 - Tdap) Never done   COVID-19 Vaccine (4 - 2023-24 season) 07/08/2022   Flu Shot  06/08/2023   Medicare Annual Wellness Visit  05/02/2024   Colon Cancer Screening  09/08/2025   Pneumonia Vaccine  Completed   DEXA scan (bone density measurement)  Completed   Hepatitis C Screening  Completed   Zoster (Shingles) Vaccine  Completed   HPV Vaccine  Aged Out   Managing Pain Without Opioids Opioids are strong medicines used to treat moderate to severe pain. For some people, especially those who have long-term (chronic) pain, opioids may not be the best choice for pain management due to: Side effects like nausea, constipation, and sleepiness. The risk of addiction (opioid use disorder). The longer you take opioids, the greater your risk of addiction. Pain that lasts for more than 3 months is called chronic pain. Managing chronic pain usually requires more than one approach and is often provided by a team of health care providers working together (multidisciplinary approach). Pain management may be done at a pain management center or pain clinic. How to manage pain without  the use of opioids Use non-opioid medicines Non-opioid medicines for pain may include: Over-the-counter or prescription non-steroidal anti-inflammatory drugs (NSAIDs). These may be the first medicines used for pain. They work well for muscle and bone pain, and they reduce swelling. Acetaminophen. This over-the-counter medicine may work well for milder pain but not swelling. Antidepressants. These may be used to treat chronic pain. A certain type of antidepressant (tricyclics) is often used. These medicines are given in lower doses for pain than when used for depression. Anticonvulsants. These are usually used to treat seizures but may also reduce nerve (neuropathic) pain. Muscle relaxants. These relieve pain caused by sudden muscle tightening (spasms). You may also use a pain medicine that is applied to the skin as a patch, cream, or gel (topical analgesic), such as a numbing medicine. These may cause fewer side effects than medicines taken by mouth. Do certain therapies as directed Some therapies can help with pain management. They include: Physical therapy. You will do exercises to gain strength and flexibility. A physical therapist may teach you exercises to move and stretch parts of your body that are weak, stiff, or painful. You can learn these exercises at physical therapy visits and practice them at home. Physical therapy may also involve: Massage. Heat wraps or applying heat or cold to affected areas. Electrical signals that  interrupt pain signals (transcutaneous electrical nerve stimulation, TENS). Weak lasers that reduce pain and swelling (low-level laser therapy). Signals from your body that help you learn to regulate pain (biofeedback). Occupational therapy. This helps you to learn ways to function at home and work with less pain. Recreational therapy. This involves trying new activities or hobbies, such as a physical activity or drawing. Mental health therapy, including: Cognitive  behavioral therapy (CBT). This helps you learn coping skills for dealing with pain. Acceptance and commitment therapy (ACT) to change the way you think and react to pain. Relaxation therapies, including muscle relaxation exercises and mindfulness-based stress reduction. Pain management counseling. This may be individual, family, or group counseling.  Receive medical treatments Medical treatments for pain management include: Nerve block injections. These may include a pain blocker and anti-inflammatory medicines. You may have injections: Near the spine to relieve chronic back or neck pain. Into joints to relieve back or joint pain. Into nerve areas that supply a painful area to relieve body pain. Into muscles (trigger point injections) to relieve some painful muscle conditions. A medical device placed near your spine to help block pain signals and relieve nerve pain or chronic back pain (spinal cord stimulation device). Acupuncture. Follow these instructions at home Medicines Take over-the-counter and prescription medicines only as told by your health care provider. If you are taking pain medicine, ask your health care providers about possible side effects to watch out for. Do not drive or use heavy machinery while taking prescription opioid pain medicine. Lifestyle  Do not use drugs or alcohol to reduce pain. If you drink alcohol, limit how much you have to: 0-1 drink a day for women who are not pregnant. 0-2 drinks a day for men. Know how much alcohol is in a drink. In the [place] one drink equals one 12 oz bottle of beer (355 mL), one 5 oz glass of wine (148 mL), or one 1 oz glass of hard liquor (44 mL). Do not use any products that contain nicotine or tobacco. These products include cigarettes, chewing tobacco, and vaping devices, such as e-cigarettes. If you need help quitting, ask your health care provider. Eat a healthy diet and maintain a healthy weight. Poor diet and excess weight  may make pain worse. Eat foods that are high in fiber. These include fresh fruits and vegetables, whole grains, and beans. Limit foods that are high in fat and processed sugars, such as fried and sweet foods. Exercise regularly. Exercise lowers stress and may help relieve pain. Ask your health care provider what activities and exercises are safe for you. If your health care provider approves, join an exercise class that combines movement and stress reduction. Examples include yoga and tai chi. Get enough sleep. Lack of sleep may make pain worse. Lower stress as much as possible. Practice stress reduction techniques as told by your therapist. General instructions Work with all your pain management providers to find the treatments that work best for you. You are an important member of your pain management team. There are many things you can do to reduce pain on your own. Consider joining an online or in-person support group for people who have chronic pain. Keep all follow-up visits. This is important. Where to find more information You can find more information about managing pain without opioids from: American Academy of Pain Medicine: painmed.org Institute for Chronic Pain: instituteforchronicpain.org American Chronic Pain Association: theacpa.org Contact a health care provider if: You have side effects from pain medicine. Your  pain gets worse or does not get better with treatments or home therapy. You are struggling with anxiety or depression. Summary Many types of pain can be managed without opioids. Chronic pain may respond better to pain management without opioids. Pain is best managed when you and a team of health care providers work together. Pain management without opioids may include non-opioid medicines, medical treatments, physical therapy, mental health therapy, and lifestyle changes. Tell your health care providers if your pain gets worse or is not being managed well  enough. This information is not intended to replace advice given to you by your health care provider. Make sure you discuss any questions you have with your health care provider. Document Revised: 02/03/2021 Document Reviewed: 02/03/2021 Elsevier Patient Education  2024 [employer]   Advanced directives: none  Conditions/risks identified: Aim for 30 minutes of exercise or brisk walking, 6-8 glasses of water, and 5 servings of fruits and vegetables each day.   Next appointment: Follow up in one year for your annual wellness visit 05/06/24 @ 11:15 telephone call   Preventive Care 65 Years and Older, Female Preventive care refers to lifestyle choices and visits with your health care provider that can promote health and wellness. What does preventive care include? A yearly physical exam. This is also called an annual well check. Dental exams once or twice a year. Routine eye exams. Ask your health care provider how often you should have your eyes checked. Personal lifestyle choices, including: Daily care of your teeth and gums. Regular physical activity. Eating a healthy diet. Avoiding tobacco and drug use. Limiting alcohol use. Practicing safe sex. Taking low-dose aspirin every day. Taking vitamin and mineral supplements as recommended by your health care provider. What happens during an annual well check? The services and screenings done by your health care provider during your annual well check will depend on your age, overall health, lifestyle risk factors, and family history of disease. Counseling  Your health care provider may ask you questions about your: Alcohol use. Tobacco use. Drug use. Emotional well-being. Home and relationship well-being. Sexual activity. Eating habits. History of falls. Memory and ability to understand (cognition). Work and work Astronomer. Reproductive health. Screening  You may have the following tests or measurements: Height, weight, and  BMI. Blood pressure. Lipid and cholesterol levels. These may be checked every 5 years, or more frequently if you are over 28 years old. Skin check. Lung cancer screening. You may have this screening every year starting at age 85 if you have a 30-pack-year history of smoking and currently smoke or have quit within the past 15 years. Fecal occult blood test (FOBT) of the stool. You may have this test every year starting at age 54. Flexible sigmoidoscopy or colonoscopy. You may have a sigmoidoscopy every 5 years or a colonoscopy every 10 years starting at age 60. Hepatitis C blood test. Hepatitis B blood test. Sexually transmitted disease (STD) testing. Diabetes screening. This is done by checking your blood sugar (glucose) after you have not eaten for a while (fasting). You may have this done every 1-3 years. Bone density scan. This is done to screen for osteoporosis. You may have this done starting at age 86. Mammogram. This may be done every 1-2 years. Talk to your health care provider about how often you should have regular mammograms. Talk with your health care provider about your test results, treatment options, and if necessary, the need for more tests. Vaccines  Your health care provider may recommend  certain vaccines, such as: Influenza vaccine. This is recommended every year. Tetanus, diphtheria, and acellular pertussis (Tdap, Td) vaccine. You may need a Td booster every 10 years. Zoster vaccine. You may need this after age 38. Pneumococcal 13-valent conjugate (PCV13) vaccine. One dose is recommended after age 4. Pneumococcal polysaccharide (PPSV23) vaccine. One dose is recommended after age 36. Talk to your health care provider about which screenings and vaccines you need and how often you need them. This information is not intended to replace advice given to you by your health care provider. Make sure you discuss any questions you have with your health care provider. Document  Released: 11/20/2015 Document Revised: 07/13/2016 Document Reviewed: 08/25/2015 Elsevier Interactive Patient Education  2017 [employer].  Fall Prevention in the Home Falls can cause injuries. They can happen to people of all ages. There are many things you can do to make your home safe and to help prevent falls. What can I do on the outside of my home? Regularly fix the edges of walkways and driveways and fix any cracks. Remove anything that might make you trip as you walk through a door, such as a raised step or threshold. Trim any bushes or trees on the path to your home. Use bright outdoor lighting. Clear any walking paths of anything that might make someone trip, such as rocks or tools. Regularly check to see if handrails are loose or broken. Make sure that both sides of any steps have handrails. Any raised decks and porches should have guardrails on the edges. Have any leaves, snow, or ice cleared regularly. Use sand or salt on walking paths during winter. Clean up any spills in your garage right away. This includes oil or grease spills. What can I do in the bathroom? Use night lights. Install grab bars by the toilet and in the tub and shower. Do not use towel bars as grab bars. Use non-skid mats or decals in the tub or shower. If you need to sit down in the shower, use a plastic, non-slip stool. Keep the floor dry. Clean up any water that spills on the floor as soon as it happens. Remove soap buildup in the tub or shower regularly. Attach bath mats securely with double-sided non-slip rug tape. Do not have throw rugs and other things on the floor that can make you trip. What can I do in the bedroom? Use night lights. Make sure that you have a light by your bed that is easy to reach. Do not use any sheets or blankets that are too big for your bed. They should not hang down onto the floor. Have a firm chair that has side arms. You can use this for support while you get dressed. Do  not have throw rugs and other things on the floor that can make you trip. What can I do in the kitchen? Clean up any spills right away. Avoid walking on wet floors. Keep items that you use a lot in easy-to-reach places. If you need to reach something above you, use a strong step stool that has a grab bar. Keep electrical cords out of the way. Do not use floor polish or wax that makes floors slippery. If you must use wax, use non-skid floor wax. Do not have throw rugs and other things on the floor that can make you trip. What can I do with my stairs? Do not leave any items on the stairs. Make sure that there are handrails on both sides of the  stairs and use them. Fix handrails that are broken or loose. Make sure that handrails are as long as the stairways. Check any carpeting to make sure that it is firmly attached to the stairs. Fix any carpet that is loose or worn. Avoid having throw rugs at the top or bottom of the stairs. If you do have throw rugs, attach them to the floor with carpet tape. Make sure that you have a light switch at the top of the stairs and the bottom of the stairs. If you do not have them, ask someone to add them for you. What else can I do to help prevent falls? Wear shoes that: Do not have high heels. Have rubber bottoms. Are comfortable and fit you well. Are closed at the toe. Do not wear sandals. If you use a stepladder: Make sure that it is fully opened. Do not climb a closed stepladder. Make sure that both sides of the stepladder are locked into place. Ask someone to hold it for you, if possible. Clearly mark and make sure that you can see: Any grab bars or handrails. First and last steps. Where the edge of each step is. Use tools that help you move around (mobility aids) if they are needed. These include: Canes. Walkers. Scooters. Crutches. Turn on the lights when you go into a dark area. Replace any light bulbs as soon as they burn out. Set up your  furniture so you have a clear path. Avoid moving your furniture around. If any of your floors are uneven, fix them. If there are any pets around you, be aware of where they are. Review your medicines with your doctor. Some medicines can make you feel dizzy. This can increase your chance of falling. Ask your doctor what other things that you can do to help prevent falls. This information is not intended to replace advice given to you by your health care provider. Make sure you discuss any questions you have with your health care provider. Document Released: 08/20/2009 Document Revised: 03/31/2016 Document Reviewed: 11/28/2014 Elsevier Interactive Patient Education  2017 [employer].

## 2023-05-03 NOTE — Progress Notes (Signed)
Subjective:   Taylor Bush is a 77 y.o. female who presents for Medicare Annual (Subsequent) preventive examination.  Visit Complete: Virtual  I connected with  EMSLEY CUSTER on 05/03/23 by a audio enabled telemedicine application and verified that I am speaking with the correct person using two identifiers.  Patient Location: Home  Provider Location: Office/Clinic  I discussed the limitations of evaluation and management by telemedicine. The patient expressed understanding and agreed to proceed.   Review of Systems      Cardiac Risk Factors include: advanced age (>39men, >9 women);hypertension;dyslipidemia;smoking/ tobacco exposure;sedentary lifestyle     Objective:    Today's Vitals   05/03/23 1151  Weight: 150 lb (68 kg)  Height: 5' (1.524 m)   Body mass index is 29.29 kg/m.     05/03/2023   11:56 AM 02/23/2022   10:09 AM 12/08/2021    9:09 AM 06/29/2020    8:33 AM 06/23/2020    2:41 PM 05/01/2018   11:56 AM 07/11/2017   10:17 AM  Advanced Directives  Does Patient Have a Medical Advance Directive? No No No No No No No  Would patient like information on creating a medical advance directive? No - Patient declined Yes (MAU/Ambulatory/Procedural Areas - Information given) No - Patient declined No - Patient declined No - Patient declined No - Patient declined     Current Medications (verified) Outpatient Encounter Medications as of 05/03/2023  Medication Sig   amLODipine (NORVASC) 10 MG tablet TAKE 1 TABLET BY MOUTH DAILY   aspirin 81 MG tablet Take 81 mg by mouth daily.   Calcium Carbonate (CALCIUM 600 PO) Take 900 mg by mouth daily.   Cholecalciferol 50 MCG (2000 UT) CAPS Take 1 capsule by mouth daily.   lisinopril (ZESTRIL) 20 MG tablet TAKE 1 TABLET BY MOUTH DAILY   metoprolol (TOPROL-XL) 200 MG 24 hr tablet TAKE 1 TABLET BY MOUTH DAILY   Multiple Vitamins-Minerals (PRESERVISION AREDS PO) Take 1 capsule by mouth daily at 6 (six) AM.   traMADol (ULTRAM) 50 MG tablet  Take 1 tablet (50 mg total) by mouth every 6 (six) hours as needed.   No facility-administered encounter medications on file as of 05/03/2023.    Allergies (verified) Prednisone and Statins   History: Past Medical History:  Diagnosis Date   Allergic rhinitis    Anemia in chronic kidney disease (CKD)    B12 deficiency 09/13/2021   Blood transfusion 2012   and 2013   Cataract    bilateral sx   Chronic low back pain    with radiculopathy   CKD (chronic kidney disease) stage 3, GFR 30-59 ml/min (HCC) 11/29/2013   HLD (hyperlipidemia)    on meds   HTN (hypertension)    on meds   Hx of total hip arthroplasty 12/15/2011   Open-angle glaucoma    PAD (peripheral artery disease) (HCC)    with intermittent claudication, ABI 0.72 on Rt and 0.73 on L when assessed in 2010.    Past Surgical History:  Procedure Laterality Date   abnormal vascular studies  05/07/2009   ABI's 70, R SFA > 50%^ blockage    CATARACT EXTRACTION, BILATERAL  2011   EXCISION OF TONGUE LESION WITH LASER N/A 08/31/2015   Procedure: BIOPSY AND CO2 LASER OF TONGUE LESION ;  Surgeon: Serena Colonel, MD;  Location: [facility];  Service: ENT;  Laterality: N/A;   EXCISION OF TONGUE LESION WITH LASER Left 06/29/2020   Procedure: EXCISION OF TONGUE LESION/CO2  laser w/biopsy;  Surgeon: Serena Colonel, MD;  Location: [facility];  Service: ENT;  Laterality: Left;   EXCISION ORAL LESION WITH CO2 LASER N/A 12/08/2021   Procedure: EXCISION OF ORAL ORAL CAVITY LESION WITH CO2 LASER;  Surgeon: Serena Colonel, MD;  Location: [facility];  Service: ENT;  Laterality: N/A;   OTHER SURGICAL HISTORY     ganglion cyst removed left wrist    TOTAL HIP ARTHROPLASTY  10/03/2011   Procedure: TOTAL HIP ARTHROPLASTY;  Surgeon: Loanne Drilling;  Location: [facility];  Service: Orthopedics;  Laterality: Right;   TOTAL HIP ARTHROPLASTY  12/21/2011   Procedure: TOTAL HIP ARTHROPLASTY;  Surgeon: Loanne Drilling, MD;   Location: [facility];  Service: Orthopedics;  Laterality: Left;   TUBAL LIGATION     Family History  Problem Relation Age of Onset   Colon polyps Mother 60   Heart attack Father        70s   Stroke Neg Hx        also no PAD or aneurysm   Colon cancer Neg Hx    Esophageal cancer Neg Hx    Stomach cancer Neg Hx    Rectal cancer Neg Hx    Social History   Socioeconomic History   Marital status: Widowed    Spouse name: Not on file   Number of children: Not on file   Years of education: Not on file   Highest education level: Not on file  Occupational History   Not on file  Tobacco Use   Smoking status: Former    Types: Cigarettes    Quit date: 11/07/2008    Years since quitting: 14.4   Smokeless tobacco: Never  Vaping Use   Vaping Use: Never used  Substance and Sexual Activity   Alcohol use: No   Drug use: No   Sexual activity: Not on file  Other Topics Concern   Not on file  Social History Narrative   Retired Haematologist, 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    Social Determinants of Health   Financial Resource Strain: Low Risk  (05/03/2023)   Overall Financial Resource Strain (CARDIA)    Difficulty of Paying Living Expenses: Not hard at all  Food Insecurity: No Food Insecurity (05/03/2023)   Hunger Vital Sign    Worried About Running Out of Food in the Last Year: Never true    Ran Out of Food in the Last Year: Never true  Transportation Needs: No Transportation Needs (05/03/2023)   PRAPARE - Administrator, Civil Service (Medical): No    Lack of Transportation (Non-Medical): No  Physical Activity: Insufficiently Active (05/03/2023)   Exercise Vital Sign    Days of Exercise per Week: 7 days    Minutes of Exercise per Session: 20 min  Stress: No Stress Concern Present (05/03/2023)   [employer] of Occupational Health - Occupational Stress Questionnaire    Feeling of Stress : Not at all  Social Connections: Moderately Isolated (05/03/2023)   Social Connection  and Isolation Panel [NHANES]    Frequency of Communication with Friends and Family: More than three times a week    Frequency of Social Gatherings with Friends and Family: More than three times a week    Attends Religious Services: More than 4 times per year    Active Member of [employer] or Organizations: No    Attends Banker Meetings: Never    Marital Status: Widowed    Tobacco  Counseling Counseling given: Not Answered   Clinical Intake:  Pre-visit preparation completed: Yes  Pain : No/denies pain     BMI - recorded: 29.29 Nutritional Risks: None Diabetes: No  How often do you need to have someone help you when you read instructions, pamphlets, or other written materials from your doctor or pharmacy?: 1 - Never  Interpreter Needed?: No  Information entered by :: C.Keylie Beavers LPN   Activities of Daily Living    05/03/2023   11:57 AM  In your present state of health, do you have any difficulty performing the following activities:  Hearing? 0  Vision? 0  Difficulty concentrating or making decisions? 0  Walking or climbing stairs? 0  Dressing or bathing? 0  Doing errands, shopping? 0  Preparing Food and eating ? N  Using the Toilet? N  In the past six months, have you accidently leaked urine? N  Do you have problems with loss of bowel control? N  Managing your Medications? N  Managing your Finances? N  Housekeeping or managing your Housekeeping? N    Patient Care Team: Hannah Beat, MD as PCP - General  Indicate any recent Medical Services you may have received from other than [facility] providers in the past year (date may be approximate).     Assessment:   This is a routine wellness examination for Malia.  Hearing/Vision screen Hearing Screening - Comments:: No issues Vision Screening - Comments:: Glasses - [facility] - UTD on exams  Dietary issues and exercise activities discussed:     Goals Addressed             This Visit's Progress     healthy food choices   On track    Starting 04/27/2017, I will reduce of intake of ice cream and increase intake of fruits and vegetables to 4-5 servings per day.     Patient Stated       Walk more and drink 6-8 glasses of water a day.       Depression Screen    05/03/2023   11:56 AM 02/23/2022   10:12 AM 09/13/2021    2:40 PM 08/31/2020    2:35 PM 05/09/2019    2:22 PM 05/01/2018   11:50 AM 04/27/2017   11:25 AM  PHQ 2/9 Scores  PHQ - 2 Score 0 0 0 0 0 0 0  PHQ- 9 Score      0     Fall Risk    05/03/2023   11:57 AM 02/23/2022   10:11 AM 09/13/2021    2:40 PM 08/31/2020    2:35 PM 05/09/2019    2:22 PM  Fall Risk   Falls in the past year? 0 0 0 1 0  Number falls in past yr: 0 0 0 0   Injury with Fall? 0 0 0 1   Risk for fall due to : No Fall Risks No Fall Risks     Follow up Falls prevention discussed;Falls evaluation completed Falls prevention discussed       MEDICARE RISK AT HOME:  Medicare Risk at Home - 05/03/23 1157     Any stairs in or around the home? No    If so, are there any without handrails? No    Home free of loose throw rugs in walkways, pet beds, electrical cords, etc? Yes    Adequate lighting in your home to reduce risk of falls? Yes    Life alert? No    Use of a cane, walker  or w/c? No    Grab bars in the bathroom? No    Shower chair or bench in shower? Yes    Elevated toilet seat or a handicapped toilet? No             TIMED UP AND GO:  Was the test performed?  No    Cognitive Function:    05/01/2018   11:52 AM 04/27/2017   11:56 AM  MMSE - Mini Mental State Exam  Orientation to time 5 5  Orientation to Place 5 5  Registration 3 3  Attention/ Calculation 0 0  Recall 3 3  Language- name 2 objects 0 0  Language- repeat 1 1  Language- follow 3 step command 3 3  Language- read & follow direction 0 0  Write a sentence 0 0  Copy design 0 0  Total score 20 20        05/03/2023   12:00 PM  6CIT Screen  What Year? 0 points  What month?  0 points  What time? 0 points  Count back from 20 0 points  Months in reverse 0 points  Repeat phrase 0 points  Total Score 0 points    Immunizations Immunization History  Administered Date(s) Administered   Fluad Quad(high Dose 65+) 09/07/2022   Influenza, High Dose Seasonal PF 08/23/2016, 08/24/2017, 08/16/2018, 08/11/2020, 08/26/2021   Influenza-Unspecified 07/08/2014, 08/08/2015, 08/08/2015   PFIZER(Purple Top)SARS-COV-2 Vaccination 01/01/2020, 01/22/2020   Pfizer Covid-19 Vaccine Bivalent Booster 84yrs & up 09/22/2021   Pneumococcal Conjugate-13 03/04/2015   Pneumococcal Polysaccharide-23 09/05/2012   Zoster Recombinat (Shingrix) 08/05/2019, 12/03/2019    TDAP status: Up to date  Flu Vaccine status: Up to date  Pneumococcal vaccine status: Up to date  Covid-19 vaccine status: Information provided on how to obtain vaccines.   Qualifies for Shingles Vaccine? Yes   Zostavax completed Yes   Shingrix Completed?: Yes  Screening Tests Health Maintenance  Topic Date Due   DTaP/Tdap/Td (1 - Tdap) Never done   COVID-19 Vaccine (4 - 2023-24 season) 07/08/2022   INFLUENZA VACCINE  06/08/2023   Medicare Annual Wellness (AWV)  05/02/2024   Colonoscopy  09/08/2025   Pneumonia Vaccine 62+ Years old  Completed   DEXA SCAN  Completed   Hepatitis C Screening  Completed   Zoster Vaccines- Shingrix  Completed   HPV VACCINES  Aged Out    Health Maintenance  Health Maintenance Due  Topic Date Due   DTaP/Tdap/Td (1 - Tdap) Never done   COVID-19 Vaccine (4 - 2023-24 season) 07/08/2022    Colorectal cancer screening: Type of screening: Colonoscopy. Completed 09/08/22. Repeat every 3 years  Mammogram status: Completed 06/16/22. Repeat every year Mammogram scheduled for 08/24  Bone Density status: Completed 06/16/22. Results reflect: Bone density results: OSTEOPENIA. Repeat every 2 years.  Lung Cancer Screening: (Low Dose CT Chest recommended if Age 24-80 years, 20 pack-year  currently smoking OR have quit w/in 15years.) does not qualify.   Lung Cancer Screening Referral: no  Additional Screening:  Hepatitis C Screening: does qualify; Completed 04/27/17  Vision Screening: Recommended annual ophthalmology exams for early detection of glaucoma and other disorders of the eye. Is the patient up to date with their annual eye exam?  Yes  Who is the provider or what is the name of the office in which the patient attends annual eye exams? [facility] If pt is not established with a provider, would they like to be referred to a provider to establish care? Yes .  Dental Screening: Recommended annual dental exams for proper oral hygiene    Community Resource Referral / Chronic Care Management: CRR required this visit?  No   CCM required this visit?  No     Plan:     I have personally reviewed and noted the following in the patient's chart:   Medical and social history Use of alcohol, tobacco or illicit drugs  Current medications and supplements including opioid prescriptions. Patient is currently taking opioid prescriptions. Information provided to patient regarding non-opioid alternatives. Patient advised to discuss non-opioid treatment plan with their provider. Functional ability and status Nutritional status Physical activity Advanced directives List of other physicians Hospitalizations, surgeries, and ER visits in previous 12 months Vitals Screenings to include cognitive, depression, and falls Referrals and appointments  In addition, I have reviewed and discussed with patient certain preventive protocols, quality metrics, and best practice recommendations. A written personalized care plan for preventive services as well as general preventive health recommendations were provided to patient.     Maryan Puls, LPN   1/61/0960   After Visit Summary: (MyChart) Due to this being a telephonic visit, the after visit summary with patients  personalized plan was offered to patient via MyChart   Nurse Notes: none

## 2023-05-09 DIAGNOSIS — H353231 Exudative age-related macular degeneration, bilateral, with active choroidal neovascularization: Secondary | ICD-10-CM | POA: Diagnosis not present

## 2023-05-31 ENCOUNTER — Other Ambulatory Visit: Payer: Self-pay | Admitting: Family Medicine

## 2023-05-31 MED ORDER — TRAMADOL HCL 50 MG PO TABS: 50.0000 mg | ORAL_TABLET | Freq: Four times a day (QID) | ORAL | 2 refills | Status: DC | PRN

## 2023-05-31 NOTE — Telephone Encounter (Signed)
Prescription Request  05/31/2023  LOV: 09/14/2022  What is the name of the medication or equipment? traMADol (ULTRAM) 50 MG tablet   Have you contacted your pharmacy to request a refill? No   Which pharmacy would you like this sent to?  [id number] Judithann Sheen, [place] - [address] [place] [address] Phone: [phone] Fax: [phone]     Patient notified that their request is being sent to the clinical staff for review and that they should receive a response within 2 business days.   Please advise at [facility] [phone]   Scheduled pt's cpe for 09/21/23

## 2023-05-31 NOTE — Telephone Encounter (Signed)
Last visit 09/14/22 for CPE.  Last refilled 12/14/2022 for #50 with 2 refills.  Next Appt: CPE 09/21/2023.

## 2023-06-20 DIAGNOSIS — H353231 Exudative age-related macular degeneration, bilateral, with active choroidal neovascularization: Secondary | ICD-10-CM | POA: Diagnosis not present

## 2023-06-22 DIAGNOSIS — Z1231 Encounter for screening mammogram for malignant neoplasm of breast: Secondary | ICD-10-CM | POA: Diagnosis not present

## 2023-06-22 LAB — HM MAMMOGRAPHY

## 2023-06-26 ENCOUNTER — Encounter: Payer: Self-pay | Admitting: Family Medicine

## 2023-07-01 ENCOUNTER — Other Ambulatory Visit: Payer: Self-pay | Admitting: Family Medicine

## 2023-07-20 ENCOUNTER — Telehealth: Payer: Self-pay | Admitting: Family Medicine

## 2023-07-20 NOTE — Telephone Encounter (Signed)
Placed in Dr. Cyndie Chime office in box to complete upon his return to clinic on Monday 07/24/2023.

## 2023-07-20 NOTE — Telephone Encounter (Signed)
Patient dropped off document Handicap Placard, to be filled out by provider. Patient requested to send it back via Call Patient to pick up within ASAP. Document is located in providers tray at front office.Please advise at Mobile [phone] (mobile)

## 2023-07-24 NOTE — Telephone Encounter (Signed)
Taylor Bush notified by telephone that her handicap placard application is ready to be picked up at the front desk.

## 2023-08-01 DIAGNOSIS — Z961 Presence of intraocular lens: Secondary | ICD-10-CM | POA: Diagnosis not present

## 2023-08-01 DIAGNOSIS — H353231 Exudative age-related macular degeneration, bilateral, with active choroidal neovascularization: Secondary | ICD-10-CM | POA: Diagnosis not present

## 2023-08-01 DIAGNOSIS — H43813 Vitreous degeneration, bilateral: Secondary | ICD-10-CM | POA: Diagnosis not present

## 2023-08-01 DIAGNOSIS — H35033 Hypertensive retinopathy, bilateral: Secondary | ICD-10-CM | POA: Diagnosis not present

## 2023-08-14 DIAGNOSIS — K1321 Leukoplakia of oral mucosa, including tongue: Secondary | ICD-10-CM | POA: Diagnosis not present

## 2023-09-01 ENCOUNTER — Telehealth: Payer: Self-pay | Admitting: *Deleted

## 2023-09-01 DIAGNOSIS — E559 Vitamin D deficiency, unspecified: Secondary | ICD-10-CM

## 2023-09-01 DIAGNOSIS — E782 Mixed hyperlipidemia: Secondary | ICD-10-CM

## 2023-09-01 DIAGNOSIS — D508 Other iron deficiency anemias: Secondary | ICD-10-CM

## 2023-09-01 DIAGNOSIS — R7309 Other abnormal glucose: Secondary | ICD-10-CM

## 2023-09-01 DIAGNOSIS — E538 Deficiency of other specified B group vitamins: Secondary | ICD-10-CM

## 2023-09-01 DIAGNOSIS — R5383 Other fatigue: Secondary | ICD-10-CM

## 2023-09-01 NOTE — Telephone Encounter (Signed)
-----   Message from Alvina Chou sent at 08/31/2023 11:41 AM EDT ----- Regarding: Lab orders for [facility], 11.7.24 Patient is scheduled for CPX labs, please order future labs, Thanks , Camelia Eng

## 2023-09-09 ENCOUNTER — Other Ambulatory Visit: Payer: Self-pay | Admitting: Family Medicine

## 2023-09-12 DIAGNOSIS — H353231 Exudative age-related macular degeneration, bilateral, with active choroidal neovascularization: Secondary | ICD-10-CM | POA: Diagnosis not present

## 2023-09-14 ENCOUNTER — Other Ambulatory Visit (INDEPENDENT_AMBULATORY_CARE_PROVIDER_SITE_OTHER): Payer: Medicare Other

## 2023-09-14 DIAGNOSIS — R5383 Other fatigue: Secondary | ICD-10-CM

## 2023-09-14 DIAGNOSIS — R7309 Other abnormal glucose: Secondary | ICD-10-CM | POA: Diagnosis not present

## 2023-09-14 DIAGNOSIS — E559 Vitamin D deficiency, unspecified: Secondary | ICD-10-CM

## 2023-09-14 DIAGNOSIS — D508 Other iron deficiency anemias: Secondary | ICD-10-CM | POA: Diagnosis not present

## 2023-09-14 DIAGNOSIS — E538 Deficiency of other specified B group vitamins: Secondary | ICD-10-CM

## 2023-09-14 DIAGNOSIS — E782 Mixed hyperlipidemia: Secondary | ICD-10-CM

## 2023-09-14 LAB — BASIC METABOLIC PANEL
BUN: 21 mg/dL (ref 6–23)
CO2: 26 meq/L (ref 19–32)
Calcium: 9.1 mg/dL (ref 8.4–10.5)
Chloride: 102 meq/L (ref 96–112)
Creatinine, Ser: 1.84 mg/dL — ABNORMAL HIGH (ref 0.40–1.20)
GFR: 26.21 mL/min — ABNORMAL LOW (ref >60.00)
Glucose, Bld: 125 mg/dL — ABNORMAL HIGH (ref 70–99)
Potassium: 4.9 meq/L (ref 3.5–5.1)
Sodium: 135 meq/L (ref 135–145)

## 2023-09-14 LAB — CBC WITH DIFFERENTIAL/PLATELET
Basophils Absolute: 0.1 10*3/uL (ref 0.0–0.1)
Basophils Relative: 1 % (ref 0.0–3.0)
Eosinophils Absolute: 0.4 10*3/uL (ref 0.0–0.7)
Eosinophils Relative: 4.6 % (ref 0.0–5.0)
HCT: 30.7 % — ABNORMAL LOW (ref 36.0–46.0)
Hemoglobin: 10.1 g/dL — ABNORMAL LOW (ref 12.0–15.0)
Lymphocytes Relative: 24.8 % (ref 12.0–46.0)
Lymphs Abs: 2.1 10*3/uL (ref 0.7–4.0)
MCHC: 33.1 g/dL (ref 30.0–36.0)
MCV: 96.5 fl (ref 78.0–100.0)
Monocytes Absolute: 0.7 10*3/uL (ref 0.1–1.0)
Monocytes Relative: 8.7 % (ref 3.0–12.0)
Neutro Abs: 5.1 10*3/uL (ref 1.4–7.7)
Neutrophils Relative %: 60.9 % (ref 43.0–77.0)
Platelets: 324 10*3/uL (ref 150.0–400.0)
RBC: 3.18 Mil/uL — ABNORMAL LOW (ref 3.87–5.11)
RDW: 14.3 % (ref 11.5–15.5)
WBC: 8.3 10*3/uL (ref 4.0–10.5)

## 2023-09-14 LAB — LIPID PANEL
Cholesterol: 204 mg/dL — ABNORMAL HIGH (ref 0–200)
HDL: 57.8 mg/dL
LDL Cholesterol: 118 mg/dL — ABNORMAL HIGH (ref 0–99)
NonHDL: 145.81
Total CHOL/HDL Ratio: 4
Triglycerides: 137 mg/dL (ref 0.0–149.0)
VLDL: 27.4 mg/dL (ref 0.0–40.0)

## 2023-09-14 LAB — IBC + FERRITIN
Ferritin: 358.1 ng/mL — ABNORMAL HIGH (ref 10.0–291.0)
Iron: 72 ug/dL (ref 42–145)
Saturation Ratios: 23.6 % (ref 20.0–50.0)
TIBC: 305.2 ug/dL (ref 250.0–450.0)
Transferrin: 218 mg/dL (ref 212.0–360.0)

## 2023-09-14 LAB — TSH: TSH: 2.28 u[IU]/mL (ref 0.35–5.50)

## 2023-09-14 LAB — VITAMIN B12: Vitamin B-12: 265 pg/mL (ref 211–911)

## 2023-09-14 LAB — HEPATIC FUNCTION PANEL
ALT: 9 U/L (ref 0–35)
AST: 12 U/L (ref 0–37)
Albumin: 3.9 g/dL (ref 3.5–5.2)
Alkaline Phosphatase: 64 U/L (ref 39–117)
Bilirubin, Direct: 0.1 mg/dL (ref 0.0–0.3)
Total Bilirubin: 0.4 mg/dL (ref 0.2–1.2)
Total Protein: 6.7 g/dL (ref 6.0–8.3)

## 2023-09-14 LAB — HEMOGLOBIN A1C: Hgb A1c MFr Bld: 5.7 % (ref 4.6–6.5)

## 2023-09-14 LAB — VITAMIN D 25 HYDROXY (VIT D DEFICIENCY, FRACTURES): VITD: 27.56 ng/mL — ABNORMAL LOW (ref 30.00–100.00)

## 2023-09-19 ENCOUNTER — Encounter: Payer: Self-pay | Admitting: Family Medicine

## 2023-09-19 NOTE — Progress Notes (Unsigned)
Jerryl Holzhauer T. Laraina Sulton, MD, CAQ Sports Medicine [facility] [address] [place], [address]  Phone: [phone]  FAX: [phone]  SAMIR LEYMAN - 77 y.o. female  MRN [id number]  Date of Birth: 12/19/1945  Date: 09/21/2023  PCP: Hannah Beat, MD  Referral: Hannah Beat, MD  No chief complaint on file.  Patient Care Team: Hannah Beat, MD as PCP - General Subjective:   CLEOFAS HAYNER is a 77 y.o. pleasant patient who presents with the following:  Health Maintenance Summary Reviewed and updated, unless pt declines services.  Tobacco History Reviewed. Non-smoker Alcohol: No concerns, no excessive use Exercise Habits: Some activity, rec at least 30 mins 5 times a week STD concerns: none Drug Use: None Lumps or breast concerns: no  Tdap Flu Covid booster  She does have chronic kidney disease stage III, sees nephrology, and has anemia of chronic disease.  HTN: Tolerating all medications without side effects Stable and at goal No CP, no sob. No HA.  BP Readings from Last 3 Encounters:  09/14/22 (!) 140/64  09/08/22 (!) 144/65  12/08/21 113/64    Basic Metabolic Panel:    Component Value Date/Time   NA 135 09/14/2023 0842   K 4.9 09/14/2023 0842   CL 102 09/14/2023 0842   CO2 26 09/14/2023 0842   BUN 21 09/14/2023 0842   CREATININE 1.84 (H) 09/14/2023 0842   GLUCOSE 125 (H) 09/14/2023 0842   CALCIUM 9.1 09/14/2023 0842     Health Maintenance  Topic Date Due   DTaP/Tdap/Td (1 - Tdap) Never done   COVID-19 Vaccine (4 - 2023-24 season) 07/09/2023   Medicare Annual Wellness (AWV)  05/02/2024   Colonoscopy  09/08/2025   Pneumonia Vaccine 42+ Years old  Completed   INFLUENZA VACCINE  Completed   DEXA SCAN  Completed   Hepatitis C Screening  Completed   Zoster Vaccines- Shingrix  Completed   HPV VACCINES  Aged Out    Immunization History  Administered Date(s) Administered   Fluad Quad(high Dose 65+)  09/07/2022   Influenza, High Dose Seasonal PF 08/23/2016, 08/24/2017, 08/16/2018, 08/11/2020, 08/26/2021   Influenza-Unspecified 07/08/2014, 08/08/2015, 08/08/2015   PFIZER(Purple Top)SARS-COV-2 Vaccination 01/01/2020, 01/22/2020   Pfizer Covid-19 Vaccine Bivalent Booster 45yrs & up 09/22/2021   Pneumococcal Conjugate-13 03/04/2015   Pneumococcal Polysaccharide-23 09/05/2012   Zoster Recombinant(Shingrix) 08/05/2019, 12/03/2019   Patient Active Problem List   Diagnosis Date Noted   CKD (chronic kidney disease) stage 3, GFR 30-59 ml/min (HCC) 11/29/2013    Priority: Medium    HYPERLIPIDEMIA 12/15/2008    Priority: Medium    Essential hypertension 12/15/2008    Priority: Medium    B12 deficiency 09/13/2021   Allergic rhinitis    Avascular necrosis of left femoral head (HCC) 12/16/2011   Anemia, iron deficiency 12/16/2011   Hx of total hip arthroplasty 12/15/2011   AVN (avascular necrosis of bone) Right Hip 10/04/2011   Atherosclerosis of native artery of extremity with intermittent claudication (HCC) 06/17/2009   Lumbar back pain with radiculopathy affecting left lower extremity 06/03/2009   Open-angle glaucoma 10/27/2008    Past Medical History:  Diagnosis Date   Allergic rhinitis    Anemia in chronic kidney disease (CKD)    B12 deficiency 09/13/2021   Blood transfusion 2012   and 2013   Cataract    bilateral sx   Chronic low back pain    with radiculopathy   CKD (chronic kidney disease) stage 3, GFR 30-59 ml/min (  HCC) 11/29/2013   HLD (hyperlipidemia)    HTN (hypertension)    Hx of total hip arthroplasty 12/15/2011   Open-angle glaucoma    PAD (peripheral artery disease) (HCC)    with intermittent claudication, ABI 0.72 on Rt and 0.73 on L when assessed in 2010.     Past Surgical History:  Procedure Laterality Date   abnormal vascular studies  05/07/2009   ABI's 70, R SFA > 50%^ blockage    CATARACT EXTRACTION, BILATERAL  2011   EXCISION OF TONGUE LESION WITH  LASER N/A 08/31/2015   Procedure: BIOPSY AND CO2 LASER OF TONGUE LESION ;  Surgeon: Serena Colonel, MD;  Location: [facility];  Service: ENT;  Laterality: N/A;   EXCISION OF TONGUE LESION WITH LASER Left 06/29/2020   Procedure: EXCISION OF TONGUE LESION/CO2 laser w/biopsy;  Surgeon: Serena Colonel, MD;  Location: [facility];  Service: ENT;  Laterality: Left;   EXCISION ORAL LESION WITH CO2 LASER N/A 12/08/2021   Procedure: EXCISION OF ORAL ORAL CAVITY LESION WITH CO2 LASER;  Surgeon: Serena Colonel, MD;  Location: [facility];  Service: ENT;  Laterality: N/A;   OTHER SURGICAL HISTORY     ganglion cyst removed left wrist    TOTAL HIP ARTHROPLASTY  10/03/2011   Procedure: TOTAL HIP ARTHROPLASTY;  Surgeon: Loanne Drilling;  Location: [facility];  Service: Orthopedics;  Laterality: Right;   TOTAL HIP ARTHROPLASTY  12/21/2011   Procedure: TOTAL HIP ARTHROPLASTY;  Surgeon: Loanne Drilling, MD;  Location: [facility];  Service: Orthopedics;  Laterality: Left;   TUBAL LIGATION      Family History  Problem Relation Age of Onset   Colon polyps Mother 25   Heart attack Father        63s   Stroke Neg Hx        also no PAD or aneurysm   Colon cancer Neg Hx    Esophageal cancer Neg Hx    Stomach cancer Neg Hx    Rectal cancer Neg Hx     Social History   Social History Narrative   Retired Haematologist, 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     Past Medical History, Surgical History, Social History, Family History, Problem List, Medications, and Allergies have been reviewed and updated if relevant.  Review of Systems: Pertinent positives are listed above.  Otherwise, a full 14 point review of systems has been done in full and it is negative except where it is noted positive.  Objective:   There were no vitals taken for this visit. Ideal Body Weight:   No results found.    05/03/2023   11:56 AM 02/23/2022   10:12 AM 09/13/2021    2:40 PM 08/31/2020    2:35 PM 05/09/2019     2:22 PM  Depression screen PHQ 2/9  Decreased Interest 0 0 0 0 0  Down, Depressed, Hopeless 0 0  0 0  PHQ - 2 Score 0 0 0 0 0     GEN: well developed, well nourished, no acute distress Eyes: conjunctiva and lids normal, PERRLA, EOMI ENT: TM clear, nares clear, oral exam WNL Neck: supple, no lymphadenopathy, no thyromegaly, no JVD Pulm: clear to auscultation and percussion, respiratory effort normal CV: regular rate and rhythm, S1-S2, no murmur, rub or gallop, no bruits Chest: no scars, masses, no lumps BREAST: breast exam declined GI: soft, non-tender; no hepatosplenomegaly, masses; active bowel sounds all quadrants GU: GU exam declined Lymph: no cervical, axillary  or inguinal adenopathy MSK: gait normal, muscle tone and strength WNL, no joint swelling, effusions, discoloration, crepitus  SKIN: clear, good turgor, color WNL, no rashes, lesions, or ulcerations Neuro: normal mental status, normal strength, sensation, and motion Psych: alert; oriented to person, place and time, normally interactive and not anxious or depressed in appearance.   All labs reviewed with patient. Results for orders placed or performed in visit on 09/14/23  IBC + Ferritin  Result Value Ref Range   Iron 72 42 - 145 ug/dL   Transferrin 161.0 960.4 - 360.0 mg/dL   Saturation Ratios 54.0 20.0 - 50.0 %   Ferritin 358.1 (H) 10.0 - 291.0 ng/mL   TIBC 305.2 250.0 - 450.0 mcg/dL  Vitamin J81  Result Value Ref Range   Vitamin B-12 265 211 - 911 pg/mL  Hepatic Function Panel  Result Value Ref Range   Total Bilirubin 0.4 0.2 - 1.2 mg/dL   Bilirubin, Direct 0.1 0.0 - 0.3 mg/dL   Alkaline Phosphatase 64 39 - 117 U/L   AST 12 0 - 37 U/L   ALT 9 0 - 35 U/L   Total Protein 6.7 6.0 - 8.3 g/dL   Albumin 3.9 3.5 - 5.2 g/dL  Basic metabolic panel  Result Value Ref Range   Sodium 135 135 - 145 mEq/L   Potassium 4.9 3.5 - 5.1 mEq/L   Chloride 102 96 - 112 mEq/L   CO2 26 19 - 32 mEq/L   Glucose, Bld 125 (H) 70  - 99 mg/dL   BUN 21 6 - 23 mg/dL   Creatinine, Ser 1.91 (H) 0.40 - 1.20 mg/dL   GFR 47.82 (L) >95.62 mL/min   Calcium 9.1 8.4 - 10.5 mg/dL  VITAMIN D 25 Hydroxy (Vit-D Deficiency, Fractures)  Result Value Ref Range   VITD 27.56 (L) 30.00 - 100.00 ng/mL  TSH  Result Value Ref Range   TSH 2.28 0.35 - 5.50 uIU/mL  CBC with Differential/Platelet  Result Value Ref Range   WBC 8.3 4.0 - 10.5 K/uL   RBC 3.18 (L) 3.87 - 5.11 Mil/uL   Hemoglobin 10.1 (L) 12.0 - 15.0 g/dL   HCT 13.0 (L) 86.5 - 78.4 %   MCV 96.5 78.0 - 100.0 fl   MCHC 33.1 30.0 - 36.0 g/dL   RDW 69.6 29.5 - 28.4 %   Platelets 324.0 150.0 - 400.0 K/uL   Neutrophils Relative % 60.9 43.0 - 77.0 %   Lymphocytes Relative 24.8 12.0 - 46.0 %   Monocytes Relative 8.7 3.0 - 12.0 %   Eosinophils Relative 4.6 0.0 - 5.0 %   Basophils Relative 1.0 0.0 - 3.0 %   Neutro Abs 5.1 1.4 - 7.7 K/uL   Lymphs Abs 2.1 0.7 - 4.0 K/uL   Monocytes Absolute 0.7 0.1 - 1.0 K/uL   Eosinophils Absolute 0.4 0.0 - 0.7 K/uL   Basophils Absolute 0.1 0.0 - 0.1 K/uL  Lipid panel  Result Value Ref Range   Cholesterol 204 (H) 0 - 200 mg/dL   Triglycerides 132.4 0.0 - 149.0 mg/dL   HDL 40.10 >27.25 mg/dL   VLDL 36.6 0.0 - 44.0 mg/dL   LDL Cholesterol 347 (H) 0 - 99 mg/dL   Total CHOL/HDL Ratio 4    NonHDL 145.81   Hemoglobin A1c  Result Value Ref Range   Hgb A1c MFr Bld 5.7 4.6 - 6.5 %   No results found.  Assessment and Plan:     ICD-10-CM   1. Healthcare maintenance  Z00.00  Health Maintenance Exam: The patient's preventative maintenance and recommended screening tests for an annual wellness exam were reviewed in full today. Brought up to date unless services declined.  Counselled on the importance of diet, exercise, and its role in overall health and mortality. The patient's FH and SH was reviewed, including their home life, tobacco status, and drug and alcohol status.  Follow-up in 1 year for physical exam or additional follow-up  below.  Disposition: No follow-ups on file.  Future Appointments  Date Time Provider Department Center  09/21/2023 10:20 AM Amaura Authier, Karleen Hampshire, MD [facility]  05/06/2024 11:15 AM [facility] ANNUAL WELLNESS VISIT 1 [facility]    No orders of the defined types were placed in this encounter.  There are no discontinued medications. No orders of the defined types were placed in this encounter.   Signed,  Elpidio Galea. Daniil Labarge, MD   Allergies as of 09/21/2023       Reactions   Prednisone Other (See Comments)   Joint pain,joint stiffness   Statins Rash   "hands broke out"        Medication List        Accurate as of September 19, 2023  1:22 PM. If you have any questions, ask your nurse or doctor.          amLODipine 10 MG tablet Commonly known as: NORVASC TAKE 1 TABLET BY MOUTH DAILY   aspirin 81 MG tablet Take 81 mg by mouth daily.   CALCIUM 600 PO Take 900 mg by mouth daily.   Cholecalciferol 50 MCG (2000 UT) Caps Take 1 capsule by mouth daily.   lisinopril 20 MG tablet Commonly known as: ZESTRIL TAKE 1 TABLET BY MOUTH DAILY   metoprolol 200 MG 24 hr tablet Commonly known as: TOPROL-XL TAKE 1 TABLET BY MOUTH DAILY   PRESERVISION AREDS PO Take 1 capsule by mouth daily at 6 (six) AM.   traMADol 50 MG tablet Commonly known as: ULTRAM Take 1 tablet (50 mg total) by mouth every 6 (six) hours as needed.

## 2023-09-21 ENCOUNTER — Encounter: Payer: Self-pay | Admitting: Family Medicine

## 2023-09-21 ENCOUNTER — Ambulatory Visit (INDEPENDENT_AMBULATORY_CARE_PROVIDER_SITE_OTHER): Payer: Medicare Other | Admitting: Family Medicine

## 2023-09-21 VITALS — BP 135/60 | HR 67 | Temp 97.8°F | Ht 60.5 in | Wt 153.1 lb

## 2023-09-21 DIAGNOSIS — Z0000 Encounter for general adult medical examination without abnormal findings: Secondary | ICD-10-CM

## 2023-09-21 MED ORDER — TRAMADOL HCL 50 MG PO TABS: 50.0000 mg | ORAL_TABLET | Freq: Two times a day (BID) | ORAL | 3 refills | Status: DC | PRN

## 2023-09-22 ENCOUNTER — Other Ambulatory Visit: Payer: Self-pay

## 2023-09-22 ENCOUNTER — Encounter (HOSPITAL_BASED_OUTPATIENT_CLINIC_OR_DEPARTMENT_OTHER): Payer: Self-pay | Admitting: Otolaryngology

## 2023-09-26 ENCOUNTER — Encounter (HOSPITAL_BASED_OUTPATIENT_CLINIC_OR_DEPARTMENT_OTHER)
Admission: RE | Admit: 2023-09-26 | Discharge: 2023-09-26 | Disposition: A | Discharge status: Home / Self Care | Payer: Medicare Other | Source: Ambulatory Visit | Attending: Otolaryngology

## 2023-09-26 DIAGNOSIS — Z01812 Encounter for preprocedural laboratory examination: Secondary | ICD-10-CM | POA: Insufficient documentation

## 2023-09-27 NOTE — H&P (Signed)
HPI:   Taylor Bush is a 77 y.o. female who presents as a return Patient.   Referring Provider: No ref. provider found  Chief complaint: Oral lesion.  HPI: She did well for a couple of years but then about a month ago started noticing the white spots on the posterior lateral oral tongue on the left. No other symptoms, no pain, no other discomfort. No other new medical problems.  PMH/Meds/All/SocHx/FamHx/ROS:   Past Medical History:  Diagnosis Date  Hypertension  Oral leukoplakia   Past Surgical History:  Procedure Laterality Date  HIP SURGERY  Procedure: HIP SURGERY   No family history of bleeding disorders, wound healing problems or difficulty with anesthesia.     Current Outpatient Medications:  acetaminophen-codeine (TYLENOL #3) 300-30 mg per tablet, TAKE 1 TABLET BY MOUTH EVERY 6 HOURS AS NEEDED FOR MODERATE PAIN., Disp: , Rfl:  amLODIPine (NORVASC) 10 mg tablet, TAKE 1 TABLET (10 MG TOTAL) BY MOUTH DAILY BEFORE BREAKFAST., Disp: , Rfl:  calcium citrate (CALCITRATE) 950 mg (200 mg calcium) tab, Take 950 mg by mouth., Disp: , Rfl:  cholecalciferol (VITAMIN D3) 2,000 unit cap capsule, Take by mouth., Disp: , Rfl:  cyanocobalamin (VITAMIN B12) 1,000 mcg tablet, Take 1,000 mcg by mouth., Disp: , Rfl:  lisinopriL (PRINIVIL) 20 mg tablet, , Disp: , Rfl:  metoprolol succinate (TOPROL XL) 200 mg 24 hr tablet, TAKE 1 TABLET (200 MG TOTAL) BY MOUTH DAILY., Disp: , Rfl:  sodium bicarbonate 650 mg tablet, TAKE 1 TABLET (650 MG TOTAL) BY MOUTH 2 (TWO) TIMES A DAY, Disp: , Rfl:  traMADoL (ULTRAM) 50 mg tablet, Take 50 mg by mouth., Disp: , Rfl:   A complete ROS was performed with pertinent positives/negatives noted in the HPI. The remainder of the ROS are negative.   Physical Exam:   Temp 96.9 F (36.1 C) (Temporal)  Resp 18  Ht 1.524 m (5')  Wt 71.2 kg (157 lb)  BMI 30.66 kg/m   General: Healthy and alert, in no distress, breathing easily. Normal affect. In a pleasant  mood. Head: Normocephalic, atraumatic. No masses, or scars. Eyes: Pupils are equal, and reactive to light. Vision is grossly intact. No spontaneous or gaze nystagmus. Ears: Ear canals are clear. Tympanic membranes are intact, with normal landmarks and the middle ears are clear and healthy. Hearing: Grossly normal. Nose: Nasal cavities are clear with healthy mucosa, no polyps or exudate. Airways are patent. Face: No masses or scars, facial nerve function is symmetric. Oral Cavity: Diffuse patchy leukoplakia left posterolateral oral tongue. No other abnormalities identified. Tongue with normal mobility. Dentition appears healthy. Oropharynx: Tonsils are symmetric. There are no mucosal masses identified. Tongue base appears normal and healthy. Larynx/Hypopharynx: deferred Chest: Deferred Neck: No palpable masses, no cervical adenopathy, no thyroid nodules or enlargement. Neuro: Cranial nerves II-XII with normal function. Balance: Normal gate. Other findings: none.  Independent Review of Additional Tests or Records:  none  Procedures:  none  Impression & Plans:  Recurrent oral leukoplakia. Recommend biopsy and laser ablation under anesthesia as we have done before. We will schedule at her convenience.

## 2023-09-29 NOTE — Progress Notes (Signed)
Anesthesia Chart Review: SAME DAY WORK-UP  Case: [id number] Date/Time: 10/02/23 1115   Procedure: ORAL BIOPSY AND CO2 LASER ABLATATION OF TONGUE LESION (Bilateral)   Anesthesia type: General   Pre-op diagnosis: Oral leukoplakia   [facility]OM 08 / [facility]   Surgeons: Serena Colonel, MD       DISCUSSION: Patient is a 78 year old female scheduled for the above procedure.  History includes former smoker (quit 11/07/08), HTN, HLD, PAD, CKD, anemia, open angle glaucoma, allergic rhinitis, osteoarthritis (right THA 10/03/11, left THA 12/21/11), oral leukoplakia.  Labs on 09/24/23 showed glucose 125, creatinine 1.84, EGFR 26.21, hemoglobin 10.1, hematocrit 30.7, platelet count 324.  Labs were consistent with prior results.  Anesthesia team to evaluate on the day of surgery.  VS: Ht 5' 0.5" (1.537 m)   Wt 68 kg   BMI 28.81 kg/m  BP Readings from Last 3 Encounters:  09/21/23 135/60  09/14/22 (!) 140/64  09/08/22 (!) 144/65   Pulse Readings from Last 3 Encounters:  09/21/23 67  09/14/22 64  09/08/22 (!) 57     PROVIDERS: Hannah Beat, MD is PCP  Marshia Ly, MD is nephrologist   LABS: Most recent labs in [facility] include: Lab Results  Component Value Date   WBC 8.3 09/14/2023   HGB 10.1 (L) 09/14/2023   HCT 30.7 (L) 09/14/2023   PLT 324.0 09/14/2023   GLUCOSE 125 (H) 09/14/2023   ALT 9 09/14/2023   AST 12 09/14/2023   NA 135 09/14/2023   K 4.9 09/14/2023   CL 102 09/14/2023   CREATININE 1.84 (H) 09/14/2023   BUN 21 09/14/2023   CO2 26 09/14/2023   TSH 2.28 09/14/2023   HGBA1C 5.7 09/14/2023    EKG: 09/26/23: NSR   CV: Nuclear stress test 09/13/2011: Impression: Normal stress nuclear study, EF 78%.   Past Medical History:  Diagnosis Date   Allergic rhinitis    Anemia in chronic kidney disease (CKD)    B12 deficiency 09/13/2021   Blood transfusion 2012   and 2013   Cataract    bilateral sx   Chronic low back pain    with radiculopathy   CKD (chronic  kidney disease) stage 3, GFR 30-59 ml/min (HCC) 11/29/2013   HLD (hyperlipidemia)    HTN (hypertension)    Hx of total hip arthroplasty 12/15/2011   Open-angle glaucoma    PAD (peripheral artery disease) (HCC)    with intermittent claudication, ABI 0.72 on Rt and 0.73 on L when assessed in 2010.     Past Surgical History:  Procedure Laterality Date   abnormal vascular studies  05/07/2009   ABI's 70, R SFA > 50%^ blockage    CATARACT EXTRACTION, BILATERAL  2011   EXCISION OF TONGUE LESION WITH LASER N/A 08/31/2015   Procedure: BIOPSY AND CO2 LASER OF TONGUE LESION ;  Surgeon: Serena Colonel, MD;  Location: [facility];  Service: ENT;  Laterality: N/A;   EXCISION OF TONGUE LESION WITH LASER Left 06/29/2020   Procedure: EXCISION OF TONGUE LESION/CO2 laser w/biopsy;  Surgeon: Serena Colonel, MD;  Location: [facility];  Service: ENT;  Laterality: Left;   EXCISION ORAL LESION WITH CO2 LASER N/A 12/08/2021   Procedure: EXCISION OF ORAL ORAL CAVITY LESION WITH CO2 LASER;  Surgeon: Serena Colonel, MD;  Location: [facility];  Service: ENT;  Laterality: N/A;   OTHER SURGICAL HISTORY     ganglion cyst removed left wrist    TOTAL HIP ARTHROPLASTY  10/03/2011  Procedure: TOTAL HIP ARTHROPLASTY;  Surgeon: Loanne Drilling;  [facility];  Service: Orthopedics;  Laterality: Right;   TOTAL HIP ARTHROPLASTY  12/21/2011   Procedure: TOTAL HIP ARTHROPLASTY;  Surgeon: Loanne Drilling, MD;  Location: [facility];  Service: Orthopedics;  Laterality: Left;   TUBAL LIGATION      MEDICATIONS: No current facility-administered medications for this encounter.    amLODipine (NORVASC) 10 MG tablet   carboxymethylcellulose (REFRESH PLUS) 0.5 % SOLN   Faricimab-svoa (VABYSMO IZ)   lisinopril (ZESTRIL) 20 MG tablet   metoprolol (TOPROL-XL) 200 MG 24 hr tablet   Multiple Vitamins-Minerals (PRESERVISION AREDS PO)   traMADol (ULTRAM) 50 MG tablet   aspirin 81 MG tablet     Shonna Chock, PA-C Surgical Short Stay/Anesthesiology [facility] Phone [phone] [facility] Phone [phone] 09/29/2023 4:59 PM

## 2023-09-29 NOTE — Progress Notes (Signed)
Surgical Instructions   Your procedure is scheduled on Monday, November 25th, 2024. Report to [facility] Main Entrance "A" at 9:00 A.M., then check in with the Admitting office. Any questions or running late day of surgery: call [phone]  Questions prior to your surgery date: call [phone], Monday-Friday, 8am-4pm. If you experience any cold or flu symptoms such as cough, fever, chills, shortness of breath, etc. between now and your scheduled surgery, please notify us at the above number.     Remember:  Do not eat or drink after midnight the night before your surgery     Take these medicines the morning of surgery with A SIP OF WATER: Amlodipine (Norvasc) Metoprolol (Toprol-XL)   May take these medicines IF NEEDED: Tramadol (Ultram)    One week prior to surgery, STOP taking any Aspirin (unless otherwise instructed by your surgeon) Aleve, Naproxen, Ibuprofen, Motrin, Advil, Goody's, BC's, all herbal medications, fish oil, and non-prescription vitamins.                     Do NOT Smoke (Tobacco/Vaping) for 24 hours prior to your procedure.  If you use a CPAP at night, you may bring your mask/headgear for your overnight stay.   You will be asked to remove any contacts, glasses, piercing's, hearing aid's, dentures/partials prior to surgery. Please bring cases for these items if needed.    Patients discharged the day of surgery will not be allowed to drive home, and someone needs to stay with them for 24 hours.  SURGICAL WAITING ROOM VISITATION Patients may have no more than 2 support people in the waiting area - these visitors may rotate.   Pre-op nurse will coordinate an appropriate time for 1 ADULT support person, who may not rotate, to accompany patient in pre-op.  Children under the age of 38 must have an adult with them who is not the patient and must remain in the main waiting area with an adult.  If the patient needs to stay at the hospital during part of their  recovery, the visitor guidelines for inpatient rooms apply.  Please refer to the [facility] website for the visitor guidelines for any additional information.    Additional instructions for the day of surgery: DO NOT APPLY any lotions, deodorants, cologne, or perfumes.   Do not wear jewelry or makeup Do not wear nail polish, gel polish, artificial nails, or any other type of covering on natural nails (fingers and toes) Do not bring valuables to the hospital. [facility] is not responsible for valuables/personal belongings. Put on clean/comfortable clothes.  Please brush your teeth.  Ask your nurse before applying any prescription medications to the skin.

## 2023-09-29 NOTE — Anesthesia Preprocedure Evaluation (Signed)
Anesthesia Evaluation  Patient identified by MRN, date of birth, ID band Patient awake    Reviewed: Allergy & Precautions, H&P , NPO status , Patient's Chart, lab work & pertinent test results  Airway Mallampati: II  TM Distance: >3 FB Neck ROM: Full    Dental no notable dental hx. (+) Dental Advisory Given   Pulmonary former smoker   Pulmonary exam normal breath sounds clear to auscultation       Cardiovascular hypertension, + Peripheral Vascular Disease  Normal cardiovascular exam Rhythm:Regular Rate:Normal     Neuro/Psych LE radiculopathy   negative psych ROS   GI/Hepatic negative GI ROS, Neg liver ROS,,,  Endo/Other  negative endocrine ROS    Renal/GU CRFRenal disease  negative genitourinary   Musculoskeletal negative musculoskeletal ROS (+)    Abdominal   Peds negative pediatric ROS (+)  Hematology  (+) Blood dyscrasia, anemia   Anesthesia Other Findings Oral leukopakia  Reproductive/Obstetrics negative OB ROS                             Anesthesia Physical Anesthesia Plan  ASA: 3  Anesthesia Plan: General   Post-op Pain Management:    Induction: Intravenous  PONV Risk Score and Plan: 0 and 2 and Ondansetron, Dexamethasone and Treatment may vary due to age or medical condition  Airway Management Planned: Oral ETT  Additional Equipment:   Intra-op Plan:   Post-operative Plan: Extubation in OR  Informed Consent: I have reviewed the patients History and Physical, chart, labs and discussed the procedure including the risks, benefits and alternatives for the proposed anesthesia with the patient or authorized representative who has indicated his/her understanding and acceptance.     Dental advisory given  Plan Discussed with: CRNA  Anesthesia Plan Comments: (PAT note written 09/29/2023 by Shonna Chock, PA-C.  Patient is a 77 year old female scheduled for the above  procedure.   History includes former smoker (quit 11/07/08), HTN, HLD, PAD, CKD, anemia, open angle glaucoma, allergic rhinitis, osteoarthritis (right THA 10/03/11, left THA 12/21/11), oral leukoplakia. )       Anesthesia Quick Evaluation

## 2023-09-29 NOTE — Progress Notes (Signed)
Patient was originally scheduled for [facility] but was moved to [facility] due to scheduling availability.  Patient reports no changes to medical history since she received instructions from [facility].  Patient given new arrival time and location.

## 2023-10-02 ENCOUNTER — Ambulatory Visit (HOSPITAL_COMMUNITY): Payer: Medicare Other | Admitting: Vascular Surgery

## 2023-10-02 ENCOUNTER — Other Ambulatory Visit: Payer: Self-pay

## 2023-10-02 ENCOUNTER — Ambulatory Visit (HOSPITAL_BASED_OUTPATIENT_CLINIC_OR_DEPARTMENT_OTHER): Payer: Medicare Other | Admitting: Vascular Surgery

## 2023-10-02 ENCOUNTER — Encounter (HOSPITAL_COMMUNITY): Payer: Self-pay | Admitting: Otolaryngology

## 2023-10-02 ENCOUNTER — Encounter (HOSPITAL_COMMUNITY)
Admission: RE | Disposition: A | Discharge status: Home / Self Care | Payer: Self-pay | Source: Home / Self Care | Attending: Otolaryngology

## 2023-10-02 ENCOUNTER — Ambulatory Visit (HOSPITAL_COMMUNITY)
Admission: RE | Admit: 2023-10-02 | Discharge: 2023-10-02 | Disposition: A | Discharge status: Home / Self Care | Payer: Medicare Other | Attending: Otolaryngology | Admitting: Otolaryngology

## 2023-10-02 DIAGNOSIS — K148 Other diseases of tongue: Secondary | ICD-10-CM | POA: Diagnosis not present

## 2023-10-02 DIAGNOSIS — Z87891 Personal history of nicotine dependence: Secondary | ICD-10-CM | POA: Diagnosis not present

## 2023-10-02 DIAGNOSIS — K1321 Leukoplakia of oral mucosa, including tongue: Secondary | ICD-10-CM | POA: Diagnosis not present

## 2023-10-02 DIAGNOSIS — K1230 Oral mucositis (ulcerative), unspecified: Secondary | ICD-10-CM | POA: Diagnosis not present

## 2023-10-02 DIAGNOSIS — L859 Epidermal thickening, unspecified: Secondary | ICD-10-CM | POA: Diagnosis not present

## 2023-10-02 DIAGNOSIS — I129 Hypertensive chronic kidney disease with stage 1 through stage 4 chronic kidney disease, or unspecified chronic kidney disease: Secondary | ICD-10-CM | POA: Diagnosis not present

## 2023-10-02 DIAGNOSIS — N189 Chronic kidney disease, unspecified: Secondary | ICD-10-CM | POA: Diagnosis not present

## 2023-10-02 DIAGNOSIS — I739 Peripheral vascular disease, unspecified: Secondary | ICD-10-CM | POA: Insufficient documentation

## 2023-10-02 DIAGNOSIS — N183 Chronic kidney disease, stage 3 unspecified: Secondary | ICD-10-CM | POA: Diagnosis not present

## 2023-10-02 DIAGNOSIS — D631 Anemia in chronic kidney disease: Secondary | ICD-10-CM | POA: Diagnosis not present

## 2023-10-02 DIAGNOSIS — I10 Essential (primary) hypertension: Secondary | ICD-10-CM | POA: Diagnosis not present

## 2023-10-02 HISTORY — PX: EXCISION OF TONGUE LESION WITH LASER: SHX5823

## 2023-10-02 SURGERY — EXCISION, LESION, TONGUE, USING LASER
Anesthesia: General | Laterality: Bilateral

## 2023-10-02 MED ORDER — HYDROCODONE-ACETAMINOPHEN 7.5-325 MG PO TABS: 1.0000 | ORAL_TABLET | Freq: Four times a day (QID) | ORAL | 0 refills | Status: DC | PRN

## 2023-10-02 MED ORDER — LIDOCAINE-EPINEPHRINE 1 %-1:100000 IJ SOLN
INTRAMUSCULAR | Status: DC | PRN
  Administered 2023-10-02: .5 mL

## 2023-10-02 MED ORDER — PROPOFOL 10 MG/ML IV BOLUS
INTRAVENOUS | Status: DC | PRN
  Administered 2023-10-02: 110 mg via INTRAVENOUS

## 2023-10-02 MED ORDER — PROPOFOL 10 MG/ML IV BOLUS
INTRAVENOUS | Status: AC
  Filled 2023-10-02: qty 20

## 2023-10-02 MED ORDER — LIDOCAINE 2% (20 MG/ML) 5 ML SYRINGE
INTRAMUSCULAR | Status: DC | PRN
  Administered 2023-10-02: 100 mg via INTRAVENOUS

## 2023-10-02 MED ORDER — ONDANSETRON HCL 4 MG/2ML IJ SOLN
INTRAMUSCULAR | Status: DC | PRN
  Administered 2023-10-02: 4 mg via INTRAVENOUS

## 2023-10-02 MED ORDER — CHLORHEXIDINE GLUCONATE 0.12 % MT SOLN
15.0000 mL | Freq: Once | OROMUCOSAL | Status: AC
  Administered 2023-10-02: 15 mL via OROMUCOSAL
  Filled 2023-10-02: qty 15

## 2023-10-02 MED ORDER — ROCURONIUM BROMIDE 10 MG/ML (PF) SYRINGE
PREFILLED_SYRINGE | INTRAVENOUS | Status: DC | PRN
  Administered 2023-10-02: 60 mg via INTRAVENOUS

## 2023-10-02 MED ORDER — SUGAMMADEX SODIUM 200 MG/2ML IV SOLN
INTRAVENOUS | Status: DC | PRN
  Administered 2023-10-02: 200 mg via INTRAVENOUS

## 2023-10-02 MED ORDER — ORAL CARE MOUTH RINSE: 15.0000 mL | Freq: Once | OROMUCOSAL | Status: AC

## 2023-10-02 MED ORDER — LACTATED RINGERS IV SOLN: INTRAVENOUS | Status: DC

## 2023-10-02 MED ORDER — FENTANYL CITRATE (PF) 250 MCG/5ML IJ SOLN
INTRAMUSCULAR | Status: DC | PRN
  Administered 2023-10-02: 100 ug via INTRAVENOUS

## 2023-10-02 MED ORDER — LIDOCAINE-EPINEPHRINE 1 %-1:100000 IJ SOLN
INTRAMUSCULAR | Status: AC
  Filled 2023-10-02: qty 1

## 2023-10-02 MED ORDER — DEXAMETHASONE SODIUM PHOSPHATE 10 MG/ML IJ SOLN
INTRAMUSCULAR | Status: DC | PRN
  Administered 2023-10-02: 4 mg via INTRAVENOUS

## 2023-10-02 MED ORDER — EPHEDRINE SULFATE-NACL 50-0.9 MG/10ML-% IV SOSY
PREFILLED_SYRINGE | INTRAVENOUS | Status: DC | PRN
  Administered 2023-10-02: 10 mg via INTRAVENOUS

## 2023-10-02 MED ORDER — LACTATED RINGERS IV SOLN
INTRAVENOUS | Status: DC
Start: 2023-10-02 — End: 2023-10-02

## 2023-10-02 MED ORDER — FENTANYL CITRATE (PF) 250 MCG/5ML IJ SOLN
INTRAMUSCULAR | Status: AC
  Filled 2023-10-02: qty 5

## 2023-10-02 MED ORDER — EPINEPHRINE HCL (NASAL) 0.1 % NA SOLN
NASAL | Status: AC
  Filled 2023-10-02: qty 30

## 2023-10-02 SURGICAL SUPPLY — 22 items
BAG COUNTER SPONGE SURGICOUNT (BAG) ×1 IMPLANT
BLADE SURG 15 STRL LF DISP TIS (BLADE) IMPLANT
CANISTER SUCT 3000ML PPV (MISCELLANEOUS) ×1 IMPLANT
COVER BACK TABLE 60X90IN (DRAPES) ×1 IMPLANT
DEPRESSOR TONGUE 6 IN STERILE (GAUZE/BANDAGES/DRESSINGS) ×1 IMPLANT
DRAPE HALF SHEET 40X57 (DRAPES) ×1 IMPLANT
GAUZE SPONGE 4X4 12PLY STRL (GAUZE/BANDAGES/DRESSINGS) IMPLANT
GLOVE ECLIPSE 7.5 STRL STRAW (GLOVE) ×1 IMPLANT
GUARD TEETH (MISCELLANEOUS) IMPLANT
KIT BASIN OR (CUSTOM PROCEDURE TRAY) ×1 IMPLANT
KIT TURNOVER KIT B (KITS) ×1 IMPLANT
NDL PRECISIONGLIDE 27X1.5 (NEEDLE) ×1 IMPLANT
NEEDLE PRECISIONGLIDE 27X1.5 (NEEDLE) ×1 IMPLANT
PAD ARMBOARD 7.5X6 YLW CONV (MISCELLANEOUS) ×2 IMPLANT
PUNCH BIOPSY DERMAL 6MM STRL (MISCELLANEOUS) IMPLANT
SUT CHROMIC 4 0 P 3 18 (SUTURE) IMPLANT
SYR CONTROL 10ML LL (SYRINGE) ×1 IMPLANT
SYR TB 1ML LUER SLIP (SYRINGE) IMPLANT
TOWEL GREEN STERILE FF (TOWEL DISPOSABLE) ×1 IMPLANT
TUBE CONNECTING 12X1/4 (SUCTIONS) ×1 IMPLANT
WATER STERILE IRR 1000ML POUR (IV SOLUTION) ×1 IMPLANT
YANKAUER SUCT BULB TIP NO VENT (SUCTIONS) ×1 IMPLANT

## 2023-10-02 NOTE — Transfer of Care (Signed)
Immediate Anesthesia Transfer of Care Note  Patient: Taylor Bush  Procedure(s) Performed: ORAL BIOPSY AND CO2 LASER ABLATATION OF TONGUE LESION (Bilateral)  Patient Location: PACU  Anesthesia Type:General  Level of Consciousness: awake, alert , and oriented  Airway & Oxygen Therapy: Patient Spontanous Breathing  Post-op Assessment: Report given to RN and Post -op Vital signs reviewed and stable  Post vital signs: Reviewed and stable  Last Vitals:  Vitals Value Taken Time  BP 156/67 10/02/23 1243  Temp 36.5 C 10/02/23 1243  Pulse 65 10/02/23 1245  Resp 30 10/02/23 1245  SpO2 93 % 10/02/23 1245  Vitals shown include unfiled device data.  Last Pain:  Vitals:   10/02/23 0923  TempSrc:   PainSc: 0-No pain      Patients Stated Pain Goal: 0 (10/02/23 0923)  Complications: No notable events documented.

## 2023-10-02 NOTE — Op Note (Signed)
OPERATIVE REPORT  DATE OF SURGERY: 10/02/2023  PATIENT:  Taylor Bush,  77 y.o. female  PRE-OPERATIVE DIAGNOSIS:  Oral leukoplakia  POST-OPERATIVE DIAGNOSIS:  Oral leukoplakia  PROCEDURE:  Procedure(s): ORAL BIOPSY AND CO2 LASER ABLATATION OF TONGUE LESION, left  SURGEON:  Susy Frizzle, MD  ASSISTANTS: None  ANESTHESIA:   General   EBL: Less than 5 ml  DRAINS: None  LOCAL MEDICATIONS USED: 1% Xylocaine with epinephrine  SPECIMEN: Left tongue biopsy  COUNTS:  Correct  PROCEDURE DETAILS: The patient was taken to the operating room and placed on the operating table in the supine position. Following induction of general endotracheal anesthesia, the patient was prepped and draped in standard fashion.  A towel clip was used to protrude the tongue grasping with the tip at the midline.  Wet towels were placed around the mouth for laser protection.  A movable bite guard was used throughout the case.  The lesion was identified in the left lateral oral tongue.  Measured approximately 3 x 2 cm and oval in shape.  Local anesthetic was infiltrated into the anterior aspect and a 6 mm punch biopsy was used for biopsy.  The defect was reapproximated with interrupted 4-0 chromic sutures x 2, inverted.  The carbon oxide laser was then used at a setting of 4 W continuous power to ablate the remaining leukoplakic lesion.  All abnormal mucosa was removed.  Eschar was cleaned off using suction and moist Ray-Tec.  Towel clip was removed.  The patient was then awakened extubated and transferred to recovery in stable condition.    PATIENT DISPOSITION:  To PACU, stable

## 2023-10-02 NOTE — Anesthesia Procedure Notes (Signed)
Procedure Name: Intubation Date/Time: 10/02/2023 12:06 PM  Performed by: Sandie Ano, CRNAPre-anesthesia Checklist: Patient identified, Emergency Drugs available, Suction available and Patient being monitored Patient Re-evaluated:Patient Re-evaluated prior to induction Oxygen Delivery Method: Circle System Utilized Preoxygenation: Pre-oxygenation with 100% oxygen Induction Type: IV induction Ventilation: Mask ventilation without difficulty Laryngoscope Size: Mac and 3 Grade View: Grade I Tube type: Oral Tube size: 7.0 mm Number of attempts: 1 Airway Equipment and Method: Stylet and Oral airway Placement Confirmation: ETT inserted through vocal cords under direct vision, positive ETCO2 and breath sounds checked- equal and bilateral Secured at: 21 cm Tube secured with: Tape Dental Injury: Teeth and Oropharynx as per pre-operative assessment

## 2023-10-02 NOTE — Anesthesia Postprocedure Evaluation (Signed)
Anesthesia Post Note  Patient: JOZEE CROLL  Procedure(s) Performed: ORAL BIOPSY AND CO2 LASER ABLATATION OF TONGUE LESION (Bilateral)     Patient location during evaluation: PACU Anesthesia Type: General Level of consciousness: awake and alert Pain management: pain level controlled Vital Signs Assessment: post-procedure vital signs reviewed and stable Respiratory status: spontaneous breathing, nonlabored ventilation, respiratory function stable and patient connected to nasal cannula oxygen Cardiovascular status: blood pressure returned to baseline and stable Postop Assessment: no apparent nausea or vomiting Anesthetic complications: no   No notable events documented.  Last Vitals:  Vitals:   10/02/23 1300 10/02/23 1315  BP: (!) 144/60 (!) 152/65  Pulse: 65 63  Resp: 14 (!) 22  Temp:  36.5 C  SpO2: 95% 94%    Last Pain:  Vitals:   10/02/23 1315  TempSrc:   PainSc: 0-No pain                 Mariann Barter

## 2023-10-02 NOTE — Discharge Instructions (Signed)
Resume diet as tolerated.  Okay to use Tylenol and/or Motrin.  Use the prescription pain medicine if needed.  Rinse mouth with salt water 3 times daily.

## 2023-10-02 NOTE — Interval H&P Note (Signed)
History and Physical Interval Note:  10/02/2023 10:16 AM  Taylor Bush  has presented today for surgery, with the diagnosis of Oral leukoplakia.  The various methods of treatment have been discussed with the patient and family. After consideration of risks, benefits and other options for treatment, the patient has consented to  Procedure(s): ORAL BIOPSY AND CO2 LASER ABLATATION OF TONGUE LESION (Bilateral) as a surgical intervention.  The patient's history has been reviewed, patient examined, no change in status, stable for surgery.  I have reviewed the patient's chart and labs.  Questions were answered to the patient's satisfaction.     Serena Colonel

## 2023-10-03 ENCOUNTER — Encounter (HOSPITAL_COMMUNITY): Payer: Self-pay | Admitting: Otolaryngology

## 2023-10-03 LAB — SURGICAL PATHOLOGY

## 2023-10-04 DIAGNOSIS — I10 Essential (primary) hypertension: Secondary | ICD-10-CM

## 2023-10-24 DIAGNOSIS — H353231 Exudative age-related macular degeneration, bilateral, with active choroidal neovascularization: Secondary | ICD-10-CM | POA: Diagnosis not present

## 2023-11-18 ENCOUNTER — Other Ambulatory Visit: Payer: Self-pay | Admitting: Family Medicine

## 2023-12-05 DIAGNOSIS — H35033 Hypertensive retinopathy, bilateral: Secondary | ICD-10-CM | POA: Diagnosis not present

## 2023-12-05 DIAGNOSIS — Z961 Presence of intraocular lens: Secondary | ICD-10-CM | POA: Diagnosis not present

## 2023-12-05 DIAGNOSIS — H353231 Exudative age-related macular degeneration, bilateral, with active choroidal neovascularization: Secondary | ICD-10-CM | POA: Diagnosis not present

## 2023-12-05 DIAGNOSIS — H43813 Vitreous degeneration, bilateral: Secondary | ICD-10-CM | POA: Diagnosis not present

## 2023-12-12 DIAGNOSIS — K1321 Leukoplakia of oral mucosa, including tongue: Secondary | ICD-10-CM | POA: Diagnosis not present

## 2024-01-04 ENCOUNTER — Telehealth: Payer: Self-pay | Admitting: Family Medicine

## 2024-01-04 DIAGNOSIS — N184 Chronic kidney disease, stage 4 (severe): Secondary | ICD-10-CM

## 2024-01-04 NOTE — Telephone Encounter (Signed)
 Copied from CRM [id number]. Topic: Referral - Question >> Jan 04, 2024  2:43 PM Shelbie Proctor wrote: Reason for CRM: Patient [phone] was seeing Dr. Thedore Mins in [place] but wants to see a different kidney specialist, can the referral be for Dr. Perrin Smack [phone]. Patient has Chronic kidney disease, stage IV (severe) (HCC). Patient wants a call back or via MyChart on this matter.

## 2024-01-05 ENCOUNTER — Encounter: Payer: Self-pay | Admitting: Family Medicine

## 2024-01-05 NOTE — Telephone Encounter (Signed)
 Sent mychart

## 2024-01-05 NOTE — Addendum Note (Signed)
 Addended by: Hannah Beat on: 01/05/2024 09:09 AM   Modules accepted: Orders

## 2024-01-16 DIAGNOSIS — H353231 Exudative age-related macular degeneration, bilateral, with active choroidal neovascularization: Secondary | ICD-10-CM | POA: Diagnosis not present

## 2024-01-18 NOTE — Telephone Encounter (Unsigned)
 Copied from CRM [id number]. Topic: Referral - Status >> Jan 18, 2024  1:43 PM Whitney O wrote: Reason for CRM: patient is calling about the referral order that dr copeland put in and he told her to call if she have not heard from anyone about the referral . Patient says no one has reached out and she wants to let the doctor know and could he please help her get scheduled [phone] call back number

## 2024-01-23 NOTE — Telephone Encounter (Signed)
 I spoke to [place] today. I confirmed she has the phone number for them and encouraged her to call them directly to ask the status of her referral- to obtain a timeline for scheduling. -Thanks

## 2024-02-15 ENCOUNTER — Other Ambulatory Visit: Payer: Self-pay | Admitting: Family Medicine

## 2024-02-15 MED ORDER — TRAMADOL HCL 50 MG PO TABS: 50.0000 mg | ORAL_TABLET | Freq: Two times a day (BID) | ORAL | 3 refills | Status: DC | PRN

## 2024-02-15 NOTE — Telephone Encounter (Signed)
 Last office visit 09/21/2023 for CPE.  Last refilled 09/21/2023 for #50 with 3 refills.  Next Appt: No future appointments with PCP.

## 2024-02-15 NOTE — Telephone Encounter (Signed)
 Copied from CRM [id number]. Topic: Clinical - Medication Refill >> Feb 15, 2024  9:21 AM Fredrich Romans wrote: Most Recent Primary Care Visit:  Provider: Hannah Beat  Department: [facility]  Visit Type: PHYSICAL  Date: 09/21/2023  Medication: traMADol (ULTRAM) 50 MG tablet  Has the patient contacted their pharmacy? No (Agent: If no, request that the patient contact the pharmacy for the refill. If patient does not wish to contact the pharmacy document the reason why and proceed with request.) (Agent: If yes, when and what did the pharmacy advise?)  Is this the correct pharmacy for this prescription? Yes If no, delete pharmacy and type the correct one.  This is the patient's preferred pharmacy:  [facility] [phone] [facility] [place] - [address] [place] [address] Phone: [phone] Fax: [phone]     Has the prescription been filled recently? Yes  Is the patient out of the medication? Yes  Has the patient been seen for an appointment in the last year OR does the patient have an upcoming appointment? Yes  Can we respond through MyChart? Yes  Agent: Please be advised that Rx refills may take up to 3 business days. We ask that you follow-up with your pharmacy.

## 2024-02-27 DIAGNOSIS — H353231 Exudative age-related macular degeneration, bilateral, with active choroidal neovascularization: Secondary | ICD-10-CM | POA: Diagnosis not present

## 2024-03-05 DIAGNOSIS — N184 Chronic kidney disease, stage 4 (severe): Secondary | ICD-10-CM | POA: Diagnosis not present

## 2024-03-05 DIAGNOSIS — D631 Anemia in chronic kidney disease: Secondary | ICD-10-CM | POA: Diagnosis not present

## 2024-03-05 DIAGNOSIS — R809 Proteinuria, unspecified: Secondary | ICD-10-CM | POA: Diagnosis not present

## 2024-03-05 DIAGNOSIS — I129 Hypertensive chronic kidney disease with stage 1 through stage 4 chronic kidney disease, or unspecified chronic kidney disease: Secondary | ICD-10-CM | POA: Diagnosis not present

## 2024-03-05 DIAGNOSIS — R0989 Other specified symptoms and signs involving the circulatory and respiratory systems: Secondary | ICD-10-CM | POA: Diagnosis not present

## 2024-03-05 DIAGNOSIS — N189 Chronic kidney disease, unspecified: Secondary | ICD-10-CM | POA: Diagnosis not present

## 2024-03-05 DIAGNOSIS — N2581 Secondary hyperparathyroidism of renal origin: Secondary | ICD-10-CM | POA: Diagnosis not present

## 2024-03-12 DIAGNOSIS — K1321 Leukoplakia of oral mucosa, including tongue: Secondary | ICD-10-CM | POA: Diagnosis not present

## 2024-03-15 ENCOUNTER — Encounter (HOSPITAL_COMMUNITY)

## 2024-03-18 ENCOUNTER — Other Ambulatory Visit (HOSPITAL_COMMUNITY): Payer: Self-pay

## 2024-03-19 ENCOUNTER — Ambulatory Visit (HOSPITAL_COMMUNITY)
Admission: RE | Admit: 2024-03-19 | Discharge: 2024-03-19 | Disposition: A | Discharge status: Home / Self Care | Source: Ambulatory Visit | Attending: Nephrology | Admitting: Nephrology

## 2024-03-19 DIAGNOSIS — D631 Anemia in chronic kidney disease: Secondary | ICD-10-CM | POA: Diagnosis not present

## 2024-03-19 DIAGNOSIS — N189 Chronic kidney disease, unspecified: Secondary | ICD-10-CM | POA: Diagnosis not present

## 2024-03-19 MED ORDER — SODIUM CHLORIDE 0.9 % IV SOLN
510.0000 mg | INTRAVENOUS | Status: DC
  Administered 2024-03-19: 510 mg via INTRAVENOUS
  Filled 2024-03-19: qty 510

## 2024-03-22 ENCOUNTER — Encounter (HOSPITAL_COMMUNITY)

## 2024-03-26 ENCOUNTER — Ambulatory Visit (HOSPITAL_COMMUNITY)
Admission: RE | Admit: 2024-03-26 | Discharge: 2024-03-26 | Disposition: A | Discharge status: Home / Self Care | Source: Ambulatory Visit | Attending: Nephrology | Admitting: Nephrology

## 2024-03-26 DIAGNOSIS — N189 Chronic kidney disease, unspecified: Secondary | ICD-10-CM | POA: Diagnosis not present

## 2024-03-26 DIAGNOSIS — D631 Anemia in chronic kidney disease: Secondary | ICD-10-CM | POA: Insufficient documentation

## 2024-03-26 MED ORDER — SODIUM CHLORIDE 0.9 % IV SOLN
510.0000 mg | INTRAVENOUS | Status: DC
  Administered 2024-03-26: 510 mg via INTRAVENOUS
  Filled 2024-03-26: qty 510

## 2024-04-09 DIAGNOSIS — Z961 Presence of intraocular lens: Secondary | ICD-10-CM | POA: Diagnosis not present

## 2024-04-09 DIAGNOSIS — H43813 Vitreous degeneration, bilateral: Secondary | ICD-10-CM | POA: Diagnosis not present

## 2024-04-09 DIAGNOSIS — H353231 Exudative age-related macular degeneration, bilateral, with active choroidal neovascularization: Secondary | ICD-10-CM | POA: Diagnosis not present

## 2024-04-09 DIAGNOSIS — H35033 Hypertensive retinopathy, bilateral: Secondary | ICD-10-CM | POA: Diagnosis not present

## 2024-04-29 ENCOUNTER — Ambulatory Visit (INDEPENDENT_AMBULATORY_CARE_PROVIDER_SITE_OTHER)

## 2024-04-29 VITALS — Ht 60.0 in | Wt 150.0 lb

## 2024-04-29 DIAGNOSIS — Z0000 Encounter for general adult medical examination without abnormal findings: Secondary | ICD-10-CM

## 2024-04-29 NOTE — Patient Instructions (Signed)
 Taylor Bush , Thank you for taking time out of your busy schedule to complete your Annual Wellness Visit with me. I enjoyed our conversation and look forward to speaking with you again next year. I, as well as your care team,  appreciate your ongoing commitment to your health goals. Please review the following plan we discussed and let me know if I can assist you in the future. Your Game plan/ To Do List     Follow up Visits: Next Medicare AWV with our clinical staff: 05/02/25 @ 11:30am televist   Have you seen your provider in the last 6 months (3 months if uncontrolled diabetes)? No Next Office Visit with your provider: 10/17/24 Physical  Clinician Recommendations:  Aim for 30 minutes of exercise or brisk walking, 6-8 glasses of water, and 5 servings of fruits and vegetables each day.       This is a list of the screening recommended for you and due dates:  Health Maintenance  Topic Date Due   DTaP/Tdap/Td vaccine (1 - Tdap) Never done   COVID-19 Vaccine (4 - 2024-25 season) 07/09/2023   Flu Shot  06/07/2024   Medicare Annual Wellness Visit  04/29/2025   Colon Cancer Screening  09/08/2025   Pneumococcal Vaccine for age over 49  Completed   DEXA scan (bone density measurement)  Completed   Hepatitis C Screening  Completed   Zoster (Shingles) Vaccine  Completed   HPV Vaccine  Aged Out   Meningitis B Vaccine  Aged Out    Advanced directives: (Declined) Advance directive discussed with you today. Even though you declined this today, please call our office should you change your mind, and we can give you the proper paperwork for you to fill out. Advance Care Planning is important because it:  [x]  Makes sure you receive the medical care that is consistent with your values, goals, and preferences  [x]  It provides guidance to your family and loved ones and reduces their decisional burden about whether or not they are making the right decisions based on your wishes.  Follow the link  provided in your after visit summary or read over the paperwork we have mailed to you to help you started getting your Advance Directives in place. If you need assistance in completing these, please reach out to us  so that we can help you!

## 2024-04-29 NOTE — Progress Notes (Signed)
 Subjective:   Taylor Bush is a 78 y.o. who presents for a Medicare Wellness preventive visit.  As a reminder, Annual Wellness Visits don't include a physical exam, and some assessments may be limited, especially if this visit is performed virtually. We may recommend an in-person follow-up visit with your provider if needed.  Visit Complete: Virtual I connected with  Taylor Bush on 04/29/24 by a audio enabled telemedicine application and verified that I am speaking with the correct person using two identifiers.  Patient Location: Home  Provider Location: Office/Clinic  I discussed the limitations of evaluation and management by telemedicine. The patient expressed understanding and agreed to proceed.  Vital Signs: Because this visit was a virtual/telehealth visit, some criteria may be missing or patient reported. Any vitals not documented were not able to be obtained and vitals that have been documented are patient reported.  VideoDeclined- This patient declined Librarian, academic. Therefore the visit was completed with audio only.  Persons Participating in Visit: Patient.  AWV Questionnaire: No: Patient Medicare AWV questionnaire was not completed prior to this visit.  Cardiac Risk Factors include: advanced age (>62men, >15 women);dyslipidemia;hypertension     Objective:    Today's Vitals   04/29/24 1131 04/29/24 1132  Weight: 150 lb (68 kg)   Height: 5' (1.524 m)   PainSc:  5    Body mass index is 29.29 kg/m.     04/29/2024   11:43 AM 10/02/2023    9:26 AM 09/22/2023    1:36 PM 05/03/2023   11:56 AM 02/23/2022   10:09 AM 12/08/2021    9:09 AM 06/29/2020    8:33 AM  Advanced Directives  Does Patient Have a Medical Advance Directive? No No No No No No No  Would patient like information on creating a medical advance directive?  No - Patient declined No - Patient declined No - Patient declined Yes (MAU/Ambulatory/Procedural Areas - Information  given) No - Patient declined No - Patient declined    Current Medications (verified) Outpatient Encounter Medications as of 04/29/2024  Medication Sig   amLODipine  (NORVASC ) 10 MG tablet TAKE 1 TABLET BY MOUTH DAILY   aspirin 81 MG tablet Take 81 mg by mouth daily.   carboxymethylcellulose (REFRESH PLUS) 0.5 % SOLN 1 drop 3 (three) times daily as needed (dry/irritated eyes.).   Faricimab -svoa (VABYSMO  IZ) 1 Dose by Intravitreal route every 6 (six) weeks.   lisinopril  (ZESTRIL ) 20 MG tablet TAKE 1 TABLET BY MOUTH DAILY   metoprolol  (TOPROL -XL) 200 MG 24 hr tablet TAKE 1 TABLET BY MOUTH DAILY   Multiple Vitamins-Minerals (PRESERVISION AREDS PO) Take 1 capsule by mouth daily at 6 (six) AM.   traMADol  (ULTRAM ) 50 MG tablet Take 1 tablet (50 mg total) by mouth every 12 (twelve) hours as needed.   HYDROcodone -acetaminophen  (NORCO) 7.5-325 MG tablet Take 1 tablet by mouth every 6 (six) hours as needed for moderate pain (pain score 4-6). (Patient not taking: Reported on 04/29/2024)   No facility-administered encounter medications on file as of 04/29/2024.    Allergies (verified) Prednisone and Statins   History: Past Medical History:  Diagnosis Date   Allergic rhinitis    Allergy    Anemia in chronic kidney disease (CKD)    Arthritis    B12 deficiency 09/13/2021   Blood transfusion 2012   and 2013   Blood transfusion without reported diagnosis    Cataract    bilateral sx   Chronic low back pain  with radiculopathy   CKD (chronic kidney disease) stage 3, GFR 30-59 ml/min (HCC) 11/29/2013   HLD (hyperlipidemia)    HTN (hypertension)    Hx of total hip arthroplasty 12/15/2011   Open-angle glaucoma    PAD (peripheral artery disease) (HCC)    with intermittent claudication, ABI 0.72 on Rt and 0.73 on L when assessed in 2010.    Past Surgical History:  Procedure Laterality Date   abnormal vascular studies  05/07/2009   ABI's 70, R SFA > 50%^ blockage    CATARACT EXTRACTION,  BILATERAL  2011   EXCISION OF TONGUE LESION WITH LASER N/A 08/31/2015   Procedure: BIOPSY AND CO2 LASER OF TONGUE LESION ;  Surgeon: Ida Loader, MD;  Location: [facility];  Service: ENT;  Laterality: N/A;   EXCISION OF TONGUE LESION WITH LASER Left 06/29/2020   Procedure: EXCISION OF TONGUE LESION/CO2 laser w/biopsy;  Surgeon: Loader Ida, MD;  Location: [facility];  Service: ENT;  Laterality: Left;   EXCISION OF TONGUE LESION WITH LASER Bilateral 10/02/2023   Procedure: ORAL BIOPSY AND CO2 LASER ABLATATION OF TONGUE LESION;  Surgeon: Loader Ida, MD;  Location: [facility];  Service: ENT;  Laterality: Bilateral;   EXCISION ORAL LESION WITH CO2 LASER N/A 12/08/2021   Procedure: EXCISION OF ORAL ORAL CAVITY LESION WITH CO2 LASER;  Surgeon: Loader Ida, MD;  Location: [facility];  Service: ENT;  Laterality: N/A;   EYE SURGERY     JOINT REPLACEMENT     OTHER SURGICAL HISTORY     ganglion cyst removed left wrist    TOTAL HIP ARTHROPLASTY  10/03/2011   Procedure: TOTAL HIP ARTHROPLASTY;  Surgeon: Dempsey GAILS Aluisio;  Location: [facility];  Service: Orthopedics;  Laterality: Right;   TOTAL HIP ARTHROPLASTY  12/21/2011   Procedure: TOTAL HIP ARTHROPLASTY;  Surgeon: Dempsey GAILS Moan, MD;  Location: [facility];  Service: Orthopedics;  Laterality: Left;   TUBAL LIGATION     Family History  Problem Relation Age of Onset   Colon polyps Mother 69   Heart attack Father        33s   Stroke Neg Hx        also no PAD or aneurysm   Colon cancer Neg Hx    Esophageal cancer Neg Hx    Stomach cancer Neg Hx    Rectal cancer Neg Hx    Social History   Socioeconomic History   Marital status: Widowed    Spouse name: Not on file   Number of children: Not on file   Years of education: Not on file   Highest education level: Not on file  Occupational History   Not on file  Tobacco Use   Smoking status: Former    Current packs/day: 0.00    Types: Cigarettes    Quit date:  11/07/2008    Years since quitting: 15.4   Smokeless tobacco: Never  Vaping Use   Vaping status: Never Used  Substance and Sexual Activity   Alcohol use: No   Drug use: No   Sexual activity: Not on file  Other Topics Concern   Not on file  Social History Narrative   Retired Haematologist, 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    Social Drivers of Health   Financial Resource Strain: Low Risk  (04/29/2024)   Overall Financial Resource Strain (CARDIA)    Difficulty of Paying Living Expenses: Not hard at all  Food Insecurity: No Food Insecurity (04/29/2024)  Hunger Vital Sign    Worried About Running Out of Food in the Last Year: Never true    Ran Out of Food in the Last Year: Never true  Transportation Needs: No Transportation Needs (04/29/2024)   PRAPARE - Administrator, Civil Service (Medical): No    Lack of Transportation (Non-Medical): No  Physical Activity: Insufficiently Active (04/29/2024)   Exercise Vital Sign    Days of Exercise per Week: 7 days    Minutes of Exercise per Session: 20 min  Stress: No Stress Concern Present (04/29/2024)   [employer] of Occupational Health - Occupational Stress Questionnaire    Feeling of Stress: Not at all  Social Connections: Moderately Isolated (04/29/2024)   Social Connection and Isolation Panel    Frequency of Communication with Friends and Family: More than three times a week    Frequency of Social Gatherings with Friends and Family: More than three times a week    Attends Religious Services: More than 4 times per year    Active Member of [employer] or Organizations: No    Attends Banker Meetings: Never    Marital Status: Widowed    Tobacco Counseling Counseling given: Not Answered    Clinical Intake:  Pre-visit preparation completed: Yes  Pain : 0-10 Pain Score: 5  Pain Type: Chronic pain Pain Location: Back Pain Orientation: Lower Pain Descriptors / Indicators: Aching Pain Onset: More than a month  ago Pain Frequency: Constant Pain Relieving Factors: tramadol , tylenol   Pain Relieving Factors: tramadol , tylenol   BMI - recorded: 29.29 Nutritional Status: BMI 25 -29 Overweight Nutritional Risks: None Diabetes: No  Lab Results  Component Value Date   HGBA1C 5.7 09/14/2023   HGBA1C 5.9 09/07/2022   HGBA1C 5.8 05/06/2019     How often do you need to have someone help you when you read instructions, pamphlets, or other written materials from your doctor or pharmacy?: 1 - Never  Interpreter Needed?: No  Comments: son with pt Information entered by :: B.Zuma Hust,LPN   Activities of Daily Living     04/29/2024   11:43 AM 10/02/2023    9:26 AM  In your present state of health, do you have any difficulty performing the following activities:  Hearing? 0   Vision? 1   Difficulty concentrating or making decisions? 0   Walking or climbing stairs? 1   Dressing or bathing? 0   Doing errands, shopping? 1 0  Preparing Food and eating ? N   Using the Toilet? N   In the past six months, have you accidently leaked urine? N   Do you have problems with loss of bowel control? N   Managing your Medications? N   Managing your Finances? N   Housekeeping or managing your Housekeeping? N     Patient Care Team: Watt Mirza, MD as PCP - General  I have updated your Care Teams any recent Medical Services you may have received from other providers in the past year.     Assessment:   This is a routine wellness examination for Taylor Bush.  Hearing/Vision screen Hearing Screening - Comments:: Pt says her hearing is good Vision Screening - Comments:: Pt says her vision is ok:has FRI:tzjmd glasses [employer]   Goals Addressed             This Visit's Progress    COMPLETED: healthy food choices   On track    Starting 04/27/2017, I will reduce of intake of ice cream and increase  intake of fruits and vegetables to 4-5 servings per day.     Patient Stated   On track    04/29/24- I  will continue to take medications as prescribed.      Patient Stated   On track    04/29/24-Would like to drink more water, eat healthier and be more active      COMPLETED: Patient Stated       Walk more and drink 6-8 glasses of water a day.       Depression Screen     04/29/2024   11:38 AM 05/03/2023   11:56 AM 02/23/2022   10:12 AM 09/13/2021    2:40 PM 08/31/2020    2:35 PM 05/09/2019    2:22 PM 05/01/2018   11:50 AM  PHQ 2/9 Scores  PHQ - 2 Score 0 0 0 0 0 0 0  PHQ- 9 Score       0    Fall Risk     04/29/2024   11:34 AM 05/03/2023   11:57 AM 02/23/2022   10:11 AM 09/13/2021    2:40 PM 08/31/2020    2:35 PM  Fall Risk   Falls in the past year? 0 0 0 0 1  Number falls in past yr: 0 0 0 0 0  Injury with Fall? 0 0 0 0 1  Risk for fall due to : No Fall Risks No Fall Risks No Fall Risks    Follow up Education provided;Falls prevention discussed Falls prevention discussed;Falls evaluation completed Falls prevention discussed        Data saved with a previous flowsheet row definition    MEDICARE RISK AT HOME:  Medicare Risk at Home Any stairs in or around the home?: No If so, are there any without handrails?: Yes Home free of loose throw rugs in walkways, pet beds, electrical cords, etc?: Yes Adequate lighting in your home to reduce risk of falls?: Yes Life alert?: No Use of a cane, walker or w/c?: No Grab bars in the bathroom?: Yes Shower chair or bench in shower?: No Elevated toilet seat or a handicapped toilet?: Yes  TIMED UP AND GO:  Was the test performed?  No  Cognitive Function: 6CIT completed    05/01/2018   11:52 AM 04/27/2017   11:56 AM  MMSE - Mini Mental State Exam  Orientation to time 5 5   Orientation to Place 5 5   Registration 3 3   Attention/ Calculation 0 0   Recall 3 3   Language- name 2 objects 0 0   Language- repeat 1 1  Language- follow 3 step command 3 3   Language- read & follow direction 0 0   Write a sentence 0 0   Copy design 0 0    Total score 20 20      Data saved with a previous flowsheet row definition        04/29/2024   11:44 AM 05/03/2023   12:00 PM  6CIT Screen  What Year? 0 points 0 points  What month? 0 points 0 points  What time? 0 points 0 points  Count back from 20 0 points 0 points  Months in reverse 0 points 0 points  Repeat phrase 0 points 0 points  Total Score 0 points 0 points    Immunizations Immunization History  Administered Date(s) Administered   Fluad Quad(high Dose 65+) 09/07/2022   Fluad Trivalent(High Dose 65+) 08/30/2023   Influenza, High Dose Seasonal PF 08/23/2016, 08/24/2017, 08/16/2018,  08/11/2020, 08/26/2021   Influenza-Unspecified 07/08/2014, 08/08/2015, 08/08/2015   PFIZER(Purple Top)SARS-COV-2 Vaccination 01/01/2020, 01/22/2020   Pfizer Covid-19 Vaccine Bivalent Booster 54yrs & up 09/22/2021   Pneumococcal Conjugate-13 03/04/2015   Pneumococcal Polysaccharide-23 09/05/2012   Zoster Recombinant(Shingrix) 08/05/2019, 12/03/2019    Screening Tests Health Maintenance  Topic Date Due   DTaP/Tdap/Td (1 - Tdap) Never done   COVID-19 Vaccine (4 - 2024-25 season) 07/09/2023   INFLUENZA VACCINE  06/07/2024   Medicare Annual Wellness (AWV)  04/29/2025   Colonoscopy  09/08/2025   Pneumococcal Vaccine: 50+ Years  Completed   DEXA SCAN  Completed   Hepatitis C Screening  Completed   Zoster Vaccines- Shingrix  Completed   HPV VACCINES  Aged Out   Meningococcal B Vaccine  Aged Out    Health Maintenance  Health Maintenance Due  Topic Date Due   DTaP/Tdap/Td (1 - Tdap) Never done   COVID-19 Vaccine (4 - 2024-25 season) 07/09/2023   Health Maintenance Items Addressed: None at this time  Additional Screening:  Vision Screening: Recommended annual ophthalmology exams for early detection of glaucoma and other disorders of the eye. Would you like a referral to an eye doctor? No    Dental Screening: Recommended annual dental exams for proper oral hygiene  Community  Resource Referral / Chronic Care Management: CRR required this visit?  No   CCM required this visit?  Appt scheduled with PCP and Lab visit   Plan:    I have personally reviewed and noted the following in the patient's chart:   Medical and social history Use of alcohol, tobacco or illicit drugs  Current medications and supplements including opioid prescriptions. Patient is currently taking opioid prescriptions. Information provided to patient regarding non-opioid alternatives. Patient advised to discuss non-opioid treatment plan with their provider. Functional ability and status Nutritional status Physical activity Advanced directives List of other physicians Hospitalizations, surgeries, and ER visits in previous 12 months Vitals Screenings to include cognitive, depression, and falls Referrals and appointments  In addition, I have reviewed and discussed with patient certain preventive protocols, quality metrics, and best practice recommendations. A written personalized care plan for preventive services as well as general preventive health recommendations were provided to patient.   Erminio LITTIE Saris, LPN   [id number]   After Visit Summary: (MyChart) Due to this being a telephonic visit, the after visit summary with patients personalized plan was offered to patient via MyChart   Notes: Nothing significant to report at this time.

## 2024-06-13 ENCOUNTER — Encounter (HOSPITAL_COMMUNITY): Payer: Self-pay | Admitting: Otolaryngology

## 2024-06-13 ENCOUNTER — Other Ambulatory Visit: Payer: Self-pay

## 2024-06-13 NOTE — Telephone Encounter (Signed)
 Called patient and left a voicemail regarding her STAT order for surgery. Advised patient to call me back to discuss surgery details.

## 2024-06-13 NOTE — Progress Notes (Signed)
 SDW call  Patient was given pre-op instructions over the phone. Patient verbalized understanding of instructions provided. Arrival time is slated for 1000, but per office she cannot arrive until 1100. Her son goes to dialysis and will be dropping her off.      PCP - Dr. Jacques Copland Cardiologist -  Pulmonary:    PPM/ICD - denies Device Orders - na Rep Notified - na   Chest x-ray - na EKG -  09/26/2023 Stress Test - 09/13/2011 ECHO -  Cardiac Cath -   Sleep Study/sleep apnea/CPAP: denies  Non-diabetic  Blood Thinner Instructions: denies Aspirin Instructions: states last dose 06/13/2024   ERAS Protcol - NPO   Anesthesia review: Yes.  PAD, HTN, PVD, HLD, CKD   Patient denies shortness of breath, fever, cough and chest pain over the phone call  Your procedure is scheduled on Friday June 14, 2024  Report to [facility] Main Entrance A at  1000  A.M., then check in with the Admitting office.  Call this number if you have problems the morning of surgery:  [phone]   If you have any questions prior to your surgery date call [phone]: Open Monday-Friday 8am-4pm If you experience any cold or flu symptoms such as cough, fever, chills, shortness of breath, etc. between now and your scheduled surgery, please notify us  at the above number    Remember:  Do not eat or drink after midnight the night before your surgery  Take these medicines the morning of surgery with A SIP OF WATER:  Amlodipine , metoprolol   As needed: Refresh eye drops, tramadol   As of today, STOP taking any Aspirin (unless otherwise instructed by your surgeon) Aleve, Naproxen, Ibuprofen, Motrin, Advil, Goody's, BC's, all herbal medications, fish oil, and all vitamins.

## 2024-06-13 NOTE — Progress Notes (Signed)
 Patient informed arrival time for surgery on 06/17/2024. Patient to arrive at 1215. Patient verbalized understanding. No further questions asked.

## 2024-06-13 NOTE — Progress Notes (Signed)
 Otolaryngology Office Note  HPI:    Taylor Bush is a 78 y.o. female who presents as a return  Patient.   Referring Provider: No ref. provider found   Chief complaint: Tongue.  HPI: Her left side of the tongue has gotten worse over the past couple of months.  PMH/Meds/All/SocHx/FamHx/ROS:   Medical History[1]  Surgical History[2]  No family history of bleeding disorders, wound healing problems or difficulty with anesthesia.      Current Medications[3]  A complete ROS was performed with pertinent positives/negatives noted in the HPI. The remainder of the ROS are negative.    Physical Exam:    BP 154/73 (BP Location: Left arm, Patient Position: Sitting)   Pulse 58   Temp 95.7 F (35.4 C) (Temporal)   Ht 1.524 m (5')   Wt 67.1 kg (148 lb)   BMI 28.90 kg/m    General:  Healthy and alert, in no distress, breathing easily. Normal affect. In a pleasant mood. Head: Normocephalic, atraumatic. No masses, or scars. Eyes: Pupils are equal, and reactive to light. Vision is grossly intact. No spontaneous or gaze nystagmus. Ears: Ear canals are clear. Tympanic membranes are intact, with normal landmarks and the middle ears are clear and healthy. Hearing: Grossly normal. Nose: Nasal cavities are clear with healthy mucosa, no polyps or exudate. Airways are patent. Face: No masses or scars, facial nerve function is symmetric. Oral Cavity: N 3 cm raised ulcerative lesion with surrounding leukoplakia left posterior lateral oral tongue.  No other masses identified. Dentition appears healthy. Oropharynx: Tonsils are symmetric. There are no mucosal masses identified. Tongue base appears normal and healthy. Larynx/Hypopharynx: deferred Chest: Deferred Neck: No palpable masses, no cervical adenopathy, no thyroid  nodules or enlargement. Neuro: Cranial nerves II-XII with normal function. Balance: Normal gate. Other findings: none.   Independent Review of Additional Tests or Records:   none  Procedures:  none   Impression & Plans:  New tongue mass worrisome for carcinoma.  Recommend partial glossectomy.  Final pathology will determine if nodal dissection will be necessary.   Medical Decision Making: #/Compl Problems  3     Data Rev  1  Management 3  (1-Straightforward, 2-Low, 3- Moderate, 4-High)  Ida VEAR Loader, MD        [1] Past Medical History: Diagnosis Date  . Hypertension   . Oral leukoplakia   [2] Past Surgical History: Procedure Laterality Date  . HIP SURGERY     Procedure: HIP SURGERY  [3]  Current Outpatient Medications:  .  acetaminophen -codeine  (TYLENOL  #3) 300-30 mg per tablet, TAKE 1 TABLET BY MOUTH EVERY 6 HOURS AS NEEDED FOR MODERATE PAIN., Disp: , Rfl:  .  amLODIPine  (NORVASC ) 10 mg tablet, TAKE 1 TABLET (10 MG TOTAL) BY MOUTH DAILY BEFORE BREAKFAST., Disp: , Rfl:  .  calcium citrate (CALCITRATE) 950 mg (200 mg calcium) tab, Take 950 mg by mouth., Disp: , Rfl:  .  cholecalciferol (VITAMIN D3) 2,000 unit cap capsule, Take  by mouth., Disp: , Rfl:  .  cyanocobalamin  (VITAMIN B12) 1,000 mcg tablet, Take 1,000 mcg by mouth., Disp: , Rfl:  .  HYDROcodone -acetaminophen  (NORCO) 7.5-325 mg per tablet, Take 1 tablet by mouth., Disp: , Rfl:  .  lisinopriL  (PRINIVIL ) 20 mg tablet, , Disp: , Rfl:  .  metoprolol  succinate (TOPROL  XL) 200 mg 24 hr tablet, TAKE 1 TABLET (200 MG TOTAL) BY MOUTH DAILY., Disp: , Rfl:  .  sodium bicarbonate 650 mg tablet, TAKE 1 TABLET (650 MG TOTAL) BY  MOUTH 2 (TWO) TIMES A DAY, Disp: , Rfl:  .  traMADoL  (ULTRAM ) 50 mg tablet, Take 50 mg by mouth., Disp: , Rfl:

## 2024-06-13 NOTE — Anesthesia Preprocedure Evaluation (Addendum)
 Anesthesia Evaluation  Patient identified by MRN, date of birth, ID band Patient awake    Reviewed: Allergy & Precautions, NPO status , Patient's Chart, lab work & pertinent test results  Airway Mallampati: III  TM Distance: >3 FB Neck ROM: Full    Dental  (+) Teeth Intact, Dental Advisory Given   Pulmonary former smoker   breath sounds clear to auscultation       Cardiovascular hypertension, Pt. on medications and Pt. on home beta blockers + Peripheral Vascular Disease   Rhythm:Regular Rate:Normal     Neuro/Psych  Neuromuscular disease  negative psych ROS   GI/Hepatic negative GI ROS, Neg liver ROS,,,  Endo/Other  negative endocrine ROS    Renal/GU Renal disease     Musculoskeletal  (+) Arthritis ,    Abdominal   Peds  Hematology  (+) Blood dyscrasia, anemia   Anesthesia Other Findings   Reproductive/Obstetrics                              Anesthesia Physical Anesthesia Plan  ASA: 3  Anesthesia Plan: General   Post-op Pain Management: Tylenol  PO (pre-op)* and Toradol  IV (intra-op)*   Induction: Intravenous  PONV Risk Score and Plan: 4 or greater and Ondansetron , Dexamethasone  and Treatment may vary due to age or medical condition  Airway Management Planned: Oral ETT  Additional Equipment: None  Intra-op Plan:   Post-operative Plan: Extubation in OR  Informed Consent: I have reviewed the patients History and Physical, chart, labs and discussed the procedure including the risks, benefits and alternatives for the proposed anesthesia with the patient or authorized representative who has indicated his/her understanding and acceptance.     Dental advisory given  Plan Discussed with: CRNA  Anesthesia Plan Comments: (PAT note written 06/13/2024 by Allison Zelenak, PA-C.  )         Anesthesia Quick Evaluation

## 2024-06-13 NOTE — Telephone Encounter (Signed)
 Spoke to patient. Left Partial Glossectomy surgery scheduled on 06/14/2024 at [facility].  Advised  the information for surgery will be emailed to her with instructions. Also advised patient that pre op anesthesia and the assessment team would be giving her call today.

## 2024-06-13 NOTE — H&P (View-Only) (Signed)
 Otolaryngology Office Note  HPI:    Taylor Bush is a 78 y.o. female who presents as a return  Patient.   Referring Provider: No ref. provider found   Chief complaint: Tongue.  HPI: Her left side of the tongue has gotten worse over the past couple of months.  PMH/Meds/All/SocHx/FamHx/ROS:   Medical History[1]  Surgical History[2]  No family history of bleeding disorders, wound healing problems or difficulty with anesthesia.      Current Medications[3]  A complete ROS was performed with pertinent positives/negatives noted in the HPI. The remainder of the ROS are negative.    Physical Exam:    BP 154/73 (BP Location: Left arm, Patient Position: Sitting)   Pulse 58   Temp 95.7 F (35.4 C) (Temporal)   Ht 1.524 m (5')   Wt 67.1 kg (148 lb)   BMI 28.90 kg/m    General:  Healthy and alert, in no distress, breathing easily. Normal affect. In a pleasant mood. Head: Normocephalic, atraumatic. No masses, or scars. Eyes: Pupils are equal, and reactive to light. Vision is grossly intact. No spontaneous or gaze nystagmus. Ears: Ear canals are clear. Tympanic membranes are intact, with normal landmarks and the middle ears are clear and healthy. Hearing: Grossly normal. Nose: Nasal cavities are clear with healthy mucosa, no polyps or exudate. Airways are patent. Face: No masses or scars, facial nerve function is symmetric. Oral Cavity: N 3 cm raised ulcerative lesion with surrounding leukoplakia left posterior lateral oral tongue.  No other masses identified. Dentition appears healthy. Oropharynx: Tonsils are symmetric. There are no mucosal masses identified. Tongue base appears normal and healthy. Larynx/Hypopharynx: deferred Chest: Deferred Neck: No palpable masses, no cervical adenopathy, no thyroid  nodules or enlargement. Neuro: Cranial nerves II-XII with normal function. Balance: Normal gate. Other findings: none.   Independent Review of Additional Tests or Records:   none  Procedures:  none   Impression & Plans:  New tongue mass worrisome for carcinoma.  Recommend partial glossectomy.  Final pathology will determine if nodal dissection will be necessary.   Medical Decision Making: #/Compl Problems  3     Data Rev  1  Management 3  (1-Straightforward, 2-Low, 3- Moderate, 4-High)  Ida VEAR Loader, MD        [1] Past Medical History: Diagnosis Date  . Hypertension   . Oral leukoplakia   [2] Past Surgical History: Procedure Laterality Date  . HIP SURGERY     Procedure: HIP SURGERY  [3]  Current Outpatient Medications:  .  acetaminophen -codeine  (TYLENOL  #3) 300-30 mg per tablet, TAKE 1 TABLET BY MOUTH EVERY 6 HOURS AS NEEDED FOR MODERATE PAIN., Disp: , Rfl:  .  amLODIPine  (NORVASC ) 10 mg tablet, TAKE 1 TABLET (10 MG TOTAL) BY MOUTH DAILY BEFORE BREAKFAST., Disp: , Rfl:  .  calcium citrate (CALCITRATE) 950 mg (200 mg calcium) tab, Take 950 mg by mouth., Disp: , Rfl:  .  cholecalciferol (VITAMIN D3) 2,000 unit cap capsule, Take  by mouth., Disp: , Rfl:  .  cyanocobalamin  (VITAMIN B12) 1,000 mcg tablet, Take 1,000 mcg by mouth., Disp: , Rfl:  .  HYDROcodone -acetaminophen  (NORCO) 7.5-325 mg per tablet, Take 1 tablet by mouth., Disp: , Rfl:  .  lisinopriL  (PRINIVIL ) 20 mg tablet, , Disp: , Rfl:  .  metoprolol  succinate (TOPROL  XL) 200 mg 24 hr tablet, TAKE 1 TABLET (200 MG TOTAL) BY MOUTH DAILY., Disp: , Rfl:  .  sodium bicarbonate 650 mg tablet, TAKE 1 TABLET (650 MG TOTAL) BY  MOUTH 2 (TWO) TIMES A DAY, Disp: , Rfl:  .  traMADoL  (ULTRAM ) 50 mg tablet, Take 50 mg by mouth., Disp: , Rfl:

## 2024-06-13 NOTE — Progress Notes (Signed)
 Anesthesia Chart Review: SAME DAY WORK-UP  Case: [id number] Date/Time: 06/14/24 1215   Procedure: GLOSSECTOMY, PARTIAL (Left)   Anesthesia type: General   Diagnosis:      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]     Tongue mass [K14.8]   Pre-op diagnosis:      Oral leukoplakia     Tongue mass   [facility] / [facility]   Surgeons: Jesus Oliphant, MD       DISCUSSION: Patient is a 78 year old female scheduled for the above procedure. S/p oral biopsy with CO2 laser ablation of tongue lesion on 10/02/23. Pathology report showed moderate squamous dysplasia, negative for invasive carcinoma, dysplasia present in lateral/side margin, background chronic mucositis and hyperparakeratosis. She was evaluated by Dr. Jesus on 06/13/24 and with new tongue mass worrisome for carcinoma. He wrote, Recommend partial glossectomy. Final pathology will determine if nodal dissection will be necessary.   History includes former smoker (quit 11/07/08), HTN, HLD, PAD, CKD (stage 3), anemia, open angle glaucoma, allergic rhinitis, osteoarthritis (right THA 10/03/11, left THA 12/21/11), oral leukoplakia.    Anesthesia team to evaluate on the day of surgery.    VS:  Wt Readings from Last 3 Encounters:  06/13/24 67.1 kg  04/29/24 68 kg  03/26/24 68 kg   BP Readings from Last 3 Encounters:  03/26/24 (!) 150/64  03/19/24 127/63  10/02/23 (!) 152/65   Pulse Readings from Last 3 Encounters:  03/26/24 61  03/19/24 60  10/02/23 63     PROVIDERS: Watt Mirza, MD is PCP  Dennise Capri, MD is nephrologist  LABS: For day of surgery. Last results in [facility]  include: Lab Results  Component Value Date   WBC 8.3 09/14/2023   HGB 10.1 (L) 09/14/2023   HCT 30.7 (L) 09/14/2023   PLT 324.0 09/14/2023   GLUCOSE 125 (H) 09/14/2023   CHOL 204 (H) 09/14/2023   TRIG 137.0 09/14/2023   HDL 57.80 09/14/2023   LDLDIRECT 163.0 09/13/2021   LDLCALC 118 (H) 09/14/2023   ALT 9 09/14/2023   AST 12 09/14/2023   NA 135 09/14/2023    K 4.9 09/14/2023   CL 102 09/14/2023   CREATININE 1.84 (H) 09/14/2023   BUN 21 09/14/2023   CO2 26 09/14/2023   TSH 2.28 09/14/2023   HGBA1C 5.7 09/14/2023     EKG: 11/25/22: NSR     CV: Nuclear stress test 09/13/2011: Impression: Normal stress nuclear study, EF 78%.  Past Medical History:  Diagnosis Date   Allergic rhinitis    Allergy    Anemia in chronic kidney disease (CKD)    Arthritis    B12 deficiency 09/13/2021   Blood transfusion 2012   and 2013   Blood transfusion without reported diagnosis    Cataract    bilateral sx   Chronic low back pain    with radiculopathy   CKD (chronic kidney disease) stage 3, GFR 30-59 ml/min (HCC) 11/29/2013   HLD (hyperlipidemia)    HTN (hypertension)    Hx of total hip arthroplasty 12/15/2011   Open-angle glaucoma    PAD (peripheral artery disease) (HCC)    with intermittent claudication, ABI 0.72 on Rt and 0.73 on L when assessed in 2010.     Past Surgical History:  Procedure Laterality Date   abnormal vascular studies  05/07/2009   ABI's 70, R SFA > 50%^ blockage    CATARACT EXTRACTION, BILATERAL  2011   EXCISION OF TONGUE LESION WITH LASER N/A 08/31/2015   Procedure: BIOPSY AND CO2  LASER OF TONGUE LESION ;  Surgeon: Ida Loader, MD;  Location: [facility];  Service: ENT;  Laterality: N/A;   EXCISION OF TONGUE LESION WITH LASER Left 06/29/2020   Procedure: EXCISION OF TONGUE LESION/CO2 laser w/biopsy;  Surgeon: Loader Ida, MD;  Location: [facility];  Service: ENT;  Laterality: Left;   EXCISION OF TONGUE LESION WITH LASER Bilateral 10/02/2023   Procedure: ORAL BIOPSY AND CO2 LASER ABLATATION OF TONGUE LESION;  Surgeon: Loader Ida, MD;  Location[facility];  Service: ENT;  Laterality: Bilateral;   EXCISION ORAL LESION WITH CO2 LASER N/A 12/08/2021   Procedure: EXCISION OF ORAL ORAL CAVITY LESION WITH CO2 LASER;  Surgeon: Loader Ida, MD;  Location: [facility];  Service: ENT;   Laterality: N/A;   EYE SURGERY     JOINT REPLACEMENT     OTHER SURGICAL HISTORY     ganglion cyst removed left wrist    TOTAL HIP ARTHROPLASTY  10/03/2011   Procedure: TOTAL HIP ARTHROPLASTY;  Surgeon: Dempsey LULLA Moan;  Location: [facility];  Service: Orthopedics;  Laterality: Right;   TOTAL HIP ARTHROPLASTY  12/21/2011   Procedure: TOTAL HIP ARTHROPLASTY;  Surgeon: Dempsey LULLA Moan, MD;  Location: [facility];  Service: Orthopedics;  Laterality: Left;   TUBAL LIGATION      MEDICATIONS: No current facility-administered medications for this encounter.    amLODipine  (NORVASC ) 10 MG tablet   aspirin 81 MG tablet   carboxymethylcellulose (REFRESH PLUS) 0.5 % SOLN   Faricimab -svoa (VABYSMO  IZ)   lisinopril  (ZESTRIL ) 20 MG tablet   metoprolol  (TOPROL -XL) 200 MG 24 hr tablet   traMADol  (ULTRAM ) 50 MG tablet   Last ASA 06/13/24.   Isaiah Ruder, PA-C Surgical Short Stay/Anesthesiology [facility] Phone [phone] [facility] Phone [phone] 06/13/2024 4:07 PM

## 2024-06-14 NOTE — Progress Notes (Signed)
 Patient informed on new arrival time for surgery. Patient to arrive at 1130 on 06/17/2024. Patient verbalized understanding. no further questions asked.

## 2024-06-14 NOTE — H&P (Signed)
 HPI:   Taylor Bush is a 78 y.o. female who presents as a return Patient.   Referring Provider: No ref. provider found  Chief complaint: Tongue.  HPI: Her left side of the tongue has gotten worse over the past couple of months.  PMH/Meds/All/SocHx/FamHx/ROS:   Medical History[1]  Surgical History[2]  No family history of bleeding disorders, wound healing problems or difficulty with anesthesia.     Current Medications[3]  A complete ROS was performed with pertinent positives/negatives noted in the HPI. The remainder of the ROS are negative.   Physical Exam:   BP 154/73 (BP Location: Left arm, Patient Position: Sitting)  Pulse 58  Temp 95.7 F (35.4 C) (Temporal)  Ht 1.524 m (5')  Wt 67.1 kg (148 lb)  BMI 28.90 kg/m   General: Healthy and alert, in no distress, breathing easily. Normal affect. In a pleasant mood. Head: Normocephalic, atraumatic. No masses, or scars. Eyes: Pupils are equal, and reactive to light. Vision is grossly intact. No spontaneous or gaze nystagmus. Ears: Ear canals are clear. Tympanic membranes are intact, with normal landmarks and the middle ears are clear and healthy. Hearing: Grossly normal. Nose: Nasal cavities are clear with healthy mucosa, no polyps or exudate. Airways are patent. Face: No masses or scars, facial nerve function is symmetric. Oral Cavity: N 3 cm raised ulcerative lesion with surrounding leukoplakia left posterior lateral oral tongue. No other masses identified. Dentition appears healthy. Oropharynx: Tonsils are symmetric. There are no mucosal masses identified. Tongue base appears normal and healthy. Larynx/Hypopharynx: deferred Chest: Deferred Neck: No palpable masses, no cervical adenopathy, no thyroid  nodules or enlargement. Neuro: Cranial nerves II-XII with normal function. Balance: Normal gate. Other findings: none.  Independent Review of Additional Tests or Records:  none  Procedures:  none  Impression & Plans:   New tongue mass worrisome for carcinoma. Recommend partial glossectomy. Final pathology will determine if nodal dissection will be necessary.

## 2024-06-17 ENCOUNTER — Encounter (HOSPITAL_COMMUNITY)
Admission: RE | Disposition: A | Discharge status: Home / Self Care | Payer: Self-pay | Source: Home / Self Care | Attending: Otolaryngology

## 2024-06-17 ENCOUNTER — Ambulatory Visit (HOSPITAL_COMMUNITY): Payer: Self-pay | Admitting: Vascular Surgery

## 2024-06-17 ENCOUNTER — Other Ambulatory Visit: Payer: Self-pay

## 2024-06-17 ENCOUNTER — Observation Stay (HOSPITAL_COMMUNITY)
Admission: RE | Admit: 2024-06-17 | Discharge: 2024-06-18 | Disposition: A | Discharge status: Home / Self Care | Attending: Otolaryngology | Admitting: Otolaryngology

## 2024-06-17 ENCOUNTER — Encounter (HOSPITAL_COMMUNITY): Payer: Self-pay | Admitting: Otolaryngology

## 2024-06-17 DIAGNOSIS — C029 Malignant neoplasm of tongue, unspecified: Secondary | ICD-10-CM

## 2024-06-17 DIAGNOSIS — Z87891 Personal history of nicotine dependence: Secondary | ICD-10-CM | POA: Insufficient documentation

## 2024-06-17 DIAGNOSIS — I129 Hypertensive chronic kidney disease with stage 1 through stage 4 chronic kidney disease, or unspecified chronic kidney disease: Secondary | ICD-10-CM

## 2024-06-17 DIAGNOSIS — I10 Essential (primary) hypertension: Secondary | ICD-10-CM | POA: Diagnosis not present

## 2024-06-17 DIAGNOSIS — Z7982 Long term (current) use of aspirin: Secondary | ICD-10-CM | POA: Diagnosis not present

## 2024-06-17 DIAGNOSIS — N183 Chronic kidney disease, stage 3 unspecified: Secondary | ICD-10-CM | POA: Diagnosis not present

## 2024-06-17 DIAGNOSIS — D101 Benign neoplasm of tongue: Secondary | ICD-10-CM | POA: Diagnosis present

## 2024-06-17 HISTORY — PX: GLOSSECTOMY, PARTIAL: SHX7581

## 2024-06-17 LAB — POCT I-STAT, CHEM 8
BUN: 24 mg/dL — ABNORMAL HIGH (ref 8–23)
Calcium, Ion: 1.2 mmol/L (ref 1.15–1.40)
Chloride: 105 mmol/L (ref 98–111)
Creatinine, Ser: 1.7 mg/dL — ABNORMAL HIGH (ref 0.44–1.00)
Glucose, Bld: 96 mg/dL (ref 70–99)
HCT: 34 % — ABNORMAL LOW (ref 36.0–46.0)
Hemoglobin: 11.6 g/dL — ABNORMAL LOW (ref 12.0–15.0)
Potassium: 5.1 mmol/L (ref 3.5–5.1)
Sodium: 137 mmol/L (ref 135–145)
TCO2: 22 mmol/L (ref 22–32)

## 2024-06-17 SURGERY — GLOSSECTOMY, PARTIAL
Anesthesia: General | Laterality: Left

## 2024-06-17 MED ORDER — DEXTROSE-SODIUM CHLORIDE 5-0.9 % IV SOLN: INTRAVENOUS | Status: DC

## 2024-06-17 MED ORDER — LIDOCAINE-EPINEPHRINE 1 %-1:100000 IJ SOLN
INTRAMUSCULAR | Status: AC
  Filled 2024-06-17: qty 1

## 2024-06-17 MED ORDER — ASPIRIN 81 MG PO TBEC
81.0000 mg | DELAYED_RELEASE_TABLET | Freq: Every day | ORAL | Status: DC
  Administered 2024-06-17 – 2024-06-18 (×4): 81 mg via ORAL
  Filled 2024-06-17 (×2): qty 1

## 2024-06-17 MED ORDER — CLINDAMYCIN PHOSPHATE 600 MG/50ML IV SOLN
INTRAVENOUS | Status: DC | PRN
  Administered 2024-06-17 (×2): 600 mg via INTRAVENOUS

## 2024-06-17 MED ORDER — ROCURONIUM BROMIDE 10 MG/ML (PF) SYRINGE
PREFILLED_SYRINGE | INTRAVENOUS | Status: DC | PRN
  Administered 2024-06-17 (×2): 50 mg via INTRAVENOUS

## 2024-06-17 MED ORDER — ONDANSETRON HCL 4 MG/2ML IJ SOLN
INTRAMUSCULAR | Status: AC
  Filled 2024-06-17: qty 4

## 2024-06-17 MED ORDER — ONDANSETRON HCL 4 MG/2ML IJ SOLN: 4.0000 mg | INTRAMUSCULAR | Status: DC | PRN

## 2024-06-17 MED ORDER — KETOROLAC TROMETHAMINE 30 MG/ML IJ SOLN
INTRAMUSCULAR | Status: AC
Start: 2024-06-17 — End: 2024-06-17
  Filled 2024-06-17: qty 3

## 2024-06-17 MED ORDER — DROPERIDOL 2.5 MG/ML IJ SOLN: 0.6250 mg | Freq: Once | INTRAMUSCULAR | Status: DC | PRN

## 2024-06-17 MED ORDER — SUGAMMADEX SODIUM 200 MG/2ML IV SOLN
INTRAVENOUS | Status: DC | PRN
  Administered 2024-06-17 (×2): 200 mg via INTRAVENOUS

## 2024-06-17 MED ORDER — LIDOCAINE 2% (20 MG/ML) 5 ML SYRINGE
INTRAMUSCULAR | Status: AC
Start: 2024-06-17 — End: 2024-06-17
  Filled 2024-06-17: qty 5

## 2024-06-17 MED ORDER — PROPOFOL 10 MG/ML IV BOLUS
INTRAVENOUS | Status: DC | PRN
  Administered 2024-06-17 (×2): 130 mg via INTRAVENOUS

## 2024-06-17 MED ORDER — FENTANYL CITRATE (PF) 250 MCG/5ML IJ SOLN
INTRAMUSCULAR | Status: AC
  Filled 2024-06-17: qty 5

## 2024-06-17 MED ORDER — MEPERIDINE HCL 25 MG/ML IJ SOLN: 6.2500 mg | INTRAMUSCULAR | Status: DC | PRN

## 2024-06-17 MED ORDER — IBUPROFEN 100 MG/5ML PO SUSP: 400.0000 mg | Freq: Four times a day (QID) | ORAL | Status: DC | PRN

## 2024-06-17 MED ORDER — LISINOPRIL 20 MG PO TABS
20.0000 mg | ORAL_TABLET | Freq: Every day | ORAL | Status: DC
  Administered 2024-06-17 – 2024-06-18 (×4): 20 mg via ORAL
  Filled 2024-06-17 (×2): qty 1

## 2024-06-17 MED ORDER — OXYCODONE HCL 5 MG PO TABS: 5.0000 mg | ORAL_TABLET | Freq: Once | ORAL | Status: DC | PRN

## 2024-06-17 MED ORDER — FENTANYL CITRATE (PF) 250 MCG/5ML IJ SOLN
INTRAMUSCULAR | Status: DC | PRN
  Administered 2024-06-17 (×2): 100 ug via INTRAVENOUS

## 2024-06-17 MED ORDER — DEXAMETHASONE SODIUM PHOSPHATE 10 MG/ML IJ SOLN
INTRAMUSCULAR | Status: DC | PRN
  Administered 2024-06-17 (×2): 10 mg via INTRAVENOUS

## 2024-06-17 MED ORDER — CEFAZOLIN SODIUM-DEXTROSE 2-3 GM-%(50ML) IV SOLR
INTRAVENOUS | Status: DC | PRN
  Administered 2024-06-17 (×2): 2 g via INTRAVENOUS

## 2024-06-17 MED ORDER — METOPROLOL SUCCINATE ER 100 MG PO TB24
200.0000 mg | ORAL_TABLET | Freq: Every day | ORAL | Status: DC
  Administered 2024-06-18 (×2): 200 mg via ORAL
  Filled 2024-06-17: qty 2

## 2024-06-17 MED ORDER — CEFAZOLIN SODIUM-DEXTROSE 2-4 GM/100ML-% IV SOLN
INTRAVENOUS | Status: AC
  Filled 2024-06-17: qty 100

## 2024-06-17 MED ORDER — EPHEDRINE SULFATE-NACL 50-0.9 MG/10ML-% IV SOSY
PREFILLED_SYRINGE | INTRAVENOUS | Status: DC | PRN
  Administered 2024-06-17 (×6): 5 mg via INTRAVENOUS

## 2024-06-17 MED ORDER — DEXAMETHASONE SODIUM PHOSPHATE 10 MG/ML IJ SOLN
INTRAMUSCULAR | Status: AC
  Filled 2024-06-17: qty 2

## 2024-06-17 MED ORDER — POLYVINYL ALCOHOL 1.4 % OP SOLN: 1.0000 [drp] | Freq: Three times a day (TID) | OPHTHALMIC | Status: DC | PRN

## 2024-06-17 MED ORDER — ACETAMINOPHEN 500 MG PO TABS
1000.0000 mg | ORAL_TABLET | Freq: Once | ORAL | Status: AC
  Administered 2024-06-17 (×2): 1000 mg via ORAL
  Filled 2024-06-17: qty 2

## 2024-06-17 MED ORDER — ACETAMINOPHEN 10 MG/ML IV SOLN: 1000.0000 mg | Freq: Once | INTRAVENOUS | Status: DC | PRN

## 2024-06-17 MED ORDER — HYDROCODONE-ACETAMINOPHEN 5-325 MG PO TABS
1.0000 | ORAL_TABLET | ORAL | Status: DC | PRN
  Administered 2024-06-17: 1 via ORAL
  Administered 2024-06-17 (×2): 2 via ORAL
  Administered 2024-06-17: 1 via ORAL
  Filled 2024-06-17: qty 2
  Filled 2024-06-17: qty 1

## 2024-06-17 MED ORDER — AMLODIPINE BESYLATE 10 MG PO TABS
10.0000 mg | ORAL_TABLET | Freq: Every day | ORAL | Status: DC
  Administered 2024-06-18 (×2): 10 mg via ORAL
  Filled 2024-06-17: qty 1

## 2024-06-17 MED ORDER — HYDROMORPHONE HCL 1 MG/ML IJ SOLN
0.2500 mg | INTRAMUSCULAR | Status: DC | PRN
  Administered 2024-06-17 (×4): 0.5 mg via INTRAVENOUS

## 2024-06-17 MED ORDER — LIDOCAINE 2% (20 MG/ML) 5 ML SYRINGE
INTRAMUSCULAR | Status: DC | PRN
  Administered 2024-06-17 (×2): 40 mg via INTRAVENOUS

## 2024-06-17 MED ORDER — ORAL CARE MOUTH RINSE: 15.0000 mL | Freq: Once | OROMUCOSAL | Status: AC

## 2024-06-17 MED ORDER — BACITRACIN ZINC 500 UNIT/GM EX OINT
TOPICAL_OINTMENT | CUTANEOUS | Status: AC
  Filled 2024-06-17: qty 28.35

## 2024-06-17 MED ORDER — 0.9 % SODIUM CHLORIDE (POUR BTL) OPTIME
TOPICAL | Status: DC | PRN
Start: 2024-06-17 — End: 2024-06-17
  Administered 2024-06-17 (×2): 1000 mL

## 2024-06-17 MED ORDER — ONDANSETRON HCL 4 MG PO TABS: 4.0000 mg | ORAL_TABLET | ORAL | Status: DC | PRN

## 2024-06-17 MED ORDER — HYDROMORPHONE HCL 1 MG/ML IJ SOLN
INTRAMUSCULAR | Status: AC
  Filled 2024-06-17: qty 1

## 2024-06-17 MED ORDER — LACTATED RINGERS IV SOLN: INTRAVENOUS | Status: DC

## 2024-06-17 MED ORDER — TRAMADOL HCL 50 MG PO TABS
50.0000 mg | ORAL_TABLET | Freq: Two times a day (BID) | ORAL | Status: DC | PRN
  Administered 2024-06-18 (×2): 50 mg via ORAL
  Filled 2024-06-17: qty 1

## 2024-06-17 MED ORDER — OXYCODONE HCL 5 MG/5ML PO SOLN: 5.0000 mg | Freq: Once | ORAL | Status: DC | PRN

## 2024-06-17 MED ORDER — ROCURONIUM BROMIDE 10 MG/ML (PF) SYRINGE
PREFILLED_SYRINGE | INTRAVENOUS | Status: AC
  Filled 2024-06-17: qty 10

## 2024-06-17 MED ORDER — CHLORHEXIDINE GLUCONATE 0.12 % MT SOLN
15.0000 mL | Freq: Once | OROMUCOSAL | Status: AC
  Administered 2024-06-17 (×2): 15 mL via OROMUCOSAL
  Filled 2024-06-17: qty 15

## 2024-06-17 MED ORDER — OXYMETAZOLINE HCL 0.05 % NA SOLN
NASAL | Status: AC
  Filled 2024-06-17: qty 30

## 2024-06-17 SURGICAL SUPPLY — 42 items
ATTRACTOMAT 16X20 MAGNETIC DRP (DRAPES) IMPLANT
BLADE SURG 15 STRL LF DISP TIS (BLADE) IMPLANT
CANISTER SUCTION 3000ML PPV (SUCTIONS) ×1 IMPLANT
CLEANER TIP ELECTROSURG 2X2 (MISCELLANEOUS) ×1 IMPLANT
CLIP APPLIE 9.375 MED OPEN (MISCELLANEOUS) IMPLANT
CNTNR URN SCR LID CUP LEK RST (MISCELLANEOUS) ×1 IMPLANT
CORD BIPOLAR FORCEPS 12FT (ELECTRODE) IMPLANT
COVER SURGICAL LIGHT HANDLE (MISCELLANEOUS) ×1 IMPLANT
DRAPE HALF SHEET 40X57 (DRAPES) ×2 IMPLANT
ELECT COATED BLADE 2.86 ST (ELECTRODE) ×1 IMPLANT
ELECTRODE REM PT RTRN 9FT ADLT (ELECTROSURGICAL) ×1 IMPLANT
FORCEPS BIPOLAR SPETZLER 8 1.0 (NEUROSURGERY SUPPLIES) IMPLANT
GAUZE 4X4 16PLY ~~LOC~~+RFID DBL (SPONGE) ×1 IMPLANT
GAUZE SPONGE 4X4 12PLY STRL (GAUZE/BANDAGES/DRESSINGS) IMPLANT
GLOVE ECLIPSE 7.5 STRL STRAW (GLOVE) ×1 IMPLANT
GOWN STRL REUS W/ TWL LRG LVL3 (GOWN DISPOSABLE) ×2 IMPLANT
KIT BASIN OR (CUSTOM PROCEDURE TRAY) ×1 IMPLANT
KIT TURNOVER KIT B (KITS) ×1 IMPLANT
LOCATOR NERVE 3 VOLT (DISPOSABLE) IMPLANT
NDL PRECISIONGLIDE 27X1.5 (NEEDLE) ×1 IMPLANT
NEEDLE PRECISIONGLIDE 27X1.5 (NEEDLE) ×1 IMPLANT
NS IRRIG 1000ML POUR BTL (IV SOLUTION) ×1 IMPLANT
PAD ARMBOARD POSITIONER FOAM (MISCELLANEOUS) ×1 IMPLANT
PENCIL FOOT CONTROL (ELECTRODE) ×1 IMPLANT
POSITIONER HEAD DONUT 9IN (MISCELLANEOUS) ×1 IMPLANT
SPONGE INTESTINAL PEANUT (DISPOSABLE) IMPLANT
SPONGE T-LAP 18X18 ~~LOC~~+RFID (SPONGE) ×1 IMPLANT
SURGILUBE 2OZ TUBE FLIPTOP (MISCELLANEOUS) IMPLANT
SUT CHROMIC 3 0 PS 2 (SUTURE) ×3 IMPLANT
SUT SILK 0 FSL (SUTURE) ×1 IMPLANT
SUT SILK 2 0 PERMA HAND 18 BK (SUTURE) IMPLANT
SUT SILK 3 0 REEL (SUTURE) IMPLANT
SUT SILK 3 0 SH CR/8 (SUTURE) ×1 IMPLANT
SUT SILK 4 0 REEL (SUTURE) ×1 IMPLANT
SUT VIC AB 3-0 SH 27XBRD (SUTURE) ×2 IMPLANT
SUT VIC AB 4-0 PS2 27 (SUTURE) IMPLANT
SUT VIC AB 4-0 RB1 18 (SUTURE) IMPLANT
TOWEL GREEN STERILE FF (TOWEL DISPOSABLE) ×1 IMPLANT
TRAY ENT MC OR (CUSTOM PROCEDURE TRAY) ×1 IMPLANT
TUBE CONNECTING 12X1/4 (SUCTIONS) ×1 IMPLANT
UNDERPAD 30X36 HEAVY ABSORB (UNDERPADS AND DIAPERS) ×1 IMPLANT
WATER STERILE IRR 1000ML POUR (IV SOLUTION) ×1 IMPLANT

## 2024-06-17 NOTE — Transfer of Care (Signed)
 Immediate Anesthesia Transfer of Care Note  Patient: Taylor Bush  Procedure(s) Performed: GLOSSECTOMY, PARTIAL (Left)  Patient Location: PACU  Anesthesia Type:General  Level of Consciousness: awake and alert   Airway & Oxygen Therapy: Patient Spontanous Breathing and Patient connected to face mask oxygen  Post-op Assessment: Report given to RN and Post -op Vital signs reviewed and stable  Post vital signs: Reviewed and stable  Last Vitals:  Vitals Value Taken Time  BP 164/67 06/17/24 14:18  Temp    Pulse 59 06/17/24 14:20  Resp 18 06/17/24 14:20  SpO2 99 % 06/17/24 14:20  Vitals shown include unfiled device data.  Last Pain:  Vitals:   06/17/24 1151  PainSc: 0-No pain         Complications: No notable events documented.

## 2024-06-17 NOTE — Anesthesia Postprocedure Evaluation (Signed)
 Anesthesia Post Note  Patient: ANGELETTE GANUS  Procedure(s) Performed: GLOSSECTOMY, PARTIAL (Left)     Patient location during evaluation: PACU Anesthesia Type: General Level of consciousness: awake and alert Pain management: pain level controlled Vital Signs Assessment: post-procedure vital signs reviewed and stable Respiratory status: spontaneous breathing, nonlabored ventilation, respiratory function stable and patient connected to nasal cannula oxygen Cardiovascular status: blood pressure returned to baseline and stable Postop Assessment: no apparent nausea or vomiting Anesthetic complications: no   No notable events documented.  Last Vitals:  Vitals:   06/17/24 1510 06/17/24 1537  BP:  (!) 159/65  Pulse: 62 67  Resp: 19 16  Temp: (!) 36.4 C 36.4 C  SpO2: (!) 88% 96%    Last Pain:  Vitals:   06/17/24 1559  TempSrc:   PainSc: 5                  Franky JONETTA Bald

## 2024-06-17 NOTE — Plan of Care (Signed)
 Pt admitted to 6N23 from PACU s/p Left partial glossectomy by Dr. Jesus. Pt is sitting up in bed with HOB up at least 30 degrees. SCDs placed on pt earlier and operating. No c/o pain at this time.

## 2024-06-17 NOTE — Interval H&P Note (Signed)
 History and Physical Interval Note:  06/17/2024 12:14 PM  Taylor Bush  has presented today for surgery, with the diagnosis of Oral leukoplakia Tongue mass.  The various methods of treatment have been discussed with the patient and family. After consideration of risks, benefits and other options for treatment, the patient has consented to  Procedure(s): GLOSSECTOMY, PARTIAL (Left) as a surgical intervention.  The patient's history has been reviewed, patient examined, no change in status, stable for surgery.  I have reviewed the patient's chart and labs.  Questions were answered to the patient's satisfaction.     Ida Loader

## 2024-06-17 NOTE — Anesthesia Procedure Notes (Signed)
 Procedure Name: Intubation Date/Time: 06/17/2024 12:30 PM  Performed by: Claudene Florina Boga, CRNAPre-anesthesia Checklist: Patient identified, Emergency Drugs available, Suction available and Patient being monitored Patient Re-evaluated:Patient Re-evaluated prior to induction Oxygen Delivery Method: Circle System Utilized Preoxygenation: Pre-oxygenation with 100% oxygen Induction Type: IV induction Ventilation: Mask ventilation without difficulty Laryngoscope Size: Mac and 3 Grade View: Grade I Tube type: Oral Tube size: 7.0 mm Number of attempts: 1 Airway Equipment and Method: Stylet Placement Confirmation: ETT inserted through vocal cords under direct vision, positive ETCO2 and breath sounds checked- equal and bilateral Secured at: 21 cm Tube secured with: Tape Dental Injury: Teeth and Oropharynx as per pre-operative assessment

## 2024-06-17 NOTE — TOC Initial Note (Signed)
 Transition of Care [facility]) - Initial/Assessment Note    Patient Details  Name: Taylor Bush MRN: [id number] Date of Birth: 22-Jul-1946  Transition of Care [facility]) CM/SW Contact:    Nola Devere Hands, RN Phone Number: 06/17/2024, 4:29 PM  Clinical Narrative:                 78 yr old female with Squamous cell CA,s/p partial glossectomy. From home, independent prior to admission. No needs at this time, [facility] Team will continue to follow.          Patient Goals and CMS Choice            Expected Discharge Plan and Services                                              Prior Living Arrangements/Services                       Activities of Daily Living   ADL Screening (condition at time of admission) Independently performs ADLs?: Yes (appropriate for developmental age) Is the patient deaf or have difficulty hearing?: No Does the patient have difficulty seeing, even when wearing glasses/contacts?: No Does the patient have difficulty concentrating, remembering, or making decisions?: No  Permission Sought/Granted                  Emotional Assessment              Admission diagnosis:  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] Tongue mass [K14.8] Tongue cancer (HCC) [C02.9] Patient Active Problem List   Diagnosis Date Noted   Tongue cancer (HCC) 06/17/2024   B12 deficiency 09/13/2021   Allergic rhinitis    CKD (chronic kidney disease) stage 3, GFR 30-59 ml/min (HCC) 11/29/2013   Anemia of chronic disease 12/16/2011   Hx of total hip arthroplasty 12/15/2011   Atherosclerosis of native artery of extremity with intermittent claudication (HCC) 06/17/2009   Lumbar back pain with radiculopathy affecting left lower extremity 06/03/2009   HYPERLIPIDEMIA 12/15/2008   Essential hypertension 12/15/2008   Open-angle glaucoma 10/27/2008   PCP:  Watt Mirza, MD Pharmacy:   [facility] [phone] - [address], [place] - [address] [place]  [address] Phone: [phone] Fax: [phone]  [facility]) - [place], [place] - [address] [facility] [address] [place] [address] [place] [address] Phone: [phone] Fax: [phone]  [facility] - [place], [place] - [address] [place]  [address] Phone: [phone] Fax: [phone]     Social Drivers of Health (SDOH) Social History: SDOH Screenings   Food Insecurity: No Food Insecurity (04/29/2024)  Housing: Unknown (04/29/2024)  Transportation Needs: No Transportation Needs (04/29/2024)  Utilities: Not At Risk (04/29/2024)  Alcohol  Screen: Low Risk  (04/29/2024)  Depression (PHQ2-9): Low Risk  (04/29/2024)  Financial Resource Strain: Low Risk  (04/29/2024)  Physical Activity: Insufficiently Active (04/29/2024)  Social Connections: Moderately Isolated (04/29/2024)  Stress: No Stress Concern Present (04/29/2024)  Tobacco Use: Medium Risk (06/17/2024)  Health Literacy: Adequate Health Literacy (04/29/2024)   SDOH Interventions:     Readmission Risk Interventions     No data to display

## 2024-06-17 NOTE — Op Note (Signed)
 OPERATIVE REPORT  DATE OF SURGERY: 06/17/2024  PATIENT:  Taylor Bush,  78 y.o. female  PRE-OPERATIVE DIAGNOSIS:  Oral lesion Tongue mass  POST-OPERATIVE DIAGNOSIS: Squamous cell carcinoma tongue  PROCEDURE:  Procedure(s): GLOSSECTOMY, PARTIAL, left  SURGEON:  Ida VEAR Loader, MD  ASSISTANTS: RNFA  ANESTHESIA:   General   EBL: 40 ml  DRAINS: None  LOCAL MEDICATIONS USED:  None  SPECIMEN: Left partial left glossectomy, single suture marks anterior margin, double suture marks superior margin.  Frozen section analysis, all margins clear.  COUNTS:  Correct  PROCEDURE DETAILS: The patient was taken to the operating room and placed on the operating table in the supine position. Following induction of general endotracheal anesthesia, the face was draped in a standard fashion.  Cheek retractors were used throughout the case.  Towel clip was used to hold the tongue outward into the right to expose the lesion.  Electrocautery was used to mark the proposed mucosal incisions all minimum 1 cm beyond the margins of the raised lesion and the anterior leukoplakia.  Electrocautery was then used to incise the mucosa and the submucosal tissue all the way through the intrinsic musculature.  Bipolar cautery was used on smaller vessels that were encountered.  The entire lesion was removed and sent for pathologic evaluation using frozen section.  Bipolar cautery was used to complete hemostasis in the surgical bed.  Closure was accomplished using interrupted 4-0 Vicryl sutures.  Oral cavity was irrigated with saline and suctioned.  Patient was awakened extubated and transferred to recovery in stable condition.    PATIENT DISPOSITION:  To PACU, stable

## 2024-06-17 NOTE — Plan of Care (Signed)
   Problem: Clinical Measurements: Goal: Diagnostic test results will improve Outcome: Progressing   Problem: Activity: Goal: Risk for activity intolerance will decrease Outcome: Progressing   Problem: Nutrition: Goal: Adequate nutrition will be maintained Outcome: Progressing   Problem: Safety: Goal: Ability to remain free from injury will improve Outcome: Progressing

## 2024-06-18 ENCOUNTER — Encounter (HOSPITAL_COMMUNITY): Payer: Self-pay | Admitting: Otolaryngology

## 2024-06-18 DIAGNOSIS — C029 Malignant neoplasm of tongue, unspecified: Secondary | ICD-10-CM | POA: Diagnosis not present

## 2024-06-18 MED ORDER — OXYCODONE-ACETAMINOPHEN 5-325 MG PO TABS
1.0000 | ORAL_TABLET | ORAL | 0 refills | Status: AC | PRN
Start: 2024-06-18 — End: 2024-06-25

## 2024-06-18 MED ORDER — ONDANSETRON 4 MG PO TBDP: 4.0000 mg | ORAL_TABLET | Freq: Three times a day (TID) | ORAL | 0 refills | Status: DC | PRN

## 2024-06-18 NOTE — Plan of Care (Signed)
  Problem: Education: Goal: Knowledge of General Education information will improve Description: Including pain rating scale, medication(s)/side effects and non-pharmacologic comfort measures Outcome: Adequate for Discharge   Problem: Health Behavior/Discharge Planning: Goal: Ability to manage health-related needs will improve Outcome: Adequate for Discharge   Problem: Clinical Measurements: Goal: Ability to maintain clinical measurements within normal limits will improve Outcome: Adequate for Discharge Goal: Will remain free from infection Outcome: Adequate for Discharge Goal: Diagnostic test results will improve Outcome: Adequate for Discharge Goal: Respiratory complications will improve Outcome: Adequate for Discharge Goal: Cardiovascular complication will be avoided Outcome: Adequate for Discharge   Problem: Activity: Goal: Risk for activity intolerance will decrease Outcome: Adequate for Discharge   Problem: Nutrition: Goal: Adequate nutrition will be maintained Outcome: Adequate for Discharge   Problem: Coping: Goal: Level of anxiety will decrease Outcome: Adequate for Discharge   Problem: Elimination: Goal: Will not experience complications related to bowel motility Outcome: Adequate for Discharge Goal: Will not experience complications related to urinary retention Outcome: Adequate for Discharge   Problem: Pain Managment: Goal: General experience of comfort will improve and/or be controlled Outcome: Adequate for Discharge   Problem: Safety: Goal: Ability to remain free from injury will improve Outcome: Adequate for Discharge   Problem: Skin Integrity: Goal: Risk for impaired skin integrity will decrease Outcome: Adequate for Discharge   Problem: Education: Goal: Knowledge of the prescribed therapeutic regimen will improve Outcome: Adequate for Discharge   Problem: Activity: Goal: Ability to tolerate increased activity will improve Outcome: Adequate for  Discharge   Problem: Health Behavior/Discharge Planning: Goal: Identification of resources available to assist in meeting health care needs will improve Outcome: Adequate for Discharge   Problem: Nutrition: Goal: Maintenance of adequate nutrition will improve Outcome: Adequate for Discharge   Problem: Clinical Measurements: Goal: Complications related to the disease process, condition or treatment will be avoided or minimized Outcome: Adequate for Discharge   Problem: Respiratory: Goal: Will regain and/or maintain adequate ventilation Outcome: Adequate for Discharge   Problem: Skin Integrity: Goal: Demonstration of wound healing without infection will improve Outcome: Adequate for Discharge

## 2024-06-18 NOTE — Progress Notes (Signed)
 Pt states she was unable to sleep at all over night d/t the Norco-she took the tramadol  but states it really doesn't help. She denies the ibuprofen . Rates tongue pain at a 5. Ice and [employer] some.

## 2024-06-18 NOTE — Progress Notes (Signed)
 Subjective: Had trouble sleeping overnight but otherwise doing well, taking liquids well.  No trouble breathing.  Objective: Vital signs in last 24 hours: Temp:  [97.5 F (36.4 C)-98.7 F (37.1 C)] 98.1 F (36.7 C) (08/12 0747) Pulse Rate:  [58-72] 72 (08/12 0747) Resp:  [16-19] 18 (08/12 0747) BP: (141-169)/(65-82) 157/82 (08/12 0747) SpO2:  [88 %-99 %] 97 % (08/12 0747) Weight:  [67.1 kg] 67.1 kg (08/11 1131) Weight change:  Last BM Date : 06/17/24  Intake/Output from previous day: 08/11 0701 - 08/12 0700 In: 920 [P.O.:120; I.V.:800] Out: 20 [Blood:20] Intake/Output this shift: No intake/output data recorded.  PHYSICAL EXAM: Awake and alert.  Breathing and voice are clear.  Slight dysarthria as expected.  Tongue closure is intact and mobility is normal.  No bleeding or hematoma.  Lab Results: Recent Labs    06/17/24 1147  HGB 11.6*  HCT 34.0*   BMET Recent Labs    06/17/24 1147  NA 137  K 5.1  CL 105  GLUCOSE 96  BUN 24*  CREATININE 1.70*    Studies/Results: No results found.  Medications: I have reviewed the patient's current medications.  Assessment/Plan: Stable postop.  Will discharge home today.  LOS: 0 days    Ida Loader 06/18/2024, 8:43 AM

## 2024-06-18 NOTE — Discharge Summary (Signed)
 Physician Discharge Summary  Patient ID: Taylor Bush MRN: [id number] DOB/AGE: 78-May-1947 78 y.o.  Admit date: 06/17/2024 Discharge date: 06/18/2024  Admission Diagnoses: Tongue mass  Discharge Diagnoses:  Principal Problem:   Tongue cancer [facility])   Discharged Condition: good  Hospital Course: No complications  Consults: none  Significant Diagnostic Studies: none  Treatments: surgery: Partial glossectomy  Discharge Exam: Blood pressure (!) 157/82, pulse 72, temperature 98.1 F (36.7 C), temperature source Oral, resp. rate 18, height 5' (1.524 m), weight 67.1 kg, SpO2 97%. PHYSICAL EXAM: Awake and alert.  Breathing and voice are clear.  Suture line intact.  Disposition: Discharge disposition: 01-Home or Self Care       Discharge Instructions     Diet - low sodium heart healthy   Complete by: As directed    Increase activity slowly   Complete by: As directed    No wound care   Complete by: As directed       Allergies as of 06/18/2024       Reactions   Prednisone Other (See Comments)   Joint pain,joint stiffness   Statins Rash   hands broke out        Medication List     TAKE these medications    amLODipine  10 MG tablet Commonly known as: NORVASC  TAKE 1 TABLET BY MOUTH DAILY   aspirin  81 MG tablet Take 81 mg by mouth daily.   carboxymethylcellulose 0.5 % Soln Commonly known as: REFRESH PLUS 1 drop 3 (three) times daily as needed (dry/irritated eyes.). Use when get injection in eye   lisinopril  20 MG tablet Commonly known as: ZESTRIL  TAKE 1 TABLET BY MOUTH DAILY   metoprolol  200 MG 24 hr tablet Commonly known as: TOPROL -XL TAKE 1 TABLET BY MOUTH DAILY   ondansetron  4 MG disintegrating tablet Commonly known as: ZOFRAN -ODT Take 1 tablet (4 mg total) by mouth every 8 (eight) hours as needed for nausea or vomiting.   oxyCODONE -acetaminophen  5-325 MG tablet Commonly known as: Percocet Take 1 tablet by mouth every 4 (four) hours as needed  for up to 7 days for severe pain (pain score 7-10).   traMADol  50 MG tablet Commonly known as: ULTRAM  Take 1 tablet (50 mg total) by mouth every 12 (twelve) hours as needed.   VABYSMO  IZ 1 Dose by Intravitreal route every 6 (six) weeks.        Follow-up Information     Jesus Oliphant, MD. Schedule an appointment as soon as possible for a visit in 2 week(s).   Specialty: Otolaryngology Contact information: [address] [place] [address] [phone]                 Signed: Oliphant Jesus 06/18/2024, 8:46 AM

## 2024-06-18 NOTE — Progress Notes (Signed)
 Removed IV, Site clean, dry and intact.  See LDA for information on wounds at discharge. Patient states all belongings brought to the hospital at time of admission are accounted for and packed to take home.  Patient informed and expressed understanding where to pick up discharge medications.  [facility] contacted to transport patient to Discharge lounge to wait for transportation home.

## 2024-06-18 NOTE — Discharge Instructions (Signed)
 Continue with soft diet.  Okay to brush teeth as normally.  Rinse mouth with warm salt water several times daily.

## 2024-06-20 LAB — SURGICAL PATHOLOGY

## 2024-06-25 ENCOUNTER — Emergency Department (HOSPITAL_COMMUNITY)
Admission: EM | Admit: 2024-06-25 | Discharge: 2024-06-26 | Disposition: A | Discharge status: Home / Self Care | Attending: Emergency Medicine | Admitting: Emergency Medicine

## 2024-06-25 DIAGNOSIS — Z7982 Long term (current) use of aspirin: Secondary | ICD-10-CM | POA: Diagnosis not present

## 2024-06-25 DIAGNOSIS — Z79899 Other long term (current) drug therapy: Secondary | ICD-10-CM | POA: Diagnosis not present

## 2024-06-25 DIAGNOSIS — S91312A Laceration without foreign body, left foot, initial encounter: Secondary | ICD-10-CM | POA: Diagnosis not present

## 2024-06-25 DIAGNOSIS — Z23 Encounter for immunization: Secondary | ICD-10-CM | POA: Diagnosis not present

## 2024-06-25 DIAGNOSIS — W540XXA Bitten by dog, initial encounter: Secondary | ICD-10-CM | POA: Diagnosis not present

## 2024-06-25 DIAGNOSIS — S99922A Unspecified injury of left foot, initial encounter: Secondary | ICD-10-CM | POA: Diagnosis present

## 2024-06-26 ENCOUNTER — Other Ambulatory Visit: Payer: Self-pay

## 2024-06-26 ENCOUNTER — Emergency Department (HOSPITAL_COMMUNITY)

## 2024-06-26 ENCOUNTER — Encounter (HOSPITAL_COMMUNITY): Payer: Self-pay

## 2024-06-26 MED ORDER — LIDOCAINE HCL (PF) 1 % IJ SOLN
5.0000 mL | Freq: Once | INTRAMUSCULAR | Status: AC
  Administered 2024-06-26: 5 mL via INTRADERMAL
  Filled 2024-06-26: qty 5

## 2024-06-26 MED ORDER — AMOXICILLIN-POT CLAVULANATE 875-125 MG PO TABS: 1.0000 | ORAL_TABLET | Freq: Two times a day (BID) | ORAL | 0 refills | Status: DC

## 2024-06-26 MED ORDER — AMOXICILLIN-POT CLAVULANATE 875-125 MG PO TABS
1.0000 | ORAL_TABLET | Freq: Once | ORAL | Status: AC
  Administered 2024-06-26: 1 via ORAL
  Filled 2024-06-26: qty 1

## 2024-06-26 MED ORDER — TETANUS-DIPHTH-ACELL PERTUSSIS 5-2.5-18.5 LF-MCG/0.5 IM SUSY
0.5000 mL | PREFILLED_SYRINGE | Freq: Once | INTRAMUSCULAR | Status: AC
  Administered 2024-06-26: 0.5 mL via INTRAMUSCULAR
  Filled 2024-06-26: qty 0.5

## 2024-06-26 NOTE — ED Triage Notes (Signed)
 Pt states that she was getting in bed and her dog came out from under her bed and bit her on the left foot. Pt states that the dog is not up to date on shots. [employer] was notified and currently at pt's house attempting to take dog in custody.

## 2024-06-26 NOTE — ED Notes (Signed)
 Pt has flap type wound on top of left foot. Wound irrigated with NS and nonadhering dressing applied followed by kerlix. Pt elevating foot while awaiting xray.

## 2024-06-26 NOTE — Discharge Instructions (Addendum)
 Take the prescribed medication as directed.  Keep sutures clean and dry. Follow-up with your primary care doctor for suture removal in the next 7-10 days. Return to the ED for new or worsening symptoms.

## 2024-06-26 NOTE — ED Notes (Signed)
 Patient ambulated to the bathroom independently.

## 2024-06-26 NOTE — ED Provider Notes (Cosign Needed Addendum)
 Taylor Bush AT [facility] Provider Note   CSN: [id number] Arrival date & time: 06/25/24  2358     Patient presents with: Animal Bite   Taylor Bush is a 78 y.o. female.   The history is provided by the patient and medical records.  Animal Bite  78 y.o. F here with dog bite to left foot that occurred PTA.  States she was getting ready for bed and her own [place] shepard nipped at her foot.  Sustained semi-circular type laceration to dorsal foot.  Dog is not UTD on vaccines but remains in the house.  [employer] was contacted and animal control notified to quarantine dog for monitoring.  Tetanus updated in triage and given first dose of augmentin .  Prior to Admission medications   Medication Sig Start Date End Date Taking? Authorizing Provider  amoxicillin -clavulanate (AUGMENTIN ) 875-125 MG tablet Take 1 tablet by mouth every 12 (twelve) hours. 06/26/24  Yes Jarold Olam CHRISTELLA, PA-C  amLODipine  (NORVASC ) 10 MG tablet TAKE 1 TABLET BY MOUTH DAILY 11/19/23   Copland, Jacques, MD  aspirin  81 MG tablet Take 81 mg by mouth daily.    [provider]  carboxymethylcellulose (REFRESH PLUS) 0.5 % SOLN 1 drop 3 (three) times daily as needed (dry/irritated eyes.). Use when get injection in eye    [provider]  Faricimab -svoa (VABYSMO  IZ) 1 Dose by Intravitreal route every 6 (six) weeks.    [provider]  lisinopril  (ZESTRIL ) 20 MG tablet TAKE 1 TABLET BY MOUTH DAILY 11/19/23   Copland, Jacques, MD  metoprolol  (TOPROL -XL) 200 MG 24 hr tablet TAKE 1 TABLET BY MOUTH DAILY 11/19/23   Copland, Jacques, MD  ondansetron  (ZOFRAN -ODT) 4 MG disintegrating tablet Take 1 tablet (4 mg total) by mouth every 8 (eight) hours as needed for nausea or vomiting. 06/18/24   Jesus Oliphant, MD  traMADol  (ULTRAM ) 50 MG tablet Take 1 tablet (50 mg total) by mouth every 12 (twelve) hours as needed. 02/15/24   Copland, Jacques, MD    Allergies: Prednisone and Statins     Review of Systems  Skin:  Positive for wound.  All other systems reviewed and are negative.   Updated Vital Signs BP (!) 166/75   Pulse 67   Temp 98.2 F (36.8 C) (Oral)   Resp 16   SpO2 100%   Physical Exam Vitals and nursing note reviewed.  Constitutional:      Appearance: She is well-developed.  HENT:     Head: Normocephalic and atraumatic.  Eyes:     Conjunctiva/sclera: Conjunctivae normal.     Pupils: Pupils are equal, round, and reactive to light.  Cardiovascular:     Rate and Rhythm: Normal rate and regular rhythm.     Heart sounds: Normal heart sounds.  Pulmonary:     Effort: Pulmonary effort is normal.  Musculoskeletal:        General: Normal range of motion.     Cervical back: Normal range of motion.     Comments: 2.5 cm semi circular, flap type laceration to dorsal left foot, wound is hemostatic but gaping substantially, no tendon/vascular injury noted, wiggling toes on command, DP pulse intact 2 other small puncture wounds noted adjacent to larger laceration  Skin:    General: Skin is warm and dry.  Neurological:     Mental Status: She is alert and oriented to person, place, and time.     (all labs ordered are listed, but only abnormal results are displayed) Labs  Reviewed - No data to display  EKG: None  Radiology: DG Foot Complete Left Result Date: 06/26/2024 CLINICAL DATA:  Dog bite. EXAM: LEFT FOOT - COMPLETE 3+ VIEW COMPARISON:  None are available FINDINGS: No acute fracture or dislocation. No radiopaque foreign body. Soft tissue injury about the dorsum of the foot. IMPRESSION: No acute fracture or dislocation. Soft tissue injury about the dorsum of the foot. Electronically Signed   By: Norman Gatlin M.D.   On: 06/26/2024 00:54     Procedures   LACERATION REPAIR Performed by: Olam CHRISTELLA Slocumb Authorized by: Olam CHRISTELLA Slocumb Consent: Verbal consent obtained. Risks and benefits: risks, benefits and alternatives were discussed Consent given by:  patient Patient identity confirmed: provided demographic data Prepped and Draped in normal sterile fashion Wound explored  Laceration Location: left dorsal foot  Laceration Length: 2.5cm, irregular semi-circular  No Foreign Bodies seen or palpated  Anesthesia: local infiltration  Local anesthetic: lidocaine  1% without epinephrine   Anesthetic total: 5 ml  Irrigation method: syringe Amount of cleaning: standard  Skin closure: 4-0 prolene  Number of sutures: 8  Technique: simple interrupted  Patient tolerance: Patient tolerated the procedure well with no immediate complications.   Medications Ordered in the ED  Tdap (BOOSTRIX ) injection 0.5 mL (0.5 mLs Intramuscular Given 06/26/24 0011)  amoxicillin -clavulanate (AUGMENTIN ) 875-125 MG per tablet 1 tablet (1 tablet Oral Given 06/26/24 0023)  lidocaine  (PF) (XYLOCAINE ) 1 % injection 5 mL (5 mLs Intradermal Given 06/26/24 0301)                                    Medical Decision Making Amount and/or Complexity of Data Reviewed Radiology: ordered and independent interpretation performed.  Risk Prescription drug management.   78 year old female presenting to the ED with dog bite to left dorsal foot.  This is from her own dog.  Not up-to-date on vaccines but animal control has been contacted already and taking dog into custody for monitoring.  Tetanus was updated in triage and first dose of Augmentin  given.  Semicircular wound to left dorsal foot, this is substantially gaping but hemostatic.  There is no tendon or vascular injury present.  Her foot remains neurovascularly intact.  X-ray is negative for any acute bony findings.  This is a dog bite but with substantial gaping, this does require approximation.  Wound was copiously irrigated on arrival and again just prior to repair. Laceration repaired as above, tolerated well.  Home wound care instructions given.  Can follow-up with PCP for suture removal in 7 to 10 days.  Dog  currently being monitored by animal control, will hold off on rabies vaccines as per protocol at this time.  Return here for new concerns.  Final diagnoses:  Dog bite, initial encounter    ED Discharge Orders          Ordered    amoxicillin -clavulanate (AUGMENTIN ) 875-125 MG tablet  Every 12 hours        06/26/24 0309               [place] Olam CHRISTELLA, PA-C 06/26/24 0313    [place] Olam CHRISTELLA, PA-C 06/26/24 0330    Bari Charmaine FALCON, MD 06/28/24 [phone]

## 2024-06-26 NOTE — ED Provider Triage Note (Signed)
 Emergency Medicine Provider Triage Evaluation Note  Taylor Bush , a 77 y.o. female  was evaluated in triage.  Pt complains of dog bite.  Was bitten by her own dog.  States the dog isn't current on immunizations, but has been a house pet since a puppy.  Tdap given in triage.  Review of Systems  Positive: Dog bite to left foot Negative:   Physical Exam  BP (!) 166/75   Pulse 67   Temp 98.2 F (36.8 C) (Oral)   Resp 16   SpO2 100%  Gen:   Awake, no distress   Resp:  Normal effort  MSK:   Moves extremities without difficulty  Other:  2.5 semi circular laceration to the top of the left foot  Medical Decision Making  Medically screening exam initiated at 12:18 AM.  Appropriate orders placed.  Taylor Bush was informed that the remainder of the evaluation will be completed by another provider, this initial triage assessment does not replace that evaluation, and the importance of remaining in the ED until their evaluation is complete.     Vicky Charleston, PA-C 06/26/24 [phone]

## 2024-06-27 LAB — HM MAMMOGRAPHY

## 2024-07-01 ENCOUNTER — Encounter: Payer: Self-pay | Admitting: Family Medicine

## 2024-07-05 ENCOUNTER — Telehealth: Payer: Self-pay | Admitting: Family Medicine

## 2024-07-05 NOTE — Progress Notes (Signed)
 Subjective Patient ID: Taylor Bush is a 78 y.o. female.    78 year old female presents to clinic for follow-up visit on dog bite to the left foot that occurred 9 days ago.  Patient was seen in the emergency department and laceration was closed with 8 simple interrupted sutures.  Patient was placed on Augmentin  twice daily for 10 days and advised to have sutures removed 10 days later.  She presents today for suture removal.  She reports significant pain to the left foot especially when she is walking.  She reports moderate swelling and redness to the left foot.  Occasionally she will have a discharge from the wound.  She denies fever, chills, body aches and other signs of systemic infection.  No other complaints on today's visit.   History provided by:  Patient   Review of Systems  Constitutional:  Negative for chills, fatigue and fever.  Gastrointestinal:  Negative for abdominal pain, diarrhea, nausea and vomiting.  Musculoskeletal:  Negative for arthralgias and myalgias.       Left foot pain  Skin:  Positive for color change and wound. Negative for pallor and rash.  Neurological:  Negative for weakness, numbness and headaches.   Patient History  Allergies: Allergies  Allergen Reactions  . Prednisone Other and Rash    Joint pain,joint stiffness  Other reaction(s): Other (See Comments)  Joint pain,joint stiffness  Joint pain,joint stiffness  . Statins Rash    hands broke out    Past Medical History:  Diagnosis Date  . Cancer (CMS/HCC) 06/2024   TONGUE CANCER REMOVAL   History reviewed. No pertinent surgical history. Social History   Socioeconomic History  . Marital status: Not on file    Spouse name: Not on file  . Number of children: Not on file  . Years of education: Not on file  . Highest education level: Not on file  Occupational History  . Not on file  Tobacco Use  . Smoking status: Never  . Smokeless tobacco: Never  Substance and Sexual Activity  . Alcohol   use: Not on file  . Drug use: Not on file  . Sexual activity: Not on file  Other Topics Concern  . Not on file  Social History Narrative  . Not on file   History reviewed. No pertinent family history. Current Outpatient Medications on File Prior to Visit  Medication Sig Dispense Refill  . amLODIPine  (Norvasc ) 10 MG tablet TAKE 1 TABLET (10 MG TOTAL) BY MOUTH DAILY BEFORE BREAKFAST.    . lisinopril  20 MG tablet     . Aspirin  (BUFFERIN LOW DOSE PO) Take 81 mg by mouth.     No current facility-administered medications on file prior to visit.    Objective  Vitals:   07/05/24 1426  BP: (!) 180/82  Pulse: 62  Resp: 18  Temp: 36.8 C (98.3 F)  TempSrc: Tympanic  SpO2: 99%  Weight: 75.8 kg  Height: 5'  PainSc:   5     Physical Exam  GENERAL APPEARANCE: afebrile, non-toxic and not acutely ill-appearing in NAD. Patient is speaking in full sentences with no increased work of breathing. EYES: EOM intact, PERRLA, with no conjunctival injection noted. HEART: RRR, S1 and S2 present with no obvious murmur noted. No rubs, gallops or clicks noted. LUNGS: CTA bilaterally with no wheezing, ronchi, rales or crackles noted.  SKIN: Moderate erythema and swelling noted to the dorsal aspect of the left foot that extends to the left ankle. 2.5 cm irregular round wound noted  to the dorsal aspect of the left foot. Wound edges are macerated with purulent discharge present. Dark red/black area of tissue noted to the center of the wound. Peripheral pulses are present and bounding. Adequate peripheral capillary refill is noted. MSK: ROM of the left foot is limited due to pain and swelling.  NEURO: Alert and oriented x 3. No motor or sensory deficits noted to the left foot.   Results for orders placed or performed in visit on 07/05/24  XR foot 3+ views left   Narrative   Exam X-Ray   Views of the   Foot  Comparison None provided.    Findings Subcutaneous emphysema of the dorsum of the foot with  soft tissue swelling  There is no clear radiographic evidence for acute fracture on these views.   There is no radio opaque foreign body appreciated.   Degenerative changes in the interphalangeal joint and first metatarsal phalangeal joint  Moderate inferior calcaneal spur    Impression   1. Degenerative changes in the interphalangeal joint and first metatarsal phalangeal joint 2. Moderate inferior calcaneal spur 3. Subcutaneous emphysema of the dorsum of the foot with soft tissue swelling  Electronically signed by: Ozell MICAEL Potters, M. D. on 07/05/2024 16:04:35       Procedures MDM         Assessment/Plan Diagnoses and all orders for this visit:  Dog bite of left foot, subsequent encounter  Left foot pain -     XR foot 3+ views left  Cellulitis of left foot     Disposition Status: Emergency Department  Patient Instructions  Advised the patient that she needs a higher level of care where on demand labs and advanced imaging may be used to form a definitive diagnosis and aid in formulating an appropriate treatment plan.   Progress note signed by Fairy Jenny, PA on 07/05/24 at  4:34 PM

## 2024-07-05 NOTE — Telephone Encounter (Signed)
 Noted

## 2024-07-05 NOTE — Telephone Encounter (Signed)
 Patient called re: stitches removal needed for dog bite Recommended be removed in 7-10days Stitches received on 9.19.25  No appointments here, offered [facility], patient did not want to go that far  Referred patient to urgent care

## 2024-07-06 ENCOUNTER — Encounter (HOSPITAL_COMMUNITY): Payer: Self-pay

## 2024-07-06 ENCOUNTER — Inpatient Hospital Stay (HOSPITAL_COMMUNITY)
Admission: EM | Admit: 2024-07-06 | Discharge: 2024-07-13 | DRG: 271 | Disposition: A | Discharge status: Home / Self Care | Attending: Internal Medicine | Admitting: Internal Medicine

## 2024-07-06 ENCOUNTER — Other Ambulatory Visit: Payer: Self-pay

## 2024-07-06 DIAGNOSIS — I7409 Other arterial embolism and thrombosis of abdominal aorta: Secondary | ICD-10-CM | POA: Diagnosis present

## 2024-07-06 DIAGNOSIS — N183 Chronic kidney disease, stage 3 unspecified: Secondary | ICD-10-CM | POA: Diagnosis not present

## 2024-07-06 DIAGNOSIS — L03116 Cellulitis of left lower limb: Secondary | ICD-10-CM | POA: Diagnosis not present

## 2024-07-06 DIAGNOSIS — W540XXA Bitten by dog, initial encounter: Secondary | ICD-10-CM | POA: Diagnosis not present

## 2024-07-06 DIAGNOSIS — Z9842 Cataract extraction status, left eye: Secondary | ICD-10-CM

## 2024-07-06 DIAGNOSIS — E860 Dehydration: Secondary | ICD-10-CM | POA: Diagnosis present

## 2024-07-06 DIAGNOSIS — N184 Chronic kidney disease, stage 4 (severe): Secondary | ICD-10-CM | POA: Diagnosis present

## 2024-07-06 DIAGNOSIS — I700 Atherosclerosis of aorta: Secondary | ICD-10-CM | POA: Diagnosis not present

## 2024-07-06 DIAGNOSIS — S91059S Open bite, unspecified ankle, sequela: Secondary | ICD-10-CM

## 2024-07-06 DIAGNOSIS — Z8249 Family history of ischemic heart disease and other diseases of the circulatory system: Secondary | ICD-10-CM | POA: Diagnosis not present

## 2024-07-06 DIAGNOSIS — Z79899 Other long term (current) drug therapy: Secondary | ICD-10-CM

## 2024-07-06 DIAGNOSIS — Z95828 Presence of other vascular implants and grafts: Secondary | ICD-10-CM | POA: Diagnosis not present

## 2024-07-06 DIAGNOSIS — I739 Peripheral vascular disease, unspecified: Secondary | ICD-10-CM

## 2024-07-06 DIAGNOSIS — I70262 Atherosclerosis of native arteries of extremities with gangrene, left leg: Principal | ICD-10-CM | POA: Diagnosis present

## 2024-07-06 DIAGNOSIS — Z96643 Presence of artificial hip joint, bilateral: Secondary | ICD-10-CM | POA: Diagnosis present

## 2024-07-06 DIAGNOSIS — E785 Hyperlipidemia, unspecified: Secondary | ICD-10-CM | POA: Diagnosis present

## 2024-07-06 DIAGNOSIS — D62 Acute posthemorrhagic anemia: Secondary | ICD-10-CM | POA: Diagnosis not present

## 2024-07-06 DIAGNOSIS — S91052A Open bite, left ankle, initial encounter: Secondary | ICD-10-CM | POA: Diagnosis present

## 2024-07-06 DIAGNOSIS — Z7982 Long term (current) use of aspirin: Secondary | ICD-10-CM

## 2024-07-06 DIAGNOSIS — Z87891 Personal history of nicotine dependence: Secondary | ICD-10-CM

## 2024-07-06 DIAGNOSIS — S91052D Open bite, left ankle, subsequent encounter: Secondary | ICD-10-CM | POA: Diagnosis not present

## 2024-07-06 DIAGNOSIS — Z9841 Cataract extraction status, right eye: Secondary | ICD-10-CM | POA: Diagnosis not present

## 2024-07-06 DIAGNOSIS — L97529 Non-pressure chronic ulcer of other part of left foot with unspecified severity: Secondary | ICD-10-CM | POA: Diagnosis present

## 2024-07-06 DIAGNOSIS — Z888 Allergy status to other drugs, medicaments and biological substances status: Secondary | ICD-10-CM | POA: Diagnosis not present

## 2024-07-06 DIAGNOSIS — D631 Anemia in chronic kidney disease: Secondary | ICD-10-CM | POA: Diagnosis present

## 2024-07-06 DIAGNOSIS — W540XXD Bitten by dog, subsequent encounter: Secondary | ICD-10-CM | POA: Diagnosis not present

## 2024-07-06 DIAGNOSIS — Z4800 Encounter for change or removal of nonsurgical wound dressing: Secondary | ICD-10-CM | POA: Diagnosis present

## 2024-07-06 DIAGNOSIS — W540XXS Bitten by dog, sequela: Secondary | ICD-10-CM | POA: Diagnosis not present

## 2024-07-06 DIAGNOSIS — I129 Hypertensive chronic kidney disease with stage 1 through stage 4 chronic kidney disease, or unspecified chronic kidney disease: Secondary | ICD-10-CM | POA: Diagnosis present

## 2024-07-06 DIAGNOSIS — E871 Hypo-osmolality and hyponatremia: Secondary | ICD-10-CM | POA: Diagnosis present

## 2024-07-06 DIAGNOSIS — M6289 Other specified disorders of muscle: Secondary | ICD-10-CM | POA: Diagnosis not present

## 2024-07-06 DIAGNOSIS — I70222 Atherosclerosis of native arteries of extremities with rest pain, left leg: Secondary | ICD-10-CM | POA: Diagnosis not present

## 2024-07-06 DIAGNOSIS — I96 Gangrene, not elsewhere classified: Secondary | ICD-10-CM

## 2024-07-06 DIAGNOSIS — S91059D Open bite, unspecified ankle, subsequent encounter: Secondary | ICD-10-CM | POA: Diagnosis not present

## 2024-07-06 DIAGNOSIS — Z9889 Other specified postprocedural states: Secondary | ICD-10-CM | POA: Diagnosis not present

## 2024-07-06 DIAGNOSIS — I70245 Atherosclerosis of native arteries of left leg with ulceration of other part of foot: Secondary | ICD-10-CM | POA: Diagnosis not present

## 2024-07-06 DIAGNOSIS — I7090 Unspecified atherosclerosis: Secondary | ICD-10-CM | POA: Diagnosis not present

## 2024-07-06 DIAGNOSIS — I701 Atherosclerosis of renal artery: Secondary | ICD-10-CM | POA: Diagnosis not present

## 2024-07-06 LAB — CBC WITH DIFFERENTIAL/PLATELET
Abs Immature Granulocytes: 0.06 K/uL (ref 0.00–0.07)
Basophils Absolute: 0.1 K/uL (ref 0.0–0.1)
Basophils Relative: 1 %
Eosinophils Absolute: 0.4 K/uL (ref 0.0–0.5)
Eosinophils Relative: 4 %
HCT: 28.2 % — ABNORMAL LOW (ref 36.0–46.0)
Hemoglobin: 9.2 g/dL — ABNORMAL LOW (ref 12.0–15.0)
Immature Granulocytes: 1 %
Lymphocytes Relative: 12 %
Lymphs Abs: 1.4 K/uL (ref 0.7–4.0)
MCH: 32.2 pg (ref 26.0–34.0)
MCHC: 32.6 g/dL (ref 30.0–36.0)
MCV: 98.6 fL (ref 80.0–100.0)
Monocytes Absolute: 1.2 K/uL — ABNORMAL HIGH (ref 0.1–1.0)
Monocytes Relative: 10 %
Neutro Abs: 8.6 K/uL — ABNORMAL HIGH (ref 1.7–7.7)
Neutrophils Relative %: 72 %
Platelets: 354 K/uL (ref 150–400)
RBC: 2.86 MIL/uL — ABNORMAL LOW (ref 3.87–5.11)
RDW: 13 % (ref 11.5–15.5)
WBC: 11.8 K/uL — ABNORMAL HIGH (ref 4.0–10.5)
nRBC: 0 % (ref 0.0–0.2)

## 2024-07-06 LAB — COMPREHENSIVE METABOLIC PANEL WITH GFR
ALT: 13 U/L (ref 0–44)
AST: 17 U/L (ref 15–41)
Albumin: 3.1 g/dL — ABNORMAL LOW (ref 3.5–5.0)
Alkaline Phosphatase: 53 U/L (ref 38–126)
Anion gap: 10 (ref 5–15)
BUN: 18 mg/dL (ref 8–23)
CO2: 23 mmol/L (ref 22–32)
Calcium: 8.8 mg/dL — ABNORMAL LOW (ref 8.9–10.3)
Chloride: 100 mmol/L (ref 98–111)
Creatinine, Ser: 1.57 mg/dL — ABNORMAL HIGH (ref 0.44–1.00)
GFR, Estimated: 34 mL/min — ABNORMAL LOW (ref >60)
Glucose, Bld: 103 mg/dL — ABNORMAL HIGH (ref 70–99)
Potassium: 4.7 mmol/L (ref 3.5–5.1)
Sodium: 133 mmol/L — ABNORMAL LOW (ref 135–145)
Total Bilirubin: 0.5 mg/dL (ref 0.0–1.2)
Total Protein: 6.1 g/dL — ABNORMAL LOW (ref 6.5–8.1)

## 2024-07-06 LAB — CBC
HCT: 31 % — ABNORMAL LOW (ref 36.0–46.0)
Hemoglobin: 10.1 g/dL — ABNORMAL LOW (ref 12.0–15.0)
MCH: 31.7 pg (ref 26.0–34.0)
MCHC: 32.6 g/dL (ref 30.0–36.0)
MCV: 97.2 fL (ref 80.0–100.0)
Platelets: 387 K/uL (ref 150–400)
RBC: 3.19 MIL/uL — ABNORMAL LOW (ref 3.87–5.11)
RDW: 12.9 % (ref 11.5–15.5)
WBC: 12 K/uL — ABNORMAL HIGH (ref 4.0–10.5)
nRBC: 0 % (ref 0.0–0.2)

## 2024-07-06 LAB — TYPE AND SCREEN
ABO/RH(D): O POS
Antibody Screen: NEGATIVE

## 2024-07-06 LAB — CREATININE, SERUM
Creatinine, Ser: 1.47 mg/dL — ABNORMAL HIGH (ref 0.44–1.00)
GFR, Estimated: 37 mL/min — ABNORMAL LOW (ref >60)

## 2024-07-06 LAB — I-STAT CG4 LACTIC ACID, ED: Lactic Acid, Venous: 0.9 mmol/L (ref 0.5–1.9)

## 2024-07-06 LAB — PROCALCITONIN: Procalcitonin: <0.1 ng/mL

## 2024-07-06 LAB — C-REACTIVE PROTEIN: CRP: 0.6 mg/dL (ref <1.0)

## 2024-07-06 MED ORDER — METOPROLOL SUCCINATE ER 100 MG PO TB24
200.0000 mg | ORAL_TABLET | Freq: Every day | ORAL | Status: DC
Start: 2024-07-07 — End: 2024-07-13
  Administered 2024-07-07 – 2024-07-13 (×6): 200 mg via ORAL
  Filled 2024-07-06: qty 2
  Filled 2024-07-06 (×2): qty 8
  Filled 2024-07-06 (×3): qty 2
  Filled 2024-07-06: qty 8

## 2024-07-06 MED ORDER — HYDRALAZINE HCL 20 MG/ML IJ SOLN
10.0000 mg | Freq: Four times a day (QID) | INTRAMUSCULAR | Status: DC | PRN
  Administered 2024-07-07: 10 mg via INTRAVENOUS
  Filled 2024-07-06: qty 1

## 2024-07-06 MED ORDER — ONDANSETRON HCL 4 MG PO TABS: 4.0000 mg | ORAL_TABLET | Freq: Four times a day (QID) | ORAL | Status: DC | PRN

## 2024-07-06 MED ORDER — SENNOSIDES-DOCUSATE SODIUM 8.6-50 MG PO TABS: 1.0000 | ORAL_TABLET | Freq: Every evening | ORAL | Status: DC | PRN

## 2024-07-06 MED ORDER — VANCOMYCIN HCL 1250 MG/250ML IV SOLN
1250.0000 mg | Freq: Once | INTRAVENOUS | Status: AC
  Administered 2024-07-06: 1250 mg via INTRAVENOUS
  Filled 2024-07-06 (×2): qty 250

## 2024-07-06 MED ORDER — AMLODIPINE BESYLATE 10 MG PO TABS
10.0000 mg | ORAL_TABLET | Freq: Every day | ORAL | Status: DC
  Administered 2024-07-07 – 2024-07-13 (×5): 10 mg via ORAL
  Filled 2024-07-06: qty 2
  Filled 2024-07-06 (×2): qty 1
  Filled 2024-07-06 (×2): qty 2
  Filled 2024-07-06: qty 1

## 2024-07-06 MED ORDER — TRAMADOL HCL 50 MG PO TABS
50.0000 mg | ORAL_TABLET | Freq: Two times a day (BID) | ORAL | Status: DC | PRN
  Administered 2024-07-06 – 2024-07-12 (×6): 50 mg via ORAL
  Filled 2024-07-06 (×9): qty 1

## 2024-07-06 MED ORDER — ONDANSETRON HCL 4 MG/2ML IJ SOLN
4.0000 mg | Freq: Four times a day (QID) | INTRAMUSCULAR | Status: DC | PRN
  Administered 2024-07-09: 4 mg via INTRAVENOUS

## 2024-07-06 MED ORDER — LACTATED RINGERS IV SOLN: INTRAVENOUS | Status: AC

## 2024-07-06 MED ORDER — ASPIRIN 81 MG PO TBEC
81.0000 mg | DELAYED_RELEASE_TABLET | Freq: Every day | ORAL | Status: DC
  Administered 2024-07-07 – 2024-07-08 (×2): 81 mg via ORAL
  Filled 2024-07-06 (×4): qty 1

## 2024-07-06 MED ORDER — POLYVINYL ALCOHOL 1.4 % OP SOLN: 2.0000 [drp] | Freq: Two times a day (BID) | OPHTHALMIC | Status: DC | PRN

## 2024-07-06 MED ORDER — ACETAMINOPHEN 650 MG RE SUPP: 650.0000 mg | Freq: Four times a day (QID) | RECTAL | Status: DC | PRN

## 2024-07-06 MED ORDER — SODIUM CHLORIDE 0.9 % IV SOLN: 1.0000 g | Freq: Once | INTRAVENOUS | Status: DC

## 2024-07-06 MED ORDER — SODIUM CHLORIDE 0.9 % IV SOLN
500.0000 mg | Freq: Two times a day (BID) | INTRAVENOUS | Status: DC
  Administered 2024-07-06 – 2024-07-07 (×2): 500 mg via INTRAVENOUS
  Filled 2024-07-06 (×8): qty 10

## 2024-07-06 MED ORDER — HEPARIN SODIUM (PORCINE) 5000 UNIT/ML IJ SOLN
5000.0000 [IU] | Freq: Three times a day (TID) | INTRAMUSCULAR | Status: DC
  Administered 2024-07-06 – 2024-07-12 (×16): 5000 [IU] via SUBCUTANEOUS
  Filled 2024-07-06 (×17): qty 1

## 2024-07-06 MED ORDER — ACETAMINOPHEN 325 MG PO TABS
650.0000 mg | ORAL_TABLET | Freq: Four times a day (QID) | ORAL | Status: DC | PRN
  Administered 2024-07-06 – 2024-07-08 (×2): 650 mg via ORAL
  Filled 2024-07-06 (×2): qty 2

## 2024-07-06 NOTE — ED Triage Notes (Signed)
 Pt states she feels like foot is becoming more swollen and painful. Pt went to UC to have stitches removed and provider would not take them out d/t condition of area. Redness and swelling noted to left foot.

## 2024-07-06 NOTE — ED Notes (Addendum)
 Resting comfortably. Lab at [facility]. Suture removal kit, doppler and suture cart at [facility].

## 2024-07-06 NOTE — H&P (Addendum)
 [facility] H&P   Patient Demographics:    Taylor Bush, is a 78 y.o. female  MRN: [id number]   DOB - [birth year]  Admit Date - 07/06/2024  Outpatient Primary MD for the patient is Copland, Jacques, MD  Patient coming from: Home  Chief Complaint  Patient presents with   Animal Bite      HPI:    Taylor Bush  is a 78 y.o. female, with history of hypertension, dyslipidemia, PAD, CKD stage IIIb, B12 deficiency who was in her usual state of health until few days ago when her pet dog bit her on the anterior side of her left foot, she subsequently came to the ER where the wound was sutured, she received her tetanus shot and was placed on Augmentin  and sent home.  She comes back today for a wound check but the wound has not healed well, there is mild surrounding cellulitis, no fever or chills, no headache or other subjective complaints.  I was called to admit the patient for a infected dog bite which failed Augmentin .  She currently is laying comfortably in bed denies any fever chills, no headache, no chest pain palpitations cough or shortness of breath, no abdominal pain or discomfort, no diarrhea or dysuria, no blood in stool or urine, no focal weakness    Review of systems:    A full 10 point Review of Systems was done, except as stated above, all other Review of Systems were negative.   With Past History of the following :    Past Medical History:  Diagnosis Date   Allergic rhinitis    Allergy    Anemia in chronic kidney disease (CKD)    Arthritis    B12 deficiency 09/13/2021   Blood transfusion 2012   and 2013   Blood transfusion without reported diagnosis    Cataract    bilateral sx   Chronic low back pain     with radiculopathy   CKD (chronic kidney disease) stage 3, GFR 30-59 ml/min (HCC) 11/29/2013   HLD (hyperlipidemia)    HTN (hypertension)    Hx of total hip arthroplasty 12/15/2011   Open-angle glaucoma    PAD (peripheral artery disease) (HCC)    with intermittent claudication, ABI 0.72 on Rt and 0.73 on L when assessed in 2010.       Past  Surgical History:  Procedure Laterality Date   abnormal vascular studies  05/07/2009   ABI's 70, R SFA > 50%^ blockage    CATARACT EXTRACTION, BILATERAL  2011   EXCISION OF TONGUE LESION WITH LASER N/A 08/31/2015   Procedure: BIOPSY AND CO2 LASER OF TONGUE LESION ;  Surgeon: Ida Loader, MD;  Location: [facility];  Service: ENT;  Laterality: N/A;   EXCISION OF TONGUE LESION WITH LASER Left 06/29/2020   Procedure: EXCISION OF TONGUE LESION/CO2 laser w/biopsy;  Surgeon: Loader Ida, MD;  Location: [facility];  Service: ENT;  Laterality: Left;   EXCISION OF TONGUE LESION WITH LASER Bilateral 10/02/2023   Procedure: ORAL BIOPSY AND CO2 LASER ABLATATION OF TONGUE LESION;  Surgeon: Loader Ida, MD;  Location: [facility];  Service: ENT;  Laterality: Bilateral;   EXCISION ORAL LESION WITH CO2 LASER N/A 12/08/2021   Procedure: EXCISION OF ORAL ORAL CAVITY LESION WITH CO2 LASER;  Surgeon: Loader Ida, MD;  Location: [facility];  Service: ENT;  Laterality: N/A;   EYE SURGERY     GLOSSECTOMY, PARTIAL Left 06/17/2024   Procedure: GLOSSECTOMY, PARTIAL;  Surgeon: Loader Ida, MD;  Location: [facility] OR;  Service: ENT;  Laterality: Left;   JOINT REPLACEMENT     OTHER SURGICAL HISTORY     ganglion cyst removed left wrist    TOTAL HIP ARTHROPLASTY  10/03/2011   Procedure: TOTAL HIP ARTHROPLASTY;  Surgeon: Dempsey GAILS Aluisio;  Location: [facility];  Service: Orthopedics;  Laterality: Right;   TOTAL HIP ARTHROPLASTY  12/21/2011   Procedure: TOTAL HIP ARTHROPLASTY;  Surgeon: Dempsey GAILS Moan, MD;  Location: [facility];  Service: Orthopedics;   Laterality: Left;   TUBAL LIGATION        Social History:     Social History   Tobacco Use   Smoking status: Former    Current packs/day: 0.00    Types: Cigarettes    Quit date: 11/07/2008    Years since quitting: 15.6   Smokeless tobacco: Never  Substance Use Topics   Alcohol  use: No         Family History :     Family History  Problem Relation Age of Onset   Colon polyps Mother 86   Heart attack Father        66s   Stroke Neg Hx        also no PAD or aneurysm   Colon cancer Neg Hx    Esophageal cancer Neg Hx    Stomach cancer Neg Hx    Rectal cancer Neg Hx        Home Medications:   Prior to Admission medications   Medication Sig Start Date End Date Taking? Authorizing Provider  amLODipine  (NORVASC ) 10 MG tablet TAKE 1 TABLET BY MOUTH DAILY 11/19/23   Copland, Jacques, MD  amoxicillin -clavulanate (AUGMENTIN ) 875-125 MG tablet Take 1 tablet by mouth every 12 (twelve) hours. 06/26/24   Jarold Olam HERO, PA-C  aspirin  81 MG tablet Take 81 mg by mouth daily.    [provider]  carboxymethylcellulose (REFRESH PLUS) 0.5 % SOLN 1 drop 3 (three) times daily as needed (dry/irritated eyes.). Use when get injection in eye    [provider]  Faricimab -svoa (VABYSMO  IZ) 1 Dose by Intravitreal route every 6 (six) weeks.    [provider]  lisinopril  (ZESTRIL ) 20 MG tablet TAKE 1 TABLET BY MOUTH DAILY 11/19/23   Copland, Jacques, MD  metoprolol  (TOPROL -XL) 200 MG 24 hr tablet  TAKE 1 TABLET BY MOUTH DAILY 11/19/23   Copland, Jacques, MD  ondansetron  (ZOFRAN -ODT) 4 MG disintegrating tablet Take 1 tablet (4 mg total) by mouth every 8 (eight) hours as needed for nausea or vomiting. 06/18/24   Jesus Oliphant, MD  traMADol  (ULTRAM ) 50 MG tablet Take 1 tablet (50 mg total) by mouth every 12 (twelve) hours as needed. 02/15/24   Watt Jacques, MD     Allergies:     Allergies  Allergen Reactions   Prednisone Other (See Comments)    Joint pain,joint  stiffness   Statins Rash    hands broke out     Physical Exam:   Vitals  Blood pressure (!) 177/68, pulse 62, temperature 98.6 F (37 C), resp. rate 16, weight 67.1 kg, SpO2 100%.   1. General Elderly Caucasian female sitting up in hospital bed in no discomfort  2. Normal affect and insight, Not Suicidal or Homicidal, Awake Alert,   3. No F.N deficits, ALL C.Nerves Intact, Strength 5/5 all 4 extremities, Sensation intact all 4 extremities, Plantars down going.  4. Ears and Eyes appear Normal, Conjunctivae clear, PERRLA. Moist Oral Mucosa.  5. Supple Neck, No JVD, No cervical lymphadenopathy appriciated, No Carotid Bruits.  6. Symmetrical Chest wall movement, Good air movement bilaterally, CTAB.  7. RRR, No Gallops, Rubs or Murmurs, No Parasternal Heave.  8. Positive Bowel Sounds, Abdomen Soft, No tenderness, No organomegaly appriciated,No rebound -guarding or rigidity.  9.  No Cyanosis, Normal Skin Turgor, No Skin Rash or Bruise.  10. Good muscle tone,  joints appear normal , no effusions, Normal ROM.  11. No Palpable Lymph Nodes in Neck or Axillae       Data Review:   Recent Labs  Lab 07/06/24 0939  WBC 11.8*  HGB 9.2*  HCT 28.2*  PLT 354  MCV 98.6  MCH 32.2  MCHC 32.6  RDW 13.0  LYMPHSABS 1.4  MONOABS 1.2*  EOSABS 0.4  BASOSABS 0.1    Recent Labs  Lab 07/06/24 0939 07/06/24 0944  NA 133*  --   K 4.7  --   CL 100  --   CO2 23  --   ANIONGAP 10  --   GLUCOSE 103*  --   BUN 18  --   CREATININE 1.57*  --   AST 17  --   ALT 13  --   ALKPHOS 53  --   BILITOT 0.5  --   ALBUMIN 3.1*  --   LATICACIDVEN  --  0.9  CALCIUM  8.8*  --     Lab Results  Component Value Date   CHOL 204 (H) 09/14/2023   HDL 57.80 09/14/2023   LDLCALC 118 (H) 09/14/2023   LDLDIRECT 163.0 09/13/2021   TRIG 137.0 09/14/2023   CHOLHDL 4 09/14/2023    Recent Labs  Lab 07/06/24 0939 07/06/24 0944  LATICACIDVEN  --  0.9  CALCIUM  8.8*  --     Recent Labs  Lab  07/06/24 0939 07/06/24 0944  WBC 11.8*  --   PLT 354  --   LATICACIDVEN  --  0.9  CREATININE 1.57*  --     Urinalysis    Component Value Date/Time   COLORURINE YELLOW 12/19/2011 1144   APPEARANCEUR CLEAR 12/19/2011 1144   LABSPEC 1.020 12/19/2011 1144   PHURINE 5.0 12/19/2011 1144   GLUCOSEU NEGATIVE 12/19/2011 1144   HGBUR NEGATIVE 12/19/2011 1144   BILIRUBINUR NEGATIVE 12/19/2011 1144   KETONESUR NEGATIVE 12/19/2011 1144   PROTEINUR NEGATIVE 12/19/2011  1144   UROBILINOGEN 0.2 12/19/2011 1144   NITRITE NEGATIVE 12/19/2011 1144   LEUKOCYTESUR SMALL (A) 12/19/2011 1144      Imaging Results:    No results found.      Assessment & Plan:    1.  Left foot infected dog bite failed Augmentin .  She will be admitted to the hospital, blood cultures will be drawn, will be placed on meropenem  as she has failed Augmentin , since she has PAD will obtain ABI, continue local wound care, Dr. Harden has been consulted as she may require debridement. Type screen done, agreeable for transfusion if needed. Note patient received tetanus shot last ER visit few days ago.  2.  PAD.  Continue home aspirin .  Check ABI.  3.  Hypertension.  Norvasc  and beta-blocker continued, add as needed hydralazine , hold ACE/ARB until surgery is done.  4.  CKD 3B.  Baseline creatinine around 1.6 -1.8.  Gently hydrate, avoid ACE ARB while she is in the peri op period,  Monitor  5.  Mild hyponatremia due to dehydration hydrate.    DVT Prophylaxis Heparin    AM Labs Ordered, also please review Full Orders  Family Communication: Admission, patients condition and plan of care including tests being ordered have been discussed with the patient who indicates understanding and agree with the plan and Code Status.  Code Status full code  Likely DC to to be decided  Condition fair  Consults called: Orthopedics Dr. Harden  Admission status: Inpatient  Time spent in minutes : 35  Signature  -    Lavada Stank  M.D on 07/06/2024 at 11:12 AM   -  To page go to [email or web address]

## 2024-07-06 NOTE — ED Notes (Signed)
 EDP at [facility]

## 2024-07-06 NOTE — ED Notes (Signed)
 EDP at [facility]. Attempted IV x2, unsuccessful. Delay in ABT. Sutures removed by EDP. L anterior foot wound with eschar marked outline of redness, dressed with xeroform, gauze, kling and ACE wrap. Agreeable to admission.

## 2024-07-06 NOTE — Progress Notes (Signed)
 ED Pharmacy Antibiotic Sign Off An antibiotic consult was received from an ED provider for vancomycin  per pharmacy dosing for cellulitis. A chart review was completed to assess appropriateness.  A single dose of ceftriaxone placed by the ED provider.   The following one time order(s) were placed per pharmacy consult:  vancomycin  1250 mg x 1 dose  Further antibiotic and/or antibiotic pharmacy consults should be ordered by the admitting provider if indicated.   Thank you for allowing pharmacy to be a part of this patient's care.   Dorn Buttner, PharmD, BCPS 07/06/2024 11:20 AM ED Clinical Pharmacist -  [phone]

## 2024-07-06 NOTE — ED Triage Notes (Signed)
 Pt presents for concern for L foot infection following dog bite 1 week ago. She was previously seen in ED 8/19 for this.   Endorses L foot pain and swelling. Denies fever.

## 2024-07-06 NOTE — ED Notes (Signed)
 IV team ordered. 5N notified by phone and updated on IV team and pending arrival. No changes. Pt alert, NAD, calm, interactive.

## 2024-07-06 NOTE — Consult Note (Signed)
 ORTHOPAEDIC CONSULTATION  REQUESTING PHYSICIAN: Dennise Lavada POUR, MD  Chief Complaint: Gangrenous ulcer dorsum of the left foot.  HPI: Taylor Bush is a 78 y.o. female who presents with gangrenous ulcer dorsum of the left foot.  Patient states that she was bit by her [place] about 2 weeks ago.  Past Medical History:  Diagnosis Date   Allergic rhinitis    Allergy    Anemia in chronic kidney disease (CKD)    Arthritis    B12 deficiency 09/13/2021   Blood transfusion 2012   and 2013   Blood transfusion without reported diagnosis    Cataract    bilateral sx   Chronic low back pain    with radiculopathy   CKD (chronic kidney disease) stage 3, GFR 30-59 ml/min (HCC) 11/29/2013   HLD (hyperlipidemia)    HTN (hypertension)    Hx of total hip arthroplasty 12/15/2011   Open-angle glaucoma    PAD (peripheral artery disease) (HCC)    with intermittent claudication, ABI 0.72 on Rt and 0.73 on L when assessed in 2010.    Past Surgical History:  Procedure Laterality Date   abnormal vascular studies  05/07/2009   ABI's 70, R SFA > 50%^ blockage    CATARACT EXTRACTION, BILATERAL  2011   EXCISION OF TONGUE LESION WITH LASER N/A 08/31/2015   Procedure: BIOPSY AND CO2 LASER OF TONGUE LESION ;  Surgeon: Ida Loader, MD;  Location: [facility];  Service: ENT;  Laterality: N/A;   EXCISION OF TONGUE LESION WITH LASER Left 06/29/2020   Procedure: EXCISION OF TONGUE LESION/CO2 laser w/biopsy;  Surgeon: Loader Ida, MD;  Location: [facility];  Service: ENT;  Laterality: Left;   EXCISION OF TONGUE LESION WITH LASER Bilateral 10/02/2023   Procedure: ORAL BIOPSY AND CO2 LASER ABLATATION OF TONGUE LESION;  Surgeon: Loader Ida, MD;  Location: [facility];  Service: ENT;  Laterality: Bilateral;   EXCISION ORAL LESION WITH CO2 LASER N/A 12/08/2021   Procedure: EXCISION OF ORAL ORAL CAVITY LESION WITH CO2 LASER;  Surgeon: Loader Ida, MD;  Location: [facility];  Service: ENT;  Laterality: N/A;   EYE SURGERY     GLOSSECTOMY, PARTIAL Left 06/17/2024   Procedure: GLOSSECTOMY, PARTIAL;  Surgeon: Loader Ida, MD;  Location: [facility];  Service: ENT;  Laterality: Left;   JOINT REPLACEMENT     OTHER SURGICAL HISTORY     ganglion cyst removed left wrist    TOTAL HIP ARTHROPLASTY  10/03/2011   Procedure: TOTAL HIP ARTHROPLASTY;  Surgeon: Dempsey GAILS Aluisio;  Location: [facility];  Service: Orthopedics;  Laterality: Right;   TOTAL HIP ARTHROPLASTY  12/21/2011   Procedure: TOTAL HIP ARTHROPLASTY;  Surgeon: Dempsey GAILS Moan, MD;  Location: [facility];  Service: Orthopedics;  Laterality: Left;   TUBAL LIGATION     Social History   Socioeconomic History   Marital status: Widowed    Spouse name: Not on file   Number of children: Not on file   Years of education: Not on file   Highest education level: Not on file  Occupational History   Not on file  Tobacco Use   Smoking status: Former    Current packs/day: 0.00    Types: Cigarettes    Quit date: 11/07/2008    Years since quitting: 15.6   Smokeless tobacco: Never  Vaping Use   Vaping status: Never Used  Substance and Sexual Activity   Alcohol  use: No   Drug  use: No   Sexual activity: Not on file  Other Topics Concern   Not on file  Social History Narrative   Retired Haematologist, 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    Social Drivers of Corporate investment banker Strain: Low Risk  (04/29/2024)   Overall Financial Resource Strain (CARDIA)    Difficulty of Paying Living Expenses: Not hard at all  Food Insecurity: No Food Insecurity (07/06/2024)   Hunger Vital Sign    Worried About Running Out of Food in the Last Year: Never true    Ran Out of Food in the Last Year: Never true  Transportation Needs: No Transportation Needs (07/06/2024)   PRAPARE - Administrator, Civil Service (Medical): No    Lack of Transportation (Non-Medical): No  Physical Activity: Insufficiently Active (04/29/2024)    Exercise Vital Sign    Days of Exercise per Week: 7 days    Minutes of Exercise per Session: 20 min  Stress: No Stress Concern Present (04/29/2024)   [employer] of Occupational Health - Occupational Stress Questionnaire    Feeling of Stress: Not at all  Social Connections: Moderately Isolated (07/06/2024)   Social Connection and Isolation Panel    Frequency of Communication with Friends and Family: More than three times a week    Frequency of Social Gatherings with Friends and Family: More than three times a week    Attends Religious Services: More than 4 times per year    Active Member of [employer] or Organizations: No    Attends Banker Meetings: Never    Marital Status: Widowed   Family History  Problem Relation Age of Onset   Colon polyps Mother 69   Heart attack Father        14s   Stroke Neg Hx        also no PAD or aneurysm   Colon cancer Neg Hx    Esophageal cancer Neg Hx    Stomach cancer Neg Hx    Rectal cancer Neg Hx    - negative except otherwise stated in the family history section Allergies  Allergen Reactions   Prednisone Other (See Comments)    Joint pain,joint stiffness   Statins Rash    hands broke out   Prior to Admission medications   Medication Sig Start Date End Date Taking? Authorizing Provider  amLODipine  (NORVASC ) 10 MG tablet TAKE 1 TABLET BY MOUTH DAILY 11/19/23  Yes Copland, Spencer, MD  aspirin  81 MG tablet Take 81 mg by mouth daily.   Yes [provider]  Faricimab -svoa (VABYSMO  IZ) 1 Dose by Intravitreal route every 6 (six) weeks.   Yes [provider]  JOURNAVX 50 MG TABS Take 50 mg by mouth 2 (two) times daily as needed (Pain). 06/18/24  Yes [provider]  lisinopril  (ZESTRIL ) 20 MG tablet TAKE 1 TABLET BY MOUTH DAILY 11/19/23  Yes Copland, Jacques, MD  metoprolol  (TOPROL -XL) 200 MG 24 hr tablet TAKE 1 TABLET BY MOUTH DAILY 11/19/23  Yes Copland, Jacques, MD  ondansetron  (ZOFRAN -ODT) 4 MG  disintegrating tablet Take 1 tablet (4 mg total) by mouth every 8 (eight) hours as needed for nausea or vomiting. 06/18/24  Yes Jesus Oliphant, MD  traMADol  (ULTRAM ) 50 MG tablet Take 1 tablet (50 mg total) by mouth every 12 (twelve) hours as needed. 02/15/24  Yes Copland, Jacques, MD  amoxicillin -clavulanate (AUGMENTIN ) 875-125 MG tablet Take 1 tablet by mouth every 12 (twelve) hours. Patient not taking: Reported on 07/06/2024  06/26/24   Jarold Olam HERO, PA-C   No results found. - pertinent xrays, CT, MRI studies were reviewed and independently interpreted  Positive ROS: All other systems have been reviewed and were otherwise negative with the exception of those mentioned in the HPI and as above.  Physical Exam: General: Alert, no acute distress Psychiatric: Patient is competent for consent with normal mood and affect Lymphatic: No axillary or cervical lymphadenopathy Cardiovascular: No pedal edema Respiratory: No cyanosis, no use of accessory musculature GI: No organomegaly, abdomen is soft and non-tender    Images:  @ENCIMAGES @  Labs:  Lab Results  Component Value Date   HGBA1C 5.7 09/14/2023   HGBA1C 5.9 09/07/2022   HGBA1C 5.8 05/06/2019   CRP 0.6 07/06/2024    Lab Results  Component Value Date   ALBUMIN 3.1 (L) 07/06/2024   ALBUMIN 3.9 09/14/2023   ALBUMIN 4.1 09/07/2022        Latest Ref Rng & Units 07/06/2024   12:57 PM 07/06/2024    9:39 AM 06/17/2024   11:47 AM  CBC EXTENDED  WBC 4.0 - 10.5 K/uL 12.0  11.8    RBC 3.87 - 5.11 MIL/uL 3.19  2.86    Hemoglobin 12.0 - 15.0 g/dL 89.8  9.2  88.3   HCT 63.9 - 46.0 % 31.0  28.2  34.0   Platelets 150 - 400 K/uL 387  354    NEUT# 1.7 - 7.7 K/uL  8.6    Lymph# 0.7 - 4.0 K/uL  1.4      Neurologic: Patient does not have protective sensation bilateral lower extremities.   MUSCULOSKELETAL:   Skin: Examination patient has a full-thickness gangrenous ulcer dorsum of the left foot approximately 2 cm in diameter.  Patient  does not have a palpable dorsalis pedis or posterior tibial pulse.  There is no ascending cellulitis.  Radiograph shows calcification of the arteries out to the metatarsal heads.  White cell count 12.0 with a hemoglobin of 10.1.    Assessment: Assessment: Gangrenous ulcer dorsum of the left foot.  Plan: Ankle-brachial indices are ordered.  Anticipate patient may need vascular surgery consultation.  Awaiting ABI findings.  Thank you for the consult and the opportunity to see Ms. Daralene Jerona Sage, MD [facility] [phone] 4:15 PM

## 2024-07-06 NOTE — ED Provider Notes (Signed)
 [place] EMERGENCY DEPARTMENT AT [facility] Provider Note   CSN: [id number] Arrival date & time: 07/06/24  9198     Patient presents with: Animal Bite   Taylor Bush is a 78 y.o. female.   78 year old female with a history of peripheral artery disease, hypertension, and hyperlipidemia who presents to the emergency department for left foot wound.  On 8/19 patient was bitten in the foot by her own dog.  Had a 2.5 cm laceration that appeared to be flap type and was gaping so it was closed with nonabsorbable sutures.  She was placed on 7 days of Augmentin .  Continued that but reports that she is having some redness from her foot and that it is more swollen and painful.  Went to urgent care yesterday and had x-rays that were normal.  Referred into the emergency department at that point in time.       Prior to Admission medications   Medication Sig Start Date End Date Taking? Authorizing Provider  amLODipine  (NORVASC ) 10 MG tablet TAKE 1 TABLET BY MOUTH DAILY 11/19/23  Yes Copland, Jacques, MD  aspirin  81 MG tablet Take 81 mg by mouth daily.   Yes [provider]  Faricimab -svoa (VABYSMO  IZ) 1 Dose by Intravitreal route every 6 (six) weeks.   Yes [provider]  JOURNAVX 50 MG TABS Take 50 mg by mouth 2 (two) times daily as needed (Pain). 06/18/24  Yes [provider]  lisinopril  (ZESTRIL ) 20 MG tablet TAKE 1 TABLET BY MOUTH DAILY 11/19/23  Yes Copland, Jacques, MD  metoprolol  (TOPROL -XL) 200 MG 24 hr tablet TAKE 1 TABLET BY MOUTH DAILY 11/19/23  Yes Copland, Jacques, MD  ondansetron  (ZOFRAN -ODT) 4 MG disintegrating tablet Take 1 tablet (4 mg total) by mouth every 8 (eight) hours as needed for nausea or vomiting. 06/18/24  Yes Jesus Oliphant, MD  traMADol  (ULTRAM ) 50 MG tablet Take 1 tablet (50 mg total) by mouth every 12 (twelve) hours as needed. 02/15/24  Yes Copland, Jacques, MD  amoxicillin -clavulanate (AUGMENTIN ) 875-125 MG tablet Take 1 tablet by mouth  every 12 (twelve) hours. Patient not taking: Reported on 07/06/2024 06/26/24   Jarold Olam CHRISTELLA, PA-C    Allergies: Prednisone and Statins    Review of Systems  Updated Vital Signs BP (!) 163/61 (BP Location: Right Arm)   Pulse 63   Temp 97.7 F (36.5 C) (Oral)   Resp 16   Wt 67.1 kg   SpO2 99%   BMI 28.89 kg/m   Physical Exam Musculoskeletal:     Comments: Left foot appears warm and well-perfused.  Area of erythema seen below.  Does have nonabsorbable sutures that are still in place.  No subcutaneous emphysema or crepitance.  Dopplerable DP pulse in the left foot.  Cap refill 3 seconds in all digits of the left foot.     L foot:    (all labs ordered are listed, but only abnormal results are displayed) Labs Reviewed  COMPREHENSIVE METABOLIC PANEL WITH GFR - Abnormal; Notable for the following components:      Result Value   Sodium 133 (*)    Glucose, Bld 103 (*)    Creatinine, Ser 1.57 (*)    Calcium  8.8 (*)    Total Protein 6.1 (*)    Albumin 3.1 (*)    GFR, Estimated 34 (*)    All other components within normal limits  CBC WITH DIFFERENTIAL/PLATELET - Abnormal; Notable for the following components:   WBC 11.8 (*)  RBC 2.86 (*)    Hemoglobin 9.2 (*)    HCT 28.2 (*)    Neutro Abs 8.6 (*)    Monocytes Absolute 1.2 (*)    All other components within normal limits  CBC - Abnormal; Notable for the following components:   WBC 12.0 (*)    RBC 3.19 (*)    Hemoglobin 10.1 (*)    HCT 31.0 (*)    All other components within normal limits  CREATININE, SERUM - Abnormal; Notable for the following components:   Creatinine, Ser 1.47 (*)    GFR, Estimated 37 (*)    All other components within normal limits  CULTURE, BLOOD (ROUTINE X 2)  C-REACTIVE PROTEIN  PROCALCITONIN  PROTIME-INR  MAGNESIUM   CBC WITH DIFFERENTIAL/PLATELET  BASIC METABOLIC PANEL WITH GFR  PROCALCITONIN  C-REACTIVE PROTEIN  I-STAT CG4 LACTIC ACID, ED  TYPE AND SCREEN     EKG: None  Radiology: No results found.   Procedures   Medications Ordered in the ED  lactated ringers  infusion ( Intravenous New Bag/Given 07/06/24 1820)  amLODipine  (NORVASC ) tablet 10 mg (has no administration in time range)  aspirin  EC tablet 81 mg (has no administration in time range)  artificial tears ophthalmic solution 2 drop (has no administration in time range)  metoprolol  succinate (TOPROL -XL) 24 hr tablet 200 mg (has no administration in time range)  traMADol  (ULTRAM ) tablet 50 mg (50 mg Oral Given 07/06/24 1810)  hydrALAZINE  (APRESOLINE ) injection 10 mg (has no administration in time range)  heparin  injection 5,000 Units (5,000 Units Subcutaneous Given 07/06/24 1420)  acetaminophen  (TYLENOL ) tablet 650 mg (has no administration in time range)    Or  acetaminophen  (TYLENOL ) suppository 650 mg (has no administration in time range)  senna-docusate (Senokot-S) tablet 1 tablet (has no administration in time range)  ondansetron  (ZOFRAN ) tablet 4 mg (has no administration in time range)    Or  ondansetron  (ZOFRAN ) injection 4 mg (has no administration in time range)  meropenem  (MERREM ) 500 mg in sodium chloride  0.9 % 100 mL IVPB (500 mg Intravenous New Bag/Given 07/06/24 1814)  vancomycin  (VANCOREADY) IVPB 1250 mg/250 mL (0 mg Intravenous Stopped 07/06/24 1700)    Clinical Course as of 07/06/24 1856  Sat Jul 06, 2024  1031 Creatinine(!): 1.57 At baseline [RP]    Clinical Course User Index [RP] Yolande Lamar BROCKS, MD                                 Medical Decision Making Amount and/or Complexity of Data Reviewed Labs: ordered. Decision-making details documented in ED Course.  Risk Decision regarding hospitalization.   78 year old female with a history of peripheral artery disease who presents to the emergency department with redness and pain on the wound of her left foot  Initial Ddx:  Cellulitis, sepsis, necrotizing fasciitis, peripheral artery disease, limb  ischemia  MDM/Course:  Patient presents to the emergency department with a wound to her left foot.  Reports that it is getting more red and painful.  Initiated from a dog bite.  She had stitches that which was to be removed in urgent care but they referred her to the emergency department.  She is not septic appearing.  Wound is not consistent with nec Fasc.  See the images above for the appearance of the wound but it appears that she has cellulitis.  Is at risk for poor wound healing with her history of peripheral artery disease.  Was given IV antibiotics and admitted to the hospital for further management and wound care.  This patient presents to the ED for concern of complaints listed in HPI, this involves an extensive number of treatment options, and is a complaint that carries with it a high risk of complications and morbidity. Disposition including potential need for admission considered.   Dispo: Admit to Floor  Records reviewed ED Visit Notes The following labs were independently interpreted: Chemistry and show CKD I have reviewed the patients home medications and made adjustments as needed Consults: Hospitalist Social Determinants of health:  Geriatric  Portions of this note were generated with Scientist, clinical (histocompatibility and immunogenetics). Dictation errors may occur despite best attempts at proofreading.     Final diagnoses:  Dog bite, subsequent encounter  Cellulitis of left lower extremity  Peripheral arterial disease [facility])    ED Discharge Orders     None          Yolande Lamar BROCKS, MD 07/06/24 [phone]

## 2024-07-06 NOTE — Progress Notes (Signed)
 Pharmacy Antibiotic Note  Taylor Bush is a 78 y.o. female for which pharmacy has been consulted for meropenem  dosing for dog bite that failed augmentin .  SCr 1.57 - near baseline WBC 11.8; LA 0.9; T 98.6; HR 62; RR 16  Vancomycin  x 1 in ED  Plan: Meropenem  500mg  q12h Monitor WBC, fever, renal function, cultures De-escalate when able  Weight: 67.1 kg (147 lb 14.9 oz)  Temp (24hrs), Avg:98.6 F (37 C), Min:98.6 F (37 C), Max:98.6 F (37 C)  Recent Labs  Lab 07/06/24 0939 07/06/24 0944  WBC 11.8*  --   CREATININE 1.57*  --   LATICACIDVEN  --  0.9    Estimated Creatinine Clearance: 25.6 mL/min (A) (by C-G formula based on SCr of 1.57 mg/dL (H)).    Allergies  Allergen Reactions   Prednisone Other (See Comments)    Joint pain,joint stiffness   Statins Rash    hands broke out   Microbiology results: Pending  Thank you for allowing pharmacy to be a part of this patient's care.  Dorn Buttner, PharmD, BCPS 07/06/2024 11:57 AM ED Clinical Pharmacist -  [phone]

## 2024-07-07 ENCOUNTER — Inpatient Hospital Stay (HOSPITAL_COMMUNITY)

## 2024-07-07 DIAGNOSIS — W540XXS Bitten by dog, sequela: Secondary | ICD-10-CM | POA: Diagnosis not present

## 2024-07-07 DIAGNOSIS — S91059D Open bite, unspecified ankle, subsequent encounter: Secondary | ICD-10-CM

## 2024-07-07 DIAGNOSIS — I7090 Unspecified atherosclerosis: Secondary | ICD-10-CM

## 2024-07-07 DIAGNOSIS — S91059S Open bite, unspecified ankle, sequela: Secondary | ICD-10-CM | POA: Diagnosis not present

## 2024-07-07 LAB — CBC WITH DIFFERENTIAL/PLATELET
Abs Immature Granulocytes: 0.05 K/uL (ref 0.00–0.07)
Basophils Absolute: 0.1 K/uL (ref 0.0–0.1)
Basophils Relative: 1 %
Eosinophils Absolute: 0.4 K/uL (ref 0.0–0.5)
Eosinophils Relative: 4 %
HCT: 25.9 % — ABNORMAL LOW (ref 36.0–46.0)
Hemoglobin: 8.4 g/dL — ABNORMAL LOW (ref 12.0–15.0)
Immature Granulocytes: 1 %
Lymphocytes Relative: 23 %
Lymphs Abs: 2.1 K/uL (ref 0.7–4.0)
MCH: 31.7 pg (ref 26.0–34.0)
MCHC: 32.4 g/dL (ref 30.0–36.0)
MCV: 97.7 fL (ref 80.0–100.0)
Monocytes Absolute: 1 K/uL (ref 0.1–1.0)
Monocytes Relative: 11 %
Neutro Abs: 5.5 K/uL (ref 1.7–7.7)
Neutrophils Relative %: 60 %
Platelets: 319 K/uL (ref 150–400)
RBC: 2.65 MIL/uL — ABNORMAL LOW (ref 3.87–5.11)
RDW: 13.2 % (ref 11.5–15.5)
WBC: 9.2 K/uL (ref 4.0–10.5)
nRBC: 0 % (ref 0.0–0.2)

## 2024-07-07 LAB — MAGNESIUM: Magnesium: 1.6 mg/dL — ABNORMAL LOW (ref 1.7–2.4)

## 2024-07-07 LAB — PROTIME-INR
INR: 0.9 (ref 0.8–1.2)
Prothrombin Time: 13.2 s (ref 11.4–15.2)

## 2024-07-07 LAB — C-REACTIVE PROTEIN: CRP: 0.5 mg/dL (ref <1.0)

## 2024-07-07 LAB — BASIC METABOLIC PANEL WITH GFR
Anion gap: 8 (ref 5–15)
BUN: 19 mg/dL (ref 8–23)
CO2: 21 mmol/L — ABNORMAL LOW (ref 22–32)
Calcium: 8.6 mg/dL — ABNORMAL LOW (ref 8.9–10.3)
Chloride: 106 mmol/L (ref 98–111)
Creatinine, Ser: 1.49 mg/dL — ABNORMAL HIGH (ref 0.44–1.00)
GFR, Estimated: 36 mL/min — ABNORMAL LOW (ref >60)
Glucose, Bld: 93 mg/dL (ref 70–99)
Potassium: 4.1 mmol/L (ref 3.5–5.1)
Sodium: 135 mmol/L (ref 135–145)

## 2024-07-07 LAB — PROCALCITONIN: Procalcitonin: <0.1 ng/mL

## 2024-07-07 LAB — HEMOGLOBIN A1C
Hgb A1c MFr Bld: 5.5 % (ref 4.8–5.6)
Mean Plasma Glucose: 111.15 mg/dL

## 2024-07-07 MED ORDER — IPRATROPIUM-ALBUTEROL 0.5-2.5 (3) MG/3ML IN SOLN: 3.0000 mL | RESPIRATORY_TRACT | Status: DC | PRN

## 2024-07-07 MED ORDER — HYDRALAZINE HCL 20 MG/ML IJ SOLN
10.0000 mg | INTRAMUSCULAR | Status: DC | PRN
  Administered 2024-07-13: 10 mg via INTRAVENOUS

## 2024-07-07 MED ORDER — MAGNESIUM SULFATE 4 GM/100ML IV SOLN
4.0000 g | Freq: Once | INTRAVENOUS | Status: AC
  Administered 2024-07-07: 4 g via INTRAVENOUS
  Filled 2024-07-07: qty 100

## 2024-07-07 MED ORDER — SODIUM CHLORIDE 0.9 % IV SOLN: INTRAVENOUS | Status: DC

## 2024-07-07 MED ORDER — SODIUM CHLORIDE 0.9 % IV SOLN
1.0000 g | Freq: Two times a day (BID) | INTRAVENOUS | Status: DC
  Administered 2024-07-07 – 2024-07-13 (×12): 1 g via INTRAVENOUS
  Filled 2024-07-07 (×12): qty 20

## 2024-07-07 MED ORDER — GLUCAGON HCL RDNA (DIAGNOSTIC) 1 MG IJ SOLR: 1.0000 mg | INTRAMUSCULAR | Status: DC | PRN

## 2024-07-07 MED ORDER — GUAIFENESIN 100 MG/5ML PO LIQD: 5.0000 mL | ORAL | Status: DC | PRN

## 2024-07-07 MED ORDER — METOPROLOL TARTRATE 5 MG/5ML IV SOLN: 5.0000 mg | INTRAVENOUS | Status: DC | PRN

## 2024-07-07 NOTE — Hospital Course (Addendum)
 Brief Narrative:  78 year old with history of HTN, HLD, PAD, CKD 3B, B12 deficiency comes to the hospital after suffering from left foot dog bite.  She had came to the ER, wound was sutured, received tetanus shot and was discharged on Augmentin .  Returns back today due to worsening of infection.  Due to concerns of gangrenous ulcer on the dorsum side, Dr. Harden was consulted.  Abnormal ABIs therefore vascular consulted.  Aortoiliac duplex suggested severe disease therefore planning on left lower extremity angio later today.   Assessment & Plan:  Principal Problem:   Dog bite of ankle, sequela Active Problems:   Gangrene of left foot (HCC)   Left foot gangrene his ulcer secondary to dog bite - Patient failed outpatient p.o. Augmentin  therefore admitted to the hospital.  Seen by orthopedic Dr. Harden.  ABI and aortoiliac duplex suggestive of severe disease therefore planning on left lower extremity angio/duplex per vascular today. -Already received tetanus shot during initial visit - Pain control, bowel regimen  History of peripheral arterial disease - Abnormal ABIs.  LDL 120, A1c 5.5 - Aspirin  and statin  Essential hypertension, uncontrolled - Norvasc , Toprol -XL.  Add hydralazine  - IV as needed ordered  CKD stage IIIb - Baseline creatinine 1.5.  Continue to monitor renal function  Hypomagnesemia - As needed repletion  Anemia of chronic disease - Suspect baseline hemoglobin around 9.0.  Continue to closely monitor.    DVT prophylaxis: SQ Heparin     Code Status: Full Code Family Communication: Daughter present at bedside Status is: Inpatient Remains inpatient appropriate because: Ongoing management for lower extremity cellulitis   PT Follow up Recs:   Subjective: Seen at bedside.  Awaiting her angiogram.  Daughter present at bedside Examination:  General exam: Appears calm and comfortable  Respiratory system: Clear to auscultation. Respiratory effort normal. Cardiovascular  system: S1 & S2 heard, RRR. No JVD, murmurs, rubs, gallops or clicks. No pedal edema. Gastrointestinal system: Abdomen is nondistended, soft and nontender. No organomegaly or masses felt. Normal bowel sounds heard. Central nervous system: Alert and oriented. No focal neurological deficits. Extremities: Symmetric 5 x 5 power. Skin: Left lower extremity dressing is in place Psychiatry: Judgement and insight appear normal. Mood & affect appropriate.

## 2024-07-07 NOTE — Consult Note (Signed)
 [facility] [place]  ASSESSMENT / PLAN: Taylor Bush is a 78 y.o. female with possible aortoiliac occlusive disease and likely atherosclerosis of native arteries of left lower extremity causing ulceration.  Recommend:  Abstinence from all tobacco products. Blood glucose control with goal A1c < 7%. Blood pressure control with goal blood pressure < 130/80 mmHg. Lipid reduction therapy with goal LDL-C < 55 mg/dL. Aspirin  81mg  by mouth daily. Atorvastatin  40-80mg  PO QD (or other high intensity statin therapy).  Absent femoral pulses.  Fairly severe chronic kidney disease (GFR 27).  Not likely to be able to get a CT angiogram.  Will check ultrasound of aortoiliac segment.  Tentative plan for aortogram with runoff of the left lower extremity via left upper extremity access in Cath Lab Tuesday pending duplex results.  CHIEF COMPLAINT: left foot wound  HISTORY OF PRESENT ILLNESS: Taylor Bush is a 78 y.o. female who suffered a dog bite about 2 weeks ago in her left foot dorsum.  This has been poorly healing and has developed into a necrotic ulcer.  Patient has an established history of peripheral arterial disease, but has never really gotten any care for this.  She denies any antecedent claudication, rest pain, or prior episodes of ulceration.  She is a former smoker.  She is not a diabetic.  She endorses chronic kidney disease.  She has never suffered a MI or CVA.  She denies use of anticoagulation.  Past Medical History:  Diagnosis Date   Allergic rhinitis    Allergy    Anemia in chronic kidney disease (CKD)    Arthritis    B12 deficiency 09/13/2021   Blood transfusion 2012   and 2013   Blood transfusion without reported diagnosis    Cataract    bilateral sx   Chronic low back pain    with radiculopathy   CKD (chronic kidney disease) stage 3, GFR 30-59 ml/min (HCC) 11/29/2013   HLD (hyperlipidemia)    HTN (hypertension)    Hx of total hip arthroplasty  12/15/2011   Open-angle glaucoma    PAD (peripheral artery disease) (HCC)    with intermittent claudication, ABI 0.72 on Rt and 0.73 on L when assessed in 2010.     Past Surgical History:  Procedure Laterality Date   abnormal vascular studies  05/07/2009   ABI's 70, R SFA > 50%^ blockage    CATARACT EXTRACTION, BILATERAL  2011   EXCISION OF TONGUE LESION WITH LASER N/A 08/31/2015   Procedure: BIOPSY AND CO2 LASER OF TONGUE LESION ;  Surgeon: Ida Loader, MD;  Location: [facility];  Service: ENT;  Laterality: N/A;   EXCISION OF TONGUE LESION WITH LASER Left 06/29/2020   Procedure: EXCISION OF TONGUE LESION/CO2 laser w/biopsy;  Surgeon: Loader Ida, MD;  Location: [facility];  Service: ENT;  Laterality: Left;   EXCISION OF TONGUE LESION WITH LASER Bilateral 10/02/2023   Procedure: ORAL BIOPSY AND CO2 LASER ABLATATION OF TONGUE LESION;  Surgeon: Loader Ida, MD;  Location: [facility] OR;  Service: ENT;  Laterality: Bilateral;   EXCISION ORAL LESION WITH CO2 LASER N/A 12/08/2021   Procedure: EXCISION OF ORAL ORAL CAVITY LESION WITH CO2 LASER;  Surgeon: Loader Ida, MD;  Location: [facility];  Service: ENT;  Laterality: N/A;   EYE SURGERY     GLOSSECTOMY, PARTIAL Left 06/17/2024   Procedure: GLOSSECTOMY, PARTIAL;  Surgeon: Loader Ida, MD;  [facility];  Service: ENT;  Laterality: Left;  JOINT REPLACEMENT     OTHER SURGICAL HISTORY     ganglion cyst removed left wrist    TOTAL HIP ARTHROPLASTY  10/03/2011   Procedure: TOTAL HIP ARTHROPLASTY;  Surgeon: Dempsey LULLA Moan;  Location: [facility];  Service: Orthopedics;  Laterality: Right;   TOTAL HIP ARTHROPLASTY  12/21/2011   Procedure: TOTAL HIP ARTHROPLASTY;  Surgeon: Dempsey LULLA Moan, MD;  Location: [facility];  Service: Orthopedics;  Laterality: Left;   TUBAL LIGATION      Family History  Problem Relation Age of Onset   Colon polyps Mother 73   Heart attack Father        65s   Stroke Neg Hx         also no PAD or aneurysm   Colon cancer Neg Hx    Esophageal cancer Neg Hx    Stomach cancer Neg Hx    Rectal cancer Neg Hx     Social History   Socioeconomic History   Marital status: Widowed    Spouse name: Not on file   Number of children: Not on file   Years of education: Not on file   Highest education level: Not on file  Occupational History   Not on file  Tobacco Use   Smoking status: Former    Current packs/day: 0.00    Types: Cigarettes    Quit date: 11/07/2008    Years since quitting: 15.6   Smokeless tobacco: Never  Vaping Use   Vaping status: Never Used  Substance and Sexual Activity   Alcohol  use: No   Drug use: No   Sexual activity: Not on file  Other Topics Concern   Not on file  Social History Narrative   Retired Haematologist, 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    Social Drivers of Corporate investment banker Strain: Low Risk  (04/29/2024)   Overall Financial Resource Strain (CARDIA)    Difficulty of Paying Living Expenses: Not hard at all  Food Insecurity: No Food Insecurity (07/06/2024)   Hunger Vital Sign    Worried About Running Out of Food in the Last Year: Never true    Ran Out of Food in the Last Year: Never true  Transportation Needs: No Transportation Needs (07/06/2024)   PRAPARE - Administrator, Civil Service (Medical): No    Lack of Transportation (Non-Medical): No  Physical Activity: Insufficiently Active (04/29/2024)   Exercise Vital Sign    Days of Exercise per Week: 7 days    Minutes of Exercise per Session: 20 min  Stress: No Stress Concern Present (04/29/2024)   [employer] of Occupational Health - Occupational Stress Questionnaire    Feeling of Stress: Not at all  Social Connections: Moderately Isolated (07/06/2024)   Social Connection and Isolation Panel    Frequency of Communication with Friends and Family: More than three times a week    Frequency of Social Gatherings with Friends and Family: More than three times a  week    Attends Religious Services: More than 4 times per year    Active Member of [employer] or Organizations: No    Attends Banker Meetings: Never    Marital Status: Widowed  Intimate Partner Violence: Not At Risk (07/06/2024)   Humiliation, Afraid, Rape, and Kick questionnaire    Fear of Current or Ex-Partner: No    Emotionally Abused: No    Physically Abused: No    Sexually Abused: No    Allergies  Allergen Reactions  Prednisone Other (See Comments)    Joint pain,joint stiffness   Statins Rash    hands broke out    Current Facility-Administered Medications  Medication Dose Route Frequency Provider Last Rate Last Admin   acetaminophen  (TYLENOL ) tablet 650 mg  650 mg Oral Q6H PRN Singh, Prashant K, MD   650 mg at 07/06/24 2217   Or   acetaminophen  (TYLENOL ) suppository 650 mg  650 mg Rectal Q6H PRN Singh, Prashant K, MD       amLODipine  (NORVASC ) tablet 10 mg  10 mg Oral Daily Singh, Prashant K, MD   10 mg at 07/07/24 1010   artificial tears ophthalmic solution 2 drop  2 drop Both Eyes BID PRN Singh, Prashant K, MD       aspirin  EC tablet 81 mg  81 mg Oral Daily Singh, Prashant K, MD   81 mg at 07/07/24 1010   glucagon  (human recombinant) (GLUCAGEN) injection 1 mg  1 mg Intravenous PRN Amin, Ankit C, MD       guaiFENesin  (ROBITUSSIN) 100 MG/5ML liquid 5 mL  5 mL Oral Q4H PRN Amin, Ankit C, MD       heparin  injection 5,000 Units  5,000 Units Subcutaneous Q8H Singh, Prashant K, MD   5,000 Units at 07/07/24 1453   hydrALAZINE  (APRESOLINE ) injection 10 mg  10 mg Intravenous Q4H PRN Amin, Ankit C, MD       ipratropium-albuterol  (DUONEB) 0.5-2.5 (3) MG/3ML nebulizer solution 3 mL  3 mL Nebulization Q4H PRN Amin, Ankit C, MD       meropenem  (MERREM ) 1 g in sodium chloride  0.9 % 100 mL IVPB  1 g Intravenous Q12H Dang, Thuy D, RPH       metoprolol  succinate (TOPROL -XL) 24 hr tablet 200 mg  200 mg Oral Daily Singh, Prashant K, MD   200 mg at 07/07/24 1026   metoprolol  tartrate  (LOPRESSOR ) injection 5 mg  5 mg Intravenous Q4H PRN Amin, Ankit C, MD       ondansetron  (ZOFRAN ) tablet 4 mg  4 mg Oral Q6H PRN Singh, Prashant K, MD       Or   ondansetron  (ZOFRAN ) injection 4 mg  4 mg Intravenous Q6H PRN Singh, Prashant K, MD       senna-docusate (Senokot-S) tablet 1 tablet  1 tablet Oral QHS PRN Singh, Prashant K, MD       traMADol  (ULTRAM ) tablet 50 mg  50 mg Oral Q12H PRN Singh, Prashant K, MD   50 mg at 07/07/24 1010    PHYSICAL EXAM Vitals:   07/07/24 0602 07/07/24 0608 07/07/24 0721 07/07/24 1428  BP: (!) 167/65 (!) 167/65 (!) 158/63 (!) 160/65  Pulse: (!) 58  64 (!) 59  Resp: 19     Temp: 97.9 F (36.6 C)  97.8 F (36.6 C) 97.7 F (36.5 C)  TempSrc: Oral  Oral Oral  SpO2: 96%  98% 98%  Weight:       Elderly woman in no distress Regular rate and rhythm Unlabored breathing No palpable femoral pulses No palpable pedal pulses Ischemic ulcer in the left foot dorsum measuring about the size of a half dollar   PERTINENT LABORATORY AND RADIOLOGIC DATA  Most recent CBC    Latest Ref Rng & Units 07/07/2024    2:18 AM 07/06/2024   12:57 PM 07/06/2024    9:39 AM  CBC  WBC 4.0 - 10.5 K/uL 9.2  12.0  11.8   Hemoglobin 12.0 - 15.0 g/dL 8.4  89.8  9.2   Hematocrit 36.0 - 46.0 % 25.9  31.0  28.2   Platelets 150 - 400 K/uL 319  387  354      Most recent CMP    Latest Ref Rng & Units 07/07/2024    2:18 AM 07/06/2024   12:57 PM 07/06/2024    9:39 AM  CMP  Glucose 70 - 99 mg/dL 93   896   BUN 8 - 23 mg/dL 19   18   Creatinine 9.55 - 1.00 mg/dL 8.50  8.52  8.42   Sodium 135 - 145 mmol/L 135   133   Potassium 3.5 - 5.1 mmol/L 4.1   4.7   Chloride 98 - 111 mmol/L 106   100   CO2 22 - 32 mmol/L 21   23   Calcium  8.9 - 10.3 mg/dL 8.6   8.8   Total Protein 6.5 - 8.1 g/dL   6.1   Total Bilirubin 0.0 - 1.2 mg/dL   0.5   Alkaline Phos 38 - 126 U/L   53   AST 15 - 41 U/L   17   ALT 0 - 44 U/L   13     Renal function Estimated Creatinine Clearance: 27 mL/min  (A) (by C-G formula based on SCr of 1.49 mg/dL (H)).  Hgb A1c MFr Bld (%)  Date Value  09/14/2023 5.7    LDL Cholesterol  Date Value Ref Range Status  09/14/2023 118 (H) 0 - 99 mg/dL Final   Direct LDL  Date Value Ref Range Status  09/13/2021 163.0 mg/dL Final    Comment:    Optimal:  <100 mg/dLNear or Above Optimal:  100-129 mg/dLBorderline High:  130-159 mg/dLHigh:  160-189 mg/dLVery High:  >190 mg/dL     +-------+-----------+-----------+------------+------------+  ABI/TBIToday's ABIToday's TBIPrevious ABIPrevious TBI  +-------+-----------+-----------+------------+------------+  Right 0.70       0.23       0.72                      +-------+-----------+-----------+------------+------------+  Left  0.53       absent     0.73                      +-------+-----------+-----------+------------+------------+     Debby SAILOR. Magda, MD FACS [facility] Phone Number: [phone] 07/07/2024 3:15 PM   Total time spent on preparing this encounter including chart review, data review, collecting history, examining the patient, and coordinating care: 60 minutes  Portions of this report may have been transcribed using voice recognition software.  Every effort has been made to ensure accuracy; however, inadvertent computerized transcription errors may still be present.

## 2024-07-07 NOTE — Plan of Care (Signed)
  Problem: Activity: Goal: Risk for activity intolerance will decrease Outcome: Progressing   Problem: Pain Managment: Goal: General experience of comfort will improve and/or be controlled Outcome: Progressing   Problem: Skin Integrity: Goal: Risk for impaired skin integrity will decrease Outcome: Progressing

## 2024-07-07 NOTE — Progress Notes (Signed)
 VASCULAR LAB    ABI has been performed.  See CV proc for preliminary results.   Manvi Guilliams, RVT 07/07/2024, 1:17 PM

## 2024-07-07 NOTE — H&P (View-Only) (Signed)
 [facility] [place]  ASSESSMENT / PLAN: Taylor Bush is a 78 y.o. female with possible aortoiliac occlusive disease and likely atherosclerosis of native arteries of left lower extremity causing ulceration.  Recommend:  Abstinence from all tobacco products. Blood glucose control with goal A1c < 7%. Blood pressure control with goal blood pressure < 130/80 mmHg. Lipid reduction therapy with goal LDL-C < 55 mg/dL. Aspirin  81mg  by mouth daily. Atorvastatin  40-80mg  PO QD (or other high intensity statin therapy).  Absent femoral pulses.  Fairly severe chronic kidney disease (GFR 27).  Not likely to be able to get a CT angiogram.  Will check ultrasound of aortoiliac segment.  Tentative plan for aortogram with runoff of the left lower extremity via left upper extremity access in Cath Lab Tuesday pending duplex results.  CHIEF COMPLAINT: left foot wound  HISTORY OF PRESENT ILLNESS: Taylor Bush is a 78 y.o. female who suffered a dog bite about 2 weeks ago in her left foot dorsum.  This has been poorly healing and has developed into a necrotic ulcer.  Patient has an established history of peripheral arterial disease, but has never really gotten any care for this.  She denies any antecedent claudication, rest pain, or prior episodes of ulceration.  She is a former smoker.  She is not a diabetic.  She endorses chronic kidney disease.  She has never suffered a MI or CVA.  She denies use of anticoagulation.  Past Medical History:  Diagnosis Date   Allergic rhinitis    Allergy    Anemia in chronic kidney disease (CKD)    Arthritis    B12 deficiency 09/13/2021   Blood transfusion 2012   and 2013   Blood transfusion without reported diagnosis    Cataract    bilateral sx   Chronic low back pain    with radiculopathy   CKD (chronic kidney disease) stage 3, GFR 30-59 ml/min (HCC) 11/29/2013   HLD (hyperlipidemia)    HTN (hypertension)    Hx of total hip arthroplasty  12/15/2011   Open-angle glaucoma    PAD (peripheral artery disease) (HCC)    with intermittent claudication, ABI 0.72 on Rt and 0.73 on L when assessed in 2010.     Past Surgical History:  Procedure Laterality Date   abnormal vascular studies  05/07/2009   ABI's 70, R SFA > 50%^ blockage    CATARACT EXTRACTION, BILATERAL  2011   EXCISION OF TONGUE LESION WITH LASER N/A 08/31/2015   Procedure: BIOPSY AND CO2 LASER OF TONGUE LESION ;  Surgeon: Ida Loader, MD;  Location: [facility];  Service: ENT;  Laterality: N/A;   EXCISION OF TONGUE LESION WITH LASER Left 06/29/2020   Procedure: EXCISION OF TONGUE LESION/CO2 laser w/biopsy;  Surgeon: Loader Ida, MD;  Location: [facility];  Service: ENT;  Laterality: Left;   EXCISION OF TONGUE LESION WITH LASER Bilateral 10/02/2023   Procedure: ORAL BIOPSY AND CO2 LASER ABLATATION OF TONGUE LESION;  Surgeon: Loader Ida, MD;  Location: [facility] OR;  Service: ENT;  Laterality: Bilateral;   EXCISION ORAL LESION WITH CO2 LASER N/A 12/08/2021   Procedure: EXCISION OF ORAL ORAL CAVITY LESION WITH CO2 LASER;  Surgeon: Loader Ida, MD;  Location: [facility];  Service: ENT;  Laterality: N/A;   EYE SURGERY     GLOSSECTOMY, PARTIAL Left 06/17/2024   Procedure: GLOSSECTOMY, PARTIAL;  Surgeon: Loader Ida, MD;  Location: [facility];  Service: ENT;  Laterality: Left;  JOINT REPLACEMENT     OTHER SURGICAL HISTORY     ganglion cyst removed left wrist    TOTAL HIP ARTHROPLASTY  10/03/2011   Procedure: TOTAL HIP ARTHROPLASTY;  Surgeon: Dempsey LULLA Moan;  Location: [facility];  Service: Orthopedics;  Laterality: Right;   TOTAL HIP ARTHROPLASTY  12/21/2011   Procedure: TOTAL HIP ARTHROPLASTY;  Surgeon: Dempsey LULLA Moan, MD;  Location: [facility];  Service: Orthopedics;  Laterality: Left;   TUBAL LIGATION      Family History  Problem Relation Age of Onset   Colon polyps Mother 73   Heart attack Father        65s   Stroke Neg Hx         also no PAD or aneurysm   Colon cancer Neg Hx    Esophageal cancer Neg Hx    Stomach cancer Neg Hx    Rectal cancer Neg Hx     Social History   Socioeconomic History   Marital status: Widowed    Spouse name: Not on file   Number of children: Not on file   Years of education: Not on file   Highest education level: Not on file  Occupational History   Not on file  Tobacco Use   Smoking status: Former    Current packs/day: 0.00    Types: Cigarettes    Quit date: 11/07/2008    Years since quitting: 15.6   Smokeless tobacco: Never  Vaping Use   Vaping status: Never Used  Substance and Sexual Activity   Alcohol  use: No   Drug use: No   Sexual activity: Not on file  Other Topics Concern   Not on file  Social History Narrative   Retired Haematologist, 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    Social Drivers of Corporate investment banker Strain: Low Risk  (04/29/2024)   Overall Financial Resource Strain (CARDIA)    Difficulty of Paying Living Expenses: Not hard at all  Food Insecurity: No Food Insecurity (07/06/2024)   Hunger Vital Sign    Worried About Running Out of Food in the Last Year: Never true    Ran Out of Food in the Last Year: Never true  Transportation Needs: No Transportation Needs (07/06/2024)   PRAPARE - Administrator, Civil Service (Medical): No    Lack of Transportation (Non-Medical): No  Physical Activity: Insufficiently Active (04/29/2024)   Exercise Vital Sign    Days of Exercise per Week: 7 days    Minutes of Exercise per Session: 20 min  Stress: No Stress Concern Present (04/29/2024)   [employer] of Occupational Health - Occupational Stress Questionnaire    Feeling of Stress: Not at all  Social Connections: Moderately Isolated (07/06/2024)   Social Connection and Isolation Panel    Frequency of Communication with Friends and Family: More than three times a week    Frequency of Social Gatherings with Friends and Family: More than three times a  week    Attends Religious Services: More than 4 times per year    Active Member of [employer] or Organizations: No    Attends Banker Meetings: Never    Marital Status: Widowed  Intimate Partner Violence: Not At Risk (07/06/2024)   Humiliation, Afraid, Rape, and Kick questionnaire    Fear of Current or Ex-Partner: No    Emotionally Abused: No    Physically Abused: No    Sexually Abused: No    Allergies  Allergen Reactions  Prednisone Other (See Comments)    Joint pain,joint stiffness   Statins Rash    hands broke out    Current Facility-Administered Medications  Medication Dose Route Frequency Provider Last Rate Last Admin   acetaminophen  (TYLENOL ) tablet 650 mg  650 mg Oral Q6H PRN Singh, Prashant K, MD   650 mg at 07/06/24 2217   Or   acetaminophen  (TYLENOL ) suppository 650 mg  650 mg Rectal Q6H PRN Singh, Prashant K, MD       amLODipine  (NORVASC ) tablet 10 mg  10 mg Oral Daily Singh, Prashant K, MD   10 mg at 07/07/24 1010   artificial tears ophthalmic solution 2 drop  2 drop Both Eyes BID PRN Singh, Prashant K, MD       aspirin  EC tablet 81 mg  81 mg Oral Daily Singh, Prashant K, MD   81 mg at 07/07/24 1010   glucagon  (human recombinant) (GLUCAGEN) injection 1 mg  1 mg Intravenous PRN Amin, Ankit C, MD       guaiFENesin  (ROBITUSSIN) 100 MG/5ML liquid 5 mL  5 mL Oral Q4H PRN Amin, Ankit C, MD       heparin  injection 5,000 Units  5,000 Units Subcutaneous Q8H Singh, Prashant K, MD   5,000 Units at 07/07/24 1453   hydrALAZINE  (APRESOLINE ) injection 10 mg  10 mg Intravenous Q4H PRN Amin, Ankit C, MD       ipratropium-albuterol  (DUONEB) 0.5-2.5 (3) MG/3ML nebulizer solution 3 mL  3 mL Nebulization Q4H PRN Amin, Ankit C, MD       meropenem  (MERREM ) 1 g in sodium chloride  0.9 % 100 mL IVPB  1 g Intravenous Q12H Dang, Thuy D, RPH       metoprolol  succinate (TOPROL -XL) 24 hr tablet 200 mg  200 mg Oral Daily Singh, Prashant K, MD   200 mg at 07/07/24 1026   metoprolol  tartrate  (LOPRESSOR ) injection 5 mg  5 mg Intravenous Q4H PRN Amin, Ankit C, MD       ondansetron  (ZOFRAN ) tablet 4 mg  4 mg Oral Q6H PRN Singh, Prashant K, MD       Or   ondansetron  (ZOFRAN ) injection 4 mg  4 mg Intravenous Q6H PRN Singh, Prashant K, MD       senna-docusate (Senokot-S) tablet 1 tablet  1 tablet Oral QHS PRN Singh, Prashant K, MD       traMADol  (ULTRAM ) tablet 50 mg  50 mg Oral Q12H PRN Singh, Prashant K, MD   50 mg at 07/07/24 1010    PHYSICAL EXAM Vitals:   07/07/24 0602 07/07/24 0608 07/07/24 0721 07/07/24 1428  BP: (!) 167/65 (!) 167/65 (!) 158/63 (!) 160/65  Pulse: (!) 58  64 (!) 59  Resp: 19     Temp: 97.9 F (36.6 C)  97.8 F (36.6 C) 97.7 F (36.5 C)  TempSrc: Oral  Oral Oral  SpO2: 96%  98% 98%  Weight:       Elderly woman in no distress Regular rate and rhythm Unlabored breathing No palpable femoral pulses No palpable pedal pulses Ischemic ulcer in the left foot dorsum measuring about the size of a half dollar   PERTINENT LABORATORY AND RADIOLOGIC DATA  Most recent CBC    Latest Ref Rng & Units 07/07/2024    2:18 AM 07/06/2024   12:57 PM 07/06/2024    9:39 AM  CBC  WBC 4.0 - 10.5 K/uL 9.2  12.0  11.8   Hemoglobin 12.0 - 15.0 g/dL 8.4  89.8  9.2   Hematocrit 36.0 - 46.0 % 25.9  31.0  28.2   Platelets 150 - 400 K/uL 319  387  354      Most recent CMP    Latest Ref Rng & Units 07/07/2024    2:18 AM 07/06/2024   12:57 PM 07/06/2024    9:39 AM  CMP  Glucose 70 - 99 mg/dL 93   896   BUN 8 - 23 mg/dL 19   18   Creatinine 9.55 - 1.00 mg/dL 8.50  8.52  8.42   Sodium 135 - 145 mmol/L 135   133   Potassium 3.5 - 5.1 mmol/L 4.1   4.7   Chloride 98 - 111 mmol/L 106   100   CO2 22 - 32 mmol/L 21   23   Calcium  8.9 - 10.3 mg/dL 8.6   8.8   Total Protein 6.5 - 8.1 g/dL   6.1   Total Bilirubin 0.0 - 1.2 mg/dL   0.5   Alkaline Phos 38 - 126 U/L   53   AST 15 - 41 U/L   17   ALT 0 - 44 U/L   13     Renal function Estimated Creatinine Clearance: 27 mL/min  (A) (by C-G formula based on SCr of 1.49 mg/dL (H)).  Hgb A1c MFr Bld (%)  Date Value  09/14/2023 5.7    LDL Cholesterol  Date Value Ref Range Status  09/14/2023 118 (H) 0 - 99 mg/dL Final   Direct LDL  Date Value Ref Range Status  09/13/2021 163.0 mg/dL Final    Comment:    Optimal:  <100 mg/dLNear or Above Optimal:  100-129 mg/dLBorderline High:  130-159 mg/dLHigh:  160-189 mg/dLVery High:  >190 mg/dL     +-------+-----------+-----------+------------+------------+  ABI/TBIToday's ABIToday's TBIPrevious ABIPrevious TBI  +-------+-----------+-----------+------------+------------+  Right 0.70       0.23       0.72                      +-------+-----------+-----------+------------+------------+  Left  0.53       absent     0.73                      +-------+-----------+-----------+------------+------------+     Debby SAILOR. Magda, MD FACS [facility] Phone Number: [phone] 07/07/2024 3:15 PM   Total time spent on preparing this encounter including chart review, data review, collecting history, examining the patient, and coordinating care: 60 minutes  Portions of this report may have been transcribed using voice recognition software.  Every effort has been made to ensure accuracy; however, inadvertent computerized transcription errors may still be present.

## 2024-07-07 NOTE — Evaluation (Signed)
 Physical Therapy Evaluation Patient Details Name: Taylor Bush MRN: [id number] DOB: Nov 09, 1945 Today's Date: 07/07/2024  History of Present Illness  Pt is a 78 yo female admitted to [facility] on 8/30 due to poorly healing dog bite to anterior side of Left foot sustained approximately 2 weeks ago that has now developed into a necrotic ulcer. PMH: HTN, HLD, PAD, CKD 3B, B12 deficiency   Clinical Impression  Pt in bed upon arrival of PT, agreeable to evaluation at this time. Prior to admission the pt was completely independent without need for DME or assistance, living with spouse in a home with 4 steps to enter. The pt was able to demo good independence with all transfers, ambulation, and stair navigation this session, will continue to follow and re-evaluate after possible surgical intervention. At this time, pt does not need any follow up therapies or DME, is able to return home with PRN assist from family.      If plan is discharge home, recommend the following:     Can travel by private vehicle        Equipment Recommendations None recommended by PT  Recommendations for Other Services       Functional Status Assessment Patient has not had a recent decline in their functional status     Precautions / Restrictions Precautions Precautions: Fall Recall of Precautions/Restrictions: Intact Restrictions Weight Bearing Restrictions Per Provider Order: No      Mobility  Bed Mobility Overal bed mobility: Independent                  Transfers Overall transfer level: Independent Equipment used: None               General transfer comment: independent, no instability    Ambulation/Gait Ambulation/Gait assistance: Supervision Gait Distance (Feet): 250 Feet Assistive device: None Gait Pattern/deviations: Decreased stance time - left, Antalgic Gait velocity: decreased Gait velocity interpretation: <1.31 ft/sec, indicative of household ambulator   General Gait Details:  antalgic gait with limited wt on L foot but no instability or change in pain. no need for UE support  Stairs Stairs: Yes Stairs assistance: Supervision Stair Management: One rail Left, Step to pattern, Forwards Number of Stairs: 4 General stair comments: educated on up with RLE and down with LLE    Balance Overall balance assessment: No apparent balance deficits (not formally assessed) (stable with balance testing without DME)                                           Pertinent Vitals/Pain Pain Assessment Pain Assessment: No/denies pain    Home Living Family/patient expects to be discharged to:: Private residence Living Arrangements: Children (son) Available Help at Discharge: Family;Available PRN/intermittently Type of Home: House Home Access: Stairs to enter Entrance Stairs-Rails: Left (rail on left can also hold onto house on the left) Entrance Stairs-Number of Steps: 4-5   Home Layout: One level Home Equipment: Pharmacist, hospital (2 wheels);Hand held shower head (suction cup grab bars)      Prior Function Prior Level of Function : Independent/Modified Independent             Mobility Comments: Ind without an AD ADLs Comments: Ind with ADLs and IADLs     Extremity/Trunk Assessment   Upper Extremity Assessment Upper Extremity Assessment: Defer to OT evaluation;Right hand dominant;Overall [facility] for tasks assessed    Lower  Extremity Assessment Lower Extremity Assessment: Overall WFL for tasks assessed    Cervical / Trunk Assessment Cervical / Trunk Assessment: Normal  Communication   Communication Communication: No apparent difficulties    Cognition Arousal: Alert Behavior During Therapy: WFL for tasks assessed/performed   PT - Cognitive impairments: No apparent impairments                       PT - Cognition Comments: not formally assessed, but functional throughout session Following commands: Intact        Cueing Cueing Techniques: Verbal cues     General Comments General comments (skin integrity, edema, etc.): VSS on RA    Exercises     Assessment/Plan    PT Assessment Patient needs continued PT services  PT Problem List Decreased activity tolerance;Decreased balance;Decreased mobility       PT Treatment Interventions DME instruction;Gait training;Stair training;Functional mobility training;Therapeutic activities;Therapeutic exercise;Balance training;Patient/family education    PT Goals (Current goals can be found in the Care Plan section)  Acute Rehab PT Goals Patient Stated Goal: to return home PT Goal Formulation: With patient Time For Goal Achievement: 07/21/24 Potential to Achieve Goals: Good    Frequency Min 1X/week        AM-PAC PT 6 Clicks Mobility  Outcome Measure Help needed turning from your back to your side while in a flat bed without using bedrails?: None Help needed moving from lying on your back to sitting on the side of a flat bed without using bedrails?: None Help needed moving to and from a bed to a chair (including a wheelchair)?: None Help needed standing up from a chair using your arms (e.g., wheelchair or bedside chair)?: None Help needed to walk in hospital room?: None Help needed climbing 3-5 steps with a railing? : None 6 Click Score: 24    End of Session Equipment Utilized During Treatment: Gait belt Activity Tolerance: Patient tolerated treatment well Patient left: in bed;with call bell/phone within reach Nurse Communication: Mobility status PT Visit Diagnosis: Unsteadiness on feet (R26.81)    Time: 8472-8460 PT Time Calculation (min) (ACUTE ONLY): 12 min   Charges:   PT Evaluation $PT Eval Low Complexity: 1 Low   PT General Charges $$ ACUTE PT VISIT: 1 Visit         Izetta Call, PT, DPT   Acute Rehabilitation Department Office [phone] Secure Chat Communication Preferred  Izetta JULIANNA Call 07/07/2024, 6:11 PM

## 2024-07-07 NOTE — Progress Notes (Signed)
 PROGRESS NOTE    Taylor Bush  FMW:[id number] DOB: 08/10/1946 DOA: 07/06/2024 PCP: Watt Mirza, MD    Brief Narrative:  78 year old with history of HTN, HLD, PAD, CKD 3B, B12 deficiency comes to the hospital after suffering from left foot dog bite.  She had came to the ER, wound was sutured, received tetanus shot and was discharged on Augmentin .  Returns back today due to worsening of infection.  Due to concerns of gangrenous ulcer on the dorsum side, Dr. Harden was consulted.  ABIs have been ordered.   Assessment & Plan:  Principal Problem:   Dog bite of ankle, sequela Active Problems:   Gangrene of left foot (HCC)   Left foot gangrene his ulcer secondary to dog bite - Failed outpatient p.o. Augmentin  therefore returns to the hospital.  Due to prior history of PAD, ABIs have been ordered.  Dr. Harden has been consulted.  Currently on broad-spectrum antibiotics IV meropenem , will further adjust as appropriate. -Already received tetanus shot during initial visit - Pain control, bowel regimen  History of peripheral arterial disease - Not much history available in regards to prior PAD. - Continue aspirin  - Follow-up ABI  Essential hypertension - Norvasc , Toprol -XL - IV as needed ordered  CKD stage IIIb - Baseline creatinine 1.5.  Continue to monitor renal function  Hypomagnesemia - As needed repletion  Anemia of chronic disease - Suspect baseline hemoglobin around 9.0.  Continue to closely monitor.     DVT prophylaxis: heparin  injection 5,000 Units Start: 07/06/24 1400    Code Status: Full Code Family Communication:   Status is: Inpatient Remains inpatient appropriate because: Ongoing management for lower extremity cellulitis   PT Follow up Recs:   Subjective: Feeling okay no complaints.   Examination:  General exam: Appears calm and comfortable  Respiratory system: Clear to auscultation. Respiratory effort normal. Cardiovascular system: S1 & S2 heard, RRR.  No JVD, murmurs, rubs, gallops or clicks. No pedal edema. Gastrointestinal system: Abdomen is nondistended, soft and nontender. No organomegaly or masses felt. Normal bowel sounds heard. Central nervous system: Alert and oriented. No focal neurological deficits. Extremities: Symmetric 5 x 5 power. Skin: Left lower extremity dressing is in place Psychiatry: Judgement and insight appear normal. Mood & affect appropriate.                Diet Orders (From admission, onward)     Start     Ordered   07/06/24 1129  Diet Heart Room service appropriate? Yes; Fluid consistency: Thin  Diet effective now       Question Answer Comment  Room service appropriate? Yes   Fluid consistency: Thin      07/06/24 1129            Objective: Vitals:   07/06/24 2026 07/07/24 0602 07/07/24 0608 07/07/24 0721  BP: (!) 138/57 (!) 167/65 (!) 167/65 (!) 158/63  Pulse: 60 (!) 58  64  Resp:  19    Temp: 98.2 F (36.8 C) 97.9 F (36.6 C)  97.8 F (36.6 C)  TempSrc: Oral Oral  Oral  SpO2: 97% 96%  98%  Weight:        Intake/Output Summary (Last 24 hours) at 07/07/2024 0942 Last data filed at 07/07/2024 0400 Gross per 24 hour  Intake 1171.93 ml  Output --  Net 1171.93 ml   Filed Weights   07/06/24 0813  Weight: 67.1 kg    Scheduled Meds:  amLODipine   10 mg Oral Daily   aspirin  EC  81 mg Oral Daily   heparin   5,000 Units Subcutaneous Q8H   metoprolol   200 mg Oral Daily   Continuous Infusions:  lactated ringers  75 mL/hr at 07/06/24 1820   magnesium  sulfate bolus IVPB     meropenem  (MERREM ) IV 500 mg (07/07/24 0612)    Nutritional status     Body mass index is 28.89 kg/m.  Data Reviewed:   CBC: Recent Labs  Lab 07/06/24 0939 07/06/24 1257 07/07/24 0218  WBC 11.8* 12.0* 9.2  NEUTROABS 8.6*  --  5.5  HGB 9.2* 10.1* 8.4*  HCT 28.2* 31.0* 25.9*  MCV 98.6 97.2 97.7  PLT 354 387 319   Basic Metabolic Panel: Recent Labs  Lab 07/06/24 0939 07/06/24 1257  07/07/24 0218  NA 133*  --  135  K 4.7  --  4.1  CL 100  --  106  CO2 23  --  21*  GLUCOSE 103*  --  93  BUN 18  --  19  CREATININE 1.57* 1.47* 1.49*  CALCIUM  8.8*  --  8.6*  MG  --   --  1.6*   GFR: Estimated Creatinine Clearance: 27 mL/min (A) (by C-G formula based on SCr of 1.49 mg/dL (H)). Liver Function Tests: Recent Labs  Lab 07/06/24 0939  AST 17  ALT 13  ALKPHOS 53  BILITOT 0.5  PROT 6.1*  ALBUMIN 3.1*   No results for input(s): LIPASE, AMYLASE in the last 168 hours. No results for input(s): AMMONIA in the last 168 hours. Coagulation Profile: Recent Labs  Lab 07/07/24 0218  INR 0.9   Cardiac Enzymes: No results for input(s): CKTOTAL, CKMB, CKMBINDEX, TROPONINI in the last 168 hours. BNP (last 3 results) No results for input(s): PROBNP in the last 8760 hours. HbA1C: No results for input(s): HGBA1C in the last 72 hours. CBG: No results for input(s): GLUCAP in the last 168 hours. Lipid Profile: No results for input(s): CHOL, HDL, LDLCALC, TRIG, CHOLHDL, LDLDIRECT in the last 72 hours. Thyroid  Function Tests: No results for input(s): TSH, T4TOTAL, FREET4, T3FREE, THYROIDAB in the last 72 hours. Anemia Panel: No results for input(s): VITAMINB12, FOLATE, FERRITIN, TIBC, IRON , RETICCTPCT in the last 72 hours. Sepsis Labs: Recent Labs  Lab 07/06/24 0944 07/06/24 1257 07/07/24 0218  PROCALCITON  --  <0.10 <0.10  LATICACIDVEN 0.9  --   --     Recent Results (from the past 240 hours)  Culture, blood (Routine X 2) w Reflex to ID Panel     Status: None (Preliminary result)   Collection Time: 07/06/24 12:57 PM   Specimen: BLOOD LEFT ARM  Result Value Ref Range Status   Specimen Description BLOOD LEFT ARM  Final   Special Requests   Final    BOTTLES DRAWN AEROBIC ONLY Blood Culture adequate volume   Culture   Final    NO GROWTH < 24 HOURS Performed at [facility], [address], [place],  [place] [address]    Report Status PENDING  Incomplete         Radiology Studies: No results found.         LOS: 1 day   Time spent= 35 mins    Burgess JAYSON Dare, MD [facility]  If 7PM-7AM, please contact night-coverage  07/07/2024, 9:42 AM

## 2024-07-07 NOTE — Evaluation (Signed)
 Occupational Therapy Evaluation and Discharge Patient Details Name: Taylor Bush MRN: [id number] DOB: [birth year] Today's Date: 07/07/2024   History of Present Illness   Pt is a 78 yo female admitted to [facility] on 8/30 due to poorly healing dog bite to anterior side of Left foot sustained approximately 2 weeks ago that has now developed into a necrotic ulcer. PMH: HTN, HLD, PAD, CKD 3B, B12 deficiency     Clinical Impressions Pt currently at or very near baseline PLOF with pt demonstrating ability to complete all ADLs Independent to Mod I, bed mobility Independent, and functional mobility/transfers without an AD Mod I. Pt reports discomfort with wear of sock/shoe on L foot at this time due to wound and bandage and preference to not wear sock/shoe on bandaged L foot. OT recommends close Supervision for safety during functional transfers/mobility at this time when sock/shoe not donned on Left foot with pt verbalizing understanding of recommendation. No further benefit from acute skilled OT services at this time. Please reconsult as appropriate if change in pt medical condition/functional status. OT is signing off at this time.      If plan is discharge home, recommend the following:         Functional Status Assessment   Patient has not had a recent decline in their functional status     Equipment Recommendations   None recommended by OT     Recommendations for Other Services         Precautions/Restrictions   Precautions Precautions: Fall Recall of Precautions/Restrictions: Intact Restrictions Weight Bearing Restrictions Per Provider Order: No     Mobility Bed Mobility Overal bed mobility: Independent                  Transfers Overall transfer level: Modified independent (to Supervision to increased safety) Equipment used: None               General transfer comment: Pt Mod I to Sueprvision for safety as bandage on Left foot makes wearing sock or  sock uncomfortable for pt and pt prefering to ambutate without sock or shoe donned on Left foot. Pt demonstrating functional transfers and functional mobility in room with no noted balance deficits.      Balance Overall balance assessment: No apparent balance deficits (not formally assessed) (Recommend Supervision for safety during ambulation when no sock or shoe is donned on Left foot due to bandage)                                         ADL either performed or assessed with clinical judgement   ADL Overall ADL's : Independent;Modified independent                                       General ADL Comments: Pt demonstrates ability to complete all ADLs Independent to Mod I.     Vision Baseline Vision/History: 1 Wears glasses Ability to See in Adequate Light: 1 Impaired Patient Visual Report: No change from baseline;Other (comment) (Pt reports she gave up driving due to vision issues.) Additional Comments: Vision [facility] for tasks assessed. Not formally screened or evaluated     Perception         Praxis         Pertinent Vitals/Pain Pain Assessment Pain Assessment: No/denies pain  Extremity/Trunk Assessment Upper Extremity Assessment Upper Extremity Assessment: Right hand dominant;Overall WFL for tasks assessed (pt reports mild intermittent stiffness in Left shoulder over the past few weeks)   Lower Extremity Assessment Lower Extremity Assessment: Defer to PT evaluation   Cervical / Trunk Assessment Cervical / Trunk Assessment: Normal   Communication Communication Communication: No apparent difficulties   Cognition Arousal: Alert Behavior During Therapy: WFL for tasks assessed/performed Cognition: No apparent impairments             OT - Cognition Comments: AAOx4 and pleasant throughout session. [facility] for tasks assessed.                 Following commands: Intact       Cueing  General Comments   Cueing  Techniques: Verbal cues  VSS on RA. RN present at beginning of session.   Exercises     Shoulder Instructions      Home Living Family/patient expects to be discharged to:: Private residence Living Arrangements: Children (son) Available Help at Discharge: Family;Available PRN/intermittently Type of Home: House Home Access: Stairs to enter [employer] of Steps: 4-5 Entrance Stairs-Rails: Left (rail on left can also hold onto house on the left) Home Layout: One level     Bathroom Shower/Tub: Chief Strategy Officer: Handicapped height Bathroom Accessibility: Yes How Accessible: Accessible via wheelchair;Accessible via walker Home Equipment: Shower seat;Rolling Walker (2 wheels);Hand held shower head (suction cup grab bars)          Prior Functioning/Environment Prior Level of Function : Independent/Modified Independent             Mobility Comments: Ind without an AD ADLs Comments: Ind with ADLs and IADLs    OT Problem List:     OT Treatment/Interventions:        OT Goals(Current goals can be found in the care plan section)   Acute Rehab OT Goals Patient Stated Goal: for left foot to heal OT Goal Formulation: All assessment and education complete, DC therapy   OT Frequency:       Co-evaluation              AM-PAC OT 6 Clicks Daily Activity     Outcome Measure Help from another person eating meals?: None Help from another person taking care of personal grooming?: None Help from another person toileting, which includes using toliet, bedpan, or urinal?: None Help from another person bathing (including washing, rinsing, drying)?: None Help from another person to put on and taking off regular upper body clothing?: None Help from another person to put on and taking off regular lower body clothing?: None 6 Click Score: 24   End of Session Nurse Communication: Mobility status;Other (comment) (OT signing off at this  time.)  Activity Tolerance: Patient tolerated treatment well Patient left: in bed;with call bell/phone within reach  OT Visit Diagnosis: Other abnormalities of gait and mobility (R26.89)                Time: 8798-8779 OT Time Calculation (min): 19 min Charges:  OT General Charges $OT Visit: 1 Visit OT Evaluation $OT Eval Low Complexity: 1 Low  Margarie Rockey HERO., OTR/L, MA Acute Rehab [phone]   Margarie FORBES Horns 07/07/2024, 5:51 PM

## 2024-07-08 ENCOUNTER — Inpatient Hospital Stay (HOSPITAL_COMMUNITY)

## 2024-07-08 DIAGNOSIS — I96 Gangrene, not elsewhere classified: Secondary | ICD-10-CM | POA: Diagnosis not present

## 2024-07-08 DIAGNOSIS — M6289 Other specified disorders of muscle: Secondary | ICD-10-CM

## 2024-07-08 DIAGNOSIS — S91059S Open bite, unspecified ankle, sequela: Secondary | ICD-10-CM | POA: Diagnosis not present

## 2024-07-08 DIAGNOSIS — W540XXS Bitten by dog, sequela: Secondary | ICD-10-CM | POA: Diagnosis not present

## 2024-07-08 LAB — CBC WITH DIFFERENTIAL/PLATELET
Abs Immature Granulocytes: 0.06 K/uL (ref 0.00–0.07)
Basophils Absolute: 0.1 K/uL (ref 0.0–0.1)
Basophils Relative: 1 %
Eosinophils Absolute: 0.5 K/uL (ref 0.0–0.5)
Eosinophils Relative: 4 %
HCT: 29.7 % — ABNORMAL LOW (ref 36.0–46.0)
Hemoglobin: 9.9 g/dL — ABNORMAL LOW (ref 12.0–15.0)
Immature Granulocytes: 1 %
Lymphocytes Relative: 17 %
Lymphs Abs: 1.9 K/uL (ref 0.7–4.0)
MCH: 32.1 pg (ref 26.0–34.0)
MCHC: 33.3 g/dL (ref 30.0–36.0)
MCV: 96.4 fL (ref 80.0–100.0)
Monocytes Absolute: 1.1 K/uL — ABNORMAL HIGH (ref 0.1–1.0)
Monocytes Relative: 10 %
Neutro Abs: 7.6 K/uL (ref 1.7–7.7)
Neutrophils Relative %: 67 %
Platelets: 367 K/uL (ref 150–400)
RBC: 3.08 MIL/uL — ABNORMAL LOW (ref 3.87–5.11)
RDW: 13.2 % (ref 11.5–15.5)
WBC: 11.1 K/uL — ABNORMAL HIGH (ref 4.0–10.5)
nRBC: 0 % (ref 0.0–0.2)

## 2024-07-08 LAB — BASIC METABOLIC PANEL WITH GFR
Anion gap: 10 (ref 5–15)
BUN: 16 mg/dL (ref 8–23)
CO2: 25 mmol/L (ref 22–32)
Calcium: 9.2 mg/dL (ref 8.9–10.3)
Chloride: 103 mmol/L (ref 98–111)
Creatinine, Ser: 1.38 mg/dL — ABNORMAL HIGH (ref 0.44–1.00)
GFR, Estimated: 39 mL/min — ABNORMAL LOW (ref >60)
Glucose, Bld: 104 mg/dL — ABNORMAL HIGH (ref 70–99)
Potassium: 4.3 mmol/L (ref 3.5–5.1)
Sodium: 138 mmol/L (ref 135–145)

## 2024-07-08 LAB — LIPID PANEL
Cholesterol: 204 mg/dL — ABNORMAL HIGH (ref 0–200)
HDL: 61 mg/dL (ref >40)
LDL Cholesterol: 120 mg/dL — ABNORMAL HIGH (ref 0–99)
Total CHOL/HDL Ratio: 3.3 ratio
Triglycerides: 115 mg/dL (ref <150)
VLDL: 23 mg/dL (ref 0–40)

## 2024-07-08 LAB — C-REACTIVE PROTEIN: CRP: 0.5 mg/dL (ref <1.0)

## 2024-07-08 LAB — PROCALCITONIN: Procalcitonin: <0.1 ng/mL

## 2024-07-08 LAB — PHOSPHORUS: Phosphorus: 3.2 mg/dL (ref 2.5–4.6)

## 2024-07-08 LAB — MAGNESIUM: Magnesium: 2.4 mg/dL (ref 1.7–2.4)

## 2024-07-08 LAB — VAS US ABI WITH/WO TBI
Left ABI: 0.53
Right ABI: 0.7

## 2024-07-08 MED ORDER — ATORVASTATIN CALCIUM 80 MG PO TABS
80.0000 mg | ORAL_TABLET | Freq: Every day | ORAL | Status: DC
  Administered 2024-07-08 – 2024-07-13 (×4): 80 mg via ORAL
  Filled 2024-07-08 (×5): qty 1

## 2024-07-08 MED ORDER — HYDRALAZINE HCL 25 MG PO TABS
25.0000 mg | ORAL_TABLET | Freq: Three times a day (TID) | ORAL | Status: DC
  Administered 2024-07-08 – 2024-07-13 (×12): 25 mg via ORAL
  Filled 2024-07-08 (×14): qty 1

## 2024-07-08 NOTE — Progress Notes (Signed)
 Pharmacy - Statin allergy (rash)  Spoke with patient re: statin allergy. She confused prednisone with statins which I cleared up. She said she has taken 2 or 3 statins in past but could not name them. She describes having a rash both times within first couple doses. From searching [facility], I think she's taken simvastatin and pravastatin. She is willing to try another statin medication. She could not recall any new topical products around the same time.   Would recommend atorvastatin  40 mg/d with renal dysfunction. Alternatively, Zetia could be tried.  Thank you for involving pharmacy in this patient's care.  Delon Sax, PharmD, BCPS Clinical Pharmacist Clinical phone for 07/08/2024 is [phone] 07/08/2024 2:59 PM

## 2024-07-08 NOTE — Progress Notes (Signed)
 Patient ID: Taylor Bush, female   DOB: 10/05/1946, 78 y.o.   MRN: [id number] Patient is seen in follow-up for gangrenous ulcer dorsum left foot.  Ankle-brachial indices shows no great toe pressure with monophasic flow at the ankle with an ABI of 0.53.  I have discussed her case with vascular surgery and anticipate I could proceed with treatment for the wound after vascular intervention.

## 2024-07-08 NOTE — Progress Notes (Signed)
 PROGRESS NOTE    Taylor Bush  FMW:[id number] DOB: July 04, 1946 DOA: 07/06/2024 PCP: Taylor Mirza, MD    Brief Narrative:  78 year old with history of HTN, HLD, PAD, CKD 3B, B12 deficiency comes to the hospital after suffering from left foot dog bite.  She had came to the ER, wound was sutured, received tetanus shot and was discharged on Augmentin .  Returns back today due to worsening of infection.  Due to concerns of gangrenous ulcer on the dorsum side, Dr. Harden was consulted.  ABIs have been ordered.   Assessment & Plan:  Principal Problem:   Dog bite of ankle, sequela Active Problems:   Gangrene of left foot (HCC)   Left foot gangrene his ulcer secondary to dog bite - Patient failed outpatient p.o. Augmentin  therefore admitted to the hospital.  Seen by orthopedic Dr. Harden.  ABIs showed concerns of occlusive disease therefore consulted vascular - Planning on aortoiliac segment ultrasound with possible aortogram with runoff -Already received tetanus shot during initial visit - Pain control, bowel regimen  History of peripheral arterial disease - Abnormal ABIs.  LDL 120, A1c 5.5 - Aspirin  and statin  Essential hypertension, uncontrolled - Norvasc , Toprol -XL.  Add hydralazine  - IV as needed ordered  CKD stage IIIb - Baseline creatinine 1.5.  Continue to monitor renal function  Hypomagnesemia - As needed repletion  Anemia of chronic disease - Suspect baseline hemoglobin around 9.0.  Continue to closely monitor.    DVT prophylaxis: SQ Heparin     Code Status: Full Code Family Communication:   Status is: Inpatient Remains inpatient appropriate because: Ongoing management for lower extremity cellulitis   PT Follow up Recs:   Subjective: Seen at bedside.  No new complaints. Examination:  General exam: Appears calm and comfortable  Respiratory system: Clear to auscultation. Respiratory effort normal. Cardiovascular system: S1 & S2 heard, RRR. No JVD, murmurs, rubs,  gallops or clicks. No pedal edema. Gastrointestinal system: Abdomen is nondistended, soft and nontender. No organomegaly or masses felt. Normal bowel sounds heard. Central nervous system: Alert and oriented. No focal neurological deficits. Extremities: Symmetric 5 x 5 power. Skin: Left lower extremity dressing is in place Psychiatry: Judgement and insight appear normal. Mood & affect appropriate.                Diet Orders (From admission, onward)     Start     Ordered   07/09/24 0001  Diet NPO time specified Except for: Sips with Meds  Diet effective midnight       Question:  Except for  Answer:  Taylor Bush with Meds   07/08/24 9167   07/06/24 1129  Diet Heart Room service appropriate? Yes; Fluid consistency: Thin  Diet effective now       Question Answer Comment  Room service appropriate? Yes   Fluid consistency: Thin      07/06/24 1129            Objective: Vitals:   07/07/24 1428 07/07/24 2154 07/08/24 0520 07/08/24 0743  BP: (!) 160/65 (!) 157/66 (!) 150/61 (!) 164/62  Pulse: (!) 59 65 60 (!) 59  Resp: 16   16  Temp: 97.7 F (36.5 C) 97.8 F (36.6 C) 97.7 F (36.5 C) 98 F (36.7 C)  TempSrc: Oral Oral Oral   SpO2: 98% 98% 97% 98%  Weight:        Intake/Output Summary (Last 24 hours) at 07/08/2024 1113 Last data filed at 07/07/2024 1800 Gross per 24 hour  Intake 115.2 ml  Output --  Net 115.2 ml   Filed Weights   07/06/24 0813  Weight: 67.1 kg    Scheduled Meds:  amLODipine   10 mg Oral Daily   aspirin  EC  81 mg Oral Daily   atorvastatin   80 mg Oral Daily   heparin   5,000 Units Subcutaneous Q8H   hydrALAZINE   25 mg Oral Q8H   metoprolol   200 mg Oral Daily   Continuous Infusions:  meropenem  (MERREM ) IV 1 g (07/08/24 0548)    Nutritional status     Body mass index is 28.89 kg/m.  Data Reviewed:   CBC: Recent Labs  Lab 07/06/24 0939 07/06/24 1257 07/07/24 0218 07/08/24 0538  WBC 11.8* 12.0* 9.2 11.1*  NEUTROABS 8.6*  --  5.5 7.6   HGB 9.2* 10.1* 8.4* 9.9*  HCT 28.2* 31.0* 25.9* 29.7*  MCV 98.6 97.2 97.7 96.4  PLT 354 387 319 367   Basic Metabolic Panel: Recent Labs  Lab 07/06/24 0939 07/06/24 1257 07/07/24 0218 07/08/24 0538  NA 133*  --  135 138  K 4.7  --  4.1 4.3  CL 100  --  106 103  CO2 23  --  21* 25  GLUCOSE 103*  --  93 104*  BUN 18  --  19 16  CREATININE 1.57* 1.47* 1.49* 1.38*  CALCIUM  8.8*  --  8.6* 9.2  MG  --   --  1.6* 2.4  PHOS  --   --   --  3.2   GFR: Estimated Creatinine Clearance: 29.2 mL/min (A) (by C-G formula based on SCr of 1.38 mg/dL (H)). Liver Function Tests: Recent Labs  Lab 07/06/24 0939  AST 17  ALT 13  ALKPHOS 53  BILITOT 0.5  PROT 6.1*  ALBUMIN 3.1*   No results for input(s): LIPASE, AMYLASE in the last 168 hours. No results for input(s): AMMONIA in the last 168 hours. Coagulation Profile: Recent Labs  Lab 07/07/24 0218  INR 0.9   Cardiac Enzymes: No results for input(s): CKTOTAL, CKMB, CKMBINDEX, TROPONINI in the last 168 hours. BNP (last 3 results) No results for input(s): PROBNP in the last 8760 hours. HbA1C: Recent Labs    07/07/24 0218  HGBA1C 5.5   CBG: No results for input(s): GLUCAP in the last 168 hours. Lipid Profile: Recent Labs    07/08/24 0538  CHOL 204*  HDL 61  LDLCALC 120*  TRIG 115  CHOLHDL 3.3   Thyroid  Function Tests: No results for input(s): TSH, T4TOTAL, FREET4, T3FREE, THYROIDAB in the last 72 hours. Anemia Panel: No results for input(s): VITAMINB12, FOLATE, FERRITIN, TIBC, IRON , RETICCTPCT in the last 72 hours. Sepsis Labs: Recent Labs  Lab 07/06/24 0944 07/06/24 1257 07/07/24 0218 07/08/24 0538  PROCALCITON  --  <0.10 <0.10 <0.10  LATICACIDVEN 0.9  --   --   --     Recent Results (from the past 240 hours)  Culture, blood (Routine X 2) w Reflex to ID Panel     Status: None (Preliminary result)   Collection Time: 07/06/24 12:57 PM   Specimen: BLOOD LEFT ARM  Result  Value Ref Range Status   Specimen Description BLOOD LEFT ARM  Final   Special Requests   Final    BOTTLES DRAWN AEROBIC ONLY Blood Culture adequate volume   Culture   Final    NO GROWTH 2 DAYS Performed at [facility], [address] [place], [place] [address]    Report Status PENDING  Incomplete  Radiology Studies: VAS US  ABI WITH/WO TBI Result Date: 07/07/2024  LOWER EXTREMITY DOPPLER STUDY Patient Name:  Taylor Bush  Date of Exam:   07/07/2024 Medical Rec #: [id number]      Accession #:    [id number] Date of Birth: 23-Feb-1946      Patient Gender: F Patient Age:   68 years Exam Location:  [facility] Procedure:      VAS US  ABI WITH/WO TBI Referring Phys: [facility] --------------------------------------------------------------------------------  Indications: Peripheral artery disease. Gangrenous ulcer dorsum of the left foot              status post dog bite 2 weeks ago High Risk Factors: Hypertension, hyperlipidemia, past history of smoking.  Comparison Study: Prior ABI done in 2010 Performing Technologist: Rachel Pellet RVS  Examination Guidelines: A complete evaluation includes at minimum, Doppler waveform signals and systolic blood pressure reading at the level of bilateral brachial, anterior tibial, and posterior tibial arteries, when vessel segments are accessible. Bilateral testing is considered an integral part of a complete examination. Photoelectric Plethysmograph (PPG) waveforms and toe systolic pressure readings are included as required and additional duplex testing as needed. Limited examinations for reoccurring indications may be performed as noted.  ABI Findings: +---------+------------------+-----+----------+--------+ Right    Rt Pressure (mmHg)IndexWaveform  Comment  +---------+------------------+-----+----------+--------+ Brachial 171                    triphasic          +---------+------------------+-----+----------+--------+ PTA       85                0.50 monophasic         +---------+------------------+-----+----------+--------+ DP       119               0.70 monophasic         +---------+------------------+-----+----------+--------+ Great Toe39                0.23 Abnormal           +---------+------------------+-----+----------+--------+ +---------+------------------+-----+----------+-------+ Left     Lt Pressure (mmHg)IndexWaveform  Comment +---------+------------------+-----+----------+-------+ Brachial 164                    triphasic         +---------+------------------+-----+----------+-------+ PTA      90                0.53 monophasic        +---------+------------------+-----+----------+-------+ DP       85                0.50 monophasic        +---------+------------------+-----+----------+-------+ Great Toe0                 0.00 Absent            +---------+------------------+-----+----------+-------+ +-------+-----------+-----------+------------+------------+ ABI/TBIToday's ABIToday's TBIPrevious ABIPrevious TBI +-------+-----------+-----------+------------+------------+ Right  0.70       0.23       0.72                     +-------+-----------+-----------+------------+------------+ Left   0.53       absent     0.73                     +-------+-----------+-----------+------------+------------+  Summary: Right: Resting right ankle-brachial index indicates moderate right lower extremity arterial disease. The right toe-brachial index is abnormal.  Left: Resting  left ankle-brachial index indicates moderate to severe left lower extremity arterial disease. The left toe-brachial index is abnormal.  *See table(s) above for measurements and observations.     Preliminary            LOS: 2 days   Time spent= 35 mins    Burgess JAYSON Dare, MD [facility]  If 7PM-7AM, please contact night-coverage  07/08/2024, 11:13 AM

## 2024-07-08 NOTE — Progress Notes (Signed)
 Mobility Specialist Progress Note:   07/08/24 1038  Mobility  Activity Ambulated with assistance (In hallway)  Level of Assistance Standby assist, set-up cues, supervision of patient - no hands on  Assistive Device None  Distance Ambulated (ft) 250 ft  Activity Response Tolerated well  Mobility Referral Yes  Mobility visit 1 Mobility  Mobility Specialist Start Time (ACUTE ONLY) [id number]  Mobility Specialist Stop Time (ACUTE ONLY) 0958  Mobility Specialist Time Calculation (min) (ACUTE ONLY) 10 min   Received pt in bed and agreeable to mobility. No physical assistance needed. C/o LLE soreness, otherwise tolerated well. Returned to room without fault. Left pt in chair with personal belongings and call light within reach. All needs met.  Lavanda Pollack Mobility Specialist  Please contact via [employer] or  Rehab Office [phone]

## 2024-07-08 NOTE — Progress Notes (Addendum)
  Progress Note    07/08/2024 10:22 AM * No surgery found *  Subjective:  no complaints this morning   Vitals:   07/08/24 0520 07/08/24 0743  BP: (!) 150/61 (!) 164/62  Pulse: 60 (!) 59  Resp:  16  Temp: 97.7 F (36.5 C) 98 F (36.7 C)  SpO2: 97% 98%   Physical Exam: Cardiac:  regular Lungs:  non labored Extremities:  Dorsal left foot wound unchanged Neurologic: alert and oriented  CBC    Component Value Date/Time   WBC 11.1 (H) 07/08/2024 0538   RBC 3.08 (L) 07/08/2024 0538   HGB 9.9 (L) 07/08/2024 0538   HCT 29.7 (L) 07/08/2024 0538   PLT 367 07/08/2024 0538   MCV 96.4 07/08/2024 0538   MCH 32.1 07/08/2024 0538   MCHC 33.3 07/08/2024 0538   RDW 13.2 07/08/2024 0538   LYMPHSABS 1.9 07/08/2024 0538   MONOABS 1.1 (H) 07/08/2024 0538   EOSABS 0.5 07/08/2024 0538   BASOSABS 0.1 07/08/2024 0538    BMET    Component Value Date/Time   NA 138 07/08/2024 0538   K 4.3 07/08/2024 0538   CL 103 07/08/2024 0538   CO2 25 07/08/2024 0538   GLUCOSE 104 (H) 07/08/2024 0538   BUN 16 07/08/2024 0538   CREATININE 1.38 (H) 07/08/2024 0538   CALCIUM  9.2 07/08/2024 0538   GFRNONAA 39 (L) 07/08/2024 0538   GFRAA 38 (L) 06/25/2020 1200    INR    Component Value Date/Time   INR 0.9 07/07/2024 0218     Intake/Output Summary (Last 24 hours) at 07/08/2024 1022 Last data filed at 07/07/2024 1800 Gross per 24 hour  Intake 115.2 ml  Output --  Net 115.2 ml     Assessment/Plan:  78 y.o. female with aortoiliac occlusive disease with left lower ulceration after dog bite  - Aortoiliac duplex shows severe distal aorta and iliac disease bilaterally - Risks/ benefits of procedure including bleeding, arterial injury or dissection, thrombosis, or need for surgical intervention were re discussed with patient  - She is scheduled for Aortogram, Arteriogram of LLE tomorrow in cath lab with Dr. Serene. This will be either from Radial/brachial or femoral approach - Please keep NPO  after midnight - Consent ordered  Teretha Damme, PA-C [facility] [phone] 07/08/2024 10:22 AM  VASCULAR STAFF ADDENDUM: I have independently interviewed and examined the patient. I agree with the above.   Debby SAILOR. Magda, MD [facility] Phone Number: [phone] 07/08/2024 10:49 AM

## 2024-07-09 ENCOUNTER — Encounter (HOSPITAL_COMMUNITY): Payer: Self-pay | Admitting: Internal Medicine

## 2024-07-09 ENCOUNTER — Encounter (HOSPITAL_COMMUNITY)
Admission: EM | Disposition: A | Discharge status: Home / Self Care | Payer: Self-pay | Source: Home / Self Care | Attending: Internal Medicine

## 2024-07-09 DIAGNOSIS — I70245 Atherosclerosis of native arteries of left leg with ulceration of other part of foot: Secondary | ICD-10-CM

## 2024-07-09 DIAGNOSIS — I70202 Unspecified atherosclerosis of native arteries of extremities, left leg: Secondary | ICD-10-CM

## 2024-07-09 DIAGNOSIS — S91059S Open bite, unspecified ankle, sequela: Secondary | ICD-10-CM | POA: Diagnosis not present

## 2024-07-09 DIAGNOSIS — S91052D Open bite, left ankle, subsequent encounter: Secondary | ICD-10-CM

## 2024-07-09 DIAGNOSIS — I700 Atherosclerosis of aorta: Secondary | ICD-10-CM

## 2024-07-09 DIAGNOSIS — S91302A Unspecified open wound, left foot, initial encounter: Secondary | ICD-10-CM

## 2024-07-09 DIAGNOSIS — L97529 Non-pressure chronic ulcer of other part of left foot with unspecified severity: Secondary | ICD-10-CM

## 2024-07-09 DIAGNOSIS — W540XXD Bitten by dog, subsequent encounter: Secondary | ICD-10-CM

## 2024-07-09 DIAGNOSIS — W540XXS Bitten by dog, sequela: Secondary | ICD-10-CM | POA: Diagnosis not present

## 2024-07-09 DIAGNOSIS — I701 Atherosclerosis of renal artery: Secondary | ICD-10-CM

## 2024-07-09 HISTORY — PX: LOWER EXTREMITY ANGIOGRAPHY: CATH118251

## 2024-07-09 HISTORY — PX: PERIPHERAL INTRAVASCULAR LITHOTRIPSY: CATH118324

## 2024-07-09 HISTORY — PX: ABDOMINAL AORTOGRAM W/LOWER EXTREMITY: CATH118223

## 2024-07-09 HISTORY — PX: PERIPHERAL VASCULAR ATHERECTOMY: CATH118256

## 2024-07-09 HISTORY — PX: LOWER EXTREMITY INTERVENTION: CATH118252

## 2024-07-09 LAB — CBC WITH DIFFERENTIAL/PLATELET
Abs Immature Granulocytes: 0.03 K/uL (ref 0.00–0.07)
Basophils Absolute: 0.1 K/uL (ref 0.0–0.1)
Basophils Relative: 1 %
Eosinophils Absolute: 0.4 K/uL (ref 0.0–0.5)
Eosinophils Relative: 4 %
HCT: 26.6 % — ABNORMAL LOW (ref 36.0–46.0)
Hemoglobin: 8.9 g/dL — ABNORMAL LOW (ref 12.0–15.0)
Immature Granulocytes: 0 %
Lymphocytes Relative: 19 %
Lymphs Abs: 1.8 K/uL (ref 0.7–4.0)
MCH: 32.8 pg (ref 26.0–34.0)
MCHC: 33.5 g/dL (ref 30.0–36.0)
MCV: 98.2 fL (ref 80.0–100.0)
Monocytes Absolute: 1 K/uL (ref 0.1–1.0)
Monocytes Relative: 10 %
Neutro Abs: 6.5 K/uL (ref 1.7–7.7)
Neutrophils Relative %: 66 %
Platelets: 316 K/uL (ref 150–400)
RBC: 2.71 MIL/uL — ABNORMAL LOW (ref 3.87–5.11)
RDW: 13.2 % (ref 11.5–15.5)
WBC: 9.8 K/uL (ref 4.0–10.5)
nRBC: 0 % (ref 0.0–0.2)

## 2024-07-09 LAB — POCT ACTIVATED CLOTTING TIME
Activated Clotting Time: 204 s
Activated Clotting Time: 204 s
Activated Clotting Time: 216 s
Activated Clotting Time: 176 s

## 2024-07-09 LAB — BASIC METABOLIC PANEL WITH GFR
Anion gap: 15 (ref 5–15)
BUN: 14 mg/dL (ref 8–23)
CO2: 21 mmol/L — ABNORMAL LOW (ref 22–32)
Calcium: 8.7 mg/dL — ABNORMAL LOW (ref 8.9–10.3)
Chloride: 100 mmol/L (ref 98–111)
Creatinine, Ser: 1.5 mg/dL — ABNORMAL HIGH (ref 0.44–1.00)
GFR, Estimated: 36 mL/min — ABNORMAL LOW (ref >60)
Glucose, Bld: 105 mg/dL — ABNORMAL HIGH (ref 70–99)
Potassium: 4 mmol/L (ref 3.5–5.1)
Sodium: 136 mmol/L (ref 135–145)

## 2024-07-09 LAB — PROCALCITONIN: Procalcitonin: <0.1 ng/mL

## 2024-07-09 LAB — MAGNESIUM: Magnesium: 2 mg/dL (ref 1.7–2.4)

## 2024-07-09 LAB — C-REACTIVE PROTEIN: CRP: <0.5 mg/dL (ref <1.0)

## 2024-07-09 MED ORDER — ASPIRIN 81 MG PO CHEW
CHEWABLE_TABLET | ORAL | Status: AC
  Filled 2024-07-09: qty 1

## 2024-07-09 MED ORDER — MIDAZOLAM HCL 2 MG/2ML IJ SOLN
INTRAMUSCULAR | Status: AC
  Filled 2024-07-09: qty 2

## 2024-07-09 MED ORDER — ATROPINE SULFATE 1 MG/10ML IJ SOSY
PREFILLED_SYRINGE | INTRAMUSCULAR | Status: AC
  Filled 2024-07-09: qty 10

## 2024-07-09 MED ORDER — HYDRALAZINE HCL 20 MG/ML IJ SOLN
INTRAMUSCULAR | Status: DC | PRN
  Administered 2024-07-09 (×2): 10 mg via INTRAVENOUS

## 2024-07-09 MED ORDER — SODIUM CHLORIDE 0.9 % WEIGHT BASED INFUSION
1.0000 mL/kg/h | INTRAVENOUS | Status: AC
  Administered 2024-07-09: 1 mL/kg/h via INTRAVENOUS

## 2024-07-09 MED ORDER — FENTANYL CITRATE (PF) 100 MCG/2ML IJ SOLN
INTRAMUSCULAR | Status: AC
  Filled 2024-07-09: qty 2

## 2024-07-09 MED ORDER — HEPARIN SODIUM (PORCINE) 1000 UNIT/ML IJ SOLN
INTRAMUSCULAR | Status: AC
  Filled 2024-07-09: qty 10

## 2024-07-09 MED ORDER — SODIUM CHLORIDE 0.9 % IV SOLN: INTRAVENOUS | Status: DC

## 2024-07-09 MED ORDER — SODIUM CHLORIDE 0.9% FLUSH: 3.0000 mL | INTRAVENOUS | Status: DC | PRN

## 2024-07-09 MED ORDER — SODIUM CHLORIDE 0.9% FLUSH
3.0000 mL | Freq: Two times a day (BID) | INTRAVENOUS | Status: DC
  Administered 2024-07-10 – 2024-07-13 (×6): 3 mL via INTRAVENOUS

## 2024-07-09 MED ORDER — ASPIRIN 81 MG PO TBEC
81.0000 mg | DELAYED_RELEASE_TABLET | Freq: Every day | ORAL | Status: DC
  Administered 2024-07-10 – 2024-07-13 (×2): 81 mg via ORAL
  Filled 2024-07-09 (×3): qty 1

## 2024-07-09 MED ORDER — ONDANSETRON HCL 4 MG/2ML IJ SOLN
INTRAMUSCULAR | Status: AC
  Filled 2024-07-09: qty 2

## 2024-07-09 MED ORDER — HEPARIN (PORCINE) IN NACL 2000-0.9 UNIT/L-% IV SOLN
INTRAVENOUS | Status: DC | PRN
  Administered 2024-07-09: 1000 mL

## 2024-07-09 MED ORDER — CLOPIDOGREL BISULFATE 300 MG PO TABS
ORAL_TABLET | ORAL | Status: DC | PRN
  Administered 2024-07-09: 300 mg via ORAL

## 2024-07-09 MED ORDER — MIDAZOLAM HCL 2 MG/2ML IJ SOLN
INTRAMUSCULAR | Status: DC | PRN
  Administered 2024-07-09 (×2): 1 mg via INTRAVENOUS
  Administered 2024-07-09: 2 mg via INTRAVENOUS
  Administered 2024-07-09: 1 mg via INTRAVENOUS

## 2024-07-09 MED ORDER — HEPARIN SODIUM (PORCINE) 1000 UNIT/ML IJ SOLN
INTRAMUSCULAR | Status: AC
Start: 2024-07-09 — End: 2024-07-09
  Filled 2024-07-09: qty 10

## 2024-07-09 MED ORDER — SODIUM CHLORIDE 0.9 % IV SOLN: 250.0000 mL | INTRAVENOUS | Status: AC | PRN

## 2024-07-09 MED ORDER — FENTANYL CITRATE (PF) 100 MCG/2ML IJ SOLN
INTRAMUSCULAR | Status: DC | PRN
  Administered 2024-07-09: 50 ug via INTRAVENOUS
  Administered 2024-07-09 (×3): 25 ug via INTRAVENOUS

## 2024-07-09 MED ORDER — HYDRALAZINE HCL 20 MG/ML IJ SOLN
5.0000 mg | INTRAMUSCULAR | Status: DC | PRN
  Filled 2024-07-09: qty 1

## 2024-07-09 MED ORDER — FENTANYL CITRATE (PF) 100 MCG/2ML IJ SOLN
INTRAMUSCULAR | Status: AC
Start: 2024-07-09 — End: 2024-07-09
  Filled 2024-07-09: qty 2

## 2024-07-09 MED ORDER — HEPARIN SODIUM (PORCINE) 1000 UNIT/ML IJ SOLN
INTRAMUSCULAR | Status: DC | PRN
  Administered 2024-07-09: 7000 [IU] via INTRAVENOUS
  Administered 2024-07-09: 3000 [IU] via INTRAVENOUS
  Administered 2024-07-09: 5000 [IU] via INTRAVENOUS

## 2024-07-09 MED ORDER — LIDOCAINE HCL (PF) 1 % IJ SOLN
INTRAMUSCULAR | Status: DC | PRN
  Administered 2024-07-09: 5 mL via INTRADERMAL

## 2024-07-09 MED ORDER — HYDRALAZINE HCL 20 MG/ML IJ SOLN
INTRAMUSCULAR | Status: AC
  Filled 2024-07-09: qty 1

## 2024-07-09 MED ORDER — ASPIRIN 81 MG PO CHEW
CHEWABLE_TABLET | ORAL | Status: DC | PRN
  Administered 2024-07-09: 81 mg via ORAL

## 2024-07-09 MED ORDER — CLOPIDOGREL BISULFATE 300 MG PO TABS
ORAL_TABLET | ORAL | Status: AC
  Filled 2024-07-09: qty 1

## 2024-07-09 MED ORDER — LIDOCAINE HCL (PF) 1 % IJ SOLN
INTRAMUSCULAR | Status: AC
  Filled 2024-07-09: qty 30

## 2024-07-09 MED ORDER — CLOPIDOGREL BISULFATE 75 MG PO TABS
75.0000 mg | ORAL_TABLET | Freq: Every day | ORAL | Status: DC
  Administered 2024-07-10 – 2024-07-13 (×2): 75 mg via ORAL
  Filled 2024-07-09 (×3): qty 1

## 2024-07-09 MED ORDER — IODIXANOL 320 MG/ML IV SOLN
INTRAVENOUS | Status: DC | PRN
  Administered 2024-07-09: 100 mL

## 2024-07-09 MED ORDER — ACETAMINOPHEN 325 MG PO TABS
650.0000 mg | ORAL_TABLET | ORAL | Status: DC | PRN
  Administered 2024-07-11: 650 mg via ORAL

## 2024-07-09 MED ORDER — MELATONIN 3 MG PO TABS
3.0000 mg | ORAL_TABLET | Freq: Every evening | ORAL | Status: DC | PRN
  Administered 2024-07-09 – 2024-07-11 (×3): 3 mg via ORAL
  Filled 2024-07-09 (×5): qty 1

## 2024-07-09 NOTE — Interval H&P Note (Signed)
 History and Physical Interval Note:  07/09/2024 12:15 PM  Taylor Bush  has presented today for surgery, with the diagnosis of PAD with ulcer.  The various methods of treatment have been discussed with the patient and family. After consideration of risks, benefits and other options for treatment, the patient has consented to  Procedure(s): ABDOMINAL AORTOGRAM W/LOWER EXTREMITY (N/A) as a surgical intervention.  The patient's history has been reviewed, patient examined, no change in status, stable for surgery.  I have reviewed the patient's chart and labs.  Questions were answered to the patient's satisfaction.     [name]

## 2024-07-09 NOTE — Op Note (Signed)
 Patient name: [place] Bush MRN: [id number] DOB: [birth year] Sex: female  07/09/2024 Pre-operative Diagnosis: Nonhealing left leg wound Post-operative diagnosis:  Same Surgeon:  [name] Procedure Performed:  1.  Ultrasound-guided access, right femoral artery  2.  Aortobifemoral angiogram  3.  Left leg angiogram  4.  Selective injection with catheter in the left external iliac and left popliteal artery  5.  Laser atherectomy, left superficial femoral and popliteal artery  6.  Shockwave intra-arterial lithotripsy left superficial femoral-popliteal artery  7.  Stent, left superficial femoral and popliteal artery  8.  Conscious sedation, 146 minutes   Indications: This is a 78 year old female with nonhealing wound on her left foot and severe peripheral vascular disease who comes in today for angiography and possible intervention  Procedure:  The patient was identified in the holding area and taken to room 8.  The patient was then placed supine on the table and prepped and draped in the usual sterile fashion.  A time out was called.  Conscious sedation was administered with the use of IV fentanyl  and Versed  under continuous physician and nurse monitoring.  Heart rate, blood pressure, and oxygen saturation were continuously monitored.  Total sedation time was 146 minutes.  Ultrasound was used to evaluate the right common femoral artery.  It was patent .  A digital ultrasound image was acquired.  A micropuncture needle was used to access the right common femoral artery under ultrasound guidance.  An 018 wire was advanced without resistance and a micropuncture sheath was placed.  The 018 wire was removed and a benson wire was placed.  The micropuncture sheath was exchanged for a 5 french sheath.  An omniflush catheter was advanced over the wire to the level of L-1.  An abdominal angiogram was obtained.  Next, using the omniflush catheter and a benson wire, the aortic bifurcation was crossed and the  catheter was placed into theleft external iliac artery and left runoff was obtained.   Findings:   Aortogram: Approximately 60% left renal artery stenosis.  The infrarenal abdominal aorta heavily calcified without hemodynamically significant stenosis.  Bilateral common and external iliac arteries are heavily calcified but patent without significant stenosis.  Bilateral common femoral arteries are heavily calcified  Right Lower Extremity: Not evaluated given creatinine  Left Lower Extremity: The left common femoral artery is heavily calcified without significant stenosis.  The profundofemoral artery is patent without significant stenosis.  The superficial femoral artery is diffusely disease proximally with heavy calcification.  In the mid thigh it occludes with reconstitution of the below-knee popliteal artery.  The dominant runoff is the peroneal artery  Intervention: After the above images were acquired the decision was made to proceed with intervention.  A 6 French 45 cm sheath was advanced into the left external iliac artery.  The patient was fully heparinized.  I performed subintimal recanalization of the occluded popliteal artery.  This was done with an 035 Glidewire and a quick cross catheter.  I ultimately had to switch out to a Glidewire advantage for more support to cross the lesion.  Once this was done it was confirmed with a contrast injection in the below-knee popliteal artery.  I then elected to predilate the lesion.  I was unable to get a 3 mm balloon to go through the lesion behind the patella.  I therefore elected to perform laser atherectomy.  A 2 mm laser catheter was used to perform laser atherectomy of the superficial femoral and popliteal artery.  I  was unable to advance the laser catheter through the short segment calcified artery at the level of the patella.  This artery was circumferentially calcified and highly stenotic.  Next, I used a 3 mm Mustang balloon and this time was able to  get a short 3 mm balloon to cross the lesion.  This was then dilated.  I switched out to an 014 platform and then performed shockwave intra-arterial lithotripsy of the popliteal and superficial femoral artery using a 4 mm balloon distally and a 5 mm more proximally.  800 pulses were delivered.  Because of the heavy calcification I elected to stent the popliteal artery beginning at the joint space extending proximally.  Two 6 x 150 Eluvia stents were placed and postdilated with a 5 mm balloon.  There was residual narrowing within the stents from the calcification and so this was treated again with a 5 mm shockwave balloon.  Completion imaging was then performed.  The patient has inline flow through the superficial femoral popliteal artery with single-vessel runoff via the peroneal artery which collateralizes nicely onto the foot.  At this point I elected to terminate the procedure.  Catheters and wires were removed.  The patient be taken the holding area for sheath pull once her coagulation profile corrects  Impression:  #1  Occluded and heavily circumferentially calcified left superficial femoral and popliteal artery.  This was successfully recanalized.  The artery was treated with laser atherectomy and intra-arterial balloon angioplasty followed by stenting using 6 mm overlapping Eluvia stents  #2  Single-vessel runoff via the peroneal artery  #3  Patient has been optimally revascularized however because of the heavily calcified lesions, I was unable to get full expansion of her stents.  She will need to be maintained on aspirin  and Plavix  in addition to a statin  V. [name], M.D., [facility] [place] Office: [phone] Pager:  [phone]

## 2024-07-09 NOTE — Progress Notes (Signed)
 Presents with aortoiliac occlusive disease with left lower ulceration after dog bite.    07/09/24 0749  TOC Brief Assessment  Insurance and Status Reviewed  Patient has primary care physician Yes  Home environment has been reviewed From home. Supportive son.  Prior level of function: PTA independent with ADL's, no DME usage.  Prior/Current Home Services No current home services  Social Drivers of Health Review SDOH reviewed no interventions necessary  Readmission risk has been reviewed No  Transition of care needs no transition of care needs at this time     Plan: L Aortogram, Arteriogram of LLE today. IPCM following and will assist with needs as they present. Jon Hoit RN,BSN,CM [phone]

## 2024-07-09 NOTE — Progress Notes (Signed)
 Mobility Specialist Progress Note:    07/09/24 1215  Mobility  Activity Ambulated with assistance  Level of Assistance Standby assist, set-up cues, supervision of patient - no hands on  Assistive Device Other (Comment) (IV Pole)  Distance Ambulated (ft) 250 ft  Activity Response Tolerated well  Mobility Referral Yes  Mobility visit 1 Mobility  Mobility Specialist Start Time (ACUTE ONLY) 0930  Mobility Specialist Stop Time (ACUTE ONLY) 1000  Mobility Specialist Time Calculation (min) (ACUTE ONLY) 30 min   Received pt in bed having no complaints and agreeable to mobility. Pt was asymptomatic throughout ambulation and returned to room w/o fault. Left in chair w/ call bell in reach and all needs met.   Thersia Minder Mobility Specialist  Please contact vis Secure Chat or  Rehab Office [phone]

## 2024-07-09 NOTE — Progress Notes (Signed)
 PROGRESS NOTE    [place] Bush  FMW:[id number] DOB: 1946/08/04 DOA: 07/06/2024 PCP: Watt Mirza, MD    Brief Narrative:  78 year old with history of HTN, HLD, PAD, CKD 3B, B12 deficiency comes to the hospital after suffering from left foot dog bite.  She had came to the ER, wound was sutured, received tetanus shot and was discharged on Augmentin .  Returns back today due to worsening of infection.  Due to concerns of gangrenous ulcer on the dorsum side, Dr. Harden was consulted.  Abnormal ABIs therefore vascular consulted.  Aortoiliac duplex suggested severe disease therefore planning on left lower extremity angio later today.   Assessment & Plan:  Principal Problem:   Dog bite of ankle, sequela Active Problems:   Gangrene of left foot (HCC)   Left foot gangrene his ulcer secondary to dog bite - Patient failed outpatient p.o. Augmentin  therefore admitted to the hospital.  Seen by orthopedic Dr. Harden.  ABI and aortoiliac duplex suggestive of severe disease therefore planning on left lower extremity angio/duplex per vascular today. -Already received tetanus shot during initial visit - Pain control, bowel regimen  History of peripheral arterial disease - Abnormal ABIs.  LDL 120, A1c 5.5 - Aspirin  and statin  Essential hypertension, uncontrolled - Norvasc , Toprol -XL.  Add hydralazine  - IV as needed ordered  CKD stage IIIb - Baseline creatinine 1.5.  Continue to monitor renal function  Hypomagnesemia - As needed repletion  Anemia of chronic disease - Suspect baseline hemoglobin around 9.0.  Continue to closely monitor.    DVT prophylaxis: SQ Heparin     Code Status: Full Code Family Communication: Daughter present at bedside Status is: Inpatient Remains inpatient appropriate because: Ongoing management for lower extremity cellulitis   PT Follow up Recs:   Subjective: Seen at bedside.  Awaiting her angiogram.  Daughter present at bedside Examination:  General exam:  Appears calm and comfortable  Respiratory system: Clear to auscultation. Respiratory effort normal. Cardiovascular system: S1 & S2 heard, RRR. No JVD, murmurs, rubs, gallops or clicks. No pedal edema. Gastrointestinal system: Abdomen is nondistended, soft and nontender. No organomegaly or masses felt. Normal bowel sounds heard. Central nervous system: Alert and oriented. No focal neurological deficits. Extremities: Symmetric 5 x 5 power. Skin: Left lower extremity dressing is in place Psychiatry: Judgement and insight appear normal. Mood & affect appropriate.                Diet Orders (From admission, onward)     Start     Ordered   07/09/24 0001  Diet NPO time specified Except for: Sips with Meds  Diet effective midnight       Question:  Except for  Answer:  Sips with Meds   07/08/24 0832            Objective: Vitals:   07/09/24 0524 07/09/24 0526 07/09/24 0836 07/09/24 1207  BP: (!) 163/64 (!) 163/64 (!) 159/57   Pulse: 64  (!) 56   Resp: (!) 22  16   Temp: 98.2 F (36.8 C)  97.9 F (36.6 C)   TempSrc: Oral     SpO2: 98%  97% 96%  Weight:        Intake/Output Summary (Last 24 hours) at 07/09/2024 1235 Last data filed at 07/08/2024 1458 Gross per 24 hour  Intake 424.87 ml  Output --  Net 424.87 ml   Filed Weights   07/06/24 0813  Weight: 67.1 kg    Scheduled Meds:  [MAR Hold] amLODipine   10  mg Oral Daily   [MAR Hold] aspirin  EC  81 mg Oral Daily   [MAR Hold] atorvastatin   80 mg Oral Daily   [MAR Hold] heparin   5,000 Units Subcutaneous Q8H   [MAR Hold] hydrALAZINE   25 mg Oral Q8H   [MAR Hold] metoprolol   200 mg Oral Daily   Continuous Infusions:  sodium chloride  100 mL/hr at 07/09/24 0936   [MAR Hold] meropenem  (MERREM ) IV 1 g (07/09/24 0531)    Nutritional status     Body mass index is 28.89 kg/m.  Data Reviewed:   CBC: Recent Labs  Lab 07/06/24 0939 07/06/24 1257 07/07/24 0218 07/08/24 0538 07/09/24 0608  WBC 11.8* 12.0* 9.2  11.1* 9.8  NEUTROABS 8.6*  --  5.5 7.6 6.5  HGB 9.2* 10.1* 8.4* 9.9* 8.9*  HCT 28.2* 31.0* 25.9* 29.7* 26.6*  MCV 98.6 97.2 97.7 96.4 98.2  PLT 354 387 319 367 316   Basic Metabolic Panel: Recent Labs  Lab 07/06/24 0939 07/06/24 1257 07/07/24 0218 07/08/24 0538 07/09/24 0608  NA 133*  --  135 138 136  K 4.7  --  4.1 4.3 4.0  CL 100  --  106 103 100  CO2 23  --  21* 25 21*  GLUCOSE 103*  --  93 104* 105*  BUN 18  --  19 16 14   CREATININE 1.57* 1.47* 1.49* 1.38* 1.50*  CALCIUM  8.8*  --  8.6* 9.2 8.7*  MG  --   --  1.6* 2.4 2.0  PHOS  --   --   --  3.2  --    GFR: Estimated Creatinine Clearance: 26.8 mL/min (A) (by C-G formula based on SCr of 1.5 mg/dL (H)). Liver Function Tests: Recent Labs  Lab 07/06/24 0939  AST 17  ALT 13  ALKPHOS 53  BILITOT 0.5  PROT 6.1*  ALBUMIN 3.1*   No results for input(s): LIPASE, AMYLASE in the last 168 hours. No results for input(s): AMMONIA in the last 168 hours. Coagulation Profile: Recent Labs  Lab 07/07/24 0218  INR 0.9   Cardiac Enzymes: No results for input(s): CKTOTAL, CKMB, CKMBINDEX, TROPONINI in the last 168 hours. BNP (last 3 results) No results for input(s): PROBNP in the last 8760 hours. HbA1C: Recent Labs    07/07/24 0218  HGBA1C 5.5   CBG: No results for input(s): GLUCAP in the last 168 hours. Lipid Profile: Recent Labs    07/08/24 0538  CHOL 204*  HDL 61  LDLCALC 120*  TRIG 115  CHOLHDL 3.3   Thyroid  Function Tests: No results for input(s): TSH, T4TOTAL, FREET4, T3FREE, THYROIDAB in the last 72 hours. Anemia Panel: No results for input(s): VITAMINB12, FOLATE, FERRITIN, TIBC, IRON , RETICCTPCT in the last 72 hours. Sepsis Labs: Recent Labs  Lab 07/06/24 0944 07/06/24 1257 07/07/24 0218 07/08/24 0538 07/09/24 0608  PROCALCITON  --  <0.10 <0.10 <0.10 <0.10  LATICACIDVEN 0.9  --   --   --   --     Recent Results (from the past 240 hours)  Culture, blood  (Routine X 2) w Reflex to ID Panel     Status: None (Preliminary result)   Collection Time: 07/06/24 12:57 PM   Specimen: BLOOD LEFT ARM  Result Value Ref Range Status   Specimen Description BLOOD LEFT ARM  Final   Special Requests   Final    BOTTLES DRAWN AEROBIC ONLY Blood Culture adequate volume   Culture   Final    NO GROWTH 3 DAYS Performed at [facility]  [facility], [address] [place], [place] [address]    Report Status PENDING  Incomplete         Radiology Studies: VAS US  AORTA/IVC/ILIACS Result Date: 07/08/2024 ABDOMINAL AORTA STUDY Patient Name:  [place] Bush  Date of Exam:   07/08/2024 Medical Rec #: [id number]      Accession #:    [id number] Date of Birth: [birth year]      Patient Gender: F Patient Age:   71 years Exam Location:  [facility] Procedure:      VAS US  AORTA/IVC/ILIACS Referring Phys: DEBBY ROBERTSON --------------------------------------------------------------------------------  Risk Factors: Hypertension, hyperlipidemia, past history of smoking. Other Factors: 07/07/2024 ABI Right 0.70 Left 0.53. Limitations: Air/bowel gas.  Comparison Study: NA Performing Technologist: Orvil Holmes RDCS  Examination Guidelines: A complete evaluation includes B-mode imaging, spectral Doppler, color Doppler, and power Doppler as needed of all accessible portions of each vessel. Bilateral testing is considered an integral part of a complete examination. Limited examinations for reoccurring indications may be performed as noted.  Abdominal Aorta Findings: +-------------+-------+----------+----------+----------+--------+--------+ Location     AP (cm)Trans (cm)PSV (cm/s)Waveform  ThrombusComments +-------------+-------+----------+----------+----------+--------+--------+ Proximal     1.50   1.70      78        triphasic                  +-------------+-------+----------+----------+----------+--------+--------+ Mid          1.50   1.50      74        triphasic                   +-------------+-------+----------+----------+----------+--------+--------+ Distal       1.40   1.60      64        triphasic                  +-------------+-------+----------+----------+----------+--------+--------+ RT CIA Prox  1.0    1.1       231       biphasic                   +-------------+-------+----------+----------+----------+--------+--------+ RT CIA Mid                    216       monophasic                 +-------------+-------+----------+----------+----------+--------+--------+ RT CIA Distal                 187       monophasic                 +-------------+-------+----------+----------+----------+--------+--------+ RT EIA Prox                   180       monophasic                 +-------------+-------+----------+----------+----------+--------+--------+ RT EIA Mid                    126       monophasic                 +-------------+-------+----------+----------+----------+--------+--------+ RT EIA Distal                 130       monophasic                 +-------------+-------+----------+----------+----------+--------+--------+ LT CIA Prox  1.0  1.0       216       biphasic                   +-------------+-------+----------+----------+----------+--------+--------+ LT CIA Mid                    285       biphasic                   +-------------+-------+----------+----------+----------+--------+--------+ LT CIA Distal                 304       monophasic                 +-------------+-------+----------+----------+----------+--------+--------+ LT EIA Prox                   204       biphasic                   +-------------+-------+----------+----------+----------+--------+--------+ LT EIA Mid                    325       biphasic                   +-------------+-------+----------+----------+----------+--------+--------+ LT EIA Distal                 222       monophasic                  +-------------+-------+----------+----------+----------+--------+--------+  Summary: Stenosis: +-------------------+-------------+ Location           Stenosis      +-------------------+-------------+ Right Common Iliac >50% stenosis +-------------------+-------------+ Left Common Iliac  >50% stenosis +-------------------+-------------+ Left External Iliac>50% stenosis +-------------------+-------------+   *See table(s) above for measurements and observations.  Electronically signed by Debby Robertson on 07/08/2024 at 11:33:22 AM.    Final    VAS US  ABI WITH/WO TBI Result Date: 07/08/2024  LOWER EXTREMITY DOPPLER STUDY Patient Name:  [place] Bush  Date of Exam:   07/07/2024 Medical Rec #: [id number]      Accession #:    [id number] Date of Birth: 1946-10-27      Patient Gender: F Patient Age:   74 years Exam Location:  [facility] Procedure:      VAS US  ABI WITH/WO TBI Referring Phys: LAVADA [facility] --------------------------------------------------------------------------------  Indications: Peripheral artery disease. Gangrenous ulcer dorsum of the left foot              status post dog bite 2 weeks ago High Risk Factors: Hypertension, hyperlipidemia, past history of smoking.  Comparison Study: Prior ABI done in 2010 Performing Technologist: Rachel Pellet RVS  Examination Guidelines: A complete evaluation includes at minimum, Doppler waveform signals and systolic blood pressure reading at the level of bilateral brachial, anterior tibial, and posterior tibial arteries, when vessel segments are accessible. Bilateral testing is considered an integral part of a complete examination. Photoelectric Plethysmograph (PPG) waveforms and toe systolic pressure readings are included as required and additional duplex testing as needed. Limited examinations for reoccurring indications may be performed as noted.  ABI Findings: +---------+------------------+-----+----------+--------+ Right    Rt  Pressure (mmHg)IndexWaveform  Comment  +---------+------------------+-----+----------+--------+ Brachial 171                    triphasic          +---------+------------------+-----+----------+--------+ PTA      85  0.50 monophasic         +---------+------------------+-----+----------+--------+ DP       119               0.70 monophasic         +---------+------------------+-----+----------+--------+ Great Toe39                0.23 Abnormal           +---------+------------------+-----+----------+--------+ +---------+------------------+-----+----------+-------+ Left     Lt Pressure (mmHg)IndexWaveform  Comment +---------+------------------+-----+----------+-------+ Brachial 164                    triphasic         +---------+------------------+-----+----------+-------+ PTA      90                0.53 monophasic        +---------+------------------+-----+----------+-------+ DP       85                0.50 monophasic        +---------+------------------+-----+----------+-------+ Great Toe0                 0.00 Absent            +---------+------------------+-----+----------+-------+ +-------+-----------+-----------+------------+------------+ ABI/TBIToday's ABIToday's TBIPrevious ABIPrevious TBI +-------+-----------+-----------+------------+------------+ Right  0.70       0.23       0.72                     +-------+-----------+-----------+------------+------------+ Left   0.53       absent     0.73                     +-------+-----------+-----------+------------+------------+  Summary: Right: Resting right ankle-brachial index indicates moderate right lower extremity arterial disease. The right toe-brachial index is abnormal.  Left: Resting left ankle-brachial index indicates moderate to severe left lower extremity arterial disease. The left toe-brachial index is abnormal.  *See table(s) above for measurements and  observations.  Electronically signed by Debby Robertson on 07/08/2024 at 11:28:26 AM.    Final            LOS: 3 days   Time spent= 35 mins    Burgess JAYSON Dare, MD [facility]  If 7PM-7AM, please contact night-coverage  07/09/2024, 12:35 PM

## 2024-07-09 NOTE — Plan of Care (Signed)
   Problem: Activity: Goal: Risk for activity intolerance will decrease Outcome: Progressing   Problem: Coping: Goal: Level of anxiety will decrease Outcome: Progressing   Problem: Pain Managment: Goal: General experience of comfort will improve and/or be controlled Outcome: Progressing

## 2024-07-09 NOTE — Progress Notes (Signed)
 Site area: Right femoral Site Prior to Removal:  Level 0 Pressure Applied For:40 minutes Manual:  Yes  Patient Status During Pull:  Stable Post Pull Site:  Level 0 Post Pull Instructions Given: Yes   Post Pull Pulses Present: Right DP and Left PT Doppler Dressing Applied:  gauze and tegaderm Bedrest begins @ 1930 Comments:

## 2024-07-10 ENCOUNTER — Encounter (HOSPITAL_COMMUNITY): Payer: Self-pay | Admitting: Surgery

## 2024-07-10 DIAGNOSIS — S91059S Open bite, unspecified ankle, sequela: Secondary | ICD-10-CM | POA: Diagnosis not present

## 2024-07-10 DIAGNOSIS — Z9889 Other specified postprocedural states: Secondary | ICD-10-CM

## 2024-07-10 DIAGNOSIS — Z95828 Presence of other vascular implants and grafts: Secondary | ICD-10-CM

## 2024-07-10 DIAGNOSIS — I739 Peripheral vascular disease, unspecified: Secondary | ICD-10-CM

## 2024-07-10 DIAGNOSIS — W540XXS Bitten by dog, sequela: Secondary | ICD-10-CM | POA: Diagnosis not present

## 2024-07-10 DIAGNOSIS — I70222 Atherosclerosis of native arteries of extremities with rest pain, left leg: Secondary | ICD-10-CM

## 2024-07-10 LAB — BASIC METABOLIC PANEL WITH GFR
Anion gap: 11 (ref 5–15)
BUN: 19 mg/dL (ref 8–23)
CO2: 20 mmol/L — ABNORMAL LOW (ref 22–32)
Calcium: 8.1 mg/dL — ABNORMAL LOW (ref 8.9–10.3)
Chloride: 106 mmol/L (ref 98–111)
Creatinine, Ser: 1.61 mg/dL — ABNORMAL HIGH (ref 0.44–1.00)
GFR, Estimated: 33 mL/min — ABNORMAL LOW (ref >60)
Glucose, Bld: 120 mg/dL — ABNORMAL HIGH (ref 70–99)
Potassium: 4.4 mmol/L (ref 3.5–5.1)
Sodium: 137 mmol/L (ref 135–145)

## 2024-07-10 LAB — CBC WITH DIFFERENTIAL/PLATELET
Abs Immature Granulocytes: 0.09 K/uL — ABNORMAL HIGH (ref 0.00–0.07)
Basophils Absolute: 0 K/uL (ref 0.0–0.1)
Basophils Relative: 0 %
Eosinophils Absolute: 0.1 K/uL (ref 0.0–0.5)
Eosinophils Relative: 0 %
HCT: 20.9 % — ABNORMAL LOW (ref 36.0–46.0)
Hemoglobin: 6.9 g/dL — CL (ref 12.0–15.0)
Immature Granulocytes: 1 %
Lymphocytes Relative: 5 %
Lymphs Abs: 0.8 K/uL (ref 0.7–4.0)
MCH: 32.9 pg (ref 26.0–34.0)
MCHC: 33 g/dL (ref 30.0–36.0)
MCV: 99.5 fL (ref 80.0–100.0)
Monocytes Absolute: 0.9 K/uL (ref 0.1–1.0)
Monocytes Relative: 6 %
Neutro Abs: 12.1 K/uL — ABNORMAL HIGH (ref 1.7–7.7)
Neutrophils Relative %: 88 %
Platelets: 258 K/uL (ref 150–400)
RBC: 2.1 MIL/uL — ABNORMAL LOW (ref 3.87–5.11)
RDW: 13.2 % (ref 11.5–15.5)
WBC: 13.9 K/uL — ABNORMAL HIGH (ref 4.0–10.5)
nRBC: 0 % (ref 0.0–0.2)

## 2024-07-10 LAB — PROCALCITONIN: Procalcitonin: 0.29 ng/mL

## 2024-07-10 LAB — LIPID PANEL
Cholesterol: 129 mg/dL (ref 0–200)
HDL: 41 mg/dL (ref >40)
LDL Cholesterol: 63 mg/dL (ref 0–99)
Total CHOL/HDL Ratio: 3.1 ratio
Triglycerides: 123 mg/dL (ref <150)
VLDL: 25 mg/dL (ref 0–40)

## 2024-07-10 LAB — POCT ACTIVATED CLOTTING TIME
Activated Clotting Time: 187 s
Activated Clotting Time: 228 s
Activated Clotting Time: 245 s
Activated Clotting Time: 250 s

## 2024-07-10 LAB — C-REACTIVE PROTEIN: CRP: 0.8 mg/dL (ref <1.0)

## 2024-07-10 LAB — MAGNESIUM: Magnesium: 1.8 mg/dL (ref 1.7–2.4)

## 2024-07-10 LAB — HEMOGLOBIN AND HEMATOCRIT, BLOOD
HCT: 25 % — ABNORMAL LOW (ref 36.0–46.0)
Hemoglobin: 8.2 g/dL — ABNORMAL LOW (ref 12.0–15.0)

## 2024-07-10 LAB — GLUCOSE, CAPILLARY: Glucose-Capillary: 133 mg/dL — ABNORMAL HIGH (ref 70–99)

## 2024-07-10 LAB — PREPARE RBC (CROSSMATCH)

## 2024-07-10 MED ORDER — SODIUM CHLORIDE 0.9% IV SOLUTION: Freq: Once | INTRAVENOUS | Status: AC

## 2024-07-10 NOTE — Consult Note (Signed)
 WOC Nurse Consult Note: patient with L dorsal foot wound from dog bite, PAD, has been evaluated by Dr. Harden who plans to manage wound after vascular procedure Underwent L leg angiogram with stent placement 07/09/2024  Reason for Consult: L foot wound  Wound type: full thickness necrotic r/t dog bite  Pressure Injury POA: NA  Measurement: see nursing flowsheet  Wound bed: 100% black tan necrotic  Drainage (amount, consistency, odor) see nursing flowsheet  Periwound: mild erythema  Dressing procedure/placement/frequency: Cleanse L dorsal foot wound with Vashe wound cleanser Soila [phone]) do not rinse and allow to air dry.  Apply Vashe moistened gauze to wound bed 2 times daily, cover with dry gauze and Kerlix roll gauze.   Dr. Harden has been consulted and will be managing this wound. Any wound care orders placed by Dr. Harden supercede wound care orders placed by [facility] nurse.   WOC team will not follow. Re-consult if further needs arise.   Thank you,    Powell Bar MSN, RN-BC, [employer]

## 2024-07-10 NOTE — Progress Notes (Addendum)
  Progress Note    07/10/2024 8:22 AM 1 Day Post-Op  Subjective: She says her right groin feels a little bit sore   Vitals:   07/10/24 0531 07/10/24 0754  BP:  (!) 126/52  Pulse: 62 74  Resp: 19 16  Temp:  98 F (36.7 C)  SpO2: 94% 99%    Physical Exam: General: Resting, alert and oriented x 3 Cardiac: Regular Lungs: Nonlabored Incisions: Right groin cath site soft without hematoma Extremities: Left foot bandaged and dry.  Brisk left AT and PT Doppler signals at the ankle   CBC    Component Value Date/Time   WBC 13.9 (H) 07/10/2024 0431   RBC 2.10 (L) 07/10/2024 0431   HGB 6.9 (LL) 07/10/2024 0431   HCT 20.9 (L) 07/10/2024 0431   PLT 258 07/10/2024 0431   MCV 99.5 07/10/2024 0431   MCH 32.9 07/10/2024 0431   MCHC 33.0 07/10/2024 0431   RDW 13.2 07/10/2024 0431   LYMPHSABS 0.8 07/10/2024 0431   MONOABS 0.9 07/10/2024 0431   EOSABS 0.1 07/10/2024 0431   BASOSABS 0.0 07/10/2024 0431    BMET    Component Value Date/Time   NA 137 07/10/2024 0431   K 4.4 07/10/2024 0431   CL 106 07/10/2024 0431   CO2 20 (L) 07/10/2024 0431   GLUCOSE 120 (H) 07/10/2024 0431   BUN 19 07/10/2024 0431   CREATININE 1.61 (H) 07/10/2024 0431   CALCIUM  8.1 (L) 07/10/2024 0431   GFRNONAA 33 (L) 07/10/2024 0431   GFRAA 38 (L) 06/25/2020 1200    INR    Component Value Date/Time   INR 0.9 07/07/2024 0218     Intake/Output Summary (Last 24 hours) at 07/10/2024 9177 Last data filed at 07/10/2024 0754 Gross per 24 hour  Intake 1474.75 ml  Output 400 ml  Net 1074.75 ml      Assessment/Plan:  78 y.o. female is 1 day postop, s/p: Left SFA and popliteal artery laser atherectomy, shockwave lithotripsy, and stenting   - The patient underwent left lower extremity angiogram with significant SFA/pop revascularization for critical limb ischemia with a nonhealing left foot wound -Right groin cath site is intact and soft this morning without hematoma -Her left lower extremity is currently  maximally revascularized.  She has brisk left AT and PT Doppler signals at the ankle. -She is cleared from a vascular standpoint to undergo left foot ulceration debridement with orthopedics -She should continue a daily aspirin , Plavix , and statin.  Will arrange follow-up with our office in 4 to 6 weeks with left lower extremity arterial duplex and ABIs   Ahmed Holster, PA-C [facility] [phone] 07/10/2024 8:22 AM  I agree with the above.  She will need to be maintained on aspirin , Plavix , and a statin.  [name]

## 2024-07-10 NOTE — Progress Notes (Signed)
 Overnight floor coverage  Informed by RN regarding critical hemoglobin of 6.9 on morning labs today.  Hemoglobin was 8.9 yesterday.  Patient underwent vascular procedure yesterday, and, per RN, had a hematoma at the right femoral access site which was noted after the procedure yesterday but at present there is only mild bruising at the site and no active bleeding.  Patient is not reporting any pain.  Her blood pressure was soft earlier during the night but now improved.  Not tachycardic.  Type and screen, 1 unit PRBCs ordered after obtaining consent from the patient.  Follow-up posttransfusion H&H and continue to transfuse if hemoglobin is less than 7.

## 2024-07-10 NOTE — Progress Notes (Signed)
 This RN responded to call bell for pt in holding area at approximately 1830. Pt stating that she doesn't feel well, unable to elaborate on specifics. Noted HR in 40s; cycled BP at this time while assessing arterial access site (sheath still in place). No bleeding noted on gauze or around incision site; palpation of site reveals large hematoma. BP 68/42(52). Manual pressure immediately applied to hematoma and approximate location of arteriotomy. Pt placed in trendelenberg by colleague and IV fluid bolus given (~500cc given total in subsequent hour). Dr Pearline paged (on-call [facility]VS). Hemostasis achieved after hold; BP stabilized (see flowsheets). Dr Pearline returned page at this time; was notified of complication, return to stable disposition, and will be notified of any further complications.  Sheath pull performed by Marcelline RN with no complication (see note) with this RN at bedside in case of emergency. Pt kept in cath lab holding area for additional groin assessment/observation before being taken to 6East room. Arterial access site and hemodynamics remain stable/consistent. Site handoff given to Passenger transport manager ([address] [place]) by Marcelline RN (cath holding).

## 2024-07-10 NOTE — Plan of Care (Signed)
  Problem: Health Behavior/Discharge Planning: Goal: Ability to manage health-related needs will improve Outcome: Progressing   Problem: Clinical Measurements: Goal: Respiratory complications will improve Outcome: Progressing   Problem: Pain Managment: Goal: General experience of comfort will improve and/or be controlled Outcome: Progressing   Problem: Safety: Goal: Ability to remain free from injury will improve Outcome: Progressing   Problem: Cardiovascular: Goal: Vascular access site(s) Level 0-1 will be maintained Outcome: Progressing

## 2024-07-10 NOTE — Progress Notes (Signed)
   07/10/24 0531  Provider Notification  Provider Name/Title Dr Alfornia  Date Provider Notified 07/10/24  Time Provider Notified 0530  Method of Notification Page  Notification Reason Critical Result  Test performed and critical result Hemoglobin 6.9  Date Critical Result Received 07/10/24  Time Critical Result Received 0527  Provider response See new orders  Date of Provider Response 07/10/24  Time of Provider Response [phone]

## 2024-07-10 NOTE — Progress Notes (Signed)
 Physical Therapy Treatment Patient Details Name: Taylor Bush MRN: [id number] DOB: 1946-07-27 Today's Date: 07/10/2024   History of Present Illness Pt is a 78 yo female admitted to [facility] on 8/30 due to poorly healing dog bite to anterior side of Left foot sustained approximately 2 weeks ago that has now developed into a necrotic ulcer. S/p L SFA and popliteal artery laser atherectomy and stenting 9/2. PMH: HTN, HLD, PAD, CKD 3B, B12 deficiency    PT Comments  Pt agreeable to session, able to complete hallway ambulation without UE support or instability this morning. Pt reports more pain in L foot this session, but was able to complete all transfers and gait without UE support or instability. Reports she was told she will be heel-wt bearing after surgery Friday, and therefore will check back after surgery to complete gait training and further DME assessment. Pt safe to mobilize with staff until procedure.     If plan is discharge home, recommend the following:     Can travel by private vehicle        Equipment Recommendations  None recommended by PT    Recommendations for Other Services       Precautions / Restrictions Precautions Precautions: Fall Recall of Precautions/Restrictions: Intact Restrictions Weight Bearing Restrictions Per Provider Order: No     Mobility  Bed Mobility Overal bed mobility: Modified Independent             General bed mobility comments: increased time for line management    Transfers Overall transfer level: Independent Equipment used: None               General transfer comment: independent, no instability    Ambulation/Gait Ambulation/Gait assistance: Supervision Gait Distance (Feet): 200 Feet Assistive device: None Gait Pattern/deviations: Decreased stance time - left, Antalgic Gait velocity: decreased Gait velocity interpretation: <1.31 ft/sec, indicative of household ambulator   General Gait Details: antalgic gait with limited  wt on L foot but no instability or change in pain. no need for UE support     Balance Overall balance assessment: No apparent balance deficits (not formally assessed) (stable with balance testing without DME)                                          Communication Communication Communication: No apparent difficulties  Cognition Arousal: Alert Behavior During Therapy: WFL for tasks assessed/performed   PT - Cognitive impairments: No apparent impairments                       PT - Cognition Comments: not formally assessed, but functional throughout session Following commands: Intact      Cueing Cueing Techniques: Verbal cues  Exercises      General Comments General comments (skin integrity, edema, etc.): groin site clean, dry, and intact after mobility      Pertinent Vitals/Pain Pain Assessment Pain Assessment: 0-10 Pain Score: 4  Pain Location: foot Pain Descriptors / Indicators: Discomfort, Sore Pain Intervention(s): Limited activity within patient's tolerance, Monitored during session, Repositioned     PT Goals (current goals can now be found in the care plan section) Acute Rehab PT Goals Patient Stated Goal: to return home PT Goal Formulation: With patient Time For Goal Achievement: 07/21/24 Potential to Achieve Goals: Good Progress towards PT goals: Progressing toward goals    Frequency    Min 1X/week  AM-PAC PT 6 Clicks Mobility   Outcome Measure  Help needed turning from your back to your side while in a flat bed without using bedrails?: None Help needed moving from lying on your back to sitting on the side of a flat bed without using bedrails?: None Help needed moving to and from a bed to a chair (including a wheelchair)?: None Help needed standing up from a chair using your arms (e.g., wheelchair or bedside chair)?: None Help needed to walk in hospital room?: None Help needed climbing 3-5 steps with a railing? :  None 6 Click Score: 24    End of Session Equipment Utilized During Treatment: Gait belt Activity Tolerance: Patient tolerated treatment well Patient left: with call bell/phone within reach;in chair;with family/visitor present Nurse Communication: Mobility status PT Visit Diagnosis: Unsteadiness on feet (R26.81)     Time: 1020-1039 PT Time Calculation (min) (ACUTE ONLY): 19 min  Charges:    $Therapeutic Exercise: 8-22 mins PT General Charges $$ ACUTE PT VISIT: 1 Visit                     Izetta Call, PT, DPT   Acute Rehabilitation Department Office [phone] Secure Chat Communication Preferred   Izetta JULIANNA Call 07/10/2024, 12:54 PM

## 2024-07-10 NOTE — Progress Notes (Addendum)
 [facility]                                                                               Taylor Bush, is a 78 y.o. female, DOB - 1946/10/09, FMW:[id number] Admit date - 07/06/2024    Outpatient Primary MD for the patient is Bush, Jacques, MD  LOS - 4  days    Brief summary  78 year old with history of HTN, HLD, PAD, CKD 3B, B12 deficiency comes to the hospital after suffering from left foot dog bite. She had came to the ER, wound was sutured, received tetanus shot and was discharged on Augmentin . Returns back today due to worsening of infection. Due to concerns of gangrenous ulcer on the dorsum side, Dr. Harden was consulted. Abnormal ABIs therefore vascular consulted. Aortoiliac duplex suggested severe disease underwent  left lower extremity angiogram, s/p SFA pop revascularization.    Assessment & Plan    Assessment and Plan:   Non healing left foot wound  S/p Left SFA and popliteal artery laser atherectomy , shockwave lithotripsy and stent placement.  Orthopedics on board and and plan for OR on Friday.  Pain control.  Bowel regimen in place.     H/o PAD Abnormal ABI'S LDL is 120.  Continue with aspirin  and statin.    Hypertension Well controlled.    Stage 3b CKD Creatinine at baseline    Hypomagnesemia Replaced.    Acute anemia of blood loss from the hematoma/ superimposed on anemia of chronic disease Hemoglobin dropped to 6.9 this morning, s/p 1 unit of prbc transfusion.  Recheck hemoglobin later today.      Estimated body mass index is 28.89 kg/m as calculated from the following:   Height as of 06/17/24: 5' (1.524 m).   Weight as of this encounter: 67.1 kg.  Code Status:  full code.  DVT Prophylaxis:  heparin  injection 5,000 Units Start: 07/06/24 1400   Level of Care: Level of care: Progressive Cardiac Family Communication: family at bedside.   Disposition Plan:     Remains inpatient appropriate:  Pending   Procedures:  S/p  SFA/POP revascularization.   Consultants:   Vascular surgery Orthopedics Antimicrobials:   Anti-infectives (From admission, onward)    Start     Dose/Rate Route Frequency Ordered Stop   07/07/24 1600  meropenem  (MERREM ) 1 g in sodium chloride  0.9 % 100 mL IVPB        1 g 200 mL/hr over 30 Minutes Intravenous Every 12 hours 07/07/24 1435     07/06/24 1200  meropenem  (MERREM ) 500 mg in sodium chloride  0.9 % 100 mL IVPB  Status:  Discontinued        500 mg 200 mL/hr over 30 Minutes Intravenous Every 12 hours 07/06/24 1157 07/07/24 1435   07/06/24 1130  vancomycin  (VANCOREADY) IVPB 1250 mg/250 mL        1,250 mg 166.7 mL/hr over 90 Minutes Intravenous  Once 07/06/24 1120 07/06/24 1700   07/06/24 1045  cefTRIAXone (ROCEPHIN) 1 g in sodium chloride  0.9 % 100 mL IVPB  Status:  Discontinued        1 g 200 mL/hr over 30 Minutes Intravenous  Once 07/06/24 1031 07/06/24 1157  Medications  Scheduled Meds:  amLODipine   10 mg Oral Daily   aspirin  EC  81 mg Oral Daily   atorvastatin   80 mg Oral Daily   clopidogrel   75 mg Oral Q breakfast   heparin   5,000 Units Subcutaneous Q8H   hydrALAZINE   25 mg Oral Q8H   metoprolol   200 mg Oral Daily   sodium chloride  flush  3 mL Intravenous Q12H   Continuous Infusions:  sodium chloride  100 mL/hr at 07/10/24 1232   sodium chloride      meropenem  (MERREM ) IV 1 g (07/10/24 0457)   PRN Meds:.sodium chloride , acetaminophen  **OR** acetaminophen , acetaminophen , artificial tears, glucagon  (human recombinant), guaiFENesin , hydrALAZINE , hydrALAZINE , ipratropium-albuterol , melatonin, metoprolol  tartrate, ondansetron  **OR** ondansetron  (ZOFRAN ) IV, senna-docusate, sodium chloride  flush, traMADol     Subjective:   Taylor Bush was seen and examined today.  No new complaints.   Objective:   Vitals:   07/10/24 0900 07/10/24 0912 07/10/24 1022 07/10/24 1212  BP:  (!) 107/41 (!) 127/48 (!) 127/54  Pulse: 62 62 76 66  Resp: 19 16  16   Temp:  97.7 F  (36.5 C)  98 F (36.7 C)  TempSrc:  (P) Oral  Oral  SpO2: 96% 97%  99%  Weight:        Intake/Output Summary (Last 24 hours) at 07/10/2024 1338 Last data filed at 07/10/2024 1212 Gross per 24 hour  Intake 2699.75 ml  Output 400 ml  Net 2299.75 ml   Filed Weights   07/06/24 0813  Weight: 67.1 kg     Exam General exam: Appears calm and comfortable  Respiratory system: Clear to auscultation. Respiratory effort normal. Cardiovascular system: S1 & S2 heard, RRR.  Gastrointestinal system: Abdomen is nondistended, soft and nontender.  Central nervous system: Alert and oriented.  Extremities: left foot wound bandaged, right groin area clean Skin: No rashes,  Psychiatry: Mood & affect appropriate.     Data Reviewed:  I have personally reviewed following labs and imaging studies   CBC Lab Results  Component Value Date   WBC 13.9 (H) 07/10/2024   RBC 2.10 (L) 07/10/2024   HGB 6.9 (LL) 07/10/2024   HCT 20.9 (L) 07/10/2024   MCV 99.5 07/10/2024   MCH 32.9 07/10/2024   PLT 258 07/10/2024   MCHC 33.0 07/10/2024   RDW 13.2 07/10/2024   LYMPHSABS 0.8 07/10/2024   MONOABS 0.9 07/10/2024   EOSABS 0.1 07/10/2024   BASOSABS 0.0 07/10/2024     Last metabolic panel Lab Results  Component Value Date   NA 137 07/10/2024   K 4.4 07/10/2024   CL 106 07/10/2024   CO2 20 (L) 07/10/2024   BUN 19 07/10/2024   CREATININE 1.61 (H) 07/10/2024   GLUCOSE 120 (H) 07/10/2024   GFRNONAA 33 (L) 07/10/2024   GFRAA 38 (L) 06/25/2020   CALCIUM  8.1 (L) 07/10/2024   PHOS 3.2 07/08/2024   PROT 6.1 (L) 07/06/2024   ALBUMIN 3.1 (L) 07/06/2024   BILITOT 0.5 07/06/2024   ALKPHOS 53 07/06/2024   AST 17 07/06/2024   ALT 13 07/06/2024   ANIONGAP 11 07/10/2024    CBG (last 3)  Recent Labs    07/10/24 0027  GLUCAP 133*      Coagulation Profile: Recent Labs  Lab 07/07/24 0218  INR 0.9     Radiology Studies: PERIPHERAL VASCULAR CATHETERIZATION Result Date: 07/09/2024 Images from the  original result were not included. Patient name: Taylor Bush MRN: [id number] DOB: [birth year] Sex: female 07/09/2024 Pre-operative Diagnosis: Nonhealing left leg wound Post-operative  diagnosis:  Same Surgeon:  Taylor Bush New Procedure Performed:  1.  Ultrasound-guided access, right femoral artery  2.  Aortobifemoral angiogram  3.  Left leg angiogram  4.  Selective injection with catheter in the left external iliac and left popliteal artery  5.  Laser atherectomy, left superficial femoral and popliteal artery  6.  Shockwave intra-arterial lithotripsy left superficial femoral-popliteal artery  7.  Stent, left superficial femoral and popliteal artery  8.  Conscious sedation, 146 minutes Indications: This is a 78 year old female with nonhealing wound on her left foot and severe peripheral vascular disease who comes in today for angiography and possible intervention Procedure:  The patient was identified in the holding area and taken to room 8.  The patient was then placed supine on the table and prepped and draped in the usual sterile fashion.  A time out was called.  Conscious sedation was administered with the use of IV fentanyl  and Versed  under continuous physician and nurse monitoring.  Heart rate, blood pressure, and oxygen saturation were continuously monitored.  Total sedation time was 146 minutes.  Ultrasound was used to evaluate the right common femoral artery.  It was patent .  A digital ultrasound image was acquired.  A micropuncture needle was used to access the right common femoral artery under ultrasound guidance.  An 018 wire was advanced without resistance and a micropuncture sheath was placed.  The 018 wire was removed and a benson wire was placed.  The micropuncture sheath was exchanged for a 5 french sheath.  An omniflush catheter was advanced over the wire to the level of L-1.  An abdominal angiogram was obtained.  Next, using the omniflush catheter and a benson wire, the aortic bifurcation was crossed  and the catheter was placed into theleft external iliac artery and left runoff was obtained. Findings:  Aortogram: Approximately 60% left renal artery stenosis.  The infrarenal abdominal aorta heavily calcified without hemodynamically significant stenosis.  Bilateral common and external iliac arteries are heavily calcified but patent without significant stenosis.  Bilateral common femoral arteries are heavily calcified  Right Lower Extremity: Not evaluated given creatinine  Left Lower Extremity: The left common femoral artery is heavily calcified without significant stenosis.  The profundofemoral artery is patent without significant stenosis.  The superficial femoral artery is diffusely disease proximally with heavy calcification.  In the mid thigh it occludes with reconstitution of the below-knee popliteal artery.  The dominant runoff is the peroneal artery Intervention: After the above images were acquired the decision was made to proceed with intervention.  A 6 French 45 cm sheath was advanced into the left external iliac artery.  The patient was fully heparinized.  I performed subintimal recanalization of the occluded popliteal artery.  This was done with an 035 Glidewire and a quick cross catheter.  I ultimately had to switch out to a Glidewire advantage for more support to cross the lesion.  Once this was done it was confirmed with a contrast injection in the below-knee popliteal artery.  I then elected to predilate the lesion.  I was unable to get a 3 mm balloon to go through the lesion behind the patella.  I therefore elected to perform laser atherectomy.  A 2 mm laser catheter was used to perform laser atherectomy of the superficial femoral and popliteal artery.  I was unable to advance the laser catheter through the short segment calcified artery at the level of the patella.  This artery was circumferentially calcified and highly stenotic.  Next, I used a 3 mm Mustang balloon and this time was able to get  a short 3 mm balloon to cross the lesion.  This was then dilated.  I switched out to an 014 platform and then performed shockwave intra-arterial lithotripsy of the popliteal and superficial femoral artery using a 4 mm balloon distally and a 5 mm more proximally.  800 pulses were delivered.  Because of the heavy calcification I elected to stent the popliteal artery beginning at the joint space extending proximally.  Two 6 x 150 Eluvia stents were placed and postdilated with a 5 mm balloon.  There was residual narrowing within the stents from the calcification and so this was treated again with a 5 mm shockwave balloon.  Completion imaging was then performed.  The patient has inline flow through the superficial femoral popliteal artery with single-vessel runoff via the peroneal artery which collateralizes nicely onto the foot.  At this point I elected to terminate the procedure.  Catheters and wires were removed.  The patient be taken the holding area for sheath pull once her coagulation profile corrects Impression:  #1  Occluded and heavily circumferentially calcified left superficial femoral and popliteal artery.  This was successfully recanalized.  The artery was treated with laser atherectomy and intra-arterial balloon angioplasty followed by stenting using 6 mm overlapping Eluvia stents  #2  Single-vessel runoff via the peroneal artery  #3  Patient has been optimally revascularized however because of the heavily calcified lesions, I was unable to get full expansion of her stents.  She will need to be maintained on aspirin  and Plavix  in addition to a statin V. Taylor Bush New, M.D., [facility] [place] Office: [phone] Pager:  [phone]       Elgie Butter M.D. [facility] 07/10/2024, 1:38 PM  Available via Epic secure chat 7am-7pm After 7 pm, please refer to night coverage provider listed on amion.

## 2024-07-10 NOTE — Plan of Care (Signed)

## 2024-07-11 DIAGNOSIS — L03116 Cellulitis of left lower limb: Secondary | ICD-10-CM | POA: Diagnosis not present

## 2024-07-11 DIAGNOSIS — I739 Peripheral vascular disease, unspecified: Secondary | ICD-10-CM

## 2024-07-11 DIAGNOSIS — S91059S Open bite, unspecified ankle, sequela: Secondary | ICD-10-CM | POA: Diagnosis not present

## 2024-07-11 DIAGNOSIS — W540XXS Bitten by dog, sequela: Secondary | ICD-10-CM | POA: Diagnosis not present

## 2024-07-11 LAB — CBC WITH DIFFERENTIAL/PLATELET
Abs Immature Granulocytes: 0.1 K/uL — ABNORMAL HIGH (ref 0.00–0.07)
Basophils Absolute: 0.1 K/uL (ref 0.0–0.1)
Basophils Relative: 0 %
Eosinophils Absolute: 0.5 K/uL (ref 0.0–0.5)
Eosinophils Relative: 4 %
HCT: 24.4 % — ABNORMAL LOW (ref 36.0–46.0)
Hemoglobin: 7.9 g/dL — ABNORMAL LOW (ref 12.0–15.0)
Immature Granulocytes: 1 %
Lymphocytes Relative: 10 %
Lymphs Abs: 1.4 K/uL (ref 0.7–4.0)
MCH: 31.2 pg (ref 26.0–34.0)
MCHC: 32.4 g/dL (ref 30.0–36.0)
MCV: 96.4 fL (ref 80.0–100.0)
Monocytes Absolute: 1.5 K/uL — ABNORMAL HIGH (ref 0.1–1.0)
Monocytes Relative: 11 %
Neutro Abs: 10.1 K/uL — ABNORMAL HIGH (ref 1.7–7.7)
Neutrophils Relative %: 74 %
Platelets: 220 K/uL (ref 150–400)
RBC: 2.53 MIL/uL — ABNORMAL LOW (ref 3.87–5.11)
RDW: 15.6 % — ABNORMAL HIGH (ref 11.5–15.5)
WBC: 13.6 K/uL — ABNORMAL HIGH (ref 4.0–10.5)
nRBC: 0 % (ref 0.0–0.2)

## 2024-07-11 LAB — CBC
HCT: 19.4 % — ABNORMAL LOW (ref 36.0–46.0)
Hemoglobin: 6.4 g/dL — CL (ref 12.0–15.0)
MCH: 31.4 pg (ref 26.0–34.0)
MCHC: 33 g/dL (ref 30.0–36.0)
MCV: 95.1 fL (ref 80.0–100.0)
Platelets: 181 K/uL (ref 150–400)
RBC: 2.04 MIL/uL — ABNORMAL LOW (ref 3.87–5.11)
RDW: 15.8 % — ABNORMAL HIGH (ref 11.5–15.5)
WBC: 12 K/uL — ABNORMAL HIGH (ref 4.0–10.5)
nRBC: 0 % (ref 0.0–0.2)

## 2024-07-11 LAB — BASIC METABOLIC PANEL WITH GFR
Anion gap: 13 (ref 5–15)
Anion gap: 8 (ref 5–15)
BUN: 17 mg/dL (ref 8–23)
BUN: 19 mg/dL (ref 8–23)
CO2: 16 mmol/L — ABNORMAL LOW (ref 22–32)
CO2: 23 mmol/L (ref 22–32)
Calcium: 6 mg/dL — CL (ref 8.9–10.3)
Calcium: 8.5 mg/dL — ABNORMAL LOW (ref 8.9–10.3)
Chloride: 118 mmol/L — ABNORMAL HIGH (ref 98–111)
Chloride: 107 mmol/L (ref 98–111)
Creatinine, Ser: 1.08 mg/dL — ABNORMAL HIGH (ref 0.44–1.00)
Creatinine, Ser: 1.48 mg/dL — ABNORMAL HIGH (ref 0.44–1.00)
GFR, Estimated: 53 mL/min — ABNORMAL LOW (ref >60)
GFR, Estimated: 36 mL/min — ABNORMAL LOW (ref >60)
Glucose, Bld: 85 mg/dL (ref 70–99)
Glucose, Bld: 122 mg/dL — ABNORMAL HIGH (ref 70–99)
Potassium: 3.1 mmol/L — ABNORMAL LOW (ref 3.5–5.1)
Potassium: 3.9 mmol/L (ref 3.5–5.1)
Sodium: 147 mmol/L — ABNORMAL HIGH (ref 135–145)
Sodium: 138 mmol/L (ref 135–145)

## 2024-07-11 LAB — PROCALCITONIN: Procalcitonin: 0.14 ng/mL

## 2024-07-11 LAB — CULTURE, BLOOD (ROUTINE X 2)
Culture: NO GROWTH
Special Requests: ADEQUATE

## 2024-07-11 LAB — SURGICAL PCR SCREEN
MRSA, PCR: NEGATIVE
Staphylococcus aureus: POSITIVE — AB

## 2024-07-11 LAB — PREPARE RBC (CROSSMATCH)

## 2024-07-11 LAB — MAGNESIUM: Magnesium: 1.2 mg/dL — ABNORMAL LOW (ref 1.7–2.4)

## 2024-07-11 MED ORDER — SODIUM CHLORIDE 0.9% IV SOLUTION: Freq: Once | INTRAVENOUS | Status: AC

## 2024-07-11 MED ORDER — MAGNESIUM SULFATE 4 GM/100ML IV SOLN
4.0000 g | Freq: Once | INTRAVENOUS | Status: AC
  Administered 2024-07-11: 4 g via INTRAVENOUS
  Filled 2024-07-11: qty 100

## 2024-07-11 MED ORDER — CHLORHEXIDINE GLUCONATE 4 % EX SOLN
60.0000 mL | Freq: Once | CUTANEOUS | Status: AC
  Administered 2024-07-11: 4 via TOPICAL
  Filled 2024-07-11: qty 60

## 2024-07-11 MED ORDER — POVIDONE-IODINE 10 % EX SWAB: 2.0000 | Freq: Once | CUTANEOUS | Status: DC

## 2024-07-11 MED ORDER — CALCIUM GLUCONATE-NACL 1-0.675 GM/50ML-% IV SOLN
1.0000 g | Freq: Once | INTRAVENOUS | Status: AC
  Administered 2024-07-11: 1000 mg via INTRAVENOUS
  Filled 2024-07-11: qty 50

## 2024-07-11 MED ORDER — POTASSIUM CHLORIDE CRYS ER 20 MEQ PO TBCR
40.0000 meq | EXTENDED_RELEASE_TABLET | Freq: Once | ORAL | Status: AC
  Administered 2024-07-11: 40 meq via ORAL
  Filled 2024-07-11: qty 2

## 2024-07-11 MED ORDER — CEFAZOLIN SODIUM-DEXTROSE 2-4 GM/100ML-% IV SOLN: 2.0000 g | INTRAVENOUS | Status: DC

## 2024-07-11 NOTE — TOC Initial Note (Signed)
 Transition of Care [facility]) - Initial/Assessment Note    Patient Details  Name: Taylor Bush MRN: [id number] Date of Birth: 1946/07/18  Transition of Care [facility]) CM/SW Contact:    Sudie Erminio Deems, RN Phone Number: 07/11/2024, 1:57 PM  Clinical Narrative: Patient transferred from [address] [place]. Patient presented for non healing left leg wound-post angiography. PTA patient was from home with son and he drives patient to all appointments. Patient has DME rolling walker in the home. No home needs identified at this time. Case Manager will continue to follow for additional needs.       Expected Discharge Plan: Home/Self Care Barriers to Discharge: No Barriers Identified   Patient Goals and CMS Choice Patient states their goals for this hospitalization and ongoing recovery are:: Plan to transition home once stable.   Choice offered to / list presented to : NA      Expected Discharge Plan and Services In-house Referral: NA Discharge Planning Services: CM Consult Post Acute Care Choice: NA Living arrangements for the past 2 months: Single Family Home                   DME Agency: NA       HH Arranged: NA          Prior Living Arrangements/Services Living arrangements for the past 2 months: Single Family Home Lives with:: Adult Children Patient language and need for interpreter reviewed:: Yes Do you feel safe going back to the place where you live?: Yes      Need for Family Participation in Patient Care: No (Comment) Care giver support system in place?: No (comment) Current home services: DME (has DME rolling walker) Criminal Activity/Legal Involvement Pertinent to Current Situation/Hospitalization: No - Comment as needed  Activities of Daily Living   ADL Screening (condition at time of admission) Independently performs ADLs?: Yes (appropriate for developmental age) Is the patient deaf or have difficulty hearing?: No Does the patient have difficulty seeing, even when  wearing glasses/contacts?: No Does the patient have difficulty concentrating, remembering, or making decisions?: No  Permission Sought/Granted Permission sought to share information with : Family Supports, Case Manager                Emotional Assessment Appearance:: Appears stated age Attitude/Demeanor/Rapport: Engaged Affect (typically observed): Appropriate Orientation: : Oriented to Self, Oriented to Place, Oriented to  Time, Oriented to Situation Alcohol  / Substance Use: Not Applicable Psych Involvement: No (comment)  Admission diagnosis:  Dog bite of ankle, sequela [S91.059S, W54.0XXS] Patient Active Problem List   Diagnosis Date Noted   Dog bite of ankle, sequela 07/06/2024   Gangrene of left foot (HCC) 07/06/2024   Tongue cancer (HCC) 06/17/2024   B12 deficiency 09/13/2021   Allergic rhinitis    CKD (chronic kidney disease) stage 3, GFR 30-59 ml/min (HCC) 11/29/2013   Anemia of chronic disease 12/16/2011   Hx of total hip arthroplasty 12/15/2011   Atherosclerosis of native artery of extremity with intermittent claudication (HCC) 06/17/2009   Lumbar back pain with radiculopathy affecting left lower extremity 06/03/2009   HYPERLIPIDEMIA 12/15/2008   Essential hypertension 12/15/2008   Open-angle glaucoma 10/27/2008   PCP:  Watt Mirza, MD Pharmacy:   [facility] [phone] - [address], [place] - [address] [place] [address] Phone: [phone] Fax: [phone]  [facility]) - [place], [place] - [address] [facility] [address] [place] [address] [place] [address] Phone: [phone] Fax: [phone]  [facility] - [place], [place] - [address] [place]  [address] Phone: [phone] Fax: [phone]     Social Drivers of Health (SDOH) Social History: SDOH Screenings   Food Insecurity: No Food Insecurity (07/06/2024)  Housing: Unknown  (07/06/2024)  Transportation Needs: No Transportation Needs (07/06/2024)  Utilities: Not At Risk (07/06/2024)  Alcohol  Screen: Low Risk  (04/29/2024)  Depression (PHQ2-9): Low Risk  (04/29/2024)  Financial Resource Strain: Low Risk  (04/29/2024)  Physical Activity: Insufficiently Active (04/29/2024)  Social Connections: Moderately Isolated (07/06/2024)  Stress: No Stress Concern Present (04/29/2024)  Tobacco Use: Medium Risk (07/09/2024)  Health Literacy: Adequate Health Literacy (04/29/2024)   SDOH Interventions:     Readmission Risk Interventions     No data to display

## 2024-07-11 NOTE — Plan of Care (Signed)
  Problem: Health Behavior/Discharge Planning: Goal: Ability to manage health-related needs will improve Outcome: Progressing   Problem: Clinical Measurements: Goal: Respiratory complications will improve Outcome: Progressing Goal: Cardiovascular complication will be avoided Outcome: Progressing   Problem: Activity: Goal: Risk for activity intolerance will decrease Outcome: Progressing   Problem: Nutrition: Goal: Adequate nutrition will be maintained Outcome: Progressing   Problem: Elimination: Goal: Will not experience complications related to urinary retention Outcome: Progressing   Problem: Pain Managment: Goal: General experience of comfort will improve and/or be controlled Outcome: Progressing   Problem: Safety: Goal: Ability to remain free from injury will improve Outcome: Progressing   Problem: Cardiovascular: Goal: Ability to achieve and maintain adequate cardiovascular perfusion will improve Outcome: Progressing

## 2024-07-11 NOTE — Progress Notes (Signed)
 Patient ID: [name], female   DOB: June 24, 1946, 78 y.o.   MRN: [id number] Patient is status post endovascular reconstruction left lower extremity.  Will plan for surgical debridement of the necrotic ulcer dorsum of the left foot with application of tissue graft and wound VAC therapy.  Plan for surgery tomorrow Friday.

## 2024-07-11 NOTE — Progress Notes (Signed)
   07/11/24 9376  Provider Notification  Provider Name/Title Dr Alfornia  Date Provider Notified 07/11/24  Time Provider Notified [phone]  Method of Notification Page  Notification Reason Critical Result  Test performed and critical result Hemoglobin 6.4  Date Critical Result Received 07/11/24  Time Critical Result Received 0620  Provider response See new orders (Give blood transfusion)  Date of Provider Response 07/11/24  Time of Provider Response [phone]

## 2024-07-11 NOTE — H&P (View-Only) (Signed)
 Patient ID: [name], female   DOB: June 24, 1946, 78 y.o.   MRN: [id number] Patient is status post endovascular reconstruction left lower extremity.  Will plan for surgical debridement of the necrotic ulcer dorsum of the left foot with application of tissue graft and wound VAC therapy.  Plan for surgery tomorrow Friday.

## 2024-07-11 NOTE — Progress Notes (Signed)
 [facility]                                                                               Taylor Bush, is a 78 y.o. female, DOB - 1945-12-05, FMW:[id number] Admit date - 07/06/2024    Outpatient Primary MD for the patient is Copland, Jacques, MD  LOS - 5  days    Brief summary  78 year old with history of HTN, HLD, PAD, CKD 3B, B12 deficiency comes to the hospital after suffering from left foot dog bite. She had came to the ER, wound was sutured, received tetanus shot and was discharged on Augmentin . Returns back today due to worsening of infection. Due to concerns of gangrenous ulcer on the dorsum side, Dr. Harden was consulted. Abnormal ABIs therefore vascular consulted. Aortoiliac duplex suggested severe disease underwent  left lower extremity angiogram, s/p SFA pop revascularization.    Assessment & Plan    Assessment and Plan:   Non healing left foot wound  S/p Left SFA and popliteal artery laser atherectomy , shockwave lithotripsy and stent placement.  Orthopedics on board and and plan for surgical debridement of the necrotic ulcer on the dorsum of the left foot tomorrow by Dr Harden.  Pain control.  Bowel regimen in place.     H/o PAD Abnormal ABI'S LDL is 120.  Continue with aspirin  and statin.    Hypertension Well controlled.    Stage 3b CKD Creatinine at baseline    Hypomagnesemia Replaced. Repeat in am.    Acute anemia of blood loss from the hematoma/ superimposed on anemia of chronic disease Hemoglobin dropped to 6.9 this morning, s/p 1 unit of prbc transfusion.  Repeat hemoglobin around 8.2 to 7.9.  The earlier level of 6.4 probably contaminated sample.      Estimated body mass index is 28.89 kg/m as calculated from the following:   Height as of this encounter: 5' (1.524 m).   Weight as of this encounter: 67.1 kg.  Code Status:  full code.  DVT Prophylaxis:  heparin  injection 5,000 Units Start: 07/06/24 1400   Level of Care: Level  of care: Progressive Cardiac Family Communication: family at bedside.   Disposition Plan:     Remains inpatient appropriate:  Pending   Procedures:  S/p SFA/POP revascularization.   Consultants:   Vascular surgery Orthopedics Antimicrobials:   Anti-infectives (From admission, onward)    Start     Dose/Rate Route Frequency Ordered Stop   07/07/24 1600  meropenem  (MERREM ) 1 g in sodium chloride  0.9 % 100 mL IVPB        1 g 200 mL/hr over 30 Minutes Intravenous Every 12 hours 07/07/24 1435     07/06/24 1200  meropenem  (MERREM ) 500 mg in sodium chloride  0.9 % 100 mL IVPB  Status:  Discontinued        500 mg 200 mL/hr over 30 Minutes Intravenous Every 12 hours 07/06/24 1157 07/07/24 1435   07/06/24 1130  vancomycin  (VANCOREADY) IVPB 1250 mg/250 mL        1,250 mg 166.7 mL/hr over 90 Minutes Intravenous  Once 07/06/24 1120 07/06/24 1700   07/06/24 1045  cefTRIAXone (ROCEPHIN) 1 g in sodium chloride  0.9 %  100 mL IVPB  Status:  Discontinued        1 g 200 mL/hr over 30 Minutes Intravenous  Once 07/06/24 1031 07/06/24 1157        Medications  Scheduled Meds:  amLODipine   10 mg Oral Daily   aspirin  EC  81 mg Oral Daily   atorvastatin   80 mg Oral Daily   clopidogrel   75 mg Oral Q breakfast   heparin   5,000 Units Subcutaneous Q8H   hydrALAZINE   25 mg Oral Q8H   metoprolol   200 mg Oral Daily   sodium chloride  flush  3 mL Intravenous Q12H   Continuous Infusions:  sodium chloride  100 mL/hr at 07/11/24 9164   meropenem  (MERREM ) IV 1 g (07/11/24 0454)   PRN Meds:.acetaminophen  **OR** acetaminophen , acetaminophen , artificial tears, glucagon  (human recombinant), guaiFENesin , hydrALAZINE , hydrALAZINE , ipratropium-albuterol , melatonin, metoprolol  tartrate, ondansetron  **OR** ondansetron  (ZOFRAN ) IV, senna-docusate, sodium chloride  flush, traMADol     Subjective:   Jermya Dowding was seen and examined today.  No new complaints.   Objective:   Vitals:   07/11/24 0940 07/11/24 0941  07/11/24 0942 07/11/24 1139  BP: (!) 144/56   (!) 160/55  Pulse: 63 61 62 63  Resp:    18  Temp:    98.2 F (36.8 C)  TempSrc:    Oral  SpO2: 96% 96% 97% 99%  Weight:      Height:        Intake/Output Summary (Last 24 hours) at 07/11/2024 1431 Last data filed at 07/11/2024 0645 Gross per 24 hour  Intake 2520.12 ml  Output --  Net 2520.12 ml   Filed Weights   07/06/24 0813  Weight: 67.1 kg     Exam General exam: Appears calm and comfortable  Respiratory system: Clear to auscultation. Respiratory effort normal. Cardiovascular system: S1 & S2 heard, RRR. No JVD,  Gastrointestinal system: Abdomen is nondistended, soft and nontender.  Central nervous system: Alert and oriented. No focal neurological deficits. Extremities: left foot bandaged.  Skin: No rashes,  Psychiatry: Mood & affect appropriate.      Data Reviewed:  I have personally reviewed following labs and imaging studies   CBC Lab Results  Component Value Date   WBC 13.6 (H) 07/11/2024   RBC 2.53 (L) 07/11/2024   HGB 7.9 (L) 07/11/2024   HCT 24.4 (L) 07/11/2024   MCV 96.4 07/11/2024   MCH 31.2 07/11/2024   PLT 220 07/11/2024   MCHC 32.4 07/11/2024   RDW 15.6 (H) 07/11/2024   LYMPHSABS 1.4 07/11/2024   MONOABS 1.5 (H) 07/11/2024   EOSABS 0.5 07/11/2024   BASOSABS 0.1 07/11/2024     Last metabolic panel Lab Results  Component Value Date   NA 138 07/11/2024   K 3.9 07/11/2024   CL 107 07/11/2024   CO2 23 07/11/2024   BUN 19 07/11/2024   CREATININE 1.48 (H) 07/11/2024   GLUCOSE 122 (H) 07/11/2024   GFRNONAA 36 (L) 07/11/2024   GFRAA 38 (L) 06/25/2020   CALCIUM  8.5 (L) 07/11/2024   PHOS 3.2 07/08/2024   PROT 6.1 (L) 07/06/2024   ALBUMIN 3.1 (L) 07/06/2024   BILITOT 0.5 07/06/2024   ALKPHOS 53 07/06/2024   AST 17 07/06/2024   ALT 13 07/06/2024   ANIONGAP 8 07/11/2024    CBG (last 3)  Recent Labs    07/10/24 0027  GLUCAP 133*      Coagulation Profile: Recent Labs  Lab  07/07/24 0218  INR 0.9     Radiology Studies: No results found.  Elgie Butter M.D. [facility] 07/11/2024, 2:31 PM  Available via Epic secure chat 7am-7pm After 7 pm, please refer to night coverage provider listed on amion.

## 2024-07-11 NOTE — Progress Notes (Signed)
   07/11/24 0640  Provider Notification  Provider Name/Title Dr Alfornia  Date Provider Notified 07/11/24  Time Provider Notified [phone]  Method of Notification Page [facility] chat)  Notification Reason Critical Result  Test performed and critical result Calcium  6.6  Date Critical Result Received 07/11/24  Time Critical Result Received 0640  Provider response See new orders  Date of Provider Response 07/11/24  Time of Provider Response [phone]

## 2024-07-12 ENCOUNTER — Encounter (HOSPITAL_COMMUNITY): Payer: Self-pay | Admitting: Internal Medicine

## 2024-07-12 ENCOUNTER — Inpatient Hospital Stay (HOSPITAL_COMMUNITY): Admitting: Certified Registered Nurse Anesthetist

## 2024-07-12 ENCOUNTER — Other Ambulatory Visit: Payer: Self-pay

## 2024-07-12 ENCOUNTER — Encounter (HOSPITAL_COMMUNITY)
Admission: EM | Disposition: A | Discharge status: Home / Self Care | Payer: Self-pay | Source: Home / Self Care | Attending: Internal Medicine

## 2024-07-12 DIAGNOSIS — I96 Gangrene, not elsewhere classified: Secondary | ICD-10-CM

## 2024-07-12 DIAGNOSIS — N183 Chronic kidney disease, stage 3 unspecified: Secondary | ICD-10-CM

## 2024-07-12 DIAGNOSIS — I739 Peripheral vascular disease, unspecified: Secondary | ICD-10-CM

## 2024-07-12 DIAGNOSIS — Z87891 Personal history of nicotine dependence: Secondary | ICD-10-CM

## 2024-07-12 DIAGNOSIS — I129 Hypertensive chronic kidney disease with stage 1 through stage 4 chronic kidney disease, or unspecified chronic kidney disease: Secondary | ICD-10-CM

## 2024-07-12 HISTORY — PX: APPLICATION OF WOUND VAC: SHX5189

## 2024-07-12 HISTORY — PX: INCISION AND DRAINAGE OF WOUND: SHX1803

## 2024-07-12 LAB — PREPARE RBC (CROSSMATCH)

## 2024-07-12 LAB — CBC
HCT: 21.5 % — ABNORMAL LOW (ref 36.0–46.0)
Hemoglobin: 7.1 g/dL — ABNORMAL LOW (ref 12.0–15.0)
MCH: 32 pg (ref 26.0–34.0)
MCHC: 33 g/dL (ref 30.0–36.0)
MCV: 96.8 fL (ref 80.0–100.0)
Platelets: 234 K/uL (ref 150–400)
RBC: 2.22 MIL/uL — ABNORMAL LOW (ref 3.87–5.11)
RDW: 15.1 % (ref 11.5–15.5)
WBC: 13.4 K/uL — ABNORMAL HIGH (ref 4.0–10.5)
nRBC: 0 % (ref 0.0–0.2)

## 2024-07-12 LAB — BASIC METABOLIC PANEL WITH GFR
Anion gap: 13 (ref 5–15)
BUN: 19 mg/dL (ref 8–23)
CO2: 17 mmol/L — ABNORMAL LOW (ref 22–32)
Calcium: 8.2 mg/dL — ABNORMAL LOW (ref 8.9–10.3)
Chloride: 111 mmol/L (ref 98–111)
Creatinine, Ser: 1.38 mg/dL — ABNORMAL HIGH (ref 0.44–1.00)
GFR, Estimated: 39 mL/min — ABNORMAL LOW (ref >60)
Glucose, Bld: 110 mg/dL — ABNORMAL HIGH (ref 70–99)
Potassium: 4.8 mmol/L (ref 3.5–5.1)
Sodium: 141 mmol/L (ref 135–145)

## 2024-07-12 LAB — MAGNESIUM: Magnesium: 2.3 mg/dL (ref 1.7–2.4)

## 2024-07-12 LAB — HEMOGLOBIN AND HEMATOCRIT, BLOOD
HCT: 27.5 % — ABNORMAL LOW (ref 36.0–46.0)
Hemoglobin: 9.1 g/dL — ABNORMAL LOW (ref 12.0–15.0)

## 2024-07-12 SURGERY — IRRIGATION AND DEBRIDEMENT WOUND
Anesthesia: General | Laterality: Left

## 2024-07-12 MED ORDER — FENTANYL CITRATE (PF) 100 MCG/2ML IJ SOLN: 25.0000 ug | INTRAMUSCULAR | Status: DC | PRN

## 2024-07-12 MED ORDER — HEPARIN SODIUM (PORCINE) 5000 UNIT/ML IJ SOLN
5000.0000 [IU] | Freq: Three times a day (TID) | INTRAMUSCULAR | Status: DC
  Administered 2024-07-13 (×2): 5000 [IU] via SUBCUTANEOUS
  Filled 2024-07-12 (×2): qty 1

## 2024-07-12 MED ORDER — SODIUM CHLORIDE 0.9% IV SOLUTION: Freq: Once | INTRAVENOUS | Status: DC

## 2024-07-12 MED ORDER — OXYCODONE HCL 5 MG PO TABS
5.0000 mg | ORAL_TABLET | Freq: Four times a day (QID) | ORAL | Status: DC | PRN
  Administered 2024-07-12 – 2024-07-13 (×4): 5 mg via ORAL
  Filled 2024-07-12 (×4): qty 1

## 2024-07-12 MED ORDER — LACTATED RINGERS IV SOLN: INTRAVENOUS | Status: DC

## 2024-07-12 MED ORDER — OXYCODONE HCL 5 MG PO TABS: 5.0000 mg | ORAL_TABLET | Freq: Once | ORAL | Status: DC | PRN

## 2024-07-12 MED ORDER — FENTANYL CITRATE (PF) 100 MCG/2ML IJ SOLN
25.0000 ug | INTRAMUSCULAR | Status: DC | PRN
  Administered 2024-07-12 (×2): 25 ug via INTRAVENOUS

## 2024-07-12 MED ORDER — ACETAMINOPHEN 10 MG/ML IV SOLN
1000.0000 mg | Freq: Once | INTRAVENOUS | Status: DC | PRN
Start: 2024-07-12 — End: 2024-07-12

## 2024-07-12 MED ORDER — DEXAMETHASONE SODIUM PHOSPHATE 10 MG/ML IJ SOLN
INTRAMUSCULAR | Status: DC | PRN
  Administered 2024-07-12: 4 mg via INTRAVENOUS

## 2024-07-12 MED ORDER — MORPHINE SULFATE (PF) 2 MG/ML IV SOLN
1.0000 mg | INTRAVENOUS | Status: DC | PRN
  Administered 2024-07-12: 2 mg via INTRAVENOUS
  Filled 2024-07-12: qty 1

## 2024-07-12 MED ORDER — CHLORHEXIDINE GLUCONATE CLOTH 2 % EX PADS
6.0000 | MEDICATED_PAD | Freq: Every day | CUTANEOUS | Status: DC
  Administered 2024-07-13: 6 via TOPICAL

## 2024-07-12 MED ORDER — FENTANYL CITRATE (PF) 250 MCG/5ML IJ SOLN
INTRAMUSCULAR | Status: DC | PRN
  Administered 2024-07-12 (×2): 25 ug via INTRAVENOUS

## 2024-07-12 MED ORDER — VASHE WOUND IRRIGATION OPTIME
TOPICAL | Status: DC | PRN
  Administered 2024-07-12: 34 [oz_av]

## 2024-07-12 MED ORDER — MUPIROCIN 2 % EX OINT
1.0000 | TOPICAL_OINTMENT | Freq: Two times a day (BID) | CUTANEOUS | Status: DC
  Administered 2024-07-12 – 2024-07-13 (×3): 1 via NASAL
  Filled 2024-07-12 (×2): qty 22

## 2024-07-12 MED ORDER — FENTANYL CITRATE (PF) 250 MCG/5ML IJ SOLN
INTRAMUSCULAR | Status: AC
  Filled 2024-07-12: qty 5

## 2024-07-12 MED ORDER — OXYCODONE HCL 5 MG/5ML PO SOLN: 5.0000 mg | Freq: Once | ORAL | Status: DC | PRN

## 2024-07-12 MED ORDER — PROPOFOL 10 MG/ML IV BOLUS
INTRAVENOUS | Status: DC | PRN
  Administered 2024-07-12: 80 mg via INTRAVENOUS

## 2024-07-12 MED ORDER — PHENYLEPHRINE 80 MCG/ML (10ML) SYRINGE FOR IV PUSH (FOR BLOOD PRESSURE SUPPORT)
PREFILLED_SYRINGE | INTRAVENOUS | Status: DC | PRN
  Administered 2024-07-12 (×2): 80 ug via INTRAVENOUS

## 2024-07-12 MED ORDER — ONDANSETRON HCL 4 MG/2ML IJ SOLN
INTRAMUSCULAR | Status: DC | PRN
  Administered 2024-07-12: 4 mg via INTRAVENOUS

## 2024-07-12 MED ORDER — CHLORHEXIDINE GLUCONATE 0.12 % MT SOLN
OROMUCOSAL | Status: AC
Start: 2024-07-12 — End: 2024-07-12
  Administered 2024-07-12: 15 mL via OROMUCOSAL
  Filled 2024-07-12: qty 15

## 2024-07-12 MED ORDER — MORPHINE SULFATE (PF) 2 MG/ML IV SOLN
1.0000 mg | INTRAVENOUS | Status: DC | PRN
  Administered 2024-07-12 – 2024-07-13 (×4): 2 mg via INTRAVENOUS
  Filled 2024-07-12 (×5): qty 1

## 2024-07-12 MED ORDER — DROPERIDOL 2.5 MG/ML IJ SOLN: 0.6250 mg | Freq: Once | INTRAMUSCULAR | Status: DC | PRN

## 2024-07-12 MED ORDER — FENTANYL CITRATE (PF) 100 MCG/2ML IJ SOLN
INTRAMUSCULAR | Status: AC
  Filled 2024-07-12: qty 2

## 2024-07-12 MED ORDER — 0.9 % SODIUM CHLORIDE (POUR BTL) OPTIME
TOPICAL | Status: DC | PRN
  Administered 2024-07-12: 1000 mL

## 2024-07-12 MED ORDER — LIDOCAINE 2% (20 MG/ML) 5 ML SYRINGE
INTRAMUSCULAR | Status: DC | PRN
  Administered 2024-07-12: 60 mg via INTRAVENOUS

## 2024-07-12 MED ORDER — CHLORHEXIDINE GLUCONATE 0.12 % MT SOLN: 15.0000 mL | Freq: Once | OROMUCOSAL | Status: AC

## 2024-07-12 MED ORDER — ORAL CARE MOUTH RINSE: 15.0000 mL | Freq: Once | OROMUCOSAL | Status: AC

## 2024-07-12 SURGICAL SUPPLY — 51 items
BAG COUNTER SPONGE SURGICOUNT (BAG) ×1 IMPLANT
BLADE SURG 21 STRL SS (BLADE) ×1 IMPLANT
BNDG COHESIVE 4X5 TAN STRL LF (GAUZE/BANDAGES/DRESSINGS) IMPLANT
BNDG COHESIVE 6X5 TAN NS LF (GAUZE/BANDAGES/DRESSINGS) IMPLANT
BNDG COHESIVE 6X5 TAN ST LF (GAUZE/BANDAGES/DRESSINGS) IMPLANT
BNDG ELASTIC 4X5.8 VLCR STR LF (GAUZE/BANDAGES/DRESSINGS) IMPLANT
BNDG GAUZE DERMACEA FLUFF 4 (GAUZE/BANDAGES/DRESSINGS) IMPLANT
CANISTER SUCTION 3000ML PPV (SUCTIONS) ×1 IMPLANT
CANISTER WOUND CARE 500ML ATS (WOUND CARE) ×1 IMPLANT
CANISTER WOUNDNEG PRESSURE 500 (CANNISTER) IMPLANT
CLEANSER WND VASHE 34 (WOUND CARE) IMPLANT
CLEANSER WND VASHE INSTL 34OZ (WOUND CARE) IMPLANT
COVER SURGICAL LIGHT HANDLE (MISCELLANEOUS) ×2 IMPLANT
DRAPE DERMATAC (DRAPES) IMPLANT
DRAPE HALF SHEET 40X57 (DRAPES) IMPLANT
DRAPE INCISE IOBAN 66X45 STRL (DRAPES) IMPLANT
DRAPE SURG ORHT 6 SPLT 77X108 (DRAPES) IMPLANT
DRAPE U-SHAPE 47X51 STRL (DRAPES) ×1 IMPLANT
DRESSING PREVENA PLUS CUSTOM (GAUZE/BANDAGES/DRESSINGS) IMPLANT
DRESSING VERAFLO CLEANS CC MED (GAUZE/BANDAGES/DRESSINGS) IMPLANT
DRSG ADAPTIC 3X8 NADH LF (GAUZE/BANDAGES/DRESSINGS) ×1 IMPLANT
DRSG VAC GRANUFOAM LG (GAUZE/BANDAGES/DRESSINGS) IMPLANT
DRSG VAC GRANUFOAM MED (GAUZE/BANDAGES/DRESSINGS) IMPLANT
DRSG VAC GRANUFOAM SM (GAUZE/BANDAGES/DRESSINGS) IMPLANT
DRSG VAC PEEL AND PLACE LRG (GAUZE/BANDAGES/DRESSINGS) IMPLANT
DURAPREP 26ML APPLICATOR (WOUND CARE) ×1 IMPLANT
ELECTRODE REM PT RTRN 9FT ADLT (ELECTROSURGICAL) ×1 IMPLANT
GAUZE PAD ABD 8X10 STRL (GAUZE/BANDAGES/DRESSINGS) IMPLANT
GAUZE SPONGE 4X4 12PLY STRL (GAUZE/BANDAGES/DRESSINGS) IMPLANT
GAUZE XEROFORM 5X9 LF (GAUZE/BANDAGES/DRESSINGS) ×1 IMPLANT
GLOVE BIOGEL PI IND STRL 9 (GLOVE) ×1 IMPLANT
GLOVE SURG ORTHO 9.0 STRL STRW (GLOVE) ×1 IMPLANT
GOWN STRL REUS W/ TWL LRG LVL3 (GOWN DISPOSABLE) ×1 IMPLANT
GOWN STRL REUS W/ TWL XL LVL3 (GOWN DISPOSABLE) ×2 IMPLANT
GRAFT SKIN WND MICRO 38 (Tissue) IMPLANT
KIT BASIN OR (CUSTOM PROCEDURE TRAY) ×1 IMPLANT
KIT TURNOVER KIT B (KITS) ×1 IMPLANT
MANIFOLD NEPTUNE II (INSTRUMENTS) ×1 IMPLANT
NS IRRIG 1000ML POUR BTL (IV SOLUTION) ×1 IMPLANT
PACK ORTHO EXTREMITY (CUSTOM PROCEDURE TRAY) ×1 IMPLANT
PAD ARMBOARD POSITIONER FOAM (MISCELLANEOUS) ×2 IMPLANT
PAD NEG PRESSURE SENSATRAC (MISCELLANEOUS) IMPLANT
SET HNDPC FAN SPRY TIP SCT (DISPOSABLE) IMPLANT
STOCKINETTE IMPERVIOUS 9X36 MD (GAUZE/BANDAGES/DRESSINGS) IMPLANT
SUT ETHILON 2 0 PSLX (SUTURE) ×1 IMPLANT
SWAB COLLECTION DEVICE MRSA (MISCELLANEOUS) ×1 IMPLANT
SWAB CULTURE ESWAB REG 1ML (MISCELLANEOUS) IMPLANT
TOWEL GREEN STERILE (TOWEL DISPOSABLE) ×1 IMPLANT
TOWEL GREEN STERILE FF (TOWEL DISPOSABLE) ×1 IMPLANT
TUBE CONNECTING 12X1/4 (SUCTIONS) ×1 IMPLANT
YANKAUER SUCT BULB TIP NO VENT (SUCTIONS) ×1 IMPLANT

## 2024-07-12 NOTE — Transfer of Care (Signed)
 Immediate Anesthesia Transfer of Care Note  Patient: Taylor Bush  Procedure(s) Performed: IRRIGATION AND DEBRIDEMENT WOUND (Left) APPLICATION, WOUND VAC (Left)  Patient Location: PACU  Anesthesia Type:General  Level of Consciousness: awake, alert , and oriented  Airway & Oxygen Therapy: Patient Spontanous Breathing  Post-op Assessment: Report given to RN and Post -op Vital signs reviewed and stable  Post vital signs: Reviewed and stable  Last Vitals:  Vitals Value Taken Time  BP 132/77 07/12/24 11:11  Temp    Pulse 63 07/12/24 11:13  Resp 19 07/12/24 11:13  SpO2 88 % 07/12/24 11:13  Vitals shown include unfiled device data.  Last Pain:  Vitals:   07/12/24 0952  TempSrc: Oral  PainSc:       Patients Stated Pain Goal: 0 (07/11/24 2010)  Complications: No notable events documented.

## 2024-07-12 NOTE — Anesthesia Procedure Notes (Signed)
 Procedure Name: LMA Insertion Date/Time: 07/12/2024 10:47 AM  Performed by: Claudene Florina Boga, CRNAPre-anesthesia Checklist: Patient identified, Emergency Drugs available, Suction available and Patient being monitored Patient Re-evaluated:Patient Re-evaluated prior to induction Oxygen Delivery Method: Circle System Utilized Preoxygenation: Pre-oxygenation with 100% oxygen Induction Type: IV induction LMA: LMA inserted LMA Size: 4.0 Number of attempts: 1 Placement Confirmation: positive ETCO2 Tube secured with: Tape Dental Injury: Teeth and Oropharynx as per pre-operative assessment

## 2024-07-12 NOTE — Plan of Care (Signed)

## 2024-07-12 NOTE — Plan of Care (Signed)
  Problem: Clinical Measurements: Goal: Respiratory complications will improve Outcome: Progressing Goal: Cardiovascular complication will be avoided Outcome: Progressing   Problem: Activity: Goal: Risk for activity intolerance will decrease Outcome: Progressing   Problem: Nutrition: Goal: Adequate nutrition will be maintained Outcome: Progressing   Problem: Pain Managment: Goal: General experience of comfort will improve and/or be controlled Outcome: Progressing   Problem: Safety: Goal: Ability to remain free from injury will improve Outcome: Progressing   Problem: Activity: Goal: Ability to return to baseline activity level will improve Outcome: Progressing   Problem: Cardiovascular: Goal: Ability to achieve and maintain adequate cardiovascular perfusion will improve Outcome: Progressing

## 2024-07-12 NOTE — Op Note (Signed)
 07/12/2024  11:11 AM  PATIENT:  Taylor Bush    PRE-OPERATIVE DIAGNOSIS:  Gangrene Left Foot  POST-OPERATIVE DIAGNOSIS:  Same  PROCEDURE: Excisional debridement left foot including multiple webspaces. Application Kerecis micro graft 38 cm to cover wound surface area 30 cm. Application of cleanse choice wound VAC sponge in the peel and place wound VAC sponge.  SURGEON:  Jerona LULLA Sage, MD  PHYSICIAN ASSISTANT:None ANESTHESIA:   General  PREOPERATIVE INDICATIONS:  [name] is a  78 y.o. female with a diagnosis of Gangrene Left Foot who failed conservative measures and elected for surgical management.    The risks benefits and alternatives were discussed with the patient preoperatively including but not limited to the risks of infection, bleeding, nerve injury, cardiopulmonary complications, the need for revision surgery, among others, and the patient was willing to proceed.  OPERATIVE IMPLANTS:   * No implants in log *  @ENCIMAGES @  OPERATIVE FINDINGS: Exposed extensor tendons no petechial bleeding.  No abscess.  OPERATIVE PROCEDURE: Patient was brought the operating room and underwent general anesthetic.  After adequate levels anesthesia obtained patient's left lower extremity was prepped using DuraPrep draped in a sterile field a timeout was called.  A 21 blade knife was used to excise the black eschar on the dorsum of the left foot.  The nonviable ischemic tissue was debrided down to the extensor tendons.  The extensor tendons were viable and intact.  Further debridement was performed with a rondure.  This left a wound that was 5 x 5 cm.  The wound was irrigated with Vashe.  Wound bed was filled with 38 cm of Kerecis micro graft.  This was then covered with the blue cleanse choice sponge followed by the peel in place sponge.  This had a good suction fit patient was extubated taken the PACU in stable condition.   DISCHARGE PLANNING:  Antibiotic duration: Continue antibiotics  for 24 hours  Weightbearing: Touchdown weightbearing on the left  Pain medication: Opioid pathway  Dressing care/ Wound VAC: Wound VAC for 1 week  Ambulatory devices: Walker  Discharge to: Discharge planning based on therapy recommendations.  Follow-up: In the office 1 week post operative.

## 2024-07-12 NOTE — Interval H&P Note (Signed)
 History and Physical Interval Note:  07/12/2024 6:35 AM  Taylor Bush  has presented today for surgery, with the diagnosis of Gangrene Left Foot.  The various methods of treatment have been discussed with the patient and family. After consideration of risks, benefits and other options for treatment, the patient has consented to  Procedure(s) with comments: IRRIGATION AND DEBRIDEMENT WOUND (Left) - LEFT FOOT DEBRIDEMENT APPLICATION, WOUND VAC (Left) as a surgical intervention.  The patient's history has been reviewed, patient examined, no change in status, stable for surgery.  I have reviewed the patient's chart and labs.  Questions were answered to the patient's satisfaction.     Launi Asencio V Paloma [place]

## 2024-07-12 NOTE — Progress Notes (Signed)
 [facility]                                                                               Taylor Bush, is a 78 y.o. female, DOB - 01/30/1946, FMW:[id number] Admit date - 07/06/2024    Outpatient Primary MD for the patient is Copland, Jacques, MD  LOS - 6  days    Brief summary  78 year old with history of HTN, HLD, PAD, CKD 3B, B12 deficiency comes to the hospital after suffering from left foot dog bite. She had came to the ER, wound was sutured, received tetanus shot and was discharged on Augmentin . Returns back today due to worsening of infection. Due to concerns of gangrenous ulcer on the dorsum side, Dr. Harden was consulted. Abnormal ABIs therefore vascular consulted. Aortoiliac duplex suggested severe disease underwent  left lower extremity angiogram, s/p SFA pop revascularization.    Assessment & Plan    Assessment and Plan:   Non healing left foot wound  S/p Left SFA and popliteal artery laser atherectomy , shockwave lithotripsy and stent placement.  Orthopedics on board and and she underwent Excisional debridement left foot including multiple webspaces , with graft placement and wound vac placement by Dr Harden on 9/5.  Pain control.  Bowel regimen in place.     H/o PAD Abnormal ABI'S LDL is 120.  Continue with aspirin  and statin.    Hypertension Optimal.    Stage 3b CKD Creatinine at baseline    Hypomagnesemia Replaced. Repeat in am.    Acute anemia of blood loss from the hematoma/ superimposed on anemia of chronic disease Hemoglobin dropped to 6.9 this morning, s/p 1 unit of prbc transfusion.  Repeat hemoglobin around 8.2 to 7.9. to 7.1 this morning. 1 unit of prbc transfusion ordered. Recheck H&H tonight and in the morning.       Estimated body mass index is 28.89 kg/m as calculated from the following:   Height as of this encounter: 5' (1.524 m).   Weight as of this encounter: 67.1 kg.  Code Status:  full code.  DVT Prophylaxis:   heparin  injection 5,000 Units Start: 07/13/24 0600   Level of Care: Level of care: Progressive Cardiac Family Communication: family at bedside.   Disposition Plan:     Remains inpatient appropriate:  Pending   Procedures:  S/p SFA/POP revascularization.   Consultants:   Vascular surgery Orthopedics Antimicrobials:   Anti-infectives (From admission, onward)    Start     Dose/Rate Route Frequency Ordered Stop   07/12/24 0600  ceFAZolin  (ANCEF ) IVPB 2g/100 mL premix  Status:  Discontinued        2 g 200 mL/hr over 30 Minutes Intravenous On call to O.R. 07/11/24 1809 07/11/24 2046   07/07/24 1600  meropenem  (MERREM ) 1 g in sodium chloride  0.9 % 100 mL IVPB        1 g 200 mL/hr over 30 Minutes Intravenous Every 12 hours 07/07/24 1435     07/06/24 1200  meropenem  (MERREM ) 500 mg in sodium chloride  0.9 % 100 mL IVPB  Status:  Discontinued        500 mg 200 mL/hr over 30 Minutes Intravenous Every 12 hours  07/06/24 1157 07/07/24 1435   07/06/24 1130  vancomycin  (VANCOREADY) IVPB 1250 mg/250 mL        1,250 mg 166.7 mL/hr over 90 Minutes Intravenous  Once 07/06/24 1120 07/06/24 1700   07/06/24 1045  cefTRIAXone (ROCEPHIN) 1 g in sodium chloride  0.9 % 100 mL IVPB  Status:  Discontinued        1 g 200 mL/hr over 30 Minutes Intravenous  Once 07/06/24 1031 07/06/24 1157        Medications  Scheduled Meds:  sodium chloride    Intravenous Once   amLODipine   10 mg Oral Daily   aspirin  EC  81 mg Oral Daily   atorvastatin   80 mg Oral Daily   Chlorhexidine  Gluconate Cloth  6 each Topical Daily   clopidogrel   75 mg Oral Q breakfast   [START ON 07/13/2024] heparin   5,000 Units Subcutaneous Q8H   hydrALAZINE   25 mg Oral Q8H   metoprolol   200 mg Oral Daily   mupirocin  ointment  1 Application Nasal BID   sodium chloride  flush  3 mL Intravenous Q12H   Continuous Infusions:  sodium chloride  100 mL/hr at 07/12/24 9266   meropenem  (MERREM ) IV 1 g (07/12/24 0505)   PRN Meds:.acetaminophen   **OR** acetaminophen , artificial tears, glucagon  (human recombinant), guaiFENesin , hydrALAZINE , hydrALAZINE , ipratropium-albuterol , melatonin, metoprolol  tartrate, morphine  injection, ondansetron  **OR** ondansetron  (ZOFRAN ) IV, senna-docusate, traMADol     Subjective:   Taylor Bush was seen and examined today. Comfortable, no new complaints.   Objective:   Vitals:   07/12/24 1130 07/12/24 1132 07/12/24 1145 07/12/24 1330  BP: (!) 156/58  (!) 145/66 (!) 151/67  Pulse: 62  (!) 59 64  Resp: 20  13 17   Temp:   98.1 F (36.7 C) 98.1 F (36.7 C)  TempSrc:   Oral Oral  SpO2: 94% 96% 96% 97%  Weight:      Height:        Intake/Output Summary (Last 24 hours) at 07/12/2024 1534 Last data filed at 07/12/2024 1114 Gross per 24 hour  Intake 500 ml  Output 5 ml  Net 495 ml   Filed Weights   07/06/24 0813 07/12/24 0912  Weight: 67.1 kg 67.1 kg     Exam General exam: Appears calm and comfortable  Respiratory system: Clear to auscultation. Respiratory effort normal. Cardiovascular system: S1 & S2 heard, RRR. No JVD,  Gastrointestinal system: Abdomen is nondistended, soft and nontender.  Central nervous system: Alert and oriented. No focal neurological deficits. Extremities: Symmetric 5 x 5 power. Skin: left foot bandaged.  Psychiatry: Mood & affect appropriate.       Data Reviewed:  I have personally reviewed following labs and imaging studies   CBC Lab Results  Component Value Date   WBC 13.4 (H) 07/12/2024   RBC 2.22 (L) 07/12/2024   HGB 7.1 (L) 07/12/2024   HCT 21.5 (L) 07/12/2024   MCV 96.8 07/12/2024   MCH 32.0 07/12/2024   PLT 234 07/12/2024   MCHC 33.0 07/12/2024   RDW 15.1 07/12/2024   LYMPHSABS 1.4 07/11/2024   MONOABS 1.5 (H) 07/11/2024   EOSABS 0.5 07/11/2024   BASOSABS 0.1 07/11/2024     Last metabolic panel Lab Results  Component Value Date   NA 141 07/12/2024   K 4.8 07/12/2024   CL 111 07/12/2024   CO2 17 (L) 07/12/2024   BUN 19 07/12/2024    CREATININE 1.38 (H) 07/12/2024   GLUCOSE 110 (H) 07/12/2024   GFRNONAA 39 (L) 07/12/2024   GFRAA 38 (L)  06/25/2020   CALCIUM  8.2 (L) 07/12/2024   PHOS 3.2 07/08/2024   PROT 6.1 (L) 07/06/2024   ALBUMIN 3.1 (L) 07/06/2024   BILITOT 0.5 07/06/2024   ALKPHOS 53 07/06/2024   AST 17 07/06/2024   ALT 13 07/06/2024   ANIONGAP 13 07/12/2024    CBG (last 3)  Recent Labs    07/10/24 0027  GLUCAP 133*      Coagulation Profile: Recent Labs  Lab 07/07/24 0218  INR 0.9     Radiology Studies: No results found.      Elgie Butter M.D. [facility] 07/12/2024, 3:34 PM  Available via Epic secure chat 7am-7pm After 7 pm, please refer to night coverage provider listed on amion.

## 2024-07-12 NOTE — Anesthesia Preprocedure Evaluation (Addendum)
 Anesthesia Evaluation  Patient identified by MRN, date of birth, ID band Patient awake    Reviewed: Allergy & Precautions, NPO status , Patient's Chart, lab work & pertinent test results  Airway Mallampati: III  TM Distance: >3 FB Neck ROM: Full    Dental  (+) Teeth Intact, Dental Advisory Given   Pulmonary former smoker   breath sounds clear to auscultation       Cardiovascular hypertension, Pt. on medications and Pt. on home beta blockers + Peripheral Vascular Disease   Rhythm:Regular Rate:Normal     Neuro/Psych  Neuromuscular disease  negative psych ROS   GI/Hepatic negative GI ROS, Neg liver ROS,,,  Endo/Other  negative endocrine ROS    Renal/GU CRFRenal disease     Musculoskeletal  (+) Arthritis ,    Abdominal   Peds  Hematology  (+) Blood dyscrasia, anemia   Anesthesia Other Findings   Reproductive/Obstetrics                              Anesthesia Physical Anesthesia Plan  ASA: 3  Anesthesia Plan: General   Post-op Pain Management:    Induction: Intravenous  PONV Risk Score and Plan: 4 or greater and Ondansetron , Dexamethasone  and Treatment may vary due to age or medical condition  Airway Management Planned: LMA  Additional Equipment: None  Intra-op Plan:   Post-operative Plan: Extubation in OR  Informed Consent: I have reviewed the patients History and Physical, chart, labs and discussed the procedure including the risks, benefits and alternatives for the proposed anesthesia with the patient or authorized representative who has indicated his/her understanding and acceptance.     Dental advisory given  Plan Discussed with: CRNA  Anesthesia Plan Comments: (PAT note written 06/13/2024 by Allison Zelenak, PA-C.  )         Anesthesia Quick Evaluation

## 2024-07-12 NOTE — Anesthesia Postprocedure Evaluation (Signed)
 Anesthesia Post Note  Patient: Taylor Bush  Procedure(s) Performed: IRRIGATION AND DEBRIDEMENT WOUND (Left) APPLICATION, WOUND VAC (Left)     Patient location during evaluation: PACU Anesthesia Type: General Level of consciousness: awake and alert Pain management: pain level controlled Vital Signs Assessment: post-procedure vital signs reviewed and stable Respiratory status: spontaneous breathing, nonlabored ventilation, respiratory function stable and patient connected to nasal cannula oxygen Cardiovascular status: blood pressure returned to baseline and stable Postop Assessment: no apparent nausea or vomiting Anesthetic complications: no   No notable events documented.  Last Vitals:  Vitals:   07/12/24 1145 07/12/24 1330  BP: (!) 145/66 (!) 151/67  Pulse: (!) 59 64  Resp: 13 17  Temp: 36.7 C 36.7 C  SpO2: 96% 97%    Last Pain:  Vitals:   07/12/24 1330  TempSrc: Oral  PainSc: 7                  [name]

## 2024-07-13 ENCOUNTER — Encounter (HOSPITAL_COMMUNITY): Payer: Self-pay | Admitting: *Deleted

## 2024-07-13 ENCOUNTER — Emergency Department (HOSPITAL_COMMUNITY)
Admission: EM | Admit: 2024-07-13 | Discharge: 2024-07-13 | Disposition: A | Discharge status: Home / Self Care | Attending: Emergency Medicine | Admitting: Emergency Medicine

## 2024-07-13 ENCOUNTER — Other Ambulatory Visit: Payer: Self-pay

## 2024-07-13 DIAGNOSIS — Z7982 Long term (current) use of aspirin: Secondary | ICD-10-CM | POA: Insufficient documentation

## 2024-07-13 DIAGNOSIS — Z4800 Encounter for change or removal of nonsurgical wound dressing: Secondary | ICD-10-CM | POA: Insufficient documentation

## 2024-07-13 DIAGNOSIS — W540XXD Bitten by dog, subsequent encounter: Secondary | ICD-10-CM

## 2024-07-13 DIAGNOSIS — Z5189 Encounter for other specified aftercare: Secondary | ICD-10-CM

## 2024-07-13 LAB — TYPE AND SCREEN
ABO/RH(D): O POS
Antibody Screen: NEGATIVE
Unit division: 0
Unit division: 0
Unit division: 0

## 2024-07-13 LAB — BPAM RBC
Blood Product Expiration Date: 202509192359
Blood Product Expiration Date: 202509222359
Blood Product Expiration Date: 202509252359
ISSUE DATE / TIME: 202509030848
ISSUE DATE / TIME: 202509040854
ISSUE DATE / TIME: 202509050929
Unit Type and Rh: 5100
Unit Type and Rh: 5100
Unit Type and Rh: 5100

## 2024-07-13 LAB — CBC
HCT: 26.4 % — ABNORMAL LOW (ref 36.0–46.0)
Hemoglobin: 8.8 g/dL — ABNORMAL LOW (ref 12.0–15.0)
MCH: 31.4 pg (ref 26.0–34.0)
MCHC: 33.3 g/dL (ref 30.0–36.0)
MCV: 94.3 fL (ref 80.0–100.0)
Platelets: 235 K/uL (ref 150–400)
RBC: 2.8 MIL/uL — ABNORMAL LOW (ref 3.87–5.11)
RDW: 15.9 % — ABNORMAL HIGH (ref 11.5–15.5)
WBC: 15.1 K/uL — ABNORMAL HIGH (ref 4.0–10.5)
nRBC: 0 % (ref 0.0–0.2)

## 2024-07-13 LAB — MAGNESIUM: Magnesium: 2 mg/dL (ref 1.7–2.4)

## 2024-07-13 LAB — BASIC METABOLIC PANEL WITH GFR
Anion gap: 10 (ref 5–15)
BUN: 16 mg/dL (ref 8–23)
CO2: 19 mmol/L — ABNORMAL LOW (ref 22–32)
Calcium: 8.5 mg/dL — ABNORMAL LOW (ref 8.9–10.3)
Chloride: 107 mmol/L (ref 98–111)
Creatinine, Ser: 1.25 mg/dL — ABNORMAL HIGH (ref 0.44–1.00)
GFR, Estimated: 44 mL/min — ABNORMAL LOW (ref >60)
Glucose, Bld: 122 mg/dL — ABNORMAL HIGH (ref 70–99)
Potassium: 4.5 mmol/L (ref 3.5–5.1)
Sodium: 136 mmol/L (ref 135–145)

## 2024-07-13 MED ORDER — SENNOSIDES-DOCUSATE SODIUM 8.6-50 MG PO TABS: 1.0000 | ORAL_TABLET | Freq: Every evening | ORAL | Status: DC | PRN

## 2024-07-13 MED ORDER — CLOPIDOGREL BISULFATE 75 MG PO TABS: 75.0000 mg | ORAL_TABLET | Freq: Every day | ORAL | 3 refills | Status: DC

## 2024-07-13 MED ORDER — MUPIROCIN 2 % EX OINT: 1.0000 | TOPICAL_OINTMENT | Freq: Two times a day (BID) | CUTANEOUS | 0 refills | Status: DC

## 2024-07-13 MED ORDER — ATORVASTATIN CALCIUM 80 MG PO TABS: 80.0000 mg | ORAL_TABLET | Freq: Every day | ORAL | 3 refills | Status: DC

## 2024-07-13 MED ORDER — OXYCODONE HCL 5 MG PO TABS: 5.0000 mg | ORAL_TABLET | Freq: Four times a day (QID) | ORAL | 0 refills | Status: AC | PRN

## 2024-07-13 NOTE — ED Triage Notes (Signed)
 Patient arrives from home. States she was getting in the shower and tried to wrap her foot with plastic and accidentally ripped her her wound vac off of her foot. Patient was just discharged from the hospital today. She was in room [phone].

## 2024-07-13 NOTE — Progress Notes (Signed)
 Patient ID: Taylor Bush, female   DOB: 1946-03-13, 78 y.o.   MRN: [id number] Patient is status post debridement dorsal foot wound on the left status post revascularization.  The wound VAC has 25 cc.  There is not a good suction fit.  Patient may discharge with the Prevena plus portable wound VAC pump however the dressing may need to be overwrapped with Ioban prior to discharge.

## 2024-07-13 NOTE — Progress Notes (Incomplete)
 [facility]                                                                               Taylor Bush, is a 78 y.o. female, DOB - [birth year], FMW:[id number] Admit date - 07/06/2024    Outpatient Primary MD for the patient is Copland, Jacques, MD  LOS - 7  days    Brief summary  78 year old with history of HTN, HLD, PAD, CKD 3B, B12 deficiency comes to the hospital after suffering from left foot dog bite. She had came to the ER, wound was sutured, received tetanus shot and was discharged on Augmentin . Returns back today due to worsening of infection. Due to concerns of gangrenous ulcer on the dorsum side, Dr. Harden was consulted. Abnormal ABIs therefore vascular consulted. Aortoiliac duplex suggested severe disease underwent  left lower extremity angiogram, s/p SFA pop revascularization.    Assessment & Plan    Assessment and Plan:   Non healing left foot wound  S/p Left SFA and popliteal artery laser atherectomy , shockwave lithotripsy and stent placement.  Orthopedics on board and and she underwent Excisional debridement left foot including multiple webspaces , with graft placement and wound vac placement by Dr Harden on 9/5.  Pain control.  Bowel regimen in place.     H/o PAD Abnormal ABI'S LDL is 120.  Continue with aspirin  and statin.    Hypertension Optimal.    Stage 3b CKD Creatinine at baseline    Hypomagnesemia Replaced. Repeat in am.    Acute anemia of blood loss from the hematoma/ superimposed on anemia of chronic disease Hemoglobin dropped to 6.9 this morning, s/p 1 unit of prbc transfusion.  Repeat hemoglobin around 8.2 to 7.9. to 7.1 this morning. 1 unit of prbc transfusion ordered. Recheck H&H tonight and in the morning.       Estimated body mass index is 28.89 kg/m as calculated from the following:   Height as of this encounter: 5' (1.524 m).   Weight as of this encounter: 67.1 kg.  Code Status:  full code.  DVT Prophylaxis:   heparin  injection 5,000 Units Start: 07/13/24 0600   Level of Care: Level of care: Progressive Cardiac Family Communication: family at bedside.   Disposition Plan:     Remains inpatient appropriate:  Pending   Procedures:  S/p SFA/POP revascularization.   Consultants:   Vascular surgery Orthopedics Antimicrobials:   Anti-infectives (From admission, onward)    Start     Dose/Rate Route Frequency Ordered Stop   07/12/24 0600  ceFAZolin  (ANCEF ) IVPB 2g/100 mL premix  Status:  Discontinued        2 g 200 mL/hr over 30 Minutes Intravenous On call to O.R. 07/11/24 1809 07/11/24 2046   07/07/24 1600  meropenem  (MERREM ) 1 g in sodium chloride  0.9 % 100 mL IVPB        1 g 200 mL/hr over 30 Minutes Intravenous Every 12 hours 07/07/24 1435     07/06/24 1200  meropenem  (MERREM ) 500 mg in sodium chloride  0.9 % 100 mL IVPB  Status:  Discontinued        500 mg 200 mL/hr over 30 Minutes Intravenous Every 12 hours  07/06/24 1157 07/07/24 1435   07/06/24 1130  vancomycin  (VANCOREADY) IVPB 1250 mg/250 mL        1,250 mg 166.7 mL/hr over 90 Minutes Intravenous  Once 07/06/24 1120 07/06/24 1700   07/06/24 1045  cefTRIAXone (ROCEPHIN) 1 g in sodium chloride  0.9 % 100 mL IVPB  Status:  Discontinued        1 g 200 mL/hr over 30 Minutes Intravenous  Once 07/06/24 1031 07/06/24 1157        Medications  Scheduled Meds:  sodium chloride    Intravenous Once   amLODipine   10 mg Oral Daily   aspirin  EC  81 mg Oral Daily   atorvastatin   80 mg Oral Daily   Chlorhexidine  Gluconate Cloth  6 each Topical Daily   clopidogrel   75 mg Oral Q breakfast   heparin   5,000 Units Subcutaneous Q8H   hydrALAZINE   25 mg Oral Q8H   metoprolol   200 mg Oral Daily   mupirocin  ointment  1 Application Nasal BID   sodium chloride  flush  3 mL Intravenous Q12H   Continuous Infusions:  sodium chloride  100 mL/hr at 07/13/24 0914   meropenem  (MERREM ) IV 1 g (07/13/24 0515)   PRN Meds:.acetaminophen  **OR** acetaminophen ,  artificial tears, glucagon  (human recombinant), guaiFENesin , hydrALAZINE , hydrALAZINE , ipratropium-albuterol , melatonin, metoprolol  tartrate, morphine  injection, ondansetron  **OR** ondansetron  (ZOFRAN ) IV, oxyCODONE , senna-docusate    Subjective:   Taylor Bush was seen and examined today. Comfortable, no new complaints.   Objective:   Vitals:   07/12/24 1330 07/12/24 1958 07/12/24 2356 07/13/24 0452  BP: (!) 151/67 (!) 157/72 (!) 171/66 (!) 169/70  Pulse: 64  68 65  Resp: 17  18 (!) 185  Temp: 98.1 F (36.7 C) 98.7 F (37.1 C) 98.1 F (36.7 C) 98.1 F (36.7 C)  TempSrc: Oral Oral Oral Oral  SpO2: 97%  93% 97%  Weight:      Height:        Intake/Output Summary (Last 24 hours) at 07/13/2024 1010 Last data filed at 07/12/2024 1114 Gross per 24 hour  Intake 500 ml  Output 5 ml  Net 495 ml   Filed Weights   07/06/24 0813 07/12/24 0912  Weight: 67.1 kg 67.1 kg     Exam General exam: Appears calm and comfortable  Respiratory system: Clear to auscultation. Respiratory effort normal. Cardiovascular system: S1 & S2 heard, RRR. No JVD,  Gastrointestinal system: Abdomen is nondistended, soft and nontender.  Central nervous system: Alert and oriented. No focal neurological deficits. Extremities: Symmetric 5 x 5 power. Skin: left foot bandaged.  Psychiatry: Mood & affect appropriate.       Data Reviewed:  I have personally reviewed following labs and imaging studies   CBC Lab Results  Component Value Date   WBC 15.1 (H) 07/13/2024   RBC 2.80 (L) 07/13/2024   HGB 8.8 (L) 07/13/2024   HCT 26.4 (L) 07/13/2024   MCV 94.3 07/13/2024   MCH 31.4 07/13/2024   PLT 235 07/13/2024   MCHC 33.3 07/13/2024   RDW 15.9 (H) 07/13/2024   LYMPHSABS 1.4 07/11/2024   MONOABS 1.5 (H) 07/11/2024   EOSABS 0.5 07/11/2024   BASOSABS 0.1 07/11/2024     Last metabolic panel Lab Results  Component Value Date   NA 136 07/13/2024   K 4.5 07/13/2024   CL 107 07/13/2024   CO2 19 (L)  07/13/2024   BUN 16 07/13/2024   CREATININE 1.25 (H) 07/13/2024   GLUCOSE 122 (H) 07/13/2024   GFRNONAA 44 (L) 07/13/2024  GFRAA 38 (L) 06/25/2020   CALCIUM  8.5 (L) 07/13/2024   PHOS 3.2 07/08/2024   PROT 6.1 (L) 07/06/2024   ALBUMIN 3.1 (L) 07/06/2024   BILITOT 0.5 07/06/2024   ALKPHOS 53 07/06/2024   AST 17 07/06/2024   ALT 13 07/06/2024   ANIONGAP 10 07/13/2024    CBG (last 3)  No results for input(s): GLUCAP in the last 72 hours.     Coagulation Profile: Recent Labs  Lab 07/07/24 0218  INR 0.9     Radiology Studies: No results found.      Elgie Butter M.D. [facility] 07/13/2024, 10:10 AM  Available via Epic secure chat 7am-7pm After 7 pm, please refer to night coverage provider listed on amion.

## 2024-07-13 NOTE — ED Triage Notes (Signed)
 Pt had wound vac placed to Left foot; was trying to cover the wound vac area with plastic so it wouldn't get wet and pulled the tubing from wound. Dressing appears in place. Pt brought the device with her with tubing

## 2024-07-13 NOTE — ED Provider Notes (Signed)
 [place] [facility] AT [facility] Provider Note   CSN: [id number] Arrival date & time: 07/13/24  2046     Patient presents with: Wound Check   Taylor Bush is a 78 y.o. female.   HPI 78 year old female presents with wound VAC malfunction.  She was discharged today and when she was trying to get out of the car she was trying to wrap her leg up and a bag as the ground was wet from the rain.  However as she was putting on the bag it pulled off the wound VAC tubing.  She now presents to the ED.  No other complaints, no bleeding.  Prior to Admission medications   Medication Sig Start Date End Date Taking? Authorizing Provider  amLODipine  (NORVASC ) 10 MG tablet TAKE 1 TABLET BY MOUTH DAILY 11/19/23   Copland, Jacques, MD  aspirin  81 MG tablet Take 81 mg by mouth daily.    [provider]  atorvastatin  (LIPITOR ) 80 MG tablet Take 1 tablet (80 mg total) by mouth daily. 07/14/24   Akula, Vijaya, MD  clopidogrel  (PLAVIX ) 75 MG tablet Take 1 tablet (75 mg total) by mouth daily with breakfast. 07/14/24   Cherlyn Labella, MD  Faricimab -svoa (VABYSMO  IZ) 1 Dose by Intravitreal route every 6 (six) weeks.    [provider]  JOURNAVX 50 MG TABS Take 50 mg by mouth 2 (two) times daily as needed (Pain). 06/18/24   [provider]  lisinopril  (ZESTRIL ) 20 MG tablet TAKE 1 TABLET BY MOUTH DAILY 11/19/23   Copland, Jacques, MD  metoprolol  (TOPROL -XL) 200 MG 24 hr tablet TAKE 1 TABLET BY MOUTH DAILY 11/19/23   Copland, Jacques, MD  mupirocin  ointment (BACTROBAN ) 2 % Place 1 Application into the nose 2 (two) times daily. 07/13/24   Akula, Vijaya, MD  ondansetron  (ZOFRAN -ODT) 4 MG disintegrating tablet Take 1 tablet (4 mg total) by mouth every 8 (eight) hours as needed for nausea or vomiting. 06/18/24   Jesus Oliphant, MD  oxyCODONE  (OXY IR/ROXICODONE ) 5 MG immediate release tablet Take 1 tablet (5 mg total) by mouth every 6 (six) hours as needed for up to 7 days for moderate  pain (pain score 4-6). 07/13/24 07/20/24  Cherlyn Labella, MD  senna-docusate (SENOKOT-S) 8.6-50 MG tablet Take 1 tablet by mouth at bedtime as needed for mild constipation. 07/13/24   Akula, Vijaya, MD    Allergies: Prednisone and Statins    Review of Systems  Updated Vital Signs BP (!) 150/91 (BP Location: Left Arm)   Pulse 68   Temp 98.1 F (36.7 C)   Resp 18   SpO2 97%   Physical Exam Vitals and nursing note reviewed.  Constitutional:      Appearance: She is well-developed.  HENT:     Head: Normocephalic and atraumatic.  Pulmonary:     Effort: Pulmonary effort is normal.  Abdominal:     General: There is no distension.  Skin:    General: Skin is warm and dry.     Comments: Left foot is wrapped in the wound VAC wrapping.  No bleeding or swelling appreciated.  Neurological:     Mental Status: She is alert.     (all labs ordered are listed, but only abnormal results are displayed) Labs Reviewed - No data to display  EKG: None  Radiology: No results found.   Procedures   Medications Ordered in the ED - No data to display  Medical Decision Making  Patient will need wound VAC reapplied.  Nursing staff is getting supplies.  Otherwise patient is ready for discharge when the wound VAC is reapplied. Care transferred to Dr. Lorette.     Final diagnoses:  None    ED Discharge Orders     None          Freddi Hamilton, MD 07/13/24 2319

## 2024-07-13 NOTE — ED Provider Notes (Signed)
 11:06 PM Assumed care from Dr. Freddi, please see their note for full history, physical and decision making until this point. In brief this is a 78 y.o. year old female who presented to the ED tonight with Wound Check     Pending wound vac repair.   Wound vac functioning appropriately at this time. Will d/c w/ outpatient follow up.  Discharge instructions, including strict return precautions for new or worsening symptoms, given. Patient and/or family verbalized understanding and agreement with the plan as described.   Labs, studies and imaging reviewed by myself and considered in medical decision making if ordered. Imaging interpreted by radiology.  Labs Reviewed - No data to display  No orders to display    No follow-ups on file.    Lorette Mayo, MD 07/14/24 2322

## 2024-07-13 NOTE — Plan of Care (Signed)

## 2024-07-13 NOTE — ED Notes (Signed)
 Pt's wound vac dressing has been replaced and reinforced with tegaderm, no seal leaks present after about 30 min of observation.

## 2024-07-13 NOTE — Progress Notes (Signed)
 Mobility Specialist Progress Note:    07/13/24 0952  Mobility  Activity Ambulated with assistance  Level of Assistance Contact guard assist, steadying assist  Assistive Device Front wheel walker  Distance Ambulated (ft) 300 ft  Activity Response Tolerated well  Mobility Referral Yes  Mobility visit 1 Mobility  Mobility Specialist Start Time (ACUTE ONLY) [id number]  Mobility Specialist Stop Time (ACUTE ONLY) 1014  Mobility Specialist Time Calculation (min) (ACUTE ONLY) 22 min   Pt received in bed, agreeable to mobility session. CGA with RW for safety. Tolerated well, no c/o pain. Returned pt to room, daughter at bedside. Sitting up in chair all needs met, RN notified.   Anel Creighton Mobility Specialist Please contact via Special educational needs teacher or  Rehab office at [phone]

## 2024-07-14 NOTE — Discharge Summary (Signed)
 Physician Discharge Summary   Patient: Taylor Bush MRN: [id number] DOB: 08-12-1946  Admit date:     07/06/2024  Discharge date: 07/13/2024  Discharge Physician: Taylor Bush   PCP: Taylor Mirza, MD   Recommendations at discharge:  Please follow up with orthopedics as recommended.  Please follow up with cbc and bmp in one week.   Discharge Diagnoses: Principal Problem:   Dog bite of ankle, sequela Active Problems:   Gangrene of left foot (HCC)   Peripheral arterial disease [facility])    Hospital Course: 78 year old with history of HTN, HLD, PAD, CKD 3B, B12 deficiency comes to the hospital after suffering from left foot dog bite. She had came to the ER, wound was sutured, received tetanus shot and was discharged on Augmentin . Returns back today due to worsening of infection. Due to concerns of gangrenous ulcer on the dorsum side, Dr. Harden was consulted. Abnormal ABIs therefore vascular consulted. Aortoiliac duplex suggested severe disease underwent  left lower extremity angiogram, s/p SFA pop revascularization.  She underwent incision and drainage of the left foot by Dr Harden and she was cleared for discharge.     Assessment and Plan:  Non healing left foot wound  S/p Left SFA and popliteal artery laser atherectomy , shockwave lithotripsy and stent placement.  Orthopedics on board and and she underwent Excisional debridement left foot including multiple webspaces , with graft placement and wound vac placement by Dr Harden on 9/5.  Pain control.  Bowel regimen in place.        H/o PAD Abnormal ABI'S , LDL is 120.  Vascular surgery onboard, and she underwent Left SFA and popliteal artery laser atherectomy , shockwave lithotripsy and stent placement.  Continue with aspirin  and statin.      Hypertension Optimal.      Stage 3b CKD Creatinine at baseline      Hypomagnesemia Replaced.      Acute anemia of blood loss from the hematoma/ superimposed on anemia of chronic  disease Hemoglobin dropped to 6.9 this morning, s/p 1 unit of prbc transfusion.  Repeat hemoglobin around 8.2 to 7.9. to 7.1 this morning. 1 unit of prbc transfusion ordered. Repeat hemoglobin stable       leukocytosis.  Suspect reactive, as she has completed 8 days of IV antibiotics.  Recommend checking cbc and bmp in one week.      Estimated body mass index is 28.89 kg/m as calculated from the following:   Height as of this encounter: 5' (1.524 m).   Weight as of this encounter: 67.1 kg.     Consultants: orthopedics.  Procedures performed: incision and drainage. S/p Left SFA and popliteal artery laser atherectomy , shockwave lithotripsy and stent placement.    Disposition: Home Diet recommendation:  Discharge Diet Orders (From admission, onward)     Start     Ordered   07/13/24 0000  Diet - low sodium heart healthy        07/13/24 1634           Regular diet DISCHARGE MEDICATION: Allergies as of 07/13/2024       Reactions   Prednisone Other (See Comments)   Joint pain,joint stiffness   Statins Rash   hands broke out        Medication List     STOP taking these medications    amoxicillin -clavulanate 875-125 MG tablet Commonly known as: AUGMENTIN    traMADol  50 MG tablet Commonly known as: ULTRAM        TAKE these  medications    amLODipine  10 MG tablet Commonly known as: NORVASC  TAKE 1 TABLET BY MOUTH DAILY   aspirin  81 MG tablet Take 81 mg by mouth daily.   atorvastatin  80 MG tablet Commonly known as: LIPITOR  Take 1 tablet (80 mg total) by mouth daily.   clopidogrel  75 MG tablet Commonly known as: PLAVIX  Take 1 tablet (75 mg total) by mouth daily with breakfast.   Journavx 50 MG Tabs Generic drug: Suzetrigine Take 50 mg by mouth 2 (two) times daily as needed (Pain).   lisinopril  20 MG tablet Commonly known as: ZESTRIL  TAKE 1 TABLET BY MOUTH DAILY   metoprolol  200 MG 24 hr tablet Commonly known as: TOPROL -XL TAKE 1 TABLET BY MOUTH  DAILY   mupirocin  ointment 2 % Commonly known as: BACTROBAN  Place 1 Application into the nose 2 (two) times daily.   ondansetron  4 MG disintegrating tablet Commonly known as: ZOFRAN -ODT Take 1 tablet (4 mg total) by mouth every 8 (eight) hours as needed for nausea or vomiting.   oxyCODONE  5 MG immediate release tablet Commonly known as: Oxy IR/ROXICODONE  Take 1 tablet (5 mg total) by mouth every 6 (six) hours as needed for up to 7 days for moderate pain (pain score 4-6).   senna-docusate 8.6-50 MG tablet Commonly known as: Senokot-S Take 1 tablet by mouth at bedtime as needed for mild constipation.   VABYSMO  IZ 1 Dose by Intravitreal route every 6 (six) weeks.               Discharge Care Instructions  (From admission, onward)           Start     Ordered   07/13/24 0000  Discharge wound care:       Comments: Negative Pressure Wound Therapy With Incisional  Until discontinued      Question Answer Comment Amount of suction? 125 mm/Hg   Suction Type? Continuous   Remove Dressing? Do not remove dressing     07/12/24 1220   07/13/24 1634            Follow-up Information     Copland, Jacques, MD Follow up.   Specialties: Family Medicine, Sports Medicine Contact information: [address] [place] [address] [phone]         [facility] Follow up in 4 week(s).   Specialty: Vascular Surgery Why: Office will call to arrange your appt(s) Contact information: [address] [place]  [address] [phone]        Harden Jerona GAILS, MD Follow up in 1 week(s).   Specialty: Orthopedic Surgery Contact information: [address] [place] [address] [phone]                Discharge Exam: [name]   07/06/24 0813 07/12/24 0912  Weight: 67.1 kg 67.1 kg   General exam: Appears calm and comfortable  Respiratory system: Clear to  auscultation. Respiratory effort normal. Cardiovascular system: S1 & S2 heard, RRR. No JVD,  Gastrointestinal system: Abdomen is nondistended, soft and nontender.  Central nervous system: Alert and oriented. No focal neurological deficits. Extremities: left foot wound connected to the wound vac.  Skin: No rashes,  Psychiatry: Mood & affect appropriate.    Condition at discharge: fair  The results of significant diagnostics from this hospitalization (including imaging, microbiology, ancillary and laboratory) are listed below for reference.   Imaging Studies: PERIPHERAL VASCULAR CATHETERIZATION  Result Date: 07/09/2024 Images from the original result were not included. Patient name: TELESHA DEGUZMAN MRN: [id number] DOB: Jun 07, 1946 Sex: female 07/09/2024 Pre-operative Diagnosis: Nonhealing left leg wound Post-operative diagnosis:  Same Surgeon:  [name] Procedure Performed:  1.  Ultrasound-guided access, right femoral artery  2.  Aortobifemoral angiogram  3.  Left leg angiogram  4.  Selective injection with catheter in the left external iliac and left popliteal artery  5.  Laser atherectomy, left superficial femoral and popliteal artery  6.  Shockwave intra-arterial lithotripsy left superficial femoral-popliteal artery  7.  Stent, left superficial femoral and popliteal artery  8.  Conscious sedation, 146 minutes Indications: This is a 78 year old female with nonhealing wound on her left foot and severe peripheral vascular disease who comes in today for angiography and possible intervention Procedure:  The patient was identified in the holding area and taken to room 8.  The patient was then placed supine on the table and prepped and draped in the usual sterile fashion.  A time out was called.  Conscious sedation was administered with the use of IV fentanyl  and Versed  under continuous physician and nurse monitoring.  Heart rate, blood pressure, and oxygen saturation were continuously monitored.  Total  sedation time was 146 minutes.  Ultrasound was used to evaluate the right common femoral artery.  It was patent .  A digital ultrasound image was acquired.  A micropuncture needle was used to access the right common femoral artery under ultrasound guidance.  An 018 wire was advanced without resistance and a micropuncture sheath was placed.  The 018 wire was removed and a benson wire was placed.  The micropuncture sheath was exchanged for a 5 french sheath.  An omniflush catheter was advanced over the wire to the level of L-1.  An abdominal angiogram was obtained.  Next, using the omniflush catheter and a benson wire, the aortic bifurcation was crossed and the catheter was placed into theleft external iliac artery and left runoff was obtained. Findings:  Aortogram: Approximately 60% left renal artery stenosis.  The infrarenal abdominal aorta heavily calcified without hemodynamically significant stenosis.  Bilateral common and external iliac arteries are heavily calcified but patent without significant stenosis.  Bilateral common femoral arteries are heavily calcified  Right Lower Extremity: Not evaluated given creatinine  Left Lower Extremity: The left common femoral artery is heavily calcified without significant stenosis.  The profundofemoral artery is patent without significant stenosis.  The superficial femoral artery is diffusely disease proximally with heavy calcification.  In the mid thigh it occludes with reconstitution of the below-knee popliteal artery.  The dominant runoff is the peroneal artery Intervention: After the above images were acquired the decision was made to proceed with intervention.  A 6 French 45 cm sheath was advanced into the left external iliac artery.  The patient was fully heparinized.  I performed subintimal recanalization of the occluded popliteal artery.  This was done with an 035 Glidewire and a quick cross catheter.  I ultimately had to switch out to a Glidewire advantage for more  support to cross the lesion.  Once this was done it was confirmed with a contrast injection in the below-knee popliteal artery.  I then elected to predilate the lesion.  I was unable to get a 3 mm balloon to go through the lesion behind the patella.  I therefore elected to perform laser atherectomy.  A 2 mm laser catheter was used to perform laser atherectomy of the superficial femoral and popliteal artery.  I was unable to advance the laser catheter through the short segment calcified artery at the level of the patella.  This artery was circumferentially calcified and highly stenotic.  Next, I used a 3 mm Mustang balloon and this time was able to get a short 3 mm balloon to cross the lesion.  This was then dilated.  I switched out to an 014 platform and then performed shockwave intra-arterial lithotripsy of the popliteal and superficial femoral artery using a 4 mm balloon distally and a 5 mm more proximally.  800 pulses were delivered.  Because of the heavy calcification I elected to stent the popliteal artery beginning at the joint space extending proximally.  Two 6 x 150 Eluvia stents were placed and postdilated with a 5 mm balloon.  There was residual narrowing within the stents from the calcification and so this was treated again with a 5 mm shockwave balloon.  Completion imaging was then performed.  The patient has inline flow through the superficial femoral popliteal artery with single-vessel runoff via the peroneal artery which collateralizes nicely onto the foot.  At this point I elected to terminate the procedure.  Catheters and wires were removed.  The patient be taken the holding area for sheath pull once her coagulation profile corrects Impression:  #1  Occluded and heavily circumferentially calcified left superficial femoral and popliteal artery.  This was successfully recanalized.  The artery was treated with laser atherectomy and intra-arterial balloon angioplasty followed by stenting using 6 mm  overlapping Eluvia stents  #2  Single-vessel runoff via the peroneal artery  #3  Patient has been optimally revascularized however because of the heavily calcified lesions, I was unable to get full expansion of her stents.  She will need to be maintained on aspirin  and Plavix  in addition to a statin V. [name], M.D., [facility] [place] Office: [phone] Pager:  [phone]   VAS US  AORTA/IVC/ILIACS Result Date: 07/08/2024 ABDOMINAL AORTA STUDY Patient Name:  JACQUALYN SEDGWICK  Date of Exam:   07/08/2024 Medical Rec #: [id number]      Accession #:    [id number] Date of Birth: Jul 09, 1946      Patient Gender: F Patient Age:   30 years Exam Location:  [facility] Procedure:      VAS US  AORTA/IVC/ILIACS Referring Phys: DEBBY ROBERTSON --------------------------------------------------------------------------------  Risk Factors: Hypertension, hyperlipidemia, past history of smoking. Other Factors: 07/07/2024 ABI Right 0.70 Left 0.53. Limitations: Air/bowel gas.  Comparison Study: NA Performing Technologist: Orvil Holmes RDCS  Examination Guidelines: A complete evaluation includes B-mode imaging, spectral Doppler, color Doppler, and power Doppler as needed of all accessible portions of each vessel. Bilateral testing is considered an integral part of a complete examination. Limited examinations for reoccurring indications may be performed as noted.  Abdominal Aorta Findings: +-------------+-------+----------+----------+----------+--------+--------+ Location     AP (cm)Trans (cm)PSV (cm/s)Waveform  ThrombusComments +-------------+-------+----------+----------+----------+--------+--------+ Proximal     1.50   1.70      78        triphasic                  +-------------+-------+----------+----------+----------+--------+--------+ Mid          1.50   1.50      74        triphasic                   +-------------+-------+----------+----------+----------+--------+--------+ Distal       1.40   1.60  64        triphasic                  +-------------+-------+----------+----------+----------+--------+--------+ RT CIA Prox  1.0    1.1       231       biphasic                   +-------------+-------+----------+----------+----------+--------+--------+ RT CIA Mid                    216       monophasic                 +-------------+-------+----------+----------+----------+--------+--------+ RT CIA Distal                 187       monophasic                 +-------------+-------+----------+----------+----------+--------+--------+ RT EIA Prox                   180       monophasic                 +-------------+-------+----------+----------+----------+--------+--------+ RT EIA Mid                    126       monophasic                 +-------------+-------+----------+----------+----------+--------+--------+ RT EIA Distal                 130       monophasic                 +-------------+-------+----------+----------+----------+--------+--------+ LT CIA Prox  1.0    1.0       216       biphasic                   +-------------+-------+----------+----------+----------+--------+--------+ LT CIA Mid                    285       biphasic                   +-------------+-------+----------+----------+----------+--------+--------+ LT CIA Distal                 304       monophasic                 +-------------+-------+----------+----------+----------+--------+--------+ LT EIA Prox                   204       biphasic                   +-------------+-------+----------+----------+----------+--------+--------+ LT EIA Mid                    325       biphasic                   +-------------+-------+----------+----------+----------+--------+--------+ LT EIA Distal                 222       monophasic                  +-------------+-------+----------+----------+----------+--------+--------+  Summary: Stenosis: +-------------------+-------------+ Location           Stenosis      +-------------------+-------------+ Right Common Iliac >50%  stenosis +-------------------+-------------+ Left Common Iliac  >50% stenosis +-------------------+-------------+ Left External Iliac>50% stenosis +-------------------+-------------+   *See table(s) above for measurements and observations.  Electronically signed by Debby Robertson on 07/08/2024 at 11:33:22 AM.    Final    VAS US  ABI WITH/WO TBI Result Date: 07/08/2024  LOWER EXTREMITY DOPPLER STUDY Patient Name:  JENELLE DRENNON  Date of Exam:   07/07/2024 Medical Rec #: [id number]      Accession #:    [id number] Date of Birth: [birth year]      Patient Gender: F Patient Age:   78 years Exam Location:  [facility] Procedure:      VAS US  ABI WITH/WO TBI Referring Phys: LAVADA [facility] --------------------------------------------------------------------------------  Indications: Peripheral artery disease. Gangrenous ulcer dorsum of the left foot              status post dog bite 2 weeks ago High Risk Factors: Hypertension, hyperlipidemia, past history of smoking.  Comparison Study: Prior ABI done in 2010 Performing Technologist: Rachel Pellet RVS  Examination Guidelines: A complete evaluation includes at minimum, Doppler waveform signals and systolic blood pressure reading at the level of bilateral brachial, anterior tibial, and posterior tibial arteries, when vessel segments are accessible. Bilateral testing is considered an integral part of a complete examination. Photoelectric Plethysmograph (PPG) waveforms and toe systolic pressure readings are included as required and additional duplex testing as needed. Limited examinations for reoccurring indications may be performed as noted.  ABI Findings: +---------+------------------+-----+----------+--------+ Right    Rt Pressure  (mmHg)IndexWaveform  Comment  +---------+------------------+-----+----------+--------+ Brachial 171                    triphasic          +---------+------------------+-----+----------+--------+ PTA      85                0.50 monophasic         +---------+------------------+-----+----------+--------+ DP       119               0.70 monophasic         +---------+------------------+-----+----------+--------+ Great Toe39                0.23 Abnormal           +---------+------------------+-----+----------+--------+ +---------+------------------+-----+----------+-------+ Left     Lt Pressure (mmHg)IndexWaveform  Comment +---------+------------------+-----+----------+-------+ Brachial 164                    triphasic         +---------+------------------+-----+----------+-------+ PTA      90                0.53 monophasic        +---------+------------------+-----+----------+-------+ DP       85                0.50 monophasic        +---------+------------------+-----+----------+-------+ Great Toe0                 0.00 Absent            +---------+------------------+-----+----------+-------+ +-------+-----------+-----------+------------+------------+ ABI/TBIToday's ABIToday's TBIPrevious ABIPrevious TBI +-------+-----------+-----------+------------+------------+ Right  0.70       0.23       0.72                     +-------+-----------+-----------+------------+------------+ Left   0.53       absent     0.73                     +-------+-----------+-----------+------------+------------+  Summary: Right: Resting right ankle-brachial index indicates moderate right lower extremity arterial disease. The right toe-brachial index is abnormal.  Left: Resting left ankle-brachial index indicates moderate to severe left lower extremity arterial disease. The left toe-brachial index is abnormal.  *See table(s) above for measurements and observations.   Electronically signed by Debby Robertson on 07/08/2024 at 11:28:26 AM.    Final    DG Foot Complete Left Result Date: 06/26/2024 CLINICAL DATA:  Dog bite. EXAM: LEFT FOOT - COMPLETE 3+ VIEW COMPARISON:  None are available FINDINGS: No acute fracture or dislocation. No radiopaque foreign body. Soft tissue injury about the dorsum of the foot. IMPRESSION: No acute fracture or dislocation. Soft tissue injury about the dorsum of the foot. Electronically Signed   By: Norman Gatlin M.D.   On: 06/26/2024 00:54    Microbiology: Results for orders placed or performed during the hospital encounter of 07/06/24  Culture, blood (Routine X 2) w Reflex to ID Panel     Status: None   Collection Time: 07/06/24 12:57 PM   Specimen: BLOOD LEFT ARM  Result Value Ref Range Status   Specimen Description BLOOD LEFT ARM  Final   Special Requests   Final    BOTTLES DRAWN AEROBIC ONLY Blood Culture adequate volume   Culture   Final    NO GROWTH 5 DAYS Performed at [facility], [address] [place], [place] [address]    Report Status 07/11/2024 FINAL  Final  Surgical pcr screen     Status: Abnormal   Collection Time: 07/11/24  7:27 PM   Specimen: Nasal Mucosa; Nasal Swab  Result Value Ref Range Status   MRSA, PCR NEGATIVE NEGATIVE Final   Staphylococcus aureus POSITIVE (A) NEGATIVE Final    Comment: (NOTE) The Xpert SA Assay (FDA approved for NASAL specimens in patients 70 years of age and older), is one component of a comprehensive surveillance program. It is not intended to diagnose infection nor to guide or monitor treatment. Performed at [facility], [address] [place], [place] [address]     Labs: CBC: Recent Labs  Lab 07/08/24 [phone] 07/09/24 [id number] 07/10/24 0431 07/10/24 1529 07/11/24 0507 07/11/24 0921 07/12/24 0436 07/12/24 1745 07/13/24 0512  WBC 11.1* 9.8 13.9*  --  12.0* 13.6* 13.4*  --  15.1*  NEUTROABS 7.6 6.5 12.1*  --   --  10.1*  --   --   --   HGB 9.9* 8.9*  6.9*   < > 6.4* 7.9* 7.1* 9.1* 8.8*  HCT 29.7* 26.6* 20.9*   < > 19.4* 24.4* 21.5* 27.5* 26.4*  MCV 96.4 98.2 99.5  --  95.1 96.4 96.8  --  94.3  PLT 367 316 258  --  181 220 234  --  235   < > = values in this interval not displayed.   Basic Metabolic Panel: Recent Labs  Lab 07/08/24 0538 07/09/24 [id number] 07/10/24 0431 07/11/24 0507 07/11/24 0921 07/12/24 0436 07/13/24 0512  NA 138 136 137 147* 138 141 136  K 4.3 4.0 4.4 3.1* 3.9 4.8 4.5  CL 103 100 106 118* 107 111 107  CO2 25 21* 20* 16* 23 17* 19*  GLUCOSE 104* 105* 120* 85 122* 110* 122*  BUN 16 14 19 17 19 19 16   CREATININE 1.38* 1.50* 1.61* 1.08* 1.48* 1.38* 1.25*  CALCIUM  9.2 8.7* 8.1* 6.0* 8.5* 8.2* 8.5*  MG 2.4 2.0 1.8 1.2*  --  2.3 2.0  PHOS 3.2  --   --   --   --   --   --  Liver Function Tests: No results for input(s): AST, ALT, ALKPHOS, BILITOT, PROT, ALBUMIN in the last 168 hours. CBG: Recent Labs  Lab 07/10/24 0027  GLUCAP 133*    Discharge time spent:36 minutes.   Signed: Elgie Butter, MD [facility] 07/14/2024

## 2024-07-15 ENCOUNTER — Ambulatory Visit (INDEPENDENT_AMBULATORY_CARE_PROVIDER_SITE_OTHER): Admitting: Physician Assistant

## 2024-07-15 ENCOUNTER — Encounter (HOSPITAL_COMMUNITY): Payer: Self-pay | Admitting: Orthopedic Surgery

## 2024-07-15 DIAGNOSIS — I96 Gangrene, not elsewhere classified: Secondary | ICD-10-CM

## 2024-07-16 ENCOUNTER — Encounter: Payer: Self-pay | Admitting: Physician Assistant

## 2024-07-16 NOTE — Progress Notes (Addendum)
 Office Visit Note   Patient: Taylor Bush           Date of Birth: 1945/12/22           MRN: [id number] Visit Date: 07/15/2024              Requested by: Watt Mirza, MD [id number] [place],  [place] [address] PCP: Watt Mirza, MD  Chief Complaint  Patient presents with   Left Foot - Routine Post Op    07/12/2024 I&D left foot/dog bite      HPI: Taylor Bush is a 78 y.o. female who presents with gangrenous ulcer dorsum of the left foot.  Patient states that she was bit by her [place] about 2 weeks ago.  She underwent an angiogram with Dr. Serene on 07/09/24.  He performed   Laser atherectomy, left superficial femoral and popliteal artery, Shockwave intra-arterial lithotripsy left superficial femoral-popliteal artery, and  Stent, left superficial femoral and popliteal artery.  Once her blood flow was improved Dr. Harden performed Excisional debridement left foot including multiple web spaces, Kerecis application and wound vac dressing placement.  She was discharged from the hospital on 07/13/24   She called our office and stated she was having problems with the wound vac.  She was told to come to the office.  Assessment & Plan: Visit Diagnoses: No diagnosis found.  Plan: Change outer dressing as needed.  Elevate you foot above your heart to decrease edema.    Follow-Up Instructions: Return in about 1 week (around 07/22/2024).   Ortho Exam  Patient is alert, oriented, no adenopathy, well-dressed, normal affect, normal respiratory effort. The vac continued to have a leak.  The vac was discharged.  The wound bed was re covered with Kerecis that was there from surgery  then we applied adaptic and dry guaze with ace compression.      Imaging: No results found.   Labs: Lab Results  Component Value Date   HGBA1C 5.5 07/07/2024   HGBA1C 5.7 09/14/2023   HGBA1C 5.9 09/07/2022   CRP 0.8 07/10/2024   CRP <0.5 07/09/2024   CRP 0.5 07/08/2024   REPTSTATUS  07/11/2024 FINAL 07/06/2024   CULT  07/06/2024    NO GROWTH 5 DAYS Performed at [facility], [address], [place], [place] [address]      Lab Results  Component Value Date   ALBUMIN 3.1 (L) 07/06/2024   ALBUMIN 3.9 09/14/2023   ALBUMIN 4.1 09/07/2022    Lab Results  Component Value Date   MG 2.0 07/13/2024   MG 2.3 07/12/2024   MG 1.2 (L) 07/11/2024   Lab Results  Component Value Date   VD25OH 27.56 (L) 09/14/2023   VD25OH 37.20 09/07/2022   VD25OH 26.75 (L) 09/13/2021    No results found for: PREALBUMIN    Latest Ref Rng & Units 07/13/2024    5:12 AM 07/12/2024    5:45 PM 07/12/2024    4:36 AM  CBC EXTENDED  WBC 4.0 - 10.5 K/uL 15.1   13.4   RBC 3.87 - 5.11 MIL/uL 2.80   2.22   Hemoglobin 12.0 - 15.0 g/dL 8.8  9.1  7.1   HCT 63.9 - 46.0 % 26.4  27.5  21.5   Platelets 150 - 400 K/uL 235   234      There is no height or weight on file to calculate BMI.  Orders:  No orders of the defined types were placed in  this encounter.  No orders of the defined types were placed in this encounter.    Procedures: No procedures performed  Clinical Data: No additional findings.  ROS:  All other systems negative, except as noted in the HPI. Review of Systems  Objective: Vital Signs: There were no vitals taken for this visit.  Specialty Comments:  No specialty comments available.  PMFS History: Patient Active Problem List   Diagnosis Date Noted   Peripheral arterial disease (HCC) 07/12/2024   Dog bite of ankle, sequela 07/06/2024   Gangrene of left foot (HCC) 07/06/2024   Tongue cancer (HCC) 06/17/2024   B12 deficiency 09/13/2021   Allergic rhinitis    CKD (chronic kidney disease) stage 3, GFR 30-59 ml/min (HCC) 11/29/2013   Anemia of chronic disease 12/16/2011   Hx of total hip arthroplasty 12/15/2011   Atherosclerosis of native artery of extremity with intermittent claudication (HCC) 06/17/2009   Lumbar back pain with radiculopathy affecting left  lower extremity 06/03/2009   HYPERLIPIDEMIA 12/15/2008   Essential hypertension 12/15/2008   Open-angle glaucoma 10/27/2008   Past Medical History:  Diagnosis Date   Allergic rhinitis    Allergy    Anemia in chronic kidney disease (CKD)    Arthritis    B12 deficiency 09/13/2021   Blood transfusion 2012   and 2013   Blood transfusion without reported diagnosis    Cataract    bilateral sx   Chronic low back pain    with radiculopathy   CKD (chronic kidney disease) stage 3, GFR 30-59 ml/min (HCC) 11/29/2013   HLD (hyperlipidemia)    HTN (hypertension)    Hx of total hip arthroplasty 12/15/2011   Open-angle glaucoma    PAD (peripheral artery disease) (HCC)    with intermittent claudication, ABI 0.72 on Rt and 0.73 on L when assessed in 2010.     Family History  Problem Relation Age of Onset   Colon polyps Mother 36   Heart attack Father        20s   Stroke Neg Hx        also no PAD or aneurysm   Colon cancer Neg Hx    Esophageal cancer Neg Hx    Stomach cancer Neg Hx    Rectal cancer Neg Hx     Past Surgical History:  Procedure Laterality Date   ABDOMINAL AORTOGRAM W/LOWER EXTREMITY N/A 07/09/2024   Procedure: ABDOMINAL AORTOGRAM W/LOWER EXTREMITY;  Surgeon: Serene Gaile ORN, MD;  [facility];  Service: Cardiovascular;  Laterality: N/A;   abnormal vascular studies  05/07/2009   ABI's 70, R SFA > 50%^ blockage    APPLICATION OF WOUND VAC Left 07/12/2024   Procedure: APPLICATION, WOUND VAC;  Surgeon: Harden Jerona GAILS, MD;  [facility];  Service: Orthopedics;  Laterality: Left;   CATARACT EXTRACTION, BILATERAL  2011   EXCISION OF TONGUE LESION WITH LASER N/A 08/31/2015   Procedure: BIOPSY AND CO2 LASER OF TONGUE LESION ;  Surgeon: Ida Loader, MD;  Location: [facility];  Service: ENT;  Laterality: N/A;   EXCISION OF TONGUE LESION WITH LASER Left 06/29/2020   Procedure: EXCISION OF TONGUE LESION/CO2 laser w/biopsy;  Surgeon: Loader Ida, MD;   Location: [facility];  Service: ENT;  Laterality: Left;   EXCISION OF TONGUE LESION WITH LASER Bilateral 10/02/2023   Procedure: ORAL BIOPSY AND CO2 LASER ABLATATION OF TONGUE LESION;  Surgeon: Loader Ida, MD;  Location: [facility];  Service: ENT;  Laterality: Bilateral;  EXCISION ORAL LESION WITH CO2 LASER N/A 12/08/2021   Procedure: EXCISION OF ORAL ORAL CAVITY LESION WITH CO2 LASER;  Surgeon: Jesus Oliphant, MD;  Location: [facility];  Service: ENT;  Laterality: N/A;   EYE SURGERY     GLOSSECTOMY, PARTIAL Left 06/17/2024   Procedure: GLOSSECTOMY, PARTIAL;  Surgeon: Jesus Oliphant, MD;  Location: [facility];  Service: ENT;  Laterality: Left;   INCISION AND DRAINAGE OF WOUND Left 07/12/2024   Procedure: IRRIGATION AND DEBRIDEMENT WOUND;  Surgeon: Harden Jerona GAILS, MD;  Location: [facility] OR;  Service: Orthopedics;  Laterality: Left;  LEFT FOOT DEBRIDEMENT   JOINT REPLACEMENT     LOWER EXTREMITY ANGIOGRAPHY  07/09/2024   Procedure: Lower Extremity Angiography;  Surgeon: Serene Gaile ORN, MD;  [facility];  Service: Cardiovascular;;   LOWER EXTREMITY INTERVENTION  07/09/2024   Procedure: LOWER EXTREMITY INTERVENTION;  Surgeon: Serene Gaile ORN, MD;  [facility];  Service: Cardiovascular;;   OTHER SURGICAL HISTORY     ganglion cyst removed left wrist    PERIPHERAL INTRAVASCULAR LITHOTRIPSY  07/09/2024   Procedure: PERIPHERAL INTRAVASCULAR LITHOTRIPSY;  Surgeon: Serene Gaile ORN, MD;  [facility];  Service: Cardiovascular;;   PERIPHERAL VASCULAR ATHERECTOMY  07/09/2024   Procedure: PERIPHERAL VASCULAR ATHERECTOMY;  Surgeon: Serene Gaile ORN, MD;  [facility];  Service: Cardiovascular;;   TOTAL HIP ARTHROPLASTY  10/03/2011   Procedure: TOTAL HIP ARTHROPLASTY;  Surgeon: Dempsey GAILS Aluisio;  Location: [facility];  Service: Orthopedics;  Laterality: Right;   TOTAL HIP ARTHROPLASTY  12/21/2011   Procedure: TOTAL HIP ARTHROPLASTY;  Surgeon:  Dempsey GAILS Moan, MD;  Location: [facility];  Service: Orthopedics;  Laterality: Left;   TUBAL LIGATION     Social History   Occupational History   Not on file  Tobacco Use   Smoking status: Former    Current packs/day: 0.00    Types: Cigarettes    Quit date: 11/07/2008    Years since quitting: 15.6   Smokeless tobacco: Never  Vaping Use   Vaping status: Never Used  Substance and Sexual Activity   Alcohol  use: No   Drug use: No   Sexual activity: Not on file

## 2024-07-18 ENCOUNTER — Other Ambulatory Visit: Payer: Self-pay

## 2024-07-18 ENCOUNTER — Other Ambulatory Visit: Payer: Self-pay | Admitting: Surgery

## 2024-07-18 DIAGNOSIS — I739 Peripheral vascular disease, unspecified: Secondary | ICD-10-CM

## 2024-07-23 ENCOUNTER — Ambulatory Visit (INDEPENDENT_AMBULATORY_CARE_PROVIDER_SITE_OTHER): Admitting: Orthopedic Surgery

## 2024-07-23 ENCOUNTER — Encounter: Payer: Self-pay | Admitting: Orthopedic Surgery

## 2024-07-23 DIAGNOSIS — I96 Gangrene, not elsewhere classified: Secondary | ICD-10-CM

## 2024-07-23 MED ORDER — NITROGLYCERIN 0.2 MG/HR TD PT24: 0.2000 mg | MEDICATED_PATCH | Freq: Every day | TRANSDERMAL | 12 refills | Status: DC

## 2024-07-23 NOTE — Progress Notes (Signed)
 Office Visit Note   Patient: Taylor Bush           Date of Birth: Dec 24, 1945           MRN: [id number] Visit Date: 07/23/2024              Requested by: Watt Mirza, MD [address] [place],  [place] [address] PCP: Watt Mirza, MD  Chief Complaint  Patient presents with   Left Foot - Routine Post Op    07/12/2024 I&D left foot/dog bite      HPI: Discussed the use of AI scribe software for clinical note transcription with the patient, who gave verbal consent to proceed.  History of Present Illness Taylor Bush is a 78 year old female with vascular issues who presents with a non-healing wound on her foot.  She has ongoing circulation issues despite previous interventions to open blood vessels, with very calcified vessels affecting oxygen delivery to the wound site.  The wound is located on her foot, measuring four by four centimeters. She manages the wound by changing the bandage daily. She experiences tenderness in the area.  She uses a salve prescribed for nasal application, applying it to her nose twice a day. She uses an Ace wrap for the wound. The patch is 0.2 mg per hour.     Assessment & Plan: Visit Diagnoses:  1. Gangrene of left foot (HCC)     Plan: Assessment and Plan Assessment & Plan Non-healing lower extremity wound with peripheral vascular disease Non-healing wound complicated by peripheral vascular disease with calcified vessels. Insufficient oxygen delivery impeding healing. Debrided necrotic tendons with good bleeding. New tissue and nerve growth causing tenderness. - Scrub wound with soap and water to encourage bleeding. - Apply Vosh solution on 4x4 gauze, cover wound, wrap with Ace wrap. - Prescribe nitroglycerin  patch 0.2 mg/hr; apply topically, change daily, rotate location. - Educated on headache as a side effect of nitroglycerin ; remove patch if headache occurs, retry next day. - Follow-up in one week.      Follow-Up  Instructions: Return in about 1 week (around 07/30/2024).   Ortho Exam  Patient is alert, oriented, no adenopathy, well-dressed, normal affect, normal respiratory effort. Physical Exam SKIN: Neurotic extensor tendons debrided with good petechial bleeding. Wound size 4x4 cm after debridement.  Patient is status post endovascular intervention with extensive circumferential calcification of all arteries.  Initial orthopedic debridement on September 5.  There is fibrinous tissue covering the wound she does have varicose veins with venous stasis swelling.  There is no cellulitis odor or drainage there is fibrinous exudative tissue covering the wound bed and this was gently debrided with a 4 x 4 gauze.  The necrotic extensor tendons were excised.      Imaging: No results found.    Labs: Lab Results  Component Value Date   HGBA1C 5.5 07/07/2024   HGBA1C 5.7 09/14/2023   HGBA1C 5.9 09/07/2022   CRP 0.8 07/10/2024   CRP <0.5 07/09/2024   CRP 0.5 07/08/2024   REPTSTATUS 07/11/2024 FINAL 07/06/2024   CULT  07/06/2024    NO GROWTH 5 DAYS Performed at [facility], [address] [place], [place] [address]      Lab Results  Component Value Date   ALBUMIN 3.1 (L) 07/06/2024   ALBUMIN 3.9 09/14/2023   ALBUMIN 4.1 09/07/2022    Lab Results  Component Value Date   MG 2.0 07/13/2024   MG 2.3 07/12/2024  MG 1.2 (L) 07/11/2024   Lab Results  Component Value Date   VD25OH 27.56 (L) 09/14/2023   VD25OH 37.20 09/07/2022   VD25OH 26.75 (L) 09/13/2021    No results found for: PREALBUMIN    Latest Ref Rng & Units 07/13/2024    5:12 AM 07/12/2024    5:45 PM 07/12/2024    4:36 AM  CBC EXTENDED  WBC 4.0 - 10.5 K/uL 15.1   13.4   RBC 3.87 - 5.11 MIL/uL 2.80   2.22   Hemoglobin 12.0 - 15.0 g/dL 8.8  9.1  7.1   HCT 63.9 - 46.0 % 26.4  27.5  21.5   Platelets 150 - 400 K/uL 235   234      There is no height or weight on file to calculate BMI.  Orders:  No orders of the defined  types were placed in this encounter.  No orders of the defined types were placed in this encounter.    Procedures: No procedures performed  Clinical Data: No additional findings.  ROS:  All other systems negative, except as noted in the HPI. Review of Systems  Objective: Vital Signs: There were no vitals taken for this visit.  Specialty Comments:  No specialty comments available.  PMFS History: Patient Active Problem List   Diagnosis Date Noted   Peripheral arterial disease (HCC) 07/12/2024   Dog bite of ankle, sequela 07/06/2024   Gangrene of left foot (HCC) 07/06/2024   Tongue cancer (HCC) 06/17/2024   B12 deficiency 09/13/2021   Allergic rhinitis    CKD (chronic kidney disease) stage 3, GFR 30-59 ml/min (HCC) 11/29/2013   Anemia of chronic disease 12/16/2011   Hx of total hip arthroplasty 12/15/2011   Atherosclerosis of native artery of extremity with intermittent claudication (HCC) 06/17/2009   Lumbar back pain with radiculopathy affecting left lower extremity 06/03/2009   HYPERLIPIDEMIA 12/15/2008   Essential hypertension 12/15/2008   Open-angle glaucoma 10/27/2008   Past Medical History:  Diagnosis Date   Allergic rhinitis    Allergy    Anemia in chronic kidney disease (CKD)    Arthritis    B12 deficiency 09/13/2021   Blood transfusion 2012   and 2013   Blood transfusion without reported diagnosis    Cataract    bilateral sx   Chronic low back pain    with radiculopathy   CKD (chronic kidney disease) stage 3, GFR 30-59 ml/min (HCC) 11/29/2013   HLD (hyperlipidemia)    HTN (hypertension)    Hx of total hip arthroplasty 12/15/2011   Open-angle glaucoma    PAD (peripheral artery disease) (HCC)    with intermittent claudication, ABI 0.72 on Rt and 0.73 on L when assessed in 2010.     Family History  Problem Relation Age of Onset   Colon polyps Mother 59   Heart attack Father        24s   Stroke Neg Hx        also no PAD or aneurysm   Colon cancer  Neg Hx    Esophageal cancer Neg Hx    Stomach cancer Neg Hx    Rectal cancer Neg Hx     Past Surgical History:  Procedure Laterality Date   ABDOMINAL AORTOGRAM W/LOWER EXTREMITY N/A 07/09/2024   Procedure: ABDOMINAL AORTOGRAM W/LOWER EXTREMITY;  Surgeon: Serene Gaile ORN, MD;  [facility];  Service: Cardiovascular;  Laterality: N/A;   abnormal vascular studies  05/07/2009   ABI's 70, R SFA > 50%^ blockage  APPLICATION OF WOUND VAC Left 07/12/2024   Procedure: APPLICATION, WOUND VAC;  Surgeon: Harden Jerona GAILS, MD;  [facility];  Service: Orthopedics;  Laterality: Left;   CATARACT EXTRACTION, BILATERAL  2011   EXCISION OF TONGUE LESION WITH LASER N/A 08/31/2015   Procedure: BIOPSY AND CO2 LASER OF TONGUE LESION ;  Surgeon: Ida Loader, MD;  Location: [facility];  Service: ENT;  Laterality: N/A;   EXCISION OF TONGUE LESION WITH LASER Left 06/29/2020   Procedure: EXCISION OF TONGUE LESION/CO2 laser w/biopsy;  Surgeon: Loader Ida, MD;  Location: [facility];  Service: ENT;  Laterality: Left;   EXCISION OF TONGUE LESION WITH LASER Bilateral 10/02/2023   Procedure: ORAL BIOPSY AND CO2 LASER ABLATATION OF TONGUE LESION;  Surgeon: Loader Ida, MD;  [facility];  Service: ENT;  Laterality: Bilateral;   EXCISION ORAL LESION WITH CO2 LASER N/A 12/08/2021   Procedure: EXCISION OF ORAL ORAL CAVITY LESION WITH CO2 LASER;  Surgeon: Loader Ida, MD;  Location: [facility];  Service: ENT;  Laterality: N/A;   EYE SURGERY     GLOSSECTOMY, PARTIAL Left 06/17/2024   Procedure: GLOSSECTOMY, PARTIAL;  Surgeon: Loader Ida, MD;  Location: [facility];  Service: ENT;  Laterality: Left;   INCISION AND DRAINAGE OF WOUND Left 07/12/2024   Procedure: IRRIGATION AND DEBRIDEMENT WOUND;  Surgeon: Harden Jerona GAILS, MD;  Location: [facility];  Service: Orthopedics;  Laterality: Left;  LEFT FOOT DEBRIDEMENT   JOINT REPLACEMENT     LOWER EXTREMITY ANGIOGRAPHY  07/09/2024    Procedure: Lower Extremity Angiography;  Surgeon: Serene Gaile ORN, MD;  [facility];  Service: Cardiovascular;;   LOWER EXTREMITY INTERVENTION  07/09/2024   Procedure: LOWER EXTREMITY INTERVENTION;  Surgeon: Serene Gaile ORN, MD;  [facility];  Service: Cardiovascular;;   OTHER SURGICAL HISTORY     ganglion cyst removed left wrist    PERIPHERAL INTRAVASCULAR LITHOTRIPSY  07/09/2024   Procedure: PERIPHERAL INTRAVASCULAR LITHOTRIPSY;  Surgeon: Serene Gaile ORN, MD;  [facility];  Service: Cardiovascular;;   PERIPHERAL VASCULAR ATHERECTOMY  07/09/2024   Procedure: PERIPHERAL VASCULAR ATHERECTOMY;  Surgeon: Serene Gaile ORN, MD;  [facility];  Service: Cardiovascular;;   TOTAL HIP ARTHROPLASTY  10/03/2011   Procedure: TOTAL HIP ARTHROPLASTY;  Surgeon: Dempsey GAILS Aluisio;  [facility];  Service: Orthopedics;  Laterality: Right;   TOTAL HIP ARTHROPLASTY  12/21/2011   Procedure: TOTAL HIP ARTHROPLASTY;  Surgeon: Dempsey GAILS Moan, MD;  Location: [facility];  Service: Orthopedics;  Laterality: Left;   TUBAL LIGATION     Social History   Occupational History   Not on file  Tobacco Use   Smoking status: Former    Current packs/day: 0.00    Types: Cigarettes    Quit date: 11/07/2008    Years since quitting: 15.7   Smokeless tobacco: Never  Vaping Use   Vaping status: Never Used  Substance and Sexual Activity   Alcohol  use: No   Drug use: No   Sexual activity: Not on file

## 2024-07-23 NOTE — Addendum Note (Signed)
 Addended by: GEROME MAURILIO HERO on: 07/23/2024 03:05 PM   Modules accepted: Orders

## 2024-07-30 ENCOUNTER — Ambulatory Visit: Admitting: Orthopedic Surgery

## 2024-07-30 DIAGNOSIS — I96 Gangrene, not elsewhere classified: Secondary | ICD-10-CM

## 2024-07-30 NOTE — H&P (Signed)
 Taylor Bush is an 78 y.o. female.   Chief Complaint: Tongue cancer HPI: Chronic tongue leukoplakia, monitored for couple of years, recent biopsy revealed invasive carcinoma.  She underwent partial glossectomy.  Depth of invasion and staging on final pathology dictates diagnostic and therapeutic modified neck dissection.  Past Medical History:  Diagnosis Date   Allergic rhinitis    Allergy    Anemia in chronic kidney disease (CKD)    Arthritis    B12 deficiency 09/13/2021   Blood transfusion 2012   and 2013   Blood transfusion without reported diagnosis    Cataract    bilateral sx   Chronic low back pain    with radiculopathy   CKD (chronic kidney disease) stage 3, GFR 30-59 ml/min (HCC) 11/29/2013   HLD (hyperlipidemia)    HTN (hypertension)    Hx of total hip arthroplasty 12/15/2011   Open-angle glaucoma    PAD (peripheral artery disease)    with intermittent claudication, ABI 0.72 on Rt and 0.73 on L when assessed in 2010.     Past Surgical History:  Procedure Laterality Date   ABDOMINAL AORTOGRAM W/LOWER EXTREMITY N/A 07/09/2024   Procedure: ABDOMINAL AORTOGRAM W/LOWER EXTREMITY;  Surgeon: Serene Gaile ORN, MD;  [facility];  Service: Cardiovascular;  Laterality: N/A;   abnormal vascular studies  05/07/2009   ABI's 70, R SFA > 50%^ blockage    APPLICATION OF WOUND VAC Left 07/12/2024   Procedure: APPLICATION, WOUND VAC;  Surgeon: Harden Jerona GAILS, MD;  [facility];  Service: Orthopedics;  Laterality: Left;   CATARACT EXTRACTION, BILATERAL  2011   EXCISION OF TONGUE LESION WITH LASER N/A 08/31/2015   Procedure: BIOPSY AND CO2 LASER OF TONGUE LESION ;  Surgeon: Ida Loader, MD;  Location: [facility];  Service: ENT;  Laterality: N/A;   EXCISION OF TONGUE LESION WITH LASER Left 06/29/2020   Procedure: EXCISION OF TONGUE LESION/CO2 laser w/biopsy;  Surgeon: Loader Ida, MD;  Location: [facility];  Service: ENT;  Laterality: Left;    EXCISION OF TONGUE LESION WITH LASER Bilateral 10/02/2023   Procedure: ORAL BIOPSY AND CO2 LASER ABLATATION OF TONGUE LESION;  Surgeon: Loader Ida, MD;  [facility];  Service: ENT;  Laterality: Bilateral;   EXCISION ORAL LESION WITH CO2 LASER N/A 12/08/2021   Procedure: EXCISION OF ORAL ORAL CAVITY LESION WITH CO2 LASER;  Surgeon: Loader Ida, MD;  Location: [facility];  Service: ENT;  Laterality: N/A;   EYE SURGERY     GLOSSECTOMY, PARTIAL Left 06/17/2024   Procedure: GLOSSECTOMY, PARTIAL;  Surgeon: Loader Ida, MD;  Location: [facility];  Service: ENT;  Laterality: Left;   INCISION AND DRAINAGE OF WOUND Left 07/12/2024   Procedure: IRRIGATION AND DEBRIDEMENT WOUND;  Surgeon: Harden Jerona GAILS, MD;  Location: [facility];  Service: Orthopedics;  Laterality: Left;  LEFT FOOT DEBRIDEMENT   JOINT REPLACEMENT     LOWER EXTREMITY ANGIOGRAPHY  07/09/2024   Procedure: Lower Extremity Angiography;  Surgeon: Serene Gaile ORN, MD;  [facility];  Service: Cardiovascular;;   LOWER EXTREMITY INTERVENTION  07/09/2024   Procedure: LOWER EXTREMITY INTERVENTION;  Surgeon: Serene Gaile ORN, MD;  [facility];  Service: Cardiovascular;;   OTHER SURGICAL HISTORY     ganglion cyst removed left wrist    PERIPHERAL INTRAVASCULAR LITHOTRIPSY  07/09/2024   Procedure: PERIPHERAL INTRAVASCULAR LITHOTRIPSY;  Surgeon: Serene Gaile ORN, MD;  [facility];  Service:  Cardiovascular;;   PERIPHERAL VASCULAR ATHERECTOMY  07/09/2024   Procedure: PERIPHERAL VASCULAR ATHERECTOMY;  Surgeon: Serene Gaile ORN, MD;  [facility];  Service: Cardiovascular;;   TOTAL HIP ARTHROPLASTY  10/03/2011   Procedure: TOTAL HIP ARTHROPLASTY;  Surgeon: Dempsey GAILS Aluisio;  [facility];  Service: Orthopedics;  Laterality: Right;   TOTAL HIP ARTHROPLASTY  12/21/2011   Procedure: TOTAL HIP ARTHROPLASTY;  Surgeon: Dempsey GAILS Moan, MD;  Location[facility];  Service: Orthopedics;   Laterality: Left;   TUBAL LIGATION      Family History  Problem Relation Age of Onset   Colon polyps Mother 59   Heart attack Father        14s   Stroke Neg Hx        also no PAD or aneurysm   Colon cancer Neg Hx    Esophageal cancer Neg Hx    Stomach cancer Neg Hx    Rectal cancer Neg Hx    Social History:  reports that she quit smoking about 15 years ago. Her smoking use included cigarettes. She has never used smokeless tobacco. She reports that she does not drink alcohol  and does not use drugs.  Allergies:  Allergies  Allergen Reactions   Prednisone Other (See Comments)    Joint pain,joint stiffness   Statins Rash    hands broke out    No medications prior to admission.    No results found for this or any previous visit (from the past 48 hours). No results found.  ROS: otherwise negative  There were no vitals taken for this visit.  PHYSICAL EXAM: Overall appearance:  Healthy appearing, in no distress Head:  Normocephalic, atraumatic. Ears: External auditory canals are clear; tympanic membranes are intact and the middle ears are free of any effusion. Nose: External nose is healthy in appearance. Internal nasal exam free of any lesions or obstruction. Oral Cavity/pharynx: Surgical site has healed nicely.  There are no mucosal lesions or masses identified. Hypopharynx/Larynx: no signs of any mucosal lesions or masses identified. Vocal cords move normally. Neuro:  No identifiable neurologic deficits. Neck: No palpable neck masses.  Studies Reviewed: none    Assessment/Plan Tongue cancer.  Proceed with left modified neck dissection.  Risks and benefits were discussed in detail.  All questions were answered.  Ida Loader 07/30/2024, 9:19 AM

## 2024-07-30 NOTE — H&P (View-Only) (Signed)
 Taylor Bush is an 78 y.o. female.   Chief Complaint: Tongue cancer HPI: Chronic tongue leukoplakia, monitored for couple of years, recent biopsy revealed invasive carcinoma.  She underwent partial glossectomy.  Depth of invasion and staging on final pathology dictates diagnostic and therapeutic modified neck dissection.  Past Medical History:  Diagnosis Date   Allergic rhinitis    Allergy    Anemia in chronic kidney disease (CKD)    Arthritis    B12 deficiency 09/13/2021   Blood transfusion 2012   and 2013   Blood transfusion without reported diagnosis    Cataract    bilateral sx   Chronic low back pain    with radiculopathy   CKD (chronic kidney disease) stage 3, GFR 30-59 ml/min (HCC) 11/29/2013   HLD (hyperlipidemia)    HTN (hypertension)    Hx of total hip arthroplasty 12/15/2011   Open-angle glaucoma    PAD (peripheral artery disease)    with intermittent claudication, ABI 0.72 on Rt and 0.73 on L when assessed in 2010.     Past Surgical History:  Procedure Laterality Date   ABDOMINAL AORTOGRAM W/LOWER EXTREMITY N/A 07/09/2024   Procedure: ABDOMINAL AORTOGRAM W/LOWER EXTREMITY;  Surgeon: Serene Gaile ORN, MD;  [facility];  Service: Cardiovascular;  Laterality: N/A;   abnormal vascular studies  05/07/2009   ABI's 70, R SFA > 50%^ blockage    APPLICATION OF WOUND VAC Left 07/12/2024   Procedure: APPLICATION, WOUND VAC;  Surgeon: Harden Jerona GAILS, MD;  [facility];  Service: Orthopedics;  Laterality: Left;   CATARACT EXTRACTION, BILATERAL  2011   EXCISION OF TONGUE LESION WITH LASER N/A 08/31/2015   Procedure: BIOPSY AND CO2 LASER OF TONGUE LESION ;  Surgeon: Ida Loader, MD;  Location: [facility];  Service: ENT;  Laterality: N/A;   EXCISION OF TONGUE LESION WITH LASER Left 06/29/2020   Procedure: EXCISION OF TONGUE LESION/CO2 laser w/biopsy;  Surgeon: Loader Ida, MD;  Location: [facility];  Service: ENT;  Laterality: Left;    EXCISION OF TONGUE LESION WITH LASER Bilateral 10/02/2023   Procedure: ORAL BIOPSY AND CO2 LASER ABLATATION OF TONGUE LESION;  Surgeon: Loader Ida, MD;  [facility];  Service: ENT;  Laterality: Bilateral;   EXCISION ORAL LESION WITH CO2 LASER N/A 12/08/2021   Procedure: EXCISION OF ORAL ORAL CAVITY LESION WITH CO2 LASER;  Surgeon: Loader Ida, MD;  Location: [facility];  Service: ENT;  Laterality: N/A;   EYE SURGERY     GLOSSECTOMY, PARTIAL Left 06/17/2024   Procedure: GLOSSECTOMY, PARTIAL;  Surgeon: Loader Ida, MD;  Location: [facility] OR;  Service: ENT;  Laterality: Left;   INCISION AND DRAINAGE OF WOUND Left 07/12/2024   Procedure: IRRIGATION AND DEBRIDEMENT WOUND;  Surgeon: Harden Jerona GAILS, MD;  Location: [facility] OR;  Service: Orthopedics;  Laterality: Left;  LEFT FOOT DEBRIDEMENT   JOINT REPLACEMENT     LOWER EXTREMITY ANGIOGRAPHY  07/09/2024   Procedure: Lower Extremity Angiography;  Surgeon: Serene Gaile ORN, MD;  [facility];  Service: Cardiovascular;;   LOWER EXTREMITY INTERVENTION  07/09/2024   Procedure: LOWER EXTREMITY INTERVENTION;  Surgeon: Serene Gaile ORN, MD;  [facility];  Service: Cardiovascular;;   OTHER SURGICAL HISTORY     ganglion cyst removed left wrist    PERIPHERAL INTRAVASCULAR LITHOTRIPSY  07/09/2024   Procedure: PERIPHERAL INTRAVASCULAR LITHOTRIPSY;  Surgeon: Serene Gaile ORN, MD;  [facility];  Service:  Cardiovascular;;   PERIPHERAL VASCULAR ATHERECTOMY  07/09/2024   Procedure: PERIPHERAL VASCULAR ATHERECTOMY;  Surgeon: Serene Gaile ORN, MD;  [facility];  Service: Cardiovascular;;   TOTAL HIP ARTHROPLASTY  10/03/2011   Procedure: TOTAL HIP ARTHROPLASTY;  Surgeon: Dempsey GAILS Aluisio;  [facility];  Service: Orthopedics;  Laterality: Right;   TOTAL HIP ARTHROPLASTY  12/21/2011   Procedure: TOTAL HIP ARTHROPLASTY;  Surgeon: Dempsey GAILS Moan, MD;  [facility];  Service: Orthopedics;   Laterality: Left;   TUBAL LIGATION      Family History  Problem Relation Age of Onset   Colon polyps Mother 59   Heart attack Father        14s   Stroke Neg Hx        also no PAD or aneurysm   Colon cancer Neg Hx    Esophageal cancer Neg Hx    Stomach cancer Neg Hx    Rectal cancer Neg Hx    Social History:  reports that she quit smoking about 15 years ago. Her smoking use included cigarettes. She has never used smokeless tobacco. She reports that she does not drink alcohol  and does not use drugs.  Allergies:  Allergies  Allergen Reactions   Prednisone Other (See Comments)    Joint pain,joint stiffness   Statins Rash    hands broke out    No medications prior to admission.    No results found for this or any previous visit (from the past 48 hours). No results found.  ROS: otherwise negative  There were no vitals taken for this visit.  PHYSICAL EXAM: Overall appearance:  Healthy appearing, in no distress Head:  Normocephalic, atraumatic. Ears: External auditory canals are clear; tympanic membranes are intact and the middle ears are free of any effusion. Nose: External nose is healthy in appearance. Internal nasal exam free of any lesions or obstruction. Oral Cavity/pharynx: Surgical site has healed nicely.  There are no mucosal lesions or masses identified. Hypopharynx/Larynx: no signs of any mucosal lesions or masses identified. Vocal cords move normally. Neuro:  No identifiable neurologic deficits. Neck: No palpable neck masses.  Studies Reviewed: none    Assessment/Plan Tongue cancer.  Proceed with left modified neck dissection.  Risks and benefits were discussed in detail.  All questions were answered.  Ida Loader 07/30/2024, 9:19 AM

## 2024-08-04 ENCOUNTER — Other Ambulatory Visit: Payer: Self-pay | Admitting: Family Medicine

## 2024-08-04 ENCOUNTER — Encounter: Payer: Self-pay | Admitting: Orthopedic Surgery

## 2024-08-04 NOTE — Progress Notes (Signed)
 Office Visit Note   Patient: Taylor Bush           Date of Birth: [birth year]           MRN: [id number] Visit Date: 07/30/2024              Requested by: Watt Mirza, MD [address] [place],  [place] [address] PCP: Watt Mirza, MD  Chief Complaint  Patient presents with   Left Foot - Routine Post Op    07/12/24 I&D left foot dog bite      HPI: Discussed the use of AI scribe software for clinical note transcription with the patient, who gave verbal consent to proceed.  History of Present Illness SHARENA Bush is a 78 year old female who presents with swelling and a wound on her foot.  She has significant swelling in her foot, which she manages by keeping it elevated above her heart.  She uses nitroglycerin  patches without any side effects. For dressing changes, she uses Theatre stage manager. She washes the wound with soap and water, scrubs off the yellow film, and applies Bosch to the gauze before wrapping it with an Ace Wrap.     Assessment & Plan: Visit Diagnoses:  1. Gangrene of left foot (HCC)     Plan: Assessment and Plan Assessment & Plan Chronic non-healing foot ulcer with localized edema Ulcer improving with granulation tissue, measures 4x4 cm, no cellulitis, effective debridement. - Continue nitroglycerin  patch. - Perform dressing changes with Edrick. - Elevate foot above heart when sitting or lying. - Encourage toe and ankle movement to reduce swelling. - Wash with soap and water, scrub yellow film, apply dampened gauze with Vosh. - Apply Ace Wrap post-dressing changes. - Follow-up in two weeks.      Follow-Up Instructions: Return in about 2 weeks (around 08/13/2024).   Ortho Exam  Patient is alert, oriented, no adenopathy, well-dressed, normal affect, normal respiratory effort. Physical Exam EXTREMITIES: Pitting edema with swelling in the foot. SKIN: Wound bed with improved granulation tissue. Healthy granulation tissue around wound edges after  debridement. Wound measures 4x4 cm. No cellulitis.      Imaging: No results found. No images are attached to the encounter.  Labs: Lab Results  Component Value Date   HGBA1C 5.5 07/07/2024   HGBA1C 5.7 09/14/2023   HGBA1C 5.9 09/07/2022   CRP 0.8 07/10/2024   CRP <0.5 07/09/2024   CRP 0.5 07/08/2024   REPTSTATUS 07/11/2024 FINAL 07/06/2024   CULT  07/06/2024    NO GROWTH 5 DAYS Performed at [facility], [address], [place], [place] [address]      Lab Results  Component Value Date   ALBUMIN 3.1 (L) 07/06/2024   ALBUMIN 3.9 09/14/2023   ALBUMIN 4.1 09/07/2022    Lab Results  Component Value Date   MG 2.0 07/13/2024   MG 2.3 07/12/2024   MG 1.2 (L) 07/11/2024   Lab Results  Component Value Date   VD25OH 27.56 (L) 09/14/2023   VD25OH 37.20 09/07/2022   VD25OH 26.75 (L) 09/13/2021    No results found for: PREALBUMIN    Latest Ref Rng & Units 07/13/2024    5:12 AM 07/12/2024    5:45 PM 07/12/2024    4:36 AM  CBC EXTENDED  WBC 4.0 - 10.5 K/uL 15.1   13.4   RBC 3.87 - 5.11 MIL/uL 2.80   2.22   Hemoglobin 12.0 - 15.0 g/dL 8.8  9.1  7.1   HCT  36.0 - 46.0 % 26.4  27.5  21.5   Platelets 150 - 400 K/uL 235   234      There is no height or weight on file to calculate BMI.  Orders:  No orders of the defined types were placed in this encounter.  No orders of the defined types were placed in this encounter.    Procedures: No procedures performed  Clinical Data: No additional findings.  ROS:  All other systems negative, except as noted in the HPI. Review of Systems  Objective: Vital Signs: There were no vitals taken for this visit.  Specialty Comments:  No specialty comments available.  PMFS History: Patient Active Problem List   Diagnosis Date Noted   Peripheral arterial disease 07/12/2024   Dog bite of ankle, sequela 07/06/2024   Gangrene of left foot (HCC) 07/06/2024   Tongue cancer (HCC) 06/17/2024   B12 deficiency 09/13/2021    Allergic rhinitis    CKD (chronic kidney disease) stage 3, GFR 30-59 ml/min (HCC) 11/29/2013   Anemia of chronic disease 12/16/2011   Hx of total hip arthroplasty 12/15/2011   Atherosclerosis of native artery of extremity with intermittent claudication 06/17/2009   Lumbar back pain with radiculopathy affecting left lower extremity 06/03/2009   HYPERLIPIDEMIA 12/15/2008   Essential hypertension 12/15/2008   Open-angle glaucoma 10/27/2008   Past Medical History:  Diagnosis Date   Allergic rhinitis    Allergy    Anemia in chronic kidney disease (CKD)    Arthritis    B12 deficiency 09/13/2021   Blood transfusion 2012   and 2013   Blood transfusion without reported diagnosis    Cataract    bilateral sx   Chronic low back pain    with radiculopathy   CKD (chronic kidney disease) stage 3, GFR 30-59 ml/min (HCC) 11/29/2013   HLD (hyperlipidemia)    HTN (hypertension)    Hx of total hip arthroplasty 12/15/2011   Open-angle glaucoma    PAD (peripheral artery disease)    with intermittent claudication, ABI 0.72 on Rt and 0.73 on L when assessed in 2010.     Family History  Problem Relation Age of Onset   Colon polyps Mother 8   Heart attack Father        74s   Stroke Neg Hx        also no PAD or aneurysm   Colon cancer Neg Hx    Esophageal cancer Neg Hx    Stomach cancer Neg Hx    Rectal cancer Neg Hx     Past Surgical History:  Procedure Laterality Date   ABDOMINAL AORTOGRAM W/LOWER EXTREMITY N/A 07/09/2024   Procedure: ABDOMINAL AORTOGRAM W/LOWER EXTREMITY;  Surgeon: Serene Gaile ORN, MD;  [facility];  Service: Cardiovascular;  Laterality: N/A;   abnormal vascular studies  05/07/2009   ABI's 70, R SFA > 50%^ blockage    APPLICATION OF WOUND VAC Left 07/12/2024   Procedure: APPLICATION, WOUND VAC;  Surgeon: Harden Jerona GAILS, MD;  [facility];  Service: Orthopedics;  Laterality: Left;   CATARACT EXTRACTION, BILATERAL  2011   EXCISION OF TONGUE LESION WITH  LASER N/A 08/31/2015   Procedure: BIOPSY AND CO2 LASER OF TONGUE LESION ;  Surgeon: Ida Loader, MD;  Location: [facility];  Service: ENT;  Laterality: N/A;   EXCISION OF TONGUE LESION WITH LASER Left 06/29/2020   Procedure: EXCISION OF TONGUE LESION/CO2 laser w/biopsy;  Surgeon: Loader Ida, MD;  Location: [facility];  Service: ENT;  Laterality: Left;   EXCISION OF TONGUE LESION WITH LASER Bilateral 10/02/2023   Procedure: ORAL BIOPSY AND CO2 LASER ABLATATION OF TONGUE LESION;  Surgeon: Jesus Oliphant, MD;  [facility];  Service: ENT;  Laterality: Bilateral;   EXCISION ORAL LESION WITH CO2 LASER N/A 12/08/2021   Procedure: EXCISION OF ORAL ORAL CAVITY LESION WITH CO2 LASER;  Surgeon: Jesus Oliphant, MD;  Location: [facility];  Service: ENT;  Laterality: N/A;   EYE SURGERY     GLOSSECTOMY, PARTIAL Left 06/17/2024   Procedure: GLOSSECTOMY, PARTIAL;  Surgeon: Jesus Oliphant, MD;  Location: [facility] OR;  Service: ENT;  Laterality: Left;   INCISION AND DRAINAGE OF WOUND Left 07/12/2024   Procedure: IRRIGATION AND DEBRIDEMENT WOUND;  Surgeon: Harden Jerona GAILS, MD;  Location: [facility] OR;  Service: Orthopedics;  Laterality: Left;  LEFT FOOT DEBRIDEMENT   JOINT REPLACEMENT     LOWER EXTREMITY ANGIOGRAPHY  07/09/2024   Procedure: Lower Extremity Angiography;  Surgeon: Serene Gaile ORN, MD;  [facility];  Service: Cardiovascular;;   LOWER EXTREMITY INTERVENTION  07/09/2024   Procedure: LOWER EXTREMITY INTERVENTION;  Surgeon: Serene Gaile ORN, MD;  [facility];  Service: Cardiovascular;;   OTHER SURGICAL HISTORY     ganglion cyst removed left wrist    PERIPHERAL INTRAVASCULAR LITHOTRIPSY  07/09/2024   Procedure: PERIPHERAL INTRAVASCULAR LITHOTRIPSY;  Surgeon: Serene Gaile ORN, MD;  [facility];  Service: Cardiovascular;;   PERIPHERAL VASCULAR ATHERECTOMY  07/09/2024   Procedure: PERIPHERAL VASCULAR ATHERECTOMY;  Surgeon: Serene Gaile ORN, MD;  [facility];  Service: Cardiovascular;;   TOTAL HIP ARTHROPLASTY  10/03/2011   Procedure: TOTAL HIP ARTHROPLASTY;  Surgeon: Dempsey GAILS Aluisio;  [facility];  Service: Orthopedics;  Laterality: Right;   TOTAL HIP ARTHROPLASTY  12/21/2011   Procedure: TOTAL HIP ARTHROPLASTY;  Surgeon: Dempsey GAILS Moan, MD;  Location: [facility];  Service: Orthopedics;  Laterality: Left;   TUBAL LIGATION     Social History   Occupational History   Not on file  Tobacco Use   Smoking status: Former    Current packs/day: 0.00    Types: Cigarettes    Quit date: 11/07/2008    Years since quitting: 15.7   Smokeless tobacco: Never  Vaping Use   Vaping status: Never Used  Substance and Sexual Activity   Alcohol  use: No   Drug use: No   Sexual activity: Not on file

## 2024-08-12 ENCOUNTER — Ambulatory Visit (INDEPENDENT_AMBULATORY_CARE_PROVIDER_SITE_OTHER): Admitting: Orthopedic Surgery

## 2024-08-12 ENCOUNTER — Other Ambulatory Visit: Payer: Self-pay

## 2024-08-12 ENCOUNTER — Encounter (HOSPITAL_COMMUNITY): Payer: Self-pay | Admitting: Otolaryngology

## 2024-08-12 ENCOUNTER — Encounter: Payer: Self-pay | Admitting: Orthopedic Surgery

## 2024-08-12 DIAGNOSIS — W540XXA Bitten by dog, initial encounter: Secondary | ICD-10-CM

## 2024-08-12 DIAGNOSIS — I96 Gangrene, not elsewhere classified: Secondary | ICD-10-CM

## 2024-08-12 DIAGNOSIS — S91352A Open bite, left foot, initial encounter: Secondary | ICD-10-CM

## 2024-08-12 NOTE — Progress Notes (Unsigned)
 Office Visit Note   Patient: Taylor Bush           Date of Birth: 02/12/1946           MRN: [id number] Visit Date: 08/12/2024              Requested by: Watt Mirza, MD [address] [place],  [place] [address] PCP: Watt Mirza, MD  Chief Complaint  Patient presents with   Left Foot - Routine Post Op    07/12/24 I&D left foot dog bite      HPI: Discussed the use of AI scribe software for clinical note transcription with the patient, who gave verbal consent to proceed.  History of Present Illness Taylor Bush is a 78 year old female who presents for follow-up of a left foot wound post-debridement from a dog bite.  She is four weeks status post debridement of a left foot wound resulting from a dog bite. The wound currently measures 3.5 by 4 centimeters.     Assessment & Plan: Visit Diagnoses:  1. Gangrene of left foot (HCC)   2. Dog bite of left foot, initial encounter     Plan: Assessment and Plan Assessment & Plan Left foot wound, status post debridement, healing Four weeks post-debridement with 75% granulation tissue, no epivaly, cellulitis, or drainage. Wound measures 3.5 by 4 cm, healing well. - Continue Vosh dressing changes daily. - Follow up in two weeks.  Will process bili investigation for tomorrow in office use of [employer].      Follow-Up Instructions: Return in about 2 weeks (around 08/26/2024).   Ortho Exam  Patient is alert, oriented, no adenopathy, well-dressed, normal affect, normal respiratory effort. Physical Exam SKIN: Left foot wound post debridement with 75% granulation tissue, no epivaly, no cellulitis, no drainage, measuring 3.5x4 cm, with healthy red edges.      Imaging: No results found.   Labs: Lab Results  Component Value Date   HGBA1C 5.5 07/07/2024   HGBA1C 5.7 09/14/2023   HGBA1C 5.9 09/07/2022   CRP 0.8 07/10/2024   CRP <0.5 07/09/2024   CRP 0.5 07/08/2024   REPTSTATUS 07/11/2024 FINAL 07/06/2024    CULT  07/06/2024    NO GROWTH 5 DAYS Performed at [facility], [address] [place], [place] [address]      Lab Results  Component Value Date   ALBUMIN 3.1 (L) 07/06/2024   ALBUMIN 3.9 09/14/2023   ALBUMIN 4.1 09/07/2022    Lab Results  Component Value Date   MG 2.0 07/13/2024   MG 2.3 07/12/2024   MG 1.2 (L) 07/11/2024   Lab Results  Component Value Date   VD25OH 27.56 (L) 09/14/2023   VD25OH 37.20 09/07/2022   VD25OH 26.75 (L) 09/13/2021    No results found for: PREALBUMIN    Latest Ref Rng & Units 07/13/2024    5:12 AM 07/12/2024    5:45 PM 07/12/2024    4:36 AM  CBC EXTENDED  WBC 4.0 - 10.5 K/uL 15.1   13.4   RBC 3.87 - 5.11 MIL/uL 2.80   2.22   Hemoglobin 12.0 - 15.0 g/dL 8.8  9.1  7.1   HCT 63.9 - 46.0 % 26.4  27.5  21.5   Platelets 150 - 400 K/uL 235   234      There is no height or weight on file to calculate BMI.  Orders:  No orders of the defined types were placed in this encounter.  No  orders of the defined types were placed in this encounter.    Procedures: No procedures performed  Clinical Data: No additional findings.  ROS:  All other systems negative, except as noted in the HPI. Review of Systems  Objective: Vital Signs: There were no vitals taken for this visit.  Specialty Comments:  No specialty comments available.  PMFS History: Patient Active Problem List   Diagnosis Date Noted   Peripheral arterial disease 07/12/2024   Dog bite of ankle, sequela 07/06/2024   Gangrene of left foot (HCC) 07/06/2024   Tongue cancer (HCC) 06/17/2024   B12 deficiency 09/13/2021   Allergic rhinitis    CKD (chronic kidney disease) stage 3, GFR 30-59 ml/min (HCC) 11/29/2013   Anemia of chronic disease 12/16/2011   Hx of total hip arthroplasty 12/15/2011   Atherosclerosis of native artery of extremity with intermittent claudication 06/17/2009   Lumbar back pain with radiculopathy affecting left lower extremity 06/03/2009    HYPERLIPIDEMIA 12/15/2008   Essential hypertension 12/15/2008   Open-angle glaucoma 10/27/2008   Past Medical History:  Diagnosis Date   Allergic rhinitis    Allergy    Anemia in chronic kidney disease (CKD)    Arthritis    B12 deficiency 09/13/2021   Blood transfusion 2012   and 2013   Blood transfusion without reported diagnosis    Cataract    bilateral sx   Chronic low back pain    with radiculopathy   CKD (chronic kidney disease) stage 3, GFR 30-59 ml/min (HCC) 11/29/2013   HLD (hyperlipidemia)    HTN (hypertension)    Hx of total hip arthroplasty 12/15/2011   Open-angle glaucoma    PAD (peripheral artery disease)    with intermittent claudication, ABI 0.72 on Rt and 0.73 on L when assessed in 2010.     Family History  Problem Relation Age of Onset   Colon polyps Mother 26   Heart attack Father        77s   Stroke Neg Hx        also no PAD or aneurysm   Colon cancer Neg Hx    Esophageal cancer Neg Hx    Stomach cancer Neg Hx    Rectal cancer Neg Hx     Past Surgical History:  Procedure Laterality Date   ABDOMINAL AORTOGRAM W/LOWER EXTREMITY N/A 07/09/2024   Procedure: ABDOMINAL AORTOGRAM W/LOWER EXTREMITY;  Surgeon: Serene Gaile ORN, MD;  [facility];  Service: Cardiovascular;  Laterality: N/A;   abnormal vascular studies  05/07/2009   ABI's 70, R SFA > 50%^ blockage    APPLICATION OF WOUND VAC Left 07/12/2024   Procedure: APPLICATION, WOUND VAC;  Surgeon: Harden Jerona GAILS, MD;  [facility];  Service: Orthopedics;  Laterality: Left;   CATARACT EXTRACTION, BILATERAL  2011   EXCISION OF TONGUE LESION WITH LASER N/A 08/31/2015   Procedure: BIOPSY AND CO2 LASER OF TONGUE LESION ;  Surgeon: Ida Loader, MD;  Location: [facility];  Service: ENT;  Laterality: N/A;   EXCISION OF TONGUE LESION WITH LASER Left 06/29/2020   Procedure: EXCISION OF TONGUE LESION/CO2 laser w/biopsy;  Surgeon: Loader Ida, MD;  Location: [facility];   Service: ENT;  Laterality: Left;   EXCISION OF TONGUE LESION WITH LASER Bilateral 10/02/2023   Procedure: ORAL BIOPSY AND CO2 LASER ABLATATION OF TONGUE LESION;  Surgeon: Loader Ida, MD;  [facility];  Service: ENT;  Laterality: Bilateral;   EXCISION ORAL LESION WITH CO2 LASER  N/A 12/08/2021   Procedure: EXCISION OF ORAL ORAL CAVITY LESION WITH CO2 LASER;  Surgeon: Jesus Oliphant, MD;  Location: [facility];  Service: ENT;  Laterality: N/A;   EYE SURGERY     GLOSSECTOMY, PARTIAL Left 06/17/2024   Procedure: GLOSSECTOMY, PARTIAL;  Surgeon: Jesus Oliphant, MD;  Location: [facility];  Service: ENT;  Laterality: Left;   INCISION AND DRAINAGE OF WOUND Left 07/12/2024   Procedure: IRRIGATION AND DEBRIDEMENT WOUND;  Surgeon: Harden Jerona GAILS, MD;  Location: [facility];  Service: Orthopedics;  Laterality: Left;  LEFT FOOT DEBRIDEMENT   JOINT REPLACEMENT     LOWER EXTREMITY ANGIOGRAPHY  07/09/2024   Procedure: Lower Extremity Angiography;  Surgeon: Serene Gaile ORN, MD;  [facility];  Service: Cardiovascular;;   LOWER EXTREMITY INTERVENTION  07/09/2024   Procedure: LOWER EXTREMITY INTERVENTION;  Surgeon: Serene Gaile ORN, MD;  [facility];  Service: Cardiovascular;;   OTHER SURGICAL HISTORY     ganglion cyst removed left wrist    PERIPHERAL INTRAVASCULAR LITHOTRIPSY  07/09/2024   Procedure: PERIPHERAL INTRAVASCULAR LITHOTRIPSY;  Surgeon: Serene Gaile ORN, MD;  [facility];  Service: Cardiovascular;;   PERIPHERAL VASCULAR ATHERECTOMY  07/09/2024   Procedure: PERIPHERAL VASCULAR ATHERECTOMY;  Surgeon: Serene Gaile ORN, MD;  [facility];  Service: Cardiovascular;;   TOTAL HIP ARTHROPLASTY  10/03/2011   Procedure: TOTAL HIP ARTHROPLASTY;  Surgeon: Dempsey GAILS Aluisio;  [facility];  Service: Orthopedics;  Laterality: Right;   TOTAL HIP ARTHROPLASTY  12/21/2011   Procedure: TOTAL HIP ARTHROPLASTY;  Surgeon: Dempsey GAILS Moan, MD;  [facility];  Service: Orthopedics;  Laterality: Left;   TUBAL LIGATION     Social History   Occupational History   Not on file  Tobacco Use   Smoking status: Former    Current packs/day: 0.00    Types: Cigarettes    Quit date: 11/07/2008    Years since quitting: 15.7   Smokeless tobacco: Never  Vaping Use   Vaping status: Never Used  Substance and Sexual Activity   Alcohol  use: No   Drug use: No   Sexual activity: Not on file

## 2024-08-12 NOTE — Progress Notes (Signed)
 SDW CALL  Patient was given pre-op instructions over the phone. The opportunity was given for the patient to ask questions. No further questions asked. Patient verbalized understanding of instructions given.   PCP - Dr. Jacques Copland Cardiologist - denies  PPM/ICD - denies   Chest x-ray - denies EKG - 06/17/24 Stress Test - 09/13/2011 ECHO - denies Cardiac Cath - denies  Sleep Study - denies  No DM  Last dose of GLP1 agonist-  n/a GLP1 instructions:  n/a  Blood Thinner Instructions: patient is currently on Aspirin  and Plavix  and has not received instructions on when to stop medication.  Left message at Dr. Godfrey office with Avelina for further instructions   ERAS Protcol - NPO PRE-SURGERY Ensure or G2-  n/a  COVID TEST-  n/a   Anesthesia review: yes - PAD, HTN, CKD, Plavix   Patient denies shortness of breath, fever, cough and chest pain over the phone call   All instructions explained to the patient, with a verbal understanding of the material. Patient agrees to go over the instructions while at home for a better understanding.

## 2024-08-13 ENCOUNTER — Encounter: Payer: Self-pay | Admitting: Orthopedic Surgery

## 2024-08-13 NOTE — Progress Notes (Addendum)
 Anesthesia Chart Review: Taylor Bush  Case: [id number] Date/Time: 08/14/24 1201   Procedure: DISSECTION, NECK, RADICAL (Left)   Anesthesia type: General   Diagnosis: Tongue cancer (HCC) [C02.9]   Pre-op diagnosis: Tongue cancer   [facility]   Surgeons: Taylor Oliphant, MD       DISCUSSION: Patient is a 78 year old female scheduled for the above procedure. S/p oral biopsy with CO2 laser ablation of tongue lesion on 10/02/23 showing moderate squamous dysplasia. Evaluation by Dr. Jesus on 06/13/2024 showed a new tongue mass, left partial glossectomy on 06/17/2024. 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   History includes former smoker (quit 11/07/08), HTN, HLD, PAD (s/p left SFA and popliteal artery laser atherectomy, shockwave lithotripsy, and overlapping Eluvia stents 07/09/2024), CKD (stage 3), anemia, open angle glaucoma, allergic rhinitis, osteoarthritis (right THA 10/03/11, left THA 12/21/11), tongue cancer (s/p left partial glossectomy 06/17/24).   On 06/26/2024 she sustained a dog bit from her [place] shepard. Xray showed no acute bony findings and neurovascular exam intact. Laceration repaired and given Tetanus vaccine and started on Augmentin . She was subsequently admitted 07/06/24-07/13/24 for left foot gangrenous ulceration. She was started on IV antibiotics and local wound care. Ortho and vascular surgery consulted. ABIs suggested moderate-severe arterial disease. S/p s/p left SFA and popliteal artery laser atherectomy, shockwave lithotripsy, and stenting 07/09/24. S/p I&D of left food wound with placement of wound VAC on 07/12/24.  Discharge medications included aspirin  81 mg, Plavix , atorvastatin .   On 08/12/24, patient reported that she had not received perioperative ASA and Plavix  instructions. ENT is wanting to her to hold ASA and Plavix   for 5 days prior to surgery. Message left for Taylor Bush at Dr. Godfrey office advising communication with vascular surgery if not done so already since Plavix  was started after recent LLE stent. Currently, it appears surgery is being rescheduled for 08/21/24.    VS:  Wt Readings from Last 3 Encounters:  07/12/24 67.1 kg  06/26/24 67.1 kg  06/17/24 67.1 kg   BP Readings from Last 3 Encounters:  07/13/24 (!) 150/91  07/13/24 (!) 145/61  06/26/24 (!) 166/75   Pulse Readings from Last 3 Encounters:  07/13/24 68  07/13/24 (!) 58  06/26/24 67    PROVIDERS: Taylor Mirza, MD is PCP  Taylor Capri, MD is nephrologist  Taylor Oliphant, MD is ENT Taylor Lame, MD is orthopedic surgeon Taylor GAILS. Malvina, MD is vascular surgeon (placed LLE stent 07/09/2024, although initial consultation by Taylor Ned, MD)   LABS: For day of surgery. Most recent results in [facility] include: Lab Results  Component Value Date   WBC 15.1 (H) 07/13/2024   HGB 8.8 (L) 07/13/2024   HCT 26.4 (L) 07/13/2024   PLT 235 07/13/2024   GLUCOSE 122 (H) 07/13/2024   CHOL 129 07/10/2024   TRIG 123 07/10/2024   HDL 41 07/10/2024   LDLDIRECT 163.0 09/13/2021   LDLCALC 63 07/10/2024   ALT 13 07/06/2024   AST 17 07/06/2024   NA 136 07/13/2024   K 4.5 07/13/2024   CL 107 07/13/2024   CREATININE 1.25 (H) 07/13/2024   BUN 16 07/13/2024   CO2 19 (L) 07/13/2024   TSH 2.28 09/14/2023   INR 0.9 07/07/2024   HGBA1C 5.5 07/07/2024    EKG: 06/17/2024: Sinus bradycardia at 58 bpm  Otherwise normal ECG When compared with ECG of 26-Sep-2023 10:19, No  significant change was found Confirmed by Taylor Bush [phone]) on 06/21/2024 1:25:15 PM    CV: Aortobifemoral Angiogram with LLE angiogram 07/09/24: Impression:             #1  Occluded and heavily circumferentially calcified left superficial femoral and popliteal artery.  This was successfully recanalized.  The artery was treated with laser atherectomy and  intra-arterial balloon angioplasty followed by stenting using 6 mm overlapping Eluvia stents             #2  Single-vessel runoff via the peroneal artery             #3  Patient has been optimally revascularized however because of the heavily calcified lesions, I was unable to get full expansion of her stents.  She will need to be maintained on aspirin  and Plavix  in addition to a statin    Nuclear stress test 09/13/2011: Impression: Normal stress nuclear study, EF 78%.    Past Medical History:  Diagnosis Date   Allergic rhinitis    Allergy    Anemia in chronic kidney disease (CKD)    Arthritis    B12 deficiency 09/13/2021   Blood transfusion 2012   and 2013   Blood transfusion without reported diagnosis    Cataract    bilateral sx   Chronic low back pain    with radiculopathy   CKD (chronic kidney disease) stage 3, GFR 30-59 ml/min (HCC) 11/29/2013   HLD (hyperlipidemia)    HTN (hypertension)    Hx of total hip arthroplasty 12/15/2011   Open-angle glaucoma    PAD (peripheral artery disease)    with intermittent claudication, ABI 0.72 on Rt and 0.73 on L when assessed in 2010.     Past Surgical History:  Procedure Laterality Date   ABDOMINAL AORTOGRAM W/LOWER EXTREMITY N/A 07/09/2024   Procedure: ABDOMINAL AORTOGRAM W/LOWER EXTREMITY;  Surgeon: Taylor Gaile ORN, MD;  [facility];  Service: Cardiovascular;  Laterality: N/A;   abnormal vascular studies  05/07/2009   ABI's 70, R SFA > 50%^ blockage    APPLICATION OF WOUND VAC Left 07/12/2024   Procedure: APPLICATION, WOUND VAC;  Surgeon: Taylor Jerona GAILS, MD;  [facility];  Service: Orthopedics;  Laterality: Left;   CATARACT EXTRACTION, BILATERAL  2011   EXCISION OF TONGUE LESION WITH LASER N/A 08/31/2015   Procedure: BIOPSY AND CO2 LASER OF TONGUE LESION ;  Surgeon: Ida Loader, MD;  Location: [facility];  Service: ENT;  Laterality: N/A;   EXCISION OF TONGUE LESION WITH LASER Left 06/29/2020    Procedure: EXCISION OF TONGUE LESION/CO2 laser w/biopsy;  Surgeon: Loader Ida, MD;  Location: [facility];  Service: ENT;  Laterality: Left;   EXCISION OF TONGUE LESION WITH LASER Bilateral 10/02/2023   Procedure: ORAL BIOPSY AND CO2 LASER ABLATATION OF TONGUE LESION;  Surgeon: Loader Ida, MD;  [facility];  Service: ENT;  Laterality: Bilateral;   EXCISION ORAL LESION WITH CO2 LASER N/A 12/08/2021   Procedure: EXCISION OF ORAL ORAL CAVITY LESION WITH CO2 LASER;  Surgeon: Loader Ida, MD;  Location: [facility];  Service: ENT;  Laterality: N/A;   EYE SURGERY     GLOSSECTOMY, PARTIAL Left 06/17/2024   Procedure: GLOSSECTOMY, PARTIAL;  Surgeon: Loader Ida, MD;  Location[facility] OR;  Service: ENT;  Laterality: Left;   INCISION AND DRAINAGE OF WOUND Left 07/12/2024   Procedure: IRRIGATION AND DEBRIDEMENT WOUND;  Surgeon: Taylor Jerona GAILS, MD;  [facility];  Service: Orthopedics;  Laterality: Left;  LEFT FOOT DEBRIDEMENT   JOINT REPLACEMENT     LOWER EXTREMITY ANGIOGRAPHY  07/09/2024   Procedure: Lower Extremity Angiography;  Surgeon: Taylor Gaile ORN, MD;  [facility];  Service: Cardiovascular;;   LOWER EXTREMITY INTERVENTION  07/09/2024   Procedure: LOWER EXTREMITY INTERVENTION;  Surgeon: Taylor Gaile ORN, MD;  [facility];  Service: Cardiovascular;;   OTHER SURGICAL HISTORY     ganglion cyst removed left wrist    PERIPHERAL INTRAVASCULAR LITHOTRIPSY  07/09/2024   Procedure: PERIPHERAL INTRAVASCULAR LITHOTRIPSY;  Surgeon: Taylor Gaile ORN, MD;  [facility];  Service: Cardiovascular;;   PERIPHERAL VASCULAR ATHERECTOMY  07/09/2024   Procedure: PERIPHERAL VASCULAR ATHERECTOMY;  Surgeon: Taylor Gaile ORN, MD;  [facility];  Service: Cardiovascular;;   TOTAL HIP ARTHROPLASTY  10/03/2011   Procedure: TOTAL HIP ARTHROPLASTY;  Surgeon: Dempsey GAILS Aluisio;  Location: [facility];  Service: Orthopedics;  Laterality: Right;    TOTAL HIP ARTHROPLASTY  12/21/2011   Procedure: TOTAL HIP ARTHROPLASTY;  Surgeon: Dempsey GAILS Moan, MD;  Location: [facility];  Service: Orthopedics;  Laterality: Left;   TUBAL LIGATION      MEDICATIONS: No current facility-administered medications for this encounter.    amLODipine  (NORVASC ) 10 MG tablet   aspirin  81 MG tablet   atorvastatin  (LIPITOR ) 80 MG tablet   clopidogrel  (PLAVIX ) 75 MG tablet   Faricimab -svoa (VABYSMO  IZ)   lisinopril  (ZESTRIL ) 20 MG tablet   metoprolol  (TOPROL -XL) 200 MG 24 hr tablet   nitroGLYCERIN  (NITRODUR - DOSED IN MG/24 HR) 0.2 mg/hr patch   mupirocin  ointment (BACTROBAN ) 2 %   ondansetron  (ZOFRAN -ODT) 4 MG disintegrating tablet   senna-docusate (SENOKOT-S) 8.6-50 MG tablet    Isaiah Ruder, PA-C Surgical Short Stay/Anesthesiology [facility] Phone [phone] [facility] Phone [phone] 08/13/2024 2:58 PM

## 2024-08-14 ENCOUNTER — Encounter (HOSPITAL_COMMUNITY): Admission: RE | Payer: Self-pay | Source: Home / Self Care

## 2024-08-14 ENCOUNTER — Encounter (HOSPITAL_COMMUNITY): Payer: Self-pay | Admitting: Anesthesiology

## 2024-08-14 ENCOUNTER — Inpatient Hospital Stay (HOSPITAL_COMMUNITY): Admission: RE | Admit: 2024-08-14 | Source: Home / Self Care | Admitting: Otolaryngology

## 2024-08-14 ENCOUNTER — Telehealth: Payer: Self-pay | Admitting: Family Medicine

## 2024-08-14 SURGERY — DISSECTION, NECK, RADICAL
Anesthesia: General | Laterality: Left

## 2024-08-14 NOTE — Telephone Encounter (Unsigned)
 Copied from CRM [id number]. Topic: Clinical - Medication Refill >> Aug 14, 2024  1:48 PM [place] J wrote: Medication: traMADol  (ULTRAM ) 50 MG tablet  Has the patient contacted their pharmacy? Yes (Agent: If no, request that the patient contact the pharmacy for the refill. If patient does not wish to contact the pharmacy document the reason why and proceed with request.) (Agent: If yes, when and what did the pharmacy advise?) No available refills at the pharmacy.  This is the patient's preferred pharmacy:  [facility] [phone] [facility] [place] - [address] [place] [address] Phone: [phone] Fax: [phone]  Is this the correct pharmacy for this prescription? Yes If no, delete pharmacy and type the correct one.   Has the prescription been filled recently? No  Is the patient out of the medication? Yes  Has the patient been seen for an appointment in the last year OR does the patient have an upcoming appointment? Yes  Can we respond through MyChart? Yes  Agent: Please be advised that Rx refills may take up to 3 business days. We ask that you follow-up with your pharmacy.

## 2024-08-14 NOTE — Telephone Encounter (Signed)
 All paperwork faxed to Dr. Clem surg) for surgical medical clearance

## 2024-08-14 NOTE — Telephone Encounter (Addendum)
 All paperwork faxed to Dr. Clem surg) for surgical medical clearance         ----- Message from Avelina FALCON sent at 08/14/2024  9:55 AM EDT ----- Regarding: Surgery Case  Good morning, Dr. Jesus  Please note that Ms. Fukuda's neck dissection, originally scheduled for today, was postponed to 08/21/2024 because she had not held her Plavix  and Aspirin . She is now holding both medications in preparation for the rescheduled date.  The decision was recommended with pre-operative anesthesia and is contingent upon medical clearance from Vascular specialist Dr. Gaile New, who prescribed the medications for a blood clot and leg stent following a dog bite on 07/06/2024. Rosaline is coordinating with Dr. Russ office to confirm the safety of holding these medications pre-operatively.     Thank you,  Avelina

## 2024-08-16 NOTE — Progress Notes (Signed)
 Good morning, Dr. Jesus   Please note that Taylor Bush's neck dissection, originally scheduled for today, was postponed to  08/21/2024 because she had not held her Plavix  and Aspirin . She is now holding both medications in preparation for the rescheduled date.   The decision was recommended with pre-operative anesthesia and is contingent upon medical clearance from Vascular specialist Dr. Gaile New, who prescribed the medications for a blood clot and leg stent following a dog bite on 07/06/2024. Taylor Bush is coordinating with Dr. Russ office to confirm the safety of holding these medications pre-operatively.      Thank you,   Avelina

## 2024-08-19 ENCOUNTER — Ambulatory Visit

## 2024-08-19 ENCOUNTER — Ambulatory Visit (HOSPITAL_COMMUNITY)

## 2024-08-19 ENCOUNTER — Ambulatory Visit: Admitting: Orthopedic Surgery

## 2024-08-20 ENCOUNTER — Encounter (HOSPITAL_COMMUNITY): Payer: Self-pay | Admitting: Otolaryngology

## 2024-08-20 ENCOUNTER — Other Ambulatory Visit: Payer: Self-pay

## 2024-08-20 NOTE — Progress Notes (Signed)
 Patient was called to be informed that the surgery time for tomorrow was changed to 11:45 o'clock. Patient was instructed to be at the hospital at 09:15 o'clock. Patient verbalized understanding.

## 2024-08-20 NOTE — Progress Notes (Signed)
 SDW call  Patient was given pre-op instructions over the phone. Patient verbalized understanding of instructions provided.     PCP - Dr. Jacques Copland Cardiologist - denies Pulmonary:    PPM/ICD - denies Device Orders - na Rep Notified - na   Chest x-ray - [place] EKG -  06/17/2024 Stress Test - ECHO -  Cardiac Cath -   Sleep Study/sleep apnea/CPAP: denies  Non-diabetic  Blood Thinner Instructions: Plavix , last dose 08/14/2024 Aspirin  Instructions:ASA, last dose 08/14/2024   ERAS Protcol - NPO   Anesthesia review: Yes. HTN, PVD, CKD, PAD   Patient denies shortness of breath, fever, cough and chest pain over the phone call  Your procedure is scheduled on Wednesday August 21, 2024  Report to [facility] Main Entrance A at   0800 A.M., then check in with the Admitting office.  Call this number if you have problems the morning of surgery:  [phone]   If you have any questions prior to your surgery date call [phone]: Open Monday-Friday 8am-4pm If you experience any cold or flu symptoms such as cough, fever, chills, shortness of breath, etc. between now and your scheduled surgery, please notify us  at the above number    Remember:  Do not eat or drink after midnight the night before your surgery  Take these medicines the morning of surgery with A SIP OF WATER:  Amlodipine , lipitor , metoprolol   As of today, STOP taking any Aleve, Naproxen, Ibuprofen , Motrin , Advil , Goody's, BC's, all herbal medications, fish oil, and all vitamins.

## 2024-08-20 NOTE — Progress Notes (Signed)
 Anesthesia APP Follow-up:  See my previous progress note from Date of Service 08/13/2024. Surgery postponed until 08/21/2024 because she had not received perioperative ASA and Plavix  instructions. I reached out to Dr. Godfrey office because I did not think he was aware that she had undergone recent LLE stents. Dr. Godfrey office subsequently sent a request for vascular clearance to hold ASA and Plavix  for neck dissection. Dr. Serene signed the clearance form (scanned under Media tab, AMB Correspondence 08/20/2024). Per ENT documentation, as of 08/14/2024 she was holding ASA and Plavix .   Isaiah Ruder, PA-C Surgical Short Stay/Anesthesiology [facility] Phone [phone] [facility] Phone [phone] 08/20/2024 9:50 AM

## 2024-08-20 NOTE — Anesthesia Preprocedure Evaluation (Signed)
 Anesthesia Evaluation  Patient identified by MRN, date of birth, ID band Patient awake    Reviewed: Allergy & Precautions, NPO status , Patient's Chart, lab work & pertinent test results  Airway Mallampati: III  TM Distance: >3 FB Neck ROM: Full    Dental  (+) Teeth Intact, Dental Advisory Given   Pulmonary former smoker   breath sounds clear to auscultation       Cardiovascular hypertension, Pt. on medications and Pt. on home beta blockers + Peripheral Vascular Disease   Rhythm:Regular Rate:Normal     Neuro/Psych  Neuromuscular disease  negative psych ROS   GI/Hepatic negative GI ROS, Neg liver ROS,,,  Endo/Other  negative endocrine ROS    Renal/GU CRFRenal disease     Musculoskeletal  (+) Arthritis ,    Abdominal   Peds  Hematology  (+) Blood dyscrasia, anemia   Anesthesia Other Findings   Reproductive/Obstetrics                              Anesthesia Physical Anesthesia Plan  ASA: 3  Anesthesia Plan: General   Post-op Pain Management: Ofirmev  IV (intra-op)*   Induction: Intravenous  PONV Risk Score and Plan: 4 or greater and Ondansetron , Dexamethasone  and Treatment may vary due to age or medical condition  Airway Management Planned: Oral ETT  Additional Equipment: None  Intra-op Plan:   Post-operative Plan: Extubation in OR  Informed Consent: I have reviewed the patients History and Physical, chart, labs and discussed the procedure including the risks, benefits and alternatives for the proposed anesthesia with the patient or authorized representative who has indicated his/her understanding and acceptance.     Dental advisory given  Plan Discussed with: CRNA  Anesthesia Plan Comments: (  )         Anesthesia Quick Evaluation

## 2024-08-21 ENCOUNTER — Inpatient Hospital Stay (HOSPITAL_COMMUNITY): Admitting: Vascular Surgery

## 2024-08-21 ENCOUNTER — Inpatient Hospital Stay (HOSPITAL_COMMUNITY)

## 2024-08-21 ENCOUNTER — Encounter (HOSPITAL_COMMUNITY)
Admission: RE | Disposition: A | Discharge status: Home / Self Care | Payer: Self-pay | Source: Home / Self Care | Attending: Otolaryngology

## 2024-08-21 ENCOUNTER — Encounter (HOSPITAL_COMMUNITY): Payer: Self-pay | Admitting: Otolaryngology

## 2024-08-21 ENCOUNTER — Other Ambulatory Visit: Payer: Self-pay

## 2024-08-21 ENCOUNTER — Inpatient Hospital Stay (HOSPITAL_COMMUNITY)
Admission: RE | Admit: 2024-08-21 | Discharge: 2024-08-22 | DRG: 822 | Disposition: A | Discharge status: Home / Self Care | Attending: Otolaryngology | Admitting: Otolaryngology

## 2024-08-21 DIAGNOSIS — G8929 Other chronic pain: Secondary | ICD-10-CM | POA: Diagnosis present

## 2024-08-21 DIAGNOSIS — Z7982 Long term (current) use of aspirin: Secondary | ICD-10-CM

## 2024-08-21 DIAGNOSIS — I480 Paroxysmal atrial fibrillation: Secondary | ICD-10-CM | POA: Diagnosis not present

## 2024-08-21 DIAGNOSIS — N1832 Chronic kidney disease, stage 3b: Secondary | ICD-10-CM | POA: Diagnosis present

## 2024-08-21 DIAGNOSIS — M545 Low back pain, unspecified: Secondary | ICD-10-CM | POA: Diagnosis present

## 2024-08-21 DIAGNOSIS — H4010X0 Unspecified open-angle glaucoma, stage unspecified: Secondary | ICD-10-CM | POA: Diagnosis present

## 2024-08-21 DIAGNOSIS — I129 Hypertensive chronic kidney disease with stage 1 through stage 4 chronic kidney disease, or unspecified chronic kidney disease: Secondary | ICD-10-CM

## 2024-08-21 DIAGNOSIS — Z23 Encounter for immunization: Secondary | ICD-10-CM | POA: Diagnosis present

## 2024-08-21 DIAGNOSIS — K1321 Leukoplakia of oral mucosa, including tongue: Secondary | ICD-10-CM | POA: Diagnosis present

## 2024-08-21 DIAGNOSIS — Z9842 Cataract extraction status, left eye: Secondary | ICD-10-CM

## 2024-08-21 DIAGNOSIS — I70222 Atherosclerosis of native arteries of extremities with rest pain, left leg: Secondary | ICD-10-CM | POA: Diagnosis present

## 2024-08-21 DIAGNOSIS — E785 Hyperlipidemia, unspecified: Secondary | ICD-10-CM | POA: Diagnosis present

## 2024-08-21 DIAGNOSIS — E1151 Type 2 diabetes mellitus with diabetic peripheral angiopathy without gangrene: Secondary | ICD-10-CM | POA: Diagnosis present

## 2024-08-21 DIAGNOSIS — Z87891 Personal history of nicotine dependence: Secondary | ICD-10-CM

## 2024-08-21 DIAGNOSIS — Z9841 Cataract extraction status, right eye: Secondary | ICD-10-CM | POA: Diagnosis not present

## 2024-08-21 DIAGNOSIS — I4891 Unspecified atrial fibrillation: Secondary | ICD-10-CM | POA: Diagnosis not present

## 2024-08-21 DIAGNOSIS — C029 Malignant neoplasm of tongue, unspecified: Secondary | ICD-10-CM | POA: Diagnosis present

## 2024-08-21 DIAGNOSIS — N183 Chronic kidney disease, stage 3 unspecified: Secondary | ICD-10-CM

## 2024-08-21 DIAGNOSIS — C770 Secondary and unspecified malignant neoplasm of lymph nodes of head, face and neck: Principal | ICD-10-CM | POA: Diagnosis present

## 2024-08-21 DIAGNOSIS — Z8249 Family history of ischemic heart disease and other diseases of the circulatory system: Secondary | ICD-10-CM

## 2024-08-21 DIAGNOSIS — E1122 Type 2 diabetes mellitus with diabetic chronic kidney disease: Secondary | ICD-10-CM | POA: Diagnosis present

## 2024-08-21 DIAGNOSIS — Z888 Allergy status to other drugs, medicaments and biological substances status: Secondary | ICD-10-CM

## 2024-08-21 DIAGNOSIS — D631 Anemia in chronic kidney disease: Secondary | ICD-10-CM | POA: Diagnosis present

## 2024-08-21 DIAGNOSIS — M1990 Unspecified osteoarthritis, unspecified site: Secondary | ICD-10-CM | POA: Diagnosis present

## 2024-08-21 DIAGNOSIS — Z83719 Family history of colon polyps, unspecified: Secondary | ICD-10-CM

## 2024-08-21 DIAGNOSIS — Z96643 Presence of artificial hip joint, bilateral: Secondary | ICD-10-CM | POA: Diagnosis present

## 2024-08-21 DIAGNOSIS — Z7902 Long term (current) use of antithrombotics/antiplatelets: Secondary | ICD-10-CM | POA: Diagnosis not present

## 2024-08-21 HISTORY — PX: RADICAL NECK DISSECTION: SHX2284

## 2024-08-21 LAB — BASIC METABOLIC PANEL WITH GFR
Anion gap: 12 (ref 5–15)
BUN: 14 mg/dL (ref 8–23)
CO2: 21 mmol/L — ABNORMAL LOW (ref 22–32)
Calcium: 8.2 mg/dL — ABNORMAL LOW (ref 8.9–10.3)
Chloride: 105 mmol/L (ref 98–111)
Creatinine, Ser: 1.58 mg/dL — ABNORMAL HIGH (ref 0.44–1.00)
GFR, Estimated: 33 mL/min — ABNORMAL LOW (ref >60)
Glucose, Bld: 92 mg/dL (ref 70–99)
Potassium: 4.2 mmol/L (ref 3.5–5.1)
Sodium: 138 mmol/L (ref 135–145)

## 2024-08-21 LAB — CBC
HCT: 32.2 % — ABNORMAL LOW (ref 36.0–46.0)
Hemoglobin: 10.2 g/dL — ABNORMAL LOW (ref 12.0–15.0)
MCH: 30.5 pg (ref 26.0–34.0)
MCHC: 31.7 g/dL (ref 30.0–36.0)
MCV: 96.4 fL (ref 80.0–100.0)
Platelets: 229 K/uL (ref 150–400)
RBC: 3.34 MIL/uL — ABNORMAL LOW (ref 3.87–5.11)
RDW: 14.8 % (ref 11.5–15.5)
WBC: 9.6 K/uL (ref 4.0–10.5)
nRBC: 0 % (ref 0.0–0.2)

## 2024-08-21 LAB — TSH: TSH: 1.187 u[IU]/mL (ref 0.350–4.500)

## 2024-08-21 LAB — MAGNESIUM: Magnesium: 1.3 mg/dL — ABNORMAL LOW (ref 1.7–2.4)

## 2024-08-21 SURGERY — DISSECTION, NECK, RADICAL
Anesthesia: General | Laterality: Left

## 2024-08-21 MED ORDER — CHLORHEXIDINE GLUCONATE 0.12 % MT SOLN
15.0000 mL | Freq: Once | OROMUCOSAL | Status: AC
  Administered 2024-08-21: 15 mL via OROMUCOSAL
  Filled 2024-08-21: qty 15

## 2024-08-21 MED ORDER — LIDOCAINE 2% (20 MG/ML) 5 ML SYRINGE
INTRAMUSCULAR | Status: AC
  Filled 2024-08-21: qty 5

## 2024-08-21 MED ORDER — LACTATED RINGERS IV SOLN: INTRAVENOUS | Status: DC | PRN

## 2024-08-21 MED ORDER — AMISULPRIDE (ANTIEMETIC) 5 MG/2ML IV SOLN: 10.0000 mg | Freq: Once | INTRAVENOUS | Status: DC | PRN

## 2024-08-21 MED ORDER — ONDANSETRON 4 MG PO TBDP: 4.0000 mg | ORAL_TABLET | Freq: Three times a day (TID) | ORAL | Status: DC | PRN

## 2024-08-21 MED ORDER — PHENYLEPHRINE HCL-NACL 20-0.9 MG/250ML-% IV SOLN
INTRAVENOUS | Status: DC | PRN
  Administered 2024-08-21: 40 ug/min via INTRAVENOUS

## 2024-08-21 MED ORDER — ROCURONIUM BROMIDE 10 MG/ML (PF) SYRINGE
PREFILLED_SYRINGE | INTRAVENOUS | Status: DC | PRN
  Administered 2024-08-21: 50 mg via INTRAVENOUS
  Administered 2024-08-21: 30 mg via INTRAVENOUS

## 2024-08-21 MED ORDER — ONDANSETRON HCL 4 MG/2ML IJ SOLN
INTRAMUSCULAR | Status: DC | PRN
  Administered 2024-08-21: 4 mg via INTRAVENOUS

## 2024-08-21 MED ORDER — ASPIRIN 81 MG PO TBEC
81.0000 mg | DELAYED_RELEASE_TABLET | Freq: Every day | ORAL | Status: DC
  Administered 2024-08-21 – 2024-08-22 (×2): 81 mg via ORAL
  Filled 2024-08-21 (×2): qty 1

## 2024-08-21 MED ORDER — AMLODIPINE BESYLATE 10 MG PO TABS
10.0000 mg | ORAL_TABLET | Freq: Every day | ORAL | Status: DC
  Administered 2024-08-22: 10 mg via ORAL
  Filled 2024-08-21: qty 1

## 2024-08-21 MED ORDER — PROPOFOL 10 MG/ML IV BOLUS
INTRAVENOUS | Status: AC
  Filled 2024-08-21: qty 20

## 2024-08-21 MED ORDER — IBUPROFEN 100 MG/5ML PO SUSP: 400.0000 mg | Freq: Four times a day (QID) | ORAL | Status: DC | PRN

## 2024-08-21 MED ORDER — FENTANYL CITRATE (PF) 250 MCG/5ML IJ SOLN
INTRAMUSCULAR | Status: AC
  Filled 2024-08-21: qty 5

## 2024-08-21 MED ORDER — FENTANYL CITRATE (PF) 100 MCG/2ML IJ SOLN
INTRAMUSCULAR | Status: AC
  Filled 2024-08-21: qty 2

## 2024-08-21 MED ORDER — ROCURONIUM BROMIDE 10 MG/ML (PF) SYRINGE
PREFILLED_SYRINGE | INTRAVENOUS | Status: AC
  Filled 2024-08-21: qty 10

## 2024-08-21 MED ORDER — METOPROLOL SUCCINATE ER 50 MG PO TB24
200.0000 mg | ORAL_TABLET | Freq: Every day | ORAL | Status: DC
  Administered 2024-08-22: 200 mg via ORAL
  Filled 2024-08-21: qty 4

## 2024-08-21 MED ORDER — DEXAMETHASONE SOD PHOSPHATE PF 10 MG/ML IJ SOLN
INTRAMUSCULAR | Status: DC | PRN
  Administered 2024-08-21: 5 mg via INTRAVENOUS

## 2024-08-21 MED ORDER — OXYCODONE HCL 5 MG/5ML PO SOLN: 5.0000 mg | Freq: Once | ORAL | Status: DC | PRN

## 2024-08-21 MED ORDER — INFLUENZA VAC SPLIT HIGH-DOSE 0.5 ML IM SUSY
0.5000 mL | PREFILLED_SYRINGE | INTRAMUSCULAR | Status: AC
Start: 2024-08-22 — End: 2024-08-22
  Administered 2024-08-22: 0.5 mL via INTRAMUSCULAR
  Filled 2024-08-21: qty 0.5

## 2024-08-21 MED ORDER — PHENYLEPHRINE 80 MCG/ML (10ML) SYRINGE FOR IV PUSH (FOR BLOOD PRESSURE SUPPORT)
PREFILLED_SYRINGE | INTRAVENOUS | Status: DC | PRN
  Administered 2024-08-21: 80 ug via INTRAVENOUS
  Administered 2024-08-21: 160 ug via INTRAVENOUS

## 2024-08-21 MED ORDER — BACITRACIN ZINC 500 UNIT/GM EX OINT
TOPICAL_OINTMENT | Freq: Three times a day (TID) | CUTANEOUS | Status: DC
  Administered 2024-08-21: 31.5 via TOPICAL
  Filled 2024-08-21: qty 28.4

## 2024-08-21 MED ORDER — LACTATED RINGERS IV SOLN: INTRAVENOUS | Status: DC

## 2024-08-21 MED ORDER — BACITRACIN ZINC 500 UNIT/GM EX OINT
TOPICAL_OINTMENT | CUTANEOUS | Status: DC | PRN
  Administered 2024-08-21: 1 via TOPICAL

## 2024-08-21 MED ORDER — FENTANYL CITRATE (PF) 250 MCG/5ML IJ SOLN
INTRAMUSCULAR | Status: DC | PRN
  Administered 2024-08-21 (×3): 50 ug via INTRAVENOUS

## 2024-08-21 MED ORDER — SUGAMMADEX SODIUM 200 MG/2ML IV SOLN
INTRAVENOUS | Status: DC | PRN
  Administered 2024-08-21: 200 mg via INTRAVENOUS

## 2024-08-21 MED ORDER — ACETAMINOPHEN 10 MG/ML IV SOLN
INTRAVENOUS | Status: AC
  Filled 2024-08-21: qty 100

## 2024-08-21 MED ORDER — OXYCODONE HCL 5 MG PO TABS: 5.0000 mg | ORAL_TABLET | Freq: Once | ORAL | Status: DC | PRN

## 2024-08-21 MED ORDER — ATORVASTATIN CALCIUM 80 MG PO TABS
80.0000 mg | ORAL_TABLET | Freq: Every day | ORAL | Status: DC
  Administered 2024-08-22: 80 mg via ORAL
  Filled 2024-08-21: qty 1

## 2024-08-21 MED ORDER — ONDANSETRON HCL 4 MG/2ML IJ SOLN
INTRAMUSCULAR | Status: AC
  Filled 2024-08-21: qty 2

## 2024-08-21 MED ORDER — 0.9 % SODIUM CHLORIDE (POUR BTL) OPTIME
TOPICAL | Status: DC | PRN
  Administered 2024-08-21: 1000 mL

## 2024-08-21 MED ORDER — LIDOCAINE 2% (20 MG/ML) 5 ML SYRINGE
INTRAMUSCULAR | Status: DC | PRN
  Administered 2024-08-21: 60 mg via INTRAVENOUS

## 2024-08-21 MED ORDER — HYDROCODONE-ACETAMINOPHEN 5-325 MG PO TABS
1.0000 | ORAL_TABLET | ORAL | Status: DC | PRN
  Administered 2024-08-21 – 2024-08-22 (×2): 1 via ORAL
  Filled 2024-08-21 (×2): qty 1

## 2024-08-21 MED ORDER — NITROGLYCERIN 0.2 MG/HR TD PT24
0.2000 mg | MEDICATED_PATCH | Freq: Every day | TRANSDERMAL | Status: DC
  Administered 2024-08-21 – 2024-08-22 (×2): 0.2 mg via TRANSDERMAL
  Filled 2024-08-21 (×2): qty 1

## 2024-08-21 MED ORDER — SENNOSIDES-DOCUSATE SODIUM 8.6-50 MG PO TABS: 1.0000 | ORAL_TABLET | Freq: Every evening | ORAL | Status: DC | PRN

## 2024-08-21 MED ORDER — PROPOFOL 10 MG/ML IV BOLUS
INTRAVENOUS | Status: DC | PRN
  Administered 2024-08-21: 100 mg via INTRAVENOUS

## 2024-08-21 MED ORDER — FENTANYL CITRATE (PF) 100 MCG/2ML IJ SOLN
25.0000 ug | INTRAMUSCULAR | Status: DC | PRN
  Administered 2024-08-21: 50 ug via INTRAVENOUS

## 2024-08-21 MED ORDER — ORAL CARE MOUTH RINSE: 15.0000 mL | Freq: Once | OROMUCOSAL | Status: AC

## 2024-08-21 MED ORDER — LISINOPRIL 20 MG PO TABS
20.0000 mg | ORAL_TABLET | Freq: Every day | ORAL | Status: DC
  Administered 2024-08-21 – 2024-08-22 (×2): 20 mg via ORAL
  Filled 2024-08-21 (×2): qty 1

## 2024-08-21 MED ORDER — BACITRACIN ZINC 500 UNIT/GM EX OINT
TOPICAL_OINTMENT | CUTANEOUS | Status: AC
  Filled 2024-08-21: qty 28.35

## 2024-08-21 MED ORDER — DEXTROSE-SODIUM CHLORIDE 5-0.9 % IV SOLN: INTRAVENOUS | Status: DC

## 2024-08-21 MED ORDER — ACETAMINOPHEN 10 MG/ML IV SOLN
INTRAVENOUS | Status: DC | PRN
  Administered 2024-08-21: 1000 mg via INTRAVENOUS

## 2024-08-21 SURGICAL SUPPLY — 45 items
CANISTER SUCTION 3000ML PPV (SUCTIONS) ×1 IMPLANT
CLEANER TIP ELECTROSURG 2X2 (MISCELLANEOUS) ×1 IMPLANT
CLIP APPLIE 9.375 SM OPEN (CLIP) IMPLANT
CNTNR URN SCR LID CUP LEK RST (MISCELLANEOUS) IMPLANT
CORD BIPOLAR FORCEPS 12FT (ELECTRODE) ×1 IMPLANT
COVER SURGICAL LIGHT HANDLE (MISCELLANEOUS) ×1 IMPLANT
DRAIN JP 10F RND RADIO (DRAIN) IMPLANT
DRAIN JP 15F RND RADIO PRF (DRAIN) IMPLANT
DRAPE HALF SHEET 40X57 (DRAPES) IMPLANT
DRAPE INCISE 23X17 STRL (DRAPES) IMPLANT
ELECT COATED BLADE 2.86 ST (ELECTRODE) ×1 IMPLANT
ELECTRODE REM PT RTRN 9FT ADLT (ELECTROSURGICAL) ×1 IMPLANT
EVACUATOR SILICONE 100CC (DRAIN) ×1 IMPLANT
FORCEPS BIPOLAR SPETZLER 8 1.0 (NEUROSURGERY SUPPLIES) ×1 IMPLANT
GAUZE 4X4 16PLY ~~LOC~~+RFID DBL (SPONGE) ×1 IMPLANT
GLOVE BIO SURGEON STRL SZ 6.5 (GLOVE) ×1 IMPLANT
GLOVE ECLIPSE 7.5 STRL STRAW (GLOVE) ×1 IMPLANT
GOWN STRL REUS W/ TWL LRG LVL3 (GOWN DISPOSABLE) ×2 IMPLANT
KIT BASIN OR (CUSTOM PROCEDURE TRAY) ×1 IMPLANT
KIT TURNOVER KIT B (KITS) ×1 IMPLANT
LOCATOR NERVE 3 VOLT (DISPOSABLE) IMPLANT
NDL PRECISIONGLIDE 27X1.5 (NEEDLE) ×1 IMPLANT
NEEDLE PRECISIONGLIDE 27X1.5 (NEEDLE) ×1 IMPLANT
PAD ARMBOARD POSITIONER FOAM (MISCELLANEOUS) ×2 IMPLANT
PENCIL FOOT CONTROL (ELECTRODE) ×1 IMPLANT
SHEARS HARMONIC 9CM CVD (BLADE) ×1 IMPLANT
SOLN 0.9% NACL 1000 ML (IV SOLUTION) ×1 IMPLANT
SOLN 0.9% NACL POUR BTL 1000ML (IV SOLUTION) ×1 IMPLANT
SPECIMEN JAR MEDIUM (MISCELLANEOUS) IMPLANT
SPONGE INTESTINAL PEANUT (DISPOSABLE) IMPLANT
SPONGE T-LAP 18X18 ~~LOC~~+RFID (SPONGE) IMPLANT
STAPLER SKIN PROX 35W (STAPLE) ×1 IMPLANT
SUT CHROMIC 3 0 SH 27 (SUTURE) ×1 IMPLANT
SUT CHROMIC 5 0 P 3 (SUTURE) IMPLANT
SUT ETHILON 3 0 PS 1 (SUTURE) ×1 IMPLANT
SUT ETHILON 5 0 PS 2 18 (SUTURE) IMPLANT
SUT SILK 2 0 REEL (SUTURE) IMPLANT
SUT SILK 2 0 SH (SUTURE) IMPLANT
SUT SILK 3 0 SH CR/8 (SUTURE) ×1 IMPLANT
SUT SILK 4 0 REEL (SUTURE) ×1 IMPLANT
SUT SILK 4 0 SH CR/8 (SUTURE) IMPLANT
SUT VIC AB 3-0 SH 18 (SUTURE) IMPLANT
TRAY ENT MC OR (CUSTOM PROCEDURE TRAY) ×1 IMPLANT
TRAY FOLEY MTR SLVR 14FR STAT (SET/KITS/TRAYS/PACK) IMPLANT
TUBE 10FR 43IN 5G WT TIP ENFIT (TUBING) IMPLANT

## 2024-08-21 NOTE — Op Note (Signed)
 OPERATIVE REPORT  DATE OF SURGERY: 08/21/2024  PATIENT:  Taylor Bush,  78 y.o. female  PRE-OPERATIVE DIAGNOSIS:  Tongue cancer  POST-OPERATIVE DIAGNOSIS:  Tongue cancer  PROCEDURE:  Procedure(s): DISSECTION, NECK, Modified RADICAL, left  SURGEON:  Ida VEAR Loader, MD  ASSISTANTS: RNFA  ANESTHESIA:   General   EBL:  50 ml  DRAINS: 15 Fr JP  LOCAL MEDICATIONS USED:  None  SPECIMEN:  left modified neck dissection, level 1,2, and 3  COUNTS:  Correct  PROCEDURE DETAILS: The patient was taken to the operating room and placed on the operating table in the supine position. Following induction of general endotracheal anesthesia, The left neck was prepped and draped ina  standard fashion. A left hemiapron incision was outlined with a marking pen. Electrocautery was used to incise the skin and subcutaneous tissue and through the platysma. A sub-platysmal flap was elevated up tot he mandible and down to just above the clavicle.  The marginal mandibular branch of the facial nerve was identified and reflected superiorly.  Soft tissue was dissected off of the anterior mandible and digastric muscle cleaning out the submental triangle and bring the tissue back posteriorly to the mylohyoid muscle.  Dissection then continued down along the ramus of the mandible deep to the gland until the facial vessels were separately identified ligated between clamps and divided.  4 silk ties were used throughout the procedure.  The gland was brought inferiorly and the mylohyoid was reflected anteriorly.  The submandibular ganglion was identified and resected.  The duct and sublingual gland were then similarly transected.  The gland was brought inferiorly.  Lingual artery was identified and ligated between clamps and divided.  Fibrofatty tissue lateral to the sternocleidomastoid muscles were dissected off of the muscle anteriorly.  The great auricular nerve was preserved.  Dissection continued medial to the  sternocleidomastoid muscle the spinal accessory nerve was identified.  Attempt was made to preserve this but there was a large hard node just medial to it that was harshly attached so the nerve was sacrificed.  Lymph node bearing tissue posterior superior to the nerve was then dissected off of the splenius capitis and levator scapula muscles and brought anteroinferiorly.  Soft tissue was then dissected off of the cervical plexus and brought forward until the internal jugular vein was identified.  The same hard node was fixed to the vein so the vein was sacrificed.  The venous stumps were ligated with 4-0 silk ties and 4 oh stick tie silk.  The carotid and vagus were preserved.  Fibrofatty tissue was then dissected inferiorly down to the level of the omohyoid muscle.  The specimen was delivered oriented on the towel for pathology.  The wound was irrigated with saline.  Hemostasis was completed.  The drain was exited through a separate stab incision and secured in place with a silk suture.  The platysma layer was re-approximated with interrupted 3-0 chromic suture and the skin was re-approximated with skin staples. The drain was charged. The patient was awakened, extubated and transferred to PACU in stable condition.   PATIENT DISPOSITION:  To PACU, stable

## 2024-08-21 NOTE — Interval H&P Note (Signed)
 History and Physical Interval Note:  08/21/2024 9:38 AM  Taylor Bush  has presented today for surgery, with the diagnosis of Tongue cancer.  The various methods of treatment have been discussed with the patient and family. After consideration of risks, benefits and other options for treatment, the patient has consented to  Procedure(s): DISSECTION, NECK, RADICAL (Left) as a surgical intervention.  The patient's history has been reviewed, patient examined, no change in status, stable for surgery.  I have reviewed the patient's chart and labs.  Questions were answered to the patient's satisfaction.     Ida Loader

## 2024-08-21 NOTE — Consult Note (Signed)
 Cardiology Consultation   Bush ID: Taylor Bush MRN: [id number]; DOB: [birth year]  Admit date: 08/21/2024 Date of Consult: 08/21/2024  PCP:  Watt Mirza, MD   [facility] Providers Cardiologist:  Peter Swaziland, MD        Bush Profile: Taylor Bush is a 78 y.o. female with a hx of tongue cancer, hypertension, hyperlipidemia, stage IIIb CKD, and PAD who is being seen 08/21/2024 for Taylor evaluation of atrial fibrillation at Taylor request of Reyes Loader, MD.  History of Present Illness: Taylor Bush is a 78 year old female who per chart review has not been seen by cardiology.  She has been followed by vascular surgery for her PAD. On 07/2024 Taylor Bush underwent a lower extremity angiogram for left SFA revascularization because of her critical limb ischemia and nonhealing foot wound.  Taylor femoral and popliteal artery were treated with laser arthrectomy, balloon angioplasty, and stents.  Medical management included aspirin , Plavix  and a statin.  She has an invasive carcinoma that was confirmed by biopsy.  Has previously had a partial glossectomy.  Taylor Bush was admitted by ENT for a left modified neck dissection.  Taylor modified neck dissection was done today and Taylor Bush developed atrial fibrillation.  On interview Bush denied any symptoms with atrial fibrillation such as shortness of breath, chest pain, or palpitations.  Denies any prior history of atrial fibrillation.  Denies any prior history of diabetes, heart attack, stroke or heart failure.  Denies any fever, chills, nausea, vomiting, diaphoresis.  Denies any symptoms of sleep apnea such as snoring, poor sleep quality, daytime somnolence.  Denies any nicotine use, alcohol  use, illicit substance use.  Labs showed potassium of 4.2, elevated creatinine of 1.58, calcium  of 8.2, and anemia with a hemoglobin of 10.2.     Past Medical History:  Diagnosis Date   Allergic rhinitis    Allergy    Anemia in chronic  kidney disease (CKD)    Arthritis    B12 deficiency 09/13/2021   Blood transfusion 2012   and 2013   Blood transfusion without reported diagnosis    Cataract    bilateral sx   Chronic low back pain    with radiculopathy   CKD (chronic kidney disease) stage 3, GFR 30-59 ml/min (HCC) 11/29/2013   HLD (hyperlipidemia)    HTN (hypertension)    Hx of total hip arthroplasty 12/15/2011   Open-angle glaucoma    PAD (peripheral artery disease)    with intermittent claudication, ABI 0.72 on Rt and 0.73 on L when assessed in 2010.     Past Surgical History:  Procedure Laterality Date   ABDOMINAL AORTOGRAM W/LOWER EXTREMITY N/A 07/09/2024   Procedure: ABDOMINAL AORTOGRAM W/LOWER EXTREMITY;  Surgeon: Serene Gaile ORN, MD;  [facility];  Service: Cardiovascular;  Laterality: N/A;   abnormal vascular studies  05/07/2009   ABI's 70, R SFA > 50%^ blockage    APPLICATION OF WOUND VAC Left 07/12/2024   Procedure: APPLICATION, WOUND VAC;  Surgeon: Harden Jerona GAILS, MD;  Location: [facility];  Service: Orthopedics;  Laterality: Left;   CATARACT EXTRACTION, BILATERAL  2011   EXCISION OF TONGUE LESION WITH LASER N/A 08/31/2015   Procedure: BIOPSY AND CO2 LASER OF TONGUE LESION ;  Surgeon: Ida Loader, MD;  Location: [facility];  Service: ENT;  Laterality: N/A;   EXCISION OF TONGUE LESION WITH LASER Left 06/29/2020   Procedure: EXCISION OF TONGUE LESION/CO2 laser w/biopsy;  Surgeon: Loader Ida, MD;  Location: [facility];  Service: ENT;  Laterality: Left;   EXCISION OF TONGUE LESION WITH LASER Bilateral 10/02/2023   Procedure: ORAL BIOPSY AND CO2 LASER ABLATATION OF TONGUE LESION;  Surgeon: Jesus Oliphant, MD;  Location: [facility];  Service: ENT;  Laterality: Bilateral;   EXCISION ORAL LESION WITH CO2 LASER N/A 12/08/2021   Procedure: EXCISION OF ORAL ORAL CAVITY LESION WITH CO2 LASER;  Surgeon: Jesus Oliphant, MD;  Location: [facility];  Service: ENT;   Laterality: N/A;   EYE SURGERY     GLOSSECTOMY, PARTIAL Left 06/17/2024   Procedure: GLOSSECTOMY, PARTIAL;  Surgeon: Jesus Oliphant, MD;  Location: [facility];  Service: ENT;  Laterality: Left;   INCISION AND DRAINAGE OF WOUND Left 07/12/2024   Procedure: IRRIGATION AND DEBRIDEMENT WOUND;  Surgeon: Harden Jerona GAILS, MD;  Location: [facility] OR;  Service: Orthopedics;  Laterality: Left;  LEFT FOOT DEBRIDEMENT   JOINT REPLACEMENT     LOWER EXTREMITY ANGIOGRAPHY  07/09/2024   Procedure: Lower Extremity Angiography;  Surgeon: Serene Gaile ORN, MD;  [facility]: [facility];  Service: Cardiovascular;;   LOWER EXTREMITY INTERVENTION  07/09/2024   Procedure: LOWER EXTREMITY INTERVENTION;  Surgeon: Serene Gaile ORN, MD;  [facility];  Service: Cardiovascular;;   OTHER SURGICAL HISTORY     ganglion cyst removed left wrist    PERIPHERAL INTRAVASCULAR LITHOTRIPSY  07/09/2024   Procedure: PERIPHERAL INTRAVASCULAR LITHOTRIPSY;  Surgeon: Serene Gaile ORN, MD;  [facility]: [facility];  Service: Cardiovascular;;   PERIPHERAL VASCULAR ATHERECTOMY  07/09/2024   Procedure: PERIPHERAL VASCULAR ATHERECTOMY;  Surgeon: Serene Gaile ORN, MD;  [facility]: [facility];  Service: Cardiovascular;;   TOTAL HIP ARTHROPLASTY  10/03/2011   Procedure: TOTAL HIP ARTHROPLASTY;  Surgeon: Dempsey GAILS Aluisio;  Location: [facility];  Service: Orthopedics;  Laterality: Right;   TOTAL HIP ARTHROPLASTY  12/21/2011   Procedure: TOTAL HIP ARTHROPLASTY;  Surgeon: Dempsey GAILS Moan, MD;  Location: [facility];  Service: Orthopedics;  Laterality: Left;   TUBAL LIGATION       Home Medications:  Prior to Admission medications   Medication Sig Start Date End Date Taking? Authorizing Provider  amLODipine  (NORVASC ) 10 MG tablet TAKE 1 TABLET BY MOUTH DAILY 08/05/24  Yes Copland, Jacques, MD  atorvastatin  (LIPITOR ) 80 MG tablet Take 1 tablet (80 mg total) by mouth daily. 07/14/24  Yes Akula, Vijaya, MD  lisinopril  (ZESTRIL ) 20 MG tablet TAKE 1 TABLET  BY MOUTH DAILY 08/05/24  Yes Copland, Jacques, MD  metoprolol  (TOPROL -XL) 200 MG 24 hr tablet TAKE 1 TABLET BY MOUTH DAILY 08/05/24  Yes Copland, Jacques, MD  nitroGLYCERIN  (NITRODUR - DOSED IN MG/24 HR) 0.2 mg/hr patch Place 1 patch (0.2 mg total) onto Taylor skin daily. 07/23/24  Yes Gerome Maurilio HERO, PA-C  aspirin  81 MG tablet Take 81 mg by mouth daily.    [provider]  clopidogrel  (PLAVIX ) 75 MG tablet Take 1 tablet (75 mg total) by mouth daily with breakfast. 07/14/24   Cherlyn Labella, MD  Faricimab -svoa (VABYSMO  IZ) 1 Dose by Intravitreal route every 6 (six) weeks.    [provider]  mupirocin  ointment (BACTROBAN ) 2 % Place 1 Application into Taylor nose 2 (two) times daily. Bush not taking: Reported on 08/12/2024 07/13/24   Akula, Vijaya, MD  ondansetron  (ZOFRAN -ODT) 4 MG disintegrating tablet Take 1 tablet (4 mg total) by mouth every 8 (eight) hours as needed for nausea or vomiting. Bush not taking: Reported on 08/12/2024 06/18/24   Jesus,  Jefry, MD  senna-docusate (SENOKOT-S) 8.6-50 MG tablet Take 1 tablet by mouth at bedtime as needed for mild constipation. Bush not taking: Reported on 08/12/2024 07/13/24   Cherlyn Labella, MD    Scheduled Meds:  Continuous Infusions:  lactated ringers      PRN Meds: amisulpride, fentaNYL  (SUBLIMAZE ) injection, oxyCODONE  **OR** oxyCODONE   Allergies:    Allergies  Allergen Reactions   Prednisone Other (See Comments)    Joint pain,joint stiffness   Statins Rash    hands broke out    Social History:   Social History   Socioeconomic History   Marital status: Widowed    Spouse name: Not on file   Number of children: Not on file   Years of education: Not on file   Highest education level: Not on file  Occupational History   Not on file  Tobacco Use   Smoking status: Former    Current packs/day: 0.00    Types: Cigarettes    Quit date: 11/07/2008    Years since quitting: 15.7   Smokeless tobacco: Never  Vaping Use   Vaping  status: Never Used  Substance and Sexual Activity   Alcohol  use: No   Drug use: No   Sexual activity: Not on file  Other Topics Concern   Not on file  Social History Narrative   Retired Haematologist, 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    Social Drivers of Corporate investment banker Strain: Low Risk  (04/29/2024)   Overall Financial Resource Strain (CARDIA)    Difficulty of Paying Living Expenses: Not hard at all  Food Insecurity: No Food Insecurity (07/06/2024)   Hunger Vital Sign    Worried About Running Out of Food in Taylor Last Year: Never true    Ran Out of Food in Taylor Last Year: Never true  Transportation Needs: No Transportation Needs (07/06/2024)   PRAPARE - Administrator, Civil Service (Medical): No    Lack of Transportation (Non-Medical): No  Physical Activity: Insufficiently Active (04/29/2024)   Exercise Vital Sign    Days of Exercise per Week: 7 days    Minutes of Exercise per Session: 20 min  Stress: No Stress Concern Present (04/29/2024)   [employer] of Occupational Health - Occupational Stress Questionnaire    Feeling of Stress: Not at all  Social Connections: Moderately Isolated (07/06/2024)   Social Connection and Isolation Panel    Frequency of Communication with Friends and Family: More than three times a week    Frequency of Social Gatherings with Friends and Family: More than three times a week    Attends Religious Services: More than 4 times per year    Active Member of [employer] or Organizations: No    Attends Banker Meetings: Never    Marital Status: Widowed  Intimate Partner Violence: Not At Risk (07/06/2024)   Humiliation, Afraid, Rape, and Kick questionnaire    Fear of Current or Ex-Partner: No    Emotionally Abused: No    Physically Abused: No    Sexually Abused: No    Family History:    Family History  Problem Relation Age of Onset   Colon polyps Mother 50   Heart attack Father        80s   Stroke Neg Hx        also no  PAD or aneurysm   Colon cancer Neg Hx    Esophageal cancer Neg Hx    Stomach cancer Neg Hx  Rectal cancer Neg Hx      ROS:  Please see Taylor history of present illness.   All other ROS reviewed and negative.     Physical Exam/Data: Vitals:   08/21/24 1500 08/21/24 1515 08/21/24 1530 08/21/24 1545  BP: 129/66 136/74 124/60 126/64  Pulse: 76 77 75 69  Resp: 15 20 18 17   Temp:      TempSrc:      SpO2: 94% 97% 97% 97%  Weight:      Height:        Intake/Output Summary (Last 24 hours) at 08/21/2024 1612 Last data filed at 08/21/2024 1354 Gross per 24 hour  Intake 850 ml  Output 10 ml  Net 840 ml      08/21/2024    9:23 AM 07/12/2024    9:12 AM 07/06/2024    8:13 AM  Last 3 Weights  Weight (lbs) 149 lb 147 lb 14.9 oz 147 lb 14.9 oz  Weight (kg) 67.586 kg 67.1 kg 67.1 kg     Body mass index is 29.1 kg/m.  General:  Well nourished, well developed, in no acute distress.  Appearing about stated age alert and orientated on room air HEENT: normal.  Surgical site on left side of neck visible. Neck: no JVD Vascular: No carotid bruits; Distal pulses 2+ bilaterally Cardiac:  normal S1, S2; RRR; no murmur  Lungs:  clear to auscultation bilaterally, no wheezing, rhonchi or rales  Abd: soft, nontender, no hepatomegaly  Ext: no edema Musculoskeletal:  No deformities. Skin: warm and dry  Neuro:   no focal abnormalities noted Psych:  Normal affect   EKG:  Taylor EKG was personally reviewed and demonstrates: Bush has not had an EKG done ordered EKG. Telemetry:  Telemetry was personally reviewed and demonstrates: Atrial fibrillation with heart rates in Taylor 60s to 70  Relevant CV Studies: Order echo  Laboratory Data: High Sensitivity Troponin:  No results for input(s): TROPONINIHS in Taylor last 720 hours.   Chemistry Recent Labs  Lab 08/21/24 0955  NA 138  K 4.2  CL 105  CO2 21*  GLUCOSE 92  BUN 14  CREATININE 1.58*  CALCIUM  8.2*  GFRNONAA 33*  ANIONGAP 12    No  results for input(s): PROT, ALBUMIN, AST, ALT, ALKPHOS, BILITOT in Taylor last 168 hours. Lipids No results for input(s): CHOL, TRIG, HDL, LABVLDL, LDLCALC, CHOLHDL in Taylor last 168 hours.  Hematology Recent Labs  Lab 08/21/24 0949  WBC 9.6  RBC 3.34*  HGB 10.2*  HCT 32.2*  MCV 96.4  MCH 30.5  MCHC 31.7  RDW 14.8  PLT 229   Thyroid  No results for input(s): TSH, FREET4 in Taylor last 168 hours.  BNPNo results for input(s): BNP, PROBNP in Taylor last 168 hours.  DDimer No results for input(s): DDIMER in Taylor last 168 hours.  Radiology/Studies:  No results found.   Assessment and Plan: Taylor Bush is a 78 y.o. female with a hx of tongue cancer, hypertension, hyperlipidemia, stage IIIb CKD, and PAD who is being seen 08/21/2024 for Taylor evaluation of atrial fibrillation at Taylor request of Reyes Loader, MD.  Tongue cancer She has an invasive carcinoma that was confirmed by biopsy.  Has previously had a partial glossectomy.  Taylor Bush was admitted by ENT for a left modified neck dissection.  Taylor modified neck dissection was done today and Taylor Bush developed atrial fibrillation.   New onset atrial fibrillation CHA2DS2-VASc Score = 5 [CHF History: 0, HTN History: 1, Diabetes History:  0, Stroke History: 0, Vascular Disease History: 1, Age Score: 2, Gender Score: 1].  Therefore, Taylor Bush's annual risk of stroke is 7.2 %.    Denies any prior history of atrial fibrillation. Denies any symptoms with atrial fibrillation such as palpitations, chest pain, or shortness of breath. Denies any symptoms of OSA.  Denies any alcohol  use. Potassium 4.2 at goal of greater than 4 Telemetry shows atrial fibrillation with heart rates in Taylor 60s to 70s. Plan to start Taylor Bush on anticoagulation when cleared to do so by ENT Ordered EKG Ordered TSH and magnesium  Ordered echocardiogram Continue metoprolol  succinate 200 mg daily   PAD On 07/2024 Taylor Bush underwent a  lower extremity angiogram for left SFA revascularization because of her critical limb ischemia and nonhealing foot wound.  Taylor femoral and popliteal artery were treated with laser arthrectomy, balloon angioplasty, and stents.  Medical management included aspirin , Plavix  and a statin.    Risk Assessment/Risk Scores:       CHA2DS2-VASc Score = 5   This indicates a 7.2% annual risk of stroke. Taylor Bush's score is based upon: CHF History: 0 HTN History: 1 Diabetes History: 0 Stroke History: 0 Vascular Disease History: 1 Age Score: 2 Gender Score: 1        For questions or updates, please contact [facility] Please consult [email or web address] for contact info under     Signed, Morse Clause, PA-C  08/21/2024 4:12 PM

## 2024-08-21 NOTE — Anesthesia Procedure Notes (Signed)
 Procedure Name: Intubation Date/Time: 08/21/2024 12:01 PM  Performed by: Elby Raelene SAUNDERS, CRNAPre-anesthesia Checklist: Patient identified, Emergency Drugs available, Suction available and Patient being monitored Patient Re-evaluated:Patient Re-evaluated prior to induction Oxygen Delivery Method: Circle System Utilized Preoxygenation: Pre-oxygenation with 100% oxygen Induction Type: IV induction Ventilation: Mask ventilation without difficulty Laryngoscope Size: Glidescope and 3 Grade View: Grade I Tube type: Oral Tube size: 7.0 mm Number of attempts: 1 Airway Equipment and Method: Stylet and Oral airway Placement Confirmation: ETT inserted through vocal cords under direct vision, positive ETCO2 and breath sounds checked- equal and bilateral Secured at: 21 cm Tube secured with: Tape Dental Injury: Teeth and Oropharynx as per pre-operative assessment

## 2024-08-21 NOTE — Transfer of Care (Signed)
 Immediate Anesthesia Transfer of Care Note  Patient: Taylor Bush  Procedure(s) Performed: DISSECTION, NECK, RADICAL (Left)  Patient Location: PACU  Anesthesia Type:General  Level of Consciousness: awake, alert , and oriented  Airway & Oxygen Therapy: Patient Spontanous Breathing  Post-op Assessment: Report given to RN and Post -op Vital signs reviewed and stable  Post vital signs: Reviewed and stable  Last Vitals:  Vitals Value Taken Time  BP 139/62 08/21/24 14:02  Temp    Pulse 83 08/21/24 14:04  Resp 20 08/21/24 14:04  SpO2 92 % 08/21/24 14:04  Vitals shown include unfiled device data.  Last Pain:  Vitals:   08/21/24 0923  TempSrc: Oral  PainSc: 0-No pain      Patients Stated Pain Goal: 0 (08/21/24 9076)  Complications: No notable events documented.

## 2024-08-22 ENCOUNTER — Inpatient Hospital Stay (HOSPITAL_COMMUNITY)

## 2024-08-22 ENCOUNTER — Other Ambulatory Visit (HOSPITAL_COMMUNITY): Payer: Self-pay

## 2024-08-22 ENCOUNTER — Telehealth (HOSPITAL_COMMUNITY): Payer: Self-pay

## 2024-08-22 ENCOUNTER — Encounter (HOSPITAL_COMMUNITY): Payer: Self-pay | Admitting: Otolaryngology

## 2024-08-22 DIAGNOSIS — I4891 Unspecified atrial fibrillation: Secondary | ICD-10-CM | POA: Diagnosis not present

## 2024-08-22 DIAGNOSIS — C029 Malignant neoplasm of tongue, unspecified: Secondary | ICD-10-CM | POA: Diagnosis not present

## 2024-08-22 DIAGNOSIS — I480 Paroxysmal atrial fibrillation: Secondary | ICD-10-CM | POA: Diagnosis not present

## 2024-08-22 LAB — ECHOCARDIOGRAM COMPLETE
Height: 60 in
S' Lateral: 2.6 cm
Weight: 2384 [oz_av]

## 2024-08-22 MED ORDER — PERFLUTREN LIPID MICROSPHERE
1.0000 mL | INTRAVENOUS | Status: AC | PRN
  Administered 2024-08-22: 2 mL via INTRAVENOUS

## 2024-08-22 MED ORDER — MAGNESIUM SULFATE 4 GM/100ML IV SOLN
4.0000 g | Freq: Once | INTRAVENOUS | Status: AC
  Administered 2024-08-22: 4 g via INTRAVENOUS
  Filled 2024-08-22: qty 100

## 2024-08-22 MED ORDER — HYDROCODONE-ACETAMINOPHEN 5-325 MG PO TABS: 1.0000 | ORAL_TABLET | Freq: Four times a day (QID) | ORAL | 0 refills | Status: DC | PRN

## 2024-08-22 MED ORDER — APIXABAN 5 MG PO TABS
5.0000 mg | ORAL_TABLET | Freq: Two times a day (BID) | ORAL | 3 refills | Status: AC
  Filled 2024-08-22: qty 60, 30d supply, fill #0

## 2024-08-22 MED ORDER — ONDANSETRON 4 MG PO TBDP: 4.0000 mg | ORAL_TABLET | Freq: Three times a day (TID) | ORAL | 0 refills | Status: DC | PRN

## 2024-08-22 NOTE — Discharge Summary (Signed)
 Physician Discharge Summary  Patient ID: Taylor Bush MRN: [id number] DOB/AGE: 04-22-1946 78 y.o.  Admit date: 08/21/2024 Discharge date: 08/22/2024  Admission Diagnoses: Tongue cancer  Discharge Diagnoses:  Principal Problem:   Tongue cancer [facility]) Active Problems:   Paroxysmal atrial fibrillation [facility])   Discharged Condition: good  Hospital Course: Complicated by new onset atrial fibrillation during the procedure.  Consulted cardiology.  Remained asymptomatic.  Will follow-up with cardiology.  Consults: none  Significant Diagnostic Studies: none  Treatments: surgery: Neck dissection, left side, selective  Discharge Exam: Blood pressure (!) 114/57, pulse 73, temperature 98.1 F (36.7 C), temperature source Oral, resp. rate 18, height 5' (1.524 m), weight 67.6 kg, SpO2 96%. PHYSICAL EXAM: Awake and alert.  Breathing and voice are clear.  Incision line looks good.  Drain in place.  Disposition: Discharge disposition: 01-Home or Self Care       Discharge Instructions     Diet - low sodium heart healthy   Complete by: As directed    Discharge wound care:   Complete by: As directed    Empty drain 3 times daily, record the amount, recharge the drain.  Apply bacitracin  ointment to the staples twice daily.   Increase activity slowly   Complete by: As directed       Allergies as of 08/22/2024       Reactions   Prednisone Other (See Comments)   Joint pain,joint stiffness   Statins Rash   hands broke out        Medication List     STOP taking these medications    aspirin  81 MG tablet       TAKE these medications    amLODipine  10 MG tablet Commonly known as: NORVASC  TAKE 1 TABLET BY MOUTH DAILY   apixaban 5 MG Tabs tablet Commonly known as: ELIQUIS Take 1 tablet (5 mg total) by mouth 2 (two) times daily. Start taking on: August 24, 2024   atorvastatin  80 MG tablet Commonly known as: LIPITOR  Take 1 tablet (80 mg total) by mouth daily.    clopidogrel  75 MG tablet Commonly known as: PLAVIX  Take 1 tablet (75 mg total) by mouth daily with breakfast.   HYDROcodone -acetaminophen  5-325 MG tablet Commonly known as: NORCO/VICODIN Take 1 tablet by mouth every 6 (six) hours as needed for moderate pain (pain score 4-6).   lisinopril  20 MG tablet Commonly known as: ZESTRIL  TAKE 1 TABLET BY MOUTH DAILY   metoprolol  200 MG 24 hr tablet Commonly known as: TOPROL -XL TAKE 1 TABLET BY MOUTH DAILY   mupirocin  ointment 2 % Commonly known as: BACTROBAN  Place 1 Application into the nose 2 (two) times daily.   nitroGLYCERIN  0.2 mg/hr patch Commonly known as: NITRODUR - Dosed in mg/24 hr Place 1 patch (0.2 mg total) onto the skin daily.   ondansetron  4 MG disintegrating tablet Commonly known as: ZOFRAN -ODT Take 1 tablet (4 mg total) by mouth every 8 (eight) hours as needed for nausea or vomiting. What changed: Another medication with the same name was added. Make sure you understand how and when to take each.   ondansetron  4 MG disintegrating tablet Commonly known as: ZOFRAN -ODT Take 1 tablet (4 mg total) by mouth every 8 (eight) hours as needed for nausea or vomiting. What changed: You were already taking a medication with the same name, and this prescription was added. Make sure you understand how and when to take each.   senna-docusate 8.6-50 MG tablet Commonly known as: Senokot-S Take 1 tablet by mouth  at bedtime as needed for mild constipation.   VABYSMO  IZ 1 Dose by Intravitreal route every 6 (six) weeks.               Discharge Care Instructions  (From admission, onward)           Start     Ordered   08/22/24 0000  Discharge wound care:       Comments: Empty drain 3 times daily, record the amount, recharge the drain.  Apply bacitracin  ointment to the staples twice daily.   08/22/24 1251            Follow-up Information     Jesus Oliphant, MD Follow up on 08/27/2024.   Specialty:  Otolaryngology Why: 1:00 pm at office for drain removal Contact information: [address] [place] [address] [phone]                 Signed: Oliphant Jesus 08/22/2024, 12:51 PM

## 2024-08-22 NOTE — Telephone Encounter (Signed)
 Pharmacy Patient Advocate Encounter  Insurance verification completed.    The patient is insured through  [employer] . Patient has Medicare and is not eligible for a copay card, but may be able to apply for patient assistance or Medicare RX Payment Plan (Patient Must reach out to their plan, if eligible for payment plan), if available.    Ran test claim for Eliquis and the current 30 day co-pay is $0.00.   This test claim was processed through [facility] copay amounts may vary at other pharmacies due to pharmacy/plan contracts, or as the patient moves through the different stages of their insurance plan.

## 2024-08-22 NOTE — Discharge Instructions (Signed)
Information on my medicine - ELIQUIS (apixaban)  This medication education was reviewed with me or my healthcare representative as part of my discharge preparation.  The pharmacist that spoke with me during my hospital stay was:  Einar Grad, [facility]  Why was Eliquis prescribed for you? Eliquis was prescribed for you to reduce the risk of a blood clot forming that can cause a stroke if you have a medical condition called atrial fibrillation (a type of irregular heartbeat).  What do You need to know about Eliquis ? Take your Eliquis TWICE DAILY - one tablet in the morning and one tablet in the evening with or without food. If you have difficulty swallowing the tablet whole please discuss with your pharmacist how to take the medication safely.  Take Eliquis exactly as prescribed by your doctor and DO NOT stop taking Eliquis without talking to the doctor who prescribed the medication.  Stopping may increase your risk of developing a stroke.  Refill your prescription before you run out.  After discharge, you should have regular check-up appointments with your healthcare provider that is prescribing your Eliquis.  In the future your dose may need to be changed if your kidney function or weight changes by a significant amount or as you get older.  What do you do if you miss a dose? If you miss a dose, take it as soon as you remember on the same day and resume taking twice daily.  Do not take more than one dose of ELIQUIS at the same time to make up a missed dose.  Important Safety Information A possible side effect of Eliquis is bleeding. You should call your healthcare provider right away if you experience any of the following: Bleeding from an injury or your nose that does not stop. Unusual colored urine (red or dark brown) or unusual colored stools (red or black). Unusual bruising for unknown reasons. A serious fall or if you hit your head (even if there is no bleeding).  Some  medicines may interact with Eliquis and might increase your risk of bleeding or clotting while on Eliquis. To help avoid this, consult your healthcare provider or pharmacist prior to using any new prescription or non-prescription medications, including herbals, vitamins, non-steroidal anti-inflammatory drugs (NSAIDs) and supplements.  This website has more information on Eliquis (apixaban): http://www.eliquis.com/eliquis/home

## 2024-08-22 NOTE — Anesthesia Postprocedure Evaluation (Signed)
 Anesthesia Post Note  Patient: Taylor Bush  Procedure(s) Performed: DISSECTION, NECK, RADICAL (Left)     Patient location during evaluation: PACU Anesthesia Type: General Level of consciousness: awake and alert Pain management: pain level controlled Vital Signs Assessment: post-procedure vital signs reviewed and stable Respiratory status: spontaneous breathing, nonlabored ventilation, respiratory function stable and patient connected to nasal cannula oxygen Cardiovascular status: blood pressure returned to baseline and stable Postop Assessment: no apparent nausea or vomiting Anesthetic complications: no   No notable events documented.  Last Vitals:  Vitals:   08/22/24 0310 08/22/24 0739  BP: 125/62 (!) 114/57  Pulse: 75 73  Resp: 15 18  Temp: 37.1 C 36.7 C  SpO2: 92% 96%    Last Pain:  Vitals:   08/22/24 1108  TempSrc:   PainSc: 0-No pain                 Epifanio Lamar BRAVO

## 2024-08-22 NOTE — Progress Notes (Signed)
  Progress Note  Patient Name: Taylor Bush Date of Encounter: 08/22/2024 [facility] Cardiologist: Peter Swaziland, MD   Interval Summary    Doing well this morning, hopeful to DC home. Cleared by ENT.   Vital Signs Vitals:   08/21/24 1645 08/21/24 1953 08/22/24 0310 08/22/24 0739  BP: 134/76 108/63 125/62 (!) 114/57  Pulse: 74 82 75 73  Resp: 17 15 15 18   Temp: 98.4 F (36.9 C) 98.6 F (37 C) 98.8 F (37.1 C) 98.1 F (36.7 C)  TempSrc: Oral Oral Oral Oral  SpO2: 97% 94% 92% 96%  Weight:      Height:        Intake/Output Summary (Last 24 hours) at 08/22/2024 0850 Last data filed at 08/22/2024 0740 Gross per 24 hour  Intake 1196.8 ml  Output 95 ml  Net 1101.8 ml      08/21/2024    9:23 AM 07/12/2024    9:12 AM 07/06/2024    8:13 AM  Last 3 Weights  Weight (lbs) 149 lb 147 lb 14.9 oz 147 lb 14.9 oz  Weight (kg) 67.586 kg 67.1 kg 67.1 kg      Telemetry/ECG   Atrial fibrillation rates 50-60s - Personally Reviewed  Physical Exam  GEN: No acute distress.   Neck: Drain to left side of neck Cardiac: Irreg Irreg , no murmurs, rubs, or gallops.  Respiratory: Clear to auscultation bilaterally. GI: Soft, nontender, non-distended  MS: No edema  Assessment & Plan   Taylor Bush is a 78 y.o. female with a hx of tongue cancer, hypertension, hyperlipidemia, stage IIIb CKD, and PAD who was seen 08/21/2024 for the evaluation of atrial fibrillation at the request of Reyes Loader, MD.   New onset atrial fibrillation -- developed post op, rates remain well controlled  -- TSH 1.187 -- continue on Toprol  XL 200mg  daily ( PTA med) -- per ENT ok to start Eliquis 5mg  BID on Saturday -- echo pending  PAD -- has been on DAPT, with need for Eliquis would stop ASA, continue plavix   -- on statin   Tongue Ca -- per ENT  Hypomag -- Mag + 1.3, suppl  Pending echo, but otherwise ok to DC home from cardiology standpoint. Will arrange outpatient follow up. Co-pay check  for Eliquis 0$  For questions or updates, please contact  [facility] Please consult [email or web address] for contact info under   Signed, Manuelita Rummer, NP

## 2024-08-22 NOTE — Progress Notes (Signed)
 Subjective: Febrile overnight, minimal discomfort.  Eating and drinking, walking around.  Objective: Vital signs in last 24 hours: Temp:  [97.6 F (36.4 C)-98.9 F (37.2 C)] 98.1 F (36.7 C) (10/16 0739) Pulse Rate:  [69-83] 73 (10/16 0739) Resp:  [15-21] 18 (10/16 0739) BP: (108-139)/(57-76) 114/57 (10/16 0739) SpO2:  [90 %-99 %] 96 % (10/16 0739) Weight:  [67.6 kg] 67.6 kg (10/15 0923) Weight change:  Last BM Date : 08/21/24  Intake/Output from previous day: 10/15 0701 - 10/16 0700 In: 956.8 [I.V.:956.8] Out: 95 [Drains:85; Blood:10] Intake/Output this shift: Total I/O In: 240 [P.O.:240] Out: -   PHYSICAL EXAM: Awake and alert.  Breathing and voice are clear.  Facial nerve function is symmetric.  Neck looks good.  Flaps are down.  No swelling or hematoma.  Drain is functioning with a good seal.  Lab Results: Recent Labs    08/21/24 0949  WBC 9.6  HGB 10.2*  HCT 32.2*  PLT 229   BMET Recent Labs    08/21/24 0955  NA 138  K 4.2  CL 105  CO2 21*  GLUCOSE 92  BUN 14  CREATININE 1.58*  CALCIUM  8.2*    Studies/Results: DG CHEST PORT 1 VIEW Result Date: 08/21/2024 CLINICAL DATA:  Atrial fibrillation EXAM: PORTABLE CHEST 1 VIEW COMPARISON:  05/03/2017 FINDINGS: Heart and mediastinal contours are within normal limits. No focal opacities or effusions. No acute bony abnormality. IMPRESSION: No active disease. Electronically Signed   By: Franky Crease M.D.   On: 08/21/2024 19:29    Medications: I have reviewed the patient's current medications.  Assessment/Plan: Postop day 1 modified neck dissection for oral cancer.  Doing well from a surgical perspective.  New onset A-fib.  Appreciate cardiology input.  May start Eliquis on Saturday.  May discharge home today or any other time when okayed by cardiology we will plan to see her back Tuesday for drain removal.  LOS: 1 day      Taylor Bush 08/22/2024, 8:35 AM

## 2024-08-22 NOTE — Progress Notes (Signed)
  Echocardiogram 2D Echocardiogram has been performed.  Tinnie FORBES Gosling RDCS 08/22/2024, 10:43 AM

## 2024-08-23 ENCOUNTER — Telehealth: Payer: Self-pay

## 2024-08-23 NOTE — Transitions of Care (Post Inpatient/ED Visit) (Signed)
 08/23/2024  Name: Taylor Bush MRN: [id number] DOB: [birth year]  Today's TOC FU Call Status: Today's TOC FU Call Status:: Successful TOC FU Call Completed TOC FU Call Complete Date: 08/23/24 Patient's Name and Date of Birth confirmed.  Transition Care Management Follow-up Telephone Call Date of Discharge: 08/22/24 Discharge Facility: Taylor Bush) Type of Discharge: Inpatient Admission Primary Inpatient Discharge Diagnosis:: Tongue Cancer How have you been since you were released from the hospital?: Better Any questions or concerns?: Yes Patient Questions/Concerns:: Wants some Tramadol  for her back pain Patient Questions/Concerns Addressed: Notified Provider of Patient Questions/Concerns  Items Reviewed: Did you receive and understand the discharge instructions provided?: Yes Medications obtained,verified, and reconciled?: Yes (Medications Reviewed) Any new allergies since your discharge?: No Dietary orders reviewed?: Yes Type of Diet Ordered:: Low sodium Heart Helathy Do you have support at home?: Yes People in Home [RPT]: child(ren), adult Name of Support/Comfort Primary Source: Taylor Bush  Medications Reviewed Today: Medications Reviewed Today     Reviewed by Taylor Reusing, RN (Case Manager) on 08/23/24 at 1612  Med List Status: <None>   Medication Order Taking? Sig Documenting Provider Last Dose Status Informant  amLODipine  (NORVASC ) 10 MG tablet [id number] No TAKE 1 TABLET BY MOUTH DAILY Bush, Spencer, MD 08/21/2024  7:00 AM Active Self  apixaban (ELIQUIS) 5 MG TABS tablet [id number]  Take 1 tablet (5 mg total) by mouth 2 (two) times daily. Taylor Manuelita NOVAK, NP  Active   atorvastatin  (LIPITOR ) 80 MG tablet [id number] No Take 1 tablet (80 mg total) by mouth daily. Bush, Vijaya, MD 08/21/2024  7:00 AM Active Self  clopidogrel  (PLAVIX ) 75 MG tablet [id number] No Take 1 tablet (75 mg total) by mouth daily with breakfast. Taylor Labella, MD 08/13/2024 Active Self   Faricimab -svoa (VABYSMO  IZ) [id number] No 1 Dose by Intravitreal route every 6 (six) weeks. [provider] Past Month Active Self           Med Note Taylor Bush Jun 13, 2024  1:59 PM) Given at MD office  HYDROcodone -acetaminophen  (NORCO/VICODIN) 5-325 MG tablet [id number]  Take 1 tablet by mouth every 6 (six) hours as needed for moderate pain (pain score 4-6). Taylor Oliphant, MD  Active   lisinopril  (ZESTRIL ) 20 MG tablet [id number] No TAKE 1 TABLET BY MOUTH DAILY Bush, Spencer, MD 08/20/2024 Active Self  metoprolol  (TOPROL -XL) 200 MG 24 hr tablet [id number] No TAKE 1 TABLET BY MOUTH DAILY Bush, Spencer, MD 08/21/2024  7:00 AM Active Self  mupirocin  ointment (BACTROBAN ) 2 % [id number] No Place 1 Application into the nose 2 (two) times daily.  Patient not taking: Reported on 08/12/2024   Bush, Vijaya, MD Not Taking Active Self  nitroGLYCERIN  (NITRODUR - DOSED IN MG/24 HR) 0.2 mg/hr patch [id number] No Place 1 patch (0.2 mg total) onto the skin daily. Taylor Maurilio CHRISTELLA, PA-C 08/20/2024 Active Self  ondansetron  (ZOFRAN -ODT) 4 MG disintegrating tablet [id number] No Take 1 tablet (4 mg total) by mouth every 8 (eight) hours as needed for nausea or vomiting.  Patient not taking: Reported on 08/12/2024   Taylor Oliphant, MD Not Taking Active Self  ondansetron  (ZOFRAN -ODT) 4 MG disintegrating tablet [id number]  Take 1 tablet (4 mg total) by mouth every 8 (eight) hours as needed for nausea or vomiting. Taylor Oliphant, MD  Active   senna-docusate (SENOKOT-S) 8.6-50 MG tablet [id number] No Take 1 tablet by mouth at bedtime as needed for mild constipation.  Patient not taking: Reported on 08/12/2024   Taylor Labella, MD Not  Taking Active Self            Home Care and Equipment/Supplies: Were Home Health Services Ordered?: NA Any new equipment or medical supplies ordered?: NA  Functional Questionnaire: Do you need assistance with bathing/showering or dressing?: No Do you need assistance with  meal preparation?: No Do you need assistance with eating?: No Do you have difficulty maintaining continence: No Do you need assistance with getting out of bed/getting out of a chair/moving?: No Do you have difficulty managing or taking your medications?: No  Follow up appointments reviewed: PCP Follow-up appointment confirmed?: NA Specialist Hospital Follow-up appointment confirmed?: Yes Date of Specialist follow-up appointment?: 08/27/24 Follow-Up Specialty Provider:: Dr. Harden Do you need transportation to your follow-up appointment?: No Do you understand care options if your condition(s) worsen?: Yes-patient verbalized understanding  SDOH Interventions Today    Flowsheet Row Most Recent Value  SDOH Interventions   Food Insecurity Interventions Intervention Not Indicated  Housing Interventions Intervention Not Indicated  Transportation Interventions Intervention Not Indicated  Utilities Interventions Intervention Not Indicated    Medford Balboa, BSN, RN [place]  [facility] - [facility] RN Care Manager [phone]

## 2024-08-24 MED ORDER — TRAMADOL HCL 50 MG PO TABS: 50.0000 mg | ORAL_TABLET | Freq: Three times a day (TID) | ORAL | 2 refills | Status: AC | PRN

## 2024-08-24 NOTE — Addendum Note (Signed)
 Addended by: WATT MIRZA on: 08/24/2024 08:49 AM   Modules accepted: Orders

## 2024-08-27 ENCOUNTER — Ambulatory Visit (INDEPENDENT_AMBULATORY_CARE_PROVIDER_SITE_OTHER): Admitting: Orthopedic Surgery

## 2024-08-27 DIAGNOSIS — S91352A Open bite, left foot, initial encounter: Secondary | ICD-10-CM

## 2024-08-27 DIAGNOSIS — W540XXA Bitten by dog, initial encounter: Secondary | ICD-10-CM

## 2024-08-29 ENCOUNTER — Encounter: Payer: Self-pay | Admitting: Orthopedic Surgery

## 2024-08-29 NOTE — Progress Notes (Signed)
 Office Visit Note   Patient: Taylor Bush           Date of Birth: 01/24/1946           MRN: [id number] Visit Date: 08/27/2024              Requested by: Watt Mirza, MD [address] [place],  [place] [address] PCP: Watt Mirza, MD  Chief Complaint  Patient presents with   Left Foot - Routine Post Op    07/12/24 I&D left foot dog bite      HPI: Discussed the use of AI scribe software for clinical note transcription with the patient, who gave verbal consent to proceed.  History of Present Illness Taylor Bush is a 78 year old female who presents with swelling and weeping edema following recent lymph node surgery.  She underwent surgery on Wednesday to remove lymph nodes from her cervical region. Since the procedure, she has been experiencing significant swelling and weeping edema at the surgical site.  She has been using a Vosh dressing as part of her wound care regimen.  She mentions difficulty in keeping the affected area elevated, which is contributing to the persistent swelling.  She attempted to obtain the Vosh dressing from [facility] but was unable to find it there. She was informed of an alternative location where the dressing is available.     Assessment & Plan: Visit Diagnoses: No diagnosis found.  Plan: Assessment and Plan Assessment & Plan Chronic lower extremity wound with localized edema Wound measures 3.5x3.5 cm with healthy granulation. Edema due to dependency. Healing well but requires swelling reduction for closure. - Continue Vosh dressing changes. - Wash with soap and water, apply gauze, wrap with Ace Wrap. - Encouraged foot elevation to reduce swelling.      Follow-Up Instructions: No follow-ups on file.   Ortho Exam  Patient is alert, oriented, no adenopathy, well-dressed, normal affect, normal respiratory effort. Physical Exam NECK: Recent lymph node biopsy from cervical region. EXTREMITIES: Foot with flat, healthy  granulation tissue over 3.5x3.5 cm wound. Weeping edema from dependent foot.      Imaging: No results found. No images are attached to the encounter.  Labs: Lab Results  Component Value Date   HGBA1C 5.5 07/07/2024   HGBA1C 5.7 09/14/2023   HGBA1C 5.9 09/07/2022   CRP 0.8 07/10/2024   CRP <0.5 07/09/2024   CRP 0.5 07/08/2024   REPTSTATUS 07/11/2024 FINAL 07/06/2024   CULT  07/06/2024    NO GROWTH 5 DAYS Performed at [facility], [address] [place], [place] [address]      Lab Results  Component Value Date   ALBUMIN 3.1 (L) 07/06/2024   ALBUMIN 3.9 09/14/2023   ALBUMIN 4.1 09/07/2022    Lab Results  Component Value Date   MG 1.3 (L) 08/21/2024   MG 2.0 07/13/2024   MG 2.3 07/12/2024   Lab Results  Component Value Date   VD25OH 27.56 (L) 09/14/2023   VD25OH 37.20 09/07/2022   VD25OH 26.75 (L) 09/13/2021    No results found for: PREALBUMIN    Latest Ref Rng & Units 08/21/2024    9:49 AM 07/13/2024    5:12 AM 07/12/2024    5:45 PM  CBC EXTENDED  WBC 4.0 - 10.5 K/uL 9.6  15.1    RBC 3.87 - 5.11 MIL/uL 3.34  2.80    Hemoglobin 12.0 - 15.0 g/dL 89.7  8.8  9.1   HCT 63.9 -  46.0 % 32.2  26.4  27.5   Platelets 150 - 400 K/uL 229  235       There is no height or weight on file to calculate BMI.  Orders:  No orders of the defined types were placed in this encounter.  No orders of the defined types were placed in this encounter.    Procedures: No procedures performed  Clinical Data: No additional findings.  ROS:  All other systems negative, except as noted in the HPI. Review of Systems  Objective: Vital Signs: There were no vitals taken for this visit.  Specialty Comments:  No specialty comments available.  PMFS History: Patient Active Problem List   Diagnosis Date Noted   Paroxysmal atrial fibrillation (HCC) 08/21/2024   Peripheral arterial disease 07/12/2024   Dog bite of ankle, sequela 07/06/2024   Gangrene of left foot (HCC)  07/06/2024   Tongue cancer (HCC) 06/17/2024   B12 deficiency 09/13/2021   Allergic rhinitis    CKD (chronic kidney disease) stage 3, GFR 30-59 ml/min (HCC) 11/29/2013   Anemia of chronic disease 12/16/2011   Hx of total hip arthroplasty 12/15/2011   Atherosclerosis of native artery of extremity with intermittent claudication 06/17/2009   Lumbar back pain with radiculopathy affecting left lower extremity 06/03/2009   HYPERLIPIDEMIA 12/15/2008   Essential hypertension 12/15/2008   Open-angle glaucoma 10/27/2008   Past Medical History:  Diagnosis Date   Allergic rhinitis    Allergy    Anemia in chronic kidney disease (CKD)    Arthritis    B12 deficiency 09/13/2021   Blood transfusion 2012   and 2013   Blood transfusion without reported diagnosis    Cataract    bilateral sx   Chronic low back pain    with radiculopathy   CKD (chronic kidney disease) stage 3, GFR 30-59 ml/min (HCC) 11/29/2013   HLD (hyperlipidemia)    HTN (hypertension)    Hx of total hip arthroplasty 12/15/2011   Open-angle glaucoma    PAD (peripheral artery disease)    with intermittent claudication, ABI 0.72 on Rt and 0.73 on L when assessed in 2010.     Family History  Problem Relation Age of Onset   Colon polyps Mother 60   Heart attack Father        63s   Stroke Neg Hx        also no PAD or aneurysm   Colon cancer Neg Hx    Esophageal cancer Neg Hx    Stomach cancer Neg Hx    Rectal cancer Neg Hx     Past Surgical History:  Procedure Laterality Date   ABDOMINAL AORTOGRAM W/LOWER EXTREMITY N/A 07/09/2024   Procedure: ABDOMINAL AORTOGRAM W/LOWER EXTREMITY;  Surgeon: Serene Gaile ORN, MD;  Location: [facility];  Service: Cardiovascular;  Laterality: N/A;   abnormal vascular studies  05/07/2009   ABI's 70, R SFA > 50%^ blockage    APPLICATION OF WOUND VAC Left 07/12/2024   Procedure: APPLICATION, WOUND VAC;  Surgeon: Harden Jerona GAILS, MD;  Location: [facility];  Service: Orthopedics;  Laterality: Left;    CATARACT EXTRACTION, BILATERAL  2011   EXCISION OF TONGUE LESION WITH LASER N/A 08/31/2015   Procedure: BIOPSY AND CO2 LASER OF TONGUE LESION ;  Surgeon: Ida Loader, MD;  Location: [facility];  Service: ENT;  Laterality: N/A;   EXCISION OF TONGUE LESION WITH LASER Left 06/29/2020   Procedure: EXCISION OF TONGUE LESION/CO2 laser w/biopsy;  Surgeon: Loader Ida, MD;  Location: [facility];  Service: ENT;  Laterality: Left;   EXCISION OF TONGUE LESION WITH LASER Bilateral 10/02/2023   Procedure: ORAL BIOPSY AND CO2 LASER ABLATATION OF TONGUE LESION;  Surgeon: Jesus Oliphant, MD;  Location: [facility];  Service: ENT;  Laterality: Bilateral;   EXCISION ORAL LESION WITH CO2 LASER N/A 12/08/2021   Procedure: EXCISION OF ORAL ORAL CAVITY LESION WITH CO2 LASER;  Surgeon: Jesus Oliphant, MD;  Location:  SURGERY CENTER;  Service: ENT;  Laterality: N/A;   EYE SURGERY     GLOSSECTOMY, PARTIAL Left 06/17/2024   Procedure: GLOSSECTOMY, PARTIAL;  Surgeon: Jesus Oliphant, MD;  Location: [facility];  Service: ENT;  Laterality: Left;   INCISION AND DRAINAGE OF WOUND Left 07/12/2024   Procedure: IRRIGATION AND DEBRIDEMENT WOUND;  Surgeon: Harden Jerona GAILS, MD;  Location: [facility] OR;  Service: Orthopedics;  Laterality: Left;  LEFT FOOT DEBRIDEMENT   JOINT REPLACEMENT     LOWER EXTREMITY ANGIOGRAPHY  07/09/2024   Procedure: Lower Extremity Angiography;  Surgeon: Serene Gaile ORN, MD;  Location: [facility];  Service: Cardiovascular;;   LOWER EXTREMITY INTERVENTION  07/09/2024   Procedure: LOWER EXTREMITY INTERVENTION;  Surgeon: Serene Gaile ORN, MD;  Location: [facility];  Service: Cardiovascular;;   OTHER SURGICAL HISTORY     ganglion cyst removed left wrist    PERIPHERAL INTRAVASCULAR LITHOTRIPSY  07/09/2024   Procedure: PERIPHERAL INTRAVASCULAR LITHOTRIPSY;  Surgeon: Serene Gaile ORN, MD;  Location: [facility];  Service: Cardiovascular;;   PERIPHERAL VASCULAR ATHERECTOMY   07/09/2024   Procedure: PERIPHERAL VASCULAR ATHERECTOMY;  Surgeon: Serene Gaile ORN, MD;  Location: [facility];  Service: Cardiovascular;;   RADICAL NECK DISSECTION Left 08/21/2024   Procedure: DISSECTION, NECK, RADICAL;  Surgeon: Jesus Oliphant, MD;  [facility];  Service: ENT;  Laterality: Left;   TOTAL HIP ARTHROPLASTY  10/03/2011   Procedure: TOTAL HIP ARTHROPLASTY;  Surgeon: Dempsey GAILS Moan;  Location: [facility];  Service: Orthopedics;  Laterality: Right;   TOTAL HIP ARTHROPLASTY  12/21/2011   Procedure: TOTAL HIP ARTHROPLASTY;  Surgeon: Dempsey GAILS Moan, MD;  Location: [facility];  Service: Orthopedics;  Laterality: Left;   TUBAL LIGATION     Social History   Occupational History   Not on file  Tobacco Use   Smoking status: Former    Current packs/day: 0.00    Types: Cigarettes    Quit date: 11/07/2008    Years since quitting: 15.8   Smokeless tobacco: Never  Vaping Use   Vaping status: Never Used  Substance and Sexual Activity   Alcohol  use: No   Drug use: No   Sexual activity: Not on file

## 2024-09-03 NOTE — Progress Notes (Signed)
 Return visit.  Doing well.  Surgical site is healing nicely.  Staples removed.  Plan recheck in 4 months.  Referral in for radiation oncology.

## 2024-09-04 NOTE — Progress Notes (Unsigned)
 Cardiology Office Note:    Date:  09/05/2024   ID:  Taylor Bush, Taylor Bush 11-Apr-1946, MRN [id number]  PCP:  Watt Mirza, MD   [facility] Providers Cardiologist:  Peter Jordan, MD {  Referring MD: Watt Mirza, MD   Chief Complaint  Patient presents with   Follow-up    PAF, HTN   History of Present Illness:    Taylor Bush is a 78 y.o. female with a hx of hypertension, hyperlipidemia, stage IIIb CKD, PAD, tongue cancer, new onset A-Fib (08/2024) that developed post-op who is being seen for hospital follow up.  He was recently admitted to the hospital to undergo left neck dissection in the setting of tongue cancer. During his procedure he developed A-Fib that remained rate controlled. Labs were checked while in the hospital; TSH normal, low magnesium , potassium 4.2. Echo was completed that showed: LVEF 55-60%, moderately reduced RV systolic function, severe LAE, mild MR, mild to moderate TR, normal IVC. She remained in A-Fib while in the hospital. She was continued on her home Toprol  200 mg daily and recommended to start on Eliquis when appropriate per ENT.   Prior to this, she has no known history of A-Fib. She had a history of PAD and was previously on DAPT but ASA was held at discharge with initiation of Eliquis.   She has been following closely with her otolaryngologist, Dr. Jesus with [facility], who just saw her 10/28 for her post-op check where she had her staples removed. He noted she was doing well, incisions were healing well and the plan was to recheck in 4 months with referral to radiation oncology.   Today in clinic, she reports feeling fairly well. She denies any chest pain, palpitations, shortness of breath, lightheadedness, syncope, presyncope or signs of bleeding. She tells me that overall she feels a bit fatigued but is unsure if this is in relation to her cancer, recent surgery, or A-Fib. She reports still being able to complete all of her ADLs, just feeling  more tired during the day. We discussed pathophysiology of A-Fib, rate vs. rhythm control.  She remains fairly asymptomatic while in rate controlled A-Fib and would like to follow up once she has completed her radiation therapy to discuss further options for potential rhythm control. We discussed how the longer she remains in A-Fib the harder it will be to convert to NSR as well as due to the severe dilation of her LA there is a chance DCCV may not last. She is aware of all these and is still wanting to follow up in 2 months to discuss options.   Past Medical History:  Diagnosis Date   Allergic rhinitis    Allergy    Anemia in chronic kidney disease (CKD)    Arthritis    B12 deficiency 09/13/2021   Blood transfusion 2012   and 2013   Blood transfusion without reported diagnosis    Cataract    bilateral sx   Chronic low back pain    with radiculopathy   CKD (chronic kidney disease) stage 3, GFR 30-59 ml/min (HCC) 11/29/2013   HLD (hyperlipidemia)    HTN (hypertension)    Hx of total hip arthroplasty 12/15/2011   Open-angle glaucoma    PAD (peripheral artery disease)    with intermittent claudication, ABI 0.72 on Rt and 0.73 on L when assessed in 2010.    Past Surgical History:  Procedure Laterality Date   ABDOMINAL AORTOGRAM W/LOWER EXTREMITY N/A 07/09/2024   Procedure:  ABDOMINAL AORTOGRAM W/LOWER EXTREMITY;  Surgeon: Serene Gaile ORN, MD;  Location: [facility];  Service: Cardiovascular;  Laterality: N/A;   abnormal vascular studies  05/07/2009   ABI's 70, R SFA > 50%^ blockage    APPLICATION OF WOUND VAC Left 07/12/2024   Procedure: APPLICATION, WOUND VAC;  Surgeon: Harden Jerona GAILS, MD;  Location: [facility];  Service: Orthopedics;  Laterality: Left;   CATARACT EXTRACTION, BILATERAL  2011   EXCISION OF TONGUE LESION WITH LASER N/A 08/31/2015   Procedure: BIOPSY AND CO2 LASER OF TONGUE LESION ;  Surgeon: Ida Loader, MD;  Location: [facility];  Service: ENT;  Laterality:  N/A;   EXCISION OF TONGUE LESION WITH LASER Left 06/29/2020   Procedure: EXCISION OF TONGUE LESION/CO2 laser w/biopsy;  Surgeon: Loader Ida, MD;  Location: [facility];  Service: ENT;  Laterality: Left;   EXCISION OF TONGUE LESION WITH LASER Bilateral 10/02/2023   Procedure: ORAL BIOPSY AND CO2 LASER ABLATATION OF TONGUE LESION;  Surgeon: Loader Ida, MD;  Location: [facility];  Service: ENT;  Laterality: Bilateral;   EXCISION ORAL LESION WITH CO2 LASER N/A 12/08/2021   Procedure: EXCISION OF ORAL ORAL CAVITY LESION WITH CO2 LASER;  Surgeon: Loader Ida, MD;  Location: [facility];  Service: ENT;  Laterality: N/A;   EYE SURGERY     GLOSSECTOMY, PARTIAL Left 06/17/2024   Procedure: GLOSSECTOMY, PARTIAL;  Surgeon: Loader Ida, MD;  Location: [facility] OR;  Service: ENT;  Laterality: Left;   INCISION AND DRAINAGE OF WOUND Left 07/12/2024   Procedure: IRRIGATION AND DEBRIDEMENT WOUND;  Surgeon: Harden Jerona GAILS, MD;  Location: [facility] OR;  Service: Orthopedics;  Laterality: Left;  LEFT FOOT DEBRIDEMENT   JOINT REPLACEMENT     LOWER EXTREMITY ANGIOGRAPHY  07/09/2024   Procedure: Lower Extremity Angiography;  Surgeon: Serene Gaile ORN, MD;  Location: [facility];  Service: Cardiovascular;;   LOWER EXTREMITY INTERVENTION  07/09/2024   Procedure: LOWER EXTREMITY INTERVENTION;  Surgeon: Serene Gaile ORN, MD;  Location: [facility];  Service: Cardiovascular;;   OTHER SURGICAL HISTORY     ganglion cyst removed left wrist    PERIPHERAL INTRAVASCULAR LITHOTRIPSY  07/09/2024   Procedure: PERIPHERAL INTRAVASCULAR LITHOTRIPSY;  Surgeon: Serene Gaile ORN, MD;  Location: [facility];  Service: Cardiovascular;;   PERIPHERAL VASCULAR ATHERECTOMY  07/09/2024   Procedure: PERIPHERAL VASCULAR ATHERECTOMY;  Surgeon: Serene Gaile ORN, MD;  Location: [facility];  Service: Cardiovascular;;   RADICAL NECK DISSECTION Left 08/21/2024   Procedure: DISSECTION, NECK, RADICAL;  Surgeon: Loader Ida, MD;  Location: [facility] OR;  Service: ENT;  Laterality: Left;   TOTAL HIP ARTHROPLASTY  10/03/2011   Procedure: TOTAL HIP ARTHROPLASTY;  Surgeon: Dempsey GAILS Moan;  Location: [facility];  Service: Orthopedics;  Laterality: Right;   TOTAL HIP ARTHROPLASTY  12/21/2011   Procedure: TOTAL HIP ARTHROPLASTY;  Surgeon: Dempsey GAILS Moan, MD;  Location: [facility];  Service: Orthopedics;  Laterality: Left;   TUBAL LIGATION     Current Medications: Current Meds  Medication Sig   amLODipine  (NORVASC ) 10 MG tablet TAKE 1 TABLET BY MOUTH DAILY   apixaban (ELIQUIS) 5 MG TABS tablet Take 1 tablet (5 mg total) by mouth 2 (two) times daily.   atorvastatin  (LIPITOR ) 80 MG tablet Take 1 tablet (80 mg total) by mouth daily.   clopidogrel  (PLAVIX ) 75 MG tablet Take 1 tablet (75 mg total) by mouth daily with breakfast.   Faricimab -svoa (VABYSMO   IZ) 1 Dose by Intravitreal route every 6 (six) weeks.   HYDROcodone -acetaminophen  (NORCO/VICODIN) 5-325 MG tablet Take 1 tablet by mouth every 6 (six) hours as needed for moderate pain (pain score 4-6).   lisinopril  (ZESTRIL ) 20 MG tablet TAKE 1 TABLET BY MOUTH DAILY   metoprolol  (TOPROL -XL) 200 MG 24 hr tablet TAKE 1 TABLET BY MOUTH DAILY   nitroGLYCERIN  (NITRODUR - DOSED IN MG/24 HR) 0.2 mg/hr patch Place 1 patch (0.2 mg total) onto the skin daily.   ondansetron  (ZOFRAN -ODT) 4 MG disintegrating tablet Take 1 tablet (4 mg total) by mouth every 8 (eight) hours as needed for nausea or vomiting.   traMADol  (ULTRAM ) 50 MG tablet Take 1 tablet (50 mg total) by mouth every 8 (eight) hours as needed.    Allergies:   Prednisone and Statins   Social History   Socioeconomic History   Marital status: Widowed    Spouse name: Not on file   Number of children: Not on file   Years of education: Not on file   Highest education level: Not on file  Occupational History   Not on file  Tobacco Use   Smoking status: Former    Current packs/day: 0.00    Types: Cigarettes    Quit date:  11/07/2008    Years since quitting: 15.8   Smokeless tobacco: Never  Vaping Use   Vaping status: Never Used  Substance and Sexual Activity   Alcohol  use: No   Drug use: No   Sexual activity: Not on file  Other Topics Concern   Not on file  Social History Narrative   Retired haematologist, 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    Social Drivers of Corporate Investment Banker Strain: Low Risk  (04/29/2024)   Overall Financial Resource Strain (CARDIA)    Difficulty of Paying Living Expenses: Not hard at all  Food Insecurity: No Food Insecurity (08/23/2024)   Hunger Vital Sign    Worried About Running Out of Food in the Last Year: Never true    Ran Out of Food in the Last Year: Never true  Transportation Needs: No Transportation Needs (08/23/2024)   PRAPARE - Administrator, Civil Service (Medical): No    Lack of Transportation (Non-Medical): No  Physical Activity: Insufficiently Active (04/29/2024)   Exercise Vital Sign    Days of Exercise per Week: 7 days    Minutes of Exercise per Session: 20 min  Stress: No Stress Concern Present (04/29/2024)   [employer] of Occupational Health - Occupational Stress Questionnaire    Feeling of Stress: Not at all  Social Connections: Moderately Isolated (08/21/2024)   Social Connection and Isolation Panel    Frequency of Communication with Friends and Family: More than three times a week    Frequency of Social Gatherings with Friends and Family: More than three times a week    Attends Religious Services: More than 4 times per year    Active Member of [employer] or Organizations: No    Attends Banker Meetings: Never    Marital Status: Widowed    Family History: The patient's family history includes Colon polyps (age of onset: 30) in her mother; Heart attack in her father. There is no history of Stroke, Colon cancer, Esophageal cancer, Stomach cancer, or Rectal cancer.  ROS:   Please see the history of present illness.      All other systems reviewed and are negative.  EKGs/Labs/Other Studies Reviewed:    The following  studies were reviewed today: EKG Interpretation Date/Time:  Thursday September 05 2024 13:34:40 EDT Ventricular Rate:  68 PR Interval:    QRS Duration:  76 QT Interval:  424 QTC Calculation: 450 R Axis:   17  Text Interpretation: Atrial fibrillation  When compared with ECG of 17-Jun-2024 11:27, Atrial fibrillation has replaced Sinus rhythm  Confirmed by NATALIA BIRMINGHAM ([id number]) on 09/05/2024 2:06:53 PM    Recent Labs: 07/06/2024: ALT 13 08/21/2024: BUN 14; Creatinine, Ser 1.58; Hemoglobin 10.2; Magnesium  1.3; Platelets 229; Potassium 4.2; Sodium 138; TSH 1.187  Recent Lipid Panel    Component Value Date/Time   CHOL 129 07/10/2024 0431   TRIG 123 07/10/2024 0431   HDL 41 07/10/2024 0431   CHOLHDL 3.1 07/10/2024 0431   VLDL 25 07/10/2024 0431   LDLCALC 63 07/10/2024 0431   LDLDIRECT 163.0 09/13/2021 1523   Risk Assessment/Calculations:    CHA2DS2-VASc Score = 5   This indicates a 7.2% annual risk of stroke. The patient's score is based upon: CHF History: 0 HTN History: 1 Diabetes History: 0 Stroke History: 0 Vascular Disease History: 1 Age Score: 2 Gender Score: 1            Physical Exam:    VS:  BP (!) 114/52   Pulse 68   Ht 5' (1.524 m)   Wt 145 lb (65.8 kg)   SpO2 98%   BMI 28.32 kg/m     Wt Readings from Last 3 Encounters:  09/05/24 145 lb (65.8 kg)  08/21/24 149 lb (67.6 kg)  07/12/24 147 lb 14.9 oz (67.1 kg)    GEN:  Well nourished, well developed in no acute distress HEENT: Normal NECK: No JVD; No carotid bruits LYMPHATICS: No lymphadenopathy CARDIAC: iRRR, no murmurs, rubs, gallops RESPIRATORY:  Clear to auscultation without rales, wheezing or rhonchi  ABDOMEN: Soft, non-tender, non-distended MUSCULOSKELETAL:  No edema; No deformity, post-op boot on left foot SKIN: Warm and dry NEUROLOGIC:  Alert and oriented x 3 PSYCHIATRIC:  Normal affect    ASSESSMENT:    1. Paroxysmal atrial fibrillation (HCC)   2. Essential hypertension   3. Medication management   4. Mild mitral regurgitation   5. Moderate tricuspid regurgitation    PLAN:    Atrial fibrillation Home meds: Toprol  200 mg daily, Eliquis 5 mg BID Noted to be in rate controlled A-Fib that began intra-op during left neck resection 08/2024  Remained rate controlled during admission Currently in rate controlled A-Fib, HR 60s  Echo during admission showed severe LAE, LVEF 55-60% She remains primarily asymptomatic, reports mild fatigue but is unsure if it is related to all of her recent health issues  Magnesium  was low when last check, supplemented in hospital, follow up lab not checked TSH normal  Continue Eliquis 5 mg BID Continue Toprol  200 mg daily Recheck BMET, Mag, CBC today  Discussed options for possible DCCV in future, patient is meeting with radiation oncology soon and suspects 5-6 weeks of radiation therapy needed and will make follow up with cardiology after completed to check on status on A-Fib and discuss possible rhythm control in the future  Hypertension  Home meds: Toprol  200 mg daily, amlodipine  10 mg daily, lisinopril  20 mg daily  BP today: 114/52, reports SBP 110-120s at home Continue with home medications as above   Mild MR Mild to moderate TR Noted on echo 08/2024 Asymptomatic  Follow clinically with serial echo  Follow up: 2 months with Dr. Jordan or APP      Medication  Adjustments/Labs and Tests Ordered: Current medicines are reviewed at length with the patient today.  Concerns regarding medicines are outlined above.  Orders Placed This Encounter  Procedures   CBC   Magnesium    Basic Metabolic Panel (BMET)   EKG 12-Lead   No orders of the defined types were placed in this encounter.   Patient Instructions  Medication Instructions:  Your physician recommends that you continue on your current medications as directed. Please refer  to the Current Medication list given to you today.  *If you need a refill on your cardiac medications before your next appointment, please call your pharmacy*  Lab Work: BMET, Mag, CBC today If you have labs (blood work) drawn today and your tests are completely normal, you will receive your results only by: MyChart Message (if you have MyChart) OR A paper copy in the mail If you have any lab test that is abnormal or we need to change your treatment, we will call you to review the results.  Testing/Procedures: NONE  Follow-Up: At [facility], you and your health needs are our priority.  As part of our continuing mission to provide you with exceptional heart care, our providers are all part of one team.  This team includes your primary Cardiologist (physician) and Advanced Practice Providers or APPs (Physician Assistants and Nurse Practitioners) who all work together to provide you with the care you need, when you need it.  Your next appointment:   2 month(s)  Provider:   Peter Jordan, MD or APP             Signed, Waddell DELENA Donath, PA-C  09/05/2024 2:11 PM     [facility]

## 2024-09-05 ENCOUNTER — Encounter: Payer: Self-pay | Admitting: Cardiology

## 2024-09-05 ENCOUNTER — Ambulatory Visit: Attending: Internal Medicine | Admitting: Cardiology

## 2024-09-05 VITALS — BP 114/52 | HR 68 | Ht 60.0 in | Wt 145.0 lb

## 2024-09-05 DIAGNOSIS — I480 Paroxysmal atrial fibrillation: Secondary | ICD-10-CM | POA: Diagnosis not present

## 2024-09-05 DIAGNOSIS — I340 Nonrheumatic mitral (valve) insufficiency: Secondary | ICD-10-CM | POA: Diagnosis not present

## 2024-09-05 DIAGNOSIS — I071 Rheumatic tricuspid insufficiency: Secondary | ICD-10-CM

## 2024-09-05 DIAGNOSIS — Z79899 Other long term (current) drug therapy: Secondary | ICD-10-CM | POA: Diagnosis not present

## 2024-09-05 DIAGNOSIS — I10 Essential (primary) hypertension: Secondary | ICD-10-CM | POA: Diagnosis not present

## 2024-09-05 NOTE — Patient Instructions (Signed)
 Medication Instructions:  Your physician recommends that you continue on your current medications as directed. Please refer to the Current Medication list given to you today.  *If you need a refill on your cardiac medications before your next appointment, please call your pharmacy*  Lab Work: BMET, Mag, CBC today If you have labs (blood work) drawn today and your tests are completely normal, you will receive your results only by: MyChart Message (if you have MyChart) OR A paper copy in the mail If you have any lab test that is abnormal or we need to change your treatment, we will call you to review the results.  Testing/Procedures: NONE  Follow-Up: At [facility], you and your health needs are our priority.  As part of our continuing mission to provide you with exceptional heart care, our providers are all part of one team.  This team includes your primary Cardiologist (physician) and Advanced Practice Providers or APPs (Physician Assistants and Nurse Practitioners) who all work together to provide you with the care you need, when you need it.  Your next appointment:   2 month(s)  Provider:   Peter Jordan, MD or APP

## 2024-09-06 ENCOUNTER — Telehealth: Payer: Self-pay | Admitting: Orthopedic Surgery

## 2024-09-06 ENCOUNTER — Telehealth: Payer: Self-pay | Admitting: Cardiology

## 2024-09-06 ENCOUNTER — Ambulatory Visit: Payer: Self-pay | Admitting: Cardiology

## 2024-09-06 LAB — BASIC METABOLIC PANEL WITH GFR
BUN/Creatinine Ratio: 9 — ABNORMAL LOW (ref 12–28)
BUN: 17 mg/dL (ref 8–27)
CO2: 20 mmol/L (ref 20–29)
Calcium: 8.1 mg/dL — ABNORMAL LOW (ref 8.7–10.3)
Chloride: 100 mmol/L (ref 96–106)
Creatinine, Ser: 1.9 mg/dL — ABNORMAL HIGH (ref 0.57–1.00)
Glucose: 100 mg/dL — ABNORMAL HIGH (ref 70–99)
Potassium: 4.3 mmol/L (ref 3.5–5.2)
Sodium: 140 mmol/L (ref 134–144)
eGFR: 27 mL/min/1.73 — ABNORMAL LOW (ref >59)

## 2024-09-06 LAB — CBC
Hematocrit: 28.6 % — ABNORMAL LOW (ref 34.0–46.6)
Hemoglobin: 9.2 g/dL — ABNORMAL LOW (ref 11.1–15.9)
MCH: 30.5 pg (ref 26.6–33.0)
MCHC: 32.2 g/dL (ref 31.5–35.7)
MCV: 95 fL (ref 79–97)
Platelets: 302 x10E3/uL (ref 150–450)
RBC: 3.02 x10E6/uL — ABNORMAL LOW (ref 3.77–5.28)
RDW: 13.4 % (ref 11.7–15.4)
WBC: 11.1 x10E3/uL — ABNORMAL HIGH (ref 3.4–10.8)

## 2024-09-06 LAB — MAGNESIUM: Magnesium: 1.1 mg/dL — ABNORMAL LOW (ref 1.6–2.3)

## 2024-09-06 NOTE — Telephone Encounter (Signed)
 CALLED PT LEFT VM PROVIDER OUT OF OFFICE NEED TO R/S/ with Harden.Rocky Peters

## 2024-09-06 NOTE — Telephone Encounter (Signed)
   Labs from yesterdays appointment resulted and showed magnesium  of 1.1, creatinine elevated at 1.90 and hemoglobin dropped to 9.2. I talked with her and she said that she is still feeling fatigued but her symptoms have not changed much. I instructed her to go to either [facility] or [facility] ED to get replacement, possible IV fluids and recheck labs. She tells me that she has to wait for her son to get home before she goes. I provided her with the addresses for these locations. She expressed understanding. I instructed her to follow up with her PCP as there are not many meds that would be causing the depletion. She tells me she will reach out to their office. I instructed her to call us  back if she had any other questions.  Waddell Taylor Donath, PA-C 09/06/2024 4:11 PM

## 2024-09-08 ENCOUNTER — Emergency Department (HOSPITAL_BASED_OUTPATIENT_CLINIC_OR_DEPARTMENT_OTHER)
Admission: EM | Admit: 2024-09-08 | Discharge: 2024-09-08 | Disposition: A | Discharge status: Home / Self Care | Source: Ambulatory Visit | Attending: Emergency Medicine | Admitting: Emergency Medicine

## 2024-09-08 ENCOUNTER — Encounter (HOSPITAL_BASED_OUTPATIENT_CLINIC_OR_DEPARTMENT_OTHER): Payer: Self-pay

## 2024-09-08 ENCOUNTER — Other Ambulatory Visit: Payer: Self-pay

## 2024-09-08 DIAGNOSIS — Z7902 Long term (current) use of antithrombotics/antiplatelets: Secondary | ICD-10-CM | POA: Insufficient documentation

## 2024-09-08 DIAGNOSIS — Z7901 Long term (current) use of anticoagulants: Secondary | ICD-10-CM | POA: Diagnosis not present

## 2024-09-08 DIAGNOSIS — I482 Chronic atrial fibrillation, unspecified: Secondary | ICD-10-CM | POA: Insufficient documentation

## 2024-09-08 DIAGNOSIS — N189 Chronic kidney disease, unspecified: Secondary | ICD-10-CM | POA: Insufficient documentation

## 2024-09-08 DIAGNOSIS — R799 Abnormal finding of blood chemistry, unspecified: Secondary | ICD-10-CM | POA: Diagnosis present

## 2024-09-08 LAB — CBC WITH DIFFERENTIAL/PLATELET
Abs Immature Granulocytes: 0.03 K/uL (ref 0.00–0.07)
Basophils Absolute: 0.1 K/uL (ref 0.0–0.1)
Basophils Relative: 1 %
Eosinophils Absolute: 0.3 K/uL (ref 0.0–0.5)
Eosinophils Relative: 4 %
HCT: 29.2 % — ABNORMAL LOW (ref 36.0–46.0)
Hemoglobin: 9.3 g/dL — ABNORMAL LOW (ref 12.0–15.0)
Immature Granulocytes: 0 %
Lymphocytes Relative: 16 %
Lymphs Abs: 1.3 K/uL (ref 0.7–4.0)
MCH: 30 pg (ref 26.0–34.0)
MCHC: 31.8 g/dL (ref 30.0–36.0)
MCV: 94.2 fL (ref 80.0–100.0)
Monocytes Absolute: 0.7 K/uL (ref 0.1–1.0)
Monocytes Relative: 8 %
Neutro Abs: 6.1 K/uL (ref 1.7–7.7)
Neutrophils Relative %: 71 %
Platelets: 297 K/uL (ref 150–400)
RBC: 3.1 MIL/uL — ABNORMAL LOW (ref 3.87–5.11)
RDW: 14.8 % (ref 11.5–15.5)
WBC: 8.5 K/uL (ref 4.0–10.5)
nRBC: 0 % (ref 0.0–0.2)

## 2024-09-08 LAB — BASIC METABOLIC PANEL WITH GFR
Anion gap: 11 (ref 5–15)
BUN: 14 mg/dL (ref 8–23)
CO2: 25 mmol/L (ref 22–32)
Calcium: 8.6 mg/dL — ABNORMAL LOW (ref 8.9–10.3)
Chloride: 103 mmol/L (ref 98–111)
Creatinine, Ser: 1.52 mg/dL — ABNORMAL HIGH (ref 0.44–1.00)
GFR, Estimated: 35 mL/min — ABNORMAL LOW (ref >60)
Glucose, Bld: 100 mg/dL — ABNORMAL HIGH (ref 70–99)
Potassium: 3.8 mmol/L (ref 3.5–5.1)
Sodium: 139 mmol/L (ref 135–145)

## 2024-09-08 LAB — MAGNESIUM: Magnesium: 1.2 mg/dL — ABNORMAL LOW (ref 1.7–2.4)

## 2024-09-08 MED ORDER — MAGNESIUM SULFATE 2 GM/50ML IV SOLN
2.0000 g | Freq: Once | INTRAVENOUS | Status: AC
  Administered 2024-09-08: 2 g via INTRAVENOUS
  Filled 2024-09-08: qty 50

## 2024-09-08 NOTE — ED Triage Notes (Signed)
 Patient arrives after having lab work showing a Mg of 1.1 on the 31st. Was urged by cardiology to come here for Mg replacement.

## 2024-09-08 NOTE — ED Provider Notes (Signed)
 [place] EMERGENCY DEPARTMENT AT [facility] Provider Note   CSN: [id number] Arrival date & time: 09/08/24  9167     Patient presents with: Abnormal Labs   Taylor Bush is a 78 y.o. female.   78 year old female presents requesting blood work.  Old records reviewed and patient seen by her doctor few days ago and diagnosed with low magnesium .  Also had worsening of her chronic kidney disease.  Patient still denies any volume loss.  States she that she does feel slightly weak but otherwise feels okay.  Patient has a history of chronic A-fib and is on Eliquis and has been compliant.  She denies any chest pain or shortness of breath.  No new focal weakness.  Was instructed to come here for repeat labs with possible magnesium  supplementation.       Prior to Admission medications   Medication Sig Start Date End Date Taking? Authorizing Provider  amLODipine  (NORVASC ) 10 MG tablet TAKE 1 TABLET BY MOUTH DAILY 08/05/24   Copland, Jacques, MD  apixaban (ELIQUIS) 5 MG TABS tablet Take 1 tablet (5 mg total) by mouth 2 (two) times daily. 08/24/24   Henry Manuelita NOVAK, NP  atorvastatin  (LIPITOR ) 80 MG tablet Take 1 tablet (80 mg total) by mouth daily. 07/14/24   Akula, Vijaya, MD  clopidogrel  (PLAVIX ) 75 MG tablet Take 1 tablet (75 mg total) by mouth daily with breakfast. 07/14/24   Cherlyn Labella, MD  Faricimab -svoa (VABYSMO  IZ) 1 Dose by Intravitreal route every 6 (six) weeks.    [provider]  HYDROcodone -acetaminophen  (NORCO/VICODIN) 5-325 MG tablet Take 1 tablet by mouth every 6 (six) hours as needed for moderate pain (pain score 4-6). 08/22/24   Jesus Oliphant, MD  lisinopril  (ZESTRIL ) 20 MG tablet TAKE 1 TABLET BY MOUTH DAILY 08/05/24   Copland, Jacques, MD  metoprolol  (TOPROL -XL) 200 MG 24 hr tablet TAKE 1 TABLET BY MOUTH DAILY 08/05/24   Copland, Jacques, MD  mupirocin  ointment (BACTROBAN ) 2 % Place 1 Application into the nose 2 (two) times daily. Patient not taking: Reported on  09/05/2024 07/13/24   Akula, Vijaya, MD  nitroGLYCERIN  (NITRODUR - DOSED IN MG/24 HR) 0.2 mg/hr patch Place 1 patch (0.2 mg total) onto the skin daily. 07/23/24   [name], PA-C  ondansetron  (ZOFRAN -ODT) 4 MG disintegrating tablet Take 1 tablet (4 mg total) by mouth every 8 (eight) hours as needed for nausea or vomiting. Patient not taking: Reported on 09/05/2024 06/18/24   Jesus Oliphant, MD  ondansetron  (ZOFRAN -ODT) 4 MG disintegrating tablet Take 1 tablet (4 mg total) by mouth every 8 (eight) hours as needed for nausea or vomiting. 08/22/24   Jesus Oliphant, MD  senna-docusate (SENOKOT-S) 8.6-50 MG tablet Take 1 tablet by mouth at bedtime as needed for mild constipation. Patient not taking: Reported on 09/05/2024 07/13/24   Akula, Vijaya, MD  traMADol  (ULTRAM ) 50 MG tablet Take 1 tablet (50 mg total) by mouth every 8 (eight) hours as needed. 08/24/24   Copland, Jacques, MD    Allergies: Prednisone and Statins    Review of Systems  All other systems reviewed and are negative.   Updated Vital Signs BP 128/66 (BP Location: Right Arm)   Pulse 71   Temp 97.6 F (36.4 C) (Oral)   Resp 16   SpO2 100%   Physical Exam Vitals and nursing note reviewed.  Constitutional:      General: She is not in acute distress.    Appearance: Normal appearance. She is well-developed. She is  not toxic-appearing.  HENT:     Head: Normocephalic and atraumatic.  Eyes:     General: Lids are normal.     Conjunctiva/sclera: Conjunctivae normal.     Pupils: Pupils are equal, round, and reactive to light.  Neck:     Thyroid : No thyroid  mass.     Trachea: No tracheal deviation.  Cardiovascular:     Rate and Rhythm: Normal rate and regular rhythm.     Heart sounds: Normal heart sounds. No murmur heard.    No gallop.  Pulmonary:     Effort: Pulmonary effort is normal. No respiratory distress.     Breath sounds: Normal breath sounds. No stridor. No decreased breath sounds, wheezing, rhonchi or rales.   Abdominal:     General: There is no distension.     Palpations: Abdomen is soft.     Tenderness: There is no abdominal tenderness. There is no rebound.  Musculoskeletal:        General: No tenderness. Normal range of motion.     Cervical back: Normal range of motion and neck supple.  Skin:    General: Skin is warm and dry.     Findings: No abrasion or rash.  Neurological:     Mental Status: She is alert and oriented to person, place, and time. Mental status is at baseline.     GCS: GCS eye subscore is 4. GCS verbal subscore is 5. GCS motor subscore is 6.     Cranial Nerves: No cranial nerve deficit.     Sensory: No sensory deficit.     Motor: Motor function is intact.  Psychiatric:        Attention and Perception: Attention normal.        Speech: Speech normal.        Behavior: Behavior normal.     (all labs ordered are listed, but only abnormal results are displayed) Labs Reviewed  MAGNESIUM   BASIC METABOLIC PANEL WITH GFR  CBC WITH DIFFERENTIAL/PLATELET    EKG: EKG Interpretation Date/Time:  Sunday September 08 2024 08:49:47 EST Ventricular Rate:  69 PR Interval:    QRS Duration:  82 QT Interval:  425 QTC Calculation: 456 R Axis:   40  Text Interpretation: Atrial fibrillation Borderline repolarization abnormality No significant change since last tracing Confirmed by Dasie Faden ([id number]) on 09/08/2024 8:58:31 AM  Radiology: No results found.   Procedures   Medications Ordered in the ED - No data to display                                  Medical Decision Making Amount and/or Complexity of Data Reviewed Labs: ordered.  Risk Prescription drug management.   Patient's magnesium  was 1.2 here.  Her potassium was normal.  Given 2 g magnesium  here.  Renal function exactly improved from a few days ago.  Hemoglobin is stable.  Will discharge her home to follow-up with her cardiologist     Final diagnoses:  None    ED Discharge Orders     None           Dasie Faden, MD 09/08/24 1042

## 2024-09-08 NOTE — Discharge Instructions (Signed)
 Follow-up with your doctor next week to have a repeat magnesium  level

## 2024-09-09 ENCOUNTER — Encounter: Payer: Self-pay | Admitting: Radiology

## 2024-09-09 ENCOUNTER — Other Ambulatory Visit: Payer: Self-pay

## 2024-09-09 DIAGNOSIS — C029 Malignant neoplasm of tongue, unspecified: Secondary | ICD-10-CM

## 2024-09-09 NOTE — Progress Notes (Signed)
 Oncology Nurse Navigator Documentation   Placed introductory call to new referral patient Taylor Bush.  Introduced myself as the H&N oncology nurse navigator that works with Dr. Izell to whom she has been referred by Dr. Jesus. She confirmed understanding of referral. Briefly explained my role as her navigator, provided my contact information.  I explained that she will need a CT of her neck/chest/abdomen before being scheduled for radiation consult. I will notify her of the appointments when they are scheduled.  I explained the purpose of a dental evaluation prior to starting RT, indicated she would be contacted by Dr. Nila office with [facility].  I encouraged her to call with questions/concerns as she moves forward with appts and procedures.   She verbalized understanding of information provided, expressed appreciation for my call.   Navigator Initial Assessment Employment Status: she is retired  Currently on [employer]UM[employer] / STD: na Living Situation: she lives with her son in [place] Support System: family PCP: Jacques Schroeder MD PCD: yes, referral placed to Dr. Danial Financial Concerns:no Transportation Needs: no Sensory Deficits: no Language Barriers/Interpreter Needed:  no Ambulation Needs: no Psychosocial Needs:  no Concerns/Needs Understanding Cancer:  addressed/answered by navigator to best of ability Self-Expressed Needs: no   Delon Jefferson RN, BSN, OCN Head & Neck Oncology Nurse Navigator [facility] Phone # [phone]  Fax # [phone]

## 2024-09-11 LAB — SURGICAL PATHOLOGY

## 2024-09-11 NOTE — Progress Notes (Signed)
 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 []  [x]  Weight has stayed stable  Swallowing concerns? []  [x]  No issues swallowing  PEG? []  [x]     Referrals Yes No Comments  Social Work? [x]  []    Dentistry? [x]  []    Swallowing therapy? [x]  []    Nutrition? [x]  []    Med/Onc? [x]  []     Safety Issues Yes No Comments  Prior radiation? []  [x]    Pacemaker/ICD? []  [x]    Possible current pregnancy? []  [x]    Is the patient on methotrexate? []  [x]     Tobacco/Marijuana/Snuff/ETOH use: Patient quit smoking 15 years ago. She does not drink alcohol  and does not use drugs,  Past/Anticipated interventions by otolaryngology, if any:  08/21/2024 Left Radical Neck Dissection   Past/Anticipated interventions by medical oncology, if any:    Current Complaints / other details:   None

## 2024-09-13 ENCOUNTER — Telehealth: Payer: Self-pay | Admitting: Oncology

## 2024-09-13 NOTE — Telephone Encounter (Signed)
 I spoke with patient regarding appointments. Patients aware of date/time.

## 2024-09-18 ENCOUNTER — Ambulatory Visit (HOSPITAL_COMMUNITY)
Admission: RE | Admit: 2024-09-18 | Discharge: 2024-09-18 | Disposition: A | Discharge status: Home / Self Care | Source: Ambulatory Visit | Attending: Radiation Oncology | Admitting: Radiation Oncology

## 2024-09-18 DIAGNOSIS — M4802 Spinal stenosis, cervical region: Secondary | ICD-10-CM | POA: Insufficient documentation

## 2024-09-18 DIAGNOSIS — C029 Malignant neoplasm of tongue, unspecified: Secondary | ICD-10-CM | POA: Diagnosis present

## 2024-09-18 DIAGNOSIS — R911 Solitary pulmonary nodule: Secondary | ICD-10-CM | POA: Insufficient documentation

## 2024-09-18 DIAGNOSIS — I6523 Occlusion and stenosis of bilateral carotid arteries: Secondary | ICD-10-CM | POA: Diagnosis not present

## 2024-09-18 DIAGNOSIS — K5730 Diverticulosis of large intestine without perforation or abscess without bleeding: Secondary | ICD-10-CM | POA: Diagnosis not present

## 2024-09-18 DIAGNOSIS — I700 Atherosclerosis of aorta: Secondary | ICD-10-CM | POA: Insufficient documentation

## 2024-09-18 DIAGNOSIS — J439 Emphysema, unspecified: Secondary | ICD-10-CM | POA: Diagnosis not present

## 2024-09-18 DIAGNOSIS — Z9889 Other specified postprocedural states: Secondary | ICD-10-CM | POA: Diagnosis not present

## 2024-09-18 DIAGNOSIS — M47812 Spondylosis without myelopathy or radiculopathy, cervical region: Secondary | ICD-10-CM | POA: Diagnosis not present

## 2024-09-18 DIAGNOSIS — R590 Localized enlarged lymph nodes: Secondary | ICD-10-CM | POA: Insufficient documentation

## 2024-09-18 DIAGNOSIS — J9811 Atelectasis: Secondary | ICD-10-CM | POA: Insufficient documentation

## 2024-09-18 DIAGNOSIS — J90 Pleural effusion, not elsewhere classified: Secondary | ICD-10-CM | POA: Insufficient documentation

## 2024-09-18 MED ORDER — IOHEXOL 300 MG/ML  SOLN
80.0000 mL | Freq: Once | INTRAMUSCULAR | Status: AC | PRN
  Administered 2024-09-18: 80 mL via INTRAVENOUS

## 2024-09-18 MED ORDER — SODIUM CHLORIDE (PF) 0.9 % IJ SOLN
INTRAMUSCULAR | Status: AC
  Filled 2024-09-18: qty 50

## 2024-09-19 NOTE — Progress Notes (Signed)
 Radiation Oncology         [phone] ________________________________  Initial Outpatient Consultation  Name: Taylor Bush MRN: [id number]  Date: 09/20/2024  DOB: 1946/08/16  RR:Rneojwi, Jacques, MD  Jesus Oliphant, MD   REFERRING PHYSICIAN: Jesus Oliphant, MD  DIAGNOSIS: No diagnosis found.   Cancer Staging  No matching staging information was found for the patient.  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 female with a history of recurrent oral leukoplakia followed by Dr. Jesus since at least 2016. She also had a medical history notable for stage 3 CKD, chronic a-fib, and PAD.  Upon record review, she presented to Dr. Jesus on 08/14/23 for evaluation of new white spots on the posterior left lateral aspect of the tongue. Recurrent oral leukoplakia was suspected and laser ablation and biopsies under anesthesia were recommended (which she had undergone in the past in the same setting). Biopsies of the left tongue were promptly obtained on 10/02/23 and showed moderate squamous dysplasia without evidence of carcinoma.   She remained without issues to her tongue in the following months but later presented to Dr. Jesus on 06/13/24 with c/o worsening irritation to the left side of her tongue over the past several months. Oral exam performed at that time noted a 3 cm raised ulcerative lesion with surrounding leukoplakia to the left posterior lateral tongue.   Based on Dr. Godfrey recommendations, she opted to proceed with a left partial glossectomy on 06/17/24. Pathology from the procedure revealed: tumor the size of 1.9 cm; histology of invasive well to moderately  differentiated squamous cell carcinoma invading a depth of 0.9 cm, along with adjacent high-grade squamous dysplasia; p16 negative; angiolymphatic invasion present; all margins negative for dysplasia or carcinoma. No lymph nodes were excised.   Based on the depth of invasion, she underwent left modified neck dissection on 08/21/24. Pathology from the procedure revealed: 03/33 lymph nodes positive for metastatic SCC; the largest lymph node involved was a level II lymph node measuring 1.9 cm. The same large level II lymph node was also positive for focal extracapsular extension; with a distance of less than 2 Bush from the lymph node capsule (microscopic extranodal extension).   Her post-op course was uncomplicated per her subsequent follow-up examinations by Dr. Jesus. Radiation therapy has been recommended which we will discuss in detail together today. Dr. Jesus also referred her to medical oncology and she was seen in consultation by Dr. Autumn earlier this morning.   Pertinent imaging performed thus far includes:  -- Soft tissue neck CT with contrast on 09/18/24 which demonstrated: a prominent heterogeneous left level 3/4 cervical lymph node measuring 1.0 x 1.2 cm with central hypoattenuation concerning for possible central necrosis; subtle asymmetric soft tissue at the left posterior tongue (likely related to recent surgical changes); and postsurgical changes of the left neck with presumed surgical absence of the left submandibular gland.  -- CT of the abdomen on 09/18/24 showed no evidence of metastatic disease in the abdomen or acute findings overall.   Of note: She was recently seen in the ED on 09/08/24 for evaluation of hypomagnesemia. Labs in the ED showed magnesium  of 1.2 but were otherwise unremarkable. She was given 2g of magnesium  and discharged  home in stable condition.   Swallowing issues, if any: ***  Weight Changes: ***  Pain status: ***  Other symptoms: ***  Tobacco history, if  any: former smoker; quit approximately 15 years ago   ETOH abuse, if any: none  Prior cancers, if any: none  PREVIOUS RADIATION THERAPY: No  PAST MEDICAL HISTORY:  has a past medical history of Allergic rhinitis, Allergy, Anemia in chronic kidney disease (CKD), Arthritis, B12 deficiency (09/13/2021), Blood transfusion (2012), Blood transfusion without reported diagnosis, Cataract, Chronic low back pain, CKD (chronic kidney disease) stage 3, GFR 30-59 ml/min (HCC) (11/29/2013), HLD (hyperlipidemia), HTN (hypertension), total hip arthroplasty (12/15/2011), Open-angle glaucoma, and PAD (peripheral artery disease).    PAST SURGICAL HISTORY: Past Surgical History:  Procedure Laterality Date   ABDOMINAL AORTOGRAM W/LOWER EXTREMITY N/A 07/09/2024   Procedure: ABDOMINAL AORTOGRAM W/LOWER EXTREMITY;  Surgeon: Serene Gaile ORN, MD;  Location: [facility];  Service: Cardiovascular;  Laterality: N/A;   abnormal vascular studies  05/07/2009   ABI's 70, R SFA > 50%^ blockage    APPLICATION OF WOUND VAC Left 07/12/2024   Procedure: APPLICATION, WOUND VAC;  Surgeon: Harden Jerona GAILS, MD;  Location: [facility];  Service: Orthopedics;  Laterality: Left;   CATARACT EXTRACTION, BILATERAL  2011   EXCISION OF TONGUE LESION WITH LASER N/A 08/31/2015   Procedure: BIOPSY AND CO2 LASER OF TONGUE LESION ;  Surgeon: Ida Loader, MD;  Location: [facility];  Service: ENT;  Laterality: N/A;   EXCISION OF TONGUE LESION WITH LASER Left 06/29/2020   Procedure: EXCISION OF TONGUE LESION/CO2 laser w/biopsy;  Surgeon: Loader Ida, MD;  Location: [facility];  Service: ENT;  Laterality: Left;   EXCISION OF TONGUE LESION WITH LASER Bilateral 10/02/2023   Procedure: ORAL BIOPSY AND CO2 LASER ABLATATION OF TONGUE LESION;  Surgeon: Loader Ida, MD;  Location: [facility];  Service: ENT;  Laterality: Bilateral;   EXCISION ORAL LESION WITH CO2 LASER N/A 12/08/2021   Procedure: EXCISION OF ORAL ORAL CAVITY LESION  WITH CO2 LASER;  Surgeon: Loader Ida, MD;  Location: [facility];  Service: ENT;  Laterality: N/A;   EYE SURGERY     GLOSSECTOMY, PARTIAL Left 06/17/2024   Procedure: GLOSSECTOMY, PARTIAL;  Surgeon: Loader Ida, MD;  Location: [facility] OR;  Service: ENT;  Laterality: Left;   INCISION AND DRAINAGE OF WOUND Left 07/12/2024   Procedure: IRRIGATION AND DEBRIDEMENT WOUND;  Surgeon: Harden Jerona GAILS, MD;  Location: [facility];  Service: Orthopedics;  Laterality: Left;  LEFT FOOT DEBRIDEMENT   JOINT REPLACEMENT     LOWER EXTREMITY ANGIOGRAPHY  07/09/2024   Procedure: Lower Extremity Angiography;  Surgeon: Serene Gaile ORN, MD;  Location: [facility];  Service: Cardiovascular;;   LOWER EXTREMITY INTERVENTION  07/09/2024   Procedure: LOWER EXTREMITY INTERVENTION;  Surgeon: Serene Gaile ORN, MD;  Location: [facility];  Service: Cardiovascular;;   OTHER SURGICAL HISTORY     ganglion cyst removed left wrist    PERIPHERAL INTRAVASCULAR LITHOTRIPSY  07/09/2024   Procedure: PERIPHERAL INTRAVASCULAR LITHOTRIPSY;  Surgeon: Serene Gaile ORN, MD;  Location: [facility];  Service: Cardiovascular;;   PERIPHERAL VASCULAR ATHERECTOMY  07/09/2024   Procedure: PERIPHERAL VASCULAR ATHERECTOMY;  Surgeon: Serene Gaile ORN, MD;  Location: [facility];  Service: Cardiovascular;;   RADICAL NECK DISSECTION Left 08/21/2024   Procedure: DISSECTION, NECK, RADICAL;  Surgeon: Loader Ida, MD;  Location: [facility];  Service: ENT;  Laterality: Left;   TOTAL  HIP ARTHROPLASTY  10/03/2011   Procedure: TOTAL HIP ARTHROPLASTY;  Surgeon: Dempsey GAILS Aluisio;  Location: [facility];  Service: Orthopedics;  Laterality: Right;   TOTAL HIP ARTHROPLASTY  12/21/2011   Procedure: TOTAL HIP ARTHROPLASTY;  Surgeon: Dempsey GAILS Moan, MD;  Location: [facility];  Service: Orthopedics;  Laterality: Left;   TUBAL LIGATION      FAMILY HISTORY: family history includes Colon polyps (age of onset: 12) in her mother; Heart attack in her  father.  SOCIAL HISTORY:  reports that she quit smoking about 15 years ago. Her smoking use included cigarettes. She has never used smokeless tobacco. She reports that she does not drink alcohol  and does not use drugs.  ALLERGIES: Prednisone and Statins  MEDICATIONS:  Current Outpatient Medications  Medication Sig Dispense Refill   amLODipine  (NORVASC ) 10 MG tablet TAKE 1 TABLET BY MOUTH DAILY 100 tablet 0   apixaban (ELIQUIS) 5 MG TABS tablet Take 1 tablet (5 mg total) by mouth 2 (two) times daily. 60 tablet 3   atorvastatin  (LIPITOR ) 80 MG tablet Take 1 tablet (80 mg total) by mouth daily. 30 tablet 3   clopidogrel  (PLAVIX ) 75 MG tablet Take 1 tablet (75 mg total) by mouth daily with breakfast. 30 tablet 3   Faricimab -svoa (VABYSMO  IZ) 1 Dose by Intravitreal route every 6 (six) weeks.     HYDROcodone -acetaminophen  (NORCO/VICODIN) 5-325 MG tablet Take 1 tablet by mouth every 6 (six) hours as needed for moderate pain (pain score 4-6). 16 tablet 0   lisinopril  (ZESTRIL ) 20 MG tablet TAKE 1 TABLET BY MOUTH DAILY 100 tablet 0   metoprolol  (TOPROL -XL) 200 MG 24 hr tablet TAKE 1 TABLET BY MOUTH DAILY 100 tablet 0   mupirocin  ointment (BACTROBAN ) 2 % Place 1 Application into the nose 2 (two) times daily. (Patient not taking: Reported on 09/05/2024) 22 g 0   nitroGLYCERIN  (NITRODUR - DOSED IN MG/24 HR) 0.2 mg/hr patch Place 1 patch (0.2 mg total) onto the skin daily. 30 patch 12   ondansetron  (ZOFRAN -ODT) 4 MG disintegrating tablet Take 1 tablet (4 mg total) by mouth every 8 (eight) hours as needed for nausea or vomiting. (Patient not taking: Reported on 09/05/2024) 20 tablet 0   ondansetron  (ZOFRAN -ODT) 4 MG disintegrating tablet Take 1 tablet (4 mg total) by mouth every 8 (eight) hours as needed for nausea or vomiting. 20 tablet 0   senna-docusate (SENOKOT-S) 8.6-50 MG tablet Take 1 tablet by mouth at bedtime as needed for mild constipation. (Patient not taking: Reported on 09/05/2024)     traMADol   (ULTRAM ) 50 MG tablet Take 1 tablet (50 mg total) by mouth every 8 (eight) hours as needed. 40 tablet 2   No current facility-administered medications for this encounter.    REVIEW OF SYSTEMS:  Notable for that above.   PHYSICAL EXAM:  vitals were not taken for this visit.   General: Alert and oriented, in no acute distress HEENT: Head is normocephalic. Extraocular movements are intact. Oropharynx is notable for ***. Neck: Neck is notable for *** Heart: Regular in rate and rhythm with no murmurs, rubs, or gallops. Chest: Clear to auscultation bilaterally, with no rhonchi, wheezes, or rales. Abdomen: Soft, nontender, nondistended, with no rigidity or guarding. Extremities: No cyanosis or edema. Lymphatics: see Neck Exam Skin: No concerning lesions. Musculoskeletal: symmetric strength and muscle tone throughout. Neurologic: Cranial nerves II through XII are grossly intact. No obvious focalities. Speech is fluent. Coordination is intact. Psychiatric: Judgment and insight are intact. Affect is appropriate.  ECOG = ***  0 - Asymptomatic (Fully active, able to carry on all predisease activities without restriction)  1 - Symptomatic but completely ambulatory (Restricted in physically strenuous activity but ambulatory and able to carry out work of a light or sedentary nature. For example, light housework, office work)  2 - Symptomatic, <50% in bed during the day (Ambulatory and capable of all self care but unable to carry out any work activities. Up and about more than 50% of waking hours)  3 - Symptomatic, >50% in bed, but not bedbound (Capable of only limited self-care, confined to bed or chair 50% or more of waking hours)  4 - Bedbound (Completely disabled. Cannot carry on any self-care. Totally confined to bed or chair)  5 - Death   Taylor Bush, Taylor Bush, Taylor Bush, et al. [phone]). Toxicity and response criteria of the [facility]. Am. DOROTHA Bridges. Oncol. 5 (6):  649-55   LABORATORY DATA:  Lab Results  Component Value Date   WBC 8.5 09/08/2024   HGB 9.3 (L) 09/08/2024   HCT 29.2 (L) 09/08/2024   MCV 94.2 09/08/2024   PLT 297 09/08/2024   CMP     Component Value Date/Time   NA 139 09/08/2024 0850   NA 140 09/05/2024 1421   K 3.8 09/08/2024 0850   CL 103 09/08/2024 0850   CO2 25 09/08/2024 0850   GLUCOSE 100 (H) 09/08/2024 0850   BUN 14 09/08/2024 0850   BUN 17 09/05/2024 1421   CREATININE 1.52 (H) 09/08/2024 0850   CALCIUM  8.6 (L) 09/08/2024 0850   PROT 6.1 (L) 07/06/2024 0939   ALBUMIN 3.1 (L) 07/06/2024 0939   AST 17 07/06/2024 0939   ALT 13 07/06/2024 0939   ALKPHOS 53 07/06/2024 0939   BILITOT 0.5 07/06/2024 0939   GFRNONAA 35 (L) 09/08/2024 0850   GFRAA 38 (L) 06/25/2020 1200      Lab Results  Component Value Date   TSH 1.187 08/21/2024     RADIOGRAPHY: CT Chest W Contrast Result Date: 09/19/2024 CLINICAL DATA:  Head and neck cancer. Staging. * Tracking Code: BO * EXAM: CT CHEST WITH CONTRAST TECHNIQUE: Multidetector CT imaging of the chest was performed during intravenous contrast administration. RADIATION DOSE REDUCTION: This exam was performed according to the departmental dose-optimization program which includes automated exposure control, adjustment of the mA and/or kV according to patient size and/or use of iterative reconstruction technique. CONTRAST:  80mL OMNIPAQUE IOHEXOL 300 MG/ML  SOLN COMPARISON:  None Available. FINDINGS: Cardiovascular: The heart is enlarged. No substantial pericardial effusion. Coronary artery calcification is evident. Mild atherosclerotic calcification is noted in the wall of the thoracic aorta. Mediastinum/Nodes: Lower cervical lymphadenopathy (image 7/series 2) characterized on CT neck performed at the same time and reported separately. 12 Bush short axis AP window lymph node identified on 41/2. 12 Bush short axis low right paratracheal lymph node seen on 44/2. There is no hilar lymphadenopathy.  The esophagus has normal imaging features. There is no axillary lymphadenopathy. Lungs/Pleura: Subtle changes of centrilobular emphysema noted. Scattered tiny bilateral pulmonary nodules, many of which are calcified consistent with granulomata. 8 Bush anterior left upper lobe pulmonary nodule seen on 30/9/6 shows mixed attenuation with sub solid features. Dependent atelectasis noted right lower lobe with small right pleural effusion. Upper Abdomen: See abdomen CT report from same day dictated separately. Musculoskeletal: No worrisome lytic or sclerotic osseous abnormality. IMPRESSION: 1. Lower cervical lymphadenopathy characterized on CT neck performed at the same time and reported separately.  2. Mild mediastinal lymphadenopathy. Metastatic involvement not excluded. PET-CT suggested to further evaluate. 3. 8 Bush anterior left upper lobe pulmonary nodule shows mixed attenuation with sub solid features. This nodule warrants close follow-up as metastatic disease or lung primary not excluded. PET-CT may prove helpful. 4. Small right pleural effusion with dependent atelectasis right lower lobe. 5. Aortic Atherosclerosis (ICD10-I70.0) and Emphysema (ICD10-J43.9). Electronically Signed   By: Camellia Candle M.D.   On: 09/19/2024 05:12   CT ABDOMEN W CONTRAST Result Date: 09/18/2024 CLINICAL DATA:  Head and neck cancer staging. EXAM: CT ABDOMEN WITH CONTRAST TECHNIQUE: Multidetector CT imaging of the abdomen was performed using the standard protocol following bolus administration of intravenous contrast. RADIATION DOSE REDUCTION: This exam was performed according to the departmental dose-optimization program which includes automated exposure control, adjustment of the mA and/or kV according to patient size and/or use of iterative reconstruction technique. CONTRAST:  80mL OMNIPAQUE IOHEXOL 300 MG/ML  SOLN COMPARISON:  Chest CT dated 09/18/2024. FINDINGS: Lower chest: See chest CT report. Hepatobiliary: The liver is  unremarkable. No biliary dilatation. The gallbladder is unremarkable. Pancreas: Unremarkable. No pancreatic ductal dilatation or surrounding inflammatory changes. Spleen: Normal in size without focal abnormality. Adrenals/Urinary Tract: The adrenal glands unremarkable. Vascular calcifications versus less likely small nonobstructing bilateral renal calculi. There is no hydronephrosis on either side. The visualized ureters appear unremarkable. Stomach/Bowel: Scattered distal colonic diverticula. No bowel obstruction. Vascular/Lymphatic: Advanced atherosclerotic calcification of the abdominal aorta. The IVC is unremarkable. No portal venous gas. No adenopathy. Other: None Musculoskeletal: Osteopenia with degenerative changes. No acute osseous pathology. Grade 1 L4-L5 anterolisthesis. IMPRESSION: 1. No acute intra-abdominal pathology. No evidence of metastatic disease in the abdomen. 2. Colonic diverticulosis. 3.  Aortic Atherosclerosis (ICD10-I70.0). Electronically Signed   By: Vanetta Chou M.D.   On: 09/18/2024 19:17   CT Soft Tissue Neck W Contrast Result Date: 09/18/2024 EXAM: CT NECK WITH CONTRAST 09/18/2024 03:50:41 PM TECHNIQUE: CT of the neck was performed with the administration of 80 mL of iohexol (OMNIPAQUE) 300 MG/ML solution. Multiplanar reformatted images are provided for review. Automated exposure control, iterative reconstruction, and/or weight based adjustment of the mA/kV was utilized to reduce the radiation dose to as low as reasonably achievable. COMPARISON: None available. CLINICAL HISTORY: Head/neck cancer, staging. FINDINGS: AERODIGESTIVE TRACT: The nasopharynx is symmetric. Symmetric appearance of the oropharynx. There is subtle asymmetric soft tissue at the left posterior aspect of the tongue which may reflect postsurgical changes. Recommend correlation with PET CT. The epiglottis is unremarkable. Normal appearance of the retropharynx. The supraglottic airway is unremarkable. Symmetric  appearance of the vocal folds. SALIVARY GLANDS: The parotid glands are unremarkable. The left submandibular gland is not visualized and is likely surgically absent. The right submandibular gland is unremarkable. THYROID : Unremarkable. LYMPH NODES: Prominent heterogeneous left level 3/4 cervical node which measures 1.0 x 1.2 cm. Areas of central hypoattenuation concerning for possible central necrosis. Findings seen on series 3 image 77. There are no enlarged right sided cervical lymph nodes. SOFT TISSUES: Postsurgical changes of left sided neck dissection. Stranding noted within the left neck soft tissues underlying the mandible extending inferiorly to the level of the cricoid cartilage. Additional asymmetric soft tissue along the posterior aspect of the left sternocleidomastoid muscle which also likely reflects postsurgical changes. BRAIN, ORBITS, SINUSES AND MASTOIDS: Limited visualization of intracranial structures without focal abnormality identified. LUNGS AND MEDIASTINUM: Atherosclerosis of the aortic arch. Lung apices better evaluated on the same day CT chest. BONES: Degenerative changes in the spine with moderate  to severe disc space narrowing at C6-C7. VASCULATURE: Bulky calcified atherosclerosis involving the bilateral carotid bifurcations resulting in at least moderate stenosis. Prominent atherosclerosis of the carotid siphons. IMPRESSION: 1. Prominent heterogeneous left level 3/4 cervical lymph node measuring 1.0 x 1.2 cm with central hypoattenuation concerning for possible central necrosis. Recommend correlation with PET/CT. 2. Subtle asymmetric soft tissue at the left posterior tongue, possibly postsurgical. Recommend attention on PET/CT. 3. Postsurgical changes of the left neck with left submandibular gland likely surgically absent. 4. Bulky calcified atherosclerosis at the bilateral carotid bifurcations resulting in at least moderate stenosis. 5. Aortic arch atherosclerosis. 6. Carotid siphon  atherosclerosis. 7. Cervical spondylosis with moderate to severe disc space narrowing at C6-C7. Electronically signed by: Donnice Mania MD 09/18/2024 05:07 PM EST RP Workstation: [id number]   ECHOCARDIOGRAM COMPLETE Result Date: 08/22/2024    ECHOCARDIOGRAM REPORT   Patient Name:   Taylor Bush Date of Exam: 08/22/2024 Medical Rec #:  [id number]     Height:       60.0 in Accession #:    [id number]    Weight:       149.0 lb Date of Birth:  1945-12-29     BSA:          1.647 m Patient Age:    78 years      BP:           114/57 mmHg Patient Gender: F             HR:           68 bpm. Exam Location:  Inpatient Procedure: 2D Echo, Cardiac Doppler, Color Doppler and Intracardiac            Opacification Agent (Both Spectral and Color Flow Doppler were            utilized during procedure). Indications:    Atrial Fibrillation I48.91  History:        Patient has no prior history of Echocardiogram examinations.                 Risk Factors:Dyslipidemia and Hypertension.  Sonographer:    Tinnie Gosling RDCS Referring Phys: [id number] ZANE ADAMS IMPRESSIONS  1. Left ventricular ejection fraction, by estimation, is 55 to 60%. The left ventricle has normal function. The left ventricle has no regional wall motion abnormalities. There is mild concentric left ventricular hypertrophy. Left ventricular diastolic parameters are indeterminate.  2. Right ventricular systolic function is moderately reduced. The right ventricular size is normal.  3. Left atrial size was severely dilated.  4. The mitral valve is degenerative. Mild mitral valve regurgitation. No evidence of mitral stenosis.  5. Tricuspid valve regurgitation is mild to moderate.  6. The aortic valve is normal in structure. Aortic valve regurgitation is not visualized. Aortic valve sclerosis is present, with no evidence of aortic valve stenosis.  7. The inferior vena cava is normal in size with greater than 50% respiratory variability, suggesting right atrial pressure of 3  mmHg. FINDINGS  Left Ventricle: Left ventricular ejection fraction, by estimation, is 55 to 60%. The left ventricle has normal function. The left ventricle has no regional wall motion abnormalities. Definity contrast agent was given IV to delineate the left ventricular  endocardial borders. The left ventricular internal cavity size was normal in size. There is mild concentric left ventricular hypertrophy. Left ventricular diastolic parameters are indeterminate. Right Ventricle: The right ventricular size is normal. No increase in right ventricular wall thickness. Right ventricular systolic function is moderately  reduced. Left Atrium: Left atrial size was severely dilated. Right Atrium: Right atrial size was normal in size. Pericardium: There is no evidence of pericardial effusion. Mitral Valve: The mitral valve is degenerative in appearance. Mild to moderate mitral annular calcification. Mild mitral valve regurgitation. No evidence of mitral valve stenosis. Tricuspid Valve: The tricuspid valve is normal in structure. Tricuspid valve regurgitation is mild to moderate. No evidence of tricuspid stenosis. Aortic Valve: The aortic valve is normal in structure. Aortic valve regurgitation is not visualized. Aortic valve sclerosis is present, with no evidence of aortic valve stenosis. Pulmonic Valve: The pulmonic valve was normal in structure. Pulmonic valve regurgitation is not visualized. No evidence of pulmonic stenosis. Aorta: The aortic root is normal in size and structure. Venous: The inferior vena cava is normal in size with greater than 50% respiratory variability, suggesting right atrial pressure of 3 mmHg. IAS/Shunts: No atrial level shunt detected by color flow Doppler.  LEFT VENTRICLE PLAX 2D LVIDd:         4.20 cm   Diastology LVIDs:         2.60 cm   LV e' medial:  6.96 cm/s LV PW:         1.00 cm   LV e' lateral: 7.29 cm/s LV IVS:        1.10 cm LVOT diam:     2.00 cm LV SV:         55 LV SV Index:   33 LVOT  Area:     3.14 cm LV IVRT:       74 msec  RIGHT VENTRICLE            IVC RV S prime:     7.83 cm/s  IVC diam: 1.90 cm TAPSE (M-mode): 0.7 cm LEFT ATRIUM              Index        RIGHT ATRIUM           Index LA diam:        4.70 cm  2.85 cm/m   RA Area:     11.20 cm LA Vol (A2C):   115.0 ml 69.82 ml/m  RA Volume:   18.10 ml  10.99 ml/m LA Vol (A4C):   61.0 ml  37.03 ml/m LA Biplane Vol: 85.6 ml  51.97 ml/m  AORTIC VALVE LVOT Vmax:   83.50 cm/s LVOT Vmean:  56.400 cm/s LVOT VTI:    0.174 m  AORTA Ao Root diam: 2.80 cm Ao Asc diam:  3.10 cm TRICUSPID VALVE TR Peak grad:   25.2 mmHg TR Vmax:        251.00 cm/s  SHUNTS Systemic VTI:  0.17 m Systemic Diam: 2.00 cm Kardie Tobb DO Electronically signed by Dub Huntsman DO Signature Date/Time: 08/22/2024/1:06:15 PM    Final    DG CHEST PORT 1 VIEW Result Date: 08/21/2024 CLINICAL DATA:  Atrial fibrillation EXAM: PORTABLE CHEST 1 VIEW COMPARISON:  05/03/2017 FINDINGS: Heart and mediastinal contours are within normal limits. No focal opacities or effusions. No acute bony abnormality. IMPRESSION: No active disease. Electronically Signed   By: Franky Crease M.D.   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 I *** recommend radiotherapy for this patient.  We discussed the potential risks, benefits, and side effects of radiotherapy. We talked in detail about acute and late effects. We discussed that some of the most bothersome acute effects  may be mucositis, dysgeusia, salivary changes, skin irritation, hair loss, dehydration, weight loss and fatigue. We talked about late effects which include but are not necessarily limited to dysphagia, hypothyroidism, nerve injury, vascular injury, spinal cord injury, xerostomia, trismus, neck edema, dental issues, non-healing wound, and potentially fatal injury to any of the tissues in the head and neck region. No guarantees of treatment were given. A consent form was  signed and placed in the patient's medical record. The patient is enthusiastic about proceeding with treatment. I look forward to participating in the patient's care.    Simulation (treatment planning) will take place ***  We also discussed that the treatment of head and neck cancer is a multidisciplinary process to maximize treatment outcomes and quality of life. For this reason the following referrals have been or will be made:  *** Medical oncology to discuss chemotherapy   *** Dentistry for dental evaluation, possible extractions in the radiation fields, and /or advice on reducing risk of cavities, osteoradionecrosis, or other oral issues.  *** Nutritionist for nutrition support during and after treatment.  *** Speech language pathology for swallowing and/or speech therapy.  *** Social work for social support.   *** Physical therapy due to risk of lymphedema in neck and deconditioning.  *** Baseline labs including TSH.  On date of service, in total, I spent *** minutes on this encounter. Patient was seen in person.  __________________________________________   Lauraine Golden, MD  This document serves as a record of services personally performed by Lauraine Golden, MD. It was created on her behalf by Dorthy Fuse, a trained medical scribe. The creation of this record is based on the scribe's personal observations and the provider's statements to them. This document has been checked and approved by the attending provider.

## 2024-09-20 ENCOUNTER — Inpatient Hospital Stay: Attending: Oncology | Admitting: Oncology

## 2024-09-20 ENCOUNTER — Inpatient Hospital Stay

## 2024-09-20 ENCOUNTER — Encounter: Payer: Self-pay | Admitting: Radiation Oncology

## 2024-09-20 ENCOUNTER — Other Ambulatory Visit: Payer: Self-pay

## 2024-09-20 ENCOUNTER — Ambulatory Visit
Admission: RE | Admit: 2024-09-20 | Discharge: 2024-09-20 | Disposition: A | Discharge status: Home / Self Care | Source: Ambulatory Visit | Attending: Radiation Oncology | Admitting: Radiation Oncology

## 2024-09-20 VITALS — BP 158/71 | HR 70 | Temp 97.7°F | Resp 14 | Ht 60.0 in | Wt 143.8 lb

## 2024-09-20 VITALS — BP 127/60 | HR 77 | Temp 97.9°F | Resp 20 | Ht 60.0 in | Wt 143.8 lb

## 2024-09-20 DIAGNOSIS — E538 Deficiency of other specified B group vitamins: Secondary | ICD-10-CM | POA: Insufficient documentation

## 2024-09-20 DIAGNOSIS — C023 Malignant neoplasm of anterior two-thirds of tongue, part unspecified: Secondary | ICD-10-CM | POA: Insufficient documentation

## 2024-09-20 DIAGNOSIS — M4316 Spondylolisthesis, lumbar region: Secondary | ICD-10-CM | POA: Insufficient documentation

## 2024-09-20 DIAGNOSIS — M1990 Unspecified osteoarthritis, unspecified site: Secondary | ICD-10-CM | POA: Insufficient documentation

## 2024-09-20 DIAGNOSIS — I4891 Unspecified atrial fibrillation: Secondary | ICD-10-CM | POA: Insufficient documentation

## 2024-09-20 DIAGNOSIS — I10 Essential (primary) hypertension: Secondary | ICD-10-CM | POA: Insufficient documentation

## 2024-09-20 DIAGNOSIS — I700 Atherosclerosis of aorta: Secondary | ICD-10-CM | POA: Insufficient documentation

## 2024-09-20 DIAGNOSIS — I890 Lymphedema, not elsewhere classified: Secondary | ICD-10-CM | POA: Insufficient documentation

## 2024-09-20 DIAGNOSIS — D631 Anemia in chronic kidney disease: Secondary | ICD-10-CM | POA: Insufficient documentation

## 2024-09-20 DIAGNOSIS — I739 Peripheral vascular disease, unspecified: Secondary | ICD-10-CM | POA: Insufficient documentation

## 2024-09-20 DIAGNOSIS — M545 Low back pain, unspecified: Secondary | ICD-10-CM | POA: Insufficient documentation

## 2024-09-20 DIAGNOSIS — I2510 Atherosclerotic heart disease of native coronary artery without angina pectoris: Secondary | ICD-10-CM | POA: Insufficient documentation

## 2024-09-20 DIAGNOSIS — C029 Malignant neoplasm of tongue, unspecified: Secondary | ICD-10-CM

## 2024-09-20 DIAGNOSIS — M8580 Other specified disorders of bone density and structure, unspecified site: Secondary | ICD-10-CM | POA: Insufficient documentation

## 2024-09-20 DIAGNOSIS — Z87891 Personal history of nicotine dependence: Secondary | ICD-10-CM | POA: Insufficient documentation

## 2024-09-20 DIAGNOSIS — Z79899 Other long term (current) drug therapy: Secondary | ICD-10-CM | POA: Insufficient documentation

## 2024-09-20 DIAGNOSIS — Z7901 Long term (current) use of anticoagulants: Secondary | ICD-10-CM | POA: Insufficient documentation

## 2024-09-20 DIAGNOSIS — E785 Hyperlipidemia, unspecified: Secondary | ICD-10-CM | POA: Insufficient documentation

## 2024-09-20 DIAGNOSIS — Z7902 Long term (current) use of antithrombotics/antiplatelets: Secondary | ICD-10-CM | POA: Insufficient documentation

## 2024-09-20 DIAGNOSIS — N183 Chronic kidney disease, stage 3 unspecified: Secondary | ICD-10-CM | POA: Insufficient documentation

## 2024-09-20 DIAGNOSIS — N189 Chronic kidney disease, unspecified: Secondary | ICD-10-CM | POA: Insufficient documentation

## 2024-09-20 DIAGNOSIS — R911 Solitary pulmonary nodule: Secondary | ICD-10-CM

## 2024-09-20 NOTE — Progress Notes (Addendum)
 [facility]  ONCOLOGY CONSULT NOTE   PATIENT NAME: Taylor Bush   MR#: [id number] DOB: Nov 03, 1946  DATE OF SERVICE: 09/20/2024   REFERRING PROVIDER  Ida Loader, MD  Patient Care Team: Watt Mirza, MD as PCP - General Jordan, Peter M, MD as PCP - Cardiology (Cardiology) Loader Ida, MD as Consulting Physician (Otolaryngology) Izell Domino, MD as Attending Physician (Radiation Oncology)    CHIEF COMPLAINT/ PURPOSE OF CONSULTATION:   Squamous cell carcinoma of tongue, status post left partial glossectomy on 06/17/2024 and neck dissection on 08/21/2024.  3/33 lymph nodes positive with extracapsular extension.  ASSESSMENT & PLAN:   Taylor Bush is a 78 y.o. pleasant lady with a past medical history of CKD stage III, hypertension, dyslipidemia, arthritis, peripheral vascular disease, past smoking, was referred to our clinic for recently diagnosed squamous of carcinoma of the tongue status post left partial glossectomy on 06/17/2024 followed by neck dissection on 08/21/2024.  pT2, pN3B, Mx disease.  At least stage IVb disease.  Malignant neoplasm of anterior two-thirds of tongue [facility]) Please review oncology history for additional details and timeline of events.  Post-surgical status following tongue cancer with cervical lymph node involvement and extracapsular extension.   Neck dissection on October 15th with removal of 33 lymph nodes, 3 with cancer. High risk of recurrence due to extracapsular extension.   CT chest on 09/18/2024 showed mild mediastinal lymphadenopathy and metastatic involvement could not be excluded.  8 mm anterior left upper lobe lung nodule noted as well.  Will obtain PET scan for further evaluation.  Request placed today.  Will consider pulmonology referral based on PET scan findings.    Provided no evidence of metastatic disease is noted, at the minimum radiation therapy recommended to target neck area. Chemotherapy considered  supportive to enhance radiation efficacy. Cisplatin contraindicated due to chronic kidney disease. Alternatives include carboplatin/paclitaxel or docetaxel. Chemotherapy side effects include low blood counts, nausea, fatigue, electrolyte disturbances, neuropathy, and reversible hair loss.   Following PET scan findings, we will finalize treatment plan and arrange chemo education, Port-A-Cath placement accordingly.  Feeding tube will be arranged if patient is proceeding with concurrent chemoradiation.  In case of chest metastatic disease, we could consider single agent pembrolizumab therapy or pembrolizumab in combination with carboplatin/5-FU.  Final plan will be decided once above workup is complete.   Anemia in CKD Slightly low red blood cell count. Previous transfusions in September. No current symptoms of bleeding or significant blood loss. - Continue to monitor blood counts during treatment.  Lymphedema of neck Swelling in neck area post-surgery, likely due to lymph node removal and impaired lymph drainage. - She will be referred to physical therapy for management eventually.  Non-healing wound of foot after dog bite and surgery Non-healing wound on foot following dog bite and surgery. Healing delayed due to infection and need for stents to improve blood flow. Chemotherapy may delay wound healing, so timing of treatment is crucial. - Continue follow-up with orthopedic surgeon for wound management. - Delay chemotherapy until wound is healed.  I reviewed lab results and outside records for this visit and discussed relevant results with the patient. Diagnosis, plan of care and treatment options were also discussed in detail with the patient. Opportunity provided to ask questions and answers provided to her apparent satisfaction. Provided instructions to call our clinic with any problems, questions or concerns prior to return visit. I recommended to continue follow-up with PCP and  sub-specialists. She verbalized understanding and agreed  with the plan. No barriers to learning was detected.  NCCN guidelines have been consulted in the planning of this patient's care.  Chinita Patten, MD  09/20/2024 10:45 AM  [facility] - A DEPT OF [facility] [address] [place] [address] Dept: [phone] Dept Fax: [phone]   HISTORY OF PRESENTING ILLNESS:   I have reviewed her chart and materials related to her cancer extensively and collaborated history with the patient. Summary of oncologic history is as follows:  ONCOLOGY HISTORY:  She has a history of recurrent oral leukoplakia followed by Dr. Jesus since at least 2016. She also had a medical history notable for stage 3 CKD, chronic a-fib, and PAD.   Upon record review, she presented to Dr. Jesus on 08/14/23 for evaluation of new white spots on the posterior left lateral aspect of the tongue. Recurrent oral leukoplakia was suspected and laser ablation and biopsies under anesthesia were recommended (which she had undergone in the past in the same setting). Biopsies of the left tongue were promptly obtained on 10/02/23 and showed moderate squamous dysplasia without evidence of carcinoma.    She remained without issues to her tongue in the following months but later presented to Dr. Jesus on 06/13/24 with c/o worsening irritation to the left side of her tongue over the past several months. Oral exam performed at that time noted a 3 cm raised ulcerative lesion with surrounding leukoplakia to the left posterior lateral tongue.    Based on Dr. Godfrey recommendations, she opted to proceed with a left partial glossectomy on 06/17/24. Pathology from the procedure revealed: tumor the size of 1.9 cm; histology of invasive well to moderately differentiated squamous cell carcinoma invading a depth of 0.9 cm, along with adjacent high-grade squamous dysplasia; p16  negative; angiolymphatic invasion present; all margins negative for dysplasia or carcinoma. No lymph nodes were excised.    Based on the depth of invasion, she underwent left modified neck dissection on 08/21/24. Pathology from the procedure revealed: 03/33 lymph nodes positive for metastatic SCC; the largest lymph node involved was a level II lymph node measuring 1.9 cm. The same large level II lymph node was also positive for focal extracapsular extension; with a distance of less than 2 mm from the lymph node capsule (microscopic extranodal extension).    Her post-op course was uncomplicated per her subsequent follow-up examinations by Dr. Jesus. Radiation therapy has been recommended and Dr. Jesus also referred her to medical oncology.    Pertinent imaging performed thus far includes:  -- Soft tissue neck CT with contrast on 09/18/24 which demonstrated: a prominent heterogeneous left level 3/4 cervical lymph node measuring 1.0 x 1.2 cm with central hypoattenuation concerning for possible central necrosis; subtle asymmetric soft tissue at the left posterior tongue (likely related to recent surgical changes); and postsurgical changes of the left neck with presumed surgical absence of the left submandibular gland.  -- CT of the abdomen on 09/18/24 showed no evidence of metastatic disease in the abdomen or acute findings overall.  --CT Chest 09/18/24:  Lower cervical lymphadenopathy characterized on CT neck performed at the same time and reported separately. Mild mediastinal lymphadenopathy. Metastatic involvement not excluded. PET-CT suggested to further evaluate. 8 mm anterior left upper lobe pulmonary nodule shows mixed attenuation with sub solid features. This nodule warrants close follow-up as metastatic disease or lung primary not excluded. PET-CT may prove helpful.  Small right pleural effusion with dependent  atelectasis right lower lobe.  On her consultation with us  on 09/20/2024, request placed  for staging PET-CT scan for further evaluation of chest lymphadenopathy.  Depending on PET scan findings, we will consider proceeding with adjuvant treatments with concurrent chemoradiation versus chemotherapy/immunotherapy if metastatic disease is evident.  Given her CKD, she is not a candidate for cisplatin.  If proceeding with concurrent chemoradiation, we will plan for alternative regimen with either carboplatin/paclitaxel or docetaxel.   INTERVAL HISTORY:  Discussed the use of AI scribe software for clinical note transcription with the patient, who gave verbal consent to proceed.  History of Present Illness Taylor Bush is a 78 year old female with cancer who presents for follow-up after lymph node surgery and to discuss further treatment options. She was referred by Dr. Jesus for follow-up after surgery and further treatment planning.  She underwent surgery on August 21, 2024, where 33 lymph nodes were removed, and 3 were found to have cancer, with extracapsular extension noted in at least one lymph node.  Her appetite is okay, but food does not taste the same since her surgery. No trouble swallowing. She has a history of anemia, with a significant drop in hemoglobin to 6.4 in September, requiring blood transfusions. No current bleeding, such as blood in stools or epistaxis.  She has chronic kidney disease, which affects chemotherapy options, as cisplatin is not suitable. Alternatives like carboplatin and paclitaxel or docetaxel are being considered. She has a history of slightly low red blood cell count, but her white blood cell count and platelets are stable.  She is recovering from a dog bite on her foot, which required surgery and resulted in an infection. Stents were placed in her leg to improve blood flow for healing. She is not currently on antibiotics and is awaiting further follow-up with her orthopedic surgeon.  She quit smoking and is not currently smoking. She is able to  take care of herself at home and has been managing her energy levels well.   MEDICAL HISTORY:  Past Medical History:  Diagnosis Date   Allergic rhinitis    Allergy    Anemia in chronic kidney disease (CKD)    Arthritis    B12 deficiency 09/13/2021   Blood transfusion 2012   and 2013   Blood transfusion without reported diagnosis    Cataract    bilateral sx   Chronic low back pain    with radiculopathy   CKD (chronic kidney disease) stage 3, GFR 30-59 ml/min (HCC) 11/29/2013   HLD (hyperlipidemia)    HTN (hypertension)    Hx of total hip arthroplasty 12/15/2011   Open-angle glaucoma    PAD (peripheral artery disease)    with intermittent claudication, ABI 0.72 on Rt and 0.73 on L when assessed in 2010.     SURGICAL HISTORY: Past Surgical History:  Procedure Laterality Date   ABDOMINAL AORTOGRAM W/LOWER EXTREMITY N/A 07/09/2024   Procedure: ABDOMINAL AORTOGRAM W/LOWER EXTREMITY;  Surgeon: Serene Gaile ORN, MD;  [facility];  Service: Cardiovascular;  Laterality: N/A;   abnormal vascular studies  05/07/2009   ABI's 70, R SFA > 50%^ blockage    APPLICATION OF WOUND VAC Left 07/12/2024   Procedure: APPLICATION, WOUND VAC;  Surgeon: Harden Jerona GAILS, MD;  Location: [facility];  Service: Orthopedics;  Laterality: Left;   CATARACT EXTRACTION, BILATERAL  2011   EXCISION OF TONGUE LESION WITH LASER N/A 08/31/2015   Procedure: BIOPSY AND CO2 LASER OF TONGUE LESION ;  Surgeon:  Ida Loader, MD;  Location: [facility];  Service: ENT;  Laterality: N/A;   EXCISION OF TONGUE LESION WITH LASER Left 06/29/2020   Procedure: EXCISION OF TONGUE LESION/CO2 laser w/biopsy;  Surgeon: Loader Ida, MD;  Location: [facility];  Service: ENT;  Laterality: Left;   EXCISION OF TONGUE LESION WITH LASER Bilateral 10/02/2023   Procedure: ORAL BIOPSY AND CO2 LASER ABLATATION OF TONGUE LESION;  Surgeon: Loader Ida, MD;  Location: [facility];  Service: ENT;  Laterality:  Bilateral;   EXCISION ORAL LESION WITH CO2 LASER N/A 12/08/2021   Procedure: EXCISION OF ORAL ORAL CAVITY LESION WITH CO2 LASER;  Surgeon: Loader Ida, MD;  Location: [facility];  Service: ENT;  Laterality: N/A;   EYE SURGERY     GLOSSECTOMY, PARTIAL Left 06/17/2024   Procedure: GLOSSECTOMY, PARTIAL;  Surgeon: Loader Ida, MD;  Location: [facility] OR;  Service: ENT;  Laterality: Left;   INCISION AND DRAINAGE OF WOUND Left 07/12/2024   Procedure: IRRIGATION AND DEBRIDEMENT WOUND;  Surgeon: Harden Jerona GAILS, MD;  Location: [facility] OR;  Service: Orthopedics;  Laterality: Left;  LEFT FOOT DEBRIDEMENT   JOINT REPLACEMENT     LOWER EXTREMITY ANGIOGRAPHY  07/09/2024   Procedure: Lower Extremity Angiography;  Surgeon: Serene Gaile ORN, MD;  [facility];  Service: Cardiovascular;;   LOWER EXTREMITY INTERVENTION  07/09/2024   Procedure: LOWER EXTREMITY INTERVENTION;  Surgeon: Serene Gaile ORN, MD;  [facility];  Service: Cardiovascular;;   OTHER SURGICAL HISTORY     ganglion cyst removed left wrist    PERIPHERAL INTRAVASCULAR LITHOTRIPSY  07/09/2024   Procedure: PERIPHERAL INTRAVASCULAR LITHOTRIPSY;  Surgeon: Serene Gaile ORN, MD;  [facility];  Service: Cardiovascular;;   PERIPHERAL VASCULAR ATHERECTOMY  07/09/2024   Procedure: PERIPHERAL VASCULAR ATHERECTOMY;  Surgeon: Serene Gaile ORN, MD;  [facility];  Service: Cardiovascular;;   RADICAL NECK DISSECTION Left 08/21/2024   Procedure: DISSECTION, NECK, RADICAL;  Surgeon: Loader Ida, MD;  [facility];  Service: ENT;  Laterality: Left;   TOTAL HIP ARTHROPLASTY  10/03/2011   Procedure: TOTAL HIP ARTHROPLASTY;  Surgeon: Dempsey GAILS Moan;  Location: [facility];  Service: Orthopedics;  Laterality: Right;   TOTAL HIP ARTHROPLASTY  12/21/2011   Procedure: TOTAL HIP ARTHROPLASTY;  Surgeon: Dempsey GAILS Moan, MD;  Location: [facility];  Service: Orthopedics;  Laterality: Left;   TUBAL LIGATION       SOCIAL HISTORY: She reports that she quit smoking about 15 years ago. Her smoking use included cigarettes. She has never used smokeless tobacco. She reports that she does not drink alcohol  and does not use drugs. Social History   Socioeconomic History   Marital status: Widowed    Spouse name: Not on file   Number of children: Not on file   Years of education: Not on file   Highest education level: Not on file  Occupational History   Not on file  Tobacco Use   Smoking status: Former    Current packs/day: 0.00    Types: Cigarettes    Quit date: 11/07/2008    Years since quitting: 15.8   Smokeless tobacco: Never  Vaping Use   Vaping status: Never Used  Substance and Sexual Activity   Alcohol  use: No   Drug use: No   Sexual activity: Not on file  Other Topics Concern   Not on file  Social History Narrative   Retired haematologist, 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    Social Drivers of Corporate Investment Banker Strain: Low Risk  (04/29/2024)   Overall Financial Resource Strain (CARDIA)    Difficulty of Paying Living Expenses: Not hard at all  Food Insecurity: No Food Insecurity (09/20/2024)   Hunger Vital Sign    Worried About Running Out of Food in the Last Year: Never true    Ran Out of Food in the Last Year: Never true  Transportation Needs: No Transportation Needs (09/20/2024)   PRAPARE - Administrator, Civil Service (Medical): No    Lack of Transportation (Non-Medical): No  Physical Activity: Insufficiently Active (04/29/2024)   Exercise Vital Sign    Days of Exercise per Week: 7 days    Minutes of Exercise per Session: 20 min  Stress: No Stress Concern Present (04/29/2024)   [employer] of Occupational Health - Occupational Stress Questionnaire    Feeling of Stress: Not at all  Social Connections: Moderately Isolated (08/21/2024)   Social Connection and Isolation Panel    Frequency of Communication with Friends and Family: More than three times a  week    Frequency of Social Gatherings with Friends and Family: More than three times a week    Attends Religious Services: More than 4 times per year    Active Member of [employer] or Organizations: No    Attends Banker Meetings: Never    Marital Status: Widowed  Intimate Partner Violence: Not At Risk (09/20/2024)   Humiliation, Afraid, Rape, and Kick questionnaire    Fear of Current or Ex-Partner: No    Emotionally Abused: No    Physically Abused: No    Sexually Abused: No    FAMILY HISTORY: Family History  Problem Relation Age of Onset   Colon polyps Mother 21   Heart attack Father        73s   Stroke Neg Hx        also no PAD or aneurysm   Colon cancer Neg Hx    Esophageal cancer Neg Hx    Stomach cancer Neg Hx    Rectal cancer Neg Hx     ALLERGIES:  She is allergic to prednisone and statins.  MEDICATIONS:  Current Outpatient Medications  Medication Sig Dispense Refill   amLODipine  (NORVASC ) 10 MG tablet TAKE 1 TABLET BY MOUTH DAILY 100 tablet 0   apixaban (ELIQUIS) 5 MG TABS tablet Take 1 tablet (5 mg total) by mouth 2 (two) times daily. 60 tablet 3   atorvastatin  (LIPITOR ) 80 MG tablet Take 1 tablet (80 mg total) by mouth daily. 30 tablet 3   clopidogrel  (PLAVIX ) 75 MG tablet Take 1 tablet (75 mg total) by mouth daily with breakfast. 30 tablet 3   Faricimab -svoa (VABYSMO  IZ) 1 Dose by Intravitreal route every 6 (six) weeks.     HYDROcodone -acetaminophen  (NORCO/VICODIN) 5-325 MG tablet Take 1 tablet by mouth every 6 (six) hours as needed for moderate pain (pain score 4-6). 16 tablet 0   lisinopril  (ZESTRIL ) 20 MG tablet TAKE 1 TABLET BY MOUTH DAILY 100 tablet 0   metoprolol  (TOPROL -XL) 200 MG 24 hr tablet TAKE 1 TABLET BY MOUTH DAILY 100 tablet 0   mupirocin  ointment (BACTROBAN ) 2 % Place 1 Application into the nose 2 (two) times daily. (Patient not taking: Reported on 09/05/2024) 22 g 0   nitroGLYCERIN  (NITRODUR - DOSED IN MG/24 HR) 0.2 mg/hr patch Place 1  patch (0.2 mg total) onto the skin daily. 30 patch 12  ondansetron  (ZOFRAN -ODT) 4 MG disintegrating tablet Take 1 tablet (4 mg total) by mouth every 8 (eight) hours as needed for nausea or vomiting. (Patient not taking: Reported on 09/05/2024) 20 tablet 0   ondansetron  (ZOFRAN -ODT) 4 MG disintegrating tablet Take 1 tablet (4 mg total) by mouth every 8 (eight) hours as needed for nausea or vomiting. 20 tablet 0   senna-docusate (SENOKOT-S) 8.6-50 MG tablet Take 1 tablet by mouth at bedtime as needed for mild constipation. (Patient not taking: Reported on 09/05/2024)     traMADol  (ULTRAM ) 50 MG tablet Take 1 tablet (50 mg total) by mouth every 8 (eight) hours as needed. 40 tablet 2   No current facility-administered medications for this visit.    REVIEW OF SYSTEMS:    Review of Systems - Oncology  All other pertinent systems were reviewed with the patient and are negative.  PHYSICAL EXAMINATION:   Onc Performance Status - 09/20/24 1000       ECOG Perf Status   ECOG Perf Status Restricted in physically strenuous activity but ambulatory and able to carry out work of a light or sedentary nature, e.g., light house work, office work      KPS SCALE   KPS % SCORE Normal activity with effort, some s/s of disease          Vitals:   09/20/24 1000 09/20/24 1041  BP: (!) 162/86 (!) 158/71  Pulse: 70   Resp: 14   Temp: 97.7 F (36.5 C)   SpO2: 100%    Filed Weights   09/20/24 1000  Weight: 143 lb 12.8 oz (65.2 kg)    Physical Exam Constitutional:      General: She is not in acute distress.    Appearance: Normal appearance.  HENT:     Head: Normocephalic and atraumatic.     Mouth/Throat:     Comments: Well-healed scar along the left tongue bordering the floor of mouth  Neck:     Comments: Left-sided neck dissection scar is healing well without signs of infection.  Mild lymphedema noted. Cardiovascular:     Rate and Rhythm: Normal rate.  Pulmonary:     Effort: Pulmonary  effort is normal. No respiratory distress.  Abdominal:     General: There is no distension.  Neurological:     General: No focal deficit present.     Mental Status: She is alert and oriented to person, place, and time.  Psychiatric:        Mood and Affect: Mood normal.        Behavior: Behavior normal.       LABORATORY DATA:   I have reviewed the data as listed.  No results found for any visits on 09/20/24.  RADIOGRAPHIC STUDIES:  I have personally reviewed the radiological images as listed and agree with the findings in the report.  CT Chest W Contrast Result Date: 09/19/2024 CLINICAL DATA:  Head and neck cancer. Staging. * Tracking Code: BO * EXAM: CT CHEST WITH CONTRAST TECHNIQUE: Multidetector CT imaging of the chest was performed during intravenous contrast administration. RADIATION DOSE REDUCTION: This exam was performed according to the departmental dose-optimization program which includes automated exposure control, adjustment of the mA and/or kV according to patient size and/or use of iterative reconstruction technique. CONTRAST:  80mL OMNIPAQUE IOHEXOL 300 MG/ML  SOLN COMPARISON:  None Available. FINDINGS: Cardiovascular: The heart is enlarged. No substantial pericardial effusion. Coronary artery calcification is evident. Mild atherosclerotic calcification is noted in the wall of the thoracic aorta.  Mediastinum/Nodes: Lower cervical lymphadenopathy (image 7/series 2) characterized on CT neck performed at the same time and reported separately. 12 mm short axis AP window lymph node identified on 41/2. 12 mm short axis low right paratracheal lymph node seen on 44/2. There is no hilar lymphadenopathy. The esophagus has normal imaging features. There is no axillary lymphadenopathy. Lungs/Pleura: Subtle changes of centrilobular emphysema noted. Scattered tiny bilateral pulmonary nodules, many of which are calcified consistent with granulomata. 8 mm anterior left upper lobe pulmonary  nodule seen on 30/9/6 shows mixed attenuation with sub solid features. Dependent atelectasis noted right lower lobe with small right pleural effusion. Upper Abdomen: See abdomen CT report from same day dictated separately. Musculoskeletal: No worrisome lytic or sclerotic osseous abnormality. IMPRESSION: 1. Lower cervical lymphadenopathy characterized on CT neck performed at the same time and reported separately. 2. Mild mediastinal lymphadenopathy. Metastatic involvement not excluded. PET-CT suggested to further evaluate. 3. 8 mm anterior left upper lobe pulmonary nodule shows mixed attenuation with sub solid features. This nodule warrants close follow-up as metastatic disease or lung primary not excluded. PET-CT may prove helpful. 4. Small right pleural effusion with dependent atelectasis right lower lobe. 5. Aortic Atherosclerosis (ICD10-I70.0) and Emphysema (ICD10-J43.9). Electronically Signed   By: Camellia Candle M.D.   On: 09/19/2024 05:12   CT ABDOMEN W CONTRAST Result Date: 09/18/2024 CLINICAL DATA:  Head and neck cancer staging. EXAM: CT ABDOMEN WITH CONTRAST TECHNIQUE: Multidetector CT imaging of the abdomen was performed using the standard protocol following bolus administration of intravenous contrast. RADIATION DOSE REDUCTION: This exam was performed according to the departmental dose-optimization program which includes automated exposure control, adjustment of the mA and/or kV according to patient size and/or use of iterative reconstruction technique. CONTRAST:  80mL OMNIPAQUE IOHEXOL 300 MG/ML  SOLN COMPARISON:  Chest CT dated 09/18/2024. FINDINGS: Lower chest: See chest CT report. Hepatobiliary: The liver is unremarkable. No biliary dilatation. The gallbladder is unremarkable. Pancreas: Unremarkable. No pancreatic ductal dilatation or surrounding inflammatory changes. Spleen: Normal in size without focal abnormality. Adrenals/Urinary Tract: The adrenal glands unremarkable. Vascular calcifications  versus less likely small nonobstructing bilateral renal calculi. There is no hydronephrosis on either side. The visualized ureters appear unremarkable. Stomach/Bowel: Scattered distal colonic diverticula. No bowel obstruction. Vascular/Lymphatic: Advanced atherosclerotic calcification of the abdominal aorta. The IVC is unremarkable. No portal venous gas. No adenopathy. Other: None Musculoskeletal: Osteopenia with degenerative changes. No acute osseous pathology. Grade 1 L4-L5 anterolisthesis. IMPRESSION: 1. No acute intra-abdominal pathology. No evidence of metastatic disease in the abdomen. 2. Colonic diverticulosis. 3.  Aortic Atherosclerosis (ICD10-I70.0). Electronically Signed   By: Vanetta Chou M.D.   On: 09/18/2024 19:17   CT Soft Tissue Neck W Contrast Result Date: 09/18/2024 EXAM: CT NECK WITH CONTRAST 09/18/2024 03:50:41 PM TECHNIQUE: CT of the neck was performed with the administration of 80 mL of iohexol (OMNIPAQUE) 300 MG/ML solution. Multiplanar reformatted images are provided for review. Automated exposure control, iterative reconstruction, and/or weight based adjustment of the mA/kV was utilized to reduce the radiation dose to as low as reasonably achievable. COMPARISON: None available. CLINICAL HISTORY: Head/neck cancer, staging. FINDINGS: AERODIGESTIVE TRACT: The nasopharynx is symmetric. Symmetric appearance of the oropharynx. There is subtle asymmetric soft tissue at the left posterior aspect of the tongue which may reflect postsurgical changes. Recommend correlation with PET CT. The epiglottis is unremarkable. Normal appearance of the retropharynx. The supraglottic airway is unremarkable. Symmetric appearance of the vocal folds. SALIVARY GLANDS: The parotid glands are unremarkable. The left submandibular gland is  not visualized and is likely surgically absent. The right submandibular gland is unremarkable. THYROID : Unremarkable. LYMPH NODES: Prominent heterogeneous left level 3/4 cervical  node which measures 1.0 x 1.2 cm. Areas of central hypoattenuation concerning for possible central necrosis. Findings seen on series 3 image 77. There are no enlarged right sided cervical lymph nodes. SOFT TISSUES: Postsurgical changes of left sided neck dissection. Stranding noted within the left neck soft tissues underlying the mandible extending inferiorly to the level of the cricoid cartilage. Additional asymmetric soft tissue along the posterior aspect of the left sternocleidomastoid muscle which also likely reflects postsurgical changes. BRAIN, ORBITS, SINUSES AND MASTOIDS: Limited visualization of intracranial structures without focal abnormality identified. LUNGS AND MEDIASTINUM: Atherosclerosis of the aortic arch. Lung apices better evaluated on the same day CT chest. BONES: Degenerative changes in the spine with moderate to severe disc space narrowing at C6-C7. VASCULATURE: Bulky calcified atherosclerosis involving the bilateral carotid bifurcations resulting in at least moderate stenosis. Prominent atherosclerosis of the carotid siphons. IMPRESSION: 1. Prominent heterogeneous left level 3/4 cervical lymph node measuring 1.0 x 1.2 cm with central hypoattenuation concerning for possible central necrosis. Recommend correlation with PET/CT. 2. Subtle asymmetric soft tissue at the left posterior tongue, possibly postsurgical. Recommend attention on PET/CT. 3. Postsurgical changes of the left neck with left submandibular gland likely surgically absent. 4. Bulky calcified atherosclerosis at the bilateral carotid bifurcations resulting in at least moderate stenosis. 5. Aortic arch atherosclerosis. 6. Carotid siphon atherosclerosis. 7. Cervical spondylosis with moderate to severe disc space narrowing at C6-C7. Electronically signed by: Donnice Mania MD 09/18/2024 05:07 PM EST RP Workstation: [id number]   ECHOCARDIOGRAM COMPLETE Result Date: 08/22/2024    ECHOCARDIOGRAM REPORT   Patient Name:   Taylor Bush  Date of Exam: 08/22/2024 Medical Rec #:  [id number]     Height:       60.0 in Accession #:    [id number]    Weight:       149.0 lb Date of Birth:  31-Jul-1946     BSA:          1.647 m Patient Age:    78 years      BP:           114/57 mmHg Patient Gender: F             HR:           68 bpm. Exam Location:  Inpatient Procedure: 2D Echo, Cardiac Doppler, Color Doppler and Intracardiac            Opacification Agent (Both Spectral and Color Flow Doppler were            utilized during procedure). Indications:    Atrial Fibrillation I48.91  History:        Patient has no prior history of Echocardiogram examinations.                 Risk Factors:Dyslipidemia and Hypertension.  Sonographer:    Tinnie Gosling RDCS Referring Phys: [id number] ZANE ADAMS IMPRESSIONS  1. Left ventricular ejection fraction, by estimation, is 55 to 60%. The left ventricle has normal function. The left ventricle has no regional wall motion abnormalities. There is mild concentric left ventricular hypertrophy. Left ventricular diastolic parameters are indeterminate.  2. Right ventricular systolic function is moderately reduced. The right ventricular size is normal.  3. Left atrial size was severely dilated.  4. The mitral valve is degenerative. Mild mitral valve regurgitation. No evidence of mitral stenosis.  5. Tricuspid  valve regurgitation is mild to moderate.  6. The aortic valve is normal in structure. Aortic valve regurgitation is not visualized. Aortic valve sclerosis is present, with no evidence of aortic valve stenosis.  7. The inferior vena cava is normal in size with greater than 50% respiratory variability, suggesting right atrial pressure of 3 mmHg. FINDINGS  Left Ventricle: Left ventricular ejection fraction, by estimation, is 55 to 60%. The left ventricle has normal function. The left ventricle has no regional wall motion abnormalities. Definity contrast agent was given IV to delineate the left ventricular  endocardial borders. The left  ventricular internal cavity size was normal in size. There is mild concentric left ventricular hypertrophy. Left ventricular diastolic parameters are indeterminate. Right Ventricle: The right ventricular size is normal. No increase in right ventricular wall thickness. Right ventricular systolic function is moderately reduced. Left Atrium: Left atrial size was severely dilated. Right Atrium: Right atrial size was normal in size. Pericardium: There is no evidence of pericardial effusion. Mitral Valve: The mitral valve is degenerative in appearance. Mild to moderate mitral annular calcification. Mild mitral valve regurgitation. No evidence of mitral valve stenosis. Tricuspid Valve: The tricuspid valve is normal in structure. Tricuspid valve regurgitation is mild to moderate. No evidence of tricuspid stenosis. Aortic Valve: The aortic valve is normal in structure. Aortic valve regurgitation is not visualized. Aortic valve sclerosis is present, with no evidence of aortic valve stenosis. Pulmonic Valve: The pulmonic valve was normal in structure. Pulmonic valve regurgitation is not visualized. No evidence of pulmonic stenosis. Aorta: The aortic root is normal in size and structure. Venous: The inferior vena cava is normal in size with greater than 50% respiratory variability, suggesting right atrial pressure of 3 mmHg. IAS/Shunts: No atrial level shunt detected by color flow Doppler.  LEFT VENTRICLE PLAX 2D LVIDd:         4.20 cm   Diastology LVIDs:         2.60 cm   LV e' medial:  6.96 cm/s LV PW:         1.00 cm   LV e' lateral: 7.29 cm/s LV IVS:        1.10 cm LVOT diam:     2.00 cm LV SV:         55 LV SV Index:   33 LVOT Area:     3.14 cm LV IVRT:       74 msec  RIGHT VENTRICLE            IVC RV S prime:     7.83 cm/s  IVC diam: 1.90 cm TAPSE (M-mode): 0.7 cm LEFT ATRIUM              Index        RIGHT ATRIUM           Index LA diam:        4.70 cm  2.85 cm/m   RA Area:     11.20 cm LA Vol (A2C):   115.0 ml 69.82  ml/m  RA Volume:   18.10 ml  10.99 ml/m LA Vol (A4C):   61.0 ml  37.03 ml/m LA Biplane Vol: 85.6 ml  51.97 ml/m  AORTIC VALVE LVOT Vmax:   83.50 cm/s LVOT Vmean:  56.400 cm/s LVOT VTI:    0.174 m  AORTA Ao Root diam: 2.80 cm Ao Asc diam:  3.10 cm TRICUSPID VALVE TR Peak grad:   25.2 mmHg TR Vmax:        251.00  cm/s  SHUNTS Systemic VTI:  0.17 m Systemic Diam: 2.00 cm Kardie Tobb DO Electronically signed by Dub Huntsman DO Signature Date/Time: 08/22/2024/1:06:15 PM    Final    DG CHEST PORT 1 VIEW Result Date: 08/21/2024 CLINICAL DATA:  Atrial fibrillation EXAM: PORTABLE CHEST 1 VIEW COMPARISON:  05/03/2017 FINDINGS: Heart and mediastinal contours are within normal limits. No focal opacities or effusions. No acute bony abnormality. IMPRESSION: No active disease. Electronically Signed   By: Franky Crease M.D.   On: 08/21/2024 19:29    No orders of the defined types were placed in this encounter.   CODE STATUS:  Code Status History     Date Active Date Inactive Code Status Order ID Comments User Context   08/21/2024 1658 08/22/2024 1914 Full Code [id number]  Jesus Oliphant, MD Inpatient   07/06/2024 1129 07/13/2024 2046 Full Code [id number]  Dennise Lavada POUR, MD ED   06/17/2024 1540 06/18/2024 1625 Full Code [id number]  Jesus Oliphant, MD Inpatient   12/21/2011 1452 12/24/2011 1530 Full Code [id number]  Ramonita Consuelo PARAS, RN Inpatient    Questions for Most Recent Historical Code Status (Order [id number])     Question Answer   By: Other            Future Appointments  Date Time Provider Department Center  09/20/2024 11:30 AM [facility] None  09/20/2024 12:30 PM [facility] NURSE [facility] None  09/20/2024  1:00 PM Izell Domino, MD [facility] None  10/07/2024  1:30 PM [facility] 10 [facility] H&V  10/07/2024  2:00 PM [facility] 10 [facility] H&V  10/07/2024  2:30 PM [facility] PA [facility] H&V  10/10/2024  9:30 AM [facility] LAB [facility] [address]  10/10/2024  1:30 PM Harden Jerona GAILS, MD [facility]  None  10/17/2024  2:20 PM Watt Mirza, MD [facility] [address]  11/05/2024  2:20 PM Daneen Damien BROCKS, NP [facility] H&V  05/02/2025 11:30 AM [facility] ANNUAL WELLNESS VISIT 1 [facility] [address]     I spent a total of 70 minutes during this encounter with the patient including review of chart and various tests results, discussions about plan of care and coordination of care plan.  This document was completed utilizing speech recognition software. Grammatical errors, random word insertions, pronoun errors, and incomplete sentences are an occasional consequence of this system due to software limitations, ambient noise, and hardware issues. Any formal questions or concerns about the content, text or information contained within the body of this dictation should be directly addressed to the provider for clarification.

## 2024-09-20 NOTE — Progress Notes (Signed)
 Dental Form with Estimates of Radiation Dose  Taylor Bush Date of birth: 12-27-1945      Diagnosis:    ICD-10-CM   1. 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 -----------------------------------  Lauraine Golden, MD

## 2024-09-20 NOTE — Progress Notes (Signed)
 Oncology Nurse Navigator Documentation   Met with patient during initial consult with Dr. Izell. She was accompanied by her daughter. Further introduced myself as her/their Navigator, explained my role as a member of the Care Team. Provided New Patient resource guide binder: Contact information for physicians, this navigator, other members of the Care Team Advance Directive information; provided [facility] AD booklet at their request,  Fall Prevention Patient Safety Plan Financial Assistance Information sheet Symptom Management Clinic information [facility] map with highlight of [facility] SLP Information sheet Head and Neck cancer basics Nutrition information Patient and family support information including Spiritual care/Chaplain information, Peer mentor program, health and wellness classes, and the survivorship program Community resources  Provided and discussed educational handouts for PEG and PAC. Assisted with post-consult appt scheduling. I contacted Dr. Nila office and she will see them on Monday 11/17 for evaluation.  PET and pulmonology consult ordered.   They verbalized understanding of information provided. I encouraged them to call with questions/concerns moving forward.  Delon Jefferson, RN, BSN, OCN Head & Neck Oncology Nurse Navigator [facility] [phone]

## 2024-09-24 ENCOUNTER — Ambulatory Visit: Admitting: Orthopedic Surgery

## 2024-09-24 ENCOUNTER — Encounter: Payer: Self-pay | Admitting: Oncology

## 2024-09-24 ENCOUNTER — Telehealth: Payer: Self-pay | Admitting: Radiation Oncology

## 2024-09-24 NOTE — Telephone Encounter (Addendum)
 11/18 Outgoing Referral faxed to [facility] - Pulmonary.  Follow up call to [facility] spoke to Laverne, referral received.  Will call back once patient is sch with date/time.

## 2024-09-24 NOTE — Assessment & Plan Note (Signed)
 Please review oncology history for additional details and timeline of events.  Post-surgical status following tongue cancer with cervical lymph node involvement and extracapsular extension.   Neck dissection on October 15th with removal of 33 lymph nodes, 3 with cancer. High risk of recurrence due to extracapsular extension.   CT chest on 09/18/2024 showed mild mediastinal lymphadenopathy and metastatic involvement could not be excluded.  8 mm anterior left upper lobe lung nodule noted as well.  Will obtain PET scan for further evaluation.  Request placed today.  Will consider pulmonology referral based on PET scan findings.    Provided no evidence of metastatic disease is noted, at the minimum radiation therapy recommended to target neck area. Chemotherapy considered supportive to enhance radiation efficacy. Cisplatin contraindicated due to chronic kidney disease. Alternatives include carboplatin/paclitaxel or docetaxel. Chemotherapy side effects include low blood counts, nausea, fatigue, electrolyte disturbances, neuropathy, and reversible hair loss.   Following PET scan findings, we will finalize treatment plan and arrange chemo education, Port-A-Cath placement accordingly.  Feeding tube will be arranged if patient is proceeding with concurrent chemoradiation.  In case of chest metastatic disease, we could consider single agent pembrolizumab therapy or pembrolizumab in combination with carboplatin/5-FU.  Final plan will be decided once above workup is complete.

## 2024-09-25 ENCOUNTER — Telehealth: Payer: Self-pay | Admitting: *Deleted

## 2024-09-25 DIAGNOSIS — E782 Mixed hyperlipidemia: Secondary | ICD-10-CM

## 2024-09-25 DIAGNOSIS — D508 Other iron deficiency anemias: Secondary | ICD-10-CM

## 2024-09-25 DIAGNOSIS — E538 Deficiency of other specified B group vitamins: Secondary | ICD-10-CM

## 2024-09-25 DIAGNOSIS — R7309 Other abnormal glucose: Secondary | ICD-10-CM

## 2024-09-25 DIAGNOSIS — R5383 Other fatigue: Secondary | ICD-10-CM

## 2024-09-25 DIAGNOSIS — E559 Vitamin D deficiency, unspecified: Secondary | ICD-10-CM

## 2024-09-25 NOTE — Telephone Encounter (Signed)
-----   Message from Taylor Bush sent at 09/25/2024  2:20 PM EST ----- Regarding: Labs Thurs 10/10/24 Hello,  Patient is coming in for CPE labs. Can we get orders please.   Thanks

## 2024-09-26 ENCOUNTER — Ambulatory Visit (INDEPENDENT_AMBULATORY_CARE_PROVIDER_SITE_OTHER)

## 2024-09-26 VITALS — BP 122/81 | HR 69 | Temp 97.4°F | Ht 60.0 in | Wt 142.0 lb

## 2024-09-26 DIAGNOSIS — R911 Solitary pulmonary nodule: Secondary | ICD-10-CM

## 2024-09-26 DIAGNOSIS — J90 Pleural effusion, not elsewhere classified: Secondary | ICD-10-CM | POA: Diagnosis not present

## 2024-09-26 DIAGNOSIS — C023 Malignant neoplasm of anterior two-thirds of tongue, part unspecified: Secondary | ICD-10-CM

## 2024-09-26 DIAGNOSIS — R590 Localized enlarged lymph nodes: Secondary | ICD-10-CM | POA: Diagnosis not present

## 2024-09-26 DIAGNOSIS — Z7901 Long term (current) use of anticoagulants: Secondary | ICD-10-CM

## 2024-09-26 NOTE — Assessment & Plan Note (Addendum)
 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  Also has a small nodule on left upper lobe Tentative plans for radiation therapy for left tongue and bilateral cervical lymph nodes.  However if mediastinal lymph nodes are positive for malignancy, she would be a candidate for systemic therapy alone Discussed this with the patient PET/CT is scheduled for next week.  I will follow the results and depending on findings, will plan for bronchoscopy- endobronchial ultrasound for mediastinal lymph node aspiration.  biopsy of the small left upper lobe through navigational/robotic bronchoscopy may be difficult given small size. I did bedside ultrasound on small right sided pleural effusion, was too small with respiration for safe drainage.  I explained the bronchoscopic biopsy procedure at depth.  We discussed about other potential options including continued follow up, CT guided biopsy, surgical biopsy. Risks and benefits of bronchoscopy +/- biopsy discussed with the patient and his family in details and all the questions were answered. They understand the risk of bleeding, pneumothorax, injury to blood vessels and would like to proceed for the bronchoscopy with biopsy if deemed necessary

## 2024-09-26 NOTE — Progress Notes (Signed)
 New Patient Pulmonology Office Visit   Subjective:  Patient ID: Taylor Bush, female    DOB: [birth year]  MRN: [id number]  Referred by: Izell Domino, MD  CC:  Chief Complaint  Patient presents with   Consult    Abnormal ct chest. Patient denies any breathing issue.    HPI Taylor Bush is a 78 y.o. 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  Discussed the use of AI scribe software for clinical note transcription with the patient, who gave verbal consent to proceed.  History of Present Illness Taylor Bush is a 78 year old female with tongue cancer (bx 09/2023) who presents for evaluation of cancer spread and associated symptoms.  In 2024 she was diagnosed with cancer in her tongue after having a couple of spots removed, one of which was found to be cancerous.  She has history of recurrent oral leukoplakia.. She has seen a radiation doctor and is awaiting a PET scan to determine the extent of the cancer spread. A CT scan of her chest has shown enlarged lymph nodes and a small fluid pocket on the right side of her lung. No trouble swallowing or breathing.  She has a history of atrial fibrillation, which was discovered during a lymph node surgery when her heart went out of rhythm. She is currently taking Eliquis for this condition and also takes metoprolol  for heart rate management. No shortness of breath and she is able to perform daily activities such as cooking and cleaning.  She experiences swelling in her legs, particularly her foot, which worsens with activity and improves with rest. She had stents placed in her foot due to closed veins, and the swelling has persisted since then. Both legs are  swollen, with the left  being more affected. She has hx of CKD  She has a history of smoking, which she quit years ago. She no longer drives due to vision issues, and her son assists with transportation to appointments. Her son is on dialysis        ROS Review of symptoms negative except mentioned above   Allergies: Prednisone and Statins  Current Outpatient Medications:    amLODipine  (NORVASC ) 10 MG tablet, TAKE 1 TABLET BY MOUTH DAILY, Disp: 100 tablet, Rfl: 0   apixaban (ELIQUIS) 5 MG TABS tablet, Take 1 tablet (5 mg total) by mouth 2 (two) times daily., Disp: 60 tablet, Rfl: 3   clopidogrel  (PLAVIX ) 75 MG tablet, Take 1 tablet (75 mg total) by mouth daily with breakfast., Disp: 30 tablet, Rfl: 3   HYDROcodone -acetaminophen  (NORCO/VICODIN) 5-325 MG tablet, Take 1 tablet by mouth every 6 (six) hours as needed for moderate pain (pain score 4-6)., Disp: 16 tablet, Rfl: 0   lisinopril  (ZESTRIL ) 20 MG tablet, TAKE 1 TABLET BY MOUTH DAILY, Disp: 100 tablet, Rfl: 0   metoprolol  (TOPROL -XL) 200 MG 24 hr tablet, TAKE 1 TABLET BY MOUTH DAILY, Disp: 100 tablet, Rfl: 0   ondansetron  (ZOFRAN -ODT) 4 MG disintegrating tablet, Take 1 tablet (4 mg total) by mouth every 8 (eight) hours as needed for nausea or vomiting., Disp: 20 tablet, Rfl: 0   traMADol  (ULTRAM ) 50 MG tablet, Take 1 tablet (50 mg total) by mouth every 8 (eight) hours as needed., Disp:  40 tablet, Rfl: 2   atorvastatin  (LIPITOR ) 80 MG tablet, Take 1 tablet (80 mg total) by mouth daily. (Patient not taking: Reported on 09/26/2024), Disp: 30 tablet, Rfl: 3   Faricimab -svoa (VABYSMO  IZ), 1 Dose by Intravitreal route every 6 (six) weeks. (Patient not taking: Reported on 09/26/2024), Disp: , Rfl:    mupirocin  ointment (BACTROBAN ) 2 %, Place 1 Application into the nose 2 (two) times daily. (Patient not taking: Reported on 09/26/2024), Disp: 22 g, Rfl: 0   nitroGLYCERIN  (NITRODUR - DOSED IN MG/24 HR) 0.2 mg/hr patch, Place 1 patch (0.2 mg  total) onto the skin daily. (Patient not taking: Reported on 09/26/2024), Disp: 30 patch, Rfl: 12   ondansetron  (ZOFRAN -ODT) 4 MG disintegrating tablet, Take 1 tablet (4 mg total) by mouth every 8 (eight) hours as needed for nausea or vomiting. (Patient not taking: Reported on 09/26/2024), Disp: 20 tablet, Rfl: 0   senna-docusate (SENOKOT-S) 8.6-50 MG tablet, Take 1 tablet by mouth at bedtime as needed for mild constipation. (Patient not taking: Reported on 09/26/2024), Disp: , Rfl:  Past Medical History:  Diagnosis Date   Allergic rhinitis    Allergy    Anemia in chronic kidney disease (CKD)    Arthritis    B12 deficiency 09/13/2021   Blood transfusion 2012   and 2013   Blood transfusion without reported diagnosis    Cataract    bilateral sx   Chronic low back pain    with radiculopathy   CKD (chronic kidney disease) stage 3, GFR 30-59 ml/min (HCC) 11/29/2013   HLD (hyperlipidemia)    HTN (hypertension)    Hx of total hip arthroplasty 12/15/2011   Open-angle glaucoma    PAD (peripheral artery disease)    with intermittent claudication, ABI 0.72 on Rt and 0.73 on L when assessed in 2010.    Past Surgical History:  Procedure Laterality Date   ABDOMINAL AORTOGRAM W/LOWER EXTREMITY N/A 07/09/2024   Procedure: ABDOMINAL AORTOGRAM W/LOWER EXTREMITY;  Surgeon: Serene Gaile ORN, MD;  Location: [facility];  Service: Cardiovascular;  Laterality: N/A;   abnormal vascular studies  05/07/2009   ABI's 70, R SFA > 50%^ blockage    APPLICATION OF WOUND VAC Left 07/12/2024   Procedure: APPLICATION, WOUND VAC;  Surgeon: Harden Jerona GAILS, MD;  Location: [facility];  Service: Orthopedics;  Laterality: Left;   CATARACT EXTRACTION, BILATERAL  2011   EXCISION OF TONGUE LESION WITH LASER N/A 08/31/2015   Procedure: BIOPSY AND CO2 LASER OF TONGUE LESION ;  Surgeon: Ida Loader, MD;  Location: [facility];  Service: ENT;  Laterality: N/A;   EXCISION OF TONGUE LESION WITH LASER Left 06/29/2020    Procedure: EXCISION OF TONGUE LESION/CO2 laser w/biopsy;  Surgeon: Loader Ida, MD;  Location: [facility];  Service: ENT;  Laterality: Left;   EXCISION OF TONGUE LESION WITH LASER Bilateral 10/02/2023   Procedure: ORAL BIOPSY AND CO2 LASER ABLATATION OF TONGUE LESION;  Surgeon: Loader Ida, MD;  Location: [facility];  Service: ENT;  Laterality: Bilateral;   EXCISION ORAL LESION WITH CO2 LASER N/A 12/08/2021   Procedure: EXCISION OF ORAL ORAL CAVITY LESION WITH CO2 LASER;  Surgeon: Loader Ida, MD;  Location: [facility];  Service: ENT;  Laterality: N/A;   EYE SURGERY     GLOSSECTOMY, PARTIAL Left 06/17/2024   Procedure: GLOSSECTOMY, PARTIAL;  Surgeon: Loader Ida, MD;  Location: [facility] OR;  Service: ENT;  Laterality: Left;   INCISION AND DRAINAGE OF WOUND Left  07/12/2024   Procedure: IRRIGATION AND DEBRIDEMENT WOUND;  Surgeon: Harden Jerona GAILS, MD;  Location[facility];  Service: Orthopedics;  Laterality: Left;  LEFT FOOT DEBRIDEMENT   JOINT REPLACEMENT     LOWER EXTREMITY ANGIOGRAPHY  07/09/2024   Procedure: Lower Extremity Angiography;  Surgeon: Serene Gaile ORN, MD;  Location: [facility];  Service: Cardiovascular;;   LOWER EXTREMITY INTERVENTION  07/09/2024   Procedure: LOWER EXTREMITY INTERVENTION;  Surgeon: Serene Gaile ORN, MD;  Location: [facility];  Service: Cardiovascular;;   OTHER SURGICAL HISTORY     ganglion cyst removed left wrist    PERIPHERAL INTRAVASCULAR LITHOTRIPSY  07/09/2024   Procedure: PERIPHERAL INTRAVASCULAR LITHOTRIPSY;  Surgeon: Serene Gaile ORN, MD;  Location: [facility];  Service: Cardiovascular;;   PERIPHERAL VASCULAR ATHERECTOMY  07/09/2024   Procedure: PERIPHERAL VASCULAR ATHERECTOMY;  Surgeon: Serene Gaile ORN, MD;  Location: [facility];  Service: Cardiovascular;;   RADICAL NECK DISSECTION Left 08/21/2024   Procedure: DISSECTION, NECK, RADICAL;  Surgeon: Jesus Oliphant, MD;  Location: [facility] OR;  Service: ENT;  Laterality:  Left;   TOTAL HIP ARTHROPLASTY  10/03/2011   Procedure: TOTAL HIP ARTHROPLASTY;  Surgeon: Dempsey GAILS Moan;  Location: [facility];  Service: Orthopedics;  Laterality: Right;   TOTAL HIP ARTHROPLASTY  12/21/2011   Procedure: TOTAL HIP ARTHROPLASTY;  Surgeon: Dempsey GAILS Moan, MD;  Location: [facility];  Service: Orthopedics;  Laterality: Left;   TUBAL LIGATION     Family History  Problem Relation Age of Onset   Colon polyps Mother 57   Heart attack Father        27s   Stroke Neg Hx        also no PAD or aneurysm   Colon cancer Neg Hx    Esophageal cancer Neg Hx    Stomach cancer Neg Hx    Rectal cancer Neg Hx    Social History   Socioeconomic History   Marital status: Widowed    Spouse name: Not on file   Number of children: Not on file   Years of education: Not on file   Highest education level: Not on file  Occupational History   Not on file  Tobacco Use   Smoking status: Former    Current packs/day: 0.00    Types: Cigarettes    Quit date: 11/07/2008    Years since quitting: 15.8   Smokeless tobacco: Never  Vaping Use   Vaping status: Never Used  Substance and Sexual Activity   Alcohol  use: No   Drug use: No   Sexual activity: Not on file  Other Topics Concern   Not on file  Social History Narrative   Retired haematologist, 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    Social Drivers of Corporate Investment Banker Strain: Low Risk  (04/29/2024)   Overall Financial Resource Strain (CARDIA)    Difficulty of Paying Living Expenses: Not hard at all  Food Insecurity: No Food Insecurity (09/20/2024)   Hunger Vital Sign    Worried About Running Out of Food in the Last Year: Never true    Ran Out of Food in the Last Year: Never true  Transportation Needs: No Transportation Needs (09/20/2024)   PRAPARE - Administrator, Civil Service (Medical): No    Lack of Transportation (Non-Medical): No  Physical Activity: Insufficiently Active (04/29/2024)   Exercise Vital Sign    Days of  Exercise per Week: 7 days    Minutes  of Exercise per Session: 20 min  Stress: No Stress Concern Present (04/29/2024)   [employer] of Occupational Health - Occupational Stress Questionnaire    Feeling of Stress: Not at all  Social Connections: Moderately Isolated (08/21/2024)   Social Connection and Isolation Panel    Frequency of Communication with Friends and Family: More than three times a week    Frequency of Social Gatherings with Friends and Family: More than three times a week    Attends Religious Services: More than 4 times per year    Active Member of [employer] or Organizations: No    Attends Banker Meetings: Never    Marital Status: Widowed  Intimate Partner Violence: Not At Risk (09/20/2024)   Humiliation, Afraid, Rape, and Kick questionnaire    Fear of Current or Ex-Partner: No    Emotionally Abused: No    Physically Abused: No    Sexually Abused: No         Objective:  BP 122/81   Pulse 69   Temp (!) 97.4 F (36.3 C) (Oral)   Ht 5' (1.524 m)   Wt 142 lb (64.4 kg)   SpO2 97%   BMI 27.73 kg/m    Physical Exam Constitutional:      General: She is not in acute distress.    Appearance: Normal appearance.  HENT:     Mouth/Throat:     Mouth: Mucous membranes are moist.  Cardiovascular:     Rate and Rhythm: Normal rate.  Pulmonary:     Effort: No respiratory distress.     Breath sounds: No wheezing or rales.  Musculoskeletal:     Right lower leg: Edema present.     Left lower leg: Edema (left foot on a partial boot) present.  Skin:    General: Skin is warm.  Neurological:     Mental Status: She is alert and oriented to person, place, and time.  Psychiatric:        Mood and Affect: Mood normal.     Diagnostic Review:    Pft     No data to display               Results RADIOLOGY Chest CT: Mediastinal lymphadenopathy, small pleural effusion on the right side, and a pulmonary nodule on the left side. (09/19/2024)   Soft  tissue neck CT with contrast on 09/18/24 which demonstrated: a prominent heterogeneous left level 3/4 cervical lymph node measuring 1.0 x 1.2 cm with central hypoattenuation concerning for possible central necrosis; subtle asymmetric soft tissue at the left posterior tongue (likely related to recent surgical changes); and postsurgical changes of the left neck with presumed surgical absence of the left submandibular gland.  -- CT of the abdomen on 09/18/24 showed no evidence of metastatic disease in the abdomen or acute findings overall.     Echo 08/2024 1. Left ventricular ejection fraction, by estimation, is 55 to 60%. The  left ventricle has normal function. The left ventricle has no regional  wall motion abnormalities. There is mild concentric left ventricular  hypertrophy. Left ventricular diastolic  parameters are indeterminate.   2. Right ventricular systolic function is moderately reduced. The right  ventricular size is normal.   3. Left atrial size was severely dilated.   4. The mitral valve is degenerative. Mild mitral valve regurgitation. No  evidence of mitral stenosis.   5. Tricuspid valve regurgitation is mild to moderate.   6. The aortic valve is normal in structure. Aortic valve  regurgitation is  not visualized. Aortic valve sclerosis is present, with no evidence of  aortic valve stenosis.   7. The inferior vena cava is normal in size with greater than 50%  respiratory variability, suggesting right atrial pressure of 3 mmHg.     Path reports from Nov 03/2024 A. LEFT MODIFIED NECK DISSECTION, LEVELS I, II, III:  Three of thirty lymph nodes with metastatic well to moderately  differentiated squamous cell carcinoma (3/30, pN2b)  Largest involved node: 1.9 cm level II lymph node  Benign submandibular and minor salivary gland    Assessment & Plan:   Assessment & Plan 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) Oral tongue squamous cell carcinoma, left side,  Stage IVB with cervical lymph node involvement who is found to have mediastinal lymphadenopathy and right-sided pleural effusion on recent CT chest.  Also has a small nodule on left upper lobe Tentative plans for radiation therapy for left tongue and bilateral cervical lymph nodes.  However if mediastinal lymph nodes are positive for malignancy, she would be a candidate for systemic therapy alone Discussed this with the patient PET/CT is scheduled for next week.  I will follow the results and depending on findings, will plan for bronchoscopy- endobronchial ultrasound for mediastinal lymph node aspiration.  biopsy of the small left upper lobe through navigational/robotic bronchoscopy may be difficult given small size. I did bedside ultrasound on small right sided pleural effusion, was too small with respiration for safe drainage.  I explained the bronchoscopic biopsy procedure at depth.  We discussed about other potential options including continued follow up, CT guided biopsy, surgical biopsy. Risks and benefits of bronchoscopy +/- biopsy discussed with the patient and his family in details and all the questions were answered. They understand the risk of bleeding, pneumothorax, injury to blood vessels and would like to proceed for the bronchoscopy with biopsy if deemed necessary    Pleural effusion, right Differentials include congestive heart failure, given recent history of A-fib and bilateral pedal edema Metastatic effusion is another possibility. I have advised patient to consult us  if her shortness of breath gets worse. I did bedside ultrasound on small right sided pleural effusion, was too small with respiration for safe drainage at this time    Nodule of left lung Small on CT chest.  Will follow and subsequent scans    Mediastinal lymphadenopathy Differentials include congestive lymphadenopathy versus metastatic involvement    On anticoagulant therapy Will need to be stopped for any  procedures in future Advised her to continue Eliquis  for      Thank you for the opportunity to take part in the care of Taylor Bush   Return in about 25 days (around 10/21/2024).   Luisfernando Brightwell Pleas, MD [facility] Office: [phone]  Extensive review of oncologic history, images, bedside ultrasound and discussion/consent for bronchoscopy- total clinic time 60 min

## 2024-09-26 NOTE — Patient Instructions (Signed)
 It was a pleasure to see you today. Your will receive a call regarding bronchoscopy once we have PET/CT results

## 2024-09-26 NOTE — H&P (View-Only) (Signed)
 New Patient Pulmonology Office Visit   Subjective:  Patient ID: Taylor Bush, female    DOB: [birth year]  MRN: [id number]  Referred by: Izell Domino, MD  CC:  Chief Complaint  Patient presents with   Consult    Abnormal ct chest. Patient denies any breathing issue.    HPI Taylor Bush is a 78 y.o. 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  Discussed the use of AI scribe software for clinical note transcription with the patient, who gave verbal consent to proceed.  History of Present Illness Taylor Bush is a 78 year old female with tongue cancer (bx 09/2023) who presents for evaluation of cancer spread and associated symptoms.  In 2024 she was diagnosed with cancer in her tongue after having a couple of spots removed, one of which was found to be cancerous.  She has history of recurrent oral leukoplakia.. She has seen a radiation doctor and is awaiting a PET scan to determine the extent of the cancer spread. A CT scan of her chest has shown enlarged lymph nodes and a small fluid pocket on the right side of her lung. No trouble swallowing or breathing.  She has a history of atrial fibrillation, which was discovered during a lymph node surgery when her heart went out of rhythm. She is currently taking Eliquis for this condition and also takes metoprolol  for heart rate management. No shortness of breath and she is able to perform daily activities such as cooking and cleaning.  She experiences swelling in her legs, particularly her foot, which worsens with activity and improves with rest. She had stents placed in her foot due to closed veins, and the swelling has persisted since then. Both legs are  swollen, with the left  being more affected. She has hx of CKD  She has a history of smoking, which she quit years ago. She no longer drives due to vision issues, and her son assists with transportation to appointments. Her son is on dialysis        ROS Review of symptoms negative except mentioned above   Allergies: Prednisone and Statins  Current Outpatient Medications:    amLODipine  (NORVASC ) 10 MG tablet, TAKE 1 TABLET BY MOUTH DAILY, Disp: 100 tablet, Rfl: 0   apixaban (ELIQUIS) 5 MG TABS tablet, Take 1 tablet (5 mg total) by mouth 2 (two) times daily., Disp: 60 tablet, Rfl: 3   clopidogrel  (PLAVIX ) 75 MG tablet, Take 1 tablet (75 mg total) by mouth daily with breakfast., Disp: 30 tablet, Rfl: 3   HYDROcodone -acetaminophen  (NORCO/VICODIN) 5-325 MG tablet, Take 1 tablet by mouth every 6 (six) hours as needed for moderate pain (pain score 4-6)., Disp: 16 tablet, Rfl: 0   lisinopril  (ZESTRIL ) 20 MG tablet, TAKE 1 TABLET BY MOUTH DAILY, Disp: 100 tablet, Rfl: 0   metoprolol  (TOPROL -XL) 200 MG 24 hr tablet, TAKE 1 TABLET BY MOUTH DAILY, Disp: 100 tablet, Rfl: 0   ondansetron  (ZOFRAN -ODT) 4 MG disintegrating tablet, Take 1 tablet (4 mg total) by mouth every 8 (eight) hours as needed for nausea or vomiting., Disp: 20 tablet, Rfl: 0   traMADol  (ULTRAM ) 50 MG tablet, Take 1 tablet (50 mg total) by mouth every 8 (eight) hours as needed., Disp:  40 tablet, Rfl: 2   atorvastatin  (LIPITOR ) 80 MG tablet, Take 1 tablet (80 mg total) by mouth daily. (Patient not taking: Reported on 09/26/2024), Disp: 30 tablet, Rfl: 3   Faricimab -svoa (VABYSMO  IZ), 1 Dose by Intravitreal route every 6 (six) weeks. (Patient not taking: Reported on 09/26/2024), Disp: , Rfl:    mupirocin  ointment (BACTROBAN ) 2 %, Place 1 Application into the nose 2 (two) times daily. (Patient not taking: Reported on 09/26/2024), Disp: 22 g, Rfl: 0   nitroGLYCERIN  (NITRODUR - DOSED IN MG/24 HR) 0.2 mg/hr patch, Place 1 patch (0.2 mg  total) onto the skin daily. (Patient not taking: Reported on 09/26/2024), Disp: 30 patch, Rfl: 12   ondansetron  (ZOFRAN -ODT) 4 MG disintegrating tablet, Take 1 tablet (4 mg total) by mouth every 8 (eight) hours as needed for nausea or vomiting. (Patient not taking: Reported on 09/26/2024), Disp: 20 tablet, Rfl: 0   senna-docusate (SENOKOT-S) 8.6-50 MG tablet, Take 1 tablet by mouth at bedtime as needed for mild constipation. (Patient not taking: Reported on 09/26/2024), Disp: , Rfl:  Past Medical History:  Diagnosis Date   Allergic rhinitis    Allergy    Anemia in chronic kidney disease (CKD)    Arthritis    B12 deficiency 09/13/2021   Blood transfusion 2012   and 2013   Blood transfusion without reported diagnosis    Cataract    bilateral sx   Chronic low back pain    with radiculopathy   CKD (chronic kidney disease) stage 3, GFR 30-59 ml/min (HCC) 11/29/2013   HLD (hyperlipidemia)    HTN (hypertension)    Hx of total hip arthroplasty 12/15/2011   Open-angle glaucoma    PAD (peripheral artery disease)    with intermittent claudication, ABI 0.72 on Rt and 0.73 on L when assessed in 2010.    Past Surgical History:  Procedure Laterality Date   ABDOMINAL AORTOGRAM W/LOWER EXTREMITY N/A 07/09/2024   Procedure: ABDOMINAL AORTOGRAM W/LOWER EXTREMITY;  Surgeon: Serene Gaile ORN, MD;  Location: [facility];  Service: Cardiovascular;  Laterality: N/A;   abnormal vascular studies  05/07/2009   ABI's 70, R SFA > 50%^ blockage    APPLICATION OF WOUND VAC Left 07/12/2024   Procedure: APPLICATION, WOUND VAC;  Surgeon: Harden Jerona GAILS, MD;  Location: [facility];  Service: Orthopedics;  Laterality: Left;   CATARACT EXTRACTION, BILATERAL  2011   EXCISION OF TONGUE LESION WITH LASER N/A 08/31/2015   Procedure: BIOPSY AND CO2 LASER OF TONGUE LESION ;  Surgeon: Ida Loader, MD;  Location: [facility];  Service: ENT;  Laterality: N/A;   EXCISION OF TONGUE LESION WITH LASER Left 06/29/2020    Procedure: EXCISION OF TONGUE LESION/CO2 laser w/biopsy;  Surgeon: Loader Ida, MD;  Location: [facility];  Service: ENT;  Laterality: Left;   EXCISION OF TONGUE LESION WITH LASER Bilateral 10/02/2023   Procedure: ORAL BIOPSY AND CO2 LASER ABLATATION OF TONGUE LESION;  Surgeon: Loader Ida, MD;  Location: [facility];  Service: ENT;  Laterality: Bilateral;   EXCISION ORAL LESION WITH CO2 LASER N/A 12/08/2021   Procedure: EXCISION OF ORAL ORAL CAVITY LESION WITH CO2 LASER;  Surgeon: Loader Ida, MD;  Location: [facility];  Service: ENT;  Laterality: N/A;   EYE SURGERY     GLOSSECTOMY, PARTIAL Left 06/17/2024   Procedure: GLOSSECTOMY, PARTIAL;  Surgeon: Loader Ida, MD;  Location: [facility] OR;  Service: ENT;  Laterality: Left;   INCISION AND DRAINAGE OF WOUND Left  07/12/2024   Procedure: IRRIGATION AND DEBRIDEMENT WOUND;  Surgeon: Harden Jerona GAILS, MD;  Location[facility] OR;  Service: Orthopedics;  Laterality: Left;  LEFT FOOT DEBRIDEMENT   JOINT REPLACEMENT     LOWER EXTREMITY ANGIOGRAPHY  07/09/2024   Procedure: Lower Extremity Angiography;  Surgeon: Serene Gaile ORN, MD;  Location: [facility];  Service: Cardiovascular;;   LOWER EXTREMITY INTERVENTION  07/09/2024   Procedure: LOWER EXTREMITY INTERVENTION;  Surgeon: Serene Gaile ORN, MD;  Location: [facility];  Service: Cardiovascular;;   OTHER SURGICAL HISTORY     ganglion cyst removed left wrist    PERIPHERAL INTRAVASCULAR LITHOTRIPSY  07/09/2024   Procedure: PERIPHERAL INTRAVASCULAR LITHOTRIPSY;  Surgeon: Serene Gaile ORN, MD;  Location: [facility];  Service: Cardiovascular;;   PERIPHERAL VASCULAR ATHERECTOMY  07/09/2024   Procedure: PERIPHERAL VASCULAR ATHERECTOMY;  Surgeon: Serene Gaile ORN, MD;  Location: [facility];  Service: Cardiovascular;;   RADICAL NECK DISSECTION Left 08/21/2024   Procedure: DISSECTION, NECK, RADICAL;  Surgeon: Jesus Oliphant, MD;  Location: [facility] OR;  Service: ENT;  Laterality:  Left;   TOTAL HIP ARTHROPLASTY  10/03/2011   Procedure: TOTAL HIP ARTHROPLASTY;  Surgeon: Dempsey GAILS Moan;  Location: [facility];  Service: Orthopedics;  Laterality: Right;   TOTAL HIP ARTHROPLASTY  12/21/2011   Procedure: TOTAL HIP ARTHROPLASTY;  Surgeon: Dempsey GAILS Moan, MD;  Location: [facility];  Service: Orthopedics;  Laterality: Left;   TUBAL LIGATION     Family History  Problem Relation Age of Onset   Colon polyps Mother 57   Heart attack Father        27s   Stroke Neg Hx        also no PAD or aneurysm   Colon cancer Neg Hx    Esophageal cancer Neg Hx    Stomach cancer Neg Hx    Rectal cancer Neg Hx    Social History   Socioeconomic History   Marital status: Widowed    Spouse name: Not on file   Number of children: Not on file   Years of education: Not on file   Highest education level: Not on file  Occupational History   Not on file  Tobacco Use   Smoking status: Former    Current packs/day: 0.00    Types: Cigarettes    Quit date: 11/07/2008    Years since quitting: 15.8   Smokeless tobacco: Never  Vaping Use   Vaping status: Never Used  Substance and Sexual Activity   Alcohol  use: No   Drug use: No   Sexual activity: Not on file  Other Topics Concern   Not on file  Social History Narrative   Retired haematologist, 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    Social Drivers of Corporate Investment Banker Strain: Low Risk  (04/29/2024)   Overall Financial Resource Strain (CARDIA)    Difficulty of Paying Living Expenses: Not hard at all  Food Insecurity: No Food Insecurity (09/20/2024)   Hunger Vital Sign    Worried About Running Out of Food in the Last Year: Never true    Ran Out of Food in the Last Year: Never true  Transportation Needs: No Transportation Needs (09/20/2024)   PRAPARE - Administrator, Civil Service (Medical): No    Lack of Transportation (Non-Medical): No  Physical Activity: Insufficiently Active (04/29/2024)   Exercise Vital Sign    Days of  Exercise per Week: 7 days    Minutes  of Exercise per Session: 20 min  Stress: No Stress Concern Present (04/29/2024)   [employer] of Occupational Health - Occupational Stress Questionnaire    Feeling of Stress: Not at all  Social Connections: Moderately Isolated (08/21/2024)   Social Connection and Isolation Panel    Frequency of Communication with Friends and Family: More than three times a week    Frequency of Social Gatherings with Friends and Family: More than three times a week    Attends Religious Services: More than 4 times per year    Active Member of [employer] or Organizations: No    Attends Banker Meetings: Never    Marital Status: Widowed  Intimate Partner Violence: Not At Risk (09/20/2024)   Humiliation, Afraid, Rape, and Kick questionnaire    Fear of Current or Ex-Partner: No    Emotionally Abused: No    Physically Abused: No    Sexually Abused: No         Objective:  BP 122/81   Pulse 69   Temp (!) 97.4 F (36.3 C) (Oral)   Ht 5' (1.524 m)   Wt 142 lb (64.4 kg)   SpO2 97%   BMI 27.73 kg/m    Physical Exam Constitutional:      General: She is not in acute distress.    Appearance: Normal appearance.  HENT:     Mouth/Throat:     Mouth: Mucous membranes are moist.  Cardiovascular:     Rate and Rhythm: Normal rate.  Pulmonary:     Effort: No respiratory distress.     Breath sounds: No wheezing or rales.  Musculoskeletal:     Right lower leg: Edema present.     Left lower leg: Edema (left foot on a partial boot) present.  Skin:    General: Skin is warm.  Neurological:     Mental Status: She is alert and oriented to person, place, and time.  Psychiatric:        Mood and Affect: Mood normal.     Diagnostic Review:    Pft     No data to display               Results RADIOLOGY Chest CT: Mediastinal lymphadenopathy, small pleural effusion on the right side, and a pulmonary nodule on the left side. (09/19/2024)   Soft  tissue neck CT with contrast on 09/18/24 which demonstrated: a prominent heterogeneous left level 3/4 cervical lymph node measuring 1.0 x 1.2 cm with central hypoattenuation concerning for possible central necrosis; subtle asymmetric soft tissue at the left posterior tongue (likely related to recent surgical changes); and postsurgical changes of the left neck with presumed surgical absence of the left submandibular gland.  -- CT of the abdomen on 09/18/24 showed no evidence of metastatic disease in the abdomen or acute findings overall.     Echo 08/2024 1. Left ventricular ejection fraction, by estimation, is 55 to 60%. The  left ventricle has normal function. The left ventricle has no regional  wall motion abnormalities. There is mild concentric left ventricular  hypertrophy. Left ventricular diastolic  parameters are indeterminate.   2. Right ventricular systolic function is moderately reduced. The right  ventricular size is normal.   3. Left atrial size was severely dilated.   4. The mitral valve is degenerative. Mild mitral valve regurgitation. No  evidence of mitral stenosis.   5. Tricuspid valve regurgitation is mild to moderate.   6. The aortic valve is normal in structure. Aortic valve  regurgitation is  not visualized. Aortic valve sclerosis is present, with no evidence of  aortic valve stenosis.   7. The inferior vena cava is normal in size with greater than 50%  respiratory variability, suggesting right atrial pressure of 3 mmHg.     Path reports from Nov 03/2024 A. LEFT MODIFIED NECK DISSECTION, LEVELS I, II, III:  Three of thirty lymph nodes with metastatic well to moderately  differentiated squamous cell carcinoma (3/30, pN2b)  Largest involved node: 1.9 cm level II lymph node  Benign submandibular and minor salivary gland    Assessment & Plan:   Assessment & Plan 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) Oral tongue squamous cell carcinoma, left side,  Stage IVB with cervical lymph node involvement who is found to have mediastinal lymphadenopathy and right-sided pleural effusion on recent CT chest.  Also has a small nodule on left upper lobe Tentative plans for radiation therapy for left tongue and bilateral cervical lymph nodes.  However if mediastinal lymph nodes are positive for malignancy, she would be a candidate for systemic therapy alone Discussed this with the patient PET/CT is scheduled for next week.  I will follow the results and depending on findings, will plan for bronchoscopy- endobronchial ultrasound for mediastinal lymph node aspiration.  biopsy of the small left upper lobe through navigational/robotic bronchoscopy may be difficult given small size. I did bedside ultrasound on small right sided pleural effusion, was too small with respiration for safe drainage.  I explained the bronchoscopic biopsy procedure at depth.  We discussed about other potential options including continued follow up, CT guided biopsy, surgical biopsy. Risks and benefits of bronchoscopy +/- biopsy discussed with the patient and his family in details and all the questions were answered. They understand the risk of bleeding, pneumothorax, injury to blood vessels and would like to proceed for the bronchoscopy with biopsy if deemed necessary    Pleural effusion, right Differentials include congestive heart failure, given recent history of A-fib and bilateral pedal edema Metastatic effusion is another possibility. I have advised patient to consult us  if her shortness of breath gets worse. I did bedside ultrasound on small right sided pleural effusion, was too small with respiration for safe drainage at this time    Nodule of left lung Small on CT chest.  Will follow and subsequent scans    Mediastinal lymphadenopathy Differentials include congestive lymphadenopathy versus metastatic involvement    On anticoagulant therapy Will need to be stopped for any  procedures in future Advised her to continue Eliquis  for      Thank you for the opportunity to take part in the care of Taylor Bush   Return in about 25 days (around 10/21/2024).   Luisfernando Brightwell Pleas, MD [facility] Office: [phone]  Extensive review of oncologic history, images, bedside ultrasound and discussion/consent for bronchoscopy- total clinic time 60 min

## 2024-10-01 ENCOUNTER — Ambulatory Visit (HOSPITAL_COMMUNITY)
Admission: RE | Admit: 2024-10-01 | Discharge: 2024-10-01 | Disposition: A | Discharge status: Home / Self Care | Source: Ambulatory Visit | Attending: Oncology | Admitting: Oncology

## 2024-10-01 DIAGNOSIS — C029 Malignant neoplasm of tongue, unspecified: Secondary | ICD-10-CM | POA: Insufficient documentation

## 2024-10-01 LAB — GLUCOSE, CAPILLARY: Glucose-Capillary: 109 mg/dL — ABNORMAL HIGH (ref 70–99)

## 2024-10-01 MED ORDER — FLUDEOXYGLUCOSE F - 18 (FDG) INJECTION
7.1000 | Freq: Once | INTRAVENOUS | Status: AC
  Administered 2024-10-01: 7.1 via INTRAVENOUS

## 2024-10-07 ENCOUNTER — Ambulatory Visit (HOSPITAL_COMMUNITY)
Admission: RE | Admit: 2024-10-07 | Discharge: 2024-10-07 | Disposition: A | Discharge status: Home / Self Care | Source: Ambulatory Visit | Attending: Surgery | Admitting: Surgery

## 2024-10-07 ENCOUNTER — Ambulatory Visit: Admitting: Orthopedic Surgery

## 2024-10-07 ENCOUNTER — Ambulatory Visit: Admitting: Physician Assistant

## 2024-10-07 ENCOUNTER — Encounter: Payer: Self-pay | Admitting: Emergency Medicine

## 2024-10-07 ENCOUNTER — Telehealth: Payer: Self-pay

## 2024-10-07 ENCOUNTER — Ambulatory Visit (HOSPITAL_COMMUNITY)
Admission: RE | Admit: 2024-10-07 | Discharge: 2024-10-07 | Disposition: A | Source: Ambulatory Visit | Attending: Surgery | Admitting: Surgery

## 2024-10-07 ENCOUNTER — Encounter: Payer: Self-pay | Admitting: Physician Assistant

## 2024-10-07 VITALS — BP 162/91 | HR 86 | Temp 97.7°F | Wt 139.1 lb

## 2024-10-07 DIAGNOSIS — R911 Solitary pulmonary nodule: Secondary | ICD-10-CM | POA: Insufficient documentation

## 2024-10-07 DIAGNOSIS — I739 Peripheral vascular disease, unspecified: Secondary | ICD-10-CM

## 2024-10-07 DIAGNOSIS — I7025 Atherosclerosis of native arteries of other extremities with ulceration: Secondary | ICD-10-CM | POA: Insufficient documentation

## 2024-10-07 LAB — VAS US ABI WITH/WO TBI
Left ABI: 0.8
Right ABI: 0.66

## 2024-10-07 NOTE — Telephone Encounter (Signed)
 Pt has been scheduled and has been made aware. Sending to [employer] to [facility]

## 2024-10-07 NOTE — Telephone Encounter (Addendum)
 Called patient and updated her on PET results. PET is not highly concerning for extra cervical and extrathoracic mets. Enlarged mediastinal nodes SUV 2.8 (cervical LN SUV 6.4). pt has small right effusion likely from HFpEF. The small left upper lobe is still present.   Will plan for EBUS/TBNA and possible navigational biopsy of the left upper lobe nodule in endoscopy suite to r/o mets for appropriate staging and initiation of definitive treatment.  Pt agreeable. Risks and benefits discussed in clinic and again over the phone. I explained the bronchoscopic biopsy procedure at depth.  We discussed about other potential options including continued follow up, CT guided biopsy, surgical biopsy. Risks and benefits of bronchoscopy +/- biopsy discussed with the patient and his family in details and all the questions were answered. They understand the risk of bleeding, pneumothorax, injury to blood vessels and would like to proceed for the bronchoscopy with biopsy.   Pt will hold plavix  5 days prior to the procedure and eliquis  2 days prior the procedure.   Please schedule the following:  Provider performing procedure:Dr Byrum (or any other provider) Diagnosis: head and neck cancer Which side if for nodule / mass? left Procedure: .EBUS/TBNA and navigational bronchoscopy  Has patient been spoken to by Provider and given informed consent? yes Anesthesia: GA Do you need Fluro? yes Duration of procedure: 1.5 hrs Date: earliest available  Alternate Date: as above  Time: AM/ PM Location: [place] Does patient have OSA? no DM? no Or Latex allergy? no Medication Restriction/ Anticoagulate/Antiplatelet: plavix  and eliquis  as above Pre-op Labs Ordered:determined by Anesthesia Imaging request: super D ordered - to be scheduled. (If, SuperDimension CT Chest, please have STAT courier sent to ENDO)

## 2024-10-07 NOTE — Progress Notes (Signed)
 Office Note     CC:  follow up Requesting Provider:  Watt Mirza, MD  HPI: Taylor Bush is a 78 y.o. ([birth year]) female who presents for follow up of PAD. She recently underwent Aortogram, Aortobifemoral angiogram with Laser atherectomy of her left SFA and popliteal artery, shockwave lithotripsy of the left SFA/ popliteal artery and stenting by Dr. Serene on 07/09/24. This was performed secondary to non healing left foot wound.   She says overall her legs are feeling good. She did fall on Thanksgiving and hurt her hip and back so that has been making it challenging for her to ambulate but she otherwise denies any pain in her legs. She feels the foot wound is healing slowly. She is receiving wound care by Dr. Harden. She has been instructed to perform Vashe dressing changes daily and also elevate her legs to help with her edema. She is scheduled for follow up with Dr. Harden on Thursday 10/10/24.  Past Medical History:  Diagnosis Date   Allergic rhinitis    Allergy    Anemia in chronic kidney disease (CKD)    Arthritis    B12 deficiency 09/13/2021   Blood transfusion 2012   and 2013   Blood transfusion without reported diagnosis    Cataract    bilateral sx   Chronic low back pain    with radiculopathy   CKD (chronic kidney disease) stage 3, GFR 30-59 ml/min (HCC) 11/29/2013   HLD (hyperlipidemia)    HTN (hypertension)    Hx of total hip arthroplasty 12/15/2011   Open-angle glaucoma    PAD (peripheral artery disease)    with intermittent claudication, ABI 0.72 on Rt and 0.73 on L when assessed in 2010.     Past Surgical History:  Procedure Laterality Date   ABDOMINAL AORTOGRAM W/LOWER EXTREMITY N/A 07/09/2024   Procedure: ABDOMINAL AORTOGRAM W/LOWER EXTREMITY;  Surgeon: Serene Gaile ORN, MD;  [facility];  Service: Cardiovascular;  Laterality: N/A;   abnormal vascular studies  05/07/2009   ABI's 70, R SFA > 50%^ blockage    APPLICATION OF WOUND VAC Left 07/12/2024    Procedure: APPLICATION, WOUND VAC;  Surgeon: Harden Jerona GAILS, MD;  Location: [facility];  Service: Orthopedics;  Laterality: Left;   CATARACT EXTRACTION, BILATERAL  2011   EXCISION OF TONGUE LESION WITH LASER N/A 08/31/2015   Procedure: BIOPSY AND CO2 LASER OF TONGUE LESION ;  Surgeon: Ida Loader, MD;  Location: [facility];  Service: ENT;  Laterality: N/A;   EXCISION OF TONGUE LESION WITH LASER Left 06/29/2020   Procedure: EXCISION OF TONGUE LESION/CO2 laser w/biopsy;  Surgeon: Loader Ida, MD;  Location: [facility];  Service: ENT;  Laterality: Left;   EXCISION OF TONGUE LESION WITH LASER Bilateral 10/02/2023   Procedure: ORAL BIOPSY AND CO2 LASER ABLATATION OF TONGUE LESION;  Surgeon: Loader Ida, MD;  [facility];  Service: ENT;  Laterality: Bilateral;   EXCISION ORAL LESION WITH CO2 LASER N/A 12/08/2021   Procedure: EXCISION OF ORAL ORAL CAVITY LESION WITH CO2 LASER;  Surgeon: Loader Ida, MD;  Location: [facility];  Service: ENT;  Laterality: N/A;   EYE SURGERY     GLOSSECTOMY, PARTIAL Left 06/17/2024   Procedure: GLOSSECTOMY, PARTIAL;  Surgeon: Loader Ida, MD;  Location: [facility] OR;  Service: ENT;  Laterality: Left;   INCISION AND DRAINAGE OF WOUND Left 07/12/2024   Procedure: IRRIGATION AND DEBRIDEMENT WOUND;  Surgeon: Harden Jerona GAILS, MD;  Location: [facility]  OR;  Service: Orthopedics;  Laterality: Left;  LEFT FOOT DEBRIDEMENT   JOINT REPLACEMENT     LOWER EXTREMITY ANGIOGRAPHY  07/09/2024   Procedure: Lower Extremity Angiography;  Surgeon: Serene Gaile ORN, MD;  [facility];  Service: Cardiovascular;;   LOWER EXTREMITY INTERVENTION  07/09/2024   Procedure: LOWER EXTREMITY INTERVENTION;  Surgeon: Serene Gaile ORN, MD;  [facility];  Service: Cardiovascular;;   OTHER SURGICAL HISTORY     ganglion cyst removed left wrist    PERIPHERAL INTRAVASCULAR LITHOTRIPSY  07/09/2024   Procedure: PERIPHERAL INTRAVASCULAR LITHOTRIPSY;   Surgeon: Serene Gaile ORN, MD;  [facility];  Service: Cardiovascular;;   PERIPHERAL VASCULAR ATHERECTOMY  07/09/2024   Procedure: PERIPHERAL VASCULAR ATHERECTOMY;  Surgeon: Serene Gaile ORN, MD;  [facility];  Service: Cardiovascular;;   RADICAL NECK DISSECTION Left 08/21/2024   Procedure: DISSECTION, NECK, RADICAL;  Surgeon: Jesus Oliphant, MD;  Location: [facility] OR;  Service: ENT;  Laterality: Left;   TOTAL HIP ARTHROPLASTY  10/03/2011   Procedure: TOTAL HIP ARTHROPLASTY;  Surgeon: Dempsey LULLA Moan;  Location: [facility];  Service: Orthopedics;  Laterality: Right;   TOTAL HIP ARTHROPLASTY  12/21/2011   Procedure: TOTAL HIP ARTHROPLASTY;  Surgeon: Dempsey LULLA Moan, MD;  Location: [facility];  Service: Orthopedics;  Laterality: Left;   TUBAL LIGATION      Social History   Socioeconomic History   Marital status: Widowed    Spouse name: Not on file   Number of children: Not on file   Years of education: Not on file   Highest education level: Not on file  Occupational History   Not on file  Tobacco Use   Smoking status: Former    Current packs/day: 0.00    Types: Cigarettes    Quit date: 11/07/2008    Years since quitting: 15.9   Smokeless tobacco: Never  Vaping Use   Vaping status: Never Used  Substance and Sexual Activity   Alcohol  use: No   Drug use: No   Sexual activity: Not on file  Other Topics Concern   Not on file  Social History Narrative   Retired haematologist, 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    Social Drivers of Corporate Investment Banker Strain: Low Risk  (04/29/2024)   Overall Financial Resource Strain (CARDIA)    Difficulty of Paying Living Expenses: Not hard at all  Food Insecurity: No Food Insecurity (09/20/2024)   Hunger Vital Sign    Worried About Running Out of Food in the Last Year: Never true    Ran Out of Food in the Last Year: Never true  Transportation Needs: No Transportation Needs (09/20/2024)   PRAPARE - Scientist, Research (physical Sciences) (Medical): No    Lack of Transportation (Non-Medical): No  Physical Activity: Insufficiently Active (04/29/2024)   Exercise Vital Sign    Days of Exercise per Week: 7 days    Minutes of Exercise per Session: 20 min  Stress: No Stress Concern Present (04/29/2024)   [employer] of Occupational Health - Occupational Stress Questionnaire    Feeling of Stress: Not at all  Social Connections: Moderately Isolated (08/21/2024)   Social Connection and Isolation Panel    Frequency of Communication with Friends and Family: More than three times a week    Frequency of Social Gatherings with Friends and Family: More than three times a week    Attends Religious Services: More than 4 times per  year    Active Member of Clubs or Organizations: No    Attends Banker Meetings: Never    Marital Status: Widowed  Intimate Partner Violence: Not At Risk (09/20/2024)   Humiliation, Afraid, Rape, and Kick questionnaire    Fear of Current or Ex-Partner: No    Emotionally Abused: No    Physically Abused: No    Sexually Abused: No    Family History  Problem Relation Age of Onset   Colon polyps Mother 60   Heart attack Father        38s   Stroke Neg Hx        also no PAD or aneurysm   Colon cancer Neg Hx    Esophageal cancer Neg Hx    Stomach cancer Neg Hx    Rectal cancer Neg Hx     Current Outpatient Medications  Medication Sig Dispense Refill   amLODipine  (NORVASC ) 10 MG tablet TAKE 1 TABLET BY MOUTH DAILY 100 tablet 0   apixaban  (ELIQUIS ) 5 MG TABS tablet Take 1 tablet (5 mg total) by mouth 2 (two) times daily. 60 tablet 3   atorvastatin  (LIPITOR ) 80 MG tablet Take 1 tablet (80 mg total) by mouth daily. (Patient not taking: Reported on 09/26/2024) 30 tablet 3   clopidogrel  (PLAVIX ) 75 MG tablet Take 1 tablet (75 mg total) by mouth daily with breakfast. 30 tablet 3   Faricimab -svoa (VABYSMO  IZ) 1 Dose by Intravitreal route every 6 (six) weeks. (Patient not taking:  Reported on 09/26/2024)     HYDROcodone -acetaminophen  (NORCO/VICODIN) 5-325 MG tablet Take 1 tablet by mouth every 6 (six) hours as needed for moderate pain (pain score 4-6). 16 tablet 0   lisinopril  (ZESTRIL ) 20 MG tablet TAKE 1 TABLET BY MOUTH DAILY 100 tablet 0   metoprolol  (TOPROL -XL) 200 MG 24 hr tablet TAKE 1 TABLET BY MOUTH DAILY 100 tablet 0   mupirocin  ointment (BACTROBAN ) 2 % Place 1 Application into the nose 2 (two) times daily. (Patient not taking: Reported on 09/26/2024) 22 g 0   nitroGLYCERIN  (NITRODUR - DOSED IN MG/24 HR) 0.2 mg/hr patch Place 1 patch (0.2 mg total) onto the skin daily. (Patient not taking: Reported on 09/26/2024) 30 patch 12   ondansetron  (ZOFRAN -ODT) 4 MG disintegrating tablet Take 1 tablet (4 mg total) by mouth every 8 (eight) hours as needed for nausea or vomiting. 20 tablet 0   ondansetron  (ZOFRAN -ODT) 4 MG disintegrating tablet Take 1 tablet (4 mg total) by mouth every 8 (eight) hours as needed for nausea or vomiting. (Patient not taking: Reported on 09/26/2024) 20 tablet 0   senna-docusate (SENOKOT-S) 8.6-50 MG tablet Take 1 tablet by mouth at bedtime as needed for mild constipation. (Patient not taking: Reported on 09/26/2024)     traMADol  (ULTRAM ) 50 MG tablet Take 1 tablet (50 mg total) by mouth every 8 (eight) hours as needed. 40 tablet 2   No current facility-administered medications for this visit.    Allergies  Allergen Reactions   Prednisone Other (See Comments)    Joint pain,joint stiffness   Statins Rash    hands broke out     REVIEW OF SYSTEMS:  Negative unless noted in HPI [X]  denotes positive finding, [ ]  denotes negative finding Cardiac  Comments:  Chest pain or chest pressure:    Shortness of breath upon exertion:    Short of breath when lying flat:    Irregular heart rhythm:        Vascular    Pain  in calf, thigh, or hip brought on by ambulation:    Pain in feet at night that wakes you up from your sleep:     Blood clot in  your veins:    Leg swelling:         Pulmonary    Oxygen at home:    Productive cough:     Wheezing:         Neurologic    Sudden weakness in arms or legs:     Sudden numbness in arms or legs:     Sudden onset of difficulty speaking or slurred speech:    Temporary loss of vision in one eye:     Problems with dizziness:         Gastrointestinal    Blood in stool:     Vomited blood:         Genitourinary    Burning when urinating:     Blood in urine:        Psychiatric    Major depression:         Hematologic    Bleeding problems:    Problems with blood clotting too easily:        Skin    Rashes or ulcers:        Constitutional    Fever or chills:      PHYSICAL EXAMINATION:  Vitals:   10/07/24 1430  BP: (!) 162/91  Pulse: 86  Temp: 97.7 F (36.5 C)  TempSrc: Temporal  Weight: 139 lb 1.6 oz (63.1 kg)    General:  WDWN in NAD; vital signs documented above Gait: Not observed HENT: WNL, normocephalic Pulmonary: normal non-labored breathing Cardiac: regular HR Abdomen: soft Vascular Exam/Pulses: 2+ femoral pulses bilaterally, no palpable distal pulses. Feet warm and well perfused Extremities: without ischemic changes, without Gangrene , without cellulitis; with open wound on dorsum of left foot as shown below. Tissue appears healthy in wound bed  Musculoskeletal: no muscle wasting or atrophy  Neurologic: A&O X 3 Psychiatric:  The pt has Normal affect.   Non-Invasive Vascular Imaging:   +-------+-----------+-----------+------------+------------+  ABI/TBIToday's ABIToday's TBIPrevious ABIPrevious TBI  +-------+-----------+-----------+------------+------------+  Right 0.66       0.31       0.7         0.23          +-------+-----------+-----------+------------+------------+  Left  0.8        0.68       0.53        absent        +-------+-----------+-----------+------------+------------+    Left ABIs and TBIs appear increased compared to  prior study on 07/07/24.   VAS US  Lower Extremity Arterial Duplex: Summary:  Right: 50-74% stenosis noted in the superficial femoral artery. 30-49% stenosis noted in the popliteal artery. Patent stent with no evidence of stenosis in the superficial femoral artery and popliteal artery artery.    ASSESSMENT/PLAN:: 78 y.o. female here for follow up of PAD. She recently underwent Aortogram, Aortobifemoral angiogram with Laser atherectomy of her left SFA and popliteal artery, shockwave lithotripsy of the left SFA/ popliteal artery and stenting by Dr. Serene on 07/09/24. This was performed secondary to non healing left foot wound. Left foot wound is slowly improving. She is doing daily Vashe cleanses. She is without any rest pain, claudication or new wounds.  - ABI increased on LLE compared to before intervention. Right Stable - Duplex shows some elevated velocities within the SFA/popliteal artery, stent is patent, there is  biphasic flow throughout - continue Eliquis , Plavix , Statin - Will have her follow up again in 6-8 weeks with repeat Duplex    Teretha Damme, PA-C [facility] [phone]  Clinic MD:   Serene

## 2024-10-10 ENCOUNTER — Encounter: Payer: Self-pay | Admitting: Orthopedic Surgery

## 2024-10-10 ENCOUNTER — Ambulatory Visit: Admitting: Orthopedic Surgery

## 2024-10-10 ENCOUNTER — Encounter (HOSPITAL_COMMUNITY): Payer: Self-pay | Admitting: Emergency Medicine

## 2024-10-10 ENCOUNTER — Other Ambulatory Visit

## 2024-10-10 ENCOUNTER — Other Ambulatory Visit: Payer: Self-pay | Admitting: *Deleted

## 2024-10-10 ENCOUNTER — Other Ambulatory Visit: Payer: Self-pay

## 2024-10-10 DIAGNOSIS — I739 Peripheral vascular disease, unspecified: Secondary | ICD-10-CM

## 2024-10-10 DIAGNOSIS — I7025 Atherosclerosis of native arteries of other extremities with ulceration: Secondary | ICD-10-CM

## 2024-10-10 DIAGNOSIS — S91352A Open bite, left foot, initial encounter: Secondary | ICD-10-CM

## 2024-10-10 DIAGNOSIS — I96 Gangrene, not elsewhere classified: Secondary | ICD-10-CM

## 2024-10-10 DIAGNOSIS — W540XXA Bitten by dog, initial encounter: Secondary | ICD-10-CM

## 2024-10-10 NOTE — Progress Notes (Signed)
 Office Visit Note   Patient: Taylor Bush           Date of Birth: [birth year]           MRN: [id number] Visit Date: 10/10/2024              Requested by: Watt Mirza, MD [address] [place],  [place] [address] PCP: Watt Mirza, MD  Chief Complaint  Patient presents with   Left Foot - Routine Post Op    07/12/24 I&D left foot dog bite      HPI: Discussed the use of AI scribe software for clinical note transcription with the patient, who gave verbal consent to proceed.  History of Present Illness Taylor Bush is a 78 year old female who presents with a postoperative wound on her foot.  She has been managing the wound with Betadine , gauze, and a strap. She reports that the wound has been slow to heal.  She has been more active on her foot recently, leading to increased swelling in the foot and ankle. She is currently using a postoperative shoe.  A Vashe dressing is being used as part of her wound care regimen.  Patient is 3 months status post gangrenous ulceration dorsum of the left foot secondary to a dog bite.  Assessment & Plan: Visit Diagnoses:  1. Dog bite of left foot, initial encounter   2. Gangrene of left foot (HCC)     Plan: Assessment and Plan Assessment & Plan Chronic non-healing foot wound Wound measures 2x3 cm with granulation tissue and fibrinous exudate. Healing is slow. - Continue Betadine  and gauze dressing. - Apply Vashe dressing. - Scrub wound with soap and water. - Maintain foot elevation.  Foot and ankle edema Increased swelling likely due to recent activity. - Maintain foot elevation. - Use ACE wrap compression.      Follow-Up Instructions: No follow-ups on file.   Ortho Exam  Patient is alert, oriented, no adenopathy, well-dressed, normal affect, normal respiratory effort. Physical Exam EXTREMITIES: Wound 2x3 cm with flat granulation tissue. Small thin layer of fibrinous exudative tissue. Increased swelling in foot  and ankle.      Imaging: No results found. No images are attached to the encounter.  Labs: Lab Results  Component Value Date   HGBA1C 5.5 07/07/2024   HGBA1C 5.7 09/14/2023   HGBA1C 5.9 09/07/2022   CRP 0.8 07/10/2024   CRP <0.5 07/09/2024   CRP 0.5 07/08/2024   REPTSTATUS 07/11/2024 FINAL 07/06/2024   CULT  07/06/2024    NO GROWTH 5 DAYS Performed at [facility], [address], [place], [place] [address]      Lab Results  Component Value Date   ALBUMIN 3.1 (L) 07/06/2024   ALBUMIN 3.9 09/14/2023   ALBUMIN 4.1 09/07/2022    Lab Results  Component Value Date   MG 1.2 (L) 09/08/2024   MG 1.1 (L) 09/05/2024   MG 1.3 (L) 08/21/2024   Lab Results  Component Value Date   VD25OH 27.56 (L) 09/14/2023   VD25OH 37.20 09/07/2022   VD25OH 26.75 (L) 09/13/2021    No results found for: PREALBUMIN    Latest Ref Rng & Units 09/08/2024    8:50 AM 09/05/2024    2:21 PM 08/21/2024    9:49 AM  CBC EXTENDED  WBC 4.0 - 10.5 K/uL 8.5  11.1  9.6   RBC 3.87 - 5.11 MIL/uL 3.10  3.02  3.34   Hemoglobin 12.0 - 15.0  g/dL 9.3  9.2  89.7   HCT 63.9 - 46.0 % 29.2  28.6  32.2   Platelets 150 - 400 K/uL 297  302  229   NEUT# 1.7 - 7.7 K/uL 6.1     Lymph# 0.7 - 4.0 K/uL 1.3        There is no height or weight on file to calculate BMI.  Orders:  No orders of the defined types were placed in this encounter.  No orders of the defined types were placed in this encounter.    Procedures: No procedures performed  Clinical Data: No additional findings.  ROS:  All other systems negative, except as noted in the HPI. Review of Systems  Objective: Vital Signs: There were no vitals taken for this visit.  Specialty Comments:  No specialty comments available.  PMFS History: Patient Active Problem List   Diagnosis Date Noted   Lung nodule 10/07/2024   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) 09/20/2024   Paroxysmal atrial fibrillation (HCC)  08/21/2024   Peripheral arterial disease 07/12/2024   Dog bite of ankle, sequela 07/06/2024   Gangrene of left foot (HCC) 07/06/2024   B12 deficiency 09/13/2021   Allergic rhinitis    CKD (chronic kidney disease) stage 3, GFR 30-59 ml/min (HCC) 11/29/2013   Anemia of chronic disease 12/16/2011   Hx of total hip arthroplasty 12/15/2011   Atherosclerosis of native artery of extremity with intermittent claudication 06/17/2009   Lumbar back pain with radiculopathy affecting left lower extremity 06/03/2009   HYPERLIPIDEMIA 12/15/2008   Essential hypertension 12/15/2008   Open-angle glaucoma 10/27/2008   Past Medical History:  Diagnosis Date   Allergic rhinitis    Allergy    Anemia in chronic kidney disease (CKD)    Arthritis    B12 deficiency 09/13/2021   Blood transfusion 2012   and 2013   Blood transfusion without reported diagnosis    Cancer (HCC)    tongue   Cataract    bilateral sx   Chronic low back pain    with radiculopathy   CKD (chronic kidney disease) stage 3, GFR 30-59 ml/min (HCC) 11/29/2013   Dysrhythmia    A. Fib   HLD (hyperlipidemia)    HTN (hypertension)    Hx of total hip arthroplasty 12/15/2011   Open-angle glaucoma    PAD (peripheral artery disease)    with intermittent claudication, ABI 0.72 on Rt and 0.73 on L when assessed in 2010.     Family History  Problem Relation Age of Onset   Colon polyps Mother 33   Heart attack Father        71s   Stroke Neg Hx        also no PAD or aneurysm   Colon cancer Neg Hx    Esophageal cancer Neg Hx    Stomach cancer Neg Hx    Rectal cancer Neg Hx     Past Surgical History:  Procedure Laterality Date   ABDOMINAL AORTOGRAM W/LOWER EXTREMITY N/A 07/09/2024   Procedure: ABDOMINAL AORTOGRAM W/LOWER EXTREMITY;  Surgeon: Serene Gaile ORN, MD;  [facility];  Service: Cardiovascular;  Laterality: N/A;   abnormal vascular studies  05/07/2009   ABI's 70, R SFA > 50%^ blockage    APPLICATION OF WOUND  VAC Left 07/12/2024   Procedure: APPLICATION, WOUND VAC;  Surgeon: Harden Jerona GAILS, MD;  [facility];  Service: Orthopedics;  Laterality: Left;   CATARACT EXTRACTION, BILATERAL  2011   EXCISION OF  TONGUE LESION WITH LASER N/A 08/31/2015   Procedure: BIOPSY AND CO2 LASER OF TONGUE LESION ;  Surgeon: Ida Loader, MD;  Location: [facility];  Service: ENT;  Laterality: N/A;   EXCISION OF TONGUE LESION WITH LASER Left 06/29/2020   Procedure: EXCISION OF TONGUE LESION/CO2 laser w/biopsy;  Surgeon: Loader Ida, MD;  Location: [facility];  Service: ENT;  Laterality: Left;   EXCISION OF TONGUE LESION WITH LASER Bilateral 10/02/2023   Procedure: ORAL BIOPSY AND CO2 LASER ABLATATION OF TONGUE LESION;  Surgeon: Loader Ida, MD;  [facility];  Service: ENT;  Laterality: Bilateral;   EXCISION ORAL LESION WITH CO2 LASER N/A 12/08/2021   Procedure: EXCISION OF ORAL ORAL CAVITY LESION WITH CO2 LASER;  Surgeon: Loader Ida, MD;  Location: [facility];  Service: ENT;  Laterality: N/A;   EYE SURGERY     GLOSSECTOMY, PARTIAL Left 06/17/2024   Procedure: GLOSSECTOMY, PARTIAL;  Surgeon: Loader Ida, MD;  Location[facility];  Service: ENT;  Laterality: Left;   INCISION AND DRAINAGE OF WOUND Left 07/12/2024   Procedure: IRRIGATION AND DEBRIDEMENT WOUND;  Surgeon: Harden Jerona GAILS, MD;  Location: [facility];  Service: Orthopedics;  Laterality: Left;  LEFT FOOT DEBRIDEMENT   JOINT REPLACEMENT     LOWER EXTREMITY ANGIOGRAPHY  07/09/2024   Procedure: Lower Extremity Angiography;  Surgeon: Serene Gaile ORN, MD;  [facility];  Service: Cardiovascular;;   LOWER EXTREMITY INTERVENTION  07/09/2024   Procedure: LOWER EXTREMITY INTERVENTION;  Surgeon: Serene Gaile ORN, MD;  [facility];  Service: Cardiovascular;;   OTHER SURGICAL HISTORY     ganglion cyst removed left wrist    PERIPHERAL INTRAVASCULAR LITHOTRIPSY  07/09/2024   Procedure: PERIPHERAL  INTRAVASCULAR LITHOTRIPSY;  Surgeon: Serene Gaile ORN, MD;  [facility];  Service: Cardiovascular;;   PERIPHERAL VASCULAR ATHERECTOMY  07/09/2024   Procedure: PERIPHERAL VASCULAR ATHERECTOMY;  Surgeon: Serene Gaile ORN, MD;  [facility];  Service: Cardiovascular;;   RADICAL NECK DISSECTION Left 08/21/2024   Procedure: DISSECTION, NECK, RADICAL;  Surgeon: Loader Ida, MD;  Location: [facility] OR;  Service: ENT;  Laterality: Left;   TOTAL HIP ARTHROPLASTY  10/03/2011   Procedure: TOTAL HIP ARTHROPLASTY;  Surgeon: Dempsey GAILS Moan;  Location: [facility];  Service: Orthopedics;  Laterality: Right;   TOTAL HIP ARTHROPLASTY  12/21/2011   Procedure: TOTAL HIP ARTHROPLASTY;  Surgeon: Dempsey GAILS Moan, MD;  Location: [facility];  Service: Orthopedics;  Laterality: Left;   TUBAL LIGATION     Social History   Occupational History   Not on file  Tobacco Use   Smoking status: Former    Current packs/day: 0.00    Types: Cigarettes    Quit date: 11/07/2008    Years since quitting: 15.9   Smokeless tobacco: Never  Vaping Use   Vaping status: Never Used  Substance and Sexual Activity   Alcohol  use: No   Drug use: No   Sexual activity: Not on file

## 2024-10-10 NOTE — Progress Notes (Signed)
 SDW CALL  Patient was given pre-op instructions over the phone. The opportunity was given for the patient to ask questions. No further questions asked. Patient verbalized understanding of instructions given.   PCP - Spence Copland Cardiologist - Dr. Peter Jordan - LOV 09/05/24  PPM/ICD - denies Device Orders - n/a Rep Notified - n/a  Chest CT - 09/18/24 EKG - 09/08/24 Stress Test -  ECHO - 08/22/24 Cardiac Cath -   Sleep Study - denies CPAP - n/a  No DM  Last dose of GLP1 agonist-  n/a GLP1 instructions:  n/a  Blood Thinner Instructions:  last dose of Plavix  was 12/2 per instructions;  last dose of Eliquis  will be 12/5 per instructions - patient is aware Aspirin  Instructions:n/a  ERAS Protcol - NPO PRE-SURGERY Ensure or G2- n/a  COVID TEST- n/a   Anesthesia review: yes - cardiac history - recently diagnosed with a fib in October 2025;   Patient denies shortness of breath, fever, cough and chest pain over the phone call   All instructions explained to the patient, with a verbal understanding of the material. Patient agrees to go over the instructions while at home for a better understanding.

## 2024-10-11 ENCOUNTER — Ambulatory Visit
Admission: RE | Admit: 2024-10-11 | Discharge: 2024-10-11 | Disposition: A | Discharge status: Home / Self Care | Source: Ambulatory Visit

## 2024-10-11 DIAGNOSIS — R911 Solitary pulmonary nodule: Secondary | ICD-10-CM

## 2024-10-11 NOTE — Progress Notes (Signed)
 Anesthesia Chart Review: SAME DAY WORK-UP  Case: [id number] Date/Time: 10/14/24 1300   Procedures:      VIDEO BRONCHOSCOPY WITH ENDOBRONCHIAL NAVIGATION (Left)     BRONCHOSCOPY, WITH EBUS (Bilateral)   Anesthesia type: General   Diagnosis: Lung nodule [R91.1]   Pre-op diagnosis: head and neck cancer   [facility]   Surgeons: Shelah Lamar RAMAN, MD       DISCUSSION: Patient is a 78 year old female scheduled for the above procedure. S/p tongue biopsy 10/02/2023 showed moderate squamous dysplasia. Evaluation by Dr. Jesus on 06/13/2024 showed a new tongue mass, s/p left partial glossectomy on 06/17/2024 with pathology/staging showing moderately differentiated squamous cell carcinoma with T2 oral tongue cancer depth of invasion 9 mm. S/p modified radical left neck dissection on 08/21/2024, 3/33 LN+.  She developed post-operative afib. 09/18/2024 CT chest showed an 8 mm LUL lung nodule.    History includes former smoker (quit 11/07/08), HTN, HLD, PAD (non-healing left foot dog bite 06/25/2024, s/p left SFA and popliteal artery laser atherectomy, shockwave lithotripsy, and overlapping Eluvia stents 07/09/2024, s/p I&D left foot wound, wound VAC 07/12/2024), afib (08/2024), CKD (stage 3), anemia, open angle glaucoma, allergic rhinitis, osteoarthritis (right THA 10/03/11, left THA 12/21/11), tongue cancer (s/p left partial glossectomy 06/17/2024, s/p left modified radical neck dissection 08/21/2024).   Chemotherapy is on hold until her left foot wound is healed. Per 09/20/2024 RAD-ONC follow-up with Dr. Izell, Radiation therapy tentatively planned for left tongue and bilateral cervical nodes. Risks include taste changes, thick saliva, skin irritation, potential feeding tube. Benefits include potential cure if localized. Emphasized confirming cancer stage before radiation. She was referred to pulmonology for evaluation of LUL lung nodule. PET Scan and Super D Chest CT done. Per Dr.  Pleas, Will plan for EBUS/TBNA and possible navigational biopsy of the left upper lobe nodule in endoscopy suite to r/o mets for appropriate staging and initiation of definitive treatment.   Established with cardiology on 08/21/2024 after developing rate controlled post-operative afib. Echo was completed that showed: LVEF 55-60%, moderately reduced RV systolic function, severe LAE, mild MR, mild to moderate TR, normal IVC. She remained in A-Fib while in the hospital. She was continued on her home Toprol  200 mg daily and recommended to start Eliquis  when appropriate per ENT. As if her 09/05/2024 cardiology follow-up with Natalia Birmingham, PA-C, she remained fairly asymptomatic of afib. DCCV was discussed, but she wanted to defer until she had completed radiation therapy and would consider her options at her 2 months follow-up (scheduled for 11/05/2024).   She is on Eliquis  for afib and on Plavix  for PAD stents. Dr. Serene gave permission to temporarily hold Plavix  prior to her neck dissection. She reported last Plavix  10/08/2024 and last Eliquis  planned for 10/11/2024.   Anesthesia team to evaluate on the day of surgery.  Last labs noted are from 09/08/2024 with H/H stable at 9.3/29.2, and Creatinine stable at 1.52 (range 1.25-1.90 since 07/2024 in [facility]).    VS:  Wt Readings from Last 3 Encounters:  10/07/24 63.1 kg  09/26/24 64.4 kg  09/20/24 65.2 kg   BP Readings from Last 3 Encounters:  10/07/24 (!) 162/91  09/26/24 122/81  09/20/24 127/60   Pulse Readings from Last 3 Encounters:  10/07/24 86  09/26/24 69  09/20/24 77     PROVIDERS: Watt Mirza, MD is PCP  Jordan, Peter, MD is cardiologist  Daral, Dipti, MD is pulmonologist Dennise Capri, MD is nephrologist  Jesus Oliphant, MD  is ENT Harden Lame, MD is orthopedic surgeon Serene, V. Malvina, MD is vascular surgeon (placed LLE stent 07/09/2024, although initial consultation by Magda Ned, MD) Izell Domino, MD is  RAD-ONC Autumn Millman, MD is HEM-ONC   LABS: Last results in [facility] include: Lab Results  Component Value Date   WBC 8.5 09/08/2024   HGB 9.3 (L) 09/08/2024   HCT 29.2 (L) 09/08/2024   PLT 297 09/08/2024   GLUCOSE 100 (H) 09/08/2024   CHOL 129 07/10/2024   TRIG 123 07/10/2024   HDL 41 07/10/2024   LDLCALC 63 07/10/2024   ALT 13 07/06/2024   AST 17 07/06/2024   NA 139 09/08/2024   K 3.8 09/08/2024   CL 103 09/08/2024   CREATININE 1.52 (H) 09/08/2024   BUN 14 09/08/2024   CO2 25 09/08/2024   TSH 1.187 08/21/2024   INR 0.9 07/07/2024   HGBA1C 5.5 07/07/2024     IMAGES: CT Super D Chest 10/11/2024: IMPRESSION: 1. 7 mm anterior segment left upper lobe nodule, unchanged from 09/18/2024 and too small for PET resolution on 10/01/2024. This could be followed with CT chest without contrast in 3 months, as clinically indicated, in this patient with a history of tongue cancer. 2. Small bilateral pleural effusions. 3. New L1 superior endplate compression fracture. 4. Aortic atherosclerosis (ICD10-I70.0). Coronary artery calcification. 5. Enlarged pulmonic trunk, indicative of pulmonary arterial hypertension. 6. Emphysema (ICD10-J43.9). Low-dose CT lung cancer screening is recommended for patients who are 6-75 years of age with a 20+ pack-year history of smoking and who are currently smoking or quit <=15 years ago.    PET Scan 10/01/2024: IMPRESSION: 1. Hypermetabolic left cervical lymph node, indicative of residual/recurrent disease. 2. Hypermetabolic 4 mm parotid nodule, worrisome for malignancy. 3. No abnormal hypermetabolism associated with the left posterior tongue, as questioned on CT neck 09/18/2024. 4. Small right pleural effusion. 5. Aortic atherosclerosis (ICD10-I70.0). Coronary artery calcification. 6. Enlarged pulmonic trunk, indicative of pulmonary arterial hypertension.    CT Soft tissue neck 09/18/2024: IMPRESSION: 1. Prominent heterogeneous left level  3/4 cervical lymph node measuring 1.0 x 1.2 cm with central hypoattenuation concerning for possible central necrosis. Recommend correlation with PET/CT. 2. Subtle asymmetric soft tissue at the left posterior tongue, possibly postsurgical. Recommend attention on PET/CT. 3. Postsurgical changes of the left neck with left submandibular gland likely surgically absent. 4. Bulky calcified atherosclerosis at the bilateral carotid bifurcations resulting in at least moderate stenosis. 5. Aortic arch atherosclerosis. 6. Carotid siphon atherosclerosis. 7. Cervical spondylosis with moderate to severe disc space narrowing at C6-C7.  CT Abd 09/18/2024: IMPRESSION: 1. No acute intra-abdominal pathology. No evidence of metastatic disease in the abdomen. 2. Colonic diverticulosis. 3.  Aortic Atherosclerosis (ICD10-I70.0).    EKG: 09/08/2024:  Atrial fibrillation at 69 bpm Borderline repolarization abnormality  No significant change since last tracing  Confirmed by Dasie Faden ([id number]) on 09/08/2024 8:58:31 AM     CV: Echo 08/22/2024: IMPRESSIONS   1. Left ventricular ejection fraction, by estimation, is 55 to 60%. The  left ventricle has normal function. The left ventricle has no regional  wall motion abnormalities. There is mild concentric left ventricular  hypertrophy. Left ventricular diastolic  parameters are indeterminate.   2. Right ventricular systolic function is moderately reduced. The right  ventricular size is normal.   3. Left atrial size was severely dilated.   4. The mitral valve is degenerative. Mild mitral valve regurgitation. No  evidence of mitral stenosis.   5. Tricuspid valve regurgitation is mild to  moderate.   6. The aortic valve is normal in structure. Aortic valve regurgitation is  not visualized. Aortic valve sclerosis is present, with no evidence of  aortic valve stenosis.   7. The inferior vena cava is normal in size with greater than 50%  respiratory  variability, suggesting right atrial pressure of 3 mmHg.    Aortobifemoral Angiogram with LLE angiogram 07/09/2024: Impression:             #1  Occluded and heavily circumferentially calcified left superficial femoral and popliteal artery.  This was successfully recanalized.  The artery was treated with laser atherectomy and intra-arterial balloon angioplasty followed by stenting using 6 mm overlapping Eluvia stents             #2  Single-vessel runoff via the peroneal artery             #3  Patient has been optimally revascularized however because of the heavily calcified lesions, I was unable to get full expansion of her stents.  She will need to be maintained on aspirin  and Plavix  in addition to a statin     Nuclear stress test 09/13/2011: Impression: Normal stress nuclear study, EF 78%.    CV:  Past Medical History:  Diagnosis Date   Allergic rhinitis    Allergy    Anemia in chronic kidney disease (CKD)    Arthritis    B12 deficiency 09/13/2021   Blood transfusion 2012   and 2013   Blood transfusion without reported diagnosis    Cancer (HCC)    tongue   Cataract    bilateral sx   Chronic low back pain    with radiculopathy   CKD (chronic kidney disease) stage 3, GFR 30-59 ml/min (HCC) 11/29/2013   Dysrhythmia    A. Fib   HLD (hyperlipidemia)    HTN (hypertension)    Hx of total hip arthroplasty 12/15/2011   Open-angle glaucoma    PAD (peripheral artery disease)    with intermittent claudication, ABI 0.72 on Rt and 0.73 on L when assessed in 2010.     Past Surgical History:  Procedure Laterality Date   ABDOMINAL AORTOGRAM W/LOWER EXTREMITY N/A 07/09/2024   Procedure: ABDOMINAL AORTOGRAM W/LOWER EXTREMITY;  Surgeon: Serene Gaile ORN, MD;  [facility];  Service: Cardiovascular;  Laterality: N/A;   abnormal vascular studies  05/07/2009   ABI's 70, R SFA > 50%^ blockage    APPLICATION OF WOUND VAC Left 07/12/2024   Procedure: APPLICATION, WOUND VAC;   Surgeon: Harden Jerona GAILS, MD;  [facility];  Service: Orthopedics;  Laterality: Left;   CATARACT EXTRACTION, BILATERAL  2011   EXCISION OF TONGUE LESION WITH LASER N/A 08/31/2015   Procedure: BIOPSY AND CO2 LASER OF TONGUE LESION ;  Surgeon: Ida Loader, MD;  Location: [facility];  Service: ENT;  Laterality: N/A;   EXCISION OF TONGUE LESION WITH LASER Left 06/29/2020   Procedure: EXCISION OF TONGUE LESION/CO2 laser w/biopsy;  Surgeon: Loader Ida, MD;  Location: [facility];  Service: ENT;  Laterality: Left;   EXCISION OF TONGUE LESION WITH LASER Bilateral 10/02/2023   Procedure: ORAL BIOPSY AND CO2 LASER ABLATATION OF TONGUE LESION;  Surgeon: Loader Ida, MD;  [facility];  Service: ENT;  Laterality: Bilateral;   EXCISION ORAL LESION WITH CO2 LASER N/A 12/08/2021   Procedure: EXCISION OF ORAL ORAL CAVITY LESION WITH CO2 LASER;  Surgeon: Loader Ida, MD;  Location: [facility];  Service:  ENT;  Laterality: N/A;   EYE SURGERY     GLOSSECTOMY, PARTIAL Left 06/17/2024   Procedure: GLOSSECTOMY, PARTIAL;  Surgeon: Jesus Oliphant, MD;  Location: [facility] OR;  Service: ENT;  Laterality: Left;   INCISION AND DRAINAGE OF WOUND Left 07/12/2024   Procedure: IRRIGATION AND DEBRIDEMENT WOUND;  Surgeon: Harden Jerona GAILS, MD;  Location: [facility] OR;  Service: Orthopedics;  Laterality: Left;  LEFT FOOT DEBRIDEMENT   JOINT REPLACEMENT     LOWER EXTREMITY ANGIOGRAPHY  07/09/2024   Procedure: Lower Extremity Angiography;  Surgeon: Serene Gaile ORN, MD;  [facility];  Service: Cardiovascular;;   LOWER EXTREMITY INTERVENTION  07/09/2024   Procedure: LOWER EXTREMITY INTERVENTION;  Surgeon: Serene Gaile ORN, MD;  [facility];  Service: Cardiovascular;;   OTHER SURGICAL HISTORY     ganglion cyst removed left wrist    PERIPHERAL INTRAVASCULAR LITHOTRIPSY  07/09/2024   Procedure: PERIPHERAL INTRAVASCULAR LITHOTRIPSY;  Surgeon: Serene Gaile ORN, MD;  [facility];  Service: Cardiovascular;;   PERIPHERAL VASCULAR ATHERECTOMY  07/09/2024   Procedure: PERIPHERAL VASCULAR ATHERECTOMY;  Surgeon: Serene Gaile ORN, MD;  [facility];  Service: Cardiovascular;;   RADICAL NECK DISSECTION Left 08/21/2024   Procedure: DISSECTION, NECK, RADICAL;  Surgeon: Jesus Oliphant, MD;  Location: [facility] OR;  Service: ENT;  Laterality: Left;   TOTAL HIP ARTHROPLASTY  10/03/2011   Procedure: TOTAL HIP ARTHROPLASTY;  Surgeon: Dempsey GAILS Moan;  Location: [facility];  Service: Orthopedics;  Laterality: Right;   TOTAL HIP ARTHROPLASTY  12/21/2011   Procedure: TOTAL HIP ARTHROPLASTY;  Surgeon: Dempsey GAILS Moan, MD;  Location: [facility];  Service: Orthopedics;  Laterality: Left;   TUBAL LIGATION      MEDICATIONS: No current facility-administered medications for this encounter.    amLODipine  (NORVASC ) 10 MG tablet   apixaban  (ELIQUIS ) 5 MG TABS tablet   atorvastatin  (LIPITOR ) 80 MG tablet   clopidogrel  (PLAVIX ) 75 MG tablet   Faricimab -svoa (VABYSMO  IZ)   HYDROcodone -acetaminophen  (NORCO/VICODIN) 5-325 MG tablet   lisinopril  (ZESTRIL ) 20 MG tablet   metoprolol  (TOPROL -XL) 200 MG 24 hr tablet   mupirocin  ointment (BACTROBAN ) 2 %   nitroGLYCERIN  (NITRODUR - DOSED IN MG/24 HR) 0.2 mg/hr patch   ondansetron  (ZOFRAN -ODT) 4 MG disintegrating tablet   ondansetron  (ZOFRAN -ODT) 4 MG disintegrating tablet   senna-docusate (SENOKOT-S) 8.6-50 MG tablet   traMADol  (ULTRAM ) 50 MG tablet    Isaiah Ruder, PA-C Surgical Short Stay/Anesthesiology [facility] Phone [phone] [facility] Phone [phone] 10/11/2024 2:08 PM

## 2024-10-11 NOTE — Anesthesia Preprocedure Evaluation (Addendum)
 Anesthesia Evaluation  Patient identified by MRN, date of birth, ID band Patient awake    Reviewed: Allergy & Precautions, NPO status , Patient's Chart, lab work & pertinent test results, reviewed documented beta blocker date and time   History of Anesthesia Complications Negative for: history of anesthetic complications  Airway Mallampati: II  TM Distance: >3 FB Neck ROM: Full   Comment: L facial nerve palsy Dental  (+) Dental Advisory Given   Pulmonary former smoker   breath sounds clear to auscultation       Cardiovascular hypertension, Pt. on medications and Pt. on home beta blockers (-) angina + Peripheral Vascular Disease  + dysrhythmias Atrial Fibrillation  Rhythm:Regular Rate:Normal  Echo 08/22/2024: IMPRESSIONS   1. Left ventricular EF 55 to 60%. The LV has normal function, no regional  wall motion abnormalities. There is mild concentric LVH  2. RVF is moderately reduced. The right ventricular size is normal.   3. Left atrial size was severely dilated.   4. The mitral valve is degenerative. Mild mitral valve regurgitation. No evidence of mitral stenosis.   5. Tricuspid valve regurgitation is mild to moderate.   6. The aortic valve is normal in structure. Aortic valve regurgitation is not visualized. Aortic valve sclerosis is present, with no evidence of aortic valve stenosis.     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 3 weeks ago    GI/Hepatic negative GI ROS, Neg liver ROS,,,  Endo/Other  negative endocrine ROS    Renal/GU Renal InsufficiencyRenal disease     Musculoskeletal  (+) Arthritis ,    Abdominal   Peds  Hematology Eliquis : las dose 10/11/2024  Hb 9.7, plt 270k   Anesthesia Other Findings H/o tongue cancer:  s/p left partial glossectomy 06/17/2024, with left modified radical neck dissection   Reproductive/Obstetrics                               Anesthesia Physical Anesthesia Plan  ASA: 3  Anesthesia Plan: General   Post-op Pain Management: Tylenol  PO (pre-op)*   Induction: Intravenous  PONV Risk Score and Plan: 3 and Ondansetron , Dexamethasone  and Treatment may vary due to age or medical condition  Airway Management Planned: Oral ETT  Additional Equipment: None  Intra-op Plan:   Post-operative Plan: Extubation in OR  Informed Consent: I have reviewed the patients History and Physical, chart, labs and discussed the procedure including the risks, benefits and alternatives for the proposed anesthesia with the patient or authorized representative who has indicated his/her understanding and acceptance.     Dental advisory given  Plan Discussed with: CRNA and Surgeon  Anesthesia Plan Comments: (PAT note written 10/11/2024 by Allison Zelenak, PA-C.  )         Anesthesia Quick Evaluation

## 2024-10-14 ENCOUNTER — Ambulatory Visit (HOSPITAL_COMMUNITY): Payer: Self-pay

## 2024-10-14 ENCOUNTER — Ambulatory Visit (HOSPITAL_COMMUNITY)

## 2024-10-14 ENCOUNTER — Encounter (HOSPITAL_COMMUNITY): Payer: Self-pay | Admitting: Emergency Medicine

## 2024-10-14 ENCOUNTER — Encounter (HOSPITAL_COMMUNITY): Admission: RE | Disposition: A | Payer: Self-pay | Attending: Emergency Medicine

## 2024-10-14 ENCOUNTER — Ambulatory Visit (HOSPITAL_COMMUNITY)
Admission: RE | Admit: 2024-10-14 | Discharge: 2024-10-14 | Disposition: A | Attending: Emergency Medicine | Admitting: Emergency Medicine

## 2024-10-14 DIAGNOSIS — R911 Solitary pulmonary nodule: Secondary | ICD-10-CM | POA: Diagnosis present

## 2024-10-14 DIAGNOSIS — R590 Localized enlarged lymph nodes: Secondary | ICD-10-CM | POA: Diagnosis present

## 2024-10-14 HISTORY — PX: VIDEO BRONCHOSCOPY WITH ENDOBRONCHIAL NAVIGATION: SHX6175

## 2024-10-14 HISTORY — DX: Cardiac arrhythmia, unspecified: I49.9

## 2024-10-14 HISTORY — DX: Malignant (primary) neoplasm, unspecified: C80.1

## 2024-10-14 HISTORY — PX: VIDEO BRONCHOSCOPY WITH ENDOBRONCHIAL ULTRASOUND: SHX6177

## 2024-10-14 LAB — BASIC METABOLIC PANEL WITH GFR
Anion gap: 11 (ref 5–15)
BUN: 19 mg/dL (ref 8–23)
CO2: 25 mmol/L (ref 22–32)
Calcium: 8 mg/dL — ABNORMAL LOW (ref 8.9–10.3)
Chloride: 100 mmol/L (ref 98–111)
Creatinine, Ser: 1.67 mg/dL — ABNORMAL HIGH (ref 0.44–1.00)
GFR, Estimated: 31 mL/min — ABNORMAL LOW (ref >60)
Glucose, Bld: 94 mg/dL (ref 70–99)
Potassium: 3.3 mmol/L — ABNORMAL LOW (ref 3.5–5.1)
Sodium: 136 mmol/L (ref 135–145)

## 2024-10-14 LAB — CBC
HCT: 29.9 % — ABNORMAL LOW (ref 36.0–46.0)
Hemoglobin: 9.7 g/dL — ABNORMAL LOW (ref 12.0–15.0)
MCH: 30.9 pg (ref 26.0–34.0)
MCHC: 32.4 g/dL (ref 30.0–36.0)
MCV: 95.2 fL (ref 80.0–100.0)
Platelets: 270 K/uL (ref 150–400)
RBC: 3.14 MIL/uL — ABNORMAL LOW (ref 3.87–5.11)
RDW: 15.9 % — ABNORMAL HIGH (ref 11.5–15.5)
WBC: 7.3 K/uL (ref 4.0–10.5)
nRBC: 0 % (ref 0.0–0.2)

## 2024-10-14 MED ORDER — LIDOCAINE 2% (20 MG/ML) 5 ML SYRINGE
INTRAMUSCULAR | Status: DC | PRN
  Administered 2024-10-14: 40 mg via INTRAVENOUS

## 2024-10-14 MED ORDER — PROPOFOL 10 MG/ML IV BOLUS
INTRAVENOUS | Status: DC | PRN
  Administered 2024-10-14: 80 mg via INTRAVENOUS

## 2024-10-14 MED ORDER — ROCURONIUM BROMIDE 10 MG/ML (PF) SYRINGE
PREFILLED_SYRINGE | INTRAVENOUS | Status: DC | PRN
  Administered 2024-10-14: 60 mg via INTRAVENOUS
  Administered 2024-10-14: 10 mg via INTRAVENOUS

## 2024-10-14 MED ORDER — LACTATED RINGERS IV SOLN: INTRAVENOUS | Status: DC

## 2024-10-14 MED ORDER — ONDANSETRON HCL 4 MG/2ML IJ SOLN
INTRAMUSCULAR | Status: DC | PRN
  Administered 2024-10-14: 4 mg via INTRAVENOUS

## 2024-10-14 MED ORDER — ACETAMINOPHEN 500 MG PO TABS
1000.0000 mg | ORAL_TABLET | Freq: Once | ORAL | Status: AC
  Administered 2024-10-14: 1000 mg via ORAL
  Filled 2024-10-14: qty 2

## 2024-10-14 MED ORDER — FENTANYL CITRATE (PF) 250 MCG/5ML IJ SOLN
INTRAMUSCULAR | Status: DC | PRN
  Administered 2024-10-14: 100 ug via INTRAVENOUS

## 2024-10-14 MED ORDER — CHLORHEXIDINE GLUCONATE 0.12 % MT SOLN: 15.0000 mL | Freq: Once | OROMUCOSAL | Status: AC

## 2024-10-14 MED ORDER — MIDAZOLAM HCL (PF) 2 MG/2ML IJ SOLN: 0.5000 mg | Freq: Once | INTRAMUSCULAR | Status: DC | PRN

## 2024-10-14 MED ORDER — SUGAMMADEX SODIUM 200 MG/2ML IV SOLN
INTRAVENOUS | Status: DC | PRN
  Administered 2024-10-14: 200 mg via INTRAVENOUS

## 2024-10-14 MED ORDER — DEXAMETHASONE SOD PHOSPHATE PF 10 MG/ML IJ SOLN
INTRAMUSCULAR | Status: DC | PRN
  Administered 2024-10-14: 4 mg via INTRAVENOUS

## 2024-10-14 MED ORDER — FENTANYL CITRATE (PF) 100 MCG/2ML IJ SOLN
INTRAMUSCULAR | Status: AC
  Filled 2024-10-14: qty 2

## 2024-10-14 MED ORDER — OXYCODONE HCL 5 MG/5ML PO SOLN: 5.0000 mg | Freq: Once | ORAL | Status: DC | PRN

## 2024-10-14 MED ORDER — OXYCODONE HCL 5 MG PO TABS: 5.0000 mg | ORAL_TABLET | Freq: Once | ORAL | Status: DC | PRN

## 2024-10-14 MED ORDER — HYDROMORPHONE HCL 1 MG/ML IJ SOLN: 0.2500 mg | INTRAMUSCULAR | Status: DC | PRN

## 2024-10-14 MED ORDER — PHENYLEPHRINE HCL-NACL 20-0.9 MG/250ML-% IV SOLN
INTRAVENOUS | Status: DC | PRN
  Administered 2024-10-14: 20 ug/min via INTRAVENOUS

## 2024-10-14 MED ORDER — PROPOFOL 500 MG/50ML IV EMUL
INTRAVENOUS | Status: DC | PRN
  Administered 2024-10-14: 175 ug/kg/min via INTRAVENOUS

## 2024-10-14 MED ORDER — CHLORHEXIDINE GLUCONATE 0.12 % MT SOLN
OROMUCOSAL | Status: AC
  Administered 2024-10-14: 15 mL via OROMUCOSAL
  Filled 2024-10-14: qty 15

## 2024-10-14 MED ORDER — MEPERIDINE HCL 25 MG/ML IJ SOLN: 6.2500 mg | INTRAMUSCULAR | Status: DC | PRN

## 2024-10-14 SURGICAL SUPPLY — 1 items: NDL SUPERTRX PREMARK BIOPSY (NEEDLE) ×2 IMPLANT

## 2024-10-14 NOTE — Interval H&P Note (Signed)
 History and Physical Interval Note:  10/14/2024 11:16 AM  Taylor Bush  has presented today for surgery, with the diagnosis of head and neck cancer.  The various methods of treatment have been discussed with the patient and family. After consideration of risks, benefits and other options for treatment, the patient has consented to  Procedure(s): VIDEO BRONCHOSCOPY WITH ENDOBRONCHIAL NAVIGATION (Left) BRONCHOSCOPY, WITH EBUS (Bilateral) as a surgical intervention.  The patient's history has been reviewed, patient examined, no change in status, stable for surgery.  I have reviewed the patient's chart and labs.  Questions were answered to the patient's satisfaction.     Lamar GORMAN Chris

## 2024-10-14 NOTE — Anesthesia Postprocedure Evaluation (Signed)
 Anesthesia Post Note  Patient: MELISIA LEMING  Procedure(s) Performed: VIDEO BRONCHOSCOPY WITH ENDOBRONCHIAL NAVIGATION (Left) BRONCHOSCOPY, WITH EBUS (Bilateral)     Patient location during evaluation: PACU Anesthesia Type: General Level of consciousness: awake and alert, oriented and patient cooperative Pain management: pain level controlled Vital Signs Assessment: post-procedure vital signs reviewed and stable Respiratory status: spontaneous breathing, nonlabored ventilation and respiratory function stable Cardiovascular status: blood pressure returned to baseline and stable Postop Assessment: no apparent nausea or vomiting and able to ambulate Anesthetic complications: no   No notable events documented.  Last Vitals:  Vitals:   10/14/24 1550 10/14/24 1555  BP: 117/66   Pulse: 73 69  Resp: 18 (!) 22  Temp:    SpO2: 98% 93%    Last Pain:  Vitals:   10/14/24 1555  TempSrc:   PainSc: 0-No pain                 Havah Ammon,E. Carime Dinkel

## 2024-10-14 NOTE — Op Note (Signed)
 Procedure Note  Patient: Taylor Bush  [employer] Cios mobile C-arm was utilized to identify and biopsy left upper lobe ground glass pulmonary nodule.  Needle-in-lesion was confirmed using real-time Cios imaging, and images were uploaded to PACS.      Lamar Chris, MD, PhD 10/14/2024, 3:30 PM [facility] [phone] or if no answer before 7:00PM call [phone] For any issues after 7:00PM please call eLink [phone]

## 2024-10-14 NOTE — Op Note (Signed)
 Video Bronchoscopy with Endobronchial Ultrasound and Electromagnetic Navigation Procedure Note  Date of Operation: 10/14/2024  Pre-op Diagnosis: Left upper lobe nodule, mediastinal adenopathy  Post-op Diagnosis: Same  Surgeon: LAMAR CHRIS  Assistants: None  Anesthesia: General endotracheal anesthesia  Operation: Flexible video fiberoptic bronchoscopy with endobronchial ultrasound, robotic assisted navigation and biopsies.  Estimated Blood Loss: Minimal  Complications: None apparent  Indications and History: Taylor Bush is a 78 y.o. female with history of invasive squamous cell carcinoma of the tongue and cervical lymph nodes treated surgically and with XRT.  Her surveillance imaging showed mediastinal adenopathy and a mixed density left upper lobe pulmonary nodule.  Recommendation was made to achieve a tissue diagnosis using endobronchial ultrasound and robotic assisted navigational bronchoscopy. The risks, benefits, complications, treatment options and expected outcomes were discussed with the patient.  The possibilities of pneumothorax, pneumonia, reaction to medication, pulmonary aspiration, perforation of a viscus, bleeding, failure to diagnose a condition and creating a complication requiring transfusion or operation were discussed with the patient who freely signed the consent.    Description of Procedure: The patient was seen in the Preoperative Area, was examined and was deemed appropriate to proceed.  The patient was taken to [facility] Endoscopy room 3, identified as [name] and the procedure verified as Flexible Video Fiberoptic Bronchoscopy with robotic assisted navigation and endobronchial ultrasound.  A Time Out was held and the above information confirmed.   Robotic assisted navigation: Prior to the date of the procedure a high-resolution CT scan of the chest was performed. Utilizing ION software program a virtual tracheobronchial tree was generated to allow the creation of  distinct navigation pathways to the patient's parenchymal abnormalities. After being taken to the operating room general anesthesia was initiated and the patient  was orally intubated. The video fiberoptic bronchoscope was introduced via the endotracheal tube and a general inspection was performed which showed normal right and left lung anatomy. Aspiration of the bilateral mainstems was completed to remove any remaining secretions. Robotic catheter inserted into patient's endotracheal tube.   Target #1 left upper lobe nodule: The distinct navigation pathways prepared prior to this procedure were then utilized to navigate to patient's lesion identified on CT scan. The robotic catheter was secured into place and the vision probe was withdrawn.  Lesion location was approximated using fluoroscopy.  Local registration and targeting was performed using [employer] Cios mobile C-arm three-dimensional imaging. Under fluoroscopic guidance transbronchial needle biopsies and transbronchial cryoprobe biopsies were performed to be sent for cytology and pathology.  Needle-in-lesion was confirmed using Cios mobile C-arm.  A bronchioalveolar lavage was performed in the left upper lobe adjacent to the nodule and sent for cytology.  Under fluoroscopic guidance a single fiducial marker was placed adjacent to the nodule.   Endobronchial ultrasound: The robotic scope was then withdrawn and the endobronchial ultrasound was used to identify and characterize the peritracheal, hilar and bronchial lymph nodes. Inspection showed lymph node 0.5 cm at stations 4L and 4R.  There was also a larger subcarinal node at station 7.  Using real-time ultrasound guidance Wang needle biopsies were take from Station 4R, 7, 4L nodes and were sent for cytology.   At the end of the procedure a general airway inspection was performed and there was no evidence of active bleeding. The bronchoscope was removed.  The patient tolerated the  procedure well. There was no significant blood loss and there were no obvious complications. A post-procedural chest x-ray is pending.  Samples Target #1:  1. Transbronchial Wang needle biopsies from left upper lobe nodule 2. Transbronchial cryoprobe biopsies from left upper lobe nodule 3. Bronchoalveolar lavage from left upper lobe   EBUS Samples: 1. Wang needle biopsies from 4R node 2. Wang needle biopsies from 7 node 3. Wang needle biopsies from 4L node   Lamar Chris, MD, PhD 10/14/2024, 3:23 PM [facility]

## 2024-10-14 NOTE — Transfer of Care (Signed)
 Immediate Anesthesia Transfer of Care Note  Patient: Taylor Bush  Procedure(s) Performed: VIDEO BRONCHOSCOPY WITH ENDOBRONCHIAL NAVIGATION (Left) BRONCHOSCOPY, WITH EBUS (Bilateral)  Patient Location: PACU and Endoscopy Unit  Anesthesia Type:General  Level of Consciousness: awake, alert , and oriented  Airway & Oxygen Therapy: Patient Spontanous Breathing and Patient connected to face mask oxygen  Post-op Assessment: Report given to RN and Post -op Vital signs reviewed and stable  Post vital signs: Reviewed and stable  Last Vitals:  Vitals Value Taken Time  BP 128/64 10/14/24 15:36  Temp 36.3 C 10/14/24 15:36  Pulse 63 10/14/24 15:40  Resp 18 10/14/24 15:40  SpO2 99 % 10/14/24 15:40  Vitals shown include unfiled device data.  Last Pain:  Vitals:   10/14/24 1536  TempSrc: Temporal  PainSc: 0-No pain         Complications: No notable events documented.

## 2024-10-14 NOTE — Anesthesia Procedure Notes (Signed)
 Procedure Name: Intubation Date/Time: 10/14/2024 1:55 PM  Performed by: Roslynn Waddell LABOR, CRNAPre-anesthesia Checklist: Patient identified, Emergency Drugs available, Suction available and Patient being monitored Patient Re-evaluated:Patient Re-evaluated prior to induction Oxygen Delivery Method: Circle System Utilized Preoxygenation: Pre-oxygenation with 100% oxygen Induction Type: IV induction Ventilation: Mask ventilation without difficulty Laryngoscope Size: Glidescope and 3 Grade View: Grade I Tube type: Oral Tube size: 8.5 mm Number of attempts: 1 Airway Equipment and Method: Stylet and Oral airway Placement Confirmation: ETT inserted through vocal cords under direct vision, positive ETCO2 and breath sounds checked- equal and bilateral Secured at: 21 cm Tube secured with: Tape Dental Injury: Teeth and Oropharynx as per pre-operative assessment  Comments: Atraumatic induction/intubation. Dentition and oral mucosa as per preop.

## 2024-10-14 NOTE — Discharge Instructions (Signed)
 Flexible Bronchoscopy, Care After This sheet gives you information about how to care for yourself after your test. Your doctor may also give you more specific instructions. If you have problems or questions, contact your doctor. Follow these instructions at home: Eating and drinking When you are wide awake, your numbness is gone and your cough and gag reflexes have come back, you may: Start eating only soft foods. Slowly drink liquids. Six hours after the test, go back to your normal diet. Driving Do not drive for 24 hours if you were given a medicine to help you relax (sedative). Do not drive or use heavy machinery while taking prescription pain medicine. General instructions Take over-the-counter and prescription medicines only as told by your doctor. Return to your normal activities as told. Ask what activities are safe for you. Do not use any products that have nicotine or tobacco in them. This includes cigarettes and e-cigarettes. If you need help quitting, ask your doctor. Keep all follow-up visits as told by your doctor. This is important. It is very important if you had a tissue sample (biopsy) taken. Get help right away if: You have shortness of breath that gets worse. You get light-headed. You feel like you are going to pass out (faint). You have chest pain. You cough up: More than a little blood. More blood than before. Summary Do not use cigarettes. Do not use e-cigarettes. Seek care in the Emergency Department right away if you have chest pain or shortness of breath. Call or MyChart Message our office for any questions or problems at [phone].  Okay to restart your Plavix  and your Eliquis  on 10/15/2024.   This information is not intended to replace advice given to you by your health care provider. Make sure you discuss any questions you have with your health care provider.

## 2024-10-15 ENCOUNTER — Other Ambulatory Visit (HOSPITAL_COMMUNITY): Payer: Self-pay

## 2024-10-16 ENCOUNTER — Encounter (HOSPITAL_COMMUNITY): Payer: Self-pay | Admitting: Emergency Medicine

## 2024-10-16 LAB — CYTOLOGY - NON PAP

## 2024-10-16 NOTE — Progress Notes (Unsigned)
 Kenyotta Dorfman T. Nazarene Bunning, MD, CAQ Sports Medicine [facility] [address] [place], [address]  Phone: [phone]  FAX: [phone]  CHALESE PEACH - 78 y.o. female  MRN [id number]  Date of Birth: 04-29-1946  Date: 10/17/2024  PCP: Watt Mirza, MD  Referral: Watt Mirza, MD  Chief Complaint  Patient presents with   Annual Exam    Part 2 MWV 04/29/2024   Patient Care Team: Watt Mirza, MD as PCP - General Jordan, Peter M, MD as PCP - Cardiology (Cardiology) Jesus Oliphant, MD as Consulting Physician (Otolaryngology) Izell Domino, MD as Attending Physician (Radiation Oncology) Malmfelt, Delon CROME, RN as Oncology Nurse Navigator Autumn Millman, MD as Consulting Physician (Oncology) Shelah Lamar RAMAN, MD as Consulting Physician (Pulmonary Disease) Subjective:   VIANEY CANIGLIA is a 78 y.o. pleasant patient who presents with the following:  Discussed the use of AI scribe software for clinical note transcription with the patient, who gave verbal consent to proceed.  History of Present Illness DETTA MELLIN is a 78 year old female with tongue cancer who presents with hip pain and follow-up on recent cancer evaluations, and a health maintenance exam..  She recently underwent a lung bronchoscopy, with initial findings indicating no cancer in the lymph nodes or lungs.  I reviewed what was available, and she is going to discuss with pulmonology.  She has a history of tongue cancer.  She underwent a radical neck dissection to remove lymph nodes.  She experienced a fall two weeks ago, resulting in left hip pain that extends to her back, described as 'really sore.'  She does have a history of chronic back pain, as well.  Tramadol  has been ineffective in managing the pain. She experiences weakness in the left leg, causing it to give way occasionally. No limping is noted.  She has a dog bite on her foot that is healing slowly due to  previous arterial disease.  She is currently seeing Dr. Harden.  She experienced atrial fibrillation following a neck procedure and is currently on Eliquis  to reduce stroke risk.  She has had increased bruising.  She has macular degeneration, which affects her ability to drive, though she can still watch TV and read. She receives eye injections every six weeks.  Health Maintenance Summary Reviewed and updated, unless pt declines services.  Tobacco History Reviewed. Non-smoker Alcohol : No concerns, no excessive use Exercise Habits: Limited  STD concerns: none Drug Use: None Lumps or breast concerns: no  Patient is here for complete physical exam.  I last saw her 1 year ago.  PET scan did not show any metabolic activity in the lungs.  She also has paroxysmal A-fib, hypertension, hyperlipidemia, significant peripheral arterial disease as well as chronic kidney disease stage III.   Health Maintenance  Topic Date Due   COVID-19 Vaccine (6 - 2025-26 season) 07/08/2024   Medicare Annual Wellness (AWV)  04/29/2025   Colonoscopy  09/08/2025   DTaP/Tdap/Td (2 - Td or Tdap) 06/26/2034   Pneumococcal Vaccine: 50+ Years  Completed   Influenza Vaccine  Completed   Bone Density Scan  Completed   Hepatitis C Screening  Completed   Zoster Vaccines- Shingrix  Completed   Meningococcal B Vaccine  Aged Out   Mammogram  Discontinued    Immunization History  Administered Date(s) Administered   Fluad Quad(high Dose 65+) 09/07/2022   Fluad Trivalent(High Dose 65+) 08/30/2023   INFLUENZA, HIGH DOSE SEASONAL PF 08/23/2016, 08/24/2017, 08/16/2018,  08/11/2020, 08/26/2021, 08/22/2024   Influenza-Unspecified 07/08/2014, 08/08/2015, 08/08/2015   PFIZER(Purple Top)SARS-COV-2 Vaccination 01/01/2020, 01/22/2020, 09/10/2020   Pfizer Covid-19 Vaccine Bivalent Booster 43yrs & up 09/22/2021   Pfizer(Comirnaty)Fall Seasonal Vaccine 12 years and older 09/26/2023   Pneumococcal Conjugate-13 03/04/2015    Pneumococcal Polysaccharide-23 09/05/2012   Tdap 06/26/2024   Zoster Recombinant(Shingrix) 08/05/2019, 12/03/2019   Patient Active Problem List   Diagnosis Date Noted   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) 09/20/2024    Priority: High   Paroxysmal atrial fibrillation (HCC) 08/21/2024    Priority: High   Peripheral arterial disease 07/12/2024    Priority: High   CKD (chronic kidney disease) stage 3, GFR 30-59 ml/min (HCC) 11/29/2013    Priority: Medium    Mixed hyperlipidemia 12/15/2008    Priority: Medium    Essential hypertension 12/15/2008    Priority: Medium    Mediastinal adenopathy 10/14/2024   Lung nodule 10/07/2024   Dog bite of ankle, sequela 07/06/2024   Gangrene of left foot (HCC) 07/06/2024   B12 deficiency 09/13/2021   Allergic rhinitis    Anemia of chronic disease 12/16/2011   Atherosclerosis of native artery of extremity with intermittent claudication 06/17/2009   Lumbar back pain with radiculopathy affecting left lower extremity 06/03/2009   Open-angle glaucoma 10/27/2008    Past Medical History:  Diagnosis Date   Allergic rhinitis    Anemia in chronic kidney disease (CKD)    B12 deficiency 09/13/2021   Blood transfusion 2012   and 2013   Chronic low back pain    CKD (chronic kidney disease) stage 3, GFR 30-59 ml/min (HCC) 11/29/2013   HLD (hyperlipidemia)    HTN (hypertension)    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) 09/20/2024   Open-angle glaucoma    PAD (peripheral artery disease)    with intermittent claudication, ABI 0.72 on Rt and 0.73 on L when assessed in 2010.    Paroxysmal atrial fibrillation (HCC) 08/21/2024    Past Surgical History:  Procedure Laterality Date   ABDOMINAL AORTOGRAM W/LOWER EXTREMITY N/A 07/09/2024   Procedure: ABDOMINAL AORTOGRAM W/LOWER EXTREMITY;  Surgeon: Serene Gaile ORN, MD;  [facility];  Service: Cardiovascular;  Laterality: N/A;   abnormal vascular studies   05/07/2009   ABI's 70, R SFA > 50%^ blockage    APPLICATION OF WOUND VAC Left 07/12/2024   Procedure: APPLICATION, WOUND VAC;  Surgeon: Harden Jerona GAILS, MD;  Location: [facility] OR;  Service: Orthopedics;  Laterality: Left;   CATARACT EXTRACTION, BILATERAL  2011   EXCISION OF TONGUE LESION WITH LASER N/A 08/31/2015   Procedure: BIOPSY AND CO2 LASER OF TONGUE LESION ;  Surgeon: Ida Loader, MD;  Location: [facility];  Service: ENT;  Laterality: N/A;   EXCISION OF TONGUE LESION WITH LASER Left 06/29/2020   Procedure: EXCISION OF TONGUE LESION/CO2 laser w/biopsy;  Surgeon: Loader Ida, MD;  Location: [facility];  Service: ENT;  Laterality: Left;   EXCISION OF TONGUE LESION WITH LASER Bilateral 10/02/2023   Procedure: ORAL BIOPSY AND CO2 LASER ABLATATION OF TONGUE LESION;  Surgeon: Loader Ida, MD;  Location: [facility];  Service: ENT;  Laterality: Bilateral;   EXCISION ORAL LESION WITH CO2 LASER N/A 12/08/2021   Procedure: EXCISION OF ORAL ORAL CAVITY LESION WITH CO2 LASER;  Surgeon: Loader Ida, MD;  Location: [facility];  Service: ENT;  Laterality: N/A;   EYE SURGERY     GLOSSECTOMY, PARTIAL Left 06/17/2024   Procedure:  GLOSSECTOMY, PARTIAL;  Surgeon: Jesus Oliphant, MD;  Location: [facility] OR;  Service: ENT;  Laterality: Left;   INCISION AND DRAINAGE OF WOUND Left 07/12/2024   Procedure: IRRIGATION AND DEBRIDEMENT WOUND;  Surgeon: Harden Jerona GAILS, MD;  Location: [facility] OR;  Service: Orthopedics;  Laterality: Left;  LEFT FOOT DEBRIDEMENT   JOINT REPLACEMENT     LOWER EXTREMITY ANGIOGRAPHY  07/09/2024   Procedure: Lower Extremity Angiography;  Surgeon: Serene Gaile ORN, MD;  [facility]: [facility];  Service: Cardiovascular;;   LOWER EXTREMITY INTERVENTION  07/09/2024   Procedure: LOWER EXTREMITY INTERVENTION;  Surgeon: Serene Gaile ORN, MD;  [facility]: [facility];  Service: Cardiovascular;;   OTHER SURGICAL HISTORY     ganglion cyst removed left wrist    PERIPHERAL  INTRAVASCULAR LITHOTRIPSY  07/09/2024   Procedure: PERIPHERAL INTRAVASCULAR LITHOTRIPSY;  Surgeon: Serene Gaile ORN, MD;  Location: [facility];  Service: Cardiovascular;;   PERIPHERAL VASCULAR ATHERECTOMY  07/09/2024   Procedure: PERIPHERAL VASCULAR ATHERECTOMY;  Surgeon: Serene Gaile ORN, MD;  Location: [facility];  Service: Cardiovascular;;   RADICAL NECK DISSECTION Left 08/21/2024   Procedure: DISSECTION, NECK, RADICAL;  Surgeon: Jesus Oliphant, MD;  Location: [facility] OR;  Service: ENT;  Laterality: Left;   TOTAL HIP ARTHROPLASTY  10/03/2011   Procedure: TOTAL HIP ARTHROPLASTY;  Surgeon: Dempsey GAILS Moan;  Location: [facility];  Service: Orthopedics;  Laterality: Right;   TOTAL HIP ARTHROPLASTY  12/21/2011   Procedure: TOTAL HIP ARTHROPLASTY;  Surgeon: Dempsey GAILS Moan, MD;  Location: [facility];  Service: Orthopedics;  Laterality: Left;   TUBAL LIGATION     VIDEO BRONCHOSCOPY WITH ENDOBRONCHIAL NAVIGATION Left 10/14/2024   Procedure: VIDEO BRONCHOSCOPY WITH ENDOBRONCHIAL NAVIGATION;  Surgeon: Shelah Lamar RAMAN, MD;  Location: [facility];  Service: Pulmonary;  Laterality: Left;   VIDEO BRONCHOSCOPY WITH ENDOBRONCHIAL ULTRASOUND Bilateral 10/14/2024   Procedure: BRONCHOSCOPY, WITH EBUS;  Surgeon: Shelah Lamar RAMAN, MD;  Location: [facility];  Service: Pulmonary;  Laterality: Bilateral;    Family History  Problem Relation Age of Onset   Colon polyps Mother 60   Heart attack Father        87s   Stroke Neg Hx        also no PAD or aneurysm   Colon cancer Neg Hx    Esophageal cancer Neg Hx    Stomach cancer Neg Hx    Rectal cancer Neg Hx     Social History   Social History Narrative   Retired haematologist, 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     Past Medical History, Surgical History, Social History, Family History, Problem List, Medications, and Allergies have been reviewed and updated if relevant.  Review of Systems: Pertinent positives are listed above.  Otherwise, a full 14 point review of  systems has been done in full and it is negative except where it is noted positive.  Objective:   BP 120/60   Pulse 72   Temp 98 F (36.7 C) (Temporal)   Ht 5' (1.524 m)   Wt 137 lb 6 oz (62.3 kg)   SpO2 97%   BMI 26.83 kg/m  Ideal Body Weight: Weight in (lb) to have BMI = 25: 127.7 No results found.    09/20/2024   10:00 AM 04/29/2024   11:38 AM 05/03/2023   11:56 AM 02/23/2022   10:12 AM 09/13/2021    2:40 PM  Depression screen PHQ 2/9  Decreased Interest 0 0 0 0 0  Down, Depressed, Hopeless  0 0 0 0   PHQ - 2 Score 0 0 0 0 0     GEN: well developed, well nourished, no acute distress Eyes: conjunctiva and lids normal, PERRLA, EOMI ENT: TM clear, nares clear, oral exam WNL Neck: supple, no lymphadenopathy, no thyromegaly, no JVD Pulm: clear to auscultation and percussion, respiratory effort normal CV: regular rate and rhythm, S1-S2, no murmur, rub or gallop, no bruits Chest: no scars, masses, no lumps BREAST: breast exam declined GI: soft, non-tender; no hepatosplenomegaly, masses; active bowel sounds all quadrants GU: GU exam declined Lymph: no cervical, axillary or inguinal adenopathy MSK: Full motion of the left hip.  Strength is mildly decreased, 4/5 in flexion, abduction.  Abduction is 4/5.   No posterior pelvis tenderness or spinous process tenderness SKIN: clear, good turgor, color WNL, no rashes, lesions, or ulcerations Neuro: normal mental status, normal strength, sensation, and motion Psych: alert; oriented to person, place and time, normally interactive and not anxious or depressed in appearance.   All labs reviewed with patient.  Lab Review:     Latest Ref Rng & Units 10/14/2024   11:05 AM 09/08/2024    8:50 AM 09/05/2024    2:21 PM  CBC EXTENDED  WBC 4.0 - 10.5 K/uL 7.3  8.5  11.1   RBC 3.87 - 5.11 MIL/uL 3.14  3.10  3.02   Hemoglobin 12.0 - 15.0 g/dL 9.7  9.3  9.2   HCT 63.9 - 46.0 % 29.9  29.2  28.6   Platelets 150 - 400 K/uL 270  297  302    NEUT# 1.7 - 7.7 K/uL  6.1    Lymph# 0.7 - 4.0 K/uL  1.3         Latest Ref Rng & Units 10/14/2024   11:05 AM 09/08/2024    8:50 AM 09/05/2024    2:21 PM  BMP  Glucose 70 - 99 mg/dL 94  899  899   BUN 8 - 23 mg/dL 19  14  17    Creatinine 0.44 - 1.00 mg/dL 8.32  8.47  8.09   BUN/Creat Ratio 12 - 28   9   Sodium 135 - 145 mmol/L 136  139  140   Potassium 3.5 - 5.1 mmol/L 3.3  3.8  4.3   Chloride 98 - 111 mmol/L 100  103  100   CO2 22 - 32 mmol/L 25  25  20    Calcium  8.9 - 10.3 mg/dL 8.0  8.6  8.1        Latest Ref Rng & Units 07/06/2024    9:39 AM 09/14/2023    8:42 AM 09/07/2022    8:59 AM  Hepatic Function  Total Protein 6.5 - 8.1 g/dL 6.1  6.7  7.0   Albumin 3.5 - 5.0 g/dL 3.1  3.9  4.1   AST 15 - 41 U/L 17  12  13    ALT 0 - 44 U/L 13  9  10    Alk Phosphatase 38 - 126 U/L 53  64  54   Total Bilirubin 0.0 - 1.2 mg/dL 0.5  0.4  0.4   Bilirubin, Direct 0.0 - 0.3 mg/dL  0.1  0.1     Lab Results  Component Value Date   CHOL 129 07/10/2024   Lab Results  Component Value Date   HDL 41 07/10/2024   Lab Results  Component Value Date   LDLCALC 63 07/10/2024   Lab Results  Component Value Date   TRIG 123 07/10/2024  Lab Results  Component Value Date   CHOLHDL 3.1 07/10/2024   No results for input(s): PSA in the last 72 hours. Lab Results  Component Value Date   HCVAB NEGATIVE 04/27/2017   Lab Results  Component Value Date   VD25OH 27.56 (L) 09/14/2023   VD25OH 37.20 09/07/2022   VD25OH 26.75 (L) 09/13/2021     Lab Results  Component Value Date   HGBA1C 5.5 07/07/2024   HGBA1C 5.7 09/14/2023   HGBA1C 5.9 09/07/2022   Lab Results  Component Value Date   LDLCALC 63 07/10/2024   CREATININE 1.67 (H) 10/14/2024     Assessment and Plan:     ICD-10-CM   1. Healthcare maintenance  Z00.00     2. 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)  C02.3     3. Paroxysmal atrial fibrillation (HCC)  I48.0     4. Peripheral arterial disease   I73.9     5. Mixed hyperlipidemia  E78.2 CANCELED: Lipid panel    CANCELED: Basic metabolic panel    CANCELED: Hepatic Function Panel    6. Essential hypertension  I10     7. Stage 3a chronic kidney disease (HCC)  N18.31     8. Other iron  deficiency anemia  D50.8 CANCELED: IBC + Ferritin    CANCELED: CBC with Differential/Platelet    9. Abnormal blood sugar  R73.09 CANCELED: Hemoglobin A1c    10. Other fatigue  R53.83 CANCELED: CBC with Differential/Platelet    CANCELED: TSH    11. Vitamin D  deficiency  E55.9 CANCELED: VITAMIN D  25 Hydroxy (Vit-D Deficiency, Fractures)    12. B12 deficiency  E53.8 CANCELED: Vitamin B12     Assessment & Plan General health maintenance High risk for infections due to upcoming cancer treatment and medical comorbidities. Has not received a COVID booster or RSV vaccine this year. - Recommended COVID booster and RSV vaccine due to increased infection risk from cancer treatment.  Malignant neoplasm of anterior two-thirds of tongue Recent bronchoscopy showed no cancer in lymph nodes or lung, but all results are not back. Scheduled for radiation therapy next Wednesday. Awaiting further oncology recommendations regarding chemotherapy. - Proceed with radiation therapy as scheduled. - Await oncology recommendations regarding chemotherapy.  Left hip and back pain after fall Persistent left hip and back pain following a fall two weeks ago. Tramadol  is ineffective. Weakness in the left hip with occasional giving way. - Prescribed hydrocodone  30 tablets for pain management. - Instructed on home exercises to strengthen the hip: leg lifts, foot turns, and lateral leg lifts.  Paroxysmal atrial fibrillation Recent episode during neck surgery. Currently in normal sinus rhythm. On Eliquis  to reduce stroke risk. - Continue Eliquis  as prescribed.  Peripheral arterial disease with impaired wound healing Previous intervention to improve blood flow in the leg. Dog  bite on the foot is healing slowly. - Continue to see Dr. Harden  Essential hypertension Blood pressure is well-controlled.  Age-related macular degeneration Affecting driving ability. Receives regular eye injections every six weeks. - Continue regular eye injections every six weeks.  Health Maintenance Exam: The patient's preventative maintenance and recommended screening tests for an annual wellness exam were reviewed in full today. Brought up to date unless services declined.  Counselled on the importance of diet, exercise, and its role in overall health and mortality. The patient's FH and SH was reviewed, including their home life, tobacco status, and drug and alcohol  status.  Follow-up in 1 year for physical exam or additional follow-up below.  Disposition: No follow-ups on file.  Future Appointments  Date Time Provider Department Center  10/23/2024  1:45 PM [facility] NURSE [facility] None  10/23/2024  2:30 PM Izell Domino, MD [facility] None  10/29/2024  3:00 PM Pleas, Dipti, MD [facility] [address]  11/04/2024 10:30 AM Izell Domino, MD [facility] None  11/05/2024 10:45 AM [facility] LINAC 4 [facility] None  11/05/2024  2:20 PM Daneen Damien BROCKS, NP [facility] H&V  11/06/2024 11:30 AM [facility] LINAC 4 [facility] None  11/08/2024 11:30 AM [facility] LINAC 4 [facility] None  11/11/2024 10:45 AM [facility] LINAC 4 [facility] None  11/12/2024 10:45 AM [facility] LINAC 4 [facility] None  11/13/2024 10:45 AM [facility] LINAC 4 [facility] None  11/14/2024 10:45 AM [facility] LINAC 4 [facility] None  11/14/2024  2:00 PM Harden Jerona GAILS, MD [facility] None  11/15/2024 10:45 AM [facility] LINAC 4 [facility] None  11/18/2024 10:45 AM [facility] LINAC 4 [facility] None  11/19/2024 10:45 AM [facility] LINAC 4 [facility] None  11/20/2024 10:45 AM [facility] LINAC 4 [facility] None  11/21/2024 10:45 AM [facility] LINAC 4 [facility] None  11/22/2024 10:45 AM [facility] LINAC 4  [facility] None  11/25/2024 10:45 AM [facility] LINAC 4 [facility] None  11/26/2024 10:45 AM [facility] LINAC 4 [facility] None  11/27/2024 10:45 AM [facility] LINAC 4 [facility] None  11/28/2024 10:45 AM [facility] LINAC 4 [facility] None  11/29/2024 10:45 AM [facility] LINAC 4 [facility] None  12/02/2024 10:45 AM [facility] LINAC 4 [facility] None  12/02/2024  1:00 PM [facility] H&V  12/02/2024  2:00 PM [facility] H&V  12/02/2024  2:30 PM [facility] H&V  12/03/2024 10:45 AM [facility] LINAC 4 [facility] None  12/04/2024 10:30 AM [facility] LINAC 4 [facility] None  12/05/2024 10:30 AM [facility] LINAC 4 [facility] None  12/06/2024 10:30 AM [facility] LINAC 4 [facility] None  12/09/2024 10:30 AM [facility] LINAC 4 [facility] None  12/10/2024 10:30 AM [facility] LINAC 4 [facility] None  12/11/2024 10:30 AM [facility] LINAC 4 [facility] None  12/12/2024 10:30 AM [facility] LINAC 4 [facility] None  12/13/2024 10:30 AM [facility] LINAC 4 [facility] None  12/16/2024 10:30 AM [facility] LINAC 4 [facility] None  12/17/2024 10:30 AM [facility] LINAC 4 [facility] None  12/18/2024 10:30 AM [facility] LINAC 4 [facility] None  12/19/2024 10:30 AM [facility] LINAC 4 [facility] None  12/20/2024 10:30 AM [facility] LINAC 4 [facility] None  12/23/2024 10:30 AM [facility] LINAC 4 [facility] None  05/02/2025 11:30 AM [facility] ANNUAL WELLNESS VISIT 1 [facility] [address]    Meds ordered this encounter  Medications   HYDROcodone -acetaminophen  (NORCO/VICODIN) 5-325 MG tablet    Sig: Take 1 tablet by mouth every 6 (six) hours as needed for moderate pain (pain score 4-6).    Dispense:  30 tablet    Refill:  0   Medications Discontinued During This Encounter  Medication Reason   nitroGLYCERIN  (NITRODUR - DOSED IN MG/24 HR) 0.2 mg/hr patch Completed Course   ondansetron  (ZOFRAN -ODT) 4 MG disintegrating tablet Completed Course   senna-docusate (SENOKOT-S) 8.6-50 MG  tablet Completed Course   HYDROcodone -acetaminophen  (NORCO/VICODIN) 5-325 MG tablet Reorder   No orders of the defined types were placed in this encounter.   Signed,  Jacques DASEN. Elya Tarquinio, MD   Allergies as of 10/17/2024       Reactions   Prednisone Other (See Comments)   Joint pain,joint stiffness   Statins Rash   hands broke out        Medication List        Accurate as of October 17, 2024  11:59 PM. If you have any questions, ask your nurse or doctor.          STOP taking these medications    nitroGLYCERIN  0.2 mg/hr patch Commonly known as: NITRODUR - Dosed in mg/24 hr Stopped by: Jacques Schroeder, MD   senna-docusate 8.6-50 MG tablet Commonly known as: Senokot-S Stopped by: Jacques Schroeder, MD       TAKE these medications    amLODipine  10 MG tablet Commonly known as: NORVASC  TAKE 1 TABLET BY MOUTH DAILY   apixaban  5 MG Tabs tablet Commonly known as: ELIQUIS  Take 1 tablet (5 mg total) by mouth 2 (two) times daily.   atorvastatin  80 MG tablet Commonly known as: LIPITOR  Take 1 tablet (80 mg total) by mouth daily.   clopidogrel  75 MG tablet Commonly known as: PLAVIX  Take 1 tablet (75 mg total) by mouth daily with breakfast.   HYDROcodone -acetaminophen  5-325 MG tablet Commonly known as: NORCO/VICODIN Take 1 tablet by mouth every 6 (six) hours as needed for moderate pain (pain score 4-6).   lisinopril  20 MG tablet Commonly known as: ZESTRIL  TAKE 1 TABLET BY MOUTH DAILY   metoprolol  200 MG 24 hr tablet Commonly known as: TOPROL -XL TAKE 1 TABLET BY MOUTH DAILY   ondansetron  4 MG disintegrating tablet Commonly known as: ZOFRAN -ODT Take 1 tablet (4 mg total) by mouth every 8 (eight) hours as needed for nausea or vomiting. What changed: Another medication with the same name was removed. Continue taking this medication, and follow the directions you see here. Changed by: Jacques Schroeder, MD   sodium fluoride 1.1 % Gel dental gel Commonly known  as: FLUORISHIELD Apply thin layer of fluoride gel inside the trays and wear them for 20 minutes nightly. Start after completion of HNRT   traMADol  50 MG tablet Commonly known as: ULTRAM  Take 1 tablet (50 mg total) by mouth every 8 (eight) hours as needed.   VABYSMO  IZ 1 Dose by Intravitreal route every 6 (six) weeks.

## 2024-10-16 NOTE — Progress Notes (Signed)
 Has armband been applied?  Yes.    Does patient have an allergy to IV contrast dye?: No.   Has patient ever received premedication for IV contrast dye?: No.   Date of lab work: 10/14/2024 BUN: 19 CR: 1.67 eGFR: 31  Does patient take metformin?: No.  Is eGFR >60?: No. If no, when can patient resume? (Must be 48 hrs AFTER they receive IV contrast):    IV site: Right AC    There were no vitals taken for this visit.

## 2024-10-17 ENCOUNTER — Encounter: Payer: Self-pay | Admitting: Family Medicine

## 2024-10-17 ENCOUNTER — Ambulatory Visit (INDEPENDENT_AMBULATORY_CARE_PROVIDER_SITE_OTHER): Admitting: Family Medicine

## 2024-10-17 VITALS — BP 120/60 | HR 72 | Temp 98.0°F | Ht 60.0 in | Wt 137.4 lb

## 2024-10-17 DIAGNOSIS — R5383 Other fatigue: Secondary | ICD-10-CM | POA: Diagnosis not present

## 2024-10-17 DIAGNOSIS — I10 Essential (primary) hypertension: Secondary | ICD-10-CM | POA: Diagnosis not present

## 2024-10-17 DIAGNOSIS — E559 Vitamin D deficiency, unspecified: Secondary | ICD-10-CM

## 2024-10-17 DIAGNOSIS — E782 Mixed hyperlipidemia: Secondary | ICD-10-CM | POA: Diagnosis not present

## 2024-10-17 DIAGNOSIS — I739 Peripheral vascular disease, unspecified: Secondary | ICD-10-CM

## 2024-10-17 DIAGNOSIS — D508 Other iron deficiency anemias: Secondary | ICD-10-CM

## 2024-10-17 DIAGNOSIS — R7309 Other abnormal glucose: Secondary | ICD-10-CM | POA: Diagnosis not present

## 2024-10-17 DIAGNOSIS — E538 Deficiency of other specified B group vitamins: Secondary | ICD-10-CM

## 2024-10-17 DIAGNOSIS — Z0000 Encounter for general adult medical examination without abnormal findings: Secondary | ICD-10-CM | POA: Diagnosis not present

## 2024-10-17 DIAGNOSIS — C023 Malignant neoplasm of anterior two-thirds of tongue, part unspecified: Secondary | ICD-10-CM

## 2024-10-17 DIAGNOSIS — N1831 Chronic kidney disease, stage 3a: Secondary | ICD-10-CM | POA: Diagnosis not present

## 2024-10-17 DIAGNOSIS — I480 Paroxysmal atrial fibrillation: Secondary | ICD-10-CM

## 2024-10-17 LAB — CYTOLOGY - NON PAP

## 2024-10-17 MED ORDER — HYDROCODONE-ACETAMINOPHEN 5-325 MG PO TABS: 1.0000 | ORAL_TABLET | Freq: Four times a day (QID) | ORAL | 0 refills | Status: DC | PRN

## 2024-10-17 NOTE — Patient Instructions (Addendum)
 Covid booster  RSV    Hip Rehab:  Hip Flexion: Toe up to ceiling, laying on your back. Lift your whole leg, 3 sets. Work up to being able to do #30 with each set.  Hip elevations, Toe and leg turned out to side.  Lift whole leg, 3 sets. Work up to being able to do #30 with each set.  Hip Abductions: Lying on side, straight out to side. 3 sets, work up to being able to do #30 with each set.  At the beginning you may only be able to do a lot less, try to do #10.

## 2024-10-18 ENCOUNTER — Other Ambulatory Visit: Payer: Self-pay | Admitting: Oncology

## 2024-10-18 ENCOUNTER — Encounter: Payer: Self-pay | Admitting: Family Medicine

## 2024-10-18 DIAGNOSIS — C023 Malignant neoplasm of anterior two-thirds of tongue, part unspecified: Secondary | ICD-10-CM

## 2024-10-18 MED ORDER — DEXAMETHASONE 4 MG PO TABS: ORAL_TABLET | ORAL | 1 refills | Status: DC

## 2024-10-18 MED ORDER — ONDANSETRON HCL 8 MG PO TABS: 8.0000 mg | ORAL_TABLET | Freq: Three times a day (TID) | ORAL | 1 refills | Status: AC | PRN

## 2024-10-18 MED ORDER — LIDOCAINE-PRILOCAINE 2.5-2.5 % EX CREA: TOPICAL_CREAM | CUTANEOUS | 3 refills | Status: AC

## 2024-10-18 MED ORDER — PROCHLORPERAZINE MALEATE 10 MG PO TABS: 10.0000 mg | ORAL_TABLET | Freq: Four times a day (QID) | ORAL | 1 refills | Status: AC | PRN

## 2024-10-18 NOTE — Progress Notes (Signed)
 START ON PATHWAY REGIMEN - Head and Neck     A cycle is every 7 days, concurrent with RT:     Paclitaxel      Carboplatin   **Always confirm dose/schedule in your pharmacy ordering system**  Patient Characteristics: Oral Cavity, Postoperative without Neoadjuvant Therapy, High Risk Disease Classification: Oral Cavity AJCC T Category: T2 AJCC N Category: pN3b AJCC M Category: M0 AJCC 8 Stage Grouping: IVB Therapeutic Status: Postoperative without Neoadjuvant Therapy Risk Status: High Risk Intent of Therapy: Curative Intent, Discussed with Patient

## 2024-10-20 ENCOUNTER — Other Ambulatory Visit: Payer: Self-pay

## 2024-10-22 ENCOUNTER — Other Ambulatory Visit: Payer: Self-pay

## 2024-10-22 ENCOUNTER — Encounter (HOSPITAL_COMMUNITY): Payer: Self-pay

## 2024-10-22 DIAGNOSIS — R911 Solitary pulmonary nodule: Secondary | ICD-10-CM

## 2024-10-22 DIAGNOSIS — C023 Malignant neoplasm of anterior two-thirds of tongue, part unspecified: Secondary | ICD-10-CM

## 2024-10-22 DIAGNOSIS — C029 Malignant neoplasm of tongue, unspecified: Secondary | ICD-10-CM

## 2024-10-22 NOTE — Progress Notes (Signed)
 RE: GTUBE REVIEW Received: Today Luverne Aran, MD  Tommie Riggs Anatomy borderline by prior CT and PET; needs to ingest thin barium night before to opacify colon adequately.  GY       Previous Messages    ----- Message ----- From: Tommie Riggs Sent: 10/22/2024  11:26 AM EST To: Ir Procedure Requests Subject: GTUBE REVIEW                                  PROCEDURE: PORT & GTUBE PLACEMENT  RESASON: Chemotherapy with anticipated ineffective nurtritional intake.  Malignant neoplasm of anterior two-thirds of tongue; Tongue cancer; Lung nodule  HISTORY: PET 10/07/24  PHYSICIAN: PASAM

## 2024-10-23 ENCOUNTER — Ambulatory Visit: Admission: RE | Admit: 2024-10-23 | Discharge: 2024-10-23 | Attending: Radiation Oncology

## 2024-10-23 ENCOUNTER — Ambulatory Visit
Admission: RE | Admit: 2024-10-23 | Discharge: 2024-10-23 | Attending: Radiation Oncology | Admitting: Radiation Oncology

## 2024-10-23 VITALS — BP 151/77 | HR 86 | Temp 97.1°F | Resp 18 | Ht 60.0 in | Wt 134.2 lb

## 2024-10-23 DIAGNOSIS — J90 Pleural effusion, not elsewhere classified: Secondary | ICD-10-CM | POA: Insufficient documentation

## 2024-10-23 DIAGNOSIS — Z9221 Personal history of antineoplastic chemotherapy: Secondary | ICD-10-CM | POA: Insufficient documentation

## 2024-10-23 DIAGNOSIS — Z7901 Long term (current) use of anticoagulants: Secondary | ICD-10-CM | POA: Diagnosis not present

## 2024-10-23 DIAGNOSIS — Z510 Encounter for antineoplastic radiation therapy: Secondary | ICD-10-CM | POA: Insufficient documentation

## 2024-10-23 DIAGNOSIS — R918 Other nonspecific abnormal finding of lung field: Secondary | ICD-10-CM | POA: Insufficient documentation

## 2024-10-23 DIAGNOSIS — D630 Anemia in neoplastic disease: Secondary | ICD-10-CM | POA: Insufficient documentation

## 2024-10-23 DIAGNOSIS — C023 Malignant neoplasm of anterior two-thirds of tongue, part unspecified: Secondary | ICD-10-CM | POA: Insufficient documentation

## 2024-10-23 DIAGNOSIS — N183 Chronic kidney disease, stage 3 unspecified: Secondary | ICD-10-CM | POA: Diagnosis not present

## 2024-10-23 DIAGNOSIS — I739 Peripheral vascular disease, unspecified: Secondary | ICD-10-CM | POA: Diagnosis not present

## 2024-10-23 DIAGNOSIS — I700 Atherosclerosis of aorta: Secondary | ICD-10-CM | POA: Diagnosis not present

## 2024-10-23 DIAGNOSIS — C770 Secondary and unspecified malignant neoplasm of lymph nodes of head, face and neck: Secondary | ICD-10-CM | POA: Insufficient documentation

## 2024-10-23 DIAGNOSIS — Z79899 Other long term (current) drug therapy: Secondary | ICD-10-CM | POA: Diagnosis not present

## 2024-10-23 DIAGNOSIS — Z5111 Encounter for antineoplastic chemotherapy: Secondary | ICD-10-CM | POA: Diagnosis not present

## 2024-10-23 DIAGNOSIS — Z7902 Long term (current) use of antithrombotics/antiplatelets: Secondary | ICD-10-CM | POA: Diagnosis not present

## 2024-10-23 DIAGNOSIS — I129 Hypertensive chronic kidney disease with stage 1 through stage 4 chronic kidney disease, or unspecified chronic kidney disease: Secondary | ICD-10-CM | POA: Diagnosis not present

## 2024-10-23 DIAGNOSIS — E785 Hyperlipidemia, unspecified: Secondary | ICD-10-CM | POA: Diagnosis not present

## 2024-10-23 DIAGNOSIS — R110 Nausea: Secondary | ICD-10-CM | POA: Diagnosis not present

## 2024-10-23 DIAGNOSIS — I2510 Atherosclerotic heart disease of native coronary artery without angina pectoris: Secondary | ICD-10-CM | POA: Diagnosis not present

## 2024-10-23 DIAGNOSIS — M129 Arthropathy, unspecified: Secondary | ICD-10-CM | POA: Diagnosis not present

## 2024-10-23 NOTE — Addendum Note (Signed)
 Encounter addended by: Mohamed-Medani, Elverna LABOR, RN on: 10/23/2024 3:08 PM  Actions taken: LDA properties accepted

## 2024-10-23 NOTE — Progress Notes (Addendum)
 Oncology Nurse Navigator Documentation   Met with Ms. Aoun to provide PEG education prior to 11/01/24 placement.  Using  PEG teaching device and Teach Back, provided education for PEG use and care, including: hand hygiene, gravity bolus administration of daily water flushes and nutritional supplement, fluids and medications; care of tube insertion site including daily dressing change and cleaning; S&S of infection.   Ms. Holian correctly verbalized procedures for gravity administration of water, dressing change and site care.  I provided written instructions for PEG flushing/dressing change in support of verbal instruction.   I provided/described contents of Start of Care Bolus Feeding Kit (2 60 cc syringes, 1 box 4x4 drainage sponges, 1 package mesh briefs, 1 roll paper tape, 1 case Osmolite 1.5).  She voiced understanding he is to start using Osmolite per guidance of Nutrition. He understands I will be available for ongoing PEG support. Provided barium sulfate prep which I obtained from [facility] and reviewed instructions.    To provide support, encouragement and care continuity we discussed her CT simulation. She tolerated procedure without difficulty, denied questions/concerns.     I encouraged them to call me prior to her 11/04/24 [facility].   Delon Jefferson RN, BSN, OCN Head & Neck Oncology Nurse Navigator [facility] Phone # [phone]  Fax # [phone]

## 2024-10-25 NOTE — Progress Notes (Signed)
 Pharmacist Chemotherapy Monitoring - Initial Assessment    Anticipated start date: 11/04/24   The following has been reviewed per standard work regarding the patient's treatment regimen: The patient's diagnosis, treatment plan and drug doses, and organ/hematologic function Lab orders and baseline tests specific to treatment regimen  The treatment plan start date, drug sequencing, and pre-medications Prior authorization status  Patient's documented medication list, including drug-drug interaction screen and prescriptions for anti-emetics and supportive care specific to the treatment regimen The drug concentrations, fluid compatibility, administration routes, and timing of the medications to be used The patient's access for treatment and lifetime cumulative dose history, if applicable  The patient's medication allergies and previous infusion related reactions, if applicable   Changes made to treatment plan:  N/A  Follow up needed:  patient's allergy to PREDNISONE Inbasket msg to Dr. Autumn: Pt has allergy (joint pain/stiffness) w/ Prednisone. She has some steroids ordered w/ her tx regimen (IV premed, IV hypersensitivity PRN & PO home med).  MD to address if necessary.   Taylor Bush, Pharm.D., CPP 10/25/2024@1 :14 PM

## 2024-10-28 ENCOUNTER — Other Ambulatory Visit (HOSPITAL_COMMUNITY): Payer: Self-pay

## 2024-10-28 ENCOUNTER — Inpatient Hospital Stay: Attending: Oncology

## 2024-10-28 ENCOUNTER — Other Ambulatory Visit: Payer: Self-pay

## 2024-10-28 DIAGNOSIS — C023 Malignant neoplasm of anterior two-thirds of tongue, part unspecified: Secondary | ICD-10-CM

## 2024-10-28 MED ORDER — LIDOCAINE-PRILOCAINE 2.5-2.5 % EX CREA
TOPICAL_CREAM | CUTANEOUS | 3 refills | Status: AC
  Filled 2024-10-28: qty 30, 30d supply, fill #0

## 2024-10-29 ENCOUNTER — Ambulatory Visit

## 2024-10-29 ENCOUNTER — Other Ambulatory Visit: Payer: Self-pay

## 2024-10-29 ENCOUNTER — Other Ambulatory Visit: Payer: Self-pay | Admitting: Student

## 2024-10-29 VITALS — BP 132/70 | HR 66 | Temp 98.2°F | Ht 60.0 in | Wt 135.4 lb

## 2024-10-29 DIAGNOSIS — C023 Malignant neoplasm of anterior two-thirds of tongue, part unspecified: Secondary | ICD-10-CM

## 2024-10-29 DIAGNOSIS — R911 Solitary pulmonary nodule: Secondary | ICD-10-CM | POA: Diagnosis not present

## 2024-10-29 DIAGNOSIS — J90 Pleural effusion, not elsewhere classified: Secondary | ICD-10-CM

## 2024-10-29 DIAGNOSIS — R590 Localized enlarged lymph nodes: Secondary | ICD-10-CM

## 2024-10-29 DIAGNOSIS — Z87891 Personal history of nicotine dependence: Secondary | ICD-10-CM | POA: Diagnosis not present

## 2024-10-29 DIAGNOSIS — Z01812 Encounter for preprocedural laboratory examination: Secondary | ICD-10-CM

## 2024-10-29 NOTE — Patient Instructions (Signed)
 It was a pleasure to see you today. We will be following the nodule on left lung in your next visit Keep salt intake low

## 2024-10-29 NOTE — Progress Notes (Signed)
 "  New Patient Pulmonology Office Visit   Subjective:  Patient ID: Taylor Bush, female    DOB: [birth year]  MRN: [id number]  Referred by: Watt Mirza, MD  CC:  Chief Complaint  Patient presents with   Follow-up    Pt states states since LOV breathing has been okay     Nov 20/2025: HPI Taylor Bush is a 78 y.o. 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  Discussed the use of AI scribe software for clinical note transcription with the patient, who gave verbal consent to proceed.  History of Present Illness Taylor Bush is a 78 year old female with tongue cancer (bx 09/2023) who presents for evaluation of cancer spread and associated symptoms.  In 2024 she was diagnosed with cancer in her tongue after having a couple of spots removed, one of which was found to be cancerous.  She has history of recurrent oral leukoplakia.. She has seen a radiation doctor and is awaiting a PET scan to determine the extent of the cancer spread. A CT scan of her chest has shown enlarged lymph nodes and a small fluid pocket on the right side of her lung. No trouble swallowing or breathing.  She has a history of atrial fibrillation, which was discovered during a lymph node surgery when her heart went out of rhythm. She is currently taking Eliquis  for this condition and also takes metoprolol  for heart rate management. No shortness of breath and she is able to perform daily activities such as cooking and cleaning.  She experiences swelling in her legs, particularly her foot, which worsens with activity and improves with rest. She had stents placed in her foot due to closed veins, and the swelling has persisted since  then. Both legs are swollen, with the left  being more affected. She has hx of CKD  She has a history of smoking, which she quit years ago. She no longer drives due to vision issues, and her son assists with transportation to appointments. Her son is on dialysis    Interval hx 10/29/2024:  Underwent PET/CT-did not show concerning uptake within mediastinal lymphadenopathy.  The left upper lobe 8 mm nodule was persistent PET did show uptake within left cervical lymph node indicated for residual/recurrent disease. She underwent bronchoscopic biopsy on December 06/2024.  She is here to review results   ROS Review of symptoms negative except mentioned above   Allergies: Prednisone and Statins  Current Outpatient Medications:    amLODipine  (NORVASC ) 10 MG tablet, TAKE 1 TABLET BY MOUTH DAILY, Disp: 100 tablet, Rfl: 0   Faricimab -svoa (VABYSMO  IZ), 1 Dose by Intravitreal route every 6 (six) weeks., Disp: , Rfl:    HYDROcodone -acetaminophen  (NORCO/VICODIN) 5-325 MG tablet, Take 1 tablet by mouth every 6 (six) hours as needed for moderate pain (pain score 4-6)., Disp: 30 tablet, Rfl: 0   lisinopril  (ZESTRIL ) 20 MG tablet, TAKE 1 TABLET BY MOUTH DAILY, Disp: 100 tablet, Rfl: 0   metoprolol  (TOPROL -XL) 200 MG 24 hr tablet, TAKE 1 TABLET BY MOUTH DAILY, Disp: 100 tablet, Rfl: 0   prochlorperazine  (COMPAZINE ) 10 MG tablet, Take 1 tablet (10 mg total) by mouth every 6 (six) hours as needed for nausea or vomiting., Disp: 30 tablet, Rfl: 1  sodium fluoride (FLUORISHIELD) 1.1 % GEL dental gel, Apply thin layer of fluoride gel inside the trays and wear them for 20 minutes nightly. Start after completion of HNRT, Disp: , Rfl:    traMADol  (ULTRAM ) 50 MG tablet, Take 1 tablet (50 mg total) by mouth every 8 (eight) hours as needed., Disp: 40 tablet, Rfl: 2   apixaban  (ELIQUIS ) 5 MG TABS tablet, Take 1 tablet (5 mg total) by mouth 2 (two) times daily. (Patient not taking: Reported on 10/29/2024), Disp: 60  tablet, Rfl: 3   atorvastatin  (LIPITOR ) 80 MG tablet, Take 1 tablet (80 mg total) by mouth daily. (Patient not taking: Reported on 10/29/2024), Disp: 30 tablet, Rfl: 3   clopidogrel  (PLAVIX ) 75 MG tablet, Take 1 tablet (75 mg total) by mouth daily with breakfast. (Patient not taking: Reported on 10/29/2024), Disp: 30 tablet, Rfl: 3   dexamethasone  (DECADRON ) 4 MG tablet, Take 2 tablets daily for 2 days, start the day after chemotherapy. Take with food. (Patient not taking: Reported on 10/29/2024), Disp: 30 tablet, Rfl: 1   lidocaine -prilocaine  (EMLA ) cream, Apply to affected area once (Patient not taking: Reported on 10/29/2024), Disp: 30 g, Rfl: 3   ondansetron  (ZOFRAN ) 8 MG tablet, Take 1 tablet (8 mg total) by mouth every 8 (eight) hours as needed for nausea or vomiting. Start on the third day after chemotherapy. (Patient not taking: Reported on 10/29/2024), Disp: 30 tablet, Rfl: 1   ondansetron  (ZOFRAN -ODT) 4 MG disintegrating tablet, Take 1 tablet (4 mg total) by mouth every 8 (eight) hours as needed for nausea or vomiting. (Patient not taking: Reported on 10/29/2024), Disp: 20 tablet, Rfl: 0 Past Medical History:  Diagnosis Date   Allergic rhinitis    Anemia in chronic kidney disease (CKD)    B12 deficiency 09/13/2021   Blood transfusion 2012   and 2013   Chronic low back pain    CKD (chronic kidney disease) stage 3, GFR 30-59 ml/min (HCC) 11/29/2013   HLD (hyperlipidemia)    HTN (hypertension)    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) 09/20/2024   Open-angle glaucoma    PAD (peripheral artery disease)    with intermittent claudication, ABI 0.72 on Rt and 0.73 on L when assessed in 2010.    Paroxysmal atrial fibrillation (HCC) 08/21/2024   Past Surgical History:  Procedure Laterality Date   ABDOMINAL AORTOGRAM W/LOWER EXTREMITY N/A 07/09/2024   Procedure: ABDOMINAL AORTOGRAM W/LOWER EXTREMITY;  Surgeon: Serene Gaile ORN, MD;  Location: [facility];  Service:  Cardiovascular;  Laterality: N/A;   abnormal vascular studies  05/07/2009   ABI's 70, R SFA > 50%^ blockage    APPLICATION OF WOUND VAC Left 07/12/2024   Procedure: APPLICATION, WOUND VAC;  Surgeon: Harden Jerona GAILS, MD;  Location: [facility] OR;  Service: Orthopedics;  Laterality: Left;   CATARACT EXTRACTION, BILATERAL  2011   EXCISION OF TONGUE LESION WITH LASER N/A 08/31/2015   Procedure: BIOPSY AND CO2 LASER OF TONGUE LESION ;  Surgeon: Ida Loader, MD;  Location: [facility];  Service: ENT;  Laterality: N/A;   EXCISION OF TONGUE LESION WITH LASER Left 06/29/2020   Procedure: EXCISION OF TONGUE LESION/CO2 laser w/biopsy;  Surgeon: Loader Ida, MD;  Location: [facility];  Service: ENT;  Laterality: Left;   EXCISION OF TONGUE LESION WITH LASER Bilateral 10/02/2023   Procedure: ORAL BIOPSY AND CO2 LASER ABLATATION OF TONGUE LESION;  Surgeon: Loader Ida, MD;  Location[facility] OR;  Service: ENT;  Laterality:  Bilateral;   EXCISION ORAL LESION WITH CO2 LASER N/A 12/08/2021   Procedure: EXCISION OF ORAL ORAL CAVITY LESION WITH CO2 LASER;  Surgeon: Jesus Oliphant, MD;  Location: [facility];  Service: ENT;  Laterality: N/A;   EYE SURGERY     GLOSSECTOMY, PARTIAL Left 06/17/2024   Procedure: GLOSSECTOMY, PARTIAL;  Surgeon: Jesus Oliphant, MD;  Location: [facility] OR;  Service: ENT;  Laterality: Left;   INCISION AND DRAINAGE OF WOUND Left 07/12/2024   Procedure: IRRIGATION AND DEBRIDEMENT WOUND;  Surgeon: Harden Jerona GAILS, MD;  Location: [facility] OR;  Service: Orthopedics;  Laterality: Left;  LEFT FOOT DEBRIDEMENT   JOINT REPLACEMENT     LOWER EXTREMITY ANGIOGRAPHY  07/09/2024   Procedure: Lower Extremity Angiography;  Surgeon: Serene Gaile ORN, MD;  Location: [facility];  Service: Cardiovascular;;   LOWER EXTREMITY INTERVENTION  07/09/2024   Procedure: LOWER EXTREMITY INTERVENTION;  Surgeon: Serene Gaile ORN, MD;  Location: [facility];  Service: Cardiovascular;;   OTHER SURGICAL  HISTORY     ganglion cyst removed left wrist    PERIPHERAL INTRAVASCULAR LITHOTRIPSY  07/09/2024   Procedure: PERIPHERAL INTRAVASCULAR LITHOTRIPSY;  Surgeon: Serene Gaile ORN, MD;  Location: [facility];  Service: Cardiovascular;;   PERIPHERAL VASCULAR ATHERECTOMY  07/09/2024   Procedure: PERIPHERAL VASCULAR ATHERECTOMY;  Surgeon: Serene Gaile ORN, MD;  Location: [facility];  Service: Cardiovascular;;   RADICAL NECK DISSECTION Left 08/21/2024   Procedure: DISSECTION, NECK, RADICAL;  Surgeon: Jesus Oliphant, MD;  Location: [facility] OR;  Service: ENT;  Laterality: Left;   TOTAL HIP ARTHROPLASTY  10/03/2011   Procedure: TOTAL HIP ARTHROPLASTY;  Surgeon: Dempsey GAILS Moan;  Location: [facility];  Service: Orthopedics;  Laterality: Right;   TOTAL HIP ARTHROPLASTY  12/21/2011   Procedure: TOTAL HIP ARTHROPLASTY;  Surgeon: Dempsey GAILS Moan, MD;  Location: [facility];  Service: Orthopedics;  Laterality: Left;   TUBAL LIGATION     VIDEO BRONCHOSCOPY WITH ENDOBRONCHIAL NAVIGATION Left 10/14/2024   Procedure: VIDEO BRONCHOSCOPY WITH ENDOBRONCHIAL NAVIGATION;  Surgeon: Shelah Lamar RAMAN, MD;  Location[facility];  Service: Pulmonary;  Laterality: Left;   VIDEO BRONCHOSCOPY WITH ENDOBRONCHIAL ULTRASOUND Bilateral 10/14/2024   Procedure: BRONCHOSCOPY, WITH EBUS;  Surgeon: Shelah Lamar RAMAN, MD;  Location: [facility] ENDOSCOPY;  Service: Pulmonary;  Laterality: Bilateral;   Family History  Problem Relation Age of Onset   Colon polyps Mother 27   Heart attack Father        77s   Stroke Neg Hx        also no PAD or aneurysm   Colon cancer Neg Hx    Esophageal cancer Neg Hx    Stomach cancer Neg Hx    Rectal cancer Neg Hx    Social History   Socioeconomic History   Marital status: Widowed    Spouse name: Not on file   Number of children: Not on file   Years of education: Not on file   Highest education level: Not on file  Occupational History   Not on file  Tobacco Use   Smoking status: Former    Current  packs/day: 0.00    Types: Cigarettes    Quit date: 11/07/2008    Years since quitting: 15.9   Smokeless tobacco: Never  Vaping Use   Vaping status: Never Used  Substance and Sexual Activity   Alcohol  use: No   Drug use: No   Sexual activity: Not on file  Other Topics Concern   Not  on file  Social History Narrative   Retired haematologist, 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    Social Drivers of Health   Tobacco Use: Medium Risk (10/29/2024)   Patient History    Smoking Tobacco Use: Former    Smokeless Tobacco Use: Never    Passive Exposure: Not on file  Financial Resource Strain: Low Risk (04/29/2024)   Overall Financial Resource Strain (CARDIA)    Difficulty of Paying Living Expenses: Not hard at all  Food Insecurity: No Food Insecurity (09/20/2024)   Epic    Worried About Programme Researcher, Broadcasting/film/video in the Last Year: Never true    Ran Out of Food in the Last Year: Never true  Transportation Needs: No Transportation Needs (09/20/2024)   Epic    Lack of Transportation (Medical): No    Lack of Transportation (Non-Medical): No  Physical Activity: Insufficiently Active (04/29/2024)   Exercise Vital Sign    Days of Exercise per Week: 7 days    Minutes of Exercise per Session: 20 min  Stress: No Stress Concern Present (04/29/2024)   [employer] of Occupational Health - Occupational Stress Questionnaire    Feeling of Stress: Not at all  Social Connections: Moderately Isolated (08/21/2024)   Social Connection and Isolation Panel    Frequency of Communication with Friends and Family: More than three times a week    Frequency of Social Gatherings with Friends and Family: More than three times a week    Attends Religious Services: More than 4 times per year    Active Member of [employer] or Organizations: No    Attends Banker Meetings: Never    Marital Status: Widowed  Intimate Partner Violence: Not At Risk (09/20/2024)   Epic    Fear of Current or Ex-Partner: No    Emotionally  Abused: No    Physically Abused: No    Sexually Abused: No  Depression (PHQ2-9): Low Risk (09/20/2024)   Depression (PHQ2-9)    PHQ-2 Score: 0  Alcohol  Screen: Low Risk (04/29/2024)   Alcohol  Screen    Last Alcohol  Screening Score (AUDIT): 0  Housing: Unknown (09/20/2024)   Epic    Unable to Pay for Housing in the Last Year: No    Number of Times Moved in the Last Year: Not on file    Homeless in the Last Year: No  Utilities: Not At Risk (09/20/2024)   Epic    Threatened with loss of utilities: No  Health Literacy: Adequate Health Literacy (04/29/2024)   B1300 Health Literacy    Frequency of need for help with medical instructions: Never         Objective:  BP 132/70   Pulse 66   Temp 98.2 F (36.8 C) (Oral)   Ht 5' (1.524 m) Comment: PER PATIENT  Wt 135 lb 6.4 oz (61.4 kg)   SpO2 97% Comment: on RA  BMI 26.44 kg/m    Physical Exam Constitutional:      General: She is not in acute distress.    Appearance: Normal appearance.  HENT:     Mouth/Throat:     Mouth: Mucous membranes are moist.  Cardiovascular:     Rate and Rhythm: Normal rate.  Pulmonary:     Effort: No respiratory distress.     Breath sounds: No wheezing or rales.  Musculoskeletal:     Right lower leg: Edema present.     Left lower leg: Edema (left foot on a partial boot) present.  Skin:  General: Skin is warm.  Neurological:     Mental Status: She is alert and oriented to person, place, and time.  Psychiatric:        Mood and Affect: Mood normal.     Diagnostic Review:    Pft     No data to display               Results  Endobronchial ultrasound-fine-needle aspiration of 4L, 4 and station 7 did not show any malignant cells  Robotic navigational transbronchial fine-needle aspiration and biopsy of left upper lobe nodule showed rare atypical cells-favor reactive   PET/CT did not show uptake within mediastinal lymph node.  Dates of uptake in left cervical lymph node Small left  pulmonary nodule was persistent   RADIOLOGY Chest CT: Mediastinal lymphadenopathy, small pleural effusion on the right side, and a pulmonary nodule on the left side. (09/19/2024)   Soft tissue neck CT with contrast on 09/18/24 which demonstrated: a prominent heterogeneous left level 3/4 cervical lymph node measuring 1.0 x 1.2 cm with central hypoattenuation concerning for possible central necrosis; subtle asymmetric soft tissue at the left posterior tongue (likely related to recent surgical changes); and postsurgical changes of the left neck with presumed surgical absence of the left submandibular gland.  -- CT of the abdomen on 09/18/24 showed no evidence of metastatic disease in the abdomen or acute findings overall.     Echo 08/2024 1. Left ventricular ejection fraction, by estimation, is 55 to 60%. The  left ventricle has normal function. The left ventricle has no regional  wall motion abnormalities. There is mild concentric left ventricular  hypertrophy. Left ventricular diastolic  parameters are indeterminate.   2. Right ventricular systolic function is moderately reduced. The right  ventricular size is normal.   3. Left atrial size was severely dilated.   4. The mitral valve is degenerative. Mild mitral valve regurgitation. No  evidence of mitral stenosis.   5. Tricuspid valve regurgitation is mild to moderate.   6. The aortic valve is normal in structure. Aortic valve regurgitation is  not visualized. Aortic valve sclerosis is present, with no evidence of  aortic valve stenosis.   7. The inferior vena cava is normal in size with greater than 50%  respiratory variability, suggesting right atrial pressure of 3 mmHg.     Path reports from Nov 03/2024 A. LEFT MODIFIED NECK DISSECTION, LEVELS I, II, III:  Three of thirty lymph nodes with metastatic well to moderately  differentiated squamous cell carcinoma (3/30, pN2b)  Largest involved node: 1.9 cm level II lymph node  Benign  submandibular and minor salivary gland    Assessment & Plan:   Assessment & Plan 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) Plan for PEG tube placement this Friday Management as per ENT and oncology    Lung nodule Reactive cells, favoring atypical cells on robotic bronchoscopic biopsy We will follow this nodule radiologically-next CT chest in 73-month-will be ordered at next visit depending on her oncologic scans by that time    Mediastinal lymphadenopathy Underwent needle aspiration of station 4R, 4L and 7.  Lymphocytes present.  No malignant cells identified.  No uptake on PET/CT The lymphadenopathy is likely from congestive changes from CHF    Pleural effusion, right Small.  Too small to drain.  Likely related to chronic diastolic congestive heart failure      Thank you for the opportunity to take part in the care of Taylor Bush   Return  in about 5 months (around 03/29/2025).   Luwana Butrick Pleas, MD [facility] Office: [phone]  "

## 2024-10-29 NOTE — Assessment & Plan Note (Addendum)
 Reactive cells, favoring atypical cells on robotic bronchoscopic biopsy We will follow this nodule radiologically-next CT chest in 43-month-will be ordered at next visit depending on her oncologic scans by that time

## 2024-10-29 NOTE — H&P (Addendum)
 "   Chief Complaint: malignant neoplasm of anterior two-thirds of tongue  Referring Provider(s): Pasam,Avinash  Supervising Physician: Hughes Simmonds  Patient Status: Taylor Bush - Out-pt  History of Present Illness: Taylor Bush is a 78 y.o. female with malignant neoplasm of anterior two-thirds of tongue. She had a left partial glossectomy on 06/17/2024 and neck dissection on 08/21/2024.   CT chest on 09/18/2024 revealed: 1. Lower cervical lymphadenopathy characterized on CT neck performed at the same time and reported separately. 2. Mild mediastinal lymphadenopathy. Metastatic involvement not excluded. PET-CT suggested to further evaluate. 3. 8 mm anterior left upper lobe pulmonary nodule shows mixed attenuation with sub solid features. This nodule warrants close follow-up as metastatic disease or lung primary not excluded. PET-CT may prove helpful. 4. Small right pleural effusion with dependent atelectasis right lower lobe. 5. Aortic Atherosclerosis (ICD10-I70.0) and Emphysema (ICD10-J43.9).  PET scan on 10/01/2024 revealed: 1. Hypermetabolic left cervical lymph node, indicative of residual/recurrent disease. 2. Hypermetabolic 4 mm parotid nodule, worrisome for malignancy. 3. No abnormal hypermetabolism associated with the left posterior tongue, as questioned on CT neck 09/18/2024. 4. Small right pleural effusion. 5. Aortic atherosclerosis (ICD10-I70.0). Coronary artery calcification. 6. Enlarged pulmonic trunk, indicative of pulmonary arterial hypertension.  Received request for image guided placement of port a catheter and gastrostomy tube for treatment..  Confirms NPO since MN and ride/supervision available for 24 hours. Last Eliquis  12/24. Denies fever, chills, SHOB, CP, sore throat, N/V/D, abdominal pain, blood in stool or urine, abnormal bruising, leg swelling, back pain, dizziness, lightheadedness, headache, dysphagia, pain.  Reports feeling well overall and being able to  tolerate PO without complications at this time.  Allergies Reviewed:  Statins   Patient is Full Code  Past Medical History:  Diagnosis Date   Allergic rhinitis    Anemia in chronic kidney disease (CKD)    B12 deficiency 09/13/2021   Blood transfusion 2012   and 2013   Chronic low back pain    CKD (chronic kidney disease) stage 3, GFR 30-59 ml/min (HCC) 11/29/2013   HLD (hyperlipidemia)    HTN (hypertension)    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) 09/20/2024   Open-angle glaucoma    PAD (peripheral artery disease)    with intermittent claudication, ABI 0.72 on Rt and 0.73 on L when assessed in 2010.    Paroxysmal atrial fibrillation (HCC) 08/21/2024    Past Surgical History:  Procedure Laterality Date   ABDOMINAL AORTOGRAM W/LOWER EXTREMITY N/A 07/09/2024   Procedure: ABDOMINAL AORTOGRAM W/LOWER EXTREMITY;  Surgeon: Serene Gaile ORN, MD;  [facility]: [facility];  Service: Cardiovascular;  Laterality: N/A;   abnormal vascular studies  05/07/2009   ABI's 70, R SFA > 50%^ blockage    APPLICATION OF WOUND VAC Left 07/12/2024   Procedure: APPLICATION, WOUND VAC;  Surgeon: Harden Jerona GAILS, MD;  Location: [facility] OR;  Service: Orthopedics;  Laterality: Left;   CATARACT EXTRACTION, BILATERAL  2011   EXCISION OF TONGUE LESION WITH LASER N/A 08/31/2015   Procedure: BIOPSY AND CO2 LASER OF TONGUE LESION ;  Surgeon: Ida Loader, MD;  Location: [facility];  Service: ENT;  Laterality: N/A;   EXCISION OF TONGUE LESION WITH LASER Left 06/29/2020   Procedure: EXCISION OF TONGUE LESION/CO2 laser w/biopsy;  Surgeon: Loader Ida, MD;  Location: [facility];  Service: ENT;  Laterality: Left;   EXCISION OF TONGUE LESION WITH LASER Bilateral 10/02/2023   Procedure: ORAL BIOPSY AND CO2 LASER ABLATATION OF TONGUE LESION;  Surgeon: Jesus Oliphant, MD;  Location: [facility] OR;  Service: ENT;  Laterality: Bilateral;   EXCISION ORAL LESION WITH CO2 LASER N/A  12/08/2021   Procedure: EXCISION OF ORAL ORAL CAVITY LESION WITH CO2 LASER;  Surgeon: Jesus Oliphant, MD;  Location: [facility];  Service: ENT;  Laterality: N/A;   EYE SURGERY     GLOSSECTOMY, PARTIAL Left 06/17/2024   Procedure: GLOSSECTOMY, PARTIAL;  Surgeon: Jesus Oliphant, MD;  Location: [facility] OR;  Service: ENT;  Laterality: Left;   INCISION AND DRAINAGE OF WOUND Left 07/12/2024   Procedure: IRRIGATION AND DEBRIDEMENT WOUND;  Surgeon: Harden Jerona GAILS, MD;  Location: [facility] OR;  Service: Orthopedics;  Laterality: Left;  LEFT FOOT DEBRIDEMENT   JOINT REPLACEMENT     LOWER EXTREMITY ANGIOGRAPHY  07/09/2024   Procedure: Lower Extremity Angiography;  Surgeon: Serene Gaile ORN, MD;  Location: [facility];  Service: Cardiovascular;;   LOWER EXTREMITY INTERVENTION  07/09/2024   Procedure: LOWER EXTREMITY INTERVENTION;  Surgeon: Serene Gaile ORN, MD;  Location: [facility];  Service: Cardiovascular;;   OTHER SURGICAL HISTORY     ganglion cyst removed left wrist    PERIPHERAL INTRAVASCULAR LITHOTRIPSY  07/09/2024   Procedure: PERIPHERAL INTRAVASCULAR LITHOTRIPSY;  Surgeon: Serene Gaile ORN, MD;  Location: [facility];  Service: Cardiovascular;;   PERIPHERAL VASCULAR ATHERECTOMY  07/09/2024   Procedure: PERIPHERAL VASCULAR ATHERECTOMY;  Surgeon: Serene Gaile ORN, MD;  Location: [facility];  Service: Cardiovascular;;   RADICAL NECK DISSECTION Left 08/21/2024   Procedure: DISSECTION, NECK, RADICAL;  Surgeon: Jesus Oliphant, MD;  Location: [facility] OR;  Service: ENT;  Laterality: Left;   TOTAL HIP ARTHROPLASTY  10/03/2011   Procedure: TOTAL HIP ARTHROPLASTY;  Surgeon: Dempsey GAILS Moan;  Location: [facility];  Service: Orthopedics;  Laterality: Right;   TOTAL HIP ARTHROPLASTY  12/21/2011   Procedure: TOTAL HIP ARTHROPLASTY;  Surgeon: Dempsey GAILS Moan, MD;  Location: [facility];  Service: Orthopedics;  Laterality: Left;   TUBAL LIGATION     VIDEO BRONCHOSCOPY WITH ENDOBRONCHIAL NAVIGATION Left  10/14/2024   Procedure: VIDEO BRONCHOSCOPY WITH ENDOBRONCHIAL NAVIGATION;  Surgeon: Shelah Lamar RAMAN, MD;  Location: [facility] ENDOSCOPY;  Service: Pulmonary;  Laterality: Left;   VIDEO BRONCHOSCOPY WITH ENDOBRONCHIAL ULTRASOUND Bilateral 10/14/2024   Procedure: BRONCHOSCOPY, WITH EBUS;  Surgeon: Shelah Lamar RAMAN, MD;  Location: [facility]OSCOPY;  Service: Pulmonary;  Laterality: Bilateral;      Medications: Prior to Admission medications  Medication Sig Start Date End Date Taking? Authorizing Provider  amLODipine  (NORVASC ) 10 MG tablet TAKE 1 TABLET BY MOUTH DAILY 08/05/24   Copland, Jacques, MD  apixaban  (ELIQUIS ) 5 MG TABS tablet Take 1 tablet (5 mg total) by mouth 2 (two) times daily. 08/24/24   Henry Manuelita NOVAK, NP  atorvastatin  (LIPITOR ) 80 MG tablet Take 1 tablet (80 mg total) by mouth daily. 07/14/24   Akula, Vijaya, MD  clopidogrel  (PLAVIX ) 75 MG tablet Take 1 tablet (75 mg total) by mouth daily with breakfast. 07/14/24   Cherlyn Labella, MD  dexamethasone  (DECADRON ) 4 MG tablet Take 2 tablets daily for 2 days, start the day after chemotherapy. Take with food. 10/18/24   Pasam, Chinita, MD  Faricimab -svoa (VABYSMO  IZ) 1 Dose by Intravitreal route every 6 (six) weeks.    [provider]  HYDROcodone -acetaminophen  (NORCO/VICODIN) 5-325 MG tablet Take 1 tablet by mouth every 6 (six) hours as needed for moderate pain (pain score 4-6). 10/17/24   Copland, Jacques, MD  lidocaine -prilocaine  (EMLA ) cream Apply to  affected area once 10/28/24   Pasam, Avinash, MD  lisinopril  (ZESTRIL ) 20 MG tablet TAKE 1 TABLET BY MOUTH DAILY 08/05/24   Copland, Jacques, MD  metoprolol  (TOPROL -XL) 200 MG 24 hr tablet TAKE 1 TABLET BY MOUTH DAILY 08/05/24   Copland, Jacques, MD  ondansetron  (ZOFRAN ) 8 MG tablet Take 1 tablet (8 mg total) by mouth every 8 (eight) hours as needed for nausea or vomiting. Start on the third day after chemotherapy. 10/18/24   Pasam, Chinita, MD  ondansetron  (ZOFRAN -ODT) 4 MG disintegrating tablet  Take 1 tablet (4 mg total) by mouth every 8 (eight) hours as needed for nausea or vomiting. 06/18/24   Jesus Oliphant, MD  prochlorperazine  (COMPAZINE ) 10 MG tablet Take 1 tablet (10 mg total) by mouth every 6 (six) hours as needed for nausea or vomiting. 10/18/24   Pasam, Chinita, MD  sodium fluoride (FLUORISHIELD) 1.1 % GEL dental gel Apply thin layer of fluoride gel inside the trays and wear them for 20 minutes nightly. Start after completion of HNRT 09/23/24   [provider]  traMADol  (ULTRAM ) 50 MG tablet Take 1 tablet (50 mg total) by mouth every 8 (eight) hours as needed. 08/24/24   CoplandJacques, MD     Family History  Problem Relation Age of Onset   Colon polyps Mother 9   Heart attack Father        76s   Stroke Neg Hx        also no PAD or aneurysm   Colon cancer Neg Hx    Esophageal cancer Neg Hx    Stomach cancer Neg Hx    Rectal cancer Neg Hx     Social History   Socioeconomic History   Marital status: Widowed    Spouse name: Not on file   Number of children: Not on file   Years of education: Not on file   Highest education level: Not on file  Occupational History   Not on file  Tobacco Use   Smoking status: Former    Current packs/day: 0.00    Types: Cigarettes    Quit date: 11/07/2008    Years since quitting: 15.9   Smokeless tobacco: Never  Vaping Use   Vaping status: Never Used  Substance and Sexual Activity   Alcohol  use: No   Drug use: No   Sexual activity: Not on file  Other Topics Concern   Not on file  Social History Narrative   Retired haematologist, 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    Social Drivers of Health   Tobacco Use: Medium Risk (10/29/2024)   Patient History    Smoking Tobacco Use: Former    Smokeless Tobacco Use: Never    Passive Exposure: Not on file  Financial Resource Strain: Low Risk (04/29/2024)   Overall Financial Resource Strain (CARDIA)    Difficulty of Paying Living Expenses: Not hard at all  Food Insecurity: No  Food Insecurity (09/20/2024)   Epic    Worried About Programme Researcher, Broadcasting/film/video in the Last Year: Never true    Ran Out of Food in the Last Year: Never true  Transportation Needs: No Transportation Needs (09/20/2024)   Epic    Lack of Transportation (Medical): No    Lack of Transportation (Non-Medical): No  Physical Activity: Insufficiently Active (04/29/2024)   Exercise Vital Sign    Days of Exercise per Week: 7 days    Minutes of Exercise per Session: 20 min  Stress: No Stress Concern Present (04/29/2024)  [employer] of Occupational Health - Occupational Stress Questionnaire    Feeling of Stress: Not at all  Social Connections: Moderately Isolated (08/21/2024)   Social Connection and Isolation Panel    Frequency of Communication with Friends and Family: More than three times a week    Frequency of Social Gatherings with Friends and Family: More than three times a week    Attends Religious Services: More than 4 times per year    Active Member of [employer] or Organizations: No    Attends Banker Meetings: Never    Marital Status: Widowed  Depression (PHQ2-9): Low Risk (09/20/2024)   Depression (PHQ2-9)    PHQ-2 Score: 0  Alcohol  Screen: Low Risk (04/29/2024)   Alcohol  Screen    Last Alcohol  Screening Score (AUDIT): 0  Housing: Unknown (09/20/2024)   Epic    Unable to Pay for Housing in the Last Year: No    Number of Times Moved in the Last Year: Not on file    Homeless in the Last Year: No  Utilities: Not At Risk (09/20/2024)   Epic    Threatened with loss of utilities: No  Health Literacy: Adequate Health Literacy (04/29/2024)   B1300 Health Literacy    Frequency of need for help with medical instructions: Never     Review of Systems: A 12 point ROS discussed and pertinent positives are indicated in the HPI above.  All other systems are negative.    Vital Signs: BP (!) 148/78 (BP Location: Right Arm)   Pulse 68   Temp (!) 97 F (36.1 C) (Axillary)   Resp 17    Ht 5' (1.524 m)   Wt 133 lb (60.3 kg)   SpO2 99%   BMI 25.97 kg/m   Advance Care Plan: no documents on file   Physical Exam HENT:     Mouth/Throat:     Mouth: Mucous membranes are moist.  Cardiovascular:     Rate and Rhythm: Normal rate and regular rhythm.  Pulmonary:     Effort: Pulmonary effort is normal. No respiratory distress.     Breath sounds: Normal breath sounds.  Abdominal:     General: Bowel sounds are normal.     Palpations: Abdomen is soft.     Tenderness: There is no abdominal tenderness.  Skin:    General: Skin is warm and dry.  Neurological:     Mental Status: She is alert and oriented to person, place, and time.  Psychiatric:        Mood and Affect: Mood normal.        Behavior: Behavior normal.     Imaging: DG Chest Port 1 View Result Date: 10/14/2024 CLINICAL DATA:  Status post bronchoscopy with biopsy EXAM: PORTABLE CHEST 1 VIEW COMPARISON:  08/21/2024, PET CT 10/01/2024, chest CT 10/11/2024 FINDINGS: Mild cardiomegaly. Fiducial marker now evident in the left upper lobe with surrounding vague pulmonary opacity presumably post biopsy change. Negative for pleural effusion. No definitive pneumothorax is seen. Aortic atherosclerosis IMPRESSION: Fiducial marker in the left upper lobe with surrounding vague pulmonary opacity presumably post biopsy change. No definitive pneumothorax. Electronically Signed   By: Luke Bun M.D.   On: 10/14/2024 16:16   DG C-ARM BRONCHOSCOPY Result Date: 10/14/2024 C-ARM BRONCHOSCOPY: Fluoroscopy was utilized by the requesting physician.  No radiographic interpretation.   DG C-Arm 1-60 Min-No Report Result Date: 10/14/2024 Fluoroscopy was utilized by the requesting physician.  No radiographic interpretation.   CT SUPER D CHEST WO CONTRAST Result Date:  10/11/2024 CLINICAL DATA:  8 mm left upper lobe nodule. Tongue cancer. * Tracking Code: BO * EXAM: CT CHEST WITHOUT CONTRAST TECHNIQUE: Multidetector CT imaging of the chest  was performed using thin slice collimation for electromagnetic bronchoscopy planning purposes, without intravenous contrast. RADIATION DOSE REDUCTION: This exam was performed according to the departmental dose-optimization program which includes automated exposure control, adjustment of the mA and/or kV according to patient size and/or use of iterative reconstruction technique. COMPARISON:  09/18/2024. FINDINGS: Cardiovascular: Atherosclerotic calcification of the aorta, aortic valve and coronary arteries. Enlarged pulmonic trunk and heart. No pericardial effusion. Mediastinum/Nodes: Mediastinal lymph nodes measure up to 9 mm in the low right paratracheal station, similar. Hilar regions are difficult to definitively evaluate without IV contrast. No axillary adenopathy. Edema extending from the left shoulder into the left axilla, partially imaged and new from 09/18/2024. Question recent intervention or trauma. Esophagus is grossly unremarkable. Lungs/Pleura: Centrilobular emphysema. Somewhat vague anterior segment left upper lobe nodule measures 7 mm (6 x 8 mm, 8/35), unchanged from 09/18/2024. Calcified granulomas. Scarring in the lingula. Small bilateral pleural effusions with mild dependent atelectasis. Airway is unremarkable. Upper Abdomen: Left renal vascular calcifications. Visualized portions of the liver, gallbladder, adrenal glands, kidneys, spleen, pancreas, stomach and bowel are otherwise grossly unremarkable. No upper abdominal adenopathy. Musculoskeletal: Degenerative changes in the spine. Osteopenia. New L1 superior endplate compression fracture. IMPRESSION: 1. 7 mm anterior segment left upper lobe nodule, unchanged from 09/18/2024 and too small for PET resolution on 10/01/2024. This could be followed with CT chest without contrast in 3 months, as clinically indicated, in this patient with a history of tongue cancer. 2. Small bilateral pleural effusions. 3. New L1 superior endplate compression fracture.  4. Aortic atherosclerosis (ICD10-I70.0). Coronary artery calcification. 5. Enlarged pulmonic trunk, indicative of pulmonary arterial hypertension. 6. Emphysema (ICD10-J43.9). Low-dose CT lung cancer screening is recommended for patients who are 47-23 years of age with a 20+ pack-year history of smoking and who are currently smoking or quit <=15 years ago. Electronically Signed   By: Newell Eke M.D.   On: 10/11/2024 11:40   VAS US  LOWER EXTREMITY ARTERIAL DUPLEX Result Date: 10/07/2024 LOWER EXTREMITY ARTERIAL DUPLEX STUDY Patient Name:  BERNETTE SEEMAN  Date of Exam:   10/07/2024 Medical Rec #: [id number]      Accession #:    [id number] Date of Birth: [birth year]      Patient Gender: F Patient Age:   75 years Exam Location:  [address] Procedure:      VAS US  LOWER EXTREMITY ARTERIAL DUPLEX Referring Phys: GAILE NEW --------------------------------------------------------------------------------  Indications: Peripheral artery disease. High Risk Factors: Hypertension, hyperlipidemia, past history of smoking.  Current ABI: 0.66/0.8 Comparison Study: None. Performing Technologist: Garnette Rockers  Examination Guidelines: A complete evaluation includes B-mode imaging, spectral Doppler, color Doppler, and power Doppler as needed of all accessible portions of each vessel. Bilateral testing is considered an integral part of a complete examination. Limited examinations for reoccurring indications may be performed as noted.   +-----------+--------+-----+---------------+--------+--------+ LEFT       PSV cm/sRatioStenosis       WaveformComments +-----------+--------+-----+---------------+--------+--------+ CFA Prox   168                         biphasic         +-----------+--------+-----+---------------+--------+--------+ CFA Mid    218  biphasic         +-----------+--------+-----+---------------+--------+--------+ CFA Distal 206                         biphasic          +-----------+--------+-----+---------------+--------+--------+ DFA        116                         biphasic         +-----------+--------+-----+---------------+--------+--------+ SFA Prox   279          50-74% stenosisbiphasic         +-----------+--------+-----+---------------+--------+--------+ POP Distal 186          30-49% stenosisbiphasic         +-----------+--------+-----+---------------+--------+--------+ ATA Distal 77                          biphasic         +-----------+--------+-----+---------------+--------+--------+ PTA Distal 48                          biphasic         +-----------+--------+-----+---------------+--------+--------+ PERO Distal85                          biphasic         +-----------+--------+-----+---------------+--------+--------+ A focal velocity elevation of 279 cm/s was obtained at SFA PRX with a VR of 2.1. A 2nd focal velocity elevation was visualized, measuring 185 cm/s at PopA DST with a VR of 1.81.  Left Stent(s): +----------------+--------+--------+---------+--------+ SFA MID-PopA MIDPSV cm/sStenosisWaveform Comments +----------------+--------+--------+---------+--------+ Prox to Stent   122             triphasic         +----------------+--------+--------+---------+--------+ Proximal Stent  90              biphasic          +----------------+--------+--------+---------+--------+ Mid Stent       72              biphasic          +----------------+--------+--------+---------+--------+ Distal Stent    61              biphasic          +----------------+--------+--------+---------+--------+ Distal to Stent 87              biphasic          +----------------+--------+--------+---------+--------+    Summary: Right: 50-74% stenosis noted in the superficial femoral artery. 30-49% stenosis noted in the popliteal artery. Patent stent with no evidence of stenosis in the superficial femoral artery and  popliteal artery artery.  See table(s) above for measurements and observations. Electronically signed by Debby Robertson on 10/07/2024 at 4:28:20 PM.    Final    VAS US  ABI WITH/WO TBI Result Date: 10/07/2024  LOWER EXTREMITY DOPPLER STUDY Patient Name:  JAHNA LIEBERT  Date of Exam:   10/07/2024 Medical Rec #: [id number]      Accession #:    [id number] Date of Birth: 12/12/1945      Patient Gender: F Patient Age:   32 years Exam Location:  [address] Procedure:      VAS US  ABI WITH/WO TBI Referring Phys: GAILE NEW --------------------------------------------------------------------------------  Indications: Claudication, and peripheral artery disease. High Risk Factors: Hypertension, hyperlipidemia, past history  of smoking.  Comparison Study: 07/07/24 Performing Technologist: Garnette Rockers  Examination Guidelines: A complete evaluation includes at minimum, Doppler waveform signals and systolic blood pressure reading at the level of bilateral brachial, anterior tibial, and posterior tibial arteries, when vessel segments are accessible. Bilateral testing is considered an integral part of a complete examination. Photoelectric Plethysmograph (PPG) waveforms and toe systolic pressure readings are included as required and additional duplex testing as needed. Limited examinations for reoccurring indications may be performed as noted.  ABI Findings: +---------+------------------+-----+----------+--------+ Right    Rt Pressure (mmHg)IndexWaveform  Comment  +---------+------------------+-----+----------+--------+ Brachial 152                                       +---------+------------------+-----+----------+--------+ PTA      100               0.66 monophasic         +---------+------------------+-----+----------+--------+ DP       86                0.57 monophasic         +---------+------------------+-----+----------+--------+ Great Toe47                0.31 Abnormal            +---------+------------------+-----+----------+--------+ +---------+------------------+-----+--------+-------+ Left     Lt Pressure (mmHg)IndexWaveformComment +---------+------------------+-----+--------+-------+ Brachial 140                                    +---------+------------------+-----+--------+-------+ PTA      121               0.80 biphasic        +---------+------------------+-----+--------+-------+ DP       116               0.76 biphasic        +---------+------------------+-----+--------+-------+ Great Toe103               0.68 Normal          +---------+------------------+-----+--------+-------+ +-------+-----------+-----------+------------+------------+ ABI/TBIToday's ABIToday's TBIPrevious ABIPrevious TBI +-------+-----------+-----------+------------+------------+ Right  0.66       0.31       0.7         0.23         +-------+-----------+-----------+------------+------------+ Left   0.8        0.68       0.53        absent       +-------+-----------+-----------+------------+------------+  Left ABIs and TBIs appear increased compared to prior study on 07/07/24.  Summary: Right: Resting right ankle-brachial index indicates moderate right lower extremity arterial disease. The right toe-brachial index is abnormal.  Left: Resting left ankle-brachial index indicates mild left lower extremity arterial disease. The left toe-brachial index is abnormal. Left toe pressure is >60 mmHg which suggests adequate perfusion for healing. *See table(s) above for measurements and observations.  Electronically signed by Debby Robertson on 10/07/2024 at 4:27:10 PM.    Final     Labs:  CBC: Recent Labs    09/08/24 0850 10/14/24 1105 11/01/24 0903 11/01/24 1310  WBC 8.5 7.3 6.3 6.6  HGB 9.3* 9.7* 9.7* 9.7*  HCT 29.2* 29.9* 29.6* 30.2*  PLT 297 270 243 233    COAGS: Recent Labs    07/07/24 0218 11/01/24 1310  INR 0.9 0.9  BMP: Recent Labs     09/08/24 0850 10/14/24 1105 11/01/24 0903 11/01/24 1310  NA 139 136 139 138  K 3.8 3.3* 4.7 4.8  CL 103 100 103 102  CO2 25 25 25 26   GLUCOSE 100* 94 106* 96  BUN 14 19 15 15   CALCIUM  8.6* 8.0* 9.3 9.3  CREATININE 1.52* 1.67* 1.39* 1.35*  GFRNONAA 35* 31* 39* 40*    LIVER FUNCTION TESTS: Recent Labs    07/06/24 0939 11/01/24 0903  BILITOT 0.5 0.6  AST 17 25  ALT 13 19  ALKPHOS 53 97  PROT 6.1* 7.3  ALBUMIN 3.1* 4.1    TUMOR MARKERS: No results for input(s): AFPTM, CEA, CA199, CHROMGRNA in the last 8760 hours.  Assessment and Plan: Malignant neoplasm of anterior two-thirds of tongue-  Request for image guided placement of port a catheter and gastrostomy tube. No contraindications for procedure identified in ROS, physical exam, or review of pre-sedation considerations.  Labs reviewed and within acceptable range Imaging available and reviewed VSS, afebrile Abx indicated, Ancef  ordered   Risks and benefits of image guided port-a-catheter placement was discussed with the patient including, but not limited to bleeding, infection, pneumothorax, or fibrin sheath development and need for additional procedures.  Risks and benefits image guided gastrostomy tube placement was discussed with the patient including, but not limited to the need for a barium enema during the procedure, bleeding, infection, peritonitis and/or damage to adjacent structures.  All of the patient's questions were answered, patient is agreeable to proceed.  Consent signed and in chart.   Thank you for allowing our service to participate in BAILA ROUSE 's care.    Electronically Signed: Rachelanne Whidby B Jarion Hawthorne, NP   11/01/2024, 2:10 PM     I spent a total of 30 Minutes in face to face in clinical consultation, greater than 50% of which was counseling/coordinating care for image guided placement of port a catheter and gastrostomy tube.  (A copy of this note was sent to the referring  provider and the time of visit.)  "

## 2024-10-29 NOTE — Assessment & Plan Note (Addendum)
 Plan for PEG tube placement this Friday Management as per ENT and oncology

## 2024-11-01 ENCOUNTER — Inpatient Hospital Stay

## 2024-11-01 ENCOUNTER — Inpatient Hospital Stay: Admitting: Oncology

## 2024-11-01 ENCOUNTER — Other Ambulatory Visit: Payer: Self-pay

## 2024-11-01 ENCOUNTER — Other Ambulatory Visit: Payer: Self-pay | Admitting: Family Medicine

## 2024-11-01 ENCOUNTER — Ambulatory Visit (HOSPITAL_COMMUNITY)
Admission: RE | Admit: 2024-11-01 | Discharge: 2024-11-01 | Disposition: A | Discharge status: Home / Self Care | Source: Ambulatory Visit | Attending: Oncology | Admitting: Oncology

## 2024-11-01 ENCOUNTER — Encounter: Payer: Self-pay | Admitting: Oncology

## 2024-11-01 VITALS — BP 157/81 | HR 72 | Temp 97.6°F | Resp 17 | Ht 60.0 in | Wt 133.7 lb

## 2024-11-01 DIAGNOSIS — Z87891 Personal history of nicotine dependence: Secondary | ICD-10-CM | POA: Insufficient documentation

## 2024-11-01 DIAGNOSIS — C023 Malignant neoplasm of anterior two-thirds of tongue, part unspecified: Secondary | ICD-10-CM

## 2024-11-01 DIAGNOSIS — Z01812 Encounter for preprocedural laboratory examination: Secondary | ICD-10-CM

## 2024-11-01 DIAGNOSIS — Z7901 Long term (current) use of anticoagulants: Secondary | ICD-10-CM | POA: Insufficient documentation

## 2024-11-01 DIAGNOSIS — C029 Malignant neoplasm of tongue, unspecified: Secondary | ICD-10-CM

## 2024-11-01 DIAGNOSIS — R911 Solitary pulmonary nodule: Secondary | ICD-10-CM | POA: Insufficient documentation

## 2024-11-01 HISTORY — PX: IR GASTROSTOMY TUBE MOD SED: IMG625

## 2024-11-01 HISTORY — PX: IR IMAGING GUIDED PORT INSERTION: IMG5740

## 2024-11-01 LAB — CBC WITH DIFFERENTIAL (CANCER CENTER ONLY)
Abs Immature Granulocytes: 0.02 K/uL (ref 0.00–0.07)
Basophils Absolute: 0.1 K/uL (ref 0.0–0.1)
Basophils Relative: 1 %
Eosinophils Absolute: 0.3 K/uL (ref 0.0–0.5)
Eosinophils Relative: 5 %
HCT: 29.6 % — ABNORMAL LOW (ref 36.0–46.0)
Hemoglobin: 9.7 g/dL — ABNORMAL LOW (ref 12.0–15.0)
Immature Granulocytes: 0 %
Lymphocytes Relative: 18 %
Lymphs Abs: 1.1 K/uL (ref 0.7–4.0)
MCH: 30.8 pg (ref 26.0–34.0)
MCHC: 32.8 g/dL (ref 30.0–36.0)
MCV: 94 fL (ref 80.0–100.0)
Monocytes Absolute: 0.6 K/uL (ref 0.1–1.0)
Monocytes Relative: 10 %
Neutro Abs: 4.2 K/uL (ref 1.7–7.7)
Neutrophils Relative %: 66 %
Platelet Count: 243 K/uL (ref 150–400)
RBC: 3.15 MIL/uL — ABNORMAL LOW (ref 3.87–5.11)
RDW: 16.9 % — ABNORMAL HIGH (ref 11.5–15.5)
WBC Count: 6.3 K/uL (ref 4.0–10.5)
nRBC: 0 % (ref 0.0–0.2)

## 2024-11-01 LAB — PROTIME-INR
INR: 0.9 (ref 0.8–1.2)
Prothrombin Time: 13.2 s (ref 11.4–15.2)

## 2024-11-01 LAB — CBC
HCT: 30.2 % — ABNORMAL LOW (ref 36.0–46.0)
Hemoglobin: 9.7 g/dL — ABNORMAL LOW (ref 12.0–15.0)
MCH: 30.8 pg (ref 26.0–34.0)
MCHC: 32.1 g/dL (ref 30.0–36.0)
MCV: 95.9 fL (ref 80.0–100.0)
Platelets: 233 K/uL (ref 150–400)
RBC: 3.15 MIL/uL — ABNORMAL LOW (ref 3.87–5.11)
RDW: 17.2 % — ABNORMAL HIGH (ref 11.5–15.5)
WBC: 6.6 K/uL (ref 4.0–10.5)
nRBC: 0 % (ref 0.0–0.2)

## 2024-11-01 LAB — CMP (CANCER CENTER ONLY)
ALT: 19 U/L (ref 0–44)
AST: 25 U/L (ref 15–41)
Albumin: 4.1 g/dL (ref 3.5–5.0)
Alkaline Phosphatase: 97 U/L (ref 38–126)
Anion gap: 11 (ref 5–15)
BUN: 15 mg/dL (ref 8–23)
CO2: 25 mmol/L (ref 22–32)
Calcium: 9.3 mg/dL (ref 8.9–10.3)
Chloride: 103 mmol/L (ref 98–111)
Creatinine: 1.39 mg/dL — ABNORMAL HIGH (ref 0.44–1.00)
GFR, Estimated: 39 mL/min — ABNORMAL LOW
Glucose, Bld: 106 mg/dL — ABNORMAL HIGH (ref 70–99)
Potassium: 4.7 mmol/L (ref 3.5–5.1)
Sodium: 139 mmol/L (ref 135–145)
Total Bilirubin: 0.6 mg/dL (ref 0.0–1.2)
Total Protein: 7.3 g/dL (ref 6.5–8.1)

## 2024-11-01 LAB — BASIC METABOLIC PANEL WITH GFR
Anion gap: 10 (ref 5–15)
BUN: 15 mg/dL (ref 8–23)
CO2: 26 mmol/L (ref 22–32)
Calcium: 9.3 mg/dL (ref 8.9–10.3)
Chloride: 102 mmol/L (ref 98–111)
Creatinine, Ser: 1.35 mg/dL — ABNORMAL HIGH (ref 0.44–1.00)
GFR, Estimated: 40 mL/min — ABNORMAL LOW
Glucose, Bld: 96 mg/dL (ref 70–99)
Potassium: 4.8 mmol/L (ref 3.5–5.1)
Sodium: 138 mmol/L (ref 135–145)

## 2024-11-01 MED ORDER — MIDAZOLAM HCL 2 MG/2ML IJ SOLN
INTRAMUSCULAR | Status: AC
  Filled 2024-11-01: qty 4

## 2024-11-01 MED ORDER — LIDOCAINE-EPINEPHRINE 1 %-1:100000 IJ SOLN
20.0000 mL | Freq: Once | INTRAMUSCULAR | Status: AC
  Administered 2024-11-01: 20 mL

## 2024-11-01 MED ORDER — HEPARIN SOD (PORK) LOCK FLUSH 100 UNIT/ML IV SOLN
500.0000 [IU] | Freq: Once | INTRAVENOUS | Status: AC
  Administered 2024-11-01: 500 [IU] via INTRAVENOUS

## 2024-11-01 MED ORDER — MIDAZOLAM HCL (PF) 2 MG/2ML IJ SOLN
INTRAMUSCULAR | Status: AC | PRN
  Administered 2024-11-01: 1 mg via INTRAVENOUS
  Administered 2024-11-01 (×2): .5 mg via INTRAVENOUS
  Administered 2024-11-01 (×2): 1 mg via INTRAVENOUS

## 2024-11-01 MED ORDER — SODIUM CHLORIDE 0.9 % IV SOLN: INTRAVENOUS | Status: DC

## 2024-11-01 MED ORDER — GLUCAGON HCL RDNA (DIAGNOSTIC) 1 MG IJ SOLR
INTRAMUSCULAR | Status: AC
  Filled 2024-11-01: qty 1

## 2024-11-01 MED ORDER — GLUCAGON HCL RDNA (DIAGNOSTIC) 1 MG IJ SOLR
INTRAMUSCULAR | Status: AC | PRN
  Administered 2024-11-01: .5 mg via INTRAVENOUS

## 2024-11-01 MED ORDER — CEFAZOLIN SODIUM-DEXTROSE 2-4 GM/100ML-% IV SOLN
INTRAVENOUS | Status: AC | PRN
  Administered 2024-11-01: 2 g via INTRAVENOUS

## 2024-11-01 MED ORDER — CEFAZOLIN SODIUM-DEXTROSE 2-4 GM/100ML-% IV SOLN
INTRAVENOUS | Status: AC
  Filled 2024-11-01: qty 100

## 2024-11-01 MED ORDER — IOHEXOL 300 MG/ML  SOLN
50.0000 mL | Freq: Once | INTRAMUSCULAR | Status: AC | PRN
  Administered 2024-11-01: 20 mL

## 2024-11-01 MED ORDER — IOHEXOL 300 MG/ML  SOLN: 50.0000 mL | Freq: Once | INTRAMUSCULAR | Status: DC | PRN

## 2024-11-01 MED ORDER — LIDOCAINE HCL 1 % IJ SOLN
20.0000 mL | Freq: Once | INTRAMUSCULAR | Status: AC
  Administered 2024-11-01: 20 mL

## 2024-11-01 MED ORDER — FENTANYL CITRATE (PF) 100 MCG/2ML IJ SOLN
INTRAMUSCULAR | Status: AC
  Filled 2024-11-01: qty 4

## 2024-11-01 MED ORDER — FENTANYL CITRATE (PF) 100 MCG/2ML IJ SOLN
INTRAMUSCULAR | Status: AC | PRN
  Administered 2024-11-01 (×2): 25 ug via INTRAVENOUS
  Administered 2024-11-01 (×3): 50 ug via INTRAVENOUS

## 2024-11-01 MED ORDER — CEFAZOLIN SODIUM-DEXTROSE 2-4 GM/100ML-% IV SOLN: 2.0000 g | Freq: Once | INTRAVENOUS | Status: AC

## 2024-11-01 NOTE — Telephone Encounter (Signed)
 Copied from CRM [id number]. Topic: Clinical - Medication Refill >> Nov 01, 2024  8:00 AM Franky GRADE wrote: Medication: HYDROcodone -acetaminophen  (NORCO/VICODIN) 5-325 MG tablet [[id number]]  Has the patient contacted their pharmacy? No (Agent: If no, request that the patient contact the pharmacy for the refill. If patient does not wish to contact the pharmacy document the reason why and proceed with request.) (Agent: If yes, when and what did the pharmacy advise?)  This is the patient's preferred pharmacy:  [facility] [phone] [facility] [place] [place] - [address] [place] [address] Phone: [phone] Fax: [phone]    Is this the correct pharmacy for this prescription? Yes If no, delete pharmacy and type the correct one.   Has the prescription been filled recently? Yes  Is the patient out of the medication? No  Has the patient been seen for an appointment in the last year OR does the patient have an upcoming appointment? Yes  Can we respond through MyChart? Yes  Agent: Please be advised that Rx refills may take up to 3 business days. We ask that you follow-up with your pharmacy.

## 2024-11-01 NOTE — Progress Notes (Signed)
 Discharge instructions reviewed with patient, and daughter.  Patient verbalized understanding of all discharge instructions, copy given. Clear liquid given, pt. Tolerated well with no emesis. Discharged to home with daughter.

## 2024-11-01 NOTE — Progress Notes (Signed)
 Patient received from radiology, port in place (right upper chest) with transparent dressing and 2x2 gauze in place.  Left upper quad-GTube placed with 16Fr 3-5 ml, current white gauze over site - tube extended beyond dressing.  No active bleeding on dressings.

## 2024-11-01 NOTE — Assessment & Plan Note (Addendum)
 Please review oncology history for additional details and timeline of events.  Post-surgical status following tongue cancer with cervical lymph node involvement and extracapsular extension.   Neck dissection on October 15th with removal of 33 lymph nodes, 3 with cancer. High risk of recurrence due to extracapsular extension.   CT chest on 09/18/2024 showed mild mediastinal lymphadenopathy and metastatic involvement could not be excluded.  8 mm anterior left upper lobe lung nodule noted as well.  On her consultation with us  on 09/20/2024, request placed for staging PET-CT scan for further evaluation of chest lymphadenopathy.   On 10/01/2024, PET scan showed hypermetabolic left cervical lymph node, indicative of residual/recurrent disease. Hypermetabolic 4 mm parotid nodule, worrisome for malignancy. No abnormal hypermetabolism associated with the left posterior tongue, as questioned on CT neck 09/18/2024. Small right pleural effusion. Aortic atherosclerosis (ICD10-I70.0). Coronary artery calcification. Enlarged pulmonic trunk, indicative of pulmonary arterial hypertension.  She had a follow-up CT super D of chest on 10/11/2024 which showed  7 mm anterior segment left upper lobe nodule, unchanged from 09/18/2024 and too small for PET resolution on 10/01/2024. This could be followed with CT chest without contrast in 3 months, as clinically indicated, in this patient with a history of tongue cancer. Small bilateral pleural effusions.  On 10/14/2024, she underwent bronchoscopy and biopsy of the left upper lobe lung nodule and lymph nodes and pathology showed reactive cells without evidence of malignancy.  pT2, pN3B, M0 disease. Stage IVb disease.   Plan made to proceed with concurrent chemoradiation.   Given her CKD, she is not a candidate for cisplatin.  Hence plan is to proceed with weekly carboplatin/paclitaxel.  Carboplatin dose reduced to AUC 1.5, considering her age and comorbidities.  Labs  today reveal no dose-limiting toxicities.  Will proceed with cycle 1 of carboplatin and paclitaxel as scheduled on 11/04/2024.  She is scheduled to start radiation treatments at the same time.  She is scheduled undergo Port-A-Cath placement and feeding tube placement later today.  She has completed chemo education.  Will continue treatments weekly during the course of radiation.

## 2024-11-01 NOTE — Progress Notes (Signed)
 "  [facility]  ONCOLOGY CLINIC PROGRESS NOTE   Patient Care Team: Taylor Mirza, MD as PCP - General Jordan, Peter M, MD as PCP - Cardiology (Cardiology) Taylor Oliphant, MD as Consulting Physician (Otolaryngology) Taylor Domino, MD as Attending Physician (Radiation Oncology) Malmfelt, Delon CROME, RN as Oncology Nurse Navigator Taylor Millman, MD as Consulting Physician (Oncology) Taylor Lamar RAMAN, MD as Consulting Physician (Pulmonary Disease)  PATIENT NAME: Taylor Bush   MR#: [id number] DOB: 05/29/1946  Date of visit: 11/01/2024   ASSESSMENT & PLAN:   Taylor Bush is a 78 y.o. pleasant lady with a past medical history of CKD stage III, hypertension, dyslipidemia, arthritis, peripheral vascular disease, past smoking, was referred to our clinic for recently diagnosed squamous of carcinoma of the tongue status post left partial glossectomy on 06/17/2024 followed by neck dissection on 08/21/2024.  pT2, pN3B, M0 disease. Stage IVb disease.   Malignant neoplasm of anterior two-thirds of tongue [facility]) Please review oncology history for additional details and timeline of events.  Post-surgical status following tongue cancer with cervical lymph node involvement and extracapsular extension.   Neck dissection on October 15th with removal of 33 lymph nodes, 3 with cancer. High risk of recurrence due to extracapsular extension.   CT chest on 09/18/2024 showed mild mediastinal lymphadenopathy and metastatic involvement could not be excluded.  8 mm anterior left upper lobe lung nodule noted as well.  On her consultation with us  on 09/20/2024, request placed for staging PET-CT scan for further evaluation of chest lymphadenopathy.   On 10/01/2024, PET scan showed hypermetabolic left cervical lymph node, indicative of residual/recurrent disease. Hypermetabolic 4 mm parotid nodule, worrisome for malignancy. No abnormal hypermetabolism associated with the left posterior tongue, as  questioned on CT neck 09/18/2024. Small right pleural effusion. Aortic atherosclerosis (ICD10-I70.0). Coronary artery calcification. Enlarged pulmonic trunk, indicative of pulmonary arterial hypertension.  She had a follow-up CT super D of chest on 10/11/2024 which showed  7 mm anterior segment left upper lobe nodule, unchanged from 09/18/2024 and too small for PET resolution on 10/01/2024. This could be followed with CT chest without contrast in 3 months, as clinically indicated, in this patient with a history of tongue cancer. Small bilateral pleural effusions.  On 10/14/2024, she underwent bronchoscopy and biopsy of the left upper lobe lung nodule and lymph nodes and pathology showed reactive cells without evidence of malignancy.  pT2, pN3B, M0 disease. Stage IVb disease.   Plan made to proceed with concurrent chemoradiation.   Given her CKD, she is not a candidate for cisplatin.  Hence plan is to proceed with weekly carboplatin/paclitaxel.  Carboplatin dose reduced to AUC 1.5, considering her age and comorbidities.  Labs today reveal no dose-limiting toxicities.  Will proceed with cycle 1 of carboplatin and paclitaxel as scheduled on 11/04/2024.  She is scheduled to start radiation treatments at the same time.  She is scheduled undergo Port-A-Cath placement and feeding tube placement later today.  She has completed chemo education.  Will continue treatments weekly during the course of radiation.    Chronic kidney disease She has mild chronic kidney disease with fluctuating creatinine. Chemotherapy regimen and dosing were selected to minimize nephrotoxicity, with ongoing monitoring and dose adjustments as needed. - Monitored renal function with laboratory testing. - Selected carboplatin for chemotherapy and dose-reduced based on renal function. - Planned ongoing adjustment of chemotherapy dosing according to renal function.  Anemia in neoplastic disease She has chronic anemia related to  neoplastic disease, with hemoglobin  stable at 9.7 g/dL and no acute changes. - Monitored hemoglobin levels with routine blood work prior to chemotherapy.  I reviewed lab results and outside records for this visit and discussed relevant results with the patient. Diagnosis, plan of care and treatment options were also discussed in detail with the patient. Opportunity provided to ask questions and answers provided to her apparent satisfaction. Provided instructions to call our clinic with any problems, questions or concerns prior to return visit. I recommended to continue follow-up with PCP and sub-specialists. She verbalized understanding and agreed with the plan.   NCCN guidelines have been consulted in the planning of this patients care.  I spent a total of 43 minutes during this encounter with the patient including review of chart and various tests results, discussions about plan of care and coordination of care plan.   Chinita Patten, MD  11/01/2024 3:15 PM  [facility] - A DEPT OF [facility] - [facility] [address] [place] [address] Dept: [phone] Dept Fax: [phone]    CHIEF COMPLAINT/ REASON FOR VISIT:   Squamous of carcinoma of the tongue status post left partial glossectomy on 06/17/2024 followed by neck dissection on 08/21/2024.  pT2, pN3B, M0 disease. Stage IVb disease.   Current Treatment: Adjuvant concurrent chemoradiation with weekly carboplatin /paclitaxel , starting from 11/04/2024  INTERVAL HISTORY:    Discussed the use of AI scribe software for clinical note transcription with the patient, who gave verbal consent to proceed.  History of Present Illness Taylor Bush is a 78 year old female with malignant neoplasm of the anterior two-thirds of the tongue presenting for oncology follow-up prior to initiation of concurrent chemoradiation.  Recent biopsies were negative for nodal involvement and PET imaging  confirmed no significant lymphadenopathy. She has received education regarding her treatment plan, including the rationale for concurrent chemoradiation and the need for weekly blood work to monitor for cytopenias. Chemotherapy dosing will be adjusted based on her stable mild chronic kidney disease (creatinine 1.39).  Her blood counts are stable, with hemoglobin approximately 9.7 g/dL, normal white blood cell and platelet counts, and no abnormal bleeding or bruising. She denies fever, chills, night sweats, or new symptoms. She is eating adequately and denies dysphagia or odynophagia.  She has received prescriptions for antiemetics for nausea as needed. She expressed concern regarding prior generalized aching with prednisone, which was clarified as a side effect from high-dose taper rather than a true allergy. Dexamethasone  was discontinued due to intolerance.  She has a healed leg wound, currently covered with a bandaid, and is not receiving wound care or antibiotics. No other acute medical issues were identified.   I have reviewed the past medical history, past surgical history, social history and family history with the patient and they are unchanged from previous note.  HISTORY OF PRESENT ILLNESS:   ONCOLOGY HISTORY:   She has a history of recurrent oral leukoplakia followed by Dr. Jesus since at least 2016. She also had a medical history notable for stage 3 CKD, chronic a-fib, and PAD.   Upon record review, she presented to Dr. Jesus on 08/14/23 for evaluation of new white spots on the posterior left lateral aspect of the tongue. Recurrent oral leukoplakia was suspected and laser ablation and biopsies under anesthesia were recommended (which she had undergone in the past in the same setting). Biopsies of the left tongue were promptly obtained on 10/02/23 and showed moderate squamous dysplasia without evidence of carcinoma.  She remained without issues to her tongue in the following months  but later presented to Dr. Jesus on 06/13/24 with c/o worsening irritation to the left side of her tongue over the past several months. Oral exam performed at that time noted a 3 cm raised ulcerative lesion with surrounding leukoplakia to the left posterior lateral tongue.    Based on Dr. Godfrey recommendations, she opted to proceed with a left partial glossectomy on 06/17/24. Pathology from the procedure revealed: tumor the size of 1.9 cm; histology of invasive well to moderately differentiated squamous cell carcinoma invading a depth of 0.9 cm, along with adjacent high-grade squamous dysplasia; p16 negative; angiolymphatic invasion present; all margins negative for dysplasia or carcinoma. No lymph nodes were excised.    Based on the depth of invasion, she underwent left modified neck dissection on 08/21/24. Pathology from the procedure revealed: 03/33 lymph nodes positive for metastatic SCC; the largest lymph node involved was a level II lymph node measuring 1.9 cm. The same large level II lymph node was also positive for focal extracapsular extension; with a distance of less than 2 mm from the lymph node capsule (microscopic extranodal extension).    Her post-op course was uncomplicated per her subsequent follow-up examinations by Dr. Jesus. Radiation therapy has been recommended and Dr. Jesus also referred her to medical oncology.    Pertinent imaging performed thus far includes:  -- Soft tissue neck CT with contrast on 09/18/24 which demonstrated: a prominent heterogeneous left level 3/4 cervical lymph node measuring 1.0 x 1.2 cm with central hypoattenuation concerning for possible central necrosis; subtle asymmetric soft tissue at the left posterior tongue (likely related to recent surgical changes); and postsurgical changes of the left neck with presumed surgical absence of the left submandibular gland.  -- CT of the abdomen on 09/18/24 showed no evidence of metastatic disease in the abdomen or  acute findings overall.  --CT Chest 09/18/24:  Lower cervical lymphadenopathy characterized on CT neck performed at the same time and reported separately. Mild mediastinal lymphadenopathy. Metastatic involvement not excluded. PET-CT suggested to further evaluate. 8 mm anterior left upper lobe pulmonary nodule shows mixed attenuation with sub solid features. This nodule warrants close follow-up as metastatic disease or lung primary not excluded. PET-CT may prove helpful.  Small right pleural effusion with dependent atelectasis right lower lobe.   On her consultation with us  on 09/20/2024, request placed for staging PET-CT scan for further evaluation of chest lymphadenopathy.   On 10/01/2024, PET scan showed hypermetabolic left cervical lymph node, indicative of residual/recurrent disease. Hypermetabolic 4 mm parotid nodule, worrisome for malignancy. No abnormal hypermetabolism associated with the left posterior tongue, as questioned on CT neck 09/18/2024. Small right pleural effusion. Aortic atherosclerosis (ICD10-I70.0). Coronary artery calcification. Enlarged pulmonic trunk, indicative of pulmonary arterial hypertension.  She had a follow-up CT super D of chest on 10/11/2024 which showed  7 mm anterior segment left upper lobe nodule, unchanged from 09/18/2024 and too small for PET resolution on 10/01/2024. This could be followed with CT chest without contrast in 3 months, as clinically indicated, in this patient with a history of tongue cancer. Small bilateral pleural effusions.  On 10/14/2024, she underwent bronchoscopy and biopsy of the left upper lobe lung nodule and lymph nodes and pathology showed reactive cells without evidence of malignancy.  pT2, pN3B, M0 disease. Stage IVb disease.   Plan made to proceed with concurrent chemoradiation.   Given her CKD, she is not a candidate for cisplatin.  Hence plan  is to proceed with weekly carboplatin/paclitaxel.  Carboplatin dose reduced to AUC 1.5,  considering her age and comorbidities.  She is scheduled to start concurrent chemoradiation from 11/04/2024.   Oncology History  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)  09/20/2024 Initial Diagnosis   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)   09/20/2024 Cancer Staging   Staging form: Oral Cavity, AJCC 8th Edition - Pathologic stage from 09/20/2024: Stage IVB (pT2, pN3b, cM0) - Signed by Taylor Millman, MD on 10/18/2024 Stage prefix: Initial diagnosis   11/04/2024 -  Chemotherapy   Patient is on Treatment Plan : HEAD/NECK Carboplatin + Paclitaxel + XRT q7d         REVIEW OF SYSTEMS:   Review of Systems - Oncology  All other pertinent systems were reviewed with the patient and are negative.  ALLERGIES: She is allergic to statins.  MEDICATIONS:  Current Outpatient Medications  Medication Sig Dispense Refill   amLODipine  (NORVASC ) 10 MG tablet TAKE 1 TABLET BY MOUTH DAILY 100 tablet 0   Faricimab -svoa (VABYSMO  IZ) 1 Dose by Intravitreal route every 6 (six) weeks.     HYDROcodone -acetaminophen  (NORCO/VICODIN) 5-325 MG tablet Take 1 tablet by mouth every 6 (six) hours as needed for moderate pain (pain score 4-6). 30 tablet 0   lisinopril  (ZESTRIL ) 20 MG tablet TAKE 1 TABLET BY MOUTH DAILY 100 tablet 0   metoprolol  (TOPROL -XL) 200 MG 24 hr tablet TAKE 1 TABLET BY MOUTH DAILY 100 tablet 0   prochlorperazine  (COMPAZINE ) 10 MG tablet Take 1 tablet (10 mg total) by mouth every 6 (six) hours as needed for nausea or vomiting. 30 tablet 1   sodium fluoride (FLUORISHIELD) 1.1 % GEL dental gel Apply thin layer of fluoride gel inside the trays and wear them for 20 minutes nightly. Start after completion of HNRT     traMADol  (ULTRAM ) 50 MG tablet Take 1 tablet (50 mg total) by mouth every 8 (eight) hours as needed. 40 tablet 2   apixaban  (ELIQUIS ) 5 MG TABS tablet Take 1 tablet (5 mg total) by mouth 2 (two) times daily. 60 tablet 3   atorvastatin  (LIPITOR )  80 MG tablet Take 1 tablet (80 mg total) by mouth daily. (Patient not taking: No sig reported) 30 tablet 3   clopidogrel  (PLAVIX ) 75 MG tablet Take 1 tablet (75 mg total) by mouth daily with breakfast. (Patient not taking: No sig reported) 30 tablet 3   lidocaine -prilocaine  (EMLA ) cream Apply to affected area once (Patient not taking: Reported on 10/29/2024) 30 g 3   ondansetron  (ZOFRAN ) 8 MG tablet Take 1 tablet (8 mg total) by mouth every 8 (eight) hours as needed for nausea or vomiting. Start on the third day after chemotherapy. (Patient not taking: Reported on 10/29/2024) 30 tablet 1   ondansetron  (ZOFRAN -ODT) 4 MG disintegrating tablet Take 1 tablet (4 mg total) by mouth every 8 (eight) hours as needed for nausea or vomiting. (Patient not taking: Reported on 10/29/2024) 20 tablet 0   No current facility-administered medications for this visit.   Facility-Administered Medications Ordered in Other Visits  Medication Dose Route Frequency Provider Last Rate Last Admin   0.9 %  sodium chloride  infusion   Intravenous Continuous Bonney, Kristi B, NP       ceFAZolin  (ANCEF ) IVPB 2g/100 mL premix  2 g Intravenous Once Bonney, Kristi B, NP       ceFAZolin  (ANCEF ) IVPB 2g/100 mL premix   Intravenous Continuous PRN Hughes Simmonds, MD   2 g at  11/01/24 1501   fentaNYL  (SUBLIMAZE ) injection   Intravenous PRN Mugweru, Jon, MD   25 mcg at 11/01/24 1512   glucagon  (human recombinant) (GLUCAGEN ) injection    PRN Hughes Simmonds, MD   0.5 mg at 11/01/24 1502   midazolam  PF (VERSED ) injection   Intravenous PRN Mugweru, Jon, MD   0.5 mg at 11/01/24 1512     VITALS:   Blood pressure (!) 157/81, pulse 72, temperature 97.6 F (36.4 C), temperature source Temporal, resp. rate 17, height 5' (1.524 Bush), weight 133 lb 11.2 oz (60.6 kg), SpO2 99%.  Wt Readings from Last 3 Encounters:  11/01/24 133 lb (60.3 kg)  11/01/24 133 lb 11.2 oz (60.6 kg)  10/29/24 135 lb 6.4 oz (61.4 kg)    Body mass index is 26.11  kg/Bush.    Onc Performance Status - 11/01/24 1500       ECOG Perf Status   ECOG Perf Status Restricted in physically strenuous activity but ambulatory and able to carry out work of a light or sedentary nature, e.g., light house work, office work      KPS SCALE   KPS % SCORE Able to carry on normal activity, minor s/s of disease          PHYSICAL EXAM:   Physical Exam Constitutional:      General: She is not in acute distress.    Appearance: Normal appearance.  HENT:     Head: Normocephalic and atraumatic.     Mouth/Throat:     Comments: Well-healed scar along the left tongue bordering the floor of mouth  Cardiovascular:     Rate and Rhythm: Normal rate.  Pulmonary:     Effort: Pulmonary effort is normal. No respiratory distress.  Abdominal:     General: There is no distension.  Lymphadenopathy:     Cervical: Cervical adenopathy (Left-sided neck dissection scar has healed well.  Mild lymphedema noted.) present.  Neurological:     General: No focal deficit present.     Mental Status: She is alert and oriented to person, place, and time.  Psychiatric:        Mood and Affect: Mood normal.        Behavior: Behavior normal.      LABORATORY DATA:   I have reviewed the data as listed.  Results for orders placed or performed during the hospital encounter of 11/01/24  CBC  Result Value Ref Range   WBC 6.6 4.0 - 10.5 K/uL   RBC 3.15 (L) 3.87 - 5.11 MIL/uL   Hemoglobin 9.7 (L) 12.0 - 15.0 g/dL   HCT 69.7 (L) 63.9 - 53.9 %   MCV 95.9 80.0 - 100.0 fL   MCH 30.8 26.0 - 34.0 pg   MCHC 32.1 30.0 - 36.0 g/dL   RDW 82.7 (H) 88.4 - 84.4 %   Platelets 233 150 - 400 K/uL   nRBC 0.0 0.0 - 0.2 %  Protime-INR  Result Value Ref Range   Prothrombin Time 13.2 11.4 - 15.2 seconds   INR 0.9 0.8 - 1.2  Basic metabolic panel  Result Value Ref Range   Sodium 138 135 - 145 mmol/L   Potassium 4.8 3.5 - 5.1 mmol/L   Chloride 102 98 - 111 mmol/L   CO2 26 22 - 32 mmol/L   Glucose, Bld  96 70 - 99 mg/dL   BUN 15 8 - 23 mg/dL   Creatinine, Ser 8.64 (H) 0.44 - 1.00 mg/dL   Calcium  9.3 8.9 - 10.3  mg/dL   GFR, Estimated 40 (L) >60 mL/min   Anion gap 10 5 - 15  Results for orders placed or performed in visit on 11/01/24  CMP (Cancer Center only)  Result Value Ref Range   Sodium 139 135 - 145 mmol/L   Potassium 4.7 3.5 - 5.1 mmol/L   Chloride 103 98 - 111 mmol/L   CO2 25 22 - 32 mmol/L   Glucose, Bld 106 (H) 70 - 99 mg/dL   BUN 15 8 - 23 mg/dL   Creatinine 8.60 (H) 9.55 - 1.00 mg/dL   Calcium  9.3 8.9 - 10.3 mg/dL   Total Protein 7.3 6.5 - 8.1 g/dL   Albumin 4.1 3.5 - 5.0 g/dL   AST 25 15 - 41 U/L   ALT 19 0 - 44 U/L   Alkaline Phosphatase 97 38 - 126 U/L   Total Bilirubin 0.6 0.0 - 1.2 mg/dL   GFR, Estimated 39 (L) >60 mL/min   Anion gap 11 5 - 15  CBC with Differential (Cancer Center Only)  Result Value Ref Range   WBC Count 6.3 4.0 - 10.5 K/uL   RBC 3.15 (L) 3.87 - 5.11 MIL/uL   Hemoglobin 9.7 (L) 12.0 - 15.0 g/dL   HCT 70.3 (L) 63.9 - 53.9 %   MCV 94.0 80.0 - 100.0 fL   MCH 30.8 26.0 - 34.0 pg   MCHC 32.8 30.0 - 36.0 g/dL   RDW 83.0 (H) 88.4 - 84.4 %   Platelet Count 243 150 - 400 K/uL   nRBC 0.0 0.0 - 0.2 %   Neutrophils Relative % 66 %   Neutro Abs 4.2 1.7 - 7.7 K/uL   Lymphocytes Relative 18 %   Lymphs Abs 1.1 0.7 - 4.0 K/uL   Monocytes Relative 10 %   Monocytes Absolute 0.6 0.1 - 1.0 K/uL   Eosinophils Relative 5 %   Eosinophils Absolute 0.3 0.0 - 0.5 K/uL   Basophils Relative 1 %   Basophils Absolute 0.1 0.0 - 0.1 K/uL   Immature Granulocytes 0 %   Abs Immature Granulocytes 0.02 0.00 - 0.07 K/uL      RADIOGRAPHIC STUDIES:  I have personally reviewed the radiological images as listed and agree with the findings in the report.  DG Chest Port 1 View CLINICAL DATA:  Status post bronchoscopy with biopsy  EXAM: PORTABLE CHEST 1 VIEW  COMPARISON:  08/21/2024, PET CT 10/01/2024, chest CT 10/11/2024  FINDINGS: Mild cardiomegaly. Fiducial  marker now evident in the left upper lobe with surrounding vague pulmonary opacity presumably post biopsy change. Negative for pleural effusion. No definitive pneumothorax is seen. Aortic atherosclerosis  IMPRESSION: Fiducial marker in the left upper lobe with surrounding vague pulmonary opacity presumably post biopsy change. No definitive pneumothorax.  Electronically Signed   By: Luke Bun Bush.D.   On: 10/14/2024 16:16 DG C-Arm 1-60 Min-No Report Fluoroscopy was utilized by the requesting physician.  No radiographic  interpretation.  DG C-ARM BRONCHOSCOPY C-ARM BRONCHOSCOPY:  Fluoroscopy was utilized by the requesting physician.  No radiographic  interpretation.    CODE STATUS:  Code Status History     Date Active Date Inactive Code Status Order ID Comments User Context   08/21/2024 1658 08/22/2024 1914 Full Code [id number]  Taylor Oliphant, MD Inpatient   07/06/2024 1129 07/13/2024 2046 Full Code [id number]  Dennise Lavada POUR, MD ED   06/17/2024 1540 06/18/2024 1625 Full Code [id number]  Taylor Oliphant, MD Inpatient   12/21/2011 1452 12/24/2011 1530 Full Code [id number]  Ramonita Consuelo PARAS, RN Inpatient    Questions for Most Recent Historical Code Status (Order [id number])     Question Answer   By: Other            Orders Placed This Encounter  Procedures   CBC with Differential (Cancer Center Only)    Standing Status:   Future    Expected Date:   11/25/2024    Expiration Date:   11/25/2025   CMP (Cancer Center only)    Standing Status:   Future    Expected Date:   11/25/2024    Expiration Date:   11/25/2025   CBC with Differential (Cancer Center Only)    Standing Status:   Future    Expected Date:   12/02/2024    Expiration Date:   12/02/2025   CMP (Cancer Center only)    Standing Status:   Future    Expected Date:   12/02/2024    Expiration Date:   12/02/2025   CBC with Differential (Cancer Center Only)    Standing Status:   Future    Expected Date:   12/09/2024    Expiration  Date:   12/09/2025   CMP (Cancer Center only)    Standing Status:   Future    Expected Date:   12/09/2024    Expiration Date:   12/09/2025   CBC with Differential (Cancer Center Only)    Standing Status:   Future    Expected Date:   12/16/2024    Expiration Date:   12/16/2025   CMP (Cancer Center only)    Standing Status:   Future    Expected Date:   12/16/2024    Expiration Date:   12/16/2025     Future Appointments  Date Time Provider Department Center  11/04/2024 10:30 AM Taylor Domino, MD [facility] None  11/04/2024 10:45 AM LINAC-SQUIRE [facility] None  11/04/2024 11:30 AM [facility] INFUSION [facility] None  11/04/2024 12:00 PM Karlene Few, LCSW [facility] None  11/04/2024  2:30 PM Dasie Simple, RD [facility] None  11/05/2024 10:45 AM [facility] LINAC 4 [facility] None  11/06/2024 11:30 AM [facility] LINAC 4 [facility] None  11/08/2024  2:00 PM [facility] LINAC 4 [facility] None  11/11/2024  7:45 AM [facility] FLUSH [facility] None  11/11/2024  8:00 AM [facility] INFUSION [facility] None  11/11/2024 11:15 AM Dasie Simple, RD [facility] None  11/11/2024 12:15 PM [facility] [id number] [facility] None  11/12/2024 10:45 AM [facility] LINAC 4 [facility] None  11/13/2024 10:45 AM [facility] LINAC 4 [facility] None  11/14/2024 10:45 AM [facility] LINAC 4 [facility] None  11/14/2024  2:00 PM Harden Jerona GAILS, MD [facility] None  11/15/2024  9:00 AM [facility] FLUSH [facility] None  11/15/2024  9:30 AM Raschelle Wisenbaker, MD [facility] None  11/15/2024 10:45 AM [facility] LINAC 4 [facility] None  11/18/2024 10:45 AM [facility] LINAC 4 [facility] None  11/18/2024 12:00 PM [facility] INFUSION [facility] None  11/19/2024 10:45 AM [facility] LINAC 4 [facility] None  11/20/2024 10:45 AM [facility] LINAC 4 [facility] None  11/21/2024 10:45 AM [facility] LINAC 4 [facility] None  11/22/2024  9:15 AM [facility] FLUSH [facility] None  11/22/2024  9:45 AM Kross Swallows, MD [facility] None   11/22/2024 10:45 AM [facility] LINAC 4 [facility] None  11/22/2024 11:15 AM Ivonne Harlene Bush, RD [facility] None  11/25/2024 10:45 AM [facility] LINAC 4 [facility] None  11/25/2024 12:00 PM [facility] INFUSION [facility] None  11/26/2024 10:45 AM [facility] LINAC 4 [facility] None  11/27/2024 10:45 AM [facility] LINAC 4 [facility] None  11/28/2024  10:45 AM [facility] LINAC 4 [facility] None  11/29/2024  9:15 AM [facility] FLUSH [facility] None  11/29/2024  9:45 AM Aria Jarrard, MD [facility] None  11/29/2024 10:45 AM [facility] LINAC 4 [facility] None  11/29/2024 11:15 AM Ivonne Harlene Bush, RD [facility] None  12/02/2024 10:45 AM [facility] LINAC 4 [facility] None  12/02/2024  1:00 PM [facility] 9 [facility] H&V  12/02/2024  2:00 PM [facility] 9 [facility] H&V  12/02/2024  2:30 PM [facility] PA [facility] H&V  12/03/2024 10:45 AM [facility] LINAC 4 [facility] None  12/03/2024 11:45 AM [facility] INFUSION [facility] None  12/04/2024 10:30 AM [facility] LINAC 4 [facility] None  12/05/2024 10:30 AM [facility] LINAC 4 [facility] None  12/06/2024 10:30 AM [facility] LINAC 4 [facility] None  12/06/2024 11:15 AM Ivonne Harlene Bush, RD [facility] None  12/09/2024 10:30 AM [facility] LINAC 4 [facility] None  12/10/2024 10:30 AM [facility] LINAC 4 [facility] None  12/11/2024 10:30 AM [facility] LINAC 4 [facility] None  12/12/2024 10:30 AM [facility] LINAC 4 [facility] None  12/13/2024 10:30 AM [facility] LINAC 4 [facility] None  12/13/2024 11:15 AM Ivonne Harlene Bush, RD [facility] None  12/16/2024 10:30 AM [facility] LINAC 4 [facility] None  12/20/2024 11:15 AM Ivonne Harlene Bush, RD [facility] None  04/02/2025  2:00 PM Pleas, Dipti, MD [facility] [address]  05/02/2025 11:30 AM [facility] ANNUAL WELLNESS VISIT 1 [facility] [address]      This document was completed utilizing engineer, civil (consulting). Grammatical errors, random word insertions, pronoun errors, and incomplete  sentences are an occasional consequence of this system due to software limitations, ambient noise, and hardware issues. Any formal questions or concerns about the content, text or information contained within the body of this dictation should be directly addressed to the provider for clarification.   "

## 2024-11-01 NOTE — Telephone Encounter (Signed)
 Name of Medication: Hydrocodone  Name of Pharmacy: [facility] Last Fill or Written Date and Quantity: 10-17-24 #30 Last Office Visit and Type: 10-17-24 Next Office Visit and Type: No future OV Last Controlled Substance Agreement Date: N/A Last UDS: N/A

## 2024-11-01 NOTE — Procedures (Signed)
 Vascular and Interventional Radiology Procedure Note  Patient: Taylor Bush DOB: 1946/08/19 Medical Record Number: [id number] Note Date/Time: 11/01/2024 3:25 PM   Performing Physician: Thom Hall, MD Assistant(s): None  Diagnosis: Head and Neck cancer  Procedure:  PORT PLACEMENT PERCUTANEOUS GASTROSTOMY TUBE PLACEMENT  Anesthesia: Conscious Sedation Complications: None Estimated Blood Loss: Minimal  Findings:  Successful right-sided port placement, with the tip of the catheter in the proximal right atrium. Successful placement of a 29F gastrostomy tube under fluoroscopy.  Plan: Port catheter ready for use.  G-tube to gravity drainage bag x 24 hrs Liquid diet x 24 hrs OK to cap G-tube (during 24 hr period listed above) for 2 hours post liquid meal. OK for meds per tube immediately post procedure. OK to begin TFs via G-tube use in 4 hrs. Taper from trickle to goal as tolerated.   Pt is to return to [facility] for routine G-tube exchange in 6 months.  See detailed procedure note with images in PACS. The patient tolerated the procedure well without incident or complication and was returned to Recovery in stable condition.    Thom Hall, MD [facility]   Pager. [phone] Clinic. [phone]

## 2024-11-04 ENCOUNTER — Inpatient Hospital Stay

## 2024-11-04 ENCOUNTER — Ambulatory Visit

## 2024-11-04 ENCOUNTER — Other Ambulatory Visit: Payer: Self-pay

## 2024-11-04 ENCOUNTER — Ambulatory Visit
Admission: RE | Admit: 2024-11-04 | Discharge: 2024-11-04 | Disposition: A | Discharge status: Home / Self Care | Source: Ambulatory Visit | Attending: Radiation Oncology | Admitting: Radiation Oncology

## 2024-11-04 VITALS — BP 154/67 | HR 70 | Temp 97.5°F | Resp 16 | Wt 131.0 lb

## 2024-11-04 DIAGNOSIS — C023 Malignant neoplasm of anterior two-thirds of tongue, part unspecified: Secondary | ICD-10-CM

## 2024-11-04 LAB — RAD ONC ARIA SESSION SUMMARY
Course Elapsed Days: 0
Plan Fractions Treated to Date: 1
Plan Prescribed Dose Per Fraction: 2.2 Gy
Plan Total Fractions Prescribed: 30
Plan Total Prescribed Dose: 66 Gy
Reference Point Dosage Given to Date: 2.2 Gy
Reference Point Session Dosage Given: 2.2 Gy
Session Number: 1

## 2024-11-04 MED ORDER — HYDROCODONE-ACETAMINOPHEN 5-325 MG PO TABS: 1.0000 | ORAL_TABLET | Freq: Four times a day (QID) | ORAL | 0 refills | Status: DC | PRN

## 2024-11-04 MED ORDER — SODIUM CHLORIDE 0.9 % IV SOLN: INTRAVENOUS | Status: DC

## 2024-11-04 MED ORDER — DEXAMETHASONE SOD PHOSPHATE PF 10 MG/ML IJ SOLN
10.0000 mg | Freq: Once | INTRAMUSCULAR | Status: AC
  Administered 2024-11-04: 10 mg via INTRAVENOUS

## 2024-11-04 MED ORDER — SODIUM CHLORIDE 0.9 % IV SOLN
45.0000 mg/m2 | Freq: Once | INTRAVENOUS | Status: AC
  Administered 2024-11-04: 72 mg via INTRAVENOUS
  Filled 2024-11-04: qty 12

## 2024-11-04 MED ORDER — FAMOTIDINE IN NACL 20-0.9 MG/50ML-% IV SOLN
20.0000 mg | Freq: Once | INTRAVENOUS | Status: AC
  Administered 2024-11-04: 20 mg via INTRAVENOUS
  Filled 2024-11-04: qty 50

## 2024-11-04 MED ORDER — PALONOSETRON HCL INJECTION 0.25 MG/5ML
0.2500 mg | Freq: Once | INTRAVENOUS | Status: AC
  Administered 2024-11-04: 0.25 mg via INTRAVENOUS
  Filled 2024-11-04: qty 5

## 2024-11-04 MED ORDER — DIPHENHYDRAMINE HCL 50 MG/ML IJ SOLN
25.0000 mg | Freq: Once | INTRAMUSCULAR | Status: AC
  Administered 2024-11-04: 25 mg via INTRAVENOUS
  Filled 2024-11-04: qty 1

## 2024-11-04 MED ORDER — SODIUM CHLORIDE 0.9 % IV SOLN
88.2000 mg | Freq: Once | INTRAVENOUS | Status: AC
  Administered 2024-11-04: 90 mg via INTRAVENOUS
  Filled 2024-11-04: qty 9

## 2024-11-04 NOTE — Patient Instructions (Signed)
 [facility] - A DEPT OF [facility]  Discharge Instructions: Thank you for choosing [facility] to provide your oncology and hematology care.   If you have a lab appointment with the Cancer Center, please go directly to the Cancer Center and check in at the registration area.   Wear comfortable clothing and clothing appropriate for easy access to any Portacath or PICC line.   We strive to give you quality time with your provider. You may need to reschedule your appointment if you arrive late (15 or more minutes).  Arriving late affects you and other patients whose appointments are after yours.  Also, if you miss three or more appointments without notifying the office, you may be dismissed from the clinic at the provider's discretion.      For prescription refill requests, have your pharmacy contact our office and allow 72 hours for refills to be completed.    Today you received the following chemotherapy and/or immunotherapy agents: Paclitaxel , Carboplatin .       To help prevent nausea and vomiting after your treatment, we encourage you to take your nausea medication as directed.  BELOW ARE SYMPTOMS THAT SHOULD BE REPORTED IMMEDIATELY: *FEVER GREATER THAN 100.4 F (38 C) OR HIGHER *CHILLS OR SWEATING *NAUSEA AND VOMITING THAT IS NOT CONTROLLED WITH YOUR NAUSEA MEDICATION *UNUSUAL SHORTNESS OF BREATH *UNUSUAL BRUISING OR BLEEDING *URINARY PROBLEMS (pain or burning when urinating, or frequent urination) *BOWEL PROBLEMS (unusual diarrhea, constipation, pain near the anus) TENDERNESS IN MOUTH AND THROAT WITH OR WITHOUT PRESENCE OF ULCERS (sore throat, sores in mouth, or a toothache) UNUSUAL RASH, SWELLING OR PAIN  UNUSUAL VAGINAL DISCHARGE OR ITCHING   Items with * indicate a potential emergency and should be followed up as soon as possible or go to the Emergency Department if any problems should occur.  Please show the CHEMOTHERAPY ALERT CARD  or IMMUNOTHERAPY ALERT CARD at check-in to the Emergency Department and triage nurse.  Should you have questions after your visit or need to cancel or reschedule your appointment, please contact [facility]  Dept: [phone]  and follow the prompts.  Office hours are 8:00 a.m. to 4:30 p.m. Monday - Friday. Please note that voicemails left after 4:00 p.m. may not be returned until the following business day.  We are closed weekends and major holidays. You have access to a nurse at all times for urgent questions. Please call the main number to the clinic Dept: [phone] and follow the prompts.   For any non-urgent questions, you may also contact your provider using MyChart. We now offer e-Visits for anyone 64 and older to request care online for non-urgent symptoms. For details visit [email or web address].   Also download the MyChart app! Go to the app store, search MyChart, open the app, select [employer], and log in with your MyChart username and password.

## 2024-11-04 NOTE — Progress Notes (Signed)
 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  Patient receiving concurrent chemotherapy (carboplatin  and taxol ) and radiation.  Past medical history of left partial glossectomy on 06/17/24, neck dissection on 08/21/24, CKD stage III, HTN, dyslipidemia, PVD, smoker.    G-tube placed on 12/26 (16 Fr)  Met with patient during infusion.  Reports that she is eating 3 meals a day but lower volume.  Breakfast is eggs, bacon/sausage, toast, coffee, juice.  Lunch is over 1/2 sandwich.  Supper is meat and vegetable.  Has not tried oral nutrition supplements.    Has received education on using feeding tube for flush and dressing change.     Medications: reviewed  Labs: reviewed  Anthropometrics:   Height: 60 inches Weight: 131 lb UBW: 148 lb  per patient 06/13/24 BMI: 25  11% weight loss in the last 4 months   Estimated Energy Needs  Kcals: 1500-1700 Protein: 70-88 g Fluid: > 1.5 L  NUTRITION DIAGNOSIS: Inadequate oral intake related to cancer and related treatment side effects as evidenced by 11% weight loss in the last 4 months and decreased intake    INTERVENTION:  Recommend patient give 1 carton of osmolite 1.5 daily with flush of 60ml before and after.  Patient agreeable to trying this. Handout provided Samples of ensure provided to try by mouth for additional calories and protein Continue soft, oral foods by mouth     MONITORING, EVALUATION, GOAL: weight trends, tube feeding tolerance   NEXT VISIT: Monday, Jan 5 during infusion  Hendricks Schwandt B. Dasie SOLON, CSO, LDN Registered Dietitian [phone]

## 2024-11-04 NOTE — Progress Notes (Signed)
 [facility] Clinical Social Work  Initial Assessment   Taylor Bush is a 78 y.o. year old female accompanied by patient and daughter. Clinical Social Work was referred by nurse navigator for assessment of psychosocial needs.   SDOH (Social Determinants of Health) assessments performed: Yes SDOH Interventions    Flowsheet Row Telephone from 08/23/2024 in [place] [facility] Clinical Support from 04/29/2024 in [facility] Clinical Support from 05/03/2023 in [facility] [place]  SDOH Interventions     Food Insecurity Interventions Intervention Not Indicated Intervention Not Indicated Intervention Not Indicated  Housing Interventions Intervention Not Indicated Intervention Not Indicated Intervention Not Indicated  Transportation Interventions Intervention Not Indicated Intervention Not Indicated Intervention Not Indicated  Utilities Interventions Intervention Not Indicated Intervention Not Indicated Intervention Not Indicated  Alcohol  Usage Interventions -- Intervention Not Indicated (Score <7) Intervention Not Indicated (Score <7)  Financial Strain Interventions -- Intervention Not Indicated Intervention Not Indicated  Physical Activity Interventions -- Intervention Not Indicated Intervention Not Indicated, Patient Declined  Stress Interventions -- Intervention Not Indicated Intervention Not Indicated  Social Connections Interventions -- Intervention Not Indicated Intervention Not Indicated, Patient Declined  [pt feels like her social needs are being met]  Health Literacy Interventions -- Intervention Not Indicated --    SDOH Screenings   Food Insecurity: No Food Insecurity (09/20/2024)  Housing: Unknown (09/20/2024)  Transportation Needs: No Transportation Needs (09/20/2024)  Utilities: Not At Risk (09/20/2024)  Alcohol  Screen: Low Risk (04/29/2024)  Depression (PHQ2-9): Low Risk (09/20/2024)  Financial Resource  Strain: Low Risk (04/29/2024)  Physical Activity: Insufficiently Active (04/29/2024)  Social Connections: Moderately Isolated (08/21/2024)  Stress: No Stress Concern Present (04/29/2024)  Tobacco Use: Medium Risk (11/01/2024)  Health Literacy: Adequate Health Literacy (04/29/2024)    PHQ 2/9:    09/20/2024   10:00 AM 04/29/2024   11:38 AM 05/03/2023   11:56 AM  Depression screen PHQ 2/9  Decreased Interest 0 0 0  Down, Depressed, Hopeless 0 0 0  PHQ - 2 Score 0 0 0     Distress Screen completed: Yes    10/28/2024    2:04 PM  ON[facility]N DISTRESS SCREENING  Screening Type Initial Screening  How much distress have you been experiencing in the past week? (0-10) 4      Family/Social Information:  Housing Arrangement: patient lives alone Family members/support persons in your life? Daughter / Son Transportation concerns: no, will be transported by her daughter or son  Employment: Retired .  Income source: Actor concerns: No Type of concern: None Food access concerns: no Religious or spiritual practice: None Advanced directives: No Services Currently in place:  Insurance, Family, no SDOH  Coping/ Adjustment to diagnosis: Patient understands treatment plan and what happens next? yes Concerns about diagnosis and/or treatment: No Concerns Patient reported stressors: No Stressors Current coping skills/ strengths: Ability for insight , Active sense of humor , Average or above average intelligence , Capable of independent living , Communication skills , Contractor , General fund of knowledge , Motivation for treatment/growth , Physical Health , and Supportive family/friends     SUMMARY: Current SDOH Barriers:  No SDOH Barriers  Clinical Social Work Clinical Goal(s):  No clinical social work goals at this time  Interventions: Discussed common feeling and emotions when being diagnosed with cancer, and the importance of support during  treatment Informed patient of the support team roles and support services at [facility] Provided CSW contact information and  encouraged patient to call with any questions or concerns   Follow Up Plan: Patient will contact CSW with any support or resource needs Patient verbalizes understanding of plan: Yes    Lizbeth Sprague, LCSW Clinical Social Worker [facility]

## 2024-11-04 NOTE — Progress Notes (Signed)
 Oncology Nurse Navigator Documentation   To provide support, encouragement and care continuity, met with Ms. Ekstrand after her initial RT.   I reviewed the 2-step treatment process, answered questions.  Ms. Precilla completed treatment without difficulty, denied questions/concerns. I reviewed the registration/arrival procedure for subsequent treatments. I encouraged them to call me with questions/concerns as treatments proceed. I helped her with her first dressing change and PEG flush. She was able to provide return demonstration and verbalize that she need to do both daily. She has had one t-tac fall off already and one in place that I'll remove on 11/11/24 if it's still intact.    Delon Jefferson RN, BSN, OCN Head & Neck Oncology Nurse Navigator [facility] at [facility] Phone # [phone]  Fax # [phone]

## 2024-11-05 ENCOUNTER — Ambulatory Visit

## 2024-11-05 ENCOUNTER — Ambulatory Visit: Admitting: Nurse Practitioner

## 2024-11-05 ENCOUNTER — Ambulatory Visit
Admission: RE | Admit: 2024-11-05 | Discharge: 2024-11-05 | Disposition: A | Discharge status: Home / Self Care | Source: Ambulatory Visit | Attending: Radiation Oncology | Admitting: Radiation Oncology

## 2024-11-05 ENCOUNTER — Other Ambulatory Visit: Payer: Self-pay

## 2024-11-05 DIAGNOSIS — C023 Malignant neoplasm of anterior two-thirds of tongue, part unspecified: Secondary | ICD-10-CM | POA: Diagnosis not present

## 2024-11-05 LAB — RAD ONC ARIA SESSION SUMMARY
Course Elapsed Days: 1
Plan Fractions Treated to Date: 2
Plan Prescribed Dose Per Fraction: 2.2 Gy
Plan Total Fractions Prescribed: 30
Plan Total Prescribed Dose: 66 Gy
Reference Point Dosage Given to Date: 4.4 Gy
Reference Point Session Dosage Given: 2.2 Gy
Session Number: 2

## 2024-11-06 ENCOUNTER — Other Ambulatory Visit: Payer: Self-pay

## 2024-11-06 ENCOUNTER — Ambulatory Visit
Admission: RE | Admit: 2024-11-06 | Discharge: 2024-11-06 | Disposition: A | Discharge status: Home / Self Care | Source: Ambulatory Visit | Attending: Radiation Oncology | Admitting: Radiation Oncology

## 2024-11-06 DIAGNOSIS — C023 Malignant neoplasm of anterior two-thirds of tongue, part unspecified: Secondary | ICD-10-CM | POA: Diagnosis not present

## 2024-11-06 LAB — RAD ONC ARIA SESSION SUMMARY
Course Elapsed Days: 2
Plan Fractions Treated to Date: 3
Plan Prescribed Dose Per Fraction: 2.2 Gy
Plan Total Fractions Prescribed: 30
Plan Total Prescribed Dose: 66 Gy
Reference Point Dosage Given to Date: 6.6 Gy
Reference Point Session Dosage Given: 2.2 Gy
Session Number: 3

## 2024-11-07 ENCOUNTER — Other Ambulatory Visit: Payer: Self-pay | Admitting: Family Medicine

## 2024-11-08 ENCOUNTER — Ambulatory Visit
Admission: RE | Admit: 2024-11-08 | Discharge: 2024-11-08 | Disposition: A | Discharge status: Home / Self Care | Source: Ambulatory Visit | Attending: Radiation Oncology | Admitting: Radiation Oncology

## 2024-11-08 ENCOUNTER — Other Ambulatory Visit: Payer: Self-pay

## 2024-11-08 ENCOUNTER — Inpatient Hospital Stay: Attending: Oncology

## 2024-11-08 DIAGNOSIS — Z923 Personal history of irradiation: Secondary | ICD-10-CM | POA: Insufficient documentation

## 2024-11-08 DIAGNOSIS — Z87891 Personal history of nicotine dependence: Secondary | ICD-10-CM | POA: Insufficient documentation

## 2024-11-08 DIAGNOSIS — D630 Anemia in neoplastic disease: Secondary | ICD-10-CM | POA: Insufficient documentation

## 2024-11-08 DIAGNOSIS — Z79899 Other long term (current) drug therapy: Secondary | ICD-10-CM | POA: Insufficient documentation

## 2024-11-08 DIAGNOSIS — R432 Parageusia: Secondary | ICD-10-CM | POA: Insufficient documentation

## 2024-11-08 DIAGNOSIS — T451X5A Adverse effect of antineoplastic and immunosuppressive drugs, initial encounter: Secondary | ICD-10-CM | POA: Insufficient documentation

## 2024-11-08 DIAGNOSIS — Z931 Gastrostomy status: Secondary | ICD-10-CM | POA: Insufficient documentation

## 2024-11-08 DIAGNOSIS — R197 Diarrhea, unspecified: Secondary | ICD-10-CM | POA: Insufficient documentation

## 2024-11-08 DIAGNOSIS — C770 Secondary and unspecified malignant neoplasm of lymph nodes of head, face and neck: Secondary | ICD-10-CM | POA: Insufficient documentation

## 2024-11-08 DIAGNOSIS — Z5111 Encounter for antineoplastic chemotherapy: Secondary | ICD-10-CM | POA: Insufficient documentation

## 2024-11-08 DIAGNOSIS — J90 Pleural effusion, not elsewhere classified: Secondary | ICD-10-CM | POA: Insufficient documentation

## 2024-11-08 DIAGNOSIS — C023 Malignant neoplasm of anterior two-thirds of tongue, part unspecified: Secondary | ICD-10-CM | POA: Insufficient documentation

## 2024-11-08 DIAGNOSIS — I129 Hypertensive chronic kidney disease with stage 1 through stage 4 chronic kidney disease, or unspecified chronic kidney disease: Secondary | ICD-10-CM | POA: Insufficient documentation

## 2024-11-08 DIAGNOSIS — L03119 Cellulitis of unspecified part of limb: Secondary | ICD-10-CM | POA: Insufficient documentation

## 2024-11-08 DIAGNOSIS — N183 Chronic kidney disease, stage 3 unspecified: Secondary | ICD-10-CM | POA: Insufficient documentation

## 2024-11-08 DIAGNOSIS — Z7901 Long term (current) use of anticoagulants: Secondary | ICD-10-CM | POA: Insufficient documentation

## 2024-11-08 DIAGNOSIS — Z7902 Long term (current) use of antithrombotics/antiplatelets: Secondary | ICD-10-CM | POA: Insufficient documentation

## 2024-11-08 DIAGNOSIS — E785 Hyperlipidemia, unspecified: Secondary | ICD-10-CM | POA: Insufficient documentation

## 2024-11-08 DIAGNOSIS — I739 Peripheral vascular disease, unspecified: Secondary | ICD-10-CM | POA: Insufficient documentation

## 2024-11-08 DIAGNOSIS — D701 Agranulocytosis secondary to cancer chemotherapy: Secondary | ICD-10-CM | POA: Insufficient documentation

## 2024-11-08 DIAGNOSIS — Z79630 Long term (current) use of alkylating agent: Secondary | ICD-10-CM | POA: Insufficient documentation

## 2024-11-08 DIAGNOSIS — I4891 Unspecified atrial fibrillation: Secondary | ICD-10-CM | POA: Insufficient documentation

## 2024-11-08 DIAGNOSIS — Z510 Encounter for antineoplastic radiation therapy: Secondary | ICD-10-CM | POA: Insufficient documentation

## 2024-11-08 DIAGNOSIS — I2510 Atherosclerotic heart disease of native coronary artery without angina pectoris: Secondary | ICD-10-CM | POA: Insufficient documentation

## 2024-11-08 DIAGNOSIS — Z5189 Encounter for other specified aftercare: Secondary | ICD-10-CM | POA: Insufficient documentation

## 2024-11-08 DIAGNOSIS — Z79633 Long term (current) use of mitotic inhibitor: Secondary | ICD-10-CM | POA: Insufficient documentation

## 2024-11-08 DIAGNOSIS — M129 Arthropathy, unspecified: Secondary | ICD-10-CM | POA: Insufficient documentation

## 2024-11-08 DIAGNOSIS — R911 Solitary pulmonary nodule: Secondary | ICD-10-CM | POA: Insufficient documentation

## 2024-11-08 DIAGNOSIS — I700 Atherosclerosis of aorta: Secondary | ICD-10-CM | POA: Insufficient documentation

## 2024-11-08 LAB — CBC WITH DIFFERENTIAL (CANCER CENTER ONLY)
Abs Immature Granulocytes: 0.02 K/uL (ref 0.00–0.07)
Basophils Absolute: 0 K/uL (ref 0.0–0.1)
Basophils Relative: 0 %
Eosinophils Absolute: 0.3 K/uL (ref 0.0–0.5)
Eosinophils Relative: 3 %
HCT: 27.5 % — ABNORMAL LOW (ref 36.0–46.0)
Hemoglobin: 9.1 g/dL — ABNORMAL LOW (ref 12.0–15.0)
Immature Granulocytes: 0 %
Lymphocytes Relative: 18 %
Lymphs Abs: 1.4 K/uL (ref 0.7–4.0)
MCH: 30.7 pg (ref 26.0–34.0)
MCHC: 33.1 g/dL (ref 30.0–36.0)
MCV: 92.9 fL (ref 80.0–100.0)
Monocytes Absolute: 0.2 K/uL (ref 0.1–1.0)
Monocytes Relative: 2 %
Neutro Abs: 5.9 K/uL (ref 1.7–7.7)
Neutrophils Relative %: 77 %
Platelet Count: 218 K/uL (ref 150–400)
RBC: 2.96 MIL/uL — ABNORMAL LOW (ref 3.87–5.11)
RDW: 16.6 % — ABNORMAL HIGH (ref 11.5–15.5)
WBC Count: 7.8 K/uL (ref 4.0–10.5)
nRBC: 0 % (ref 0.0–0.2)

## 2024-11-08 LAB — CMP (CANCER CENTER ONLY)
ALT: 12 U/L (ref 0–44)
AST: 19 U/L (ref 15–41)
Albumin: 3.9 g/dL (ref 3.5–5.0)
Alkaline Phosphatase: 80 U/L (ref 38–126)
Anion gap: 11 (ref 5–15)
BUN: 32 mg/dL — ABNORMAL HIGH (ref 8–23)
CO2: 23 mmol/L (ref 22–32)
Calcium: 8.8 mg/dL — ABNORMAL LOW (ref 8.9–10.3)
Chloride: 102 mmol/L (ref 98–111)
Creatinine: 1.59 mg/dL — ABNORMAL HIGH (ref 0.44–1.00)
GFR, Estimated: 33 mL/min — ABNORMAL LOW
Glucose, Bld: 110 mg/dL — ABNORMAL HIGH (ref 70–99)
Potassium: 4.5 mmol/L (ref 3.5–5.1)
Sodium: 137 mmol/L (ref 135–145)
Total Bilirubin: 0.6 mg/dL (ref 0.0–1.2)
Total Protein: 7 g/dL (ref 6.5–8.1)

## 2024-11-08 LAB — RAD ONC ARIA SESSION SUMMARY
Course Elapsed Days: 4
Plan Fractions Treated to Date: 4
Plan Prescribed Dose Per Fraction: 2.2 Gy
Plan Total Fractions Prescribed: 30
Plan Total Prescribed Dose: 66 Gy
Reference Point Dosage Given to Date: 8.8 Gy
Reference Point Session Dosage Given: 2.2 Gy
Session Number: 4

## 2024-11-11 ENCOUNTER — Other Ambulatory Visit (HOSPITAL_COMMUNITY): Payer: Self-pay

## 2024-11-11 ENCOUNTER — Inpatient Hospital Stay

## 2024-11-11 ENCOUNTER — Ambulatory Visit
Admission: RE | Admit: 2024-11-11 | Discharge: 2024-11-11 | Disposition: A | Discharge status: Home / Self Care | Source: Ambulatory Visit | Attending: Radiation Oncology | Admitting: Radiation Oncology

## 2024-11-11 ENCOUNTER — Other Ambulatory Visit: Payer: Self-pay | Admitting: Oncology

## 2024-11-11 ENCOUNTER — Other Ambulatory Visit: Payer: Self-pay | Admitting: Radiation Oncology

## 2024-11-11 ENCOUNTER — Other Ambulatory Visit: Payer: Self-pay

## 2024-11-11 VITALS — BP 130/59 | HR 68 | Temp 97.9°F | Resp 18 | Wt 132.8 lb

## 2024-11-11 DIAGNOSIS — C023 Malignant neoplasm of anterior two-thirds of tongue, part unspecified: Secondary | ICD-10-CM

## 2024-11-11 LAB — RAD ONC ARIA SESSION SUMMARY
Course Elapsed Days: 7
Plan Fractions Treated to Date: 5
Plan Prescribed Dose Per Fraction: 2.2 Gy
Plan Total Fractions Prescribed: 30
Plan Total Prescribed Dose: 66 Gy
Reference Point Dosage Given to Date: 11 Gy
Reference Point Session Dosage Given: 2.2 Gy
Session Number: 5

## 2024-11-11 MED ORDER — LIDOCAINE VISCOUS HCL 2 % MT SOLN
OROMUCOSAL | 3 refills | Status: AC
  Filled 2024-11-11: qty 200, 10d supply, fill #0

## 2024-11-11 MED ORDER — FAMOTIDINE IN NACL 20-0.9 MG/50ML-% IV SOLN
20.0000 mg | Freq: Once | INTRAVENOUS | Status: AC
  Administered 2024-11-11: 20 mg via INTRAVENOUS
  Filled 2024-11-11: qty 50

## 2024-11-11 MED ORDER — SODIUM CHLORIDE 0.9 % IV SOLN
45.0000 mg/m2 | Freq: Once | INTRAVENOUS | Status: AC
  Administered 2024-11-11: 72 mg via INTRAVENOUS
  Filled 2024-11-11: qty 12

## 2024-11-11 MED ORDER — SODIUM CHLORIDE 0.9 % IV SOLN
80.5500 mg | Freq: Once | INTRAVENOUS | Status: AC
  Administered 2024-11-11: 80 mg via INTRAVENOUS
  Filled 2024-11-11: qty 8

## 2024-11-11 MED ORDER — PALONOSETRON HCL INJECTION 0.25 MG/5ML
0.2500 mg | Freq: Once | INTRAVENOUS | Status: AC
  Administered 2024-11-11: 0.25 mg via INTRAVENOUS
  Filled 2024-11-11: qty 5

## 2024-11-11 MED ORDER — SODIUM CHLORIDE 0.9 % IV SOLN: INTRAVENOUS | Status: DC

## 2024-11-11 MED ORDER — SONAFINE EX EMUL
1.0000 | Freq: Once | CUTANEOUS | Status: AC
  Administered 2024-11-11: 1 via TOPICAL

## 2024-11-11 MED ORDER — DEXAMETHASONE SOD PHOSPHATE PF 10 MG/ML IJ SOLN
10.0000 mg | Freq: Once | INTRAMUSCULAR | Status: AC
  Administered 2024-11-11: 10 mg via INTRAVENOUS

## 2024-11-11 MED ORDER — DIPHENHYDRAMINE HCL 50 MG/ML IJ SOLN
25.0000 mg | Freq: Once | INTRAMUSCULAR | Status: AC
  Administered 2024-11-11: 25 mg via INTRAVENOUS
  Filled 2024-11-11: qty 1

## 2024-11-11 NOTE — Progress Notes (Signed)
 Nutrition Follow-up:  Patient with SCC of tongue.  Patient receiving concurrent chemotherapy (carboplatin  and taxol ) and radiation.  Followed by Drs Autumn and Izell.  Met with patient during infusion.  Reports that she felt a little queasy but thought it was because she was hungry.  Ate 2 oranges this am.  Yesterday for lunch ate leftover steak and salad.  Supper last night was slice of ham, collard greens and green beans.  Ate ice cream before bedtime.  Reports no taste.  Not having pain in mouth or when swallowing.  Not drinking oral nutrition supplements by mouth.   Reports that she has been about to give 1 carton of osmolite 1.5 daily for 4-5 days this past week, missed a few days.  Flushing with 60 ml of water before and after each feeding.  Reports that tube feeding is going really slow through tube.   Otherwise tolerating tube feeding well.     Medications: reviewed  Labs: glucose 110, BUN 32, creatinine 1.32, calcium  8.8  Anthropometrics:   Weight 132 lb 12 oz today  131 lb on 12/29 148 lb per patient 06/13/24   Estimated Energy Needs  Kcals: 1500-1700 Protein: 70-88 g Fluid: > 1.5 L  NUTRITION DIAGNOSIS: Inadequate oral intake continues   INTERVENTION:  Continue with 1 carton of osmolite 1.5 via feeding tube. Flush with 60 ml of water before and after each feeding.  Reviewed ways to increase speed of formula going in tube.  1 carton should take 10-15 minutes to infuse.  Encouraged oral nutrition supplements by mouth for added calories and protein Encouraged fluids and soft moist high calorie foods orally.      MONITORING, EVALUATION, GOAL: weight trends, intake, tube feeding   NEXT VISIT: Friday, Jan 16 after radiation  Arianna Haydon B. Dasie SOLON, CSO, LDN Registered Dietitian [phone]

## 2024-11-11 NOTE — Patient Instructions (Signed)
 [facility] - A DEPT OF [facility]  Discharge Instructions: Thank you for choosing [facility] to provide your oncology and hematology care.   If you have a lab appointment with the Cancer Center, please go directly to the Cancer Center and check in at the registration area.   Wear comfortable clothing and clothing appropriate for easy access to any Portacath or PICC line.   We strive to give you quality time with your provider. You may need to reschedule your appointment if you arrive late (15 or more minutes).  Arriving late affects you and other patients whose appointments are after yours.  Also, if you miss three or more appointments without notifying the office, you may be dismissed from the clinic at the provider's discretion.      For prescription refill requests, have your pharmacy contact our office and allow 72 hours for refills to be completed.    Today you received the following chemotherapy and/or immunotherapy agents: Paclitaxel  (Taxol ) & Carboplatin  (Paraplatin )      To help prevent nausea and vomiting after your treatment, we encourage you to take your nausea medication as directed.  BELOW ARE SYMPTOMS THAT SHOULD BE REPORTED IMMEDIATELY: *FEVER GREATER THAN 100.4 F (38 C) OR HIGHER *CHILLS OR SWEATING *NAUSEA AND VOMITING THAT IS NOT CONTROLLED WITH YOUR NAUSEA MEDICATION *UNUSUAL SHORTNESS OF BREATH *UNUSUAL BRUISING OR BLEEDING *URINARY PROBLEMS (pain or burning when urinating, or frequent urination) *BOWEL PROBLEMS (unusual diarrhea, constipation, pain near the anus) TENDERNESS IN MOUTH AND THROAT WITH OR WITHOUT PRESENCE OF ULCERS (sore throat, sores in mouth, or a toothache) UNUSUAL RASH, SWELLING OR PAIN  UNUSUAL VAGINAL DISCHARGE OR ITCHING   Items with * indicate a potential emergency and should be followed up as soon as possible or go to the Emergency Department if any problems should occur.  Please show the  CHEMOTHERAPY ALERT CARD or IMMUNOTHERAPY ALERT CARD at check-in to the Emergency Department and triage nurse.  Should you have questions after your visit or need to cancel or reschedule your appointment, please contact [facility]  Dept: [phone]  and follow the prompts.  Office hours are 8:00 a.m. to 4:30 p.m. Monday - Friday. Please note that voicemails left after 4:00 p.m. may not be returned until the following business day.  We are closed weekends and major holidays. You have access to a nurse at all times for urgent questions. Please call the main number to the clinic Dept: [phone] and follow the prompts.   For any non-urgent questions, you may also contact your provider using MyChart. We now offer e-Visits for anyone 41 and older to request care online for non-urgent symptoms. For details visit [email or web address].   Also download the MyChart app! Go to the app store, search MyChart, open the app, select [place], and log in with your MyChart username and password.

## 2024-11-12 ENCOUNTER — Other Ambulatory Visit: Payer: Self-pay

## 2024-11-12 ENCOUNTER — Ambulatory Visit: Admission: RE | Admit: 2024-11-12 | Discharge: 2024-11-12 | Attending: Radiation Oncology

## 2024-11-12 LAB — RAD ONC ARIA SESSION SUMMARY
Course Elapsed Days: 8
Plan Fractions Treated to Date: 6
Plan Prescribed Dose Per Fraction: 2.2 Gy
Plan Total Fractions Prescribed: 30
Plan Total Prescribed Dose: 66 Gy
Reference Point Dosage Given to Date: 13.2 Gy
Reference Point Session Dosage Given: 2.2 Gy
Session Number: 6

## 2024-11-13 ENCOUNTER — Ambulatory Visit
Admission: RE | Admit: 2024-11-13 | Discharge: 2024-11-13 | Disposition: A | Discharge status: Home / Self Care | Source: Ambulatory Visit | Attending: Radiation Oncology | Admitting: Radiation Oncology

## 2024-11-13 ENCOUNTER — Other Ambulatory Visit: Payer: Self-pay

## 2024-11-13 LAB — RAD ONC ARIA SESSION SUMMARY
Course Elapsed Days: 9
Plan Fractions Treated to Date: 7
Plan Prescribed Dose Per Fraction: 2.2 Gy
Plan Total Fractions Prescribed: 30
Plan Total Prescribed Dose: 66 Gy
Reference Point Dosage Given to Date: 15.4 Gy
Reference Point Session Dosage Given: 2.2 Gy
Session Number: 7

## 2024-11-14 ENCOUNTER — Encounter: Payer: Self-pay | Admitting: Orthopedic Surgery

## 2024-11-14 ENCOUNTER — Ambulatory Visit
Admission: RE | Admit: 2024-11-14 | Discharge: 2024-11-14 | Disposition: A | Discharge status: Home / Self Care | Source: Ambulatory Visit | Attending: Radiation Oncology | Admitting: Radiation Oncology

## 2024-11-14 ENCOUNTER — Ambulatory Visit (INDEPENDENT_AMBULATORY_CARE_PROVIDER_SITE_OTHER): Admitting: Orthopedic Surgery

## 2024-11-14 ENCOUNTER — Other Ambulatory Visit: Payer: Self-pay

## 2024-11-14 DIAGNOSIS — W540XXA Bitten by dog, initial encounter: Secondary | ICD-10-CM

## 2024-11-14 DIAGNOSIS — I96 Gangrene, not elsewhere classified: Secondary | ICD-10-CM

## 2024-11-14 DIAGNOSIS — S91352A Open bite, left foot, initial encounter: Secondary | ICD-10-CM

## 2024-11-14 LAB — RAD ONC ARIA SESSION SUMMARY
Course Elapsed Days: 10
Plan Fractions Treated to Date: 8
Plan Prescribed Dose Per Fraction: 2.2 Gy
Plan Total Fractions Prescribed: 30
Plan Total Prescribed Dose: 66 Gy
Reference Point Dosage Given to Date: 17.6 Gy
Reference Point Session Dosage Given: 2.2 Gy
Session Number: 8

## 2024-11-14 NOTE — Progress Notes (Signed)
 "  Office Visit Note   Patient: Taylor Bush           Date of Birth: May 26, 1946           MRN: [id number] Visit Date: 11/14/2024              Requested by: Watt Mirza, MD [address] [place],  [place] [address] PCP: Watt Mirza, MD  Chief Complaint  Patient presents with   Left Foot - Follow-up    07/12/24 I&D left foot dog bite      HPI: Discussed the use of AI scribe software for clinical note transcription with the patient, who gave verbal consent to proceed.  History of Present Illness Taylor Bush is a 79 year old female who presents for follow-up of a left foot wound status post surgical debridement.  She is four months post-surgical debridement of a traumatic wound on the left foot. The wound has demonstrated significant improvement, and she currently manages it with a simple dressing.  There are no signs of infection, drainage, cellulitis, pain, erythema, or swelling.     Assessment & Plan: Visit Diagnoses:  1. Dog bite of left foot, initial encounter   2. Gangrene of left foot (HCC)     Plan: Assessment and Plan Assessment & Plan Left foot wound, post-surgical debridement Four months post-debridement with healthy epithelialization, no infection or drainage. - Continue current wound care with adhesive dressing. - Follow-up as needed.      Follow-Up Instructions: Return if symptoms worsen or fail to improve.   Ortho Exam  Patient is alert, oriented, no adenopathy, well-dressed, normal affect, normal respiratory effort. Physical Exam EXTREMITIES: Left foot wound 1x1 cm with healthy epithelialization, no cellulitis, no drainage, no infection.      Imaging: No results found. No images are attached to the encounter.  Labs: Lab Results  Component Value Date   HGBA1C 5.5 07/07/2024   HGBA1C 5.7 09/14/2023   HGBA1C 5.9 09/07/2022   CRP 0.8 07/10/2024   CRP <0.5 07/09/2024   CRP 0.5 07/08/2024   REPTSTATUS 07/11/2024 FINAL  07/06/2024   CULT  07/06/2024    NO GROWTH 5 DAYS Performed at [facility], [address], [place], [place] [address]      Lab Results  Component Value Date   ALBUMIN 3.9 11/08/2024   ALBUMIN 4.1 11/01/2024   ALBUMIN 3.1 (L) 07/06/2024    Lab Results  Component Value Date   MG 1.2 (L) 09/08/2024   MG 1.1 (L) 09/05/2024   MG 1.3 (L) 08/21/2024   Lab Results  Component Value Date   VD25OH 27.56 (L) 09/14/2023   VD25OH 37.20 09/07/2022   VD25OH 26.75 (L) 09/13/2021    No results found for: PREALBUMIN    Latest Ref Rng & Units 11/08/2024    2:36 PM 11/01/2024    1:10 PM 11/01/2024    9:03 AM  CBC EXTENDED  WBC 4.0 - 10.5 K/uL 7.8  6.6  6.3   RBC 3.87 - 5.11 MIL/uL 2.96  3.15  3.15   Hemoglobin 12.0 - 15.0 g/dL 9.1  9.7  9.7   HCT 63.9 - 46.0 % 27.5  30.2  29.6   Platelets 150 - 400 K/uL 218  233  243   NEUT# 1.7 - 7.7 K/uL 5.9   4.2   Lymph# 0.7 - 4.0 K/uL 1.4   1.1      There is no height or weight on file to calculate  BMI.  Orders:  No orders of the defined types were placed in this encounter.  No orders of the defined types were placed in this encounter.    Procedures: No procedures performed  Clinical Data: No additional findings.  ROS:  All other systems negative, except as noted in the HPI. Review of Systems  Objective: Vital Signs: There were no vitals taken for this visit.  Specialty Comments:  No specialty comments available.  PMFS History: Patient Active Problem List   Diagnosis Date Noted   Mediastinal adenopathy 10/14/2024   Lung nodule 10/07/2024   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) 09/20/2024   Paroxysmal atrial fibrillation (HCC) 08/21/2024   Peripheral arterial disease 07/12/2024   Dog bite of ankle, sequela 07/06/2024   Gangrene of left foot (HCC) 07/06/2024   B12 deficiency 09/13/2021   Allergic rhinitis    CKD (chronic kidney disease) stage 3, GFR 30-59 ml/min (HCC) 11/29/2013   Anemia of  chronic disease 12/16/2011   Atherosclerosis of native artery of extremity with intermittent claudication 06/17/2009   Lumbar back pain with radiculopathy affecting left lower extremity 06/03/2009   Mixed hyperlipidemia 12/15/2008   Essential hypertension 12/15/2008   Open-angle glaucoma 10/27/2008   Past Medical History:  Diagnosis Date   Allergic rhinitis    Anemia in chronic kidney disease (CKD)    B12 deficiency 09/13/2021   Blood transfusion 2012   and 2013   Chronic low back pain    CKD (chronic kidney disease) stage 3, GFR 30-59 ml/min (HCC) 11/29/2013   HLD (hyperlipidemia)    HTN (hypertension)    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) 09/20/2024   Open-angle glaucoma    PAD (peripheral artery disease)    with intermittent claudication, ABI 0.72 on Rt and 0.73 on L when assessed in 2010.    Paroxysmal atrial fibrillation (HCC) 08/21/2024    Family History  Problem Relation Age of Onset   Colon polyps Mother 83   Heart attack Father        72s   Stroke Neg Hx        also no PAD or aneurysm   Colon cancer Neg Hx    Esophageal cancer Neg Hx    Stomach cancer Neg Hx    Rectal cancer Neg Hx     Past Surgical History:  Procedure Laterality Date   ABDOMINAL AORTOGRAM W/LOWER EXTREMITY N/A 07/09/2024   Procedure: ABDOMINAL AORTOGRAM W/LOWER EXTREMITY;  Surgeon: Serene Gaile ORN, MD;  Location: [facility];  Service: Cardiovascular;  Laterality: N/A;   abnormal vascular studies  05/07/2009   ABI's 70, R SFA > 50%^ blockage    APPLICATION OF WOUND VAC Left 07/12/2024   Procedure: APPLICATION, WOUND VAC;  Surgeon: Harden Jerona GAILS, MD;  Location: [facility];  Service: Orthopedics;  Laterality: Left;   CATARACT EXTRACTION, BILATERAL  2011   EXCISION OF TONGUE LESION WITH LASER N/A 08/31/2015   Procedure: BIOPSY AND CO2 LASER OF TONGUE LESION ;  Surgeon: Ida Loader, MD;  Location: [facility];  Service: ENT;  Laterality: N/A;   EXCISION OF  TONGUE LESION WITH LASER Left 06/29/2020   Procedure: EXCISION OF TONGUE LESION/CO2 laser w/biopsy;  Surgeon: Loader Ida, MD;  Location: [facility];  Service: ENT;  Laterality: Left;   EXCISION OF TONGUE LESION WITH LASER Bilateral 10/02/2023   Procedure: ORAL BIOPSY AND CO2 LASER ABLATATION OF TONGUE LESION;  Surgeon: Loader Ida, MD;  Location: [facility];  Service: ENT;  Laterality: Bilateral;   EXCISION ORAL LESION WITH CO2 LASER N/A 12/08/2021   Procedure: EXCISION OF ORAL ORAL CAVITY LESION WITH CO2 LASER;  Surgeon: Jesus Oliphant, MD;  Location: [facility];  Service: ENT;  Laterality: N/A;   EYE SURGERY     GLOSSECTOMY, PARTIAL Left 06/17/2024   Procedure: GLOSSECTOMY, PARTIAL;  Surgeon: Jesus Oliphant, MD;  Location: [facility];  Service: ENT;  Laterality: Left;   INCISION AND DRAINAGE OF WOUND Left 07/12/2024   Procedure: IRRIGATION AND DEBRIDEMENT WOUND;  Surgeon: Harden Jerona GAILS, MD;  Location: [facility];  Service: Orthopedics;  Laterality: Left;  LEFT FOOT DEBRIDEMENT   IR GASTROSTOMY TUBE MOD SED  11/01/2024   IR IMAGING GUIDED PORT INSERTION  11/01/2024   JOINT REPLACEMENT     LOWER EXTREMITY ANGIOGRAPHY  07/09/2024   Procedure: Lower Extremity Angiography;  Surgeon: Serene Gaile ORN, MD;  Location: [facility];  Service: Cardiovascular;;   LOWER EXTREMITY INTERVENTION  07/09/2024   Procedure: LOWER EXTREMITY INTERVENTION;  Surgeon: Serene Gaile ORN, MD;  Location: [facility];  Service: Cardiovascular;;   OTHER SURGICAL HISTORY     ganglion cyst removed left wrist    PERIPHERAL INTRAVASCULAR LITHOTRIPSY  07/09/2024   Procedure: PERIPHERAL INTRAVASCULAR LITHOTRIPSY;  Surgeon: Serene Gaile ORN, MD;  Location: [facility];  Service: Cardiovascular;;   PERIPHERAL VASCULAR ATHERECTOMY  07/09/2024   Procedure: PERIPHERAL VASCULAR ATHERECTOMY;  Surgeon: Serene Gaile ORN, MD;  Location: [facility];  Service: Cardiovascular;;   RADICAL NECK DISSECTION  Left 08/21/2024   Procedure: DISSECTION, NECK, RADICAL;  Surgeon: Jesus Oliphant, MD;  Location: [facility] OR;  Service: ENT;  Laterality: Left;   TOTAL HIP ARTHROPLASTY  10/03/2011   Procedure: TOTAL HIP ARTHROPLASTY;  Surgeon: Dempsey GAILS Moan;  Location: [facility];  Service: Orthopedics;  Laterality: Right;   TOTAL HIP ARTHROPLASTY  12/21/2011   Procedure: TOTAL HIP ARTHROPLASTY;  Surgeon: Dempsey GAILS Moan, MD;  Location: [facility];  Service: Orthopedics;  Laterality: Left;   TUBAL LIGATION     VIDEO BRONCHOSCOPY WITH ENDOBRONCHIAL NAVIGATION Left 10/14/2024   Procedure: VIDEO BRONCHOSCOPY WITH ENDOBRONCHIAL NAVIGATION;  Surgeon: Shelah Lamar RAMAN, MD;  Location: [facility];  Service: Pulmonary;  Laterality: Left;   VIDEO BRONCHOSCOPY WITH ENDOBRONCHIAL ULTRASOUND Bilateral 10/14/2024   Procedure: BRONCHOSCOPY, WITH EBUS;  Surgeon: Shelah Lamar RAMAN, MD;  Location: [facility];  Service: Pulmonary;  Laterality: Bilateral;   Social History   Occupational History   Not on file  Tobacco Use   Smoking status: Former    Current packs/day: 0.00    Types: Cigarettes    Quit date: 11/07/2008    Years since quitting: 16.0   Smokeless tobacco: Never  Vaping Use   Vaping status: Never Used  Substance and Sexual Activity   Alcohol  use: No   Drug use: No   Sexual activity: Not on file         "

## 2024-11-15 ENCOUNTER — Inpatient Hospital Stay: Admitting: Oncology

## 2024-11-15 ENCOUNTER — Encounter: Payer: Self-pay | Admitting: Oncology

## 2024-11-15 ENCOUNTER — Ambulatory Visit
Admission: RE | Admit: 2024-11-15 | Discharge: 2024-11-15 | Disposition: A | Source: Ambulatory Visit | Attending: Radiation Oncology

## 2024-11-15 ENCOUNTER — Inpatient Hospital Stay

## 2024-11-15 ENCOUNTER — Telehealth: Payer: Self-pay

## 2024-11-15 ENCOUNTER — Other Ambulatory Visit: Payer: Self-pay

## 2024-11-15 VITALS — BP 129/66 | HR 82 | Temp 97.2°F | Resp 17 | Wt 131.3 lb

## 2024-11-15 DIAGNOSIS — C023 Malignant neoplasm of anterior two-thirds of tongue, part unspecified: Secondary | ICD-10-CM | POA: Diagnosis not present

## 2024-11-15 DIAGNOSIS — T451X5A Adverse effect of antineoplastic and immunosuppressive drugs, initial encounter: Secondary | ICD-10-CM | POA: Diagnosis not present

## 2024-11-15 DIAGNOSIS — D701 Agranulocytosis secondary to cancer chemotherapy: Secondary | ICD-10-CM

## 2024-11-15 LAB — CMP (CANCER CENTER ONLY)
ALT: 13 U/L (ref 0–44)
AST: 19 U/L (ref 15–41)
Albumin: 3.6 g/dL (ref 3.5–5.0)
Alkaline Phosphatase: 70 U/L (ref 38–126)
Anion gap: 10 (ref 5–15)
BUN: 23 mg/dL (ref 8–23)
CO2: 23 mmol/L (ref 22–32)
Calcium: 8.4 mg/dL — ABNORMAL LOW (ref 8.9–10.3)
Chloride: 102 mmol/L (ref 98–111)
Creatinine: 1.24 mg/dL — ABNORMAL HIGH (ref 0.44–1.00)
GFR, Estimated: 44 mL/min — ABNORMAL LOW
Glucose, Bld: 112 mg/dL — ABNORMAL HIGH (ref 70–99)
Potassium: 5.1 mmol/L (ref 3.5–5.1)
Sodium: 135 mmol/L (ref 135–145)
Total Bilirubin: 0.6 mg/dL (ref 0.0–1.2)
Total Protein: 6.7 g/dL (ref 6.5–8.1)

## 2024-11-15 LAB — CBC WITH DIFFERENTIAL (CANCER CENTER ONLY)
Abs Immature Granulocytes: 0.02 K/uL (ref 0.00–0.07)
Basophils Absolute: 0 K/uL (ref 0.0–0.1)
Basophils Relative: 1 %
Eosinophils Absolute: 0.1 K/uL (ref 0.0–0.5)
Eosinophils Relative: 3 %
HCT: 25.7 % — ABNORMAL LOW (ref 36.0–46.0)
Hemoglobin: 8.5 g/dL — ABNORMAL LOW (ref 12.0–15.0)
Immature Granulocytes: 1 %
Lymphocytes Relative: 21 %
Lymphs Abs: 0.5 K/uL — ABNORMAL LOW (ref 0.7–4.0)
MCH: 30.7 pg (ref 26.0–34.0)
MCHC: 33.1 g/dL (ref 30.0–36.0)
MCV: 92.8 fL (ref 80.0–100.0)
Monocytes Absolute: 0.1 K/uL (ref 0.1–1.0)
Monocytes Relative: 3 %
Neutro Abs: 1.5 K/uL — ABNORMAL LOW (ref 1.7–7.7)
Neutrophils Relative %: 71 %
Platelet Count: 208 K/uL (ref 150–400)
RBC: 2.77 MIL/uL — ABNORMAL LOW (ref 3.87–5.11)
RDW: 15.9 % — ABNORMAL HIGH (ref 11.5–15.5)
WBC Count: 2.1 K/uL — ABNORMAL LOW (ref 4.0–10.5)
nRBC: 0 % (ref 0.0–0.2)

## 2024-11-15 LAB — RAD ONC ARIA SESSION SUMMARY
Course Elapsed Days: 11
Plan Fractions Treated to Date: 9
Plan Prescribed Dose Per Fraction: 2.2 Gy
Plan Total Fractions Prescribed: 30
Plan Total Prescribed Dose: 66 Gy
Reference Point Dosage Given to Date: 19.8 Gy
Reference Point Session Dosage Given: 2.2 Gy
Session Number: 9

## 2024-11-15 NOTE — Telephone Encounter (Signed)
 Patient notified per Dr. Autumn, that she will proceed with Chemotherapy this Monday at 0900. Patient understood and agreed with the plan.

## 2024-11-15 NOTE — Assessment & Plan Note (Addendum)
 Please review oncology history for additional details and timeline of events.  Post-surgical status following tongue cancer with cervical lymph node involvement and extracapsular extension.   Neck dissection on October 15th with removal of 33 lymph nodes, 3 with cancer. High risk of recurrence due to extracapsular extension.   CT chest on 09/18/2024 showed mild mediastinal lymphadenopathy and metastatic involvement could not be excluded.  8 mm anterior left upper lobe lung nodule noted as well.  On her consultation with us  on 09/20/2024, request placed for staging PET-CT scan for further evaluation of chest lymphadenopathy.   On 10/01/2024, PET scan showed hypermetabolic left cervical lymph node, indicative of residual/recurrent disease. Hypermetabolic 4 mm parotid nodule, worrisome for malignancy. No abnormal hypermetabolism associated with the left posterior tongue, as questioned on CT neck 09/18/2024. Small right pleural effusion. Aortic atherosclerosis (ICD10-I70.0). Coronary artery calcification. Enlarged pulmonic trunk, indicative of pulmonary arterial hypertension.  She had a follow-up CT super D of chest on 10/11/2024 which showed  7 mm anterior segment left upper lobe nodule, unchanged from 09/18/2024 and too small for PET resolution on 10/01/2024. This could be followed with CT chest without contrast in 3 months, as clinically indicated, in this patient with a history of tongue cancer. Small bilateral pleural effusions.  On 10/14/2024, she underwent bronchoscopy and biopsy of the left upper lobe lung nodule and lymph nodes and pathology showed reactive cells without evidence of malignancy.  pT2, pN3B, M0 disease. Stage IVb disease.   Plan made to proceed with concurrent chemoradiation.   Given her CKD, she is not a candidate for cisplatin.  Hence plan made to proceed with weekly carboplatin /paclitaxel .  Carboplatin  dose reduced to AUC 1.5, considering her age and  comorbidities.  Received cycle 1 of carboplatin  and paclitaxel  on 11/04/2024.  Completed 2 doses so far.  Tolerating concurrent chemoradiation well without major side effects.  Labs today revealed leukopenia with white count of 2100.  ANC is 1500.  Overall no dose-limiting toxicities.  Will proceed with cycle 3 of carboplatin  and paclitaxel  as scheduled on 11/18/2024.  However given progressive leukopenia, we will give her Zarxio  300 mcg subcutaneously daily for 3 days next week and plan to repeat as needed with future cycles.  Will continue treatments weekly during the course of radiation.  RTC in 1 week for follow-up.

## 2024-11-15 NOTE — Assessment & Plan Note (Signed)
 She developed chemotherapy-induced leukopenia with a significant decrease in white blood cell count.  White count 2100, ANC 1500.  Since ANC is more than 1000, we can proceed with chemotherapy as scheduled on Monday, 11/18/2024.  She is at increased risk for infection due to neutropenia. Granulocyte colony-stimulating factor (G-CSF) injections are planned to support leukocyte production regardless of chemotherapy status.  - Submitted request for insurance approval for G-CSF injections with Zarxio  300 mcg subcutaneously daily for 3 days next week.  - Scheduled booster injections to coincide with radiation appointments for convenience. - Advised her to wear a mask and avoid crowds to minimize infection risk.

## 2024-11-15 NOTE — Progress Notes (Signed)
 "  [facility]  ONCOLOGY CLINIC PROGRESS NOTE   Patient Care Team: Watt Mirza, MD as PCP - General Jordan, Peter M, MD as PCP - Cardiology (Cardiology) Jesus Oliphant, MD as Consulting Physician (Otolaryngology) Izell Domino, MD as Attending Physician (Radiation Oncology) Malmfelt, Delon CROME, RN as Oncology Nurse Navigator Autumn Millman, MD as Consulting Physician (Oncology) Shelah Lamar RAMAN, MD as Consulting Physician (Pulmonary Disease)  PATIENT NAME: Taylor [facility]   MR#: [id number] DOB: 1946-06-19  Date of visit: 11/15/2024   ASSESSMENT & PLAN:   Taylor [facility] is a 79 y.o. pleasant lady with a past medical history of CKD stage III, hypertension, dyslipidemia, arthritis, peripheral vascular disease, past smoking, was referred to our clinic for recently diagnosed squamous of carcinoma of the tongue status post left partial glossectomy on 06/17/2024 followed by neck dissection on 08/21/2024.  pT2, pN3B, M0 disease. Stage IVb disease.   Malignant neoplasm of anterior two-thirds of tongue [facility]) Please review oncology history for additional details and timeline of events.  Post-surgical status following tongue cancer with cervical lymph node involvement and extracapsular extension.   Neck dissection on October 15th with removal of 33 lymph nodes, 3 with cancer. High risk of recurrence due to extracapsular extension.   CT chest on 09/18/2024 showed mild mediastinal lymphadenopathy and metastatic involvement could not be excluded.  8 mm anterior left upper lobe lung nodule noted as well.  On her consultation with us  on 09/20/2024, request placed for staging PET-CT scan for further evaluation of chest lymphadenopathy.   On 10/01/2024, PET scan showed hypermetabolic left cervical lymph node, indicative of residual/recurrent disease. Hypermetabolic 4 mm parotid nodule, worrisome for malignancy. No abnormal hypermetabolism associated with the left posterior tongue, as  questioned on CT neck 09/18/2024. Small right pleural effusion. Aortic atherosclerosis (ICD10-I70.0). Coronary artery calcification. Enlarged pulmonic trunk, indicative of pulmonary arterial hypertension.  She had a follow-up CT super D of chest on 10/11/2024 which showed  7 mm anterior segment left upper lobe nodule, unchanged from 09/18/2024 and too small for PET resolution on 10/01/2024. This could be followed with CT chest without contrast in 3 months, as clinically indicated, in this patient with a history of tongue cancer. Small bilateral pleural effusions.  On 10/14/2024, she underwent bronchoscopy and biopsy of the left upper lobe lung nodule and lymph nodes and pathology showed reactive cells without evidence of malignancy.  pT2, pN3B, M0 disease. Stage IVb disease.   Plan made to proceed with concurrent chemoradiation.   Given her CKD, she is not a candidate for cisplatin.  Hence plan made to proceed with weekly carboplatin /paclitaxel .  Carboplatin  dose reduced to AUC 1.5, considering her age and comorbidities.  Received cycle 1 of carboplatin  and paclitaxel  on 11/04/2024.  Completed 2 doses so far.  Tolerating concurrent chemoradiation well without major side effects.  Labs today revealed leukopenia with white count of 2100.  ANC is 1500.  Overall no dose-limiting toxicities.  Will proceed with cycle 3 of carboplatin  and paclitaxel  as scheduled on 11/18/2024.  However given progressive leukopenia, we will give her Zarxio  300 mcg subcutaneously daily for 3 days next week and plan to repeat as needed with future cycles.  Will continue treatments weekly during the course of radiation.  RTC in 1 week for follow-up.  Chemotherapy induced neutropenia She developed chemotherapy-induced leukopenia with a significant decrease in white blood cell count.  White count 2100, ANC 1500.  Since ANC is more than 1000, we can proceed with chemotherapy as  scheduled on Monday, 11/18/2024.  She is at  increased risk for infection due to neutropenia. Granulocyte colony-stimulating factor (G-CSF) injections are planned to support leukocyte production regardless of chemotherapy status.  - Submitted request for insurance approval for G-CSF injections with Zarxio  300 mcg subcutaneously daily for 3 days next week.  - Scheduled booster injections to coincide with radiation appointments for convenience. - Advised her to wear a mask and avoid crowds to minimize infection risk.   Chronic kidney disease She has mild chronic kidney disease with fluctuating creatinine. Chemotherapy regimen and dosing were selected to minimize nephrotoxicity, with ongoing monitoring and dose adjustments as needed. - Selected carboplatin  for chemotherapy and dose-reduced based on renal function. - Planned ongoing adjustment of chemotherapy dosing according to renal function.  Anemia in neoplastic disease She has chronic anemia related to neoplastic disease, with hemoglobin stable at 8.5 g/dL and no acute changes. - Monitored hemoglobin levels with routine blood work prior to chemotherapy.  I reviewed lab results and outside records for this visit and discussed relevant results with the patient. Diagnosis, plan of care and treatment options were also discussed in detail with the patient. Opportunity provided to ask questions and answers provided to her apparent satisfaction. Provided instructions to call our clinic with any problems, questions or concerns prior to return visit. I recommended to continue follow-up with PCP and sub-specialists. She verbalized understanding and agreed with the plan.   NCCN guidelines have been consulted in the planning of this patients care.  I spent a total of 43 minutes during this encounter with the patient including review of chart and various tests results, discussions about plan of care and coordination of care plan.   Chinita Patten, MD  11/15/2024 2:05 PM  [facility] - A DEPT OF [facility] [address] [place] [address] Dept: [phone] Dept Fax: [phone]    CHIEF COMPLAINT/ REASON FOR VISIT:   Squamous of carcinoma of the tongue status post left partial glossectomy on 06/17/2024 followed by neck dissection on 08/21/2024.  pT2, pN3B, M0 disease. Stage IVb disease.   Current Treatment: Adjuvant concurrent chemoradiation with weekly carboplatin /paclitaxel , starting from 11/04/2024  INTERVAL HISTORY:    Discussed the use of AI scribe software for clinical note transcription with the patient, who gave verbal consent to proceed.  History of Present Illness Taylor [facility] is a 79 year old female with malignant neoplasm of the anterior two-thirds of the tongue who presents for oncology follow-up during active chemoradiation complicated by chemotherapy-induced neutropenia.  She has completed two cycles of chemotherapy and nearly two weeks of radiation therapy for her tongue cancer. She is tolerating treatment well overall, with no significant nausea or vomiting. She reports dysgeusia, but is able to maintain oral intake and hydration, primarily with water. She uses her feeding tube for flushing and water administration. She denies abnormal bleeding, bruising, or infectious symptoms including fever, chills, or sweats.  Recent laboratory evaluation revealed leukopenia, with white blood cell count decreasing from 7,800 last week to 2,100 today. Hemoglobin has decreased from 9.1 to 8.5, remaining above transfusion threshold. Platelet count is stable and within normal limits. She denies fatigue; her red cell count, though low, is not significantly below baseline.  She has received influenza, RSV, and COVID vaccinations.    I have reviewed the past medical history, past surgical history, social history and family history with the patient and they are unchanged from previous note.  HISTORY OF  PRESENT ILLNESS:   ONCOLOGY HISTORY:   She has a history of recurrent oral leukoplakia followed by Dr. Jesus since at least 2016. She also had a medical history notable for stage 3 CKD, chronic a-fib, and PAD.   Upon record review, she presented to Dr. Jesus on 08/14/23 for evaluation of new white spots on the posterior left lateral aspect of the tongue. Recurrent oral leukoplakia was suspected and laser ablation and biopsies under anesthesia were recommended (which she had undergone in the past in the same setting). Biopsies of the left tongue were promptly obtained on 10/02/23 and showed moderate squamous dysplasia without evidence of carcinoma.    She remained without issues to her tongue in the following months but later presented to Dr. Jesus on 06/13/24 with c/o worsening irritation to the left side of her tongue over the past several months. Oral exam performed at that time noted a 3 cm raised ulcerative lesion with surrounding leukoplakia to the left posterior lateral tongue.    Based on Dr. Godfrey recommendations, she opted to proceed with a left partial glossectomy on 06/17/24. Pathology from the procedure revealed: tumor the size of 1.9 cm; histology of invasive well to moderately differentiated squamous cell carcinoma invading a depth of 0.9 cm, along with adjacent high-grade squamous dysplasia; p16 negative; angiolymphatic invasion present; all margins negative for dysplasia or carcinoma. No lymph nodes were excised.    Based on the depth of invasion, she underwent left modified neck dissection on 08/21/24. Pathology from the procedure revealed: 03/33 lymph nodes positive for metastatic SCC; the largest lymph node involved was a level II lymph node measuring 1.9 cm. The same large level II lymph node was also positive for focal extracapsular extension; with a distance of less than 2 mm from the lymph node capsule (microscopic extranodal extension).    Her post-op course was uncomplicated  per her subsequent follow-up examinations by Dr. Jesus. Radiation therapy has been recommended and Dr. Jesus also referred her to medical oncology.    Pertinent imaging performed thus far includes:  -- Soft tissue neck CT with contrast on 09/18/24 which demonstrated: a prominent heterogeneous left level 3/4 cervical lymph node measuring 1.0 x 1.2 cm with central hypoattenuation concerning for possible central necrosis; subtle asymmetric soft tissue at the left posterior tongue (likely related to recent surgical changes); and postsurgical changes of the left neck with presumed surgical absence of the left submandibular gland.  -- CT of the abdomen on 09/18/24 showed no evidence of metastatic disease in the abdomen or acute findings overall.  --CT Chest 09/18/24:  Lower cervical lymphadenopathy characterized on CT neck performed at the same time and reported separately. Mild mediastinal lymphadenopathy. Metastatic involvement not excluded. PET-CT suggested to further evaluate. 8 mm anterior left upper lobe pulmonary nodule shows mixed attenuation with sub solid features. This nodule warrants close follow-up as metastatic disease or lung primary not excluded. PET-CT may prove helpful.  Small right pleural effusion with dependent atelectasis right lower lobe.   On her consultation with us  on 09/20/2024, request placed for staging PET-CT scan for further evaluation of chest lymphadenopathy.   On 10/01/2024, PET scan showed hypermetabolic left cervical lymph node, indicative of residual/recurrent disease. Hypermetabolic 4 mm parotid nodule, worrisome for malignancy. No abnormal hypermetabolism associated with the left posterior tongue, as questioned on CT neck 09/18/2024. Small right pleural effusion. Aortic atherosclerosis (ICD10-I70.0). Coronary artery calcification. Enlarged pulmonic trunk, indicative of pulmonary arterial hypertension.  She had a follow-up CT  super D of chest on 10/11/2024 which showed  7  mm anterior segment left upper lobe nodule, unchanged from 09/18/2024 and too small for PET resolution on 10/01/2024. This could be followed with CT chest without contrast in 3 months, as clinically indicated, in this patient with a history of tongue cancer. Small bilateral pleural effusions.  On 10/14/2024, she underwent bronchoscopy and biopsy of the left upper lobe lung nodule and lymph nodes and pathology showed reactive cells without evidence of malignancy.  pT2, pN3B, M0 disease. Stage IVb disease.   Plan made to proceed with concurrent chemoradiation.   Given her CKD, she is not a candidate for cisplatin.  Hence plan made to proceed with weekly carboplatin /paclitaxel .  Carboplatin  dose reduced to AUC 1.5, considering her age and comorbidities.  She started concurrent chemoradiation from 11/04/2024.   Oncology History  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)  09/20/2024 Initial Diagnosis   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)   09/20/2024 Cancer Staging   Staging form: Oral Cavity, AJCC 8th Edition - Pathologic stage from 09/20/2024: Stage IVB (pT2, pN3b, cM0) - Signed by Autumn Millman, MD on 10/18/2024 Stage prefix: Initial diagnosis   11/04/2024 -  Chemotherapy   Patient is on Treatment Plan : HEAD/NECK Carboplatin  + Paclitaxel  + XRT q7d         REVIEW OF SYSTEMS:   Review of Systems - Oncology  All other pertinent systems were reviewed with the patient and are negative.  ALLERGIES: She is allergic to statins.  MEDICATIONS:  Current Outpatient Medications  Medication Sig Dispense Refill   amLODipine  (NORVASC ) 10 MG tablet TAKE 1 TABLET BY MOUTH DAILY 100 tablet 2   apixaban  (ELIQUIS ) 5 MG TABS tablet Take 1 tablet (5 mg total) by mouth 2 (two) times daily. 60 tablet 3   Faricimab -svoa (VABYSMO  IZ) 1 Dose by Intravitreal route every 6 (six) weeks.     HYDROcodone -acetaminophen  (NORCO/VICODIN) 5-325 MG tablet Take 1 tablet by  mouth every 6 (six) hours as needed for moderate pain (pain score 4-6). 30 tablet 0   lidocaine  (XYLOCAINE ) 2 % solution Patient: Mix 1 part 2% viscous lidocaine , 1 part water. Swish & swallow 10mL of diluted mixture 30 minutes before meals and at bedtime, up to 4 times daily 200 mL 3   lisinopril  (ZESTRIL ) 20 MG tablet TAKE 1 TABLET BY MOUTH DAILY 100 tablet 2   metoprolol  (TOPROL -XL) 200 MG 24 hr tablet TAKE 1 TABLET BY MOUTH DAILY 100 tablet 2   traMADol  (ULTRAM ) 50 MG tablet Take 1 tablet (50 mg total) by mouth every 8 (eight) hours as needed. 40 tablet 2   atorvastatin  (LIPITOR ) 80 MG tablet Take 1 tablet (80 mg total) by mouth daily. (Patient not taking: Reported on 11/15/2024) 30 tablet 3   clopidogrel  (PLAVIX ) 75 MG tablet Take 1 tablet (75 mg total) by mouth daily with breakfast. (Patient not taking: Reported on 11/15/2024) 30 tablet 3   lidocaine -prilocaine  (EMLA ) cream Apply to affected area once (Patient not taking: Reported on 11/15/2024) 30 g 3   ondansetron  (ZOFRAN ) 8 MG tablet Take 1 tablet (8 mg total) by mouth every 8 (eight) hours as needed for nausea or vomiting. Start on the third day after chemotherapy. (Patient not taking: Reported on 11/15/2024) 30 tablet 1   ondansetron  (ZOFRAN -ODT) 4 MG disintegrating tablet Take 1 tablet (4 mg total) by mouth every 8 (eight) hours as needed for nausea or vomiting. (Patient not taking: Reported on 11/15/2024) 20 tablet  0   prochlorperazine  (COMPAZINE ) 10 MG tablet Take 1 tablet (10 mg total) by mouth every 6 (six) hours as needed for nausea or vomiting. (Patient not taking: Reported on 11/15/2024) 30 tablet 1   sodium fluoride (FLUORISHIELD) 1.1 % GEL dental gel Apply thin layer of fluoride gel inside the trays and wear them for 20 minutes nightly. Start after completion of HNRT (Patient not taking: Reported on 11/15/2024)     No current facility-administered medications for this visit.     VITALS:   Blood pressure 129/66, pulse 82, temperature (!)  97.2 F (36.2 C), resp. rate 17, weight 131 lb 4.8 oz (59.6 kg), SpO2 99%.  Wt Readings from Last 3 Encounters:  11/15/24 131 lb 4.8 oz (59.6 kg)  11/11/24 132 lb 12 oz (60.2 kg)  11/04/24 131 lb (59.4 kg)    Body mass index is 25.64 kg/m.    Onc Performance Status - 11/15/24 0942       ECOG Perf Status   ECOG Perf Status Restricted in physically strenuous activity but ambulatory and able to carry out work of a light or sedentary nature, e.g., light house work, office work      KPS SCALE   KPS % SCORE Able to carry on normal activity, minor s/s of disease          PHYSICAL EXAM:   Physical Exam Constitutional:      General: She is not in acute distress.    Appearance: Normal appearance.  HENT:     Head: Normocephalic and atraumatic.     Mouth/Throat:     Comments: Well-healed scar along the left tongue bordering the floor of mouth  Cardiovascular:     Rate and Rhythm: Normal rate.  Pulmonary:     Effort: Pulmonary effort is normal. No respiratory distress.  Abdominal:     General: There is no distension.  Lymphadenopathy:     Cervical: Cervical adenopathy (Left-sided neck dissection scar has healed well.  Mild lymphedema noted.) present.  Neurological:     General: No focal deficit present.     Mental Status: She is alert and oriented to person, place, and time.  Psychiatric:        Mood and Affect: Mood normal.        Behavior: Behavior normal.      LABORATORY DATA:   I have reviewed the data as listed.  Results for orders placed or performed in visit on 11/15/24  CBC with Differential ([facility] Only)  Result Value Ref Range   WBC Count 2.1 (L) 4.0 - 10.5 K/uL   RBC 2.77 (L) 3.87 - 5.11 MIL/uL   Hemoglobin 8.5 (L) 12.0 - 15.0 g/dL   HCT 74.2 (L) 63.9 - 53.9 %   MCV 92.8 80.0 - 100.0 fL   MCH 30.7 26.0 - 34.0 pg   MCHC 33.1 30.0 - 36.0 g/dL   RDW 84.0 (H) 88.4 - 84.4 %   Platelet Count 208 150 - 400 K/uL   nRBC 0.0 0.0 - 0.2 %   Neutrophils  Relative % 71 %   Neutro Abs 1.5 (L) 1.7 - 7.7 K/uL   Lymphocytes Relative 21 %   Lymphs Abs 0.5 (L) 0.7 - 4.0 K/uL   Monocytes Relative 3 %   Monocytes Absolute 0.1 0.1 - 1.0 K/uL   Eosinophils Relative 3 %   Eosinophils Absolute 0.1 0.0 - 0.5 K/uL   Basophils Relative 1 %   Basophils Absolute 0.0 0.0 - 0.1 K/uL  WBC Morphology MORPHOLOGY UNREMARKABLE    Smear Review See Note    Immature Granulocytes 1 %   Abs Immature Granulocytes 0.02 0.00 - 0.07 K/uL   Schistocytes PRESENT    Ovalocytes PRESENT   CMP ([facility] only)  Result Value Ref Range   Sodium 135 135 - 145 mmol/L   Potassium 5.1 3.5 - 5.1 mmol/L   Chloride 102 98 - 111 mmol/L   CO2 23 22 - 32 mmol/L   Glucose, Bld 112 (H) 70 - 99 mg/dL   BUN 23 8 - 23 mg/dL   Creatinine 8.75 (H) 9.55 - 1.00 mg/dL   Calcium  8.4 (L) 8.9 - 10.3 mg/dL   Total Protein 6.7 6.5 - 8.1 g/dL   Albumin 3.6 3.5 - 5.0 g/dL   AST 19 15 - 41 U/L   ALT 13 0 - 44 U/L   Alkaline Phosphatase 70 38 - 126 U/L   Total Bilirubin 0.6 0.0 - 1.2 mg/dL   GFR, Estimated 44 (L) >60 mL/min   Anion gap 10 5 - 15       RADIOGRAPHIC STUDIES:  I have personally reviewed the radiological images as listed and agree with the findings in the report.  IR Gastrostomy Tube INDICATION: Chemotherapy with anticipated ineffective nurtritional intake. History of head and neck cancer.  EXAM: PERCUTANEOUS GASTROSTOMY TUBE PLACEMENT  COMPARISON:  PET-CT, 10/01/2024  MEDICATIONS: Ancef  2 gm IV; Antibiotics were administered within 1 hour of the procedure.  0.5 mg glucagon  IV.  CONTRAST:  20 mL of Isovue 300 administered into the gastric lumen.  ANESTHESIA/SEDATION: Moderate (conscious) sedation was employed during this procedure. A total of Versed  2 mg and Fentanyl  100 mcg was administered intravenously.  Moderate Sedation Time: 14 minutes. The patient's level of consciousness and vital signs were monitored continuously by radiology nursing  throughout the procedure under my direct supervision.  FLUOROSCOPY: Radiation Exposure Index and estimated peak skin dose (PSD);  Reference air kerma (RAK), 2 mGy.  COMPLICATIONS: None immediate.  PROCEDURE: Informed written consent was obtained from the patient and/or patient's representative following explanation of the procedure, risks, benefits and alternatives. A time out was performed prior to the initiation of the procedure. Maximal barrier sterile technique utilized including caps, mask, sterile gowns, sterile gloves, large sterile drape, hand hygiene and sterile prep.  The LEFT costal margin and barium opacified transverse colon were identified and avoided. Air was injected into the stomach for insufflation and visualization under fluoroscopy. Under sterile conditions and local anesthesia, 2 T tacks were utilized to pexy the anterior aspect of the stomach against the ventral abdominal wall. Contrast injection confirmed appropriate positioning of each of the T tacks. An incision was made between the T tacks and a 17 gauge trocar needle was utilized to access the stomach. Needle position was confirmed within the stomach with aspiration of air and injection of a small amount of contrast. A stiff guidewire was advanced into the gastric lumen and under intermittent fluoroscopic guidance, the access needle was exchanged for a telescoping peel-away sheath, ultimately allowing placement of a 16 Fr balloon retention gastrostomy tube. The retention balloon was insufflated with a mixture of dilute saline and contrast and pulled taut against the anterior wall of the stomach. The external disc was cinched. Contrast injection confirms positioning within the stomach. Several spot radiographic images were obtained in various obliquities for documentation. The patient tolerated procedure well without immediate post procedural complication.  FINDINGS: After successful fluoroscopic  guided placement, the gastrostomy tube is  appropriately positioned with internal retention balloon against the ventral aspect of the gastric lumen.  IMPRESSION: Successful fluoroscopic insertion of a 16 Fr balloon retention gastrostomy tube.  The gastrostomy may be used immediately for medication administration and in 4 hrs for the initiation of feeds.  RECOMMENDATIONS: The patient will return to [facility] ([facility]) for routine feeding tube evaluation and exchange in 6 months.  Thom Hall, MD  [facility]  [facility]  Electronically Signed   By: Thom Hall M.D.   On: 11/02/2024 09:05 IR IMAGING GUIDED PORT INSERTION INDICATION: Chemotherapy.  History of head and neck cancer.  EXAM: IMPLANTED PORT A CATH PLACEMENT WITH ULTRASOUND AND FLUOROSCOPIC GUIDANCE  MEDICATIONS: None  ANESTHESIA/SEDATION: Moderate (conscious) sedation was employed during this procedure. A total of Versed  2 mg and Fentanyl  100 mcg was administered intravenously.  Moderate Sedation Time: 20 minutes. The patient's level of consciousness and vital signs were monitored continuously by radiology nursing throughout the procedure under my direct supervision.  FLUOROSCOPY: Radiation Exposure Index and estimated peak skin dose (PSD);  Reference air kerma (RAK), 0.2 mGy.  COMPLICATIONS: None immediate.  PROCEDURE: The procedure, risks, benefits, and alternatives were explained to the patient. Questions regarding the procedure were encouraged and answered. The patient understands and consents to the procedure.  The RIGHT neck and chest were prepped with chlorhexidine  in a sterile fashion, and a sterile drape was applied covering the operative field. Maximum barrier sterile technique with sterile gowns and gloves were used for the procedure. A timeout was performed prior to the initiation of the procedure. Local  anesthesia was provided with 1% lidocaine  with epinephrine .  After creating a small venotomy incision, a micropuncture kit was utilized to access the internal jugular vein under direct, real-time ultrasound guidance. Ultrasound image documentation was performed. The microwire was kinked to measure appropriate catheter length.  A subcutaneous port pocket was then created along the upper chest wall utilizing a combination of sharp and blunt dissection. The pocket was irrigated with sterile saline. A single lumen power injectable port was chosen for placement. The 8 Fr catheter was tunneled from the port pocket site to the venotomy incision. The port was placed in the pocket. The external catheter was trimmed to appropriate length. At the venotomy, an 8 Fr peel-away sheath was placed over a guidewire under fluoroscopic guidance. The catheter was then placed through the sheath and the sheath was removed. Final catheter positioning was confirmed and documented with a fluoroscopic spot radiograph. The port was accessed with a Huber needle, aspirated and flushed with heparinized saline.  The port pocket incision was closed with interrupted 3-0 Vicryl suture then Dermabond was applied, including at the venotomy incision. Dressings were placed. The patient tolerated the procedure well without immediate post procedural complication.  IMPRESSION: Successful placement of a RIGHT internal jugular approach power injectable Port-A-Cath.  The tip of the catheter is positioned within the proximal RIGHT atrium. The catheter is ready for immediate use.  Thom Hall, MD  [facility]  [facility] Radiology  Electronically Signed   By: Thom Hall M.D.   On: 11/02/2024 09:03    CODE STATUS:  Code Status History     Date Active Date Inactive Code Status Order ID Comments User Context   08/21/2024 1658 08/22/2024 1914 Full Code [id number]  Jesus Oliphant,  MD Inpatient   07/06/2024 1129 07/13/2024 2046 Full Code [id number]  Dennise Lavada POUR, MD ED   06/17/2024 1540 06/18/2024 1625  Full Code [id number]  Jesus Oliphant, MD Inpatient   12/21/2011 1452 12/24/2011 1530 Full Code [id number]  Ramonita Consuelo PARAS, RN Inpatient    Questions for Most Recent Historical Code Status (Order [id number])     Question Answer   By: Other            No orders of the defined types were placed in this encounter.    Future Appointments  Date Time Provider Department [place]  11/18/2024 10:45 AM [facility] LINAC 4 [facility] None  11/18/2024 11:00 AM LINAC-SQUIRE [facility] None  11/18/2024 12:00 PM [facility] INFUSION [facility] None  11/19/2024 10:45 AM [facility] LINAC 4 [facility] None  11/20/2024 10:45 AM [facility] LINAC 4 [facility] None  11/21/2024 10:45 AM [facility] LINAC 4 [facility] None  11/21/2024 11:15 AM Schinke, Carl B, CCC-SLP [facility]  11/22/2024  9:15 AM [facility] FLUSH [facility] None  11/22/2024  9:45 AM Rowin Bayron, Chinita, MD [facility] None  11/22/2024 10:45 AM [facility] LINAC 4 [facility] None  11/22/2024 11:15 AM Ivonne Harlene RAMAN, RD [facility] None  11/25/2024 10:45 AM [facility] LINAC 4 [facility] None  11/25/2024 12:00 PM [facility] INFUSION [facility] None  11/26/2024 10:45 AM [facility] LINAC 4 [facility] None  11/27/2024 10:45 AM [facility] LINAC 3 [facility] None  11/28/2024 10:45 AM [facility] LINAC 4 [facility] None  11/29/2024  9:15 AM [facility] FLUSH [facility] None  11/29/2024  9:45 AM Saree Krogh, MD [facility] None  11/29/2024 10:45 AM [facility] LINAC 3 [facility] None  11/29/2024 11:15 AM Ivonne Harlene RAMAN, RD [facility] None  12/02/2024 10:45 AM [facility] LINAC 3 [facility] None  12/02/2024  1:00 PM [facility] 9 [facility] H&V  12/02/2024  2:00 PM [facility] 9 [facility] H&V  12/02/2024  2:30 PM [facility] PA [facility] H&V  12/03/2024 10:45 AM [facility] LINAC 4 [facility] None  12/03/2024 11:45 AM  [facility] INFUSION [facility] None  12/04/2024 10:30 AM [facility] LINAC 4 [facility] None  12/05/2024 10:30 AM [facility] LINAC 4 [facility] None  12/06/2024 10:30 AM [facility] LINAC 4 [facility] None  12/06/2024 11:15 AM Ivonne Harlene RAMAN, RD [facility] None  12/09/2024 10:30 AM [facility] LINAC 4 [facility] None  12/10/2024 10:30 AM [facility] LINAC 4 [facility] None  12/11/2024 10:30 AM [facility] LINAC 4 [facility] None  12/12/2024 10:30 AM [facility] LINAC 4 [facility] None  12/13/2024 10:30 AM [facility] LINAC 4 [facility] None  12/13/2024 11:15 AM Ivonne Harlene RAMAN, RD [facility] None  12/16/2024 10:30 AM [facility] LINAC 4 [facility] None  12/20/2024 11:15 AM Ivonne Harlene RAMAN, RD [facility] None  12/31/2024  3:10 PM Monge, Damien BROCKS, NP [facility] H&V  04/02/2025  2:00 PM Pleas, Dipti, MD [facility] [address]  05/02/2025 11:30 AM [facility] ANNUAL WELLNESS VISIT 1 [facility] [address]      This document was completed utilizing speech recognition software. Grammatical errors, random word insertions, pronoun errors, and incomplete sentences are an occasional consequence of this system due to software limitations, ambient noise, and hardware issues. Any formal questions or concerns about the content, text or information contained within the body of this dictation should be directly addressed to the provider for clarification.   "

## 2024-11-18 ENCOUNTER — Inpatient Hospital Stay

## 2024-11-18 ENCOUNTER — Other Ambulatory Visit: Payer: Self-pay

## 2024-11-18 ENCOUNTER — Ambulatory Visit (HOSPITAL_COMMUNITY)

## 2024-11-18 ENCOUNTER — Other Ambulatory Visit: Payer: Self-pay | Admitting: Oncology

## 2024-11-18 ENCOUNTER — Ambulatory Visit: Admission: RE | Admit: 2024-11-18 | Discharge: 2024-11-18 | Attending: Radiation Oncology

## 2024-11-18 ENCOUNTER — Ambulatory Visit
Admission: RE | Admit: 2024-11-18 | Discharge: 2024-11-18 | Disposition: A | Source: Ambulatory Visit | Attending: Radiation Oncology

## 2024-11-18 ENCOUNTER — Other Ambulatory Visit (HOSPITAL_COMMUNITY): Payer: Self-pay

## 2024-11-18 VITALS — BP 128/69 | HR 75 | Temp 97.6°F | Resp 18 | Ht 60.0 in | Wt 127.5 lb

## 2024-11-18 DIAGNOSIS — C023 Malignant neoplasm of anterior two-thirds of tongue, part unspecified: Secondary | ICD-10-CM

## 2024-11-18 DIAGNOSIS — G893 Neoplasm related pain (acute) (chronic): Secondary | ICD-10-CM

## 2024-11-18 LAB — RAD ONC ARIA SESSION SUMMARY
Course Elapsed Days: 14
Plan Fractions Treated to Date: 10
Plan Prescribed Dose Per Fraction: 2.2 Gy
Plan Total Fractions Prescribed: 30
Plan Total Prescribed Dose: 66 Gy
Reference Point Dosage Given to Date: 22 Gy
Reference Point Session Dosage Given: 2.2 Gy
Session Number: 10

## 2024-11-18 MED ORDER — NYSTATIN 100000 UNIT/ML MT SUSP
5.0000 mL | Freq: Four times a day (QID) | OROMUCOSAL | 4 refills | Status: AC | PRN
  Filled 2024-11-18: qty 250, 13d supply, fill #0

## 2024-11-18 MED ORDER — FAMOTIDINE IN NACL 20-0.9 MG/50ML-% IV SOLN
20.0000 mg | Freq: Once | INTRAVENOUS | Status: AC
  Administered 2024-11-18: 20 mg via INTRAVENOUS
  Filled 2024-11-18: qty 50

## 2024-11-18 MED ORDER — DEXAMETHASONE SOD PHOSPHATE PF 10 MG/ML IJ SOLN
10.0000 mg | Freq: Once | INTRAMUSCULAR | Status: AC
  Administered 2024-11-18: 10 mg via INTRAVENOUS
  Filled 2024-11-18: qty 1

## 2024-11-18 MED ORDER — SODIUM CHLORIDE 0.9 % IV SOLN
45.0000 mg/m2 | Freq: Once | INTRAVENOUS | Status: AC
  Administered 2024-11-18: 72 mg via INTRAVENOUS
  Filled 2024-11-18: qty 12

## 2024-11-18 MED ORDER — SODIUM CHLORIDE 0.9 % IV SOLN
92.7000 mg | Freq: Once | INTRAVENOUS | Status: AC
  Administered 2024-11-18: 90 mg via INTRAVENOUS
  Filled 2024-11-18: qty 9

## 2024-11-18 MED ORDER — SODIUM CHLORIDE 0.9 % IV SOLN: INTRAVENOUS | Status: DC

## 2024-11-18 MED ORDER — OXYCODONE HCL 5 MG PO TABS: 5.0000 mg | ORAL_TABLET | Freq: Four times a day (QID) | ORAL | 0 refills | Status: DC | PRN

## 2024-11-18 MED ORDER — PALONOSETRON HCL INJECTION 0.25 MG/5ML
0.2500 mg | Freq: Once | INTRAVENOUS | Status: AC
  Administered 2024-11-18: 0.25 mg via INTRAVENOUS
  Filled 2024-11-18: qty 5

## 2024-11-18 MED ORDER — DIPHENHYDRAMINE HCL 50 MG/ML IJ SOLN
25.0000 mg | Freq: Once | INTRAMUSCULAR | Status: AC
  Administered 2024-11-18: 25 mg via INTRAVENOUS
  Filled 2024-11-18: qty 1

## 2024-11-18 NOTE — Patient Instructions (Signed)
 [facility] - A DEPT OF [place]  Discharge Instructions: Thank you for choosing [facility] to provide your oncology and hematology care.   If you have a lab appointment with the Cancer Center, please go directly to the Cancer Center and check in at the registration area.   Wear comfortable clothing and clothing appropriate for easy access to any Portacath or PICC line.   We strive to give you quality time with your provider. You may need to reschedule your appointment if you arrive late (15 or more minutes).  Arriving late affects you and other patients whose appointments are after yours.  Also, if you miss three or more appointments without notifying the office, you may be dismissed from the clinic at the providers discretion.      For prescription refill requests, have your pharmacy contact our office and allow 72 hours for refills to be completed.    Today you received the following chemotherapy and/or immunotherapy agents:  CARBOplatin (PARAPLATIN)  PACLitaxel (TAXOL)    To help prevent nausea and vomiting after your treatment, we encourage you to take your nausea medication as directed.  BELOW ARE SYMPTOMS THAT SHOULD BE REPORTED IMMEDIATELY: *FEVER GREATER THAN 100.4 F (38 C) OR HIGHER *CHILLS OR SWEATING *NAUSEA AND VOMITING THAT IS NOT CONTROLLED WITH YOUR NAUSEA MEDICATION *UNUSUAL SHORTNESS OF BREATH *UNUSUAL BRUISING OR BLEEDING *URINARY PROBLEMS (pain or burning when urinating, or frequent urination) *BOWEL PROBLEMS (unusual diarrhea, constipation, pain near the anus) TENDERNESS IN MOUTH AND THROAT WITH OR WITHOUT PRESENCE OF ULCERS (sore throat, sores in mouth, or a toothache) UNUSUAL RASH, SWELLING OR PAIN  UNUSUAL VAGINAL DISCHARGE OR ITCHING   Items with * indicate a potential emergency and should be followed up as soon as possible or go to the Emergency Department if any problems should occur.  Please show the  CHEMOTHERAPY ALERT CARD or IMMUNOTHERAPY ALERT CARD at check-in to the Emergency Department and triage nurse.  Should you have questions after your visit or need to cancel or reschedule your appointment, please contact [facility] - A DEPT OF [facility]  Dept: [phone]  and follow the prompts.  Office hours are 8:00 a.m. to 4:30 p.m. Monday - Friday. Please note that voicemails left after 4:00 p.m. may not be returned until the following business day.  We are closed weekends and major holidays. You have access to a nurse at all times for urgent questions. Please call the main number to the clinic Dept: [phone] and follow the prompts.   For any non-urgent questions, you may also contact your provider using MyChart. We now offer e-Visits for anyone 41 and older to request care online for non-urgent symptoms. For details visit [email or web address].   Also download the MyChart app! Go to the app store, search MyChart, open the app, select [facility], and log in with your MyChart username and password.

## 2024-11-18 NOTE — Progress Notes (Signed)
 Nutrition Follow-up:  Patient with SCC of tongue.  Patient receiving concurrent chemotherapy (carboplatin  and taxol ) and radiation. Followed by Dr Autumn and Izell.   Add on today from nursing.  Patient reports diarrhea with tube feeding.  Met with patient during infusion.  Patient has been giving 1 carton of osmolite 1.5 via feeding tube since 11/05/24 without reports of diarrhea.  Says that she had watery diarrhea early Saturday 2-3am 1/10.  Gave tube feeding between 6:30-7pm on Friday evening and has been giving feeding at this time of day.  Reports that stool had gotten looser for few days prior to watery episode.  Has not been taking stool softners, miralax .  Has been eating some foods by mouth, ice cream, cornbread and milk, however oral intake has decreased.  Had 2-3 more episodes of diarrhea during the day on Saturday and took imodium x3 doses.  Has not given any tube feeding since Friday night.  No diarrhea.    Medications: reviewed  Labs: reviewed  Anthropometrics:   Weight 127 lb 8 oz on 1/12 132 lb 12 oz on 1/5 131 lb on 12/29 148 lb on 06/13/24   Estimated Energy Needs  Kcals: 1500-1700 Protein: 70-88 g Fluid: > 1.5 L  NUTRITION DIAGNOSIS: Inadequate oral intake continues    INTERVENTION:  Question if tube feeding formula is causing diarrhea as has been tolerating for > 1 week before diarrhea started.  Regardless provided patient with samples of Mallie Pinion 1.4 today to try over the next few days to see if she tolerates better.   If she tolerates Mallie Pinion 1.4 would needs 4 cartons daily via bolus. Flush with 60 ml of water before and after each feeding. Plus additional 240 ml water flush for additional hydration Provides 1820 calories, 80 g protein and 936 ml free water (plus 1656 ml total with flush) Encouraged patient to start infusing 2-3 cartons of [employer] 1.4 via feeding tube with weight loss and decreased oral intake.  Written instructions provided Patient will  need to be set up with DME to provide enteral formula and supplies pending tolerance to formula    MONITORING, EVALUATION, GOAL: weight trends, intake, tube feeding   NEXT VISIT: Friday, Jan 16 with Elvie Reminded patient of this appointment  Flynn Lininger B. Dasie SOLON, CSO, LDN Registered Dietitian [phone]

## 2024-11-19 ENCOUNTER — Inpatient Hospital Stay

## 2024-11-19 ENCOUNTER — Telehealth: Payer: Self-pay

## 2024-11-19 ENCOUNTER — Emergency Department (HOSPITAL_COMMUNITY)
Admission: EM | Admit: 2024-11-19 | Discharge: 2024-11-19 | Disposition: A | Discharge status: Home / Self Care | Source: Home / Self Care | Attending: Emergency Medicine | Admitting: Emergency Medicine

## 2024-11-19 ENCOUNTER — Other Ambulatory Visit: Payer: Self-pay

## 2024-11-19 ENCOUNTER — Ambulatory Visit
Admission: RE | Admit: 2024-11-19 | Discharge: 2024-11-19 | Disposition: A | Source: Ambulatory Visit | Attending: Radiation Oncology

## 2024-11-19 DIAGNOSIS — N183 Chronic kidney disease, stage 3 unspecified: Secondary | ICD-10-CM | POA: Insufficient documentation

## 2024-11-19 DIAGNOSIS — Z79899 Other long term (current) drug therapy: Secondary | ICD-10-CM | POA: Insufficient documentation

## 2024-11-19 DIAGNOSIS — C760 Malignant neoplasm of head, face and neck: Secondary | ICD-10-CM | POA: Insufficient documentation

## 2024-11-19 DIAGNOSIS — K9423 Gastrostomy malfunction: Secondary | ICD-10-CM | POA: Insufficient documentation

## 2024-11-19 DIAGNOSIS — I129 Hypertensive chronic kidney disease with stage 1 through stage 4 chronic kidney disease, or unspecified chronic kidney disease: Secondary | ICD-10-CM | POA: Insufficient documentation

## 2024-11-19 DIAGNOSIS — Z7901 Long term (current) use of anticoagulants: Secondary | ICD-10-CM | POA: Insufficient documentation

## 2024-11-19 LAB — RAD ONC ARIA SESSION SUMMARY
Course Elapsed Days: 15
Plan Fractions Treated to Date: 11
Plan Prescribed Dose Per Fraction: 2.2 Gy
Plan Total Fractions Prescribed: 30
Plan Total Prescribed Dose: 66 Gy
Reference Point Dosage Given to Date: 24.2 Gy
Reference Point Session Dosage Given: 2.2 Gy
Session Number: 11

## 2024-11-19 NOTE — Discharge Instructions (Addendum)
 It was a pleasure meeting with you today. Your G-tube appears to still be in place as the balloon was still seated and the tube was able to be flushed without issue. Continue to follow-up with oncology team and primary care. Please return to ED if you continue to have issues.

## 2024-11-19 NOTE — ED Triage Notes (Signed)
 Patient feeding tube is coming out, dislodged today, has had it for 2-3 weeks. Patient is alert and oriented x 4. Airway patent, respirations even and unlabored. Skin normal, warm and dry. P

## 2024-11-19 NOTE — Telephone Encounter (Signed)
 Followed up with patient to provide her with her next injection appointments. Patient reported her G-Tube has come out. Patient informed to cover open site with gauze or packing material and to seek treatment at the Emergency Department. Also spoke with patient's son who agreed to take her to the [facility] Emergency Department this early evening. Dr. Autumn made aware.

## 2024-11-19 NOTE — ED Provider Notes (Signed)
 " [place] EMERGENCY DEPARTMENT AT [facility] Provider Note   CSN: [id number] Arrival date & time: 11/19/24  1715     Patient presents with: feeding tube problem   Taylor Bush is a 79 y.o. female.  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  Currently undergoing chemotherapy and radiation therapy. 11/01/2024 most recent imaging IR Gastrostomy Tube placement confirmation.   Patient is here for evaluation of her feeding tube.  Earlier today she noticed the gauze underneath her feeding tube was wet and then noted that her feeding tube hub had become dislodged. Denies trauma or tube having been caught on anything. Denies the tube fully coming out or the balloon coming out. She called nursing staff at the cancer center who recommended she come here for further evaluation.  She denies any ongoing issues with G-tube. States current tube has been in place about 2-3 weeks. She was able to flush fluids with ease earlier in the day. Denies concerns for infection. Believes tube became unseated due to recent weight loss (about 4 lbs in the past week).  The history is provided by the patient.       Prior to Admission medications  Medication Sig Start Date End Date Taking? Authorizing Provider  amLODipine  (NORVASC ) 10 MG tablet TAKE 1 TABLET BY MOUTH DAILY 11/08/24   Copland, Jacques, MD  apixaban  (ELIQUIS ) 5 MG TABS tablet Take 1 tablet (5 mg total) by mouth 2 (two) times daily. 08/24/24   Henry Manuelita NOVAK, NP  atorvastatin  (LIPITOR ) 80 MG tablet Take 1 tablet (80 mg total) by mouth daily. Patient not taking: Reported on 11/15/2024 07/14/24   Akula, Vijaya, MD  clopidogrel  (PLAVIX ) 75 MG tablet Take 1 tablet (75 mg total) by mouth daily with breakfast. Patient not taking: Reported on 11/15/2024  07/14/24   Akula, Vijaya, MD  Faricimab -svoa (VABYSMO  IZ) 1 Dose by Intravitreal route every 6 (six) weeks.    [provider]  lidocaine  (XYLOCAINE ) 2 % solution Patient: Mix 1 part 2% viscous lidocaine , 1 part water . Swish & swallow 10mL of diluted mixture 30 minutes before meals and at bedtime, up to 4 times daily 11/11/24   Izell Domino, MD  lidocaine -prilocaine  (EMLA ) cream Apply to affected area once Patient not taking: Reported on 11/15/2024 10/28/24   Pasam, Chinita, MD  lisinopril  (ZESTRIL ) 20 MG tablet TAKE 1 TABLET BY MOUTH DAILY 11/08/24   Copland, Jacques, MD  magic mouthwash (nystatin , lidocaine , diphenhydrAMINE , alum & mag hydroxide) suspension Take 5 mLs by mouth 4 (four) times daily as needed for mouth pain. Suspension contains equal amounts of Maalox Extra Strength, nystatin , diphenhydramine  and lidocaine . 1:1:1:1 ratio please. 11/18/24   Pasam, Chinita, MD  metoprolol  (TOPROL -XL) 200 MG 24 hr tablet TAKE 1 TABLET BY MOUTH DAILY 11/08/24   Copland, Jacques, MD  ondansetron  (ZOFRAN ) 8 MG tablet Take 1 tablet (8 mg total) by mouth every 8 (eight) hours as needed for nausea or vomiting. Start on the third day after chemotherapy. Patient not taking: Reported on 11/15/2024 10/18/24   Pasam, Chinita, MD  ondansetron  (ZOFRAN -ODT) 4 MG disintegrating tablet Take 1 tablet (4 mg total) by mouth every 8 (eight) hours as needed for nausea or vomiting. Patient not taking: Reported on 11/15/2024 06/18/24   Jesus Oliphant, MD  oxyCODONE  (OXY IR/ROXICODONE ) 5 MG immediate release tablet Take 1 tablet (5  mg total) by mouth every 6 (six) hours as needed for severe pain (pain score 7-10). 11/18/24   Pasam, Chinita, MD  prochlorperazine  (COMPAZINE ) 10 MG tablet Take 1 tablet (10 mg total) by mouth every 6 (six) hours as needed for nausea or vomiting. Patient not taking: Reported on 11/15/2024 10/18/24   Pasam, Chinita, MD  sodium fluoride (FLUORISHIELD) 1.1 % GEL dental gel Apply thin layer of fluoride gel inside  the trays and wear them for 20 minutes nightly. Start after completion of HNRT Patient not taking: Reported on 11/15/2024 09/23/24   [provider]  traMADol  (ULTRAM ) 50 MG tablet Take 1 tablet (50 mg total) by mouth every 8 (eight) hours as needed. 08/24/24   Copland, Jacques, MD    Allergies: Statins    Review of Systems  Updated Vital Signs BP (!) 140/78 (BP Location: Left Arm)   Pulse 78   Temp 98.3 F (36.8 C) (Oral)   Resp 16   SpO2 100%   Physical Exam Vitals and nursing note reviewed.  Constitutional:      General: She is not in acute distress.    Appearance: Normal appearance. She is not ill-appearing, toxic-appearing or diaphoretic.     Comments: Dried gastric contents on patient's pants. Towels in place below G-tube with small amount of gastric content.  HENT:     Head: Normocephalic and atraumatic.  Eyes:     General: No scleral icterus.    Extraocular Movements: Extraocular movements intact.     Conjunctiva/sclera: Conjunctivae normal.  Pulmonary:     Effort: Pulmonary effort is normal. No respiratory distress.  Abdominal:     Comments: G-tube in place. No signs of infection. Small amount of tubing visible between skin and hub (circular piece). Balloon not visible and tube is seated in place with inflated balloon. Hub able to be slid to be flush with skin.  Musculoskeletal:        General: Normal range of motion.  Skin:    General: Skin is warm and dry.     Coloration: Skin is not jaundiced or pale.  Neurological:     Mental Status: She is alert and oriented to person, place, and time.     (all labs ordered are listed, but only abnormal results are displayed) Labs Reviewed - No data to display  EKG: None  Radiology: No results found.   Procedures   Medications Ordered in the ED - No data to display   Patient presents to the ED for concern of feeding tube issue, this involves an extensive number of treatment options, and is a complaint  that carries with it a high risk of complications and morbidity.  The differential diagnosis includes feeding tube dislodgement.   Additional history obtained:  Additional history obtained from Outside Medical Records   External records from outside source obtained and reviewed including previous imaging and recent visits for ongoing cancer treatment.   Problem List / ED Course:     G-tube issue. G-tube balloon appears to be seated in place. G-tube hub moved to be flush with skin. Tube able to flush and pull back without issue or discomfort. I do not feel imaging is necessary at this time to confirm placement as tube is working effectively and balloon intact. Nursing staff will secure tube. Encouraged patient to return if she has further issues and patient verbalized understanding. Stable for discharge.   Reevaluation:  After the interventions noted above, I reevaluated the patient and found that they have :  improved   Dispostion:  After consideration of the diagnostic results and the patients response to treatment, I feel that the patent would benefit from supportive care in the home setting and continued follow-up with oncology team. Return precautions given.                                   Medical Decision Making  This note was produced using Dragon Medical voice recognition. While the provider has reviewed and verified all clinical information, transcription errors may remain.    Final diagnoses:  Gastrostomy tube dysfunction [facility])    ED Discharge Orders     None          Rosina Almarie LABOR, PA-C 11/19/24 1949    Pamella Ozell LABOR, DO 11/26/24 0001  "

## 2024-11-19 NOTE — ED Provider Triage Note (Signed)
 Emergency Medicine Provider Triage Evaluation Note  HOUDA BRAU , a 79 y.o. female  was evaluated in triage.  Pt complains of feeding tube dislodgment.  She states it fell out spontaneously today.  Has been in for 2 to 3 weeks.  Denies abdominal pain.  Has cancer of the tongue.  Review of Systems  Positive: See above Negative: See above  Physical Exam  There were no vitals taken for this visit. Gen:   Awake, no distress   Resp:  Normal effort  MSK:   Moves extremities without difficulty  Other:    Medical Decision Making  Medically screening exam initiated at 6:09 PM.  Appropriate orders placed.  FELICIDAD SUGARMAN was informed that the remainder of the evaluation will be completed by another provider, this initial triage assessment does not replace that evaluation, and the importance of remaining in the ED until their evaluation is complete.  Work up started   Lang Norleen POUR, PA-C 11/19/24 1810

## 2024-11-20 ENCOUNTER — Inpatient Hospital Stay

## 2024-11-20 ENCOUNTER — Ambulatory Visit
Admission: RE | Admit: 2024-11-20 | Discharge: 2024-11-20 | Disposition: A | Discharge status: Home / Self Care | Source: Ambulatory Visit | Attending: Radiation Oncology | Admitting: Radiation Oncology

## 2024-11-20 ENCOUNTER — Other Ambulatory Visit: Payer: Self-pay

## 2024-11-20 VITALS — BP 125/66 | HR 89 | Resp 17

## 2024-11-20 DIAGNOSIS — D701 Agranulocytosis secondary to cancer chemotherapy: Secondary | ICD-10-CM

## 2024-11-20 LAB — RAD ONC ARIA SESSION SUMMARY
Course Elapsed Days: 16
Plan Fractions Treated to Date: 12
Plan Prescribed Dose Per Fraction: 2.2 Gy
Plan Total Fractions Prescribed: 30
Plan Total Prescribed Dose: 66 Gy
Reference Point Dosage Given to Date: 26.4 Gy
Reference Point Session Dosage Given: 2.2 Gy
Session Number: 12

## 2024-11-20 MED ORDER — FILGRASTIM-SNDZ 300 MCG/0.5ML IJ SOSY
300.0000 ug | PREFILLED_SYRINGE | Freq: Every day | INTRAMUSCULAR | Status: DC
  Administered 2024-11-20: 300 ug via SUBCUTANEOUS
  Filled 2024-11-20: qty 0.5

## 2024-11-21 ENCOUNTER — Ambulatory Visit: Admitting: Physical Therapy

## 2024-11-21 ENCOUNTER — Other Ambulatory Visit: Payer: Self-pay

## 2024-11-21 ENCOUNTER — Ambulatory Visit
Admission: RE | Admit: 2024-11-21 | Discharge: 2024-11-21 | Disposition: A | Source: Ambulatory Visit | Attending: Radiation Oncology

## 2024-11-21 ENCOUNTER — Ambulatory Visit: Attending: Radiation Oncology

## 2024-11-21 ENCOUNTER — Inpatient Hospital Stay

## 2024-11-21 ENCOUNTER — Encounter: Payer: Self-pay | Admitting: Physical Therapy

## 2024-11-21 VITALS — BP 111/63 | HR 84 | Temp 98.5°F | Resp 16

## 2024-11-21 DIAGNOSIS — C023 Malignant neoplasm of anterior two-thirds of tongue, part unspecified: Secondary | ICD-10-CM | POA: Diagnosis present

## 2024-11-21 DIAGNOSIS — R293 Abnormal posture: Secondary | ICD-10-CM | POA: Insufficient documentation

## 2024-11-21 DIAGNOSIS — D701 Agranulocytosis secondary to cancer chemotherapy: Secondary | ICD-10-CM

## 2024-11-21 DIAGNOSIS — C029 Malignant neoplasm of tongue, unspecified: Secondary | ICD-10-CM

## 2024-11-21 DIAGNOSIS — R1310 Dysphagia, unspecified: Secondary | ICD-10-CM | POA: Insufficient documentation

## 2024-11-21 LAB — RAD ONC ARIA SESSION SUMMARY
Course Elapsed Days: 17
Plan Fractions Treated to Date: 13
Plan Prescribed Dose Per Fraction: 2.2 Gy
Plan Total Fractions Prescribed: 30
Plan Total Prescribed Dose: 66 Gy
Reference Point Dosage Given to Date: 28.6 Gy
Reference Point Session Dosage Given: 2.2 Gy
Session Number: 13

## 2024-11-21 MED ORDER — FILGRASTIM-SNDZ 300 MCG/0.5ML IJ SOSY
300.0000 ug | PREFILLED_SYRINGE | Freq: Once | INTRAMUSCULAR | Status: AC
  Administered 2024-11-21: 300 ug via SUBCUTANEOUS

## 2024-11-21 NOTE — Therapy (Signed)
 " OUTPATIENT SPEECH LANGUAGE PATHOLOGY ONCOLOGY EVALUATION   Patient Name: Taylor Bush MRN: [id number] DOB:[birth year], 79 y.o., female Today's Date: 11/21/2024  PCP: Watt Mirza, MD REFERRING PROVIDER: Izell Domino, MD  END OF SESSION:  End of Session - 11/21/24 2331     Visit Number 1    Number of Visits 4    Date for Recertification  02/19/25    SLP Start Time 1121    SLP Stop Time  1159    SLP Time Calculation (min) 38 min    Activity Tolerance Patient tolerated treatment well          Past Medical History:  Diagnosis Date   Allergic rhinitis    Anemia in chronic kidney disease (CKD)    B12 deficiency 09/13/2021   Blood transfusion 2012   and 2013   Chronic low back pain    CKD (chronic kidney disease) stage 3, GFR 30-59 ml/min (HCC) 11/29/2013   HLD (hyperlipidemia)    HTN (hypertension)    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) 09/20/2024   Open-angle glaucoma    PAD (peripheral artery disease)    with intermittent claudication, ABI 0.72 on Rt and 0.73 on L when assessed in 2010.    Paroxysmal atrial fibrillation (HCC) 08/21/2024   Past Surgical History:  Procedure Laterality Date   ABDOMINAL AORTOGRAM W/LOWER EXTREMITY N/A 07/09/2024   Procedure: ABDOMINAL AORTOGRAM W/LOWER EXTREMITY;  Surgeon: Serene Gaile ORN, MD;  Location: [facility];  Service: Cardiovascular;  Laterality: N/A;   abnormal vascular studies  05/07/2009   ABI's 70, R SFA > 50%^ blockage    APPLICATION OF WOUND VAC Left 07/12/2024   Procedure: APPLICATION, WOUND VAC;  Surgeon: Harden Jerona GAILS, MD;  Location: [facility];  Service: Orthopedics;  Laterality: Left;   CATARACT EXTRACTION, BILATERAL  2011   EXCISION OF TONGUE LESION WITH LASER N/A 08/31/2015   Procedure: BIOPSY AND CO2 LASER OF TONGUE LESION ;  Surgeon: Ida Loader, MD;  Location: [facility];  Service: ENT;  Laterality: N/A;   EXCISION OF TONGUE LESION WITH LASER Left 06/29/2020   Procedure:  EXCISION OF TONGUE LESION/CO2 laser w/biopsy;  Surgeon: Loader Ida, MD;  Location: [facility];  Service: ENT;  Laterality: Left;   EXCISION OF TONGUE LESION WITH LASER Bilateral 10/02/2023   Procedure: ORAL BIOPSY AND CO2 LASER ABLATATION OF TONGUE LESION;  Surgeon: Loader Ida, MD;  Location: [facility];  Service: ENT;  Laterality: Bilateral;   EXCISION ORAL LESION WITH CO2 LASER N/A 12/08/2021   Procedure: EXCISION OF ORAL ORAL CAVITY LESION WITH CO2 LASER;  Surgeon: Loader Ida, MD;  Location: [facility];  Service: ENT;  Laterality: N/A;   EYE SURGERY     GLOSSECTOMY, PARTIAL Left 06/17/2024   Procedure: GLOSSECTOMY, PARTIAL;  Surgeon: Loader Ida, MD;  Location: [facility];  Service: ENT;  Laterality: Left;   INCISION AND DRAINAGE OF WOUND Left 07/12/2024   Procedure: IRRIGATION AND DEBRIDEMENT WOUND;  Surgeon: Harden Jerona GAILS, MD;  Location: [facility];  Service: Orthopedics;  Laterality: Left;  LEFT FOOT DEBRIDEMENT   IR GASTROSTOMY TUBE MOD SED  11/01/2024   IR IMAGING GUIDED PORT INSERTION  11/01/2024   JOINT REPLACEMENT     LOWER EXTREMITY ANGIOGRAPHY  07/09/2024   Procedure: Lower Extremity Angiography;  Surgeon: Serene Gaile ORN, MD;  Location: [facility];  Service: Cardiovascular;;   LOWER EXTREMITY INTERVENTION  07/09/2024   Procedure: LOWER EXTREMITY INTERVENTION;  Surgeon: Serene Gaile ORN, MD;  Location: [facility];  Service: Cardiovascular;;   OTHER SURGICAL HISTORY     ganglion cyst removed left wrist    PERIPHERAL INTRAVASCULAR LITHOTRIPSY  07/09/2024   Procedure: PERIPHERAL INTRAVASCULAR LITHOTRIPSY;  Surgeon: Serene Gaile ORN, MD;  Location: [facility];  Service: Cardiovascular;;   PERIPHERAL VASCULAR ATHERECTOMY  07/09/2024   Procedure: PERIPHERAL VASCULAR ATHERECTOMY;  Surgeon: Serene Gaile ORN, MD;  Location: [facility];  Service: Cardiovascular;;   RADICAL NECK DISSECTION Left 08/21/2024   Procedure: DISSECTION, NECK, RADICAL;   Surgeon: Jesus Oliphant, MD;  Location: [facility] OR;  Service: ENT;  Laterality: Left;   TOTAL HIP ARTHROPLASTY  10/03/2011   Procedure: TOTAL HIP ARTHROPLASTY;  Surgeon: Dempsey LULLA Moan;  Location: [facility];  Service: Orthopedics;  Laterality: Right;   TOTAL HIP ARTHROPLASTY  12/21/2011   Procedure: TOTAL HIP ARTHROPLASTY;  Surgeon: Dempsey LULLA Moan, MD;  Location: [facility];  Service: Orthopedics;  Laterality: Left;   TUBAL LIGATION     VIDEO BRONCHOSCOPY WITH ENDOBRONCHIAL NAVIGATION Left 10/14/2024   Procedure: VIDEO BRONCHOSCOPY WITH ENDOBRONCHIAL NAVIGATION;  Surgeon: Shelah Lamar RAMAN, MD;  Location: [facility];  Service: Pulmonary;  Laterality: Left;   VIDEO BRONCHOSCOPY WITH ENDOBRONCHIAL ULTRASOUND Bilateral 10/14/2024   Procedure: BRONCHOSCOPY, WITH EBUS;  Surgeon: Shelah Lamar RAMAN, MD;  Location: [facility];  Service: Pulmonary;  Laterality: Bilateral;   Patient Active Problem List   Diagnosis Date Noted   Chemotherapy induced neutropenia 11/15/2024   Mediastinal adenopathy 10/14/2024   Lung nodule 10/07/2024   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) 09/20/2024   Paroxysmal atrial fibrillation (HCC) 08/21/2024   Peripheral arterial disease 07/12/2024   Dog bite of ankle, sequela 07/06/2024   Gangrene of left foot (HCC) 07/06/2024   B12 deficiency 09/13/2021   Allergic rhinitis    CKD (chronic kidney disease) stage 3, GFR 30-59 ml/min (HCC) 11/29/2013   Anemia of chronic disease 12/16/2011   Atherosclerosis of native artery of extremity with intermittent claudication 06/17/2009   Lumbar back pain with radiculopathy affecting left lower extremity 06/03/2009   Mixed hyperlipidemia 12/15/2008   Essential hypertension 12/15/2008   Open-angle glaucoma 10/27/2008    ONSET DATE: SEE PERTINENT HISTORY BELOW  REFERRING DIAG:  C02.3 (ICD-10-CM) - 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)      THERAPY DIAG:  Dysphagia, unspecified type  Rationale for  Evaluation and Treatment: Rehabilitation  SUBJECTIVE:   SUBJECTIVE STATEMENT: Pt states she is using PEG tube primarily due to dysgeusia.  Pt accompanied by: self  PERTINENT HISTORY:  Invasive to moderately differentiated SCC of the left tongue with cervical node involvement (left); p 16 negative, angiolymphatic invasion present. 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 On 08/14/23 she saw Dr. Jesus for evaluation of new white spots on the posterior left lateral aspect of the tongue. 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 10/02/23 biopsies done and showed moderate squamous dysplasia without evidence of carcinoma. 06/13/24 She again presented to Dr. Jesus with c/o worsening irritation to her left tongue over several months. Oral exam noted a 3 cm raised ulcerative lesion with surrounding leukoplakia to the left posterior lateral tongue. 06/17/24 Based on Dr. Godfrey recommendations she underwent a left partial glossectomy. Pathology from the procedure revealed: tumor the size of 1.9 cm; histology of invasive well to moderately differentiated squamous cell carcinoma invading a depth  of 0.9 cm, along with adjacent high-grade squamous dysplasia; p16 negative; angiolymphatic invasion present; all margins negative for dysplasia or carcinoma. No lymph nodes were excised. Based on the depth of invasion she then underwent a left modified neck dissection on 08/21/24. Pathology from the procedure revealed: 03/33 lymph nodes positive for metastatic SCC; the largest lymph node involved was a level II lymph node measuring 1.9 cm. The same large level II lymph node was also positive for focal extracapsular extension; with a distance of less than 2 mm from the lymph node capsule (microscopic extranodal extension). 09/18/24 CT neck, chest, abdomen completed. CT neck showed a prominent heterogeneous left level 3/4  cervical lymph node measuring 1.0 x 1.2 cm with central hypoattenuation concerning for possible central necrosis; subtle asymmetric soft tissue at the left posterior tongue (likely related to recent surgical changes); and postsurgical changes of the left neck with presumed surgical absence of the left submandibular gland. CT of the abdomen on 09/18/24 showed no evidence of metastatic disease in the abdomen or acute findings overall. CT chest notable for somewhat suspicious lymph nodes in the mediastinum, over 1 cm; 8 mm left upper lobe pulmonary nodule that is indeterminate.Consult with Dr. Izell and Dr. Autumn 09/20/24. Radiation with Chemotherapy is recommended. 10/01/24 to assess mediastinal adenopathy. 10/14/24 Biopsy of left upper lobe and mediastinal adenopathy by Dr. Shelah were negative for cancer.Treatment plan: She will receive 30 fractions of radiation with weekly chemotherapy to her tongue and bilateral neck which started on 11/04/24 and complete 12/16/24.Pretreatment procedures:06/17/24 Left partial glossectomy, 08/21/24 Left modified neck dissection. 11/01/24 PAC/PEG placement  PAIN:  Are you having pain? No and increased to 7/10 with 5 swallows of puree and water. Soreness in the throat. Meds reduce pain.  FALLS: Has patient fallen in last 6 months?  See PT evaluation for details  LIVING ENVIRONMENT: Lives with: lives with their family Lives in: House/apartment  PLOF:  Level of assistance: Independent with ADLs, Independent with IADLs Employment: Retired  PATIENT GOALS: Maintain WNL swallowing  OBJECTIVE:  Note: Objective measures were completed at Evaluation unless otherwise noted. DIAGNOSTIC FINDINGS: See pertinent history above  INSTRUMENTAL SWALLOW STUDY FINDINGS (MBSS) none noted   COGNITION: Overall cognitive status: Within functional limits for tasks assessed  LANGUAGE: Receptive and Expressive language appeared WNL.  ORAL MOTOR EXAMINATION: Overall status: Impaired:  Lingual: Left (ROM, Symmetry, and Strength) Comments: Partial glossectomy  MOTOR SPEECH: Overall motor speech: impaired Level of impairment: Word, Phrase, Sentence, and Conversation Respiration: thoracic breathing Phonation: hoarse Resonance: WFL Articulation: Impaired: word, phrase, sentence, and conversation Intelligibility: intelligible if looking at pt, more difficult if listener is turned away  Interfering components: anatomical limitations Effective technique: over articulate; SLP modeled this for pt today.  SUBJECTIVE DYSPHAGIA REPORTS:  Date of onset: denies sx Reported symptoms: none reported  Current diet: Dysphagia 1 (puree) and thin liquids  Co-morbid voice changes: Yes  FACTORS WHICH MAY INCREASE RISK OF ADVERSE EVENT IN PRESENCE OF ASPIRATION:  General health: frail or deconditioned  Risk factors: tube present (PEG)   CLINICAL SWALLOW ASSESSMENT:   Dentition: adequate natural dentition Vocal quality at baseline: hoarse and rough Patient directly observed with POs: Yes: dysphagia 1 (puree) and thin liquids  Feeding: able to feed self Liquids provided by: cup Oral phase signs and symptoms: prolonged bolus formation Pharyngeal phase signs and symptoms: wincing due to odynophagia  TREATMENT DATE:   11/21/24: Research states the risk for dysphagia increases due to radiation and/or chemotherapy treatment due to a variety of factors, so SLP educated the pt about the possibility of reduced/limited ability for PO intake during rad tx. SLP also educated pt regarding possible changes to swallowing musculature after rad tx, and why adherence to dysphagia HEP provided today and PO consumption was necessary to inhibit muscle fibrosis following rad tx and to mitigate muscle disuse atrophy. SLP informed pt why this would be detrimental to their swallowing status  and to their pulmonary health. Pt demonstrated understanding of these things to SLP. SLP encouraged pt to safely eat and drink as deep into their radiation/chemotherapy as possible to provide the best possible long-term swallowing outcome for pt.  SLP then developed an individualized HEP for pt involving oral and pharyngeal strengthening and ROM and pt was instructed how to perform these exercises, including SLP demonstration. After SLP demonstration, pt return demonstrated each exercise. SLP ensured pt performance was correct prior to educating pt on next exercise. Pt required model and then usual min-mod cues faded to modified independent to perform HEP. Pt was instructed to complete this program at least 5 days a week, at 20-30 reps a day (except Shaker at 6 reps a day) until 6 months after his or her last day of rad tx, and then x2 a week after that, indefinitely. Among other modifications for days when pt cannot functionally swallow, SLP also suggested pt to perform only non-swallowing tasks on the handout/HEP, and if necessary to cycle through the swallowing portion so the full program of exercises can be completed instead of fatiguing on one of the swallowing exercises and being unable to perform the other swallowing exercises. SLP instructed that swallowing exercises should then be added back into the regimen as pt is able to do so.   PATIENT EDUCATION: Education details: late effects head/neck radiation on swallow function, HEP procedure, and modification to HEP when difficulty experienced with swallowing during and after radiation course Person educated: Patient Education method: Explanation, Demonstration, Verbal cues, and Handouts Education comprehension: verbalized understanding, returned demonstration, verbal cues required, and needs further education   ASSESSMENT:  CLINICAL IMPRESSION: Patient is a 79 y.o. F who was seen today for assessment of swallowing as they undergo  radiation/chemoradiation therapy. Today pt ate applesauce and drank thin liquids without overt s/s oral or pharyngeal difficulty. At this time pt swallowing is deemed WNL/WFL with these POs. There are no overt s/s aspiration PNA observed by SLP nor any reported by pt at this time. Data indicate that pt's swallow ability will likely decrease over the course of radiation/chemoradiation therapy and could very well decline over time following the conclusion of that therapy due to muscle disuse atrophy and/or muscle fibrosis. Pt will need to be seen regularly by SLP in order to assess safety of PO intake, assess the need for any objective swallow assessment, and ensuring pt is correctly completing the individualized HEP.  OBJECTIVE IMPAIRMENTS: include dysphagia. These impairments are limiting patient from safety when swallowing. Factors affecting potential to achieve goals and functional outcome are cooperation/participation level (pt appeared distant during session today, with limited eye contact with SLP). Patient will benefit from skilled SLP services to address above impairments and improve overall function.  REHAB POTENTIAL: Good     GOALS: Goals reviewed with patient? No   SHORT TERM GOALS: Target: 3rd total session   1. Pt will complete HEP with modified independence in 2 sessions  Baseline: Goal status: INITIAL   2.  pt will tell SLP why pt is completing HEP with modified independence Baseline:  Goal status: INITIAL   3.  pt will demo understanding of at least 3 overt s/s aspiration PNA with modified independence Baseline:  Goal status: INITIAL   4.  pt will demo knowledge of how a food journal could hasten return to a more normalized diet Baseline:  Goal status: INITIAL     LONG TERM GOALS: Target: 7th total session   1.  pt will complete HEP with independence over two visits Baseline:  Goal status: INITIAL   2.  pt will describe how to modify HEP over time, and the timeline  associated with reduction in HEP frequency with modified independence over two sessions Baseline:  Goal status: INITIAL     PLAN:   SLP FREQUENCY:  once approx every 4 weeks   SLP DURATION:  7 sessions   PLANNED INTERVENTIONS: Aspiration precaution training, Pharyngeal strengthening exercises, Diet toleration management , Trials of upgraded texture/liquids, SLP instruction and feedback, Compensatory strategies, and Patient/family education, [phone] (treatment of swallowing dysfunction and/or oral function for feeding)     Aavya Shafer, CCC-SLP 11/21/2024, 11:35 PM      "

## 2024-11-21 NOTE — Patient Instructions (Signed)
 SWALLOWING EXERCISES Do these 5-6 days/week until 6 months after your last day of radiation, then 2 days per week afterwards You can use 1-2 drops of liquid to help you swallow, if your mouth gets dry  Effortful Swallows - Press your tongue against the roof of your mouth for 3 seconds, then swallow as hard as you can - Do at least 20 reps/day, in sets of 5-10  Masako Swallow - swallow with your tongue sticking out - Stick tongue out past your lips and gently bite tongue with your teeth - Swallow, while holding your tongue with your teeth - Do at least 20 reps/day, in sets of 5-10   Shaker Exercise - head lift - Lie flat on your back in your bed, the floor, or a couch  - Raise your head and look at your feet - KEEP YOUR SHOULDERS DOWN - HOLD FOR 45-60 SECONDS, then lower your head back down - Repeat 3 times, 2-3 times a day  [employer] - "squeeze swallow" exercise - Swallow, and squeeze tight to keep your Adam's Apple up - Hold the squeeze for 5-7 seconds - then relax - Do at least 20 reps/day, in sets of 5-10  5.   Chin tuck  (optional) - Place a rolled up towel (4 inches wide) under your chin near your neck - Tuck your chin and push hard on the towel for 5 seconds - Do at least 20 reps/day, in sets of 5-10

## 2024-11-21 NOTE — Patient Instructions (Signed)
 Filgrastim Injection What is this medication? FILGRASTIM (fil GRA stim) lowers the risk of infection in people who are receiving chemotherapy. It works by Systems analyst make more white blood cells, which protects your body from infection. It may also be used to help people who have been exposed to high doses of radiation. It can be used to help prepare your body before a stem cell transplant. It works by helping your bone marrow make and release stem cells into the blood. This medicine may be used for other purposes; ask your health care provider or pharmacist if you have questions. COMMON BRAND NAME(S): Neupogen, Nivestym, Nypozi, Releuko, Zarxio What should I tell my care team before I take this medication? They need to know if you have any of these conditions: History of blood diseases, such as sickle cell anemia Kidney disease Recent or ongoing radiation An unusual or allergic reaction to filgrastim, pegfilgrastim, latex, rubber, other medications, foods, dyes, or preservatives Pregnant or trying to get pregnant Breast-feeding How should I use this medication? This medication is injected under the skin or into a vein. It is usually given by your care team in a hospital or clinic setting. It may be given at home. If you get this medication at home, you will be taught how to prepare and give it. Use exactly as directed. Take it as directed on the prescription label at the same time every day. Keep taking it unless your care team tells you to stop. It is important that you put your used needles and syringes in a special sharps container. Do not put them in a trash can. If you do not have a sharps container, call your pharmacist or care team to get one. This medication comes with INSTRUCTIONS FOR USE. Ask your pharmacist for directions on how to use this medication. Read the information carefully. Talk to your pharmacist or care team if you have questions. Talk to your care team about the use of  this medication in children. While it may be prescribed for children for selected conditions, precautions do apply. Overdosage: If you think you have taken too much of this medicine contact a poison control center or emergency room at once. NOTE: This medicine is only for you. Do not share this medicine with others. What if I miss a dose? It is important not to miss any doses. Talk to your care team about what to do if you miss a dose. What may interact with this medication? Medications that may cause a release of neutrophils, such as lithium This list may not describe all possible interactions. Give your health care provider a list of all the medicines, herbs, non-prescription drugs, or dietary supplements you use. Also tell them if you smoke, drink alcohol, or use illegal drugs. Some items may interact with your medicine. What should I watch for while using this medication? Your condition will be monitored carefully while you are receiving this medication. You may need bloodwork while taking this medication. Talk to your care team about your risk of cancer. You may be more at risk for certain types of cancer if you take this medication. What side effects may I notice from receiving this medication? Side effects that you should report to your care team as soon as possible: Allergic reactions--skin rash, itching, hives, swelling of the face, lips, tongue, or throat Capillary leak syndrome--stomach or muscle pain, unusual weakness or fatigue, feeling faint or lightheaded, decrease in the amount of urine, swelling of the ankles, hands,  or feet, trouble breathing High white blood cell level--fever, fatigue, trouble breathing, night sweats, change in vision, weight loss Inflammation of the aorta--fever, fatigue, back, chest, or stomach pain, severe headache Kidney injury (glomerulonephritis)--decrease in the amount of urine, red or dark brown urine, foamy or bubbly urine, swelling of the ankles, hands,  or feet Shortness of breath or trouble breathing Spleen injury--pain in upper left stomach or shoulder Unusual bruising or bleeding Side effects that usually do not require medical attention (report to your care team if they continue or are bothersome): Back pain Bone pain Fatigue Fever Headache Nausea This list may not describe all possible side effects. Call your doctor for medical advice about side effects. You may report side effects to FDA at 1-800-FDA-1088. Where should I keep my medication? Keep out of the reach of children and pets. Keep this medication in the original packaging until you are ready to take it. Protect from light. See product for storage information. Each product may have different instructions. Get rid of any unused medication after the expiration date. To get rid of medications that are no longer needed or have expired: Take the medication to a medications take-back program. Check with your pharmacy or law enforcement to find a location. If you cannot return the medication, ask your pharmacist or care team how to get rid of this medication safely. NOTE: This sheet is a summary. It may not cover all possible information. If you have questions about this medicine, talk to your doctor, pharmacist, or health care provider.  2024 Elsevier/Gold Standard (2022-03-17 00:00:00)

## 2024-11-21 NOTE — Therapy (Signed)
 " OUTPATIENT PHYSICAL THERAPY HEAD AND NECK BASELINE EVALUATION   Patient Name: Taylor Bush MRN: [id number] DOB:Dec 07, 1945, 79 y.o., female Today's Date: 11/21/2024  END OF SESSION:  PT End of Session - 11/21/24 1028     Visit Number 1    Number of Visits 2    Date for Recertification  02/13/25    PT Start Time 1000    PT Stop Time 1022    PT Time Calculation (min) 22 min    Activity Tolerance Patient tolerated treatment well    Behavior During Therapy [facility] for tasks assessed/performed          Past Medical History:  Diagnosis Date   Allergic rhinitis    Anemia in chronic kidney disease (CKD)    B12 deficiency 09/13/2021   Blood transfusion 2012   and 2013   Chronic low back pain    CKD (chronic kidney disease) stage 3, GFR 30-59 ml/min (HCC) 11/29/2013   HLD (hyperlipidemia)    HTN (hypertension)    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) 09/20/2024   Open-angle glaucoma    PAD (peripheral artery disease)    with intermittent claudication, ABI 0.72 on Rt and 0.73 on L when assessed in 2010.    Paroxysmal atrial fibrillation (HCC) 08/21/2024   Past Surgical History:  Procedure Laterality Date   ABDOMINAL AORTOGRAM W/LOWER EXTREMITY N/A 07/09/2024   Procedure: ABDOMINAL AORTOGRAM W/LOWER EXTREMITY;  Surgeon: Serene Gaile ORN, MD;  [facility];  Service: Cardiovascular;  Laterality: N/A;   abnormal vascular studies  05/07/2009   ABI's 70, R SFA > 50%^ blockage    APPLICATION OF WOUND VAC Left 07/12/2024   Procedure: APPLICATION, WOUND VAC;  Surgeon: Harden Jerona GAILS, MD;  Location: [facility];  Service: Orthopedics;  Laterality: Left;   CATARACT EXTRACTION, BILATERAL  2011   EXCISION OF TONGUE LESION WITH LASER N/A 08/31/2015   Procedure: BIOPSY AND CO2 LASER OF TONGUE LESION ;  Surgeon: Ida Loader, MD;  Location: [facility];  Service: ENT;  Laterality: N/A;   EXCISION OF TONGUE LESION WITH LASER Left 06/29/2020   Procedure:  EXCISION OF TONGUE LESION/CO2 laser w/biopsy;  Surgeon: Loader Ida, MD;  Location: [facility];  Service: ENT;  Laterality: Left;   EXCISION OF TONGUE LESION WITH LASER Bilateral 10/02/2023   Procedure: ORAL BIOPSY AND CO2 LASER ABLATATION OF TONGUE LESION;  Surgeon: Loader Ida, MD;  Location: [facility];  Service: ENT;  Laterality: Bilateral;   EXCISION ORAL LESION WITH CO2 LASER N/A 12/08/2021   Procedure: EXCISION OF ORAL ORAL CAVITY LESION WITH CO2 LASER;  Surgeon: Loader Ida, MD;  Location: [facility];  Service: ENT;  Laterality: N/A;   EYE SURGERY     GLOSSECTOMY, PARTIAL Left 06/17/2024   Procedure: GLOSSECTOMY, PARTIAL;  Surgeon: Loader Ida, MD;  Location: [facility] OR;  Service: ENT;  Laterality: Left;   INCISION AND DRAINAGE OF WOUND Left 07/12/2024   Procedure: IRRIGATION AND DEBRIDEMENT WOUND;  Surgeon: Harden Jerona GAILS, MD;  Location: [facility] OR;  Service: Orthopedics;  Laterality: Left;  LEFT FOOT DEBRIDEMENT   IR GASTROSTOMY TUBE MOD SED  11/01/2024   IR IMAGING GUIDED PORT INSERTION  11/01/2024   JOINT REPLACEMENT     LOWER EXTREMITY ANGIOGRAPHY  07/09/2024   Procedure: Lower Extremity Angiography;  Surgeon: Serene Gaile ORN, MD;  [facility];  Service: Cardiovascular;;   LOWER EXTREMITY INTERVENTION  07/09/2024   Procedure: LOWER  EXTREMITY INTERVENTION;  Surgeon: Serene Gaile ORN, MD;  [facility];  Service: Cardiovascular;;   OTHER SURGICAL HISTORY     ganglion cyst removed left wrist    PERIPHERAL INTRAVASCULAR LITHOTRIPSY  07/09/2024   Procedure: PERIPHERAL INTRAVASCULAR LITHOTRIPSY;  Surgeon: Serene Gaile ORN, MD;  [facility];  Service: Cardiovascular;;   PERIPHERAL VASCULAR ATHERECTOMY  07/09/2024   Procedure: PERIPHERAL VASCULAR ATHERECTOMY;  Surgeon: Serene Gaile ORN, MD;  [facility];  Service: Cardiovascular;;   RADICAL NECK DISSECTION Left 08/21/2024   Procedure: DISSECTION, NECK, RADICAL;   Surgeon: Jesus Oliphant, MD;  Location: [facility] OR;  Service: ENT;  Laterality: Left;   TOTAL HIP ARTHROPLASTY  10/03/2011   Procedure: TOTAL HIP ARTHROPLASTY;  Surgeon: Dempsey LULLA Moan;  Location: [facility];  Service: Orthopedics;  Laterality: Right;   TOTAL HIP ARTHROPLASTY  12/21/2011   Procedure: TOTAL HIP ARTHROPLASTY;  Surgeon: Dempsey LULLA Moan, MD;  Location: [facility];  Service: Orthopedics;  Laterality: Left;   TUBAL LIGATION     VIDEO BRONCHOSCOPY WITH ENDOBRONCHIAL NAVIGATION Left 10/14/2024   Procedure: VIDEO BRONCHOSCOPY WITH ENDOBRONCHIAL NAVIGATION;  Surgeon: Shelah Lamar RAMAN, MD;  Location: [facility];  Service: Pulmonary;  Laterality: Left;   VIDEO BRONCHOSCOPY WITH ENDOBRONCHIAL ULTRASOUND Bilateral 10/14/2024   Procedure: BRONCHOSCOPY, WITH EBUS;  Surgeon: Shelah Lamar RAMAN, MD;  Location: [facility];  Service: Pulmonary;  Laterality: Bilateral;   Patient Active Problem List   Diagnosis Date Noted   Chemotherapy induced neutropenia 11/15/2024   Mediastinal adenopathy 10/14/2024   Lung nodule 10/07/2024   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) 09/20/2024   Paroxysmal atrial fibrillation (HCC) 08/21/2024   Peripheral arterial disease 07/12/2024   Dog bite of ankle, sequela 07/06/2024   Gangrene of left foot (HCC) 07/06/2024   B12 deficiency 09/13/2021   Allergic rhinitis    CKD (chronic kidney disease) stage 3, GFR 30-59 ml/min (HCC) 11/29/2013   Anemia of chronic disease 12/16/2011   Atherosclerosis of native artery of extremity with intermittent claudication 06/17/2009   Lumbar back pain with radiculopathy affecting left lower extremity 06/03/2009   Mixed hyperlipidemia 12/15/2008   Essential hypertension 12/15/2008   Open-angle glaucoma 10/27/2008    PCP: Jacques Schroeder, MD  REFERRING PROVIDER: Lauraine Golden, MD  REFERRING DIAG: C02.3 (ICD-10-CM) - 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)  THERAPY DIAG:  Abnormal posture - Plan: PT plan  of care cert/re-cert  Malignant neoplasm of anterior two-thirds of tongue, part unspecified (HCC) - Plan: PT plan of care cert/re-cert  Rationale for Evaluation and Treatment: Rehabilitation  ONSET DATE: 06/17/24  SUBJECTIVE:     SUBJECTIVE STATEMENT: Patient reports they are here today to be seen by their medical team for newly diagnosed cancer of tongue.    PERTINENT HISTORY:   Invasive to moderately differentiated SCC of the left tongue with cervical node involvement (left); p 16 negative, angiolymphatic invasion present. 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 On 08/14/23 she saw Dr. Jesus for evaluation of new white spots on the posterior left lateral aspect of the tongue. 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 10/02/23 biopsies done and showed moderate squamous dysplasia without evidence of carcinoma. 06/13/24 She again presented to Dr. Jesus with c/o worsening irritation to her left tongue over several months. Oral exam noted a 3 cm raised ulcerative lesion with surrounding leukoplakia to the left posterior  lateral tongue.  06/17/24 Based on Dr. Godfrey recommendations she underwent a left partial glossectomy. Pathology from the procedure revealed: tumor the size of 1.9 cm; histology of invasive well to moderately differentiated squamous cell carcinoma invading a depth of 0.9 cm, along with adjacent high-grade squamous dysplasia; p16 negative; angiolymphatic invasion present; all margins negative for dysplasia or carcinoma. No lymph nodes were excised. Based on the depth of invasion she then underwent a left modified neck dissection on 08/21/24. Pathology from the procedure revealed: 03/33 lymph nodes positive for metastatic SCC; the largest lymph node involved was a level II lymph node measuring 1.9 cm. The same large level II lymph node was also positive for focal extracapsular extension;  with a distance of less than 2 mm from the lymph node capsule (microscopic extranodal extension). 09/18/24 CT neck, chest, abdomen completed. CT neck showed a prominent heterogeneous left level 3/4 cervical lymph node measuring 1.0 x 1.2 cm with central hypoattenuation concerning for possible central necrosis; subtle asymmetric soft tissue at the left posterior tongue (likely related to recent surgical changes); and postsurgical changes of the left neck with presumed surgical absence of the left submandibular gland. CT of the abdomen on 09/18/24 showed no evidence of metastatic disease in the abdomen or acute findings overall. CT chest notable for somewhat suspicious lymph nodes in the mediastinum, over 1 cm; 8 mm left upper lobe pulmonary nodule that is indeterminate. 10/14/24 Biopsy of left upper lobe and mediastinal adenopathy by Dr. Shelah were negative for cancer. She will receive 30 fractions of radiation with weekly chemotherapy to her tongue and bilateral neck which started on 11/04/24 and complete 12/16/24. 06/17/24 Left partial glossectomy. 08/21/24 Left modified neck dissection. 11/01/24 PAC/PEG placement. Hx of PAD and afib  PATIENT GOALS:   to be educated about the signs and symptoms of lymphedema and learn post op HEP.   PAIN:  Are you having pain? No just mouth soreness  PRECAUTIONS: Active CA and Joint replacement bilateral hip replacement  RED FLAGS: None   WEIGHT BEARING RESTRICTIONS: No  FALLS:  Has patient fallen in last 6 months? Yes. Number of falls 1 fall that occurred yesterday Does the patient have a fear of falling that limits activity? No Is the patient reluctant to leave the house due to a fear of falling?No  LIVING ENVIRONMENT: Patient lives with: her son lives with her Lives in: House/apartment Has following equipment at home: Vannie - 2 wheeled  OCCUPATION: retired  LEISURE: uses stepper while seated daily for 30 min  PRIOR LEVEL OF FUNCTION:  Independent   OBJECTIVE: Note: Objective measures were completed at Evaluation unless otherwise noted.  COGNITION: Overall cognitive status: Within functional limits for tasks assessed                  POSTURE:  Forward head and rounded shoulders posture  30 SEC SIT TO STAND: 12 reps in 30 sec without use of UEs which is  Poor for patient's age since she was unable to stand without use of UEs  SHOULDER AROM:   Impaired L shoulder ROM limited secondary to L neck dissection surgery   CERVICAL AROM:   Percent limited  Flexion WFL  Extension 25% limited  Right lateral flexion WFL  Left lateral flexion WFL  Right rotation WFL  Left rotation WFL    (Blank rows=not tested)  GAIT: Assessed: Yes Assistance needed: Independent Ambulation Distance: 10 feet Assistive Device: none Gait pattern: genu valgum Ambulation surface: Level  PATIENT EDUCATION:  Education details: Neck ROM, importance of posture when sitting, standing and lying down, deep breathing, walking program and importance of staying active throughout treatment, CURE article on staying active, Why exercise? flyer, lymphedema and PT info Person educated: Patient Education method: Explanation, Demonstration, Handout Education comprehension: Patient verbalized understanding and returned demonstration  HOME EXERCISE PROGRAM: Patient was instructed today in a home exercise program today for head and neck range of motion exercises. These included active cervical flexion, active cervical extension, active cervical rotation to each direction, upper trap stretch, and shoulder retraction. Patient was encouraged to do these 2-3 times a day, holding for 5 sec each and completing for 5 reps. Pt was educated that once this becomes easier then hold the stretches for 30-60 seconds.    ASSESSMENT:  CLINICAL IMPRESSION: Pt arrives to PT with recently diagnosed tongue cancer. She will receive 30 fractions of radiation with weekly  chemotherapy to her tongue and bilateral neck which started on 11/04/24 and complete 12/16/24.  Pt's cervical ROM was limited by 25% in extension but all others were [facility]. Educated pt about signs and symptoms of lymphedema as well as anatomy and physiology of lymphatic system. Educated pt in importance of staying as active as possible throughout treatment to decrease fatigue as well as head and neck ROM exercises to decrease loss of ROM. Will see pt after completion of radiation to reassess ROM and assess for lymphedema and to determine therapy needs at that time.  Pt will benefit from skilled therapeutic intervention to improve on the following deficits: Decreased knowledge of precautions and postural dysfunction.   PT treatment/interventions: ADL/self-care home management, pt/family education, therapeutic exercise. Other interventions [id number]- PT Re-evaluation, 97110-Therapeutic exercises, 97530- Therapeutic activity, [id number]- Neuromuscular re-education, 97535- Self Care, [id number]- Manual therapy, 97760- Orthotic Initial, [phone]- Orthotic/Prosthetic subsequent, and Patient/Family education  REHAB POTENTIAL: Good  CLINICAL DECISION MAKING: Evolving/moderate complexity  EVALUATION COMPLEXITY: Moderate   GOALS: Goals reviewed with patient? YES  LONG TERM GOALS: (STG=LTG)   Name Target Date  Goal status  1 Patient will be able to verbalize understanding of a home exercise program for cervical range of motion, posture, and walking.   Baseline:  No knowledge 11/21/2024 Achieved at eval  2 Patient will be able to verbalize understanding of proper sitting and standing posture. Baseline:  No knowledge 11/21/2024 Achieved at eval  3 Patient will be able to verbalize understanding of lymphedema risk and availability of treatment for this condition Baseline:  No knowledge 11/21/2024 Achieved at eval  4 Pt will demonstrate a return to full cervical ROM and function post operatively compared to baselines and not  demonstrate any signs or symptoms of lymphedema.  Baseline: See objective measurements taken today. 02/13/25 New    PLAN:  PT FREQUENCY/DURATION: EVAL and 1 follow up appointment.   PLAN FOR NEXT SESSION: will reassess 2 weeks after completion of radiation to determine needs.  Patient will follow up at outpatient cancer rehab 2 weeks after completion of radiation.  If the patient requires physical therapy at that time, a specific plan will be dictated and sent to the referring physician for approval. The patient was educated today on appropriate basic range of motion exercises to begin now and continue throughout radiation and educated on the signs and symptoms of lymphedema. Patient verbalized good understanding.     Physical Therapy Information for During and After Head/Neck Cancer Treatment: Lymphedema is a swelling condition that you may be at risk for in your neck and/or face if you have  radiation treatment to the area and/or if you have surgery that includes removing lymph nodes.  There is treatment available for this condition and it is not life-threatening.  Contact your physician or physical therapist with concerns. An excellent resource for those seeking information on lymphedema is the National Lymphedema Network's website.  It can be accessed at www.lymphnet.org If you notice swelling in your neck or face at any time following surgery (even if it is many years from now), please contact your doctor or physical therapist to discuss this.  Lymphedema can be treated at any time but it is easier for you if it is treated early on. If you have had surgery to your neck, please check with your surgeon about how soon to start doing neck range of motion exercises.  If you are not having surgery, I encourage you to start doing neck range of motion exercises today and continue these while undergoing treatment, UNLESS you have irritation of your skin or soft tissue that is aggravated by doing them.   These exercises are intended to help you prevent loss of range of motion and/or to gain range of motion in your neck (which can be limited by tightening effects of radiation), and NOT to aggravate these tissues if they develop sensitivities from treatment. Neck range of motion exercises should be done to the point of feeling a GENTLE, TOLERABLE stretch only.  You are encouraged to start a walking or other exercise program tomorrow and continue this as much as you are able through and after treatment.  Please feel free to call me with any questions. Florina Lanis Carbon, PT, CLT Physical Therapist and Certified Lymphedema Therapist [facility] [address], [address], [place], [place] [address] [phone] [email or web address]  WALKING  Walking is a great form of exercise to increase your strength, endurance and overall fitness.  A walking program can help you start slowly and gradually build endurance as you go.  Everyone's ability is different, so each person's starting point will be different.  You do not have to follow them exactly.  The are just samples. You should simply find out what's right for you and stick to that program.   In the beginning, you'll start off walking 2-3 times a day for short distances.  As you get stronger, you'll be walking further at just 1-2 times per day.  A. You Can Walk For A Certain Length Of Time Each Day    Walk 5 minutes 3 times per day.  Increase 2 minutes every 2 days (3 times per day).  Work up to 25-30 minutes (1-2 times per day).   Example:   Day 1-2 5 minutes 3 times per day   Day 7-8 12 minutes 2-3 times per day   Day 13-14 25 minutes 1-2 times per day  B. You Can Walk For a Certain Distance Each Day     Distance can be substituted for time.    Example:   3 trips to mailbox (at road)   3 trips to corner of block   3 trips around the block  C. Go to local high school and use the track.    Walk for  distance ____ around track  Or time ____ minutes  D. Walk ____ Jog ____ Run ___   Why exercise?  So many benefits! Here are SOME of them: Heart health, including raising your good cholesterol level and reducing heart rate and blood pressure Lung health, including improved lung capacity It  burns fats, and most of us  can stand to be leaner, whether or not we are overweight. It increases the body's natural painkillers and mood elevators, so makes you feel better. Not only makes you feel better, but look better too Improves sleep Takes a bite out of stress May decrease your risk of many types of cancer If you are currently undergoing cancer treatment, exercise may improve your ability to tolerate treatments including chemotherapy. For everybody, it can improve your energy level. Those with cancer-related fatigue report a 40-50% reduction in this symptom when exercising regularly. If you are a survivor of breast, colon, or prostate cancer, it may decrease your risk of a recurrence. (This may hold for other cancers too, but so far we have data just for these three types.)  How to exercise: Get your doctor's okay. Pick something you enjoy doing, like walking, Zumba, biking, swimming, or whatever. Start at low intensity and time, then gradually increase.  (See walking program handout.) Set a goal to achieve over time.  The American Cancer Society, American Heart Association, and U.S. Dept. of Health and Human Services recommend 150 minutes of moderate exercise, 75 minutes of vigorous exercise, or a combination of both per week. This should be done in episodes at least 10 minutes long, spread throughout the week.  Need help being motivated? Pick something you enjoy doing, because you'll be more inclined to stick with that activity than something that feels like a chore. Do it with a friend so that you are accountable to each other. Schedule it into your day. Place it on your calendar and keep  that appointment just like you do any appointment that you make. Join an exercise group that meets at a specific time.  That way, you have to show up on time, and that makes it harder to procrastinate about doing your workout.  It also keeps you accountable--people begin to expect you to be there. Join a gym where you feel comfortable and not intimidated, at the right cost. Sign up for something that you'll need to be in shape for on a specific date, like a 1K or a 5K to walk or run, a 20 or 30 mile bike ride, a mud run or something like that. If the date is looming, you know you'll need to train to be ready for it.  An added benefit is that many of these are fundraisers for good causes. If you've already paid for a gym membership, group exercise class or event, you might as well work out, so you haven't wasted your money!    [facility] Cable, PT 11/21/2024, 10:32 AM                       "

## 2024-11-22 ENCOUNTER — Inpatient Hospital Stay: Admitting: Dietician

## 2024-11-22 ENCOUNTER — Other Ambulatory Visit: Payer: Self-pay

## 2024-11-22 ENCOUNTER — Inpatient Hospital Stay: Admitting: Oncology

## 2024-11-22 ENCOUNTER — Inpatient Hospital Stay

## 2024-11-22 ENCOUNTER — Ambulatory Visit
Admission: RE | Admit: 2024-11-22 | Discharge: 2024-11-22 | Disposition: A | Discharge status: Home / Self Care | Source: Ambulatory Visit | Attending: Radiation Oncology | Admitting: Radiation Oncology

## 2024-11-22 ENCOUNTER — Telehealth: Payer: Self-pay | Admitting: Oncology

## 2024-11-22 ENCOUNTER — Encounter: Payer: Self-pay | Admitting: Oncology

## 2024-11-22 VITALS — BP 122/65 | HR 97 | Temp 97.6°F | Resp 16 | Wt 127.7 lb

## 2024-11-22 DIAGNOSIS — T451X5A Adverse effect of antineoplastic and immunosuppressive drugs, initial encounter: Secondary | ICD-10-CM

## 2024-11-22 DIAGNOSIS — D701 Agranulocytosis secondary to cancer chemotherapy: Secondary | ICD-10-CM | POA: Diagnosis not present

## 2024-11-22 DIAGNOSIS — C023 Malignant neoplasm of anterior two-thirds of tongue, part unspecified: Secondary | ICD-10-CM

## 2024-11-22 LAB — RAD ONC ARIA SESSION SUMMARY
Course Elapsed Days: 18
Plan Fractions Treated to Date: 14
Plan Prescribed Dose Per Fraction: 2.2 Gy
Plan Total Fractions Prescribed: 30
Plan Total Prescribed Dose: 66 Gy
Reference Point Dosage Given to Date: 30.8 Gy
Reference Point Session Dosage Given: 2.2 Gy
Session Number: 14

## 2024-11-22 LAB — CBC WITH DIFFERENTIAL (CANCER CENTER ONLY)
Abs Immature Granulocytes: 0.01 K/uL (ref 0.00–0.07)
Basophils Absolute: 0 K/uL (ref 0.0–0.1)
Basophils Relative: 2 %
Eosinophils Absolute: 0 K/uL (ref 0.0–0.5)
Eosinophils Relative: 0 %
HCT: 23 % — ABNORMAL LOW (ref 36.0–46.0)
Hemoglobin: 7.8 g/dL — ABNORMAL LOW (ref 12.0–15.0)
Immature Granulocytes: 1 %
Lymphocytes Relative: 24 %
Lymphs Abs: 0.2 K/uL — ABNORMAL LOW (ref 0.7–4.0)
MCH: 31.3 pg (ref 26.0–34.0)
MCHC: 33.9 g/dL (ref 30.0–36.0)
MCV: 92.4 fL (ref 80.0–100.0)
Monocytes Absolute: 0.1 K/uL (ref 0.1–1.0)
Monocytes Relative: 5 %
Neutro Abs: 0.7 K/uL — ABNORMAL LOW (ref 1.7–7.7)
Neutrophils Relative %: 68 %
Platelet Count: 131 K/uL — ABNORMAL LOW (ref 150–400)
RBC: 2.49 MIL/uL — ABNORMAL LOW (ref 3.87–5.11)
RDW: 15.8 % — ABNORMAL HIGH (ref 11.5–15.5)
Smear Review: NORMAL
WBC Count: 1 K/uL — ABNORMAL LOW (ref 4.0–10.5)
nRBC: 0 % (ref 0.0–0.2)

## 2024-11-22 LAB — CMP (CANCER CENTER ONLY)
ALT: 13 U/L (ref 0–44)
AST: 18 U/L (ref 15–41)
Albumin: 3.8 g/dL (ref 3.5–5.0)
Alkaline Phosphatase: 58 U/L (ref 38–126)
Anion gap: 13 (ref 5–15)
BUN: 44 mg/dL — ABNORMAL HIGH (ref 8–23)
CO2: 20 mmol/L — ABNORMAL LOW (ref 22–32)
Calcium: 8.3 mg/dL — ABNORMAL LOW (ref 8.9–10.3)
Chloride: 99 mmol/L (ref 98–111)
Creatinine: 1.51 mg/dL — ABNORMAL HIGH (ref 0.44–1.00)
GFR, Estimated: 35 mL/min — ABNORMAL LOW
Glucose, Bld: 126 mg/dL — ABNORMAL HIGH (ref 70–99)
Potassium: 5.1 mmol/L (ref 3.5–5.1)
Sodium: 132 mmol/L — ABNORMAL LOW (ref 135–145)
Total Bilirubin: 0.7 mg/dL (ref 0.0–1.2)
Total Protein: 6.9 g/dL (ref 6.5–8.1)

## 2024-11-22 MED ORDER — FILGRASTIM-SNDZ 300 MCG/0.5ML IJ SOSY
300.0000 ug | PREFILLED_SYRINGE | Freq: Every day | INTRAMUSCULAR | Status: AC
  Administered 2024-11-22: 300 ug via SUBCUTANEOUS

## 2024-11-22 MED ORDER — NUTREN 1.5 EN LIQD: ENTERAL | Status: AC

## 2024-11-22 MED ORDER — AZITHROMYCIN 250 MG PO TABS: ORAL_TABLET | ORAL | 0 refills | Status: DC

## 2024-11-22 NOTE — Progress Notes (Signed)
 Nutrition Follow-up:  Patient with SCC of tongue. Patient receiving concurrent chemotherapy (carboplatin  and taxol ) and radiation. Followed by Dr Autumn and Izell.   Chemo held today   Met with patient and sister in office. Observed bruising to forehead and left eye. Patient reports fall at home a couple days ago. She did not seek medical attention. Patient is fatigued. Nodding off during visit. Limited history obtained. She continues to have diarrhea. Last episode was this morning. Imodium is helping. Patient had only consumed water before episode. Patient did trial KF 1.4 to see if diarrhea was related to Osmolite. She did not notice improvement to symptoms. Currently giving one carton KF in the morning and one in the evening. Patient only drinking water by mouth. She does not know how much.   Medications: reviewed   Labs: Hgb 7.8, Na 132, glucose 126, BUN 44, Cr 1.51  Anthropometrics: Wt 127 lb 11.2 oz today decreased 3% in 7 days which is severe for time frame  1/9 - 131 lb 4.8 oz 12/23 - 135 lb 6.4 oz   Estimated Energy Needs  Kcals: 1500-1700 Protein: 70-88 Fluid: >1.5 L  NUTRITION DIAGNOSIS: Inadequate oral intake continues - supplementing with TF   INTERVENTION:  Continue antidiarrheals per MD - offered samples of Banatrol for pt to try, educated on administration po or via tube Suggested adding in fluids with electrolytes to support GI losses - samples of liquid IV provided Reinforced importance of adequate calorie/protein energy intake to minimize additional wt loss and support post-treatment healing  Pt agreeable to work on increasing tube feedings over the weekend Will contact [employer] to set up shipment of formula/supplies - pt reports having enough formula samples to last the weekend  Anticipate Long Term Enteral Needs: Nutren 1.5 - give one 250 ml carton 5x/day via PEG. Flush tube with 60 ml water before and after each bolus. Give additional 120 ml free water TID to  meet hydration. Provides 1875 kcal, 85 g, 955 ml water (1915 ml total water). 1250 ml/day meets 100% DRI  MONITORING, EVALUATION, GOAL: wt trends, TF   NEXT VISIT: Friday January 23 after MD

## 2024-11-22 NOTE — Progress Notes (Signed)
 "  [facility]  ONCOLOGY CLINIC PROGRESS NOTE   Patient Care Team: Watt Mirza, MD as PCP - General Jordan, Peter M, MD as PCP - Cardiology (Cardiology) Jesus Oliphant, MD as Consulting Physician (Otolaryngology) Izell Domino, MD as Attending Physician (Radiation Oncology) Malmfelt, Delon CROME, RN as Oncology Nurse Navigator Autumn Millman, MD as Consulting Physician (Oncology) Shelah Lamar RAMAN, MD as Consulting Physician (Pulmonary Disease)  PATIENT NAME: Taylor Bush   MR#: [id number] DOB: 1946-02-08  Date of visit: 11/22/2024   ASSESSMENT & PLAN:   Taylor Bush is a 79 y.o. pleasant lady with a past medical history of CKD stage III, hypertension, dyslipidemia, arthritis, peripheral vascular disease, past smoking, was referred to our clinic for recently diagnosed squamous of carcinoma of the tongue status post left partial glossectomy on 06/17/2024 followed by neck dissection on 08/21/2024.  pT2, pN3B, M0 disease. Stage IVb disease.   Malignant neoplasm of anterior two-thirds of tongue [facility]) Please review oncology history for additional details and timeline of events.  Post-surgical status following tongue cancer with cervical lymph node involvement and extracapsular extension.   Neck dissection on October 15th with removal of 33 lymph nodes, 3 with cancer. High risk of recurrence due to extracapsular extension.   CT chest on 09/18/2024 showed mild mediastinal lymphadenopathy and metastatic involvement could not be excluded.  8 mm anterior left upper lobe lung nodule noted as well.  On her consultation with us  on 09/20/2024, request placed for staging PET-CT scan for further evaluation of chest lymphadenopathy.   On 10/01/2024, PET scan showed hypermetabolic left cervical lymph node, indicative of residual/recurrent disease. Hypermetabolic 4 mm parotid nodule, worrisome for malignancy. No abnormal hypermetabolism associated with the left posterior tongue, as  questioned on CT neck 09/18/2024. Small right pleural effusion. Aortic atherosclerosis (ICD10-I70.0). Coronary artery calcification. Enlarged pulmonic trunk, indicative of pulmonary arterial hypertension.  She had a follow-up CT super D of chest on 10/11/2024 which showed  7 mm anterior segment left upper lobe nodule, unchanged from 09/18/2024 and too small for PET resolution on 10/01/2024. This could be followed with CT chest without contrast in 3 months, as clinically indicated, in this patient with a history of tongue cancer. Small bilateral pleural effusions.  On 10/14/2024, she underwent bronchoscopy and biopsy of the left upper lobe lung nodule and lymph nodes and pathology showed reactive cells without evidence of malignancy.  pT2, pN3B, M0 disease. Stage IVb disease.   Plan made to proceed with concurrent chemoradiation.   Given her CKD, she is not a candidate for cisplatin.  Hence plan made to proceed with weekly carboplatin /paclitaxel .  Carboplatin  dose reduced to AUC 1.5, considering her age and comorbidities.  Received cycle 1 of carboplatin  and paclitaxel  on 11/04/2024.  Tolerating concurrent chemoradiation well with some expected side effects including mucositis, cytopenias  She is due for cycle 4 of carboplatin  and paclitaxel , currently scheduled on 11/25/2024.  Labs today however showed leukopenia with white count of 1000, ANC of 700, hemoglobin 7.8, platelet 131,000.  Given neutropenia, we will defer chemotherapy by at least a week.  She is receiving Zarxio  dose #3 today.  We will give her 3 more dose of Zarxio  next week from Monday to Wednesday.  RTC in 1 week for follow-up, repeat labs for possible resumption of chemotherapy.  Chemotherapy induced neutropenia She developed chemotherapy-induced leukopenia with a significant decrease in white blood cell count.    White count was 2100 last week and it is lower at 1000 today  with ANC of 700.  This is prohibitive to proceed with  chemotherapy on 11/25/2024.  We will hold chemotherapy and proceed with 3 more doses of Zarxio  injection daily from Monday to Wednesday next week.  - Scheduled booster injections to coincide with radiation appointments for convenience. - Advised her to wear a mask and avoid crowds to minimize infection risk.  Oral mucositis due to antineoplastic therapy Oral mucositis presenting as gingival bleeding during brushing, attributed to radiation and chemotherapy. No evidence of superimposed infection. She adheres to recommended oral care measures. - Continued nonalcoholic mouthwash and baking soda/salt rinses as tolerated.  Chemotherapy-induced thrombocytopenia and anemia Worsening thrombocytopenia and anemia despite chemotherapy dose reduction, secondary to ongoing chemoradiation. Cytopenias require close monitoring and holding of chemotherapy until recovery. - Monitored complete blood count closely. - Held chemotherapy until blood counts recover to safe levels.  Diarrhea Diarrhea with 3-4 loose stools daily, managed with loperamide . No abdominal pain or cramping. Symptoms consistent with antineoplastic therapy side effects. - Continued loperamide  as needed (2 tablets initially, then 1 tablet after each loose bowel movement).  Prophylactic antibiotics for infection risk in immunosuppression Ongoing neutropenia places her at high risk for infection. No current fever or signs of active infection. Cough is likely viral and managed symptomatically. Prophylactic antibiotics indicated. - Prescribed azithromycin  (Z-Pak): 2 tablets on day 1, then 1 tablet daily for a total of 5 days. - Advised use of over-the-counter cough medications (Robitussin D or Mucinex  D) as needed for cough symptoms.  Chronic kidney disease She has mild chronic kidney disease with fluctuating creatinine. Chemotherapy regimen and dosing were selected to minimize nephrotoxicity, with ongoing monitoring and dose adjustments as  needed. - Selected carboplatin  for chemotherapy and dose-reduced based on renal function. - Planned ongoing adjustment of chemotherapy dosing according to renal function.   I reviewed lab results and outside records for this visit and discussed relevant results with the patient. Diagnosis, plan of care and treatment options were also discussed in detail with the patient. Opportunity provided to ask questions and answers provided to her apparent satisfaction. Provided instructions to call our clinic with any problems, questions or concerns prior to return visit. I recommended to continue follow-up with PCP and sub-specialists. She verbalized understanding and agreed with the plan.   NCCN guidelines have been consulted in the planning of this patients care.  I spent a total of 45 minutes during this encounter with the patient including review of chart and various tests results, discussions about plan of care and coordination of care plan.   Chinita Patten, MD  11/22/2024 5:14 PM  [facility] - A DEPT OF [facility] [address] [place] [address] Dept: [phone] Dept Fax: [phone]    CHIEF COMPLAINT/ REASON FOR VISIT:   Squamous of carcinoma of the tongue status post left partial glossectomy on 06/17/2024 followed by neck dissection on 08/21/2024.  pT2, pN3B, M0 disease. Stage IVb disease.   Current Treatment: Adjuvant concurrent chemoradiation with weekly carboplatin /paclitaxel , starting from 11/04/2024  INTERVAL HISTORY:    Discussed the use of AI scribe software for clinical note transcription with the patient, who gave verbal consent to proceed.  History of Present Illness Taylor Bush is a 79 year old female with malignant neoplasm of the anterior tongue with lymph node involvement, currently undergoing concurrent chemoradiation, who presents for follow-up of worsening cytopenias and treatment-related  complications.  She is receiving chemotherapy and radiation for tongue cancer.  Over the past week, her white blood cell count declined from 2,100 to 1,000, platelet count from 208,000 to 131,000, and hemoglobin from 8.5 to 7.8. She has received booster injections for cytopenias, including one today. Chemotherapy scheduled for Monday was held.  She reports oral mucositis manifesting as gingival bleeding during tooth brushing, which began after initiation of radiation therapy. She uses nonalcoholic mouthwash and baking soda/salt rinses as recommended. Bleeding is limited to brushing, and she denies dysphagia. Her voice is hoarse, but she is able to speak and swallow without difficulty. She is not currently receiving speech therapy but has been evaluated by nutrition services.  She describes a new, non-productive cough with occasional clear sputum, without fever or hemoptysis. She is not taking medication for the cough. Diarrhea persists, with three to four loose bowel movements daily, managed with loperamide ; she denies abdominal pain or cramping.  She experienced a fall at home due to a loose board, resulting in bruising but without loss of consciousness or syncope. She presented to the emergency department the night prior for a feeding tube issue, which was resolved without further complication.   I have reviewed the past medical history, past surgical history, social history and family history with the patient and they are unchanged from previous note.  HISTORY OF PRESENT ILLNESS:   ONCOLOGY HISTORY:   She has a history of recurrent oral leukoplakia followed by Dr. Jesus since at least 2016. She also had a medical history notable for stage 3 CKD, chronic a-fib, and PAD.   Upon record review, she presented to Dr. Jesus on 08/14/23 for evaluation of new white spots on the posterior left lateral aspect of the tongue. Recurrent oral leukoplakia was suspected and laser ablation and biopsies under  anesthesia were recommended (which she had undergone in the past in the same setting). Biopsies of the left tongue were promptly obtained on 10/02/23 and showed moderate squamous dysplasia without evidence of carcinoma.    She remained without issues to her tongue in the following months but later presented to Dr. Jesus on 06/13/24 with c/o worsening irritation to the left side of her tongue over the past several months. Oral exam performed at that time noted a 3 cm raised ulcerative lesion with surrounding leukoplakia to the left posterior lateral tongue.    Based on Dr. Godfrey recommendations, she opted to proceed with a left partial glossectomy on 06/17/24. Pathology from the procedure revealed: tumor the size of 1.9 cm; histology of invasive well to moderately differentiated squamous cell carcinoma invading a depth of 0.9 cm, along with adjacent high-grade squamous dysplasia; p16 negative; angiolymphatic invasion present; all margins negative for dysplasia or carcinoma. No lymph nodes were excised.    Based on the depth of invasion, she underwent left modified neck dissection on 08/21/24. Pathology from the procedure revealed: 03/33 lymph nodes positive for metastatic SCC; the largest lymph node involved was a level II lymph node measuring 1.9 cm. The same large level II lymph node was also positive for focal extracapsular extension; with a distance of less than 2 mm from the lymph node capsule (microscopic extranodal extension).    Her post-op course was uncomplicated per her subsequent follow-up examinations by Dr. Jesus. Radiation therapy has been recommended and Dr. Jesus also referred her to medical oncology.    Pertinent imaging performed thus far includes:  -- Soft tissue neck CT with contrast on 09/18/24 which demonstrated: a prominent heterogeneous left level 3/4 cervical lymph node measuring 1.0 x 1.2 cm  with central hypoattenuation concerning for possible central necrosis; subtle  asymmetric soft tissue at the left posterior tongue (likely related to recent surgical changes); and postsurgical changes of the left neck with presumed surgical absence of the left submandibular gland.  -- CT of the abdomen on 09/18/24 showed no evidence of metastatic disease in the abdomen or acute findings overall.  --CT Chest 09/18/24:  Lower cervical lymphadenopathy characterized on CT neck performed at the same time and reported separately. Mild mediastinal lymphadenopathy. Metastatic involvement not excluded. PET-CT suggested to further evaluate. 8 mm anterior left upper lobe pulmonary nodule shows mixed attenuation with sub solid features. This nodule warrants close follow-up as metastatic disease or lung primary not excluded. PET-CT may prove helpful.  Small right pleural effusion with dependent atelectasis right lower lobe.   On her consultation with us  on 09/20/2024, request placed for staging PET-CT scan for further evaluation of chest lymphadenopathy.   On 10/01/2024, PET scan showed hypermetabolic left cervical lymph node, indicative of residual/recurrent disease. Hypermetabolic 4 mm parotid nodule, worrisome for malignancy. No abnormal hypermetabolism associated with the left posterior tongue, as questioned on CT neck 09/18/2024. Small right pleural effusion. Aortic atherosclerosis (ICD10-I70.0). Coronary artery calcification. Enlarged pulmonic trunk, indicative of pulmonary arterial hypertension.  She had a follow-up CT super D of chest on 10/11/2024 which showed  7 mm anterior segment left upper lobe nodule, unchanged from 09/18/2024 and too small for PET resolution on 10/01/2024. This could be followed with CT chest without contrast in 3 months, as clinically indicated, in this patient with a history of tongue cancer. Small bilateral pleural effusions.  On 10/14/2024, she underwent bronchoscopy and biopsy of the left upper lobe lung nodule and lymph nodes and pathology showed reactive  cells without evidence of malignancy.  pT2, pN3B, M0 disease. Stage IVb disease.   Plan made to proceed with concurrent chemoradiation.   Given her CKD, she is not a candidate for cisplatin.  Hence plan made to proceed with weekly carboplatin /paclitaxel .  Carboplatin  dose reduced to AUC 1.5, considering her age and comorbidities.  She started concurrent chemoradiation from 11/04/2024.   Oncology History  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)  09/20/2024 Initial Diagnosis   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)   09/20/2024 Cancer Staging   Staging form: Oral Cavity, AJCC 8th Edition - Pathologic stage from 09/20/2024: Stage IVB (pT2, pN3b, cM0) - Signed by Autumn Millman, MD on 10/18/2024 Stage prefix: Initial diagnosis   11/04/2024 -  Chemotherapy   Patient is on Treatment Plan : HEAD/NECK Carboplatin  + Paclitaxel  + XRT q7d         REVIEW OF SYSTEMS:   Review of Systems - Oncology  All other pertinent systems were reviewed with the patient and are negative.  ALLERGIES: She is allergic to statins.  MEDICATIONS:  Current Outpatient Medications  Medication Sig Dispense Refill   amLODipine  (NORVASC ) 10 MG tablet TAKE 1 TABLET BY MOUTH DAILY 100 tablet 2   apixaban  (ELIQUIS ) 5 MG TABS tablet Take 1 tablet (5 mg total) by mouth 2 (two) times daily. 60 tablet 3   atorvastatin  (LIPITOR ) 80 MG tablet Take 1 tablet (80 mg total) by mouth daily. 30 tablet 3   azithromycin  (ZITHROMAX  Z-PAK) 250 MG tablet Take 2 tablets by mouth on day 1, then 1 tablet daily until complete 6 each 0   clopidogrel  (PLAVIX ) 75 MG tablet Take 1 tablet (75 mg total) by mouth daily with breakfast. 30 tablet 3  Faricimab -svoa (VABYSMO  IZ) 1 Dose by Intravitreal route every 6 (six) weeks.     lidocaine  (XYLOCAINE ) 2 % solution Patient: Mix 1 part 2% viscous lidocaine , 1 part water . Swish & swallow 10mL of diluted mixture 30 minutes before meals and at bedtime, up to 4  times daily 200 mL 3   lidocaine -prilocaine  (EMLA ) cream Apply to affected area once 30 g 3   lisinopril  (ZESTRIL ) 20 MG tablet TAKE 1 TABLET BY MOUTH DAILY 100 tablet 2   magic mouthwash (nystatin , lidocaine , diphenhydrAMINE , alum & mag hydroxide) suspension Take 5 mLs by mouth 4 (four) times daily as needed for mouth pain. Suspension contains equal amounts of Maalox Extra Strength, nystatin , diphenhydramine  and lidocaine . 1:1:1:1 ratio please. 250 mL 4   metoprolol  (TOPROL -XL) 200 MG 24 hr tablet TAKE 1 TABLET BY MOUTH DAILY 100 tablet 2   ondansetron  (ZOFRAN ) 8 MG tablet Take 1 tablet (8 mg total) by mouth every 8 (eight) hours as needed for nausea or vomiting. Start on the third day after chemotherapy. 30 tablet 1   ondansetron  (ZOFRAN -ODT) 4 MG disintegrating tablet Take 1 tablet (4 mg total) by mouth every 8 (eight) hours as needed for nausea or vomiting. 20 tablet 0   oxyCODONE  (OXY IR/ROXICODONE ) 5 MG immediate release tablet Take 1 tablet (5 mg total) by mouth every 6 (six) hours as needed for severe pain (pain score 7-10). 60 tablet 0   prochlorperazine  (COMPAZINE ) 10 MG tablet Take 1 tablet (10 mg total) by mouth every 6 (six) hours as needed for nausea or vomiting. 30 tablet 1   sodium fluoride (FLUORISHIELD) 1.1 % GEL dental gel Apply thin layer of fluoride gel inside the trays and wear them for 20 minutes nightly. Start after completion of HNRT     traMADol  (ULTRAM ) 50 MG tablet Take 1 tablet (50 mg total) by mouth every 8 (eight) hours as needed. 40 tablet 2   Nutritional Supplements (NUTREN 1.5) LIQD Nutren 1.5 - give one 250 ml carton 5x/day via PEG. Flush tube with 60 ml water  before and after each bolus. Give additional 120 ml free water  TID to meet hydration. Provides 1875 kcal, 85 g, 955 ml water  (1915 ml total water ). 1250 ml/day meets 100% DRI     No current facility-administered medications for this visit.   Facility-Administered Medications Ordered in Other Visits  Medication  Dose Route Frequency Provider Last Rate Last Admin   filgrastim -sndz (ZARXIO ) injection 300 mcg  300 mcg Subcutaneous Q1200 Ellsworth Waldschmidt, MD   300 mcg at 11/22/24 1036     VITALS:   Blood pressure 122/65, pulse 97, temperature 97.6 F (36.4 C), temperature source Temporal, resp. rate 16, weight 127 lb 11.2 oz (57.9 kg), SpO2 99%.  Wt Readings from Last 3 Encounters:  11/22/24 127 lb 11.2 oz (57.9 kg)  11/18/24 127 lb 8 oz (57.8 kg)  11/15/24 131 lb 4.8 oz (59.6 kg)    Body mass index is 24.94 kg/m.    Onc Performance Status - 11/22/24 0951       ECOG Perf Status   ECOG Perf Status Ambulatory and capable of all selfcare but unable to carry out any work activities.  Up and about more than 50% of waking hours      KPS SCALE   KPS % SCORE Normal activity with effort, some s/s of disease          PHYSICAL EXAM:   Physical Exam Constitutional:      General: She is not in  acute distress.    Appearance: Normal appearance.  HENT:     Head: Normocephalic and atraumatic.     Mouth/Throat:     Comments: Well-healed scar along the left tongue bordering the floor of mouth  Cardiovascular:     Rate and Rhythm: Normal rate.  Pulmonary:     Effort: Pulmonary effort is normal. No respiratory distress.  Abdominal:     General: There is no distension.  Lymphadenopathy:     Cervical: Cervical adenopathy (Left-sided neck dissection scar has healed well.  Mild lymphedema noted.) present.  Neurological:     General: No focal deficit present.     Mental Status: She is alert and oriented to person, place, and time.  Psychiatric:        Mood and Affect: Mood normal.        Behavior: Behavior normal.      LABORATORY DATA:   I have reviewed the data as listed.  Results for orders placed or performed in visit on 11/22/24  Rad Onc Aria Session Summary  Result Value Ref Range   Course ID [id number]    Course Start Date 10/23/2024    Session Number 14    Course First Treatment Date  11/04/2024 10:40 AM    Course Last Treatment Date 11/22/2024 10:57 AM    Course Elapsed Days 18    Reference Point ID [email or web address]_[email or web address] DP    Reference Point Dosage Given to Date 30.8 Gy   Reference Point Session Dosage Given 2.2 Gy   Plan ID [email or web address]_Tongu    Plan Name HN_L_Tongu    Plan Fractions Treated to Date 14    Plan Total Fractions Prescribed 30    Plan Prescribed Dose Per Fraction 2.2 Gy   Plan Total Prescribed Dose 66.000000 Gy   Plan Primary Reference Point [email or web address]_Tongu DP   Results for orders placed or performed in visit on 11/22/24  CMP (Cancer Center only)  Result Value Ref Range   Sodium 132 (L) 135 - 145 mmol/L   Potassium 5.1 3.5 - 5.1 mmol/L   Chloride 99 98 - 111 mmol/L   CO2 20 (L) 22 - 32 mmol/L   Glucose, Bld 126 (H) 70 - 99 mg/dL   BUN 44 (H) 8 - 23 mg/dL   Creatinine 8.48 (H) 9.55 - 1.00 mg/dL   Calcium  8.3 (L) 8.9 - 10.3 mg/dL   Total Protein 6.9 6.5 - 8.1 g/dL   Albumin 3.8 3.5 - 5.0 g/dL   AST 18 15 - 41 U/L   ALT 13 0 - 44 U/L   Alkaline Phosphatase 58 38 - 126 U/L   Total Bilirubin 0.7 0.0 - 1.2 mg/dL   GFR, Estimated 35 (L) >60 mL/min   Anion gap 13 5 - 15  CBC with Differential (Cancer Center Only)  Result Value Ref Range   WBC Count 1.0 (L) 4.0 - 10.5 K/uL   RBC 2.49 (L) 3.87 - 5.11 MIL/uL   Hemoglobin 7.8 (L) 12.0 - 15.0 g/dL   HCT 76.9 (L) 63.9 - 53.9 %   MCV 92.4 80.0 - 100.0 fL   MCH 31.3 26.0 - 34.0 pg   MCHC 33.9 30.0 - 36.0 g/dL   RDW 84.1 (H) 88.4 - 84.4 %   Platelet Count 131 (L) 150 - 400 K/uL   nRBC 0.0 0.0 - 0.2 %   Neutrophils Relative % 68 %   Neutro Abs 0.7 (L) 1.7 - 7.7 K/uL   Lymphocytes Relative 24 %   Lymphs Abs  0.2 (L) 0.7 - 4.0 K/uL   Monocytes Relative 5 %   Monocytes Absolute 0.1 0.1 - 1.0 K/uL   Eosinophils Relative 0 %   Eosinophils Absolute 0.0 0.0 - 0.5 K/uL   Basophils Relative 2 %   Basophils Absolute 0.0 0.0 - 0.1 K/uL   WBC Morphology See Note    Smear Review Normal platelet morphology    Immature Granulocytes 1  %   Abs Immature Granulocytes 0.01 0.00 - 0.07 K/uL   Ovalocytes PRESENT        RADIOGRAPHIC STUDIES:  I have personally reviewed the radiological images as listed and agree with the findings in the report.  IR Gastrostomy Tube INDICATION: Chemotherapy with anticipated ineffective nurtritional intake. History of head and neck cancer.  EXAM: PERCUTANEOUS GASTROSTOMY TUBE PLACEMENT  COMPARISON:  PET-CT, 10/01/2024  MEDICATIONS: Ancef  2 gm IV; Antibiotics were administered within 1 hour of the procedure.  0.5 mg glucagon  IV.  CONTRAST:  20 mL of Isovue 300 administered into the gastric lumen.  ANESTHESIA/SEDATION: Moderate (conscious) sedation was employed during this procedure. A total of Versed  2 mg and Fentanyl  100 mcg was administered intravenously.  Moderate Sedation Time: 14 minutes. The patient's level of consciousness and vital signs were monitored continuously by radiology nursing throughout the procedure under my direct supervision.  FLUOROSCOPY: Radiation Exposure Index and estimated peak skin dose (PSD);  Reference air kerma (RAK), 2 mGy.  COMPLICATIONS: None immediate.  PROCEDURE: Informed written consent was obtained from the patient and/or patient's representative following explanation of the procedure, risks, benefits and alternatives. A time out was performed prior to the initiation of the procedure. Maximal barrier sterile technique utilized including caps, mask, sterile gowns, sterile gloves, large sterile drape, hand hygiene and sterile prep.  The LEFT costal margin and barium opacified transverse colon were identified and avoided. Air was injected into the stomach for insufflation and visualization under fluoroscopy. Under sterile conditions and local anesthesia, 2 T tacks were utilized to pexy the anterior aspect of the stomach against the ventral abdominal wall. Contrast injection confirmed appropriate positioning of each of the T  tacks. An incision was made between the T tacks and a 17 gauge trocar needle was utilized to access the stomach. Needle position was confirmed within the stomach with aspiration of air and injection of a small amount of contrast. A stiff guidewire was advanced into the gastric lumen and under intermittent fluoroscopic guidance, the access needle was exchanged for a telescoping peel-away sheath, ultimately allowing placement of a 16 Fr balloon retention gastrostomy tube. The retention balloon was insufflated with a mixture of dilute saline and contrast and pulled taut against the anterior wall of the stomach. The external disc was cinched. Contrast injection confirms positioning within the stomach. Several spot radiographic images were obtained in various obliquities for documentation. The patient tolerated procedure well without immediate post procedural complication.  FINDINGS: After successful fluoroscopic guided placement, the gastrostomy tube is appropriately positioned with internal retention balloon against the ventral aspect of the gastric lumen.  IMPRESSION: Successful fluoroscopic insertion of a 16 Fr balloon retention gastrostomy tube.  The gastrostomy may be used immediately for medication administration and in 4 hrs for the initiation of feeds.  RECOMMENDATIONS: The patient will return to Vascular Interventional Radiology (VIR) for routine feeding tube evaluation and exchange in 6 months.  Thom Hall, MD  [facility]  [facility] Radiology  Electronically Signed   By: Thom Hall M.D.   On: 11/02/2024 09:05 IR  IMAGING GUIDED PORT INSERTION INDICATION: Chemotherapy.  History of head and neck cancer.  EXAM: IMPLANTED PORT A CATH PLACEMENT WITH ULTRASOUND AND FLUOROSCOPIC GUIDANCE  MEDICATIONS: None  ANESTHESIA/SEDATION: Moderate (conscious) sedation was employed during this procedure. A total of Versed  2 mg and  Fentanyl  100 mcg was administered intravenously.  Moderate Sedation Time: 20 minutes. The patient's level of consciousness and vital signs were monitored continuously by radiology nursing throughout the procedure under my direct supervision.  FLUOROSCOPY: Radiation Exposure Index and estimated peak skin dose (PSD);  Reference air kerma (RAK), 0.2 mGy.  COMPLICATIONS: None immediate.  PROCEDURE: The procedure, risks, benefits, and alternatives were explained to the patient. Questions regarding the procedure were encouraged and answered. The patient understands and consents to the procedure.  The RIGHT neck and chest were prepped with chlorhexidine  in a sterile fashion, and a sterile drape was applied covering the operative field. Maximum barrier sterile technique with sterile gowns and gloves were used for the procedure. A timeout was performed prior to the initiation of the procedure. Local anesthesia was provided with 1% lidocaine  with epinephrine .  After creating a small venotomy incision, a micropuncture kit was utilized to access the internal jugular vein under direct, real-time ultrasound guidance. Ultrasound image documentation was performed. The microwire was kinked to measure appropriate catheter length.  A subcutaneous port pocket was then created along the upper chest wall utilizing a combination of sharp and blunt dissection. The pocket was irrigated with sterile saline. A single lumen power injectable port was chosen for placement. The 8 Fr catheter was tunneled from the port pocket site to the venotomy incision. The port was placed in the pocket. The external catheter was trimmed to appropriate length. At the venotomy, an 8 Fr peel-away sheath was placed over a guidewire under fluoroscopic guidance. The catheter was then placed through the sheath and the sheath was removed. Final catheter positioning was confirmed and documented with a fluoroscopic spot  radiograph. The port was accessed with a Huber needle, aspirated and flushed with heparinized saline.  The port pocket incision was closed with interrupted 3-0 Vicryl suture then Dermabond was applied, including at the venotomy incision. Dressings were placed. The patient tolerated the procedure well without immediate post procedural complication.  IMPRESSION: Successful placement of a RIGHT internal jugular approach power injectable Port-A-Cath.  The tip of the catheter is positioned within the proximal RIGHT atrium. The catheter is ready for immediate use.  Thom Hall, MD  [facility]  [facility] Radiology  Electronically Signed   By: Thom Hall M.D.   On: 11/02/2024 09:03    CODE STATUS:  Code Status History     Date Active Date Inactive Code Status Order ID Comments User Context   08/21/2024 1658 08/22/2024 1914 Full Code [id number]  Jesus Oliphant, MD Inpatient   07/06/2024 1129 07/13/2024 2046 Full Code [id number]  Dennise Lavada POUR, MD ED   06/17/2024 1540 06/18/2024 1625 Full Code [id number]  Jesus Oliphant, MD Inpatient   12/21/2011 1452 12/24/2011 1530 Full Code [id number]  Ramonita Consuelo PARAS, RN Inpatient    Questions for Most Recent Historical Code Status (Order [id number])     Question Answer   By: Other            Orders Placed This Encounter  Procedures   CBC with Differential (Cancer Center Only)    Standing Status:   Future    Expected Date:   12/03/2024    Expiration Date:   12/03/2025  CMP (Cancer Center only)    Standing Status:   Future    Expected Date:   12/03/2024    Expiration Date:   12/03/2025     Future Appointments  Date Time Provider Department Center  11/25/2024 10:15 AM [facility] FLUSH [facility] None  11/25/2024 10:45 AM [facility] LINAC 4 [facility] None  11/26/2024 10:45 AM [facility] LINAC 4 [facility] None  11/26/2024 11:00 AM LINAC-SQUIRE [facility] None  11/26/2024 11:30 AM [facility] FLUSH  [facility] None  11/27/2024 10:45 AM [facility] LINAC 3 [facility] None  11/27/2024 11:30 AM [facility] FLUSH [facility] None  11/28/2024 10:45 AM [facility] LINAC 4 [facility] None  11/29/2024  9:15 AM [facility] FLUSH [facility] None  11/29/2024  9:45 AM Jhaniya Briski, MD [facility] None  11/29/2024 10:45 AM [facility] LINAC 3 [facility] None  11/29/2024 11:15 AM Ivonne Harlene RAMAN, RD [facility] None  12/02/2024 10:45 AM [facility] LINAC 3 [facility] None  12/02/2024  1:00 PM [facility]VASC 9 [facility] H&V  12/02/2024  2:00 PM [facility]-V[facility] 9 [facility] H&V  12/02/2024  2:30 PM [facility] PA [facility] H&V  12/03/2024 10:45 AM [facility] LINAC 4 [facility] None  12/03/2024 11:45 AM [facility] INFUSION [facility] None  12/04/2024 10:30 AM [facility] LINAC 4 [facility] None  12/05/2024 10:30 AM [facility] LINAC 4 [facility] None  12/06/2024 10:30 AM [facility] LINAC 4 [facility] None  12/06/2024 11:15 AM Ivonne Harlene RAMAN, RD [facility] None  12/09/2024 10:30 AM [facility] LINAC 4 [facility] None  12/10/2024 10:30 AM [facility] LINAC 4 [facility] None  12/11/2024 10:30 AM [facility] LINAC 4 [facility] None  12/12/2024 10:30 AM [facility] LINAC 4 [facility] None  12/13/2024 10:30 AM [facility] LINAC 4 [facility] None  12/13/2024 11:15 AM Ivonne Harlene RAMAN, RD [facility] None  12/16/2024 10:30 AM [facility] LINAC 4 [facility] None  12/20/2024 11:15 AM Ivonne Harlene RAMAN, RD [facility] None  12/30/2024  2:00 PM Jacelyn Lupita NOVAK, CCC-SLP [facility]  12/31/2024  3:10 PM Daneen Damien BROCKS, NP [facility] H&V  01/02/2025  3:00 PM Lanis Carbon, Blaire L, PT [facility] None  04/02/2025  2:00 PM Pleas, Dipti, MD [facility] [address]  05/02/2025 11:30 AM [facility] ANNUAL WELLNESS VISIT 1 [facility] [address]      This document was completed utilizing speech recognition software. Grammatical errors, random word insertions, pronoun errors, and incomplete sentences are an occasional consequence of this  system due to software limitations, ambient noise, and hardware issues. Any formal questions or concerns about the content, text or information contained within the body of this dictation should be directly addressed to the provider for clarification.   "

## 2024-11-22 NOTE — Telephone Encounter (Signed)
 I left voicemail for patient's daughter regarding injections scheduled for 11/25/2024 - 11/27/2024.

## 2024-11-22 NOTE — Telephone Encounter (Signed)
 I have attempted to reach patient twice and unable to leave a voicemail regarding injection appointments scheduled for 11/25/2024 - 11/27/2024.

## 2024-11-22 NOTE — Addendum Note (Signed)
 Addended by: Myleka Moncure E on: 11/22/2024 10:40 AM   Modules accepted: Orders

## 2024-11-22 NOTE — Assessment & Plan Note (Addendum)
 Please review oncology history for additional details and timeline of events.  Post-surgical status following tongue cancer with cervical lymph node involvement and extracapsular extension.   Neck dissection on October 15th with removal of 33 lymph nodes, 3 with cancer. High risk of recurrence due to extracapsular extension.   CT chest on 09/18/2024 showed mild mediastinal lymphadenopathy and metastatic involvement could not be excluded.  8 mm anterior left upper lobe lung nodule noted as well.  On her consultation with us  on 09/20/2024, request placed for staging PET-CT scan for further evaluation of chest lymphadenopathy.   On 10/01/2024, PET scan showed hypermetabolic left cervical lymph node, indicative of residual/recurrent disease. Hypermetabolic 4 mm parotid nodule, worrisome for malignancy. No abnormal hypermetabolism associated with the left posterior tongue, as questioned on CT neck 09/18/2024. Small right pleural effusion. Aortic atherosclerosis (ICD10-I70.0). Coronary artery calcification. Enlarged pulmonic trunk, indicative of pulmonary arterial hypertension.  She had a follow-up CT super D of chest on 10/11/2024 which showed  7 mm anterior segment left upper lobe nodule, unchanged from 09/18/2024 and too small for PET resolution on 10/01/2024. This could be followed with CT chest without contrast in 3 months, as clinically indicated, in this patient with a history of tongue cancer. Small bilateral pleural effusions.  On 10/14/2024, she underwent bronchoscopy and biopsy of the left upper lobe lung nodule and lymph nodes and pathology showed reactive cells without evidence of malignancy.  pT2, pN3B, M0 disease. Stage IVb disease.   Plan made to proceed with concurrent chemoradiation.   Given her CKD, she is not a candidate for cisplatin.  Hence plan made to proceed with weekly carboplatin /paclitaxel .  Carboplatin  dose reduced to AUC 1.5, considering her age and  comorbidities.  Received cycle 1 of carboplatin  and paclitaxel  on 11/04/2024.  Tolerating concurrent chemoradiation well with some expected side effects including mucositis, cytopenias  She is due for cycle 4 of carboplatin  and paclitaxel , currently scheduled on 11/25/2024.  Labs today however showed leukopenia with white count of 1000, ANC of 700, hemoglobin 7.8, platelet 131,000.  Given neutropenia, we will defer chemotherapy by at least a week.  She is receiving Zarxio  dose #3 today.  We will give her 3 more dose of Zarxio  next week from Monday to Wednesday.  RTC in 1 week for follow-up, repeat labs for possible resumption of chemotherapy.

## 2024-11-22 NOTE — Assessment & Plan Note (Addendum)
 She developed chemotherapy-induced leukopenia with a significant decrease in white blood cell count.    White count was 2100 last week and it is lower at 1000 today with ANC of 700.  This is prohibitive to proceed with chemotherapy on 11/25/2024.  We will hold chemotherapy and proceed with 3 more doses of Zarxio  injection daily from Monday to Wednesday next week.  - Scheduled booster injections to coincide with radiation appointments for convenience. - Advised her to wear a mask and avoid crowds to minimize infection risk.

## 2024-11-25 ENCOUNTER — Inpatient Hospital Stay

## 2024-11-25 ENCOUNTER — Other Ambulatory Visit: Payer: Self-pay

## 2024-11-25 ENCOUNTER — Inpatient Hospital Stay (HOSPITAL_COMMUNITY)
Admission: EM | Admit: 2024-11-25 | Discharge: 2024-11-29 | DRG: 308 | Disposition: A | Discharge status: Home / Self Care | Source: Ambulatory Visit | Attending: Internal Medicine | Admitting: Internal Medicine

## 2024-11-25 ENCOUNTER — Emergency Department (HOSPITAL_COMMUNITY)

## 2024-11-25 ENCOUNTER — Other Ambulatory Visit: Payer: Self-pay | Admitting: Oncology

## 2024-11-25 ENCOUNTER — Ambulatory Visit

## 2024-11-25 VITALS — BP 97/68 | HR 145 | Temp 98.4°F | Resp 20

## 2024-11-25 VITALS — BP 148/66 | HR 147 | Temp 98.0°F | Resp 20

## 2024-11-25 DIAGNOSIS — Z7901 Long term (current) use of anticoagulants: Secondary | ICD-10-CM

## 2024-11-25 DIAGNOSIS — N179 Acute kidney failure, unspecified: Secondary | ICD-10-CM | POA: Diagnosis present

## 2024-11-25 DIAGNOSIS — D701 Agranulocytosis secondary to cancer chemotherapy: Secondary | ICD-10-CM

## 2024-11-25 DIAGNOSIS — J9811 Atelectasis: Secondary | ICD-10-CM | POA: Diagnosis present

## 2024-11-25 DIAGNOSIS — T447X6A Underdosing of beta-adrenoreceptor antagonists, initial encounter: Secondary | ICD-10-CM | POA: Diagnosis present

## 2024-11-25 DIAGNOSIS — E43 Unspecified severe protein-calorie malnutrition: Secondary | ICD-10-CM | POA: Diagnosis present

## 2024-11-25 DIAGNOSIS — Z95828 Presence of other vascular implants and grafts: Secondary | ICD-10-CM

## 2024-11-25 DIAGNOSIS — Z888 Allergy status to other drugs, medicaments and biological substances status: Secondary | ICD-10-CM

## 2024-11-25 DIAGNOSIS — D61818 Other pancytopenia: Secondary | ICD-10-CM | POA: Diagnosis present

## 2024-11-25 DIAGNOSIS — Y848 Other medical procedures as the cause of abnormal reaction of the patient, or of later complication, without mention of misadventure at the time of the procedure: Secondary | ICD-10-CM | POA: Diagnosis present

## 2024-11-25 DIAGNOSIS — D6959 Other secondary thrombocytopenia: Secondary | ICD-10-CM | POA: Diagnosis present

## 2024-11-25 DIAGNOSIS — D61810 Antineoplastic chemotherapy induced pancytopenia: Secondary | ICD-10-CM | POA: Diagnosis present

## 2024-11-25 DIAGNOSIS — I70262 Atherosclerosis of native arteries of extremities with gangrene, left leg: Secondary | ICD-10-CM | POA: Diagnosis present

## 2024-11-25 DIAGNOSIS — Z6826 Body mass index (BMI) 26.0-26.9, adult: Secondary | ICD-10-CM

## 2024-11-25 DIAGNOSIS — I4891 Unspecified atrial fibrillation: Principal | ICD-10-CM | POA: Diagnosis present

## 2024-11-25 DIAGNOSIS — D709 Neutropenia, unspecified: Secondary | ICD-10-CM | POA: Diagnosis present

## 2024-11-25 DIAGNOSIS — I959 Hypotension, unspecified: Secondary | ICD-10-CM | POA: Diagnosis not present

## 2024-11-25 DIAGNOSIS — K148 Other diseases of tongue: Secondary | ICD-10-CM | POA: Diagnosis present

## 2024-11-25 DIAGNOSIS — Z7902 Long term (current) use of antithrombotics/antiplatelets: Secondary | ICD-10-CM

## 2024-11-25 DIAGNOSIS — C023 Malignant neoplasm of anterior two-thirds of tongue, part unspecified: Secondary | ICD-10-CM | POA: Diagnosis present

## 2024-11-25 DIAGNOSIS — J189 Pneumonia, unspecified organism: Secondary | ICD-10-CM | POA: Diagnosis present

## 2024-11-25 DIAGNOSIS — Z79899 Other long term (current) drug therapy: Secondary | ICD-10-CM

## 2024-11-25 DIAGNOSIS — I480 Paroxysmal atrial fibrillation: Principal | ICD-10-CM | POA: Diagnosis present

## 2024-11-25 DIAGNOSIS — Z9889 Other specified postprocedural states: Secondary | ICD-10-CM

## 2024-11-25 DIAGNOSIS — R197 Diarrhea, unspecified: Secondary | ICD-10-CM | POA: Diagnosis present

## 2024-11-25 DIAGNOSIS — R911 Solitary pulmonary nodule: Secondary | ICD-10-CM | POA: Diagnosis present

## 2024-11-25 DIAGNOSIS — T45516A Underdosing of anticoagulants, initial encounter: Secondary | ICD-10-CM | POA: Diagnosis present

## 2024-11-25 DIAGNOSIS — J984 Other disorders of lung: Secondary | ICD-10-CM | POA: Diagnosis present

## 2024-11-25 DIAGNOSIS — Z8249 Family history of ischemic heart disease and other diseases of the circulatory system: Secondary | ICD-10-CM

## 2024-11-25 DIAGNOSIS — H4010X0 Unspecified open-angle glaucoma, stage unspecified: Secondary | ICD-10-CM | POA: Diagnosis present

## 2024-11-25 DIAGNOSIS — T451X5A Adverse effect of antineoplastic and immunosuppressive drugs, initial encounter: Secondary | ICD-10-CM | POA: Diagnosis present

## 2024-11-25 DIAGNOSIS — Z9841 Cataract extraction status, right eye: Secondary | ICD-10-CM

## 2024-11-25 DIAGNOSIS — Z9842 Cataract extraction status, left eye: Secondary | ICD-10-CM

## 2024-11-25 DIAGNOSIS — K9423 Gastrostomy malfunction: Secondary | ICD-10-CM | POA: Diagnosis present

## 2024-11-25 DIAGNOSIS — I129 Hypertensive chronic kidney disease with stage 1 through stage 4 chronic kidney disease, or unspecified chronic kidney disease: Secondary | ICD-10-CM | POA: Diagnosis present

## 2024-11-25 DIAGNOSIS — Z96643 Presence of artificial hip joint, bilateral: Secondary | ICD-10-CM | POA: Diagnosis present

## 2024-11-25 DIAGNOSIS — Y731 Therapeutic (nonsurgical) and rehabilitative gastroenterology and urology devices associated with adverse incidents: Secondary | ICD-10-CM | POA: Diagnosis present

## 2024-11-25 DIAGNOSIS — J439 Emphysema, unspecified: Secondary | ICD-10-CM | POA: Diagnosis present

## 2024-11-25 DIAGNOSIS — K1239 Other oral mucositis (ulcerative): Secondary | ICD-10-CM | POA: Diagnosis present

## 2024-11-25 DIAGNOSIS — Z9851 Tubal ligation status: Secondary | ICD-10-CM

## 2024-11-25 DIAGNOSIS — G893 Neoplasm related pain (acute) (chronic): Secondary | ICD-10-CM | POA: Diagnosis present

## 2024-11-25 DIAGNOSIS — N1832 Chronic kidney disease, stage 3b: Secondary | ICD-10-CM | POA: Diagnosis present

## 2024-11-25 DIAGNOSIS — R0902 Hypoxemia: Secondary | ICD-10-CM | POA: Diagnosis not present

## 2024-11-25 DIAGNOSIS — Z91148 Patient's other noncompliance with medication regimen for other reason: Secondary | ICD-10-CM

## 2024-11-25 DIAGNOSIS — Z83719 Family history of colon polyps, unspecified: Secondary | ICD-10-CM

## 2024-11-25 DIAGNOSIS — E785 Hyperlipidemia, unspecified: Secondary | ICD-10-CM | POA: Diagnosis present

## 2024-11-25 DIAGNOSIS — Z87891 Personal history of nicotine dependence: Secondary | ICD-10-CM

## 2024-11-25 DIAGNOSIS — D631 Anemia in chronic kidney disease: Secondary | ICD-10-CM | POA: Diagnosis present

## 2024-11-25 DIAGNOSIS — Z1152 Encounter for screening for COVID-19: Secondary | ICD-10-CM

## 2024-11-25 DIAGNOSIS — D649 Anemia, unspecified: Secondary | ICD-10-CM

## 2024-11-25 LAB — CBC WITH DIFFERENTIAL/PLATELET
Abs Immature Granulocytes: 0 K/uL (ref 0.00–0.07)
Basophils Absolute: 0 K/uL (ref 0.0–0.1)
Basophils Relative: 2 %
Eosinophils Absolute: 0 K/uL (ref 0.0–0.5)
Eosinophils Relative: 0 %
HCT: 20 % — ABNORMAL LOW (ref 36.0–46.0)
Hemoglobin: 6.4 g/dL — CL (ref 12.0–15.0)
Immature Granulocytes: 0 %
Lymphocytes Relative: 13 %
Lymphs Abs: 0.2 K/uL — ABNORMAL LOW (ref 0.7–4.0)
MCH: 30.8 pg (ref 26.0–34.0)
MCHC: 32 g/dL (ref 30.0–36.0)
MCV: 96.2 fL (ref 80.0–100.0)
Monocytes Absolute: 0.6 K/uL (ref 0.1–1.0)
Monocytes Relative: 47 %
Neutro Abs: 0.5 K/uL — ABNORMAL LOW (ref 1.7–7.7)
Neutrophils Relative %: 38 %
Platelets: 84 K/uL — ABNORMAL LOW (ref 150–400)
RBC: 2.08 MIL/uL — ABNORMAL LOW (ref 3.87–5.11)
RDW: 15.8 % — ABNORMAL HIGH (ref 11.5–15.5)
Smear Review: NORMAL
WBC: 1.3 K/uL — CL (ref 4.0–10.5)
nRBC: 0 % (ref 0.0–0.2)

## 2024-11-25 LAB — BASIC METABOLIC PANEL WITH GFR
Anion gap: 13 (ref 5–15)
BUN: 47 mg/dL — ABNORMAL HIGH (ref 8–23)
CO2: 20 mmol/L — ABNORMAL LOW (ref 22–32)
Calcium: 8.9 mg/dL (ref 8.9–10.3)
Chloride: 106 mmol/L (ref 98–111)
Creatinine, Ser: 1.78 mg/dL — ABNORMAL HIGH (ref 0.44–1.00)
GFR, Estimated: 29 mL/min — ABNORMAL LOW
Glucose, Bld: 110 mg/dL — ABNORMAL HIGH (ref 70–99)
Potassium: 4.7 mmol/L (ref 3.5–5.1)
Sodium: 139 mmol/L (ref 135–145)

## 2024-11-25 LAB — ABO/RH: ABO/RH(D): O POS

## 2024-11-25 LAB — MRSA NEXT GEN BY PCR, NASAL: MRSA by PCR Next Gen: NOT DETECTED

## 2024-11-25 LAB — PREPARE RBC (CROSSMATCH)

## 2024-11-25 LAB — GLUCOSE, CAPILLARY: Glucose-Capillary: 99 mg/dL (ref 70–99)

## 2024-11-25 MED ORDER — METOPROLOL TARTRATE 25 MG PO TABS
100.0000 mg | ORAL_TABLET | Freq: Two times a day (BID) | ORAL | Status: DC
  Administered 2024-11-26 – 2024-11-27 (×3): 100 mg
  Filled 2024-11-25 (×4): qty 4

## 2024-11-25 MED ORDER — SODIUM CHLORIDE 0.9 % IV BOLUS
1000.0000 mL | Freq: Once | INTRAVENOUS | Status: AC
  Administered 2024-11-25: 1000 mL via INTRAVENOUS

## 2024-11-25 MED ORDER — SODIUM CHLORIDE 0.9% IV SOLUTION: Freq: Once | INTRAVENOUS | Status: DC

## 2024-11-25 MED ORDER — LIDOCAINE VISCOUS HCL 2 % MT SOLN
15.0000 mL | Freq: Four times a day (QID) | OROMUCOSAL | Status: DC | PRN
  Administered 2024-11-25: 15 mL via OROMUCOSAL
  Filled 2024-11-25: qty 15

## 2024-11-25 MED ORDER — METOPROLOL TARTRATE 5 MG/5ML IV SOLN
5.0000 mg | INTRAVENOUS | Status: DC | PRN
  Administered 2024-11-25 (×2): 5 mg via INTRAVENOUS
  Filled 2024-11-25 (×2): qty 5

## 2024-11-25 MED ORDER — ACETAMINOPHEN 325 MG PO TABS
650.0000 mg | ORAL_TABLET | Freq: Four times a day (QID) | ORAL | Status: DC | PRN
  Administered 2024-11-26: 650 mg via ORAL
  Filled 2024-11-25: qty 2

## 2024-11-25 MED ORDER — NYSTATIN 100000 UNIT/ML MT SUSP: 5.0000 mL | Freq: Four times a day (QID) | OROMUCOSAL | Status: DC | PRN

## 2024-11-25 MED ORDER — PROCHLORPERAZINE MALEATE 10 MG PO TABS: 10.0000 mg | ORAL_TABLET | Freq: Four times a day (QID) | ORAL | Status: DC | PRN

## 2024-11-25 MED ORDER — ACETAMINOPHEN 650 MG RE SUPP: 650.0000 mg | Freq: Four times a day (QID) | RECTAL | Status: DC | PRN

## 2024-11-25 MED ORDER — MORPHINE SULFATE (PF) 2 MG/ML IV SOLN
1.0000 mg | INTRAVENOUS | Status: DC | PRN
  Filled 2024-11-25: qty 1

## 2024-11-25 MED ORDER — TRAMADOL HCL 50 MG PO TABS: 50.0000 mg | ORAL_TABLET | Freq: Two times a day (BID) | ORAL | Status: DC | PRN

## 2024-11-25 MED ORDER — LIDOCAINE-PRILOCAINE 2.5-2.5 % EX CREA: 1.0000 | TOPICAL_CREAM | CUTANEOUS | Status: DC | PRN

## 2024-11-25 MED ORDER — LACTATED RINGERS IV SOLN: INTRAVENOUS | Status: DC

## 2024-11-25 MED ORDER — AMLODIPINE BESYLATE 10 MG PO TABS: 10.0000 mg | ORAL_TABLET | Freq: Every day | ORAL | Status: DC

## 2024-11-25 MED ORDER — OXYCODONE HCL 5 MG PO TABS
5.0000 mg | ORAL_TABLET | Freq: Four times a day (QID) | ORAL | Status: DC | PRN
  Filled 2024-11-25: qty 1

## 2024-11-25 MED ORDER — FILGRASTIM-SNDZ 300 MCG/0.5ML IJ SOSY
300.0000 ug | PREFILLED_SYRINGE | Freq: Every day | INTRAMUSCULAR | Status: DC
  Filled 2024-11-25: qty 0.5

## 2024-11-25 MED ORDER — ENSURE PLUS HIGH PROTEIN PO LIQD
237.0000 mL | Freq: Every day | ORAL | Status: DC
  Administered 2024-11-26: 237 mL
  Filled 2024-11-25 (×2): qty 237

## 2024-11-25 MED ORDER — FILGRASTIM-SNDZ 300 MCG/0.5ML IJ SOSY
300.0000 ug | PREFILLED_SYRINGE | Freq: Every day | INTRAMUSCULAR | Status: DC
  Administered 2024-11-25: 300 ug via SUBCUTANEOUS

## 2024-11-25 MED ORDER — DILTIAZEM HCL-DEXTROSE 125-5 MG/125ML-% IV SOLN (PREMIX)
5.0000 mg/h | INTRAVENOUS | Status: DC
  Administered 2024-11-25: 5 mg/h via INTRAVENOUS
  Administered 2024-11-26: 10 mg/h via INTRAVENOUS
  Filled 2024-11-25 (×2): qty 125

## 2024-11-25 MED ORDER — SODIUM CHLORIDE 0.9% FLUSH
10.0000 mL | Freq: Two times a day (BID) | INTRAVENOUS | Status: DC
  Administered 2024-11-25 – 2024-11-26 (×2): 10 mL
  Administered 2024-11-26: 40 mL
  Administered 2024-11-27 (×2): 10 mL
  Administered 2024-11-28: 20 mL
  Administered 2024-11-28 – 2024-11-29 (×2): 10 mL

## 2024-11-25 MED ORDER — NUTREN 1.5 EN LIQD: Freq: Every day | ENTERAL | Status: DC

## 2024-11-25 MED ORDER — APIXABAN 5 MG PO TABS: 5.0000 mg | ORAL_TABLET | Freq: Two times a day (BID) | ORAL | Status: DC

## 2024-11-25 MED ORDER — METOPROLOL SUCCINATE ER 25 MG PO TB24: 200.0000 mg | ORAL_TABLET | Freq: Every day | ORAL | Status: DC

## 2024-11-25 MED ORDER — CHLORHEXIDINE GLUCONATE CLOTH 2 % EX PADS
6.0000 | MEDICATED_PAD | Freq: Every day | CUTANEOUS | Status: DC
  Administered 2024-11-25 – 2024-11-28 (×4): 6 via TOPICAL

## 2024-11-25 MED ORDER — DILTIAZEM LOAD VIA INFUSION
10.0000 mg | Freq: Once | INTRAVENOUS | Status: AC
  Administered 2024-11-25: 10 mg via INTRAVENOUS
  Filled 2024-11-25: qty 10

## 2024-11-25 MED ORDER — MAGIC MOUTHWASH W/LIDOCAINE
5.0000 mL | Freq: Four times a day (QID) | ORAL | Status: DC | PRN
  Filled 2024-11-25: qty 5

## 2024-11-25 MED ORDER — SODIUM CHLORIDE 0.9% FLUSH: 10.0000 mL | INTRAVENOUS | Status: DC | PRN

## 2024-11-25 MED ORDER — AZITHROMYCIN 250 MG PO TABS
250.0000 mg | ORAL_TABLET | Freq: Every day | ORAL | Status: AC
  Administered 2024-11-26 – 2024-11-27 (×2): 250 mg
  Filled 2024-11-25 (×2): qty 1

## 2024-11-25 MED ORDER — MORPHINE SULFATE 10 MG/5ML PO SOLN
5.0000 mg | ORAL | Status: DC | PRN
  Administered 2024-11-25 – 2024-11-26 (×2): 5 mg
  Filled 2024-11-25 (×2): qty 5

## 2024-11-25 MED ORDER — SODIUM CHLORIDE 0.9 % IV SOLN: Freq: Once | INTRAVENOUS | Status: AC

## 2024-11-25 MED ORDER — AZITHROMYCIN 250 MG PO TABS: 250.0000 mg | ORAL_TABLET | Freq: Every day | ORAL | Status: DC

## 2024-11-25 NOTE — ED Provider Notes (Signed)
 " [place] EMERGENCY DEPARTMENT AT [facility] Provider Note   CSN: [id number] Arrival date & time: 11/25/24  1218     Patient presents with: Irregular Heart Beat (Afib RVR from [facility] center)   Taylor Bush is a 79 y.o. female.   This is a 79 year old female currently receiving treatment for tongue cancer, G-tube dependent, was receiving chemotherapy today when infusion staff noticed that her heart rate was elevated in the 140s.  They did an EKG which shows atrial fibrillation.  Patient does have a history of A-fib, however she has not taken her Eliquis  or metoprolol  in 3 days because she was unsure of how to use her G-tube.        Prior to Admission medications  Medication Sig Start Date End Date Taking? Authorizing Provider  amLODipine  (NORVASC ) 10 MG tablet TAKE 1 TABLET BY MOUTH DAILY 11/08/24   Copland, Jacques, MD  apixaban  (ELIQUIS ) 5 MG TABS tablet Take 1 tablet (5 mg total) by mouth 2 (two) times daily. 08/24/24   Henry Manuelita NOVAK, NP  atorvastatin  (LIPITOR ) 80 MG tablet Take 1 tablet (80 mg total) by mouth daily. 07/14/24   Akula, Vijaya, MD  azithromycin  (ZITHROMAX  Z-PAK) 250 MG tablet Take 2 tablets by mouth on day 1, then 1 tablet daily until complete 11/22/24   Pasam, Avinash, MD  clopidogrel  (PLAVIX ) 75 MG tablet Take 1 tablet (75 mg total) by mouth daily with breakfast. 07/14/24   Cherlyn Labella, MD  Faricimab -svoa (VABYSMO  IZ) 1 Dose by Intravitreal route every 6 (six) weeks.    [provider]  lidocaine  (XYLOCAINE ) 2 % solution Patient: Mix 1 part 2% viscous lidocaine , 1 part water . Swish & swallow 10mL of diluted mixture 30 minutes before meals and at bedtime, up to 4 times daily 11/11/24   Izell Domino, MD  lidocaine -prilocaine  (EMLA ) cream Apply to affected area once 10/28/24   Pasam, Avinash, MD  lisinopril  (ZESTRIL ) 20 MG tablet TAKE 1 TABLET BY MOUTH DAILY 11/08/24   Copland, Jacques, MD  magic mouthwash (nystatin , lidocaine , diphenhydrAMINE , alum &  mag hydroxide) suspension Take 5 mLs by mouth 4 (four) times daily as needed for mouth pain. Suspension contains equal amounts of Maalox Extra Strength, nystatin , diphenhydramine  and lidocaine . 1:1:1:1 ratio please. 11/18/24   Pasam, Chinita, MD  metoprolol  (TOPROL -XL) 200 MG 24 hr tablet TAKE 1 TABLET BY MOUTH DAILY 11/08/24   Copland, Jacques, MD  Nutritional Supplements (NUTREN 1.5) LIQD Nutren 1.5 - give one 250 ml carton 5x/day via PEG. Flush tube with 60 ml water  before and after each bolus. Give additional 120 ml free water  TID to meet hydration. Provides 1875 kcal, 85 g, 955 ml water  (1915 ml total water ). 1250 ml/day meets 100% DRI 11/22/24   Pasam, Avinash, MD  ondansetron  (ZOFRAN ) 8 MG tablet Take 1 tablet (8 mg total) by mouth every 8 (eight) hours as needed for nausea or vomiting. Start on the third day after chemotherapy. 10/18/24   Pasam, Chinita, MD  ondansetron  (ZOFRAN -ODT) 4 MG disintegrating tablet Take 1 tablet (4 mg total) by mouth every 8 (eight) hours as needed for nausea or vomiting. 06/18/24   Jesus Oliphant, MD  oxyCODONE  (OXY IR/ROXICODONE ) 5 MG immediate release tablet Take 1 tablet (5 mg total) by mouth every 6 (six) hours as needed for severe pain (pain score 7-10). 11/18/24   Pasam, Avinash, MD  prochlorperazine  (COMPAZINE ) 10 MG tablet Take 1 tablet (10 mg total) by mouth every 6 (six) hours as needed for nausea  or vomiting. 10/18/24   Pasam, Chinita, MD  sodium fluoride (FLUORISHIELD) 1.1 % GEL dental gel Apply thin layer of fluoride gel inside the trays and wear them for 20 minutes nightly. Start after completion of HNRT 09/23/24   [provider]  traMADol  (ULTRAM ) 50 MG tablet Take 1 tablet (50 mg total) by mouth every 8 (eight) hours as needed. 08/24/24   Copland, Jacques, MD    Allergies: Statins    Review of Systems  Updated Vital Signs BP 123/70 (BP Location: Left Arm)   Pulse (!) 113   Temp 98.8 F (37.1 C) (Oral)   Resp 17   Ht 5' (1.524 m)   Wt 57.9  kg   SpO2 97%   BMI 24.94 kg/m   Physical Exam Vitals and nursing note reviewed.  HENT:     Head: Normocephalic.  Eyes:     Extraocular Movements: Extraocular movements intact.  Cardiovascular:     Rate and Rhythm: Tachycardia present. Rhythm irregular.  Abdominal:     General: Abdomen is flat.     Palpations: Abdomen is soft.  Musculoskeletal:        General: Normal range of motion.  Skin:    General: Skin is warm.  Neurological:     General: No focal deficit present.     Mental Status: She is alert.     (all labs ordered are listed, but only abnormal results are displayed) Labs Reviewed  BASIC METABOLIC PANEL WITH GFR - Abnormal; Notable for the following components:      Result Value   CO2 20 (*)    Glucose, Bld 110 (*)    BUN 47 (*)    Creatinine, Ser 1.78 (*)    GFR, Estimated 29 (*)    All other components within normal limits  CBC WITH DIFFERENTIAL/PLATELET - Abnormal; Notable for the following components:   WBC 1.3 (*)    RBC 2.08 (*)    Hemoglobin 6.4 (*)    HCT 20.0 (*)    RDW 15.8 (*)    Platelets 84 (*)    All other components within normal limits  TYPE AND SCREEN  ABO/RH  PREPARE RBC (CROSSMATCH)    EKG: None  Radiology: DG Chest Portable 1 View Result Date: 11/25/2024 EXAM: 1 VIEW(S) XRAY OF THE CHEST 11/25/2024 01:27:00 PM COMPARISON: 10/14/2024 CLINICAL HISTORY: Cough. FINDINGS: LINES, TUBES AND DEVICES: Power injectable Right chest wall Port-A-Cath in place with tip overlying the superior caval junction. LUNGS AND PLEURA: Left upper lobe nodule with adjacent fiducial marker. Reduced density around the left upper lobe fiducial compared to previous. Linear subsegmental atelectasis or scarring at the left lung base. No pleural effusion. No pneumothorax. HEART AND MEDIASTINUM: Mild cardiomegaly. Leftward rotation observed. Aortic calcification. BONES AND SOFT TISSUES: Chronic deformity of the right posterolateral 9th rib. IMPRESSION: 1. Decreased  density surrounding the left upper lobe  fiducial marker. 2. Linear subsegmental atelectasis or scarring at the left lung base. 3. Mild cardiomegaly. 4. Right chest wall port-a-cath with tip at the superior cavoatrial junction. 5. Aortic atherosclerotic calcification. 6. Chronic deformity of the right posterolateral ninth rib. Electronically signed by: Ryan Salvage MD 11/25/2024 02:14 PM EST RP Workstation: [id number]     .Critical Care  Performed by: Mannie Fairy DASEN, DO Authorized by: Mannie Fairy DASEN, DO   Critical care provider statement:    Critical care time (minutes):  35   Critical care was time spent personally by me on the following activities:  Development of treatment  plan with patient or surrogate, discussions with consultants, evaluation of patient's response to treatment, examination of patient, ordering and review of laboratory studies, ordering and review of radiographic studies, ordering and performing treatments and interventions, pulse oximetry, re-evaluation of patient's condition and review of old charts    Medications Ordered in the ED  metoprolol  tartrate (LOPRESSOR ) injection 5 mg (5 mg Intravenous Given 11/25/24 1312)  diltiazem  (CARDIZEM ) 1 mg/mL load via infusion 10 mg (has no administration in time range)    And  diltiazem  (CARDIZEM ) 125 mg in dextrose  5% 125 mL (1 mg/mL) infusion (has no administration in time range)  0.9 %  sodium chloride  infusion (Manually program via Guardrails IV Fluids) (has no administration in time range)  sodium chloride  0.9 % bolus 1,000 mL (0 mLs Intravenous Stopped 11/25/24 1406)                                    Medical Decision Making 79 year old female coming from the cancer center for elevated heart rate.  Differential diagnoses include atrial fibrillation with rapid ventricular rate, medication noncompliance, consider electrolyte normalities, consider underlying cause.  Plan-patient with a heart rate in the 120s to  140s.  Will provide her with some IV metoprolol  to see if we can control her rate.  Blood work ordered.  Daughter at bedside helps provide history.  Reassessment 3 PM-patient did not respond as well to metoprolol  as I would hope, heart rate remained in the 120s to 140s.  Reviewed her most recent echo, she has an EF 55 to 60%.  Will start the patient on diltiazem  and diltiazem  infusion.  She also has anemia and leukopenia, likely secondary to her chemotherapy.  She denies any dark or tarry stools.  Will admit to hospitalist for atrial fibrillation with RVR, anemia.  Blood transfusion ordered.  Amount and/or Complexity of Data Reviewed Labs: ordered. Radiology: ordered.  Risk Prescription drug management. Decision regarding hospitalization.        Final diagnoses:  Atrial fibrillation with RVR (HCC)  Anemia, unspecified type    ED Discharge Orders     None          Mannie Fairy DASEN, DO 11/25/24 1505  "

## 2024-11-25 NOTE — ED Notes (Signed)
 NS bolus begun, 5 mg IV Lopressor  given.  Pt tolerated well.  HR decreased to 110, BP 107/68.  Pt coughing up thick yellow sputum.  MD notified.

## 2024-11-25 NOTE — Progress Notes (Signed)
 Patient here for Zarxio  injection.  VS 98.4 97/68 145 20 100% on RA.  C/O dizziness and decrease intake of water .  Secure chatted with Dr. Autumn and Libb/RN charge nurse.  Patient will be scheduled today for Bolus fluids per Dr. Autumn.

## 2024-11-25 NOTE — Progress Notes (Signed)
 Patient adding for IV fluid and dizziness, heart rate in the 140s to 150s, EKG confirmed A-fib with RVR, asked patient about her medication, she stated she have not taken her A-fib medication for 3days, Dr. Autumn notified directed to take the patient to ED, patient transported to the ED via wheelchair, Patient alert and orientated, family with patient during transportation to the ED.

## 2024-11-25 NOTE — ED Notes (Signed)
 Second dose lopressor  given

## 2024-11-25 NOTE — ED Triage Notes (Signed)
 Pt came over from cancer center HR in 140s 500 fluid given. Hasn't taken meds in 3 days because pt is NPO and wasn't shown how to take meds through tubes. Chemo- Monday had Taxol  and Carboplatin  11/18/2024. Radiation every day.

## 2024-11-25 NOTE — H&P (Signed)
 " History and Physical    Patient: Taylor Bush FMW:[id number] DOB: 1946-03-11 DOA: 11/25/2024 DOS: the patient was seen and examined on 11/25/2024 PCP: Watt Mirza, MD  Patient coming from: Home  Chief Complaint:  Chief Complaint  Patient presents with   Irregular Heart Beat    Afib RVR from [facility]   HPI: Taylor Bush is a 79 y.o. female with medical history significant of paroxysmal afib on eliquis , CKD3, HTN, PVD, squamous of carcinoma of the tongue status post left partial glossectomy on chemo therapy and radiation with chemotherapy induced thrombocytopenia, anemia, neutropenia, G-tube dependant, sent to the ED from cancer center after she was noted to be in afib RVR.  She admits she has not taken her medications (including eliquis  and metoprolol ) in 3 days because she was never instructed on how to administer them through her G-tube.  ROS positive for non-bloody diarrhea (which has been present since she started tube feeds), and sore throat which is chronic 2/2 mucositis. She reports new unproductive cough, however per recent documentation this is not new.  She denies chest pain, dizziness, palpitations, SOB.   In the ED, she was in afib RVR rate up to 150. BP stable. HGB 6.4, WBC 1.3. 1 unit PRBC ordered to be transfused.  Cr appears near her baseline.  She was given  10mg  IV metoprolol  without significant improvement in her rate, started on diltiazem  drip and hospitalist consulted for admission.   Review of Systems: Review of Systems  Constitutional:  Negative for fever and weight loss.  Respiratory:  Positive for cough and sputum production. Negative for shortness of breath and wheezing.   Cardiovascular:  Negative for chest pain, palpitations and leg swelling.  Gastrointestinal:  Positive for abdominal pain and diarrhea. Negative for blood in stool, constipation and vomiting.  Genitourinary:  Negative for dysuria and frequency.  Skin:  Negative for itching and rash.   Neurological:  Negative for dizziness, focal weakness, loss of consciousness and weakness.    Past Medical History:  Diagnosis Date   Allergic rhinitis    Anemia in chronic kidney disease (CKD)    B12 deficiency 09/13/2021   Blood transfusion 2012   and 2013   Chronic low back pain    CKD (chronic kidney disease) stage 3, GFR 30-59 ml/min (HCC) 11/29/2013   HLD (hyperlipidemia)    HTN (hypertension)    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) 09/20/2024   Open-angle glaucoma    PAD (peripheral artery disease)    with intermittent claudication, ABI 0.72 on Rt and 0.73 on L when assessed in 2010.    Paroxysmal atrial fibrillation (HCC) 08/21/2024   Past Surgical History:  Procedure Laterality Date   ABDOMINAL AORTOGRAM W/LOWER EXTREMITY N/A 07/09/2024   Procedure: ABDOMINAL AORTOGRAM W/LOWER EXTREMITY;  Surgeon: Serene Gaile ORN, MD;  Location: [facility];  Service: Cardiovascular;  Laterality: N/A;   abnormal vascular studies  05/07/2009   ABI's 70, R SFA > 50%^ blockage    APPLICATION OF WOUND VAC Left 07/12/2024   Procedure: APPLICATION, WOUND VAC;  Surgeon: Harden Jerona GAILS, MD;  Location: [facility];  Service: Orthopedics;  Laterality: Left;   CATARACT EXTRACTION, BILATERAL  2011   EXCISION OF TONGUE LESION WITH LASER N/A 08/31/2015   Procedure: BIOPSY AND CO2 LASER OF TONGUE LESION ;  Surgeon: Ida Loader, MD;  Location: [facility];  Service: ENT;  Laterality: N/A;   EXCISION OF TONGUE LESION WITH LASER Left 06/29/2020  Procedure: EXCISION OF TONGUE LESION/CO2 laser w/biopsy;  Surgeon: Jesus Oliphant, MD;  Location: [facility];  Service: ENT;  Laterality: Left;   EXCISION OF TONGUE LESION WITH LASER Bilateral 10/02/2023   Procedure: ORAL BIOPSY AND CO2 LASER ABLATATION OF TONGUE LESION;  Surgeon: Jesus Oliphant, MD;  Location: [facility];  Service: ENT;  Laterality: Bilateral;   EXCISION ORAL LESION WITH CO2 LASER N/A 12/08/2021    Procedure: EXCISION OF ORAL ORAL CAVITY LESION WITH CO2 LASER;  Surgeon: Jesus Oliphant, MD;  Location: [facility];  Service: ENT;  Laterality: N/A;   EYE SURGERY     GLOSSECTOMY, PARTIAL Left 06/17/2024   Procedure: GLOSSECTOMY, PARTIAL;  Surgeon: Jesus Oliphant, MD;  Location: [facility] OR;  Service: ENT;  Laterality: Left;   INCISION AND DRAINAGE OF WOUND Left 07/12/2024   Procedure: IRRIGATION AND DEBRIDEMENT WOUND;  Surgeon: Harden Jerona GAILS, MD;  Location: [facility] OR;  Service: Orthopedics;  Laterality: Left;  LEFT FOOT DEBRIDEMENT   IR GASTROSTOMY TUBE MOD SED  11/01/2024   IR IMAGING GUIDED PORT INSERTION  11/01/2024   JOINT REPLACEMENT     LOWER EXTREMITY ANGIOGRAPHY  07/09/2024   Procedure: Lower Extremity Angiography;  Surgeon: Serene Gaile ORN, MD;  Location: [facility];  Service: Cardiovascular;;   LOWER EXTREMITY INTERVENTION  07/09/2024   Procedure: LOWER EXTREMITY INTERVENTION;  Surgeon: Serene Gaile ORN, MD;  Location: [facility];  Service: Cardiovascular;;   OTHER SURGICAL HISTORY     ganglion cyst removed left wrist    PERIPHERAL INTRAVASCULAR LITHOTRIPSY  07/09/2024   Procedure: PERIPHERAL INTRAVASCULAR LITHOTRIPSY;  Surgeon: Serene Gaile ORN, MD;  Location: [facility];  Service: Cardiovascular;;   PERIPHERAL VASCULAR ATHERECTOMY  07/09/2024   Procedure: PERIPHERAL VASCULAR ATHERECTOMY;  Surgeon: Serene Gaile ORN, MD;  Location: [facility];  Service: Cardiovascular;;   RADICAL NECK DISSECTION Left 08/21/2024   Procedure: DISSECTION, NECK, RADICAL;  Surgeon: Jesus Oliphant, MD;  Location: [facility] OR;  Service: ENT;  Laterality: Left;   TOTAL HIP ARTHROPLASTY  10/03/2011   Procedure: TOTAL HIP ARTHROPLASTY;  Surgeon: Dempsey GAILS Moan;  Location: [facility];  Service: Orthopedics;  Laterality: Right;   TOTAL HIP ARTHROPLASTY  12/21/2011   Procedure: TOTAL HIP ARTHROPLASTY;  Surgeon: Dempsey GAILS Moan, MD;  Location: [facility];  Service: Orthopedics;  Laterality: Left;    TUBAL LIGATION     VIDEO BRONCHOSCOPY WITH ENDOBRONCHIAL NAVIGATION Left 10/14/2024   Procedure: VIDEO BRONCHOSCOPY WITH ENDOBRONCHIAL NAVIGATION;  Surgeon: Shelah Lamar RAMAN, MD;  Location: [facility];  Service: Pulmonary;  Laterality: Left;   VIDEO BRONCHOSCOPY WITH ENDOBRONCHIAL ULTRASOUND Bilateral 10/14/2024   Procedure: BRONCHOSCOPY, WITH EBUS;  Surgeon: Shelah Lamar RAMAN, MD;  Location: [facility] ENDOSCOPY;  Service: Pulmonary;  Laterality: Bilateral;   Social History:  reports that she quit smoking about 16 years ago. Her smoking use included cigarettes. She has never used smokeless tobacco. She reports that she does not drink alcohol  and does not use drugs.  Allergies[1]  Family History  Problem Relation Age of Onset   Colon polyps Mother 47   Heart attack Father        33s   Stroke Neg Hx        also no PAD or aneurysm   Colon cancer Neg Hx    Esophageal cancer Neg Hx    Stomach cancer Neg Hx    Rectal cancer Neg Hx     Prior to Admission medications  Medication Sig Start  Date End Date Taking? Authorizing Provider  amLODipine  (NORVASC ) 10 MG tablet TAKE 1 TABLET BY MOUTH DAILY 11/08/24  Yes Copland, Jacques, MD  apixaban  (ELIQUIS ) 5 MG TABS tablet Take 1 tablet (5 mg total) by mouth 2 (two) times daily. 08/24/24  Yes Henry Shaver B, NP  azithromycin  (ZITHROMAX  Z-PAK) 250 MG tablet Take 2 tablets by mouth on day 1, then 1 tablet daily until complete 11/22/24  Yes Pasam, Avinash, MD  clopidogrel  (PLAVIX ) 75 MG tablet Take 1 tablet (75 mg total) by mouth daily with breakfast. 07/14/24  Yes Cherlyn Labella, MD  dexamethasone  (DECADRON ) 4 MG tablet Take 8 mg by mouth See admin instructions. Take 8 mg by mouth once a day with food for 2 days- starting the day after chemotherapy   Yes [provider]  Faricimab -svoa (VABYSMO  IZ) 1 Dose by Intravitreal route every 6 (six) weeks.   Yes [provider]  lidocaine  (XYLOCAINE ) 2 % solution Patient: Mix 1 part 2% viscous lidocaine , 1  part water . Swish & swallow 10mL of diluted mixture 30 minutes before meals and at bedtime, up to 4 times daily 11/11/24  Yes Izell Domino, MD  lidocaine -prilocaine  (EMLA ) cream Apply to affected area once Patient taking differently: Apply 1 Application topically See admin instructions. Apply to affected area/port as directed 10/28/24  Yes Pasam, Avinash, MD  lisinopril  (ZESTRIL ) 20 MG tablet TAKE 1 TABLET BY MOUTH DAILY 11/08/24  Yes Copland, Jacques, MD  magic mouthwash (nystatin , lidocaine , diphenhydrAMINE , alum & mag hydroxide) suspension Take 5 mLs by mouth 4 (four) times daily as needed for mouth pain. Suspension contains equal amounts of Maalox Extra Strength, nystatin , diphenhydramine  and lidocaine . 1:1:1:1 ratio please. 11/18/24  Yes Pasam, Avinash, MD  metoprolol  (TOPROL -XL) 200 MG 24 hr tablet TAKE 1 TABLET BY MOUTH DAILY 11/08/24  Yes Copland, Jacques, MD  Nutritional Supplements (NUTREN 1.5) LIQD Nutren 1.5 - give one 250 ml carton 5x/day via PEG. Flush tube with 60 ml water  before and after each bolus. Give additional 120 ml free water  TID to meet hydration. Provides 1875 kcal, 85 g, 955 ml water  (1915 ml total water ). 1250 ml/day meets 100% DRI 11/22/24  Yes Pasam, Avinash, MD  ondansetron  (ZOFRAN ) 8 MG tablet Take 1 tablet (8 mg total) by mouth every 8 (eight) hours as needed for nausea or vomiting. Start on the third day after chemotherapy. 10/18/24  Yes Pasam, Avinash, MD  ondansetron  (ZOFRAN -ODT) 4 MG disintegrating tablet Take 1 tablet (4 mg total) by mouth every 8 (eight) hours as needed for nausea or vomiting. Patient taking differently: Take 4 mg by mouth every 8 (eight) hours as needed for nausea or vomiting (if not taking the 8 mg strength- dissolve orally). 06/18/24  Yes Jesus Oliphant, MD  oxyCODONE  (OXY IR/ROXICODONE ) 5 MG immediate release tablet Take 1 tablet (5 mg total) by mouth every 6 (six) hours as needed for severe pain (pain score 7-10). Patient taking differently: Take 5 mg by  mouth See admin instructions. Take 5 mg by mouth two times a day and an additional 5 mg twice a day as needed for severe pain 11/18/24  Yes Pasam, Avinash, MD  prochlorperazine  (COMPAZINE ) 10 MG tablet Take 1 tablet (10 mg total) by mouth every 6 (six) hours as needed for nausea or vomiting. 10/18/24  Yes Pasam, Avinash, MD  sodium fluoride (FLUORISHIELD) 1.1 % GEL dental gel Apply thin layer of fluoride gel inside the trays and wear them for 20 minutes nightly. Start after completion of HNRT 09/23/24  Yes [provider]  traMADol  (ULTRAM ) 50 MG tablet Take 1 tablet (50 mg total) by mouth every 8 (eight) hours as needed. Patient taking differently: Take 50 mg by mouth every 8 (eight) hours as needed (for pain). 08/24/24  Yes Copland, Jacques, MD  atorvastatin  (LIPITOR ) 80 MG tablet Take 1 tablet (80 mg total) by mouth daily. Patient not taking: Reported on 11/25/2024 07/14/24   Cherlyn Labella, MD    Physical Exam: Vitals:   11/25/24 1530 11/25/24 1615 11/25/24 1621 11/25/24 1623  BP: 133/81 114/72 112/67   Pulse: (!) 117 (!) 131  99  Resp: 20 (!) 23 16 17   Temp:      TempSrc:      SpO2: 99% 98%  97%  Weight:      Height:       Physical Activity: Insufficiently Active (04/29/2024)   Exercise Vital Sign    Days of Exercise per Week: 7 days    Minutes of Exercise per Session: 20 min   Physical Exam Vitals and nursing note reviewed.  Constitutional:      General: She is not in acute distress.    Appearance: She is not toxic-appearing.  HENT:     Head: Normocephalic and atraumatic.     Mouth/Throat:     Mouth: Mucous membranes are moist.     Pharynx: Oropharynx is clear.  Eyes:     Extraocular Movements: Extraocular movements intact.  Cardiovascular:     Rate and Rhythm: Tachycardia present. Rhythm irregular.  Pulmonary:     Effort: Pulmonary effort is normal.     Breath sounds: Normal breath sounds.  Abdominal:     General: Abdomen is flat. Bowel sounds are normal. There  is no distension.     Palpations: Abdomen is soft.     Tenderness: There is no abdominal tenderness.  Musculoskeletal:     Cervical back: Normal range of motion and neck supple.     Right lower leg: No edema.     Left lower leg: No edema.  Skin:    General: Skin is warm and dry.     Capillary Refill: Capillary refill takes less than 2 seconds.  Neurological:     Mental Status: She is alert. Mental status is at baseline.  Psychiatric:        Mood and Affect: Mood normal.        Behavior: Behavior normal.     Data Reviewed:   Labs on Admission: I have personally reviewed following labs and imaging studies  CBC: Recent Labs  Lab 11/22/24 0905 11/25/24 1324  WBC 1.0* 1.3*  NEUTROABS 0.7* 0.5*  HGB 7.8* 6.4*  HCT 23.0* 20.0*  MCV 92.4 96.2  PLT 131* 84*   Basic Metabolic Panel: Recent Labs  Lab 11/22/24 0905 11/25/24 1324  NA 132* 139  K 5.1 4.7  CL 99 106  CO2 20* 20*  GLUCOSE 126* 110*  BUN 44* 47*  CREATININE 1.51* 1.78*  CALCIUM  8.3* 8.9   GFR: Estimated Creatinine Clearance: 20.8 mL/min (A) (by C-G formula based on SCr of 1.78 mg/dL (H)). Liver Function Tests: Recent Labs  Lab 11/22/24 0905  AST 18  ALT 13  ALKPHOS 58  BILITOT 0.7  PROT 6.9  ALBUMIN 3.8   No results for input(s): LIPASE, AMYLASE in the last 168 hours. No results for input(s): AMMONIA in the last 168 hours. Coagulation Profile: No results for input(s): INR, PROTIME in the last 168 hours. Cardiac Enzymes: No results for input(s):  CKTOTAL, CKMB, CKMBINDEX, TROPONINI in the last 168 hours. BNP (last 3 results) No results for input(s): PROBNP in the last 8760 hours. HbA1C: No results for input(s): HGBA1C in the last 72 hours. CBG: No results for input(s): GLUCAP in the last 168 hours. Lipid Profile: No results for input(s): CHOL, HDL, LDLCALC, TRIG, CHOLHDL, LDLDIRECT in the last 72 hours. Thyroid  Function Tests: No results for input(s):  TSH, T4TOTAL, FREET4, T3FREE, THYROIDAB in the last 72 hours. Anemia Panel: No results for input(s): VITAMINB12, FOLATE, FERRITIN, TIBC, IRON , RETICCTPCT in the last 72 hours. Urine analysis:    Component Value Date/Time   COLORURINE YELLOW 12/19/2011 1144   APPEARANCEUR CLEAR 12/19/2011 1144   LABSPEC 1.020 12/19/2011 1144   PHURINE 5.0 12/19/2011 1144   GLUCOSEU NEGATIVE 12/19/2011 1144   HGBUR NEGATIVE 12/19/2011 1144   BILIRUBINUR NEGATIVE 12/19/2011 1144   KETONESUR NEGATIVE 12/19/2011 1144   PROTEINUR NEGATIVE 12/19/2011 1144   UROBILINOGEN 0.2 12/19/2011 1144   NITRITE NEGATIVE 12/19/2011 1144   LEUKOCYTESUR SMALL (A) 12/19/2011 1144    Radiological Exams on Admission: DG Chest Portable 1 View Result Date: 11/25/2024 EXAM: 1 VIEW(S) XRAY OF THE CHEST 11/25/2024 01:27:00 PM COMPARISON: 10/14/2024 CLINICAL HISTORY: Cough. FINDINGS: LINES, TUBES AND DEVICES: Power injectable Right chest wall Port-A-Cath in place with tip overlying the superior caval junction. LUNGS AND PLEURA: Left upper lobe nodule with adjacent fiducial marker. Reduced density around the left upper lobe fiducial compared to previous. Linear subsegmental atelectasis or scarring at the left lung base. No pleural effusion. No pneumothorax. HEART AND MEDIASTINUM: Mild cardiomegaly. Leftward rotation observed. Aortic calcification. BONES AND SOFT TISSUES: Chronic deformity of the right posterolateral 9th rib. IMPRESSION: 1. Decreased density surrounding the left upper lobe  fiducial marker. 2. Linear subsegmental atelectasis or scarring at the left lung base. 3. Mild cardiomegaly. 4. Right chest wall port-a-cath with tip at the superior cavoatrial junction. 5. Aortic atherosclerotic calcification. 6. Chronic deformity of the right posterolateral ninth rib. Electronically signed by: Ryan Salvage MD 11/25/2024 02:14 PM EST RP Workstation: [id number]       Assessment and Plan:  #Paroxysmal afib,  in RVR  - asymptomatic and hemodynamically stable. In the setting of missed medications - cont diltiazem  gtt started in the ED  - resume home mediations, will need education on how to administer through gtube    #squamous of carcinoma of the tongue status post left partial glossectomy on chemo therapy and radiation  #chemotherapy induced thrombocytopenia, anemia, neutropenia - hgb 6.4, 1 unit PRBC to be transfused in the ED. Monitor cell counts  - neutropenic precautions  - continue prophylactic azithromycin   - chemotherapy currently on hold 2/2 cytopenias, receiving Zarxio   - f/u oncology   #HTN  - hold lisinopril  while on diltiazem  gtt, resume as indicated   #Gtube dependent - tube feeds resumed   #CKD3 - stable, monitor. Avoid nephrotoxic agents    LR@50  Eliquis   Monitor/replace electrolytes  Tube feeds   Advance Care Planning:   Code Status: Prior discussed with the patient at time of admission    Severity of Illness: The appropriate patient status for this patient is OBSERVATION. Observation status is judged to be reasonable and necessary in order to provide the required intensity of service to ensure the patient's safety. The patient's presenting symptoms, physical exam findings, and initial radiographic and laboratory data in the context of their medical condition is felt to place them at decreased risk for further clinical deterioration. Furthermore, it is anticipated that the patient  will be medically stable for discharge from the hospital within 2 midnights of admission.   Author: Daved JAYSON Pump, DO 11/25/2024 4:28 PM  For on call review [email or web address].     [1]  Allergies Allergen Reactions   Statins Rash and Other (See Comments)    hands broke out   "

## 2024-11-26 ENCOUNTER — Other Ambulatory Visit: Payer: Self-pay

## 2024-11-26 ENCOUNTER — Inpatient Hospital Stay (HOSPITAL_COMMUNITY)

## 2024-11-26 ENCOUNTER — Ambulatory Visit

## 2024-11-26 ENCOUNTER — Inpatient Hospital Stay

## 2024-11-26 DIAGNOSIS — I129 Hypertensive chronic kidney disease with stage 1 through stage 4 chronic kidney disease, or unspecified chronic kidney disease: Secondary | ICD-10-CM | POA: Diagnosis present

## 2024-11-26 DIAGNOSIS — D709 Neutropenia, unspecified: Secondary | ICD-10-CM | POA: Diagnosis present

## 2024-11-26 DIAGNOSIS — N1832 Chronic kidney disease, stage 3b: Secondary | ICD-10-CM | POA: Diagnosis present

## 2024-11-26 DIAGNOSIS — Y731 Therapeutic (nonsurgical) and rehabilitative gastroenterology and urology devices associated with adverse incidents: Secondary | ICD-10-CM | POA: Diagnosis present

## 2024-11-26 DIAGNOSIS — I70262 Atherosclerosis of native arteries of extremities with gangrene, left leg: Secondary | ICD-10-CM | POA: Diagnosis present

## 2024-11-26 DIAGNOSIS — E43 Unspecified severe protein-calorie malnutrition: Secondary | ICD-10-CM | POA: Insufficient documentation

## 2024-11-26 DIAGNOSIS — Z1152 Encounter for screening for COVID-19: Secondary | ICD-10-CM | POA: Diagnosis not present

## 2024-11-26 DIAGNOSIS — I4891 Unspecified atrial fibrillation: Secondary | ICD-10-CM | POA: Diagnosis present

## 2024-11-26 DIAGNOSIS — Y848 Other medical procedures as the cause of abnormal reaction of the patient, or of later complication, without mention of misadventure at the time of the procedure: Secondary | ICD-10-CM | POA: Diagnosis present

## 2024-11-26 DIAGNOSIS — N179 Acute kidney failure, unspecified: Secondary | ICD-10-CM | POA: Diagnosis present

## 2024-11-26 DIAGNOSIS — I480 Paroxysmal atrial fibrillation: Secondary | ICD-10-CM | POA: Diagnosis present

## 2024-11-26 DIAGNOSIS — D61810 Antineoplastic chemotherapy induced pancytopenia: Secondary | ICD-10-CM | POA: Diagnosis present

## 2024-11-26 DIAGNOSIS — D61818 Other pancytopenia: Secondary | ICD-10-CM | POA: Diagnosis present

## 2024-11-26 DIAGNOSIS — Z7901 Long term (current) use of anticoagulants: Secondary | ICD-10-CM | POA: Diagnosis not present

## 2024-11-26 DIAGNOSIS — J9811 Atelectasis: Secondary | ICD-10-CM | POA: Diagnosis present

## 2024-11-26 DIAGNOSIS — J439 Emphysema, unspecified: Secondary | ICD-10-CM | POA: Diagnosis present

## 2024-11-26 DIAGNOSIS — C023 Malignant neoplasm of anterior two-thirds of tongue, part unspecified: Secondary | ICD-10-CM | POA: Diagnosis present

## 2024-11-26 DIAGNOSIS — J189 Pneumonia, unspecified organism: Secondary | ICD-10-CM | POA: Diagnosis present

## 2024-11-26 DIAGNOSIS — H4010X0 Unspecified open-angle glaucoma, stage unspecified: Secondary | ICD-10-CM | POA: Diagnosis present

## 2024-11-26 DIAGNOSIS — Z95828 Presence of other vascular implants and grafts: Secondary | ICD-10-CM | POA: Diagnosis not present

## 2024-11-26 DIAGNOSIS — G893 Neoplasm related pain (acute) (chronic): Secondary | ICD-10-CM | POA: Diagnosis present

## 2024-11-26 DIAGNOSIS — D631 Anemia in chronic kidney disease: Secondary | ICD-10-CM | POA: Diagnosis present

## 2024-11-26 DIAGNOSIS — Z7902 Long term (current) use of antithrombotics/antiplatelets: Secondary | ICD-10-CM | POA: Diagnosis not present

## 2024-11-26 DIAGNOSIS — K9423 Gastrostomy malfunction: Secondary | ICD-10-CM | POA: Diagnosis present

## 2024-11-26 DIAGNOSIS — E785 Hyperlipidemia, unspecified: Secondary | ICD-10-CM | POA: Diagnosis present

## 2024-11-26 DIAGNOSIS — D6959 Other secondary thrombocytopenia: Secondary | ICD-10-CM | POA: Diagnosis present

## 2024-11-26 LAB — BASIC METABOLIC PANEL WITH GFR
Anion gap: 12 (ref 5–15)
BUN: 36 mg/dL — ABNORMAL HIGH (ref 8–23)
CO2: 21 mmol/L — ABNORMAL LOW (ref 22–32)
Calcium: 8.9 mg/dL (ref 8.9–10.3)
Chloride: 109 mmol/L (ref 98–111)
Creatinine, Ser: 1.31 mg/dL — ABNORMAL HIGH (ref 0.44–1.00)
GFR, Estimated: 42 mL/min — ABNORMAL LOW
Glucose, Bld: 112 mg/dL — ABNORMAL HIGH (ref 70–99)
Potassium: 4.5 mmol/L (ref 3.5–5.1)
Sodium: 141 mmol/L (ref 135–145)

## 2024-11-26 LAB — BPAM RBC
Blood Product Expiration Date: 202602152359
ISSUE DATE / TIME: 202601191630
Unit Type and Rh: 5100

## 2024-11-26 LAB — TYPE AND SCREEN
ABO/RH(D): O POS
Antibody Screen: NEGATIVE
Unit division: 0

## 2024-11-26 LAB — CBC
HCT: 22.9 % — ABNORMAL LOW (ref 36.0–46.0)
Hemoglobin: 7.6 g/dL — ABNORMAL LOW (ref 12.0–15.0)
MCH: 31.5 pg (ref 26.0–34.0)
MCHC: 33.2 g/dL (ref 30.0–36.0)
MCV: 95 fL (ref 80.0–100.0)
Platelets: 82 K/uL — ABNORMAL LOW (ref 150–400)
RBC: 2.41 MIL/uL — ABNORMAL LOW (ref 3.87–5.11)
RDW: 17 % — ABNORMAL HIGH (ref 11.5–15.5)
WBC: 2.1 K/uL — ABNORMAL LOW (ref 4.0–10.5)
nRBC: 1.9 % — ABNORMAL HIGH (ref 0.0–0.2)

## 2024-11-26 LAB — TSH: TSH: 0.763 u[IU]/mL (ref 0.350–4.500)

## 2024-11-26 LAB — PROCALCITONIN: Procalcitonin: 0.8 ng/mL

## 2024-11-26 LAB — MAGNESIUM: Magnesium: 1.5 mg/dL — ABNORMAL LOW (ref 1.7–2.4)

## 2024-11-26 MED ORDER — ACETAMINOPHEN 325 MG PO TABS
650.0000 mg | ORAL_TABLET | Freq: Four times a day (QID) | ORAL | Status: DC | PRN
  Administered 2024-11-27: 650 mg
  Filled 2024-11-26: qty 2

## 2024-11-26 MED ORDER — THIAMINE MONONITRATE 100 MG PO TABS
100.0000 mg | ORAL_TABLET | Freq: Every day | ORAL | Status: DC
  Administered 2024-11-26 – 2024-11-29 (×4): 100 mg
  Filled 2024-11-26 (×4): qty 1

## 2024-11-26 MED ORDER — PROCHLORPERAZINE MALEATE 10 MG PO TABS: 10.0000 mg | ORAL_TABLET | Freq: Four times a day (QID) | ORAL | Status: DC | PRN

## 2024-11-26 MED ORDER — FREE WATER
120.0000 mL | Status: DC
  Administered 2024-11-26: 120 mL

## 2024-11-26 MED ORDER — LOPERAMIDE HCL 1 MG/7.5ML PO SUSP
1.0000 mg | ORAL | Status: DC | PRN
  Administered 2024-11-26: 1 mg
  Filled 2024-11-26 (×2): qty 7.5

## 2024-11-26 MED ORDER — APIXABAN 5 MG PO TABS
5.0000 mg | ORAL_TABLET | Freq: Two times a day (BID) | ORAL | Status: DC
  Administered 2024-11-26 – 2024-11-29 (×6): 5 mg
  Filled 2024-11-26 (×7): qty 1

## 2024-11-26 MED ORDER — FILGRASTIM-AAFI 300 MCG/0.5ML IJ SOSY
300.0000 ug | PREFILLED_SYRINGE | Freq: Every day | INTRAMUSCULAR | Status: DC
  Administered 2024-11-26 – 2024-11-27 (×2): 300 ug via SUBCUTANEOUS
  Filled 2024-11-26 (×2): qty 0.5

## 2024-11-26 MED ORDER — MORPHINE SULFATE (PF) 2 MG/ML IV SOLN
2.0000 mg | INTRAVENOUS | Status: DC | PRN
  Administered 2024-11-26 (×2): 2 mg via INTRAVENOUS
  Filled 2024-11-26 (×2): qty 1

## 2024-11-26 MED ORDER — TRAMADOL HCL 50 MG PO TABS: 50.0000 mg | ORAL_TABLET | Freq: Two times a day (BID) | ORAL | Status: DC | PRN

## 2024-11-26 MED ORDER — SODIUM CHLORIDE 0.9 % IV SOLN: INTRAVENOUS | Status: DC

## 2024-11-26 MED ORDER — MAGIC MOUTHWASH W/LIDOCAINE
5.0000 mL | Freq: Four times a day (QID) | ORAL | Status: DC
  Administered 2024-11-26 – 2024-11-29 (×13): 5 mL via ORAL
  Filled 2024-11-26 (×14): qty 5

## 2024-11-26 MED ORDER — DILTIAZEM HCL 60 MG PO TABS: 60.0000 mg | ORAL_TABLET | Freq: Four times a day (QID) | ORAL | Status: DC

## 2024-11-26 MED ORDER — MORPHINE SULFATE 10 MG/5ML PO SOLN
10.0000 mg | ORAL | Status: DC | PRN
  Administered 2024-11-26 – 2024-11-27 (×2): 10 mg via ORAL
  Filled 2024-11-26 (×2): qty 5

## 2024-11-26 MED ORDER — DILTIAZEM HCL 60 MG PO TABS
60.0000 mg | ORAL_TABLET | Freq: Four times a day (QID) | ORAL | Status: DC
  Administered 2024-11-26 – 2024-11-28 (×6): 60 mg
  Filled 2024-11-26 (×7): qty 1

## 2024-11-26 MED ORDER — KATE FARMS STANDARD 1.4 EN LIQD
1000.0000 mL | ENTERAL | Status: DC
  Administered 2024-11-26: 1000 mL
  Filled 2024-11-26 (×2): qty 1000

## 2024-11-26 MED ORDER — MORPHINE SULFATE 10 MG/5ML PO SOLN: 10.0000 mg | ORAL | Status: DC | PRN

## 2024-11-26 MED ORDER — LACTATED RINGERS IV SOLN: INTRAVENOUS | Status: DC

## 2024-11-26 MED ORDER — ACETAMINOPHEN 650 MG RE SUPP: 650.0000 mg | Freq: Four times a day (QID) | RECTAL | Status: DC | PRN

## 2024-11-26 MED ORDER — SODIUM CHLORIDE 0.9 % IV SOLN
2.0000 g | INTRAVENOUS | Status: DC
  Administered 2024-11-26 – 2024-11-28 (×3): 2 g via INTRAVENOUS
  Filled 2024-11-26 (×3): qty 12.5

## 2024-11-26 MED ORDER — MORPHINE SULFATE (PF) 2 MG/ML IV SOLN
2.0000 mg | INTRAVENOUS | Status: DC | PRN
  Administered 2024-11-26: 2 mg via INTRAVENOUS

## 2024-11-26 NOTE — Progress Notes (Signed)
 Initial Nutrition Assessment  DOCUMENTATION CODES:   Severe malnutrition in context of chronic illness  INTERVENTION:   Monitor magnesium , potassium, and phosphorus daily for at least 3 days, MD to replete as needed, as pt is at risk for refeeding syndrome. -Thiamine  100 mg daily for at least 7 days   -Initiate [employer] 1.4 @ 20 ml/hr via PEG -Advance by 10 ml every 12 hours to goal rate of 55 ml/hr. -Provides 1848 kcals, 81g protein and 937 ml H2O -Recommend free water : 120 ml every 4 hours (720 ml)  At discharge, if tolerates Mallie Farms: -Recommend Compleat 1.4, 5 cartons daily. -Provides 1750 kcals, 90g protein and 970 ml H2O -Flush with 60 ml free water  before and after each feeding -Needs additional free water : 100 ml BID  NUTRITION DIAGNOSIS:   Severe Malnutrition related to chronic illness, cancer and cancer related treatments as evidenced by percent weight loss, energy intake < or equal to 75% for > or equal to 1 month, moderate fat depletion, severe muscle depletion.  GOAL:   Patient will meet greater than or equal to 90% of their needs  MONITOR:   TF tolerance  REASON FOR ASSESSMENT:   Consult Enteral/tube feeding initiation and management  ASSESSMENT:   79 y.o. female with medical history significant of paroxysmal afib on eliquis , CKD3, HTN, PVD, squamous of carcinoma of the tongue status post left partial glossectomy on chemo therapy and radiation with chemotherapy induced thrombocytopenia, anemia, neutropenia, G-tube dependant, sent to the ED from cancer center after she was noted to be in afib RVR.   She admits she has not taken her medications (including eliquis  and metoprolol ) in 3 days because she was never instructed on how to administer them through her G-tube.   ROS positive for non-bloody diarrhea (which has been present since she started tube feeds), and sore throat which is chronic 2/2 mucositis.  Patient in room, no family at bedside. Pt had PEG  placed 12/26. Reports she has not met her nutritional needs in 2 weeks. Has never gotten to goal for tube feeds as she was still eating at time of PEG placement. Pt [facility], on 12/29 they report pt was still eating 3 meals a day at that time.  Pt unable to eat currently d/t mucositis. States her mouth pain has improved.  At home pt was recommended to provide 5 cartons of Nutren 1.5 daily. Reports she was still administering 2 cartons of Osmolite 1.5 a day. Having diarrhea and was not able to tolerate more. Never tried Mallie Pinion at home per report today.  Will trial Mallie Pinion here continuously to ensure tolerance and see if there is improvement in bowel movements.  Pt is followed by [facility] for tube feeds needs. Given poor PO and limited tube feeds at home, pt will be at risk of refeeding syndrome. Will monitor electrolytes and add Thiamine  via tube.  Per weight records, pt has lost 21 lbs since 08/21/24 (14% wt loss x 3 months, significant for time frame). Current weight: 127 lbs  Medications: Magic mouthwash w/ lidocaine , Lactated ringers , imodium   Labs reviewed: CBGs: 99 Low Mg   NUTRITION - FOCUSED PHYSICAL EXAM:  Flowsheet Row Most Recent Value  Orbital Region Moderate depletion  [left bruising]  Upper Arm Region Moderate depletion  Thoracic and Lumbar Region Unable to assess  Buccal Region Severe depletion  Temple Region Moderate depletion  Clavicle Bone Region Moderate depletion  Clavicle and Acromion Bone Region Moderate depletion  Scapular Bone Region Moderate depletion  Dorsal Hand Moderate depletion  Patellar Region Severe depletion  Anterior Thigh Region Severe depletion  Posterior Calf Region Severe depletion  Edema (RD Assessment) None  Hair Unable to assess  [covered, hair loss from treatment]  Eyes Reviewed  Mouth Reviewed  [dry, blood]  Skin Reviewed  Nails Reviewed    Diet Order:   Diet Order             Diet NPO time specified   Diet effective now                   EDUCATION NEEDS:   Education needs have been addressed  Skin:  Skin Assessment: Skin Integrity Issues: Skin Integrity Issues:: Other (Comment) Other: left foot wound from dog bite in Sept 2025  Last BM:  1/20 -type 7  Height:   Ht Readings from Last 1 Encounters:  11/25/24 5' (1.524 m)    Weight:   Wt Readings from Last 1 Encounters:  11/25/24 57.9 kg    BMI:  Body mass index is 24.94 kg/m.  Estimated Nutritional Needs:   Kcal:  1750-1950  Protein:  85-95g  Fluid:  1.9L/day   Morna Lee, MS, RD, LDN Inpatient Clinical Dietitian Contact via Secure chat

## 2024-11-26 NOTE — Progress Notes (Signed)
 Patient having copious amounts of yellow bile like liquid leaking from around peg site. 300 cc's of yellow bile colored liquid pulled out of peg to stop peg from continuously leaking. Patient irritated around peg tube and getting red, so barrier cream put on skin around tube. Dr. Earley notified and came by to assess site.

## 2024-11-26 NOTE — Progress Notes (Addendum)
 " [facility] Progress Note  Patient: Taylor Bush     FMW:[id number]  DOA: 11/25/2024   PCP: Watt Mirza, MD       Brief hospital course: This is a 79 year old with paroxysmal A-fib, chronic kidney disease, squamous cell carcinoma of the tongue, hypertension, PVD, chemotherapy induced pancytopenia, recently placed G-tube who presents to the hospital from the cancer center with a heart rate in the 140s.  She stated that she had not taken her medications in 3 days because she was n.p.o. and had not been shown how to take medications with the PEG tube. In the ED: A-fib with heart rates as high as 150 WBC count 1.3 Hemoglobin 6.4 Platelets 84 Bicarb 20 BUN 47 Creatinine 1.78-baseline between 1.2 and 1.5 Given 10 mg of IV metoprolol  without improvement and then started on a diltiazem  infusion  Subjective:  Pain in tongue is severe and not controlled with oral morphine . Tongue was bleeding a little. She has been having diarrhea with at least 4-5 BMs daily after tube feeds were started and is still persisting. No abdominal pain or nausea. No pain in PEG site. She has a cough with congestion.   Assessment and Plan: Principal Problem:   Paroxysmal atrial fibrillation with RVR - rated controlled with cardizem  infusion at 10 cc/hr now - switch to 60 mg QID of Cardizem  - cont Lopressor  100 mg BID - Eliquis  resumed- has been on both Eliquis  and Plavix  per cardiology and Vascular surgery- -CHA2DS2-VASc Score at least 5   Active Problems:   Malignant neoplasm of anterior two-thirds of tongue  Cancer related pain Mucositis - s/p L partial glossectomy on 06/17/24 and neck dissection on 08/21/24- stage IVb disease - she has been receiving chemoradiation  - radiation is going to be held today on patient's request - add IV Morphine  and increase oral Morphine  from 5 to 10 mg- cont Lidocaine  mouthwash - baking soda/salt rinses also recommended - 12/26 PEG placed  Cough- hypoxia -  with congestions - CXR showing atelectasis vs scarring a LL base - emphysema noted on prior CT in 12/5 - pulse ox in 80s when on room air- obtain CT chest w/o contrast (takes Eliquis  and Plavix - I have a higher suspicion for infection at this time) - may need to try Robinul  or Scopolamine  - follow - Onc note from 1/16 suggested her cough may be viral- she was given a Z pak and recommended to use OTC cough medications - addendum- LLL opacity on CT- start Cefepime  and Vanc- check resp panel, flu, covid, RSV, sputum culture, strep and legionella antigen   PEG tube leaking - leaking while on trickle feeds - large amount of leakage required tube feeds to be drained out by RN- 200 cc draining - PEG checked- no abnormality noted- patient states that it occasionally leaked at home - hold feeds, resume IVF and requested IR consult for tomorrow  Diarrhea - per onc note Loperamide  ordered- continue this - check for c diff   Nutrition - per dietician> start [employer] 1.4 @ 20 cc/hr, advance by 10 cc Q 12 until 55 cc/hr - free water  120 cc every 4 hrs-  -cont Thiamine  100 mg x 7 days - dc plan> Compleat 1.4- 5 cartons/day, free water  100 cc BID and flush 60 cc before and after feed  AKI- CKD stage 3 a-b - Cr 1.78- now 1.31  HTN - Amlodipine  and Lisinopril  on hold  LUL nodule - biopsy showed reactive cells on 10/14/24  Pancytopenia   - Hgb 6.4- improved to 7.6 after 1 U PRBC - filgastrium x 3 days starting today  PAD with non healing LLE wound with gangrene  - 9/25> H/o L SFA and popliteal artery arterectomy, shock wave lithotripsy and stenting - plan was to continue ASA, Plavix  and statin - wound appears to be healing well on my exam- follows with Dr Harden - - Plavix  on hold for now since tongue was oozing    Code Status: Full Code Total time on patient care: 55 min DVT prophylaxis:   apixaban  (ELIQUIS ) tablet 5 mg     Objective:   Vitals:   11/26/24 0500 11/26/24 0600 11/26/24  0700 11/26/24 0705  BP: (!) 128/46 (!) 123/47 (!) 122/39   Pulse: 88 81 86   Resp: 17 16 16    Temp:    98 F (36.7 C)  TempSrc:    Oral  SpO2: 100% 100% 91%   Weight:      Height:       Filed Weights   11/25/24 1223  Weight: 57.9 kg   Exam: General exam: Appears uncomfortable -  HEENT: oral mucosa moist- dried blood on tongue and around lips Respiratory system: Clear to auscultation. Coughing, congestion is mainly in throat Cardiovascular system: S1 & S2 heard - IIRR Gastrointestinal system: Abdomen soft, non-tender, nondistended. Normal bowel sounds  - PEG noted- skin around peg is erythematous  Extremities: No cyanosis, clubbing or edema Psychiatry:  Mood & affect appropriate.      CBC: Recent Labs  Lab 11/22/24 0905 11/25/24 1324 11/26/24 0515  WBC 1.0* 1.3* 2.1*  NEUTROABS 0.7* 0.5*  --   HGB 7.8* 6.4* 7.6*  HCT 23.0* 20.0* 22.9*  MCV 92.4 96.2 95.0  PLT 131* 84* 82*   Basic Metabolic Panel: Recent Labs  Lab 11/22/24 0905 11/25/24 1324 11/26/24 0515  NA 132* 139 141  K 5.1 4.7 4.5  CL 99 106 109  CO2 20* 20* 21*  GLUCOSE 126* 110* 112*  BUN 44* 47* 36*  CREATININE 1.51* 1.78* 1.31*  CALCIUM  8.3* 8.9 8.9  MG  --   --  1.5*     Scheduled Meds:  sodium chloride    Intravenous Once   apixaban   5 mg Per Tube BID   azithromycin   250 mg Per Tube Daily   Chlorhexidine  Gluconate Cloth  6 each Topical Daily   feeding supplement  237 mL Per Tube 5 X Daily   metoprolol  tartrate  100 mg Per Tube BID   sodium chloride  flush  10-40 mL Intracatheter Q12H    Imaging and lab data personally reviewed   Author: [employer]  11/26/2024 8:27 AM  To contact [facility]>   Check the care team in [facility] and look for the attending/consulting [facility] provider listed  Log into [email or web address] and use [facility] universal password   Go to> [facility]  and find provider  If you still have difficulty reaching the provider, please page the [facility] (Director on  Call) for the Hospitalists listed on amion     "

## 2024-11-26 NOTE — Plan of Care (Signed)
  Problem: Clinical Measurements: Goal: Cardiovascular complication will be avoided Outcome: Progressing   Problem: Clinical Measurements: Goal: Will remain free from infection Outcome: Progressing

## 2024-11-26 NOTE — Progress Notes (Signed)
 PT Cancellation Note  Patient Details Name: Taylor Bush MRN: [id number] DOB: [birth year]   Cancelled Treatment:     PT order received but eval deferred this date at request of RN 2* pt level of hypoxia and increased WOB at rest.  Will follow.   Lometa Riggin 11/26/2024, 2:47 PM

## 2024-11-27 ENCOUNTER — Inpatient Hospital Stay

## 2024-11-27 ENCOUNTER — Ambulatory Visit

## 2024-11-27 DIAGNOSIS — I480 Paroxysmal atrial fibrillation: Secondary | ICD-10-CM | POA: Diagnosis not present

## 2024-11-27 LAB — CBC
HCT: 24.9 % — ABNORMAL LOW (ref 36.0–46.0)
Hemoglobin: 7.6 g/dL — ABNORMAL LOW (ref 12.0–15.0)
MCH: 30.6 pg (ref 26.0–34.0)
MCHC: 30.5 g/dL (ref 30.0–36.0)
MCV: 100.4 fL — ABNORMAL HIGH (ref 80.0–100.0)
Platelets: 97 K/uL — ABNORMAL LOW (ref 150–400)
RBC: 2.48 MIL/uL — ABNORMAL LOW (ref 3.87–5.11)
RDW: 17 % — ABNORMAL HIGH (ref 11.5–15.5)
WBC: 5 K/uL (ref 4.0–10.5)
nRBC: 1 % — ABNORMAL HIGH (ref 0.0–0.2)

## 2024-11-27 LAB — GLUCOSE, CAPILLARY
Glucose-Capillary: 91 mg/dL (ref 70–99)
Glucose-Capillary: 87 mg/dL (ref 70–99)
Glucose-Capillary: 87 mg/dL (ref 70–99)
Glucose-Capillary: 81 mg/dL (ref 70–99)
Glucose-Capillary: 106 mg/dL — ABNORMAL HIGH (ref 70–99)

## 2024-11-27 LAB — BASIC METABOLIC PANEL WITH GFR
Anion gap: 11 (ref 5–15)
BUN: 38 mg/dL — ABNORMAL HIGH (ref 8–23)
CO2: 22 mmol/L (ref 22–32)
Calcium: 9.4 mg/dL (ref 8.9–10.3)
Chloride: 111 mmol/L (ref 98–111)
Creatinine, Ser: 1.31 mg/dL — ABNORMAL HIGH (ref 0.44–1.00)
GFR, Estimated: 42 mL/min — ABNORMAL LOW
Glucose, Bld: 104 mg/dL — ABNORMAL HIGH (ref 70–99)
Potassium: 4.4 mmol/L (ref 3.5–5.1)
Sodium: 144 mmol/L (ref 135–145)

## 2024-11-27 LAB — HIV ANTIBODY (ROUTINE TESTING W REFLEX): HIV Screen 4th Generation wRfx: NONREACTIVE

## 2024-11-27 LAB — RESP PANEL BY RT-PCR (RSV, FLU A&B, COVID)  RVPGX2
Influenza A by PCR: NEGATIVE
Influenza B by PCR: NEGATIVE
Resp Syncytial Virus by PCR: NEGATIVE
SARS Coronavirus 2 by RT PCR: NEGATIVE

## 2024-11-27 LAB — PHOSPHORUS: Phosphorus: 3.6 mg/dL (ref 2.5–4.6)

## 2024-11-27 LAB — MAGNESIUM: Magnesium: 1.5 mg/dL — ABNORMAL LOW (ref 1.7–2.4)

## 2024-11-27 MED ORDER — MORPHINE SULFATE (PF) 2 MG/ML IV SOLN
2.0000 mg | INTRAVENOUS | Status: DC | PRN
  Administered 2024-11-27 – 2024-11-28 (×3): 2 mg via INTRAVENOUS
  Filled 2024-11-27: qty 2
  Filled 2024-11-27 (×2): qty 1

## 2024-11-27 MED ORDER — KATE FARMS STANDARD 1.4 EN LIQD
1000.0000 mL | ENTERAL | Status: DC
  Administered 2024-11-27: 1000 mL
  Filled 2024-11-27: qty 1000

## 2024-11-27 MED ORDER — MAGNESIUM SULFATE 2 GM/50ML IV SOLN
2.0000 g | Freq: Once | INTRAVENOUS | Status: AC
  Administered 2024-11-27: 2 g via INTRAVENOUS
  Filled 2024-11-27: qty 50

## 2024-11-27 MED ORDER — MORPHINE SULFATE 10 MG/5ML PO SOLN
10.0000 mg | ORAL | Status: DC | PRN
  Administered 2024-11-27: 10 mg
  Filled 2024-11-27: qty 5

## 2024-11-27 NOTE — Progress Notes (Signed)
 " PROGRESS NOTE    Taylor Bush  FMW:[id number] DOB: [birth year] DOA: 11/25/2024 PCP: Watt Mirza, MD     Brief Narrative:  Taylor Bush is a 79 y.o. female with medical history significant of paroxysmal afib on eliquis , CKD3, HTN, PVD, squamous of carcinoma of the tongue status post left partial glossectomy on chemo therapy and radiation with chemotherapy induced thrombocytopenia, anemia, neutropenia, G-tube dependant, sent to the ED from cancer center after she was noted to be in A Fib RVR.   She admits she has not taken her medications (including eliquis  and metoprolol ) in 3 days because she was never instructed on how to administer them through her G-tube.   In the ED, she was in afib RVR rate up to 150. BP stable. HGB 6.4, WBC 1.3. 1 unit PRBC ordered to be transfused. She was given  10mg  IV metoprolol  without significant improvement in her rate, started on diltiazem  drip and hospitalist consulted for admission.   New events last 24 hours / Subjective: Patient feeling well overall. She is on [place] O2, denies worsening SOB. HR improved in 70s. No diarrhea today.   Assessment & Plan:   Principal Problem:   Paroxysmal atrial fibrillation (HCC) Active Problems:   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)   Pancytopenia (HCC)   A-fib (HCC)   Protein-calorie malnutrition, severe   Paroxysmal A Fib RVR - Cardizem  drip --> Cardizem  and Lopressor  by tube  - Eliquis  - HR controlled this morning   Malignant neoplasm of tongue with mucositis - Status post left partial glossectomy 06/17/2024 and neck dissection 08/21/2024 - Followed by Dr. Autumn  - Currently on chemoradiation - PEG for nutrition and medication administration - Dietitian consult for TF   Left-sided pneumonia - Influenza/RSV/Covid negative  - MRSA PCR negative, discontinue Vanco - Continue cefepime    Pancytopenia - In setting of chemoradiation - S/p 1 unit prbc transfusion - Filgrastim  x 3     Hypomagnesemia - Replace  AKI on CKD stage 3b - Improved   HTN - Norvasc , lisinopril  currently on hold. BP stable   PAD  - Plavix  on hold due to tongue ozzing on admission   DVT prophylaxis:  apixaban  (ELIQUIS ) tablet 5 mg  Code Status: Full Family Communication: None at bedside Disposition Plan: Home with home health  Status is: Inpatient Remains inpatient appropriate because: IV antibiotics     Antimicrobials:  Anti-infectives (From admission, onward)    Start     Dose/Rate Route Frequency Ordered Stop   11/26/24 1830  ceFEPIme  (MAXIPIME ) 2 g in sodium chloride  0.9 % 100 mL IVPB        2 g 200 mL/hr over 30 Minutes Intravenous Every 24 hours 11/26/24 1753 12/01/24 1829   11/26/24 1000  azithromycin  (ZITHROMAX ) tablet 250 mg       Note to Pharmacy: Take 2 tablets by mouth on day 1, then 1 tablet daily until complete     250 mg Per Tube Daily 11/25/24 1757 11/27/24 1111   11/25/24 1700  azithromycin  (ZITHROMAX ) tablet 250 mg  Status:  Discontinued       Note to Pharmacy: Take 2 tablets by mouth on day 1, then 1 tablet daily until complete     250 mg Oral Daily 11/25/24 1700 11/25/24 1757        Objective: Vitals:   11/27/24 0800 11/27/24 0900 11/27/24 1000 11/27/24 1100  BP: (!) 126/97 (!) 100/44 107/62 (!) 111/52  Pulse: (!) 105 75 77 73  Resp: 14 17 18 16   Temp: 98.1 F (36.7 C)     TempSrc: Oral     SpO2: 96% 100% 100% 100%  Weight:      Height:        Intake/Output Summary (Last 24 hours) at 11/27/2024 1231 Last data filed at 11/27/2024 1112 Gross per 24 hour  Intake 2395.16 ml  Output --  Net 2395.16 ml   Filed Weights   11/25/24 1223  Weight: 57.9 kg    Examination:  General exam: Appears calm and comfortable  Respiratory system: Clear to auscultation. Respiratory effort normal. No respiratory distress. No conversational dyspnea. On [place] O2  Cardiovascular system: S1 & S2 heard. No murmurs. No pedal edema. Gastrointestinal system: Abdomen is  nondistended, soft and nontender Central nervous system: Alert and oriented. No focal neurological deficits. Speech clear.  Psychiatry: Judgement and insight appear normal. Mood & affect appropriate.   Data Reviewed: I have personally reviewed following labs and imaging studies  CBC: Recent Labs  Lab 11/22/24 0905 11/25/24 1324 11/26/24 0515 11/27/24 0517  WBC 1.0* 1.3* 2.1* 5.0  NEUTROABS 0.7* 0.5*  --   --   HGB 7.8* 6.4* 7.6* 7.6*  HCT 23.0* 20.0* 22.9* 24.9*  MCV 92.4 96.2 95.0 100.4*  PLT 131* 84* 82* 97*   Basic Metabolic Panel: Recent Labs  Lab 11/22/24 0905 11/25/24 1324 11/26/24 0515 11/27/24 0517  NA 132* 139 141 144  K 5.1 4.7 4.5 4.4  CL 99 106 109 111  CO2 20* 20* 21* 22  GLUCOSE 126* 110* 112* 104*  BUN 44* 47* 36* 38*  CREATININE 1.51* 1.78* 1.31* 1.31*  CALCIUM  8.3* 8.9 8.9 9.4  MG  --   --  1.5* 1.5*  PHOS  --   --   --  3.6   GFR: Estimated Creatinine Clearance: 28.2 mL/min (A) (by C-G formula based on SCr of 1.31 mg/dL (H)). Liver Function Tests: Recent Labs  Lab 11/22/24 0905  AST 18  ALT 13  ALKPHOS 58  BILITOT 0.7  PROT 6.9  ALBUMIN 3.8   No results for input(s): LIPASE, AMYLASE in the last 168 hours. No results for input(s): AMMONIA in the last 168 hours. Coagulation Profile: No results for input(s): INR, PROTIME in the last 168 hours. Cardiac Enzymes: No results for input(s): CKTOTAL, CKMB, CKMBINDEX, TROPONINI in the last 168 hours. BNP (last 3 results) No results for input(s): PROBNP in the last 8760 hours. HbA1C: No results for input(s): HGBA1C in the last 72 hours. CBG: Recent Labs  Lab 11/25/24 1802 11/27/24 0221 11/27/24 0732 11/27/24 1130  GLUCAP 99 91 87 87   Lipid Profile: No results for input(s): CHOL, HDL, LDLCALC, TRIG, CHOLHDL, LDLDIRECT in the last 72 hours. Thyroid  Function Tests: Recent Labs    11/26/24 0515  TSH 0.763   Anemia Panel: No results for input(s):  VITAMINB12, FOLATE, FERRITIN, TIBC, IRON , RETICCTPCT in the last 72 hours. Sepsis Labs: Recent Labs  Lab 11/26/24 1831  PROCALCITON 0.80    Recent Results (from the past 240 hours)  MRSA Next Gen by PCR, Nasal     Status: None   Collection Time: 11/25/24  6:01 PM   Specimen: Nasal Mucosa; Nasal Swab  Result Value Ref Range Status   MRSA by PCR Next Gen NOT DETECTED NOT DETECTED Final    Comment: (NOTE) The GeneXpert MRSA Assay (FDA approved for NASAL specimens only), is one component of a comprehensive MRSA colonization surveillance program. It is not intended to diagnose  MRSA infection nor to guide or monitor treatment for MRSA infections. Test performance is not FDA approved in patients less than 81 years old. Performed at [facility], [address], [place], [place] [address]   Resp panel by RT-PCR (RSV, Flu A&B, Covid) Anterior Nasal Swab     Status: None   Collection Time: 11/26/24  6:31 PM   Specimen: Anterior Nasal Swab  Result Value Ref Range Status   SARS Coronavirus 2 by RT PCR NEGATIVE NEGATIVE Final    Comment: (NOTE) SARS-CoV-2 target nucleic acids are NOT DETECTED.  The SARS-CoV-2 RNA is generally detectable in upper respiratory specimens during the acute phase of infection. The lowest concentration of SARS-CoV-2 viral copies this assay can detect is 138 copies/mL. A negative result does not preclude SARS-Cov-2 infection and should not be used as the sole basis for treatment or other patient management decisions. A negative result may occur with  improper specimen collection/handling, submission of specimen other than nasopharyngeal swab, presence of viral mutation(s) within the areas targeted by this assay, and inadequate number of viral copies(<138 copies/mL). A negative result must be combined with clinical observations, patient history, and epidemiological information. The expected result is Negative.  Fact Sheet for  Patients:  bloggercourse.com  Fact Sheet for Healthcare Providers:  seriousbroker.it  This test is no t yet approved or cleared by the [place]  FDA and  has been authorized for detection and/or diagnosis of SARS-CoV-2 by FDA under an Emergency Use Authorization (EUA). This EUA will remain  in effect (meaning this test can be used) for the duration of the COVID-19 declaration under Section 564(b)(1) of the Act, 21 U.S.C.section 360bbb-3(b)(1), unless the authorization is terminated  or revoked sooner.       Influenza A by PCR NEGATIVE NEGATIVE Final   Influenza B by PCR NEGATIVE NEGATIVE Final    Comment: (NOTE) The Xpert Xpress SARS-CoV-2/FLU/RSV plus assay is intended as an aid in the diagnosis of influenza from Nasopharyngeal swab specimens and should not be used as a sole basis for treatment. Nasal washings and aspirates are unacceptable for Xpert Xpress SARS-CoV-2/FLU/RSV testing.  Fact Sheet for Patients: bloggercourse.com  Fact Sheet for Healthcare Providers: seriousbroker.it  This test is not yet approved or cleared by the [place]  FDA and has been authorized for detection and/or diagnosis of SARS-CoV-2 by FDA under an Emergency Use Authorization (EUA). This EUA will remain in effect (meaning this test can be used) for the duration of the COVID-19 declaration under Section 564(b)(1) of the Act, 21 U.S.C. section 360bbb-3(b)(1), unless the authorization is terminated or revoked.     Resp Syncytial Virus by PCR NEGATIVE NEGATIVE Final    Comment: (NOTE) Fact Sheet for Patients: bloggercourse.com  Fact Sheet for Healthcare Providers: seriousbroker.it  This test is not yet approved or cleared by the [place]  FDA and has been authorized for detection and/or diagnosis of SARS-CoV-2 by FDA under an Emergency  Use Authorization (EUA). This EUA will remain in effect (meaning this test can be used) for the duration of the COVID-19 declaration under Section 564(b)(1) of the Act, 21 U.S.C. section 360bbb-3(b)(1), unless the authorization is terminated or revoked.  Performed at [facility], [address] [place], [place] [address]   Culture, blood (Routine X 2) w Reflex to ID Panel     Status: None (Preliminary result)   Collection Time: 11/26/24  7:12 PM   Specimen: BLOOD RIGHT HAND  Result Value Ref Range Status  Specimen Description   Final    BLOOD RIGHT HAND Performed at [facility], [address] [place], [place] [address]    Special Requests   Final    BOTTLES DRAWN AEROBIC ONLY Blood Culture results may not be optimal due to an inadequate volume of blood received in culture bottles Performed at [facility], [address], [place], [place] [address]    Culture   Final    NO GROWTH < 12 HOURS Performed at [facility], [address] [place], [place] [address]    Report Status PENDING  Incomplete  Culture, blood (Routine X 2) w Reflex to ID Panel     Status: None (Preliminary result)   Collection Time: 11/26/24  7:17 PM   Specimen: BLOOD RIGHT HAND  Result Value Ref Range Status   Specimen Description   Final    BLOOD RIGHT HAND Performed at [facility], [address], [place], [place] [address]    Special Requests   Final    BOTTLES DRAWN AEROBIC ONLY Blood Culture results may not be optimal due to an inadequate volume of blood received in culture bottles Performed at [facility], [address], [place], [place] [address]    Culture   Final    NO GROWTH < 12 HOURS Performed at [facility], [address] [place], [place] [address]    Report Status PENDING  Incomplete      Radiology Studies: CT CHEST WO CONTRAST Result Date: 11/26/2024 EXAM: CT CHEST WITHOUT CONTRAST 11/26/2024  05:11:00 PM TECHNIQUE: CT of the chest was performed without the administration of intravenous contrast. Multiplanar reformatted images are provided for review. Automated exposure control, iterative reconstruction, and/or weight based adjustment of the mA/kV was utilized to reduce the radiation dose to as low as reasonably achievable. COMPARISON: 10/11/2024 CLINICAL HISTORY: Respiratory illness, nondiagnostic xray FINDINGS: MEDIASTINUM: Diffuse coronary artery and aortic atherosclerosis. LYMPH NODES: Mildly prominent mediastinal lymph nodes are stable since the prior study and likely reactive. No axillary or hilar adenopathy. LUNGS AND PLEURA: Trace bilateral pleural effusions. Right basilar atelectasis. Somewhat more confluent airspace opacity in the left lung base concerning for pneumonia. SOFT TISSUES/BONES: No acute abnormality of the bones or soft tissues. UPPER ABDOMEN: Limited images of the upper abdomen demonstrates no acute abnormality. IMPRESSION: 1. Confluent airspace opacity in the left lung base concerning for pneumonia. 2. Trace bilateral pleural effusions. 3. Diffuse coronary artery disease, aortic atherosclerosis. Electronically signed by: Franky Crease MD 11/26/2024 05:17 PM EST RP Workstation: [id number]   DG CHEST PORT 1 VIEW Result Date: 11/26/2024 CLINICAL DATA:  Hypoxia EXAM: PORTABLE CHEST 1 VIEW COMPARISON:  Yesterday FINDINGS: Stable cardiomegaly. Hypoinflation of the lungs. Right internal jugular Port-A-Cath is unchanged. Both lungs are clear. The visualized skeletal structures are unremarkable. IMPRESSION: Hypoinflation of the lungs. No acute abnormality seen. Electronically Signed   By: Lynwood Landy Raddle M.D.   On: 11/26/2024 15:18   DG Chest Portable 1 View Result Date: 11/25/2024 EXAM: 1 VIEW(S) XRAY OF THE CHEST 11/25/2024 01:27:00 PM COMPARISON: 10/14/2024 CLINICAL HISTORY: Cough. FINDINGS: LINES, TUBES AND DEVICES: Power injectable Right chest wall Port-A-Cath in place with tip  overlying the superior caval junction. LUNGS AND PLEURA: Left upper lobe nodule with adjacent fiducial marker. Reduced density around the left upper lobe fiducial compared to previous. Linear subsegmental atelectasis or scarring at the left lung base. No pleural effusion. No pneumothorax. HEART AND MEDIASTINUM: Mild cardiomegaly. Leftward  rotation observed. Aortic calcification. BONES AND SOFT TISSUES: Chronic deformity of the right posterolateral 9th rib. IMPRESSION: 1. Decreased density surrounding the left upper lobe  fiducial marker. 2. Linear subsegmental atelectasis or scarring at the left lung base. 3. Mild cardiomegaly. 4. Right chest wall port-a-cath with tip at the superior cavoatrial junction. 5. Aortic atherosclerotic calcification. 6. Chronic deformity of the right posterolateral ninth rib. Electronically signed by: Ryan Salvage MD 11/25/2024 02:14 PM EST RP Workstation: [id number]      Scheduled Meds:  apixaban   5 mg Per Tube BID   Chlorhexidine  Gluconate Cloth  6 each Topical Daily   diltiazem   60 mg Per Tube Q6H   filgrastim  (NIVESTYM ) SQ  300 mcg Subcutaneous q1800   magic mouthwash w/lidocaine   5 mL Oral QID   metoprolol  tartrate  100 mg Per Tube BID   sodium chloride  flush  10-40 mL Intracatheter Q12H   thiamine   100 mg Per Tube Daily   Continuous Infusions:  sodium chloride  Stopped (11/27/24 0722)   ceFEPime  (MAXIPIME ) IV Stopped (11/26/24 1913)   diltiazem  (CARDIZEM ) infusion Stopped (11/26/24 1416)     LOS: 1 day   Time spent: 35 minutes   Delon Hoe, DO [facility] 11/27/2024, 12:31 PM   Available via Epic secure chat 7am-7pm After these hours, please refer to coverage provider listed on [email or web address]  "

## 2024-11-27 NOTE — Evaluation (Signed)
 Physical Therapy Evaluation Patient Details Name: SHERRI MCARTHY MRN: [id number] DOB: 13-Apr-1946 Today's Date: 11/27/2024  History of Present Illness  Pt is 79 yo female admitted on 11/25/24 for afib with RVR.  Pt with hx including but not limited to A-fib, chronic kidney disease, squamous cell carcinoma of the tongue, hypertension, PVD, chemotherapy induced pancytopenia, recently placed G-tube  Clinical Impression  Pt admitted with above diagnosis. At baseline, pt independent.  She does have support and DME at home if needed.  Today, pt motivated to work with therapy.  She was on 10 L HFNC.  Pt performed multiple transfers, marched in place 30 sec, and ambulated 60' with RW and CGA.  She tolerated well with all VSS.  Pt does have a hx of recent falls.  Expect that pt will progress well with therapy - recommend return home with HHPT at d/c.  Pt currently with functional limitations due to the deficits listed below (see PT Problem List). Pt will benefit from acute skilled PT to increase their independence and safety with mobility to allow discharge.           If plan is discharge home, recommend the following: A little help with walking and/or transfers;A little help with bathing/dressing/bathroom;Assistance with cooking/housework;Help with stairs or ramp for entrance   Can travel by private vehicle        Equipment Recommendations None recommended by PT  Recommendations for Other Services       Functional Status Assessment Patient has had a recent decline in their functional status and demonstrates the ability to make significant improvements in function in a reasonable and predictable amount of time.     Precautions / Restrictions Precautions Precautions: Fall      Mobility  Bed Mobility Overal bed mobility: Needs Assistance Bed Mobility: Supine to Sit     Supine to sit: Contact guard          Transfers Overall transfer level: Needs assistance Equipment used: Rolling  walker (2 wheels) Transfers: Sit to/from Stand, Bed to chair/wheelchair/BSC Sit to Stand: Contact guard assist   Step pivot transfers: Contact guard assist       General transfer comment: STS x 3 during session    Ambulation/Gait Ambulation/Gait assistance: Contact guard assist Gait Distance (Feet): 30 Feet Assistive device: Rolling walker (2 wheels) Gait Pattern/deviations: Step-through pattern, Decreased stride length Gait velocity: decreased   Pre-gait activities: Marched in place 30 sec holding RW General Gait Details: Ambulated back and forth at bedside 30' ; tolerated well with VSS; CGA for safety  Stairs            Wheelchair Mobility     Tilt Bed    Modified Rankin (Stroke Patients Only)       Balance Overall balance assessment: Needs assistance Sitting-balance support: No upper extremity supported Sitting balance-Leahy Scale: Good     Standing balance support: Bilateral upper extremity supported Standing balance-Leahy Scale: Poor Standing balance comment: steady with RW                             Pertinent Vitals/Pain Pain Assessment Pain Assessment: No/denies pain    Home Living Family/patient expects to be discharged to:: Private residence Living Arrangements: Children Available Help at Discharge: Family;Available PRN/intermittently (son is there at night and sometimes during day) Type of Home: House Home Access: Stairs to enter Entrance Stairs-Rails: Left Entrance Stairs-Number of Steps: 4-5   Home Layout: One level Home  Equipment: Pharmacist, Hospital (2 wheels);Hand held shower head;Grab bars - tub/shower      Prior Function Prior Level of Function : Independent/Modified Independent             Mobility Comments: Ambulatory without AD, could ambulate in community; 3 falls in 6 months from tripping ADLs Comments: Ind with ADLs and IADLs     Extremity/Trunk Assessment   Upper Extremity Assessment Upper  Extremity Assessment: Overall WFL for tasks assessed    Lower Extremity Assessment Lower Extremity Assessment: Overall WFL for tasks assessed    Cervical / Trunk Assessment Cervical / Trunk Assessment: Normal  Communication        Cognition Arousal: Alert Behavior During Therapy: WFL for tasks assessed/performed   PT - Cognitive impairments: No apparent impairments                                 Cueing       General Comments General comments (skin integrity, edema, etc.): Pt on 10 L HFNC with O2 sats 98% or > during session; HR maintained 80's and BP stable.    Exercises     Assessment/Plan    PT Assessment Patient needs continued PT services  PT Problem List Decreased strength;Cardiopulmonary status limiting activity;Decreased range of motion;Decreased activity tolerance;Decreased balance;Decreased mobility;Decreased knowledge of use of DME       PT Treatment Interventions DME instruction;Gait training;Stair training;Functional mobility training;Therapeutic activities;Patient/family education;Balance training;Therapeutic exercise    PT Goals (Current goals can be found in the Care Plan section)  Acute Rehab PT Goals Patient Stated Goal: return home PT Goal Formulation: With patient/family Time For Goal Achievement: 12/11/24 Potential to Achieve Goals: Good    Frequency Min 2X/week     Co-evaluation               AM-PAC PT 6 Clicks Mobility  Outcome Measure Help needed turning from your back to your side while in a flat bed without using bedrails?: A Little Help needed moving from lying on your back to sitting on the side of a flat bed without using bedrails?: A Little Help needed moving to and from a bed to a chair (including a wheelchair)?: A Little Help needed standing up from a chair using your arms (e.g., wheelchair or bedside chair)?: A Little Help needed to walk in hospital room?: A Little Help needed climbing 3-5 steps with a  railing? : A Little 6 Click Score: 18    End of Session Equipment Utilized During Treatment: Gait belt Activity Tolerance: Patient tolerated treatment well Patient left: in chair;with call bell/phone within reach;with family/visitor present Nurse Communication: Mobility status PT Visit Diagnosis: Other abnormalities of gait and mobility (R26.89);Muscle weakness (generalized) (M62.81)    Time: 8780-8756 PT Time Calculation (min) (ACUTE ONLY): 24 min   Charges:   PT Evaluation $PT Eval Low Complexity: 1 Low PT Treatments $Therapeutic Activity: 8-22 mins PT General Charges $$ ACUTE PT VISIT: 1 Visit         Benjiman, PT Acute Rehab [facility] [phone]   Benjiman VEAR Mulberry 11/27/2024, 12:53 PM

## 2024-11-27 NOTE — Progress Notes (Signed)
 Brief Nutrition Support Update  Received consult for enteral/tube feeding initiation and management. Patient was seen by RD yesterday and started on [employer] 1.4 @ 87mL/hr. Yesterday evening patient was noted to have copious liquid leaking from her peg site so and of fluid were pulled out of the PEG.  IR consulted to assess PEG which was done today and tube okay to use. Discussed plan with MD. Will plan to restart trickle tube feeds today and assess tolerance. If tolerating, can likely being advancing towards goal starting tomorrow.  Medications reviewed and include: 100mg  thiamine  x7 days Labs reviewed: Creatinine 1.31, Magnesium  1.5  NTERVENTION:  - Today, initiate trickle tube feeds via PEG: [employer] 1.4 @ 12mL/hr   - Monitor magnesium , potassium, and phosphorus daily for at least 3 days, MD to replete as needed, as pt is at risk for refeeding syndrome. -Thiamine  100 mg daily for at least 7 days    - If tolerating trickle tube feeds tomorrow, can likely begin advancing tube feeds to goal: - [employer] 1.4 @ 55 ml/hr *Would recommend increasing to 48mL/hr and the advancing by 10 ml every 12 hours -TF at goal provides 1848 kcals, 81g protein and 937 ml H2O -Recommend free water : 120 ml every 4 hours (720 ml)   Trude Ned RD, LDN Contact via Secure Chat.

## 2024-11-27 NOTE — Progress Notes (Signed)
 Interventional Radiology Brief Note:  APP to bedside for G-tube evaluation due to leaking.  Patient found lying in bed, semi-reclined.  States her G-tube has been leaking and has been often problematic.   Assessment shows a G-tube in place.  Site with some erythema and irritation. Areas of excoriation likely related to early skin breakdown.  Bumper is pulled back ~2cm with 2 layers of gauze underneath.   Re-cinched bumper until it was firm but not compressive against the skin.  Placed gauze around the outside of the bumper (not underneath).  Discussed with RN and encouraged ongoing regular use of barrier cream around the tube for several days as needed to protect the skin.  Will likely need to regularly re-cinch the bumper.  Patient and family at bedside instructed on how to do this.   Khristie Sak, MS RD PA-C

## 2024-11-27 NOTE — TOC Initial Note (Signed)
 Transition of Care [facility]) - Initial/Assessment Note    Patient Details  Name: Taylor Bush MRN: [id number] Date of Birth: 1946-04-10  [facility]) CM/SW Contact:    Jon ONEIDA Anon, RN Phone Number: 11/27/2024, 4:19 PM  Clinical Narrative:                 Pt from home. Came to the ED from the Cancer center with elevated HR up to 140's. Unable to take needed medication due to not being sure how to operate G-tube. Pt admitted with diagnosis of Paroxysmal atrial fibrillation. RNCM met with pt at bedside and she states that she has a RW in the home that she uses at baseline for ambulation. Pt states her tube feedings were set up through the Cancer center. PT evaluated and recommended HH PT at discharge pt agreeable. Will send out referrals for [facility] agencies and await acceptances. ICM following for DC planning needs.    Expected Discharge Plan: Home w Home Health Services Barriers to Discharge: Continued Medical Work up   Patient Goals and CMS Choice Patient states their goals for this hospitalization and ongoing recovery are:: Return home CMS Medicare.gov Compare Post Acute Care list provided to:: Patient Choice offered to / list presented to : Patient [place] ownership interest in [facility].provided to:: Patient    Expected Discharge Plan and Services In-house Referral: NA Discharge Planning Services: CM Consult Post Acute Care Choice: Durable Medical Equipment, Home Health Living arrangements for the past 2 months: Single Family Home                                      Prior Living Arrangements/Services Living arrangements for the past 2 months: Single Family Home Lives with:: Relatives Patient language and need for interpreter reviewed:: Yes Do you feel safe going back to the place where you live?: Yes      Need for Family Participation in Patient Care: Yes (Comment) Care giver support system in place?: Yes (comment) Current home services:  DME Criminal Activity/Legal Involvement Pertinent to Current Situation/Hospitalization: No - Comment as needed  Activities of Daily Living      Permission Sought/Granted Permission sought to share information with : Family Supports Permission granted to share information with : Yes, Verbal Permission Granted  Share Information with NAME: Waylan Perry  Daughter, Emergency Contact  [phone]           Emotional Assessment Appearance:: Appears stated age Attitude/Demeanor/Rapport: Engaged Affect (typically observed): Accepting Orientation: : Oriented to Self, Oriented to Place, Oriented to  Time, Oriented to Situation Alcohol  / Substance Use: Not Applicable Psych Involvement: No (comment)  Admission diagnosis:  A-fib (HCC) [I48.91] Atrial fibrillation with RVR (HCC) [I48.91] Anemia, unspecified type [D64.9] Patient Active Problem List   Diagnosis Date Noted   Pancytopenia (HCC) 11/26/2024   A-fib (HCC) 11/26/2024   Protein-calorie malnutrition, severe 11/26/2024   Chemotherapy induced neutropenia 11/15/2024   Mediastinal adenopathy 10/14/2024   Lung nodule 10/07/2024   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) 09/20/2024   Paroxysmal atrial fibrillation (HCC) 08/21/2024   Peripheral arterial disease 07/12/2024   Dog bite of ankle, sequela 07/06/2024   Gangrene of left foot (HCC) 07/06/2024   B12 deficiency 09/13/2021   Allergic rhinitis    CKD (chronic kidney disease) stage 3, GFR 30-59 ml/min (HCC) 11/29/2013   Anemia of chronic disease 12/16/2011   Atherosclerosis  of native artery of extremity with intermittent claudication 06/17/2009   Lumbar back pain with radiculopathy affecting left lower extremity 06/03/2009   Mixed hyperlipidemia 12/15/2008   Essential hypertension 12/15/2008   Open-angle glaucoma 10/27/2008   PCP:  Watt Mirza, MD Pharmacy:   [facility] [address], [place] - [address] [place]  [address] Phone: [phone] Fax: [phone]     Social Drivers of Health (SDOH) Social History: SDOH Screenings   Food Insecurity: No Food Insecurity (11/26/2024)  Housing: Low Risk (11/26/2024)  Transportation Needs: No Transportation Needs (11/26/2024)  Utilities: Not At Risk (11/26/2024)  Alcohol  Screen: Low Risk (04/29/2024)  Depression (PHQ2-9): Low Risk (11/22/2024)  Financial Resource Strain: Low Risk (04/29/2024)  Physical Activity: Insufficiently Active (04/29/2024)  Social Connections: Moderately Isolated (11/26/2024)  Stress: No Stress Concern Present (04/29/2024)  Tobacco Use: Medium Risk (11/22/2024)  Health Literacy: Adequate Health Literacy (04/29/2024)   SDOH Interventions:     Readmission Risk Interventions    11/27/2024    4:14 PM  Readmission Risk Prevention Plan  Transportation Screening Complete  Medication Review (RN Care Manager) Complete  PCP or Specialist appointment within 3-5 days of discharge Complete  HRI or Home Care Consult Complete  SW Recovery Care/Counseling Consult Complete  Palliative Care Screening Not Applicable  Skilled Nursing Facility Complete

## 2024-11-28 ENCOUNTER — Ambulatory Visit

## 2024-11-28 DIAGNOSIS — I480 Paroxysmal atrial fibrillation: Secondary | ICD-10-CM | POA: Diagnosis not present

## 2024-11-28 LAB — BASIC METABOLIC PANEL WITH GFR
Anion gap: 8 (ref 5–15)
BUN: 40 mg/dL — ABNORMAL HIGH (ref 8–23)
CO2: 23 mmol/L (ref 22–32)
Calcium: 9.3 mg/dL (ref 8.9–10.3)
Chloride: 111 mmol/L (ref 98–111)
Creatinine, Ser: 1.18 mg/dL — ABNORMAL HIGH (ref 0.44–1.00)
GFR, Estimated: 47 mL/min — ABNORMAL LOW
Glucose, Bld: 108 mg/dL — ABNORMAL HIGH (ref 70–99)
Potassium: 4.7 mmol/L (ref 3.5–5.1)
Sodium: 143 mmol/L (ref 135–145)

## 2024-11-28 LAB — GLUCOSE, CAPILLARY
Glucose-Capillary: 103 mg/dL — ABNORMAL HIGH (ref 70–99)
Glucose-Capillary: 110 mg/dL — ABNORMAL HIGH (ref 70–99)
Glucose-Capillary: 113 mg/dL — ABNORMAL HIGH (ref 70–99)
Glucose-Capillary: 114 mg/dL — ABNORMAL HIGH (ref 70–99)
Glucose-Capillary: 95 mg/dL (ref 70–99)
Glucose-Capillary: 99 mg/dL (ref 70–99)

## 2024-11-28 LAB — CBC
HCT: 24 % — ABNORMAL LOW (ref 36.0–46.0)
Hemoglobin: 7.4 g/dL — ABNORMAL LOW (ref 12.0–15.0)
MCH: 30.6 pg (ref 26.0–34.0)
MCHC: 30.8 g/dL (ref 30.0–36.0)
MCV: 99.2 fL (ref 80.0–100.0)
Platelets: 93 K/uL — ABNORMAL LOW (ref 150–400)
RBC: 2.42 MIL/uL — ABNORMAL LOW (ref 3.87–5.11)
RDW: 16.9 % — ABNORMAL HIGH (ref 11.5–15.5)
WBC: 12.9 K/uL — ABNORMAL HIGH (ref 4.0–10.5)
nRBC: 0.5 % — ABNORMAL HIGH (ref 0.0–0.2)

## 2024-11-28 LAB — MAGNESIUM: Magnesium: 2.1 mg/dL (ref 1.7–2.4)

## 2024-11-28 LAB — PHOSPHORUS: Phosphorus: 3.4 mg/dL (ref 2.5–4.6)

## 2024-11-28 MED ORDER — DILTIAZEM HCL 30 MG PO TABS
30.0000 mg | ORAL_TABLET | Freq: Four times a day (QID) | ORAL | Status: DC
  Administered 2024-11-28 – 2024-11-29 (×5): 30 mg
  Filled 2024-11-28 (×5): qty 1

## 2024-11-28 MED ORDER — CHLORHEXIDINE GLUCONATE CLOTH 2 % EX PADS: 6.0000 | MEDICATED_PAD | Freq: Every day | CUTANEOUS | Status: DC

## 2024-11-28 MED ORDER — ORAL CARE MOUTH RINSE
15.0000 mL | OROMUCOSAL | Status: DC
  Administered 2024-11-29 (×2): 15 mL via OROMUCOSAL

## 2024-11-28 MED ORDER — FREE WATER
120.0000 mL | Status: DC
  Administered 2024-11-28 – 2024-11-29 (×7): 120 mL

## 2024-11-28 MED ORDER — ORAL CARE MOUTH RINSE: 15.0000 mL | OROMUCOSAL | Status: DC | PRN

## 2024-11-28 MED ORDER — ORAL CARE MOUTH RINSE
15.0000 mL | OROMUCOSAL | Status: DC
  Administered 2024-11-28: 15 mL via OROMUCOSAL

## 2024-11-28 MED ORDER — CLOPIDOGREL BISULFATE 75 MG PO TABS
75.0000 mg | ORAL_TABLET | Freq: Every day | ORAL | Status: DC
  Administered 2024-11-29: 75 mg
  Filled 2024-11-28: qty 1

## 2024-11-28 MED ORDER — KATE FARMS STANDARD 1.4 EN LIQD
1000.0000 mL | ENTERAL | Status: DC
  Administered 2024-11-28: 1000 mL
  Filled 2024-11-28 (×3): qty 1000

## 2024-11-28 MED ORDER — METOPROLOL TARTRATE 25 MG PO TABS
50.0000 mg | ORAL_TABLET | Freq: Two times a day (BID) | ORAL | Status: DC
  Administered 2024-11-28 – 2024-11-29 (×3): 50 mg
  Filled 2024-11-28 (×3): qty 2

## 2024-11-28 NOTE — TOC Progression Note (Signed)
 Transition of Care [facility]) - Progression Note    Patient Details  Name: Taylor Bush MRN: [id number] Date of Birth: Jan 25, 1946  [facility]) CM/SW Contact  Jon ONEIDA Anon, RN Phone Number: 11/28/2024, 10:30 AM  Clinical Narrative:    RNCM spoke with Artavia, liaison for [facility] and she has agreed to accept pt for [facility] PT/RN services at discharge. HH orders have been placed. ICM continuing to follow for DC planning needs.   Expected Discharge Plan: Home w Home Health Services Barriers to Discharge: Continued Medical Work up               Expected Discharge Plan and Services In-house Referral: NA Discharge Planning Services: CM Consult Post Acute Care Choice: Durable Medical Equipment, Home Health Living arrangements for the past 2 months: Single Family Home                                       Social Drivers of Health (SDOH) Interventions SDOH Screenings   Food Insecurity: No Food Insecurity (11/26/2024)  Housing: Low Risk (11/26/2024)  Transportation Needs: No Transportation Needs (11/26/2024)  Utilities: Not At Risk (11/26/2024)  Alcohol  Screen: Low Risk (04/29/2024)  Depression (PHQ2-9): Low Risk (11/22/2024)  Financial Resource Strain: Low Risk (04/29/2024)  Physical Activity: Insufficiently Active (04/29/2024)  Social Connections: Moderately Isolated (11/26/2024)  Stress: No Stress Concern Present (04/29/2024)  Tobacco Use: Medium Risk (11/22/2024)  Health Literacy: Adequate Health Literacy (04/29/2024)    Readmission Risk Interventions    11/27/2024    4:14 PM  Readmission Risk Prevention Plan  Transportation Screening Complete  Medication Review (RN Care Manager) Complete  PCP or Specialist appointment within 3-5 days of discharge Complete  HRI or Home Care Consult Complete  SW Recovery Care/Counseling Consult Complete  Palliative Care Screening Not Applicable  Skilled Nursing Facility Complete

## 2024-11-28 NOTE — Progress Notes (Signed)
 Nutrition Follow-up  DOCUMENTATION CODES:   Severe malnutrition in context of chronic illness  INTERVENTION:  - Begin increasing tube feeds to goal today via PEG: Kate Farms 1.4 @ 55 ml/hr *Increase to 38mL/hr and the advance by 10 ml every 12 hours + 120 ml FWF every 4 hours (720 ml) TF at goal provides 1848 kcals, 81g protein and 1658 ml H2O  Monitor magnesium , potassium, and phosphorus daily for at least 3 days, MD to replete as needed, as pt is at risk for refeeding syndrome. - Thiamine  100 mg daily for at least 7 days   At discharge, if tolerates [employer], recommend: 5 cartons of Compleat 1.4 daily Provides 1750 kcals, 90g protein and 970 ml H2O Flush with 60 ml free water  before and after each feeding If needs additional free water : 100 ml BID   NUTRITION DIAGNOSIS:   Severe Malnutrition related to chronic illness, cancer and cancer related treatments as evidenced by percent weight loss, energy intake < or equal to 75% for > or equal to 1 month, moderate fat depletion, severe muscle depletion. *ongoing  GOAL:   Patient will meet greater than or equal to 90% of their needs *progressing, on TF  MONITOR:   TF tolerance  REASON FOR ASSESSMENT:   Consult Enteral/tube feeding initiation and management  ASSESSMENT:   79 y.o. female with medical history significant of paroxysmal afib on eliquis , CKD3, HTN, PVD, squamous of carcinoma of the tongue status post left partial glossectomy on chemo therapy and radiation with chemotherapy induced thrombocytopenia, anemia, neutropenia, G-tube dependant, sent to the ED from cancer center after she was noted to be in afib RVR.   She admits she has not taken her medications (including eliquis  and metoprolol ) in 3 days because she was never instructed on how to administer them through her G-tube.   ROS positive for non-bloody diarrhea (which has been present since she started tube feeds), and sore throat which is chronic 2/2  mucositis.  Patient reports tolerating trickle tube feeds of [employer] well with no issues. RN at bedside reports there has been a small amount of drainage from around the tube but much improved.  Discussed with MD, will begin advancing tube feeds slowly to goal. Patient also agreeable to plan.   Patient had SLP eval today and recommended continued NPO but plan for MBS tomorrow.     Medications reviewed and include: 100mg  thiamine  x7 days (ends 1/26)  Labs reviewed:  Creatinine 1.18  Diet Order:   Diet Order             Diet NPO time specified Except for: Ice Chips  Diet effective now                   EDUCATION NEEDS:  Education needs have been addressed  Skin:  Skin Assessment: Skin Integrity Issues: Skin Integrity Issues:: Other (Comment) Other: left foot wound from dog bite in Sept 2025  Last BM:  1/21 - type 7  Height:  Ht Readings from Last 1 Encounters:  11/25/24 5' (1.524 m)   Weight:  Wt Readings from Last 1 Encounters:  11/28/24 59.8 kg   BMI:  Body mass index is 25.75 kg/m.  Estimated Nutritional Needs:  Kcal:  1750-1950 Protein:  85-95g Fluid:  1.9L/day    Trude Ned RD, LDN Contact via Secure Chat.

## 2024-11-28 NOTE — Plan of Care (Signed)
" °  Problem: Education: Goal: Knowledge of General Education information will improve Description: Including pain rating scale, medication(s)/side effects and non-pharmacologic comfort measures Outcome: Progressing   Problem: Health Behavior/Discharge Planning: Goal: Ability to manage health-related needs will improve Outcome: Progressing   Problem: Clinical Measurements: Goal: Ability to maintain clinical measurements within normal limits will improve Outcome: Progressing Goal: Will remain free from infection Outcome: Progressing Goal: Diagnostic test results will improve Outcome: Progressing Goal: Respiratory complications will improve Outcome: Progressing Goal: Cardiovascular complication will be avoided Outcome: Progressing   Problem: Activity: Goal: Risk for activity intolerance will decrease Outcome: Progressing   Problem: Nutrition: Goal: Adequate nutrition will be maintained Outcome: Progressing   Problem: Coping: Goal: Level of anxiety will decrease Outcome: Progressing   Problem: Elimination: Goal: Will not experience complications related to bowel motility Outcome: Progressing Goal: Will not experience complications related to urinary retention Outcome: Progressing   Problem: Pain Managment: Goal: General experience of comfort will improve and/or be controlled Outcome: Progressing   Problem: Safety: Goal: Ability to remain free from injury will improve Outcome: Progressing   Problem: Skin Integrity: Goal: Risk for impaired skin integrity will decrease Outcome: Progressing   Problem: Activity: Goal: Ability to tolerate increased activity will improve Outcome: Progressing   Problem: Clinical Measurements: Goal: Ability to maintain a body temperature in the normal range will improve Outcome: Progressing   Problem: Respiratory: Goal: Ability to maintain adequate ventilation will improve Outcome: Progressing Goal: Ability to maintain a clear airway  will improve Outcome: Progressing   Cindy S. Loreli BSN, RN, CCRP, CCRN 11/28/2024 3:40 AM "

## 2024-11-28 NOTE — Progress Notes (Signed)
 " PROGRESS NOTE    Taylor Bush  FMW:[id number] DOB: [birth year] DOA: 11/25/2024 PCP: Watt Mirza, MD     Brief Narrative:  Taylor Bush is a 79 y.o. female with medical history significant of paroxysmal afib on eliquis , CKD3, HTN, PVD, squamous of carcinoma of the tongue status post left partial glossectomy on chemo therapy and radiation with chemotherapy induced thrombocytopenia, anemia, neutropenia, G-tube dependant, sent to the ED from cancer center after she was noted to be in A Fib RVR.   She admits she has not taken her medications (including eliquis  and metoprolol ) in 3 days because she was never instructed on how to administer them through her G-tube.   In the ED, she was in afib RVR rate up to 150. BP stable. HGB 6.4, WBC 1.3. 1 unit PRBC ordered to be transfused. She was given  10mg  IV metoprolol  without significant improvement in her rate, started on diltiazem  drip and hospitalist consulted for admission.   New events last 24 hours / Subjective: Patient feeling well overall.  Patient had low blood pressure overnight.  She remains asymptomatic, denies any lightheadedness or dizziness.  She states that since her tongue cancer treatment, she has not been taking much by mouth.  Heart rate remains stable in the 70s.  Assessment & Plan:   Principal Problem:   Paroxysmal atrial fibrillation (HCC) Active Problems:   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)   Pancytopenia (HCC)   A-fib (HCC)   Protein-calorie malnutrition, severe   Paroxysmal A Fib RVR - Cardizem  drip --> Cardizem  and Lopressor  by tube.  Dose decreased due to hypotension - Eliquis  - HR controlled this morning in the 70s  Malignant neoplasm of tongue with mucositis - Status post left partial glossectomy 06/17/2024 and neck dissection 08/21/2024 - Followed by Dr. Autumn  - Currently on chemoradiation - PEG for nutrition and medication administration - Dietitian consult for TF   Left-sided  pneumonia - Influenza/RSV/Covid negative  - MRSA PCR negative, discontinue Vanco - Continue cefepime    Pancytopenia - In setting of chemoradiation - S/p 1 unit prbc transfusion - Filgrastim  x 2  AKI on CKD stage 3b - Improved   HTN - Norvasc , lisinopril  currently on hold  PAD  - Resume Plavix   DVT prophylaxis:  apixaban  (ELIQUIS ) tablet 5 mg  Code Status: Full Family Communication: Daughter at bedside Disposition Plan: Home with home health  Status is: Inpatient Remains inpatient appropriate because: IV antibiotics, low blood pressure today    Antimicrobials:  Anti-infectives (From admission, onward)    Start     Dose/Rate Route Frequency Ordered Stop   11/26/24 1830  ceFEPIme  (MAXIPIME ) 2 g in sodium chloride  0.9 % 100 mL IVPB        2 g 200 mL/hr over 30 Minutes Intravenous Every 24 hours 11/26/24 1753 12/01/24 1829   11/26/24 1000  azithromycin  (ZITHROMAX ) tablet 250 mg       Note to Pharmacy: Take 2 tablets by mouth on day 1, then 1 tablet daily until complete     250 mg Per Tube Daily 11/25/24 1757 11/27/24 1111   11/25/24 1700  azithromycin  (ZITHROMAX ) tablet 250 mg  Status:  Discontinued       Note to Pharmacy: Take 2 tablets by mouth on day 1, then 1 tablet daily until complete     250 mg Oral Daily 11/25/24 1700 11/25/24 1757        Objective: Vitals:   11/28/24 0635 11/28/24 0700 11/28/24 0800  11/28/24 0900  BP: (!) 99/54 (!) 99/42 (!) 101/41 (!) 86/58  Pulse: 73 73 72 69  Resp: 14 13 17 19   Temp:   97.6 F (36.4 C)   TempSrc:   Oral   SpO2: 95% 91% 90% 97%  Weight:      Height:        Intake/Output Summary (Last 24 hours) at 11/28/2024 1019 Last data filed at 11/28/2024 0800 Gross per 24 hour  Intake 549.24 ml  Output --  Net 549.24 ml   Filed Weights   11/25/24 1223 11/28/24 0406  Weight: 57.9 kg 59.8 kg    Examination:  General exam: Appears calm and comfortable  Respiratory system: Clear to auscultation. Respiratory effort normal.  No respiratory distress. No conversational dyspnea. On [place] O2  Cardiovascular system: S1 & S2 heard. No murmurs. No pedal edema. Gastrointestinal system: Abdomen is nondistended, soft and nontender Central nervous system: Alert and oriented. No focal neurological deficits. Speech clear.  Psychiatry: Judgement and insight appear normal. Mood & affect appropriate.   Data Reviewed: I have personally reviewed following labs and imaging studies  CBC: Recent Labs  Lab 11/22/24 0905 11/25/24 1324 11/26/24 0515 11/27/24 0517 11/28/24 0404  WBC 1.0* 1.3* 2.1* 5.0 12.9*  NEUTROABS 0.7* 0.5*  --   --   --   HGB 7.8* 6.4* 7.6* 7.6* 7.4*  HCT 23.0* 20.0* 22.9* 24.9* 24.0*  MCV 92.4 96.2 95.0 100.4* 99.2  PLT 131* 84* 82* 97* 93*   Basic Metabolic Panel: Recent Labs  Lab 11/22/24 0905 11/25/24 1324 11/26/24 0515 11/27/24 0517 11/28/24 0404  NA 132* 139 141 144 143  K 5.1 4.7 4.5 4.4 4.7  CL 99 106 109 111 111  CO2 20* 20* 21* 22 23  GLUCOSE 126* 110* 112* 104* 108*  BUN 44* 47* 36* 38* 40*  CREATININE 1.51* 1.78* 1.31* 1.31* 1.18*  CALCIUM  8.3* 8.9 8.9 9.4 9.3  MG  --   --  1.5* 1.5* 2.1  PHOS  --   --   --  3.6 3.4   GFR: Estimated Creatinine Clearance: 31.8 mL/min (A) (by C-G formula based on SCr of 1.18 mg/dL (H)). Liver Function Tests: Recent Labs  Lab 11/22/24 0905  AST 18  ALT 13  ALKPHOS 58  BILITOT 0.7  PROT 6.9  ALBUMIN 3.8   No results for input(s): LIPASE, AMYLASE in the last 168 hours. No results for input(s): AMMONIA in the last 168 hours. Coagulation Profile: No results for input(s): INR, PROTIME in the last 168 hours. Cardiac Enzymes: No results for input(s): CKTOTAL, CKMB, CKMBINDEX, TROPONINI in the last 168 hours. BNP (last 3 results) No results for input(s): PROBNP in the last 8760 hours. HbA1C: No results for input(s): HGBA1C in the last 72 hours. CBG: Recent Labs  Lab 11/27/24 1547 11/27/24 1944 11/28/24 0024  11/28/24 0352 11/28/24 0744  GLUCAP 81 106* 95 99 114*   Lipid Profile: No results for input(s): CHOL, HDL, LDLCALC, TRIG, CHOLHDL, LDLDIRECT in the last 72 hours. Thyroid  Function Tests: Recent Labs    11/26/24 0515  TSH 0.763   Anemia Panel: No results for input(s): VITAMINB12, FOLATE, FERRITIN, TIBC, IRON , RETICCTPCT in the last 72 hours. Sepsis Labs: Recent Labs  Lab 11/26/24 1831  PROCALCITON 0.80    Recent Results (from the past 240 hours)  MRSA Next Gen by PCR, Nasal     Status: None   Collection Time: 11/25/24  6:01 PM   Specimen: Nasal Mucosa; Nasal Swab  Result Value Ref Range Status   MRSA by PCR Next Gen NOT DETECTED NOT DETECTED Final    Comment: (NOTE) The GeneXpert MRSA Assay (FDA approved for NASAL specimens only), is one component of a comprehensive MRSA colonization surveillance program. It is not intended to diagnose MRSA infection nor to guide or monitor treatment for MRSA infections. Test performance is not FDA approved in patients less than 57 years old. Performed at [facility], [address], [place], [place] [address]   Resp panel by RT-PCR (RSV, Flu A&B, Covid) Anterior Nasal Swab     Status: None   Collection Time: 11/26/24  6:31 PM   Specimen: Anterior Nasal Swab  Result Value Ref Range Status   SARS Coronavirus 2 by RT PCR NEGATIVE NEGATIVE Final    Comment: (NOTE) SARS-CoV-2 target nucleic acids are NOT DETECTED.  The SARS-CoV-2 RNA is generally detectable in upper respiratory specimens during the acute phase of infection. The lowest concentration of SARS-CoV-2 viral copies this assay can detect is 138 copies/mL. A negative result does not preclude SARS-Cov-2 infection and should not be used as the sole basis for treatment or other patient management decisions. A negative result may occur with  improper specimen collection/handling, submission of specimen other than nasopharyngeal swab,  presence of viral mutation(s) within the areas targeted by this assay, and inadequate number of viral copies(<138 copies/mL). A negative result must be combined with clinical observations, patient history, and epidemiological information. The expected result is Negative.  Fact Sheet for Patients:  bloggercourse.com  Fact Sheet for Healthcare Providers:  seriousbroker.it  This test is no t yet approved or cleared by the [place]  FDA and  has been authorized for detection and/or diagnosis of SARS-CoV-2 by FDA under an Emergency Use Authorization (EUA). This EUA will remain  in effect (meaning this test can be used) for the duration of the COVID-19 declaration under Section 564(b)(1) of the Act, 21 U.S.C.section 360bbb-3(b)(1), unless the authorization is terminated  or revoked sooner.       Influenza A by PCR NEGATIVE NEGATIVE Final   Influenza B by PCR NEGATIVE NEGATIVE Final    Comment: (NOTE) The Xpert Xpress SARS-CoV-2/FLU/RSV plus assay is intended as an aid in the diagnosis of influenza from Nasopharyngeal swab specimens and should not be used as a sole basis for treatment. Nasal washings and aspirates are unacceptable for Xpert Xpress SARS-CoV-2/FLU/RSV testing.  Fact Sheet for Patients: bloggercourse.com  Fact Sheet for Healthcare Providers: seriousbroker.it  This test is not yet approved or cleared by the [place]  FDA and has been authorized for detection and/or diagnosis of SARS-CoV-2 by FDA under an Emergency Use Authorization (EUA). This EUA will remain in effect (meaning this test can be used) for the duration of the COVID-19 declaration under Section 564(b)(1) of the Act, 21 U.S.C. section 360bbb-3(b)(1), unless the authorization is terminated or revoked.     Resp Syncytial Virus by PCR NEGATIVE NEGATIVE Final    Comment: (NOTE) Fact Sheet for  Patients: bloggercourse.com  Fact Sheet for Healthcare Providers: seriousbroker.it  This test is not yet approved or cleared by the [place]  FDA and has been authorized for detection and/or diagnosis of SARS-CoV-2 by FDA under an Emergency Use Authorization (EUA). This EUA will remain in effect (meaning this test can be used) for the duration of the COVID-19 declaration under Section 564(b)(1) of the Act, 21 U.S.C. section 360bbb-3(b)(1), unless the authorization is terminated or revoked.  Performed at [facility], [address] [place], [place] [address]   Culture, blood (Routine X 2) w Reflex to ID Panel     Status: None (Preliminary result)   Collection Time: 11/26/24  7:12 PM   Specimen: BLOOD RIGHT HAND  Result Value Ref Range Status   Specimen Description   Final    BLOOD RIGHT HAND Performed at [facility], [address], [place], [place] [address]    Special Requests   Final    BOTTLES DRAWN AEROBIC ONLY Blood Culture results may not be optimal due to an inadequate volume of blood received in culture bottles Performed at [facility], [address] [place], [place] [address]    Culture   Final    NO GROWTH 2 DAYS Performed at [facility], [address], [place], [place] [address]    Report Status PENDING  Incomplete  Culture, blood (Routine X 2) w Reflex to ID Panel     Status: None (Preliminary result)   Collection Time: 11/26/24  7:17 PM   Specimen: BLOOD RIGHT HAND  Result Value Ref Range Status   Specimen Description   Final    BLOOD RIGHT HAND Performed at [facility], [address] [place], [place] [address]    Special Requests   Final    BOTTLES DRAWN AEROBIC ONLY Blood Culture results may not be optimal due to an inadequate volume of blood received in culture bottles Performed at [facility], [address]  [place], [place] [address]    Culture   Final    NO GROWTH 2 DAYS Performed at [facility], [address], [place], [place] [address]    Report Status PENDING  Incomplete      Radiology Studies: CT CHEST WO CONTRAST Result Date: 11/26/2024 EXAM: CT CHEST WITHOUT CONTRAST 11/26/2024 05:11:00 PM TECHNIQUE: CT of the chest was performed without the administration of intravenous contrast. Multiplanar reformatted images are provided for review. Automated exposure control, iterative reconstruction, and/or weight based adjustment of the mA/kV was utilized to reduce the radiation dose to as low as reasonably achievable. COMPARISON: 10/11/2024 CLINICAL HISTORY: Respiratory illness, nondiagnostic xray FINDINGS: MEDIASTINUM: Diffuse coronary artery and aortic atherosclerosis. LYMPH NODES: Mildly prominent mediastinal lymph nodes are stable since the prior study and likely reactive. No axillary or hilar adenopathy. LUNGS AND PLEURA: Trace bilateral pleural effusions. Right basilar atelectasis. Somewhat more confluent airspace opacity in the left lung base concerning for pneumonia. SOFT TISSUES/BONES: No acute abnormality of the bones or soft tissues. UPPER ABDOMEN: Limited images of the upper abdomen demonstrates no acute abnormality. IMPRESSION: 1. Confluent airspace opacity in the left lung base concerning for pneumonia. 2. Trace bilateral pleural effusions. 3. Diffuse coronary artery disease, aortic atherosclerosis. Electronically signed by: Franky Crease MD 11/26/2024 05:17 PM EST RP Workstation: [id number]   DG CHEST PORT 1 VIEW Result Date: 11/26/2024 CLINICAL DATA:  Hypoxia EXAM: PORTABLE CHEST 1 VIEW COMPARISON:  Yesterday FINDINGS: Stable cardiomegaly. Hypoinflation of the lungs. Right internal jugular Port-A-Cath is unchanged. Both lungs are clear. The visualized skeletal structures are unremarkable. IMPRESSION: Hypoinflation of the lungs. No acute abnormality seen. Electronically Signed   By: Lynwood Landy Raddle M.D.   On: 11/26/2024 15:18      Scheduled Meds:  apixaban   5 mg Per Tube BID   Chlorhexidine  Gluconate Cloth  6 each Topical Daily   [START ON 11/29/2024] clopidogrel   75 mg Per Tube Q breakfast   diltiazem   30 mg Per Tube Q6H   feeding supplement (KATE FARMS STANDARD ENT 1.4)  1,000 mL Per Tube Q24H   magic mouthwash w/lidocaine   5 mL Oral QID   metoprolol  tartrate  50 mg Per Tube BID   sodium chloride  flush  10-40 mL Intracatheter Q12H   thiamine   100 mg Per Tube Daily   Continuous Infusions:  ceFEPime  (MAXIPIME ) IV Stopped (11/27/24 1908)     LOS: 2 days   Time spent: 25 minutes   Delon Hoe, DO [facility] 11/28/2024, 10:19 AM   Available via Epic secure chat 7am-7pm After these hours, please refer to coverage provider listed on [email or web address]  "

## 2024-11-28 NOTE — Evaluation (Addendum)
 Clinical/Bedside Swallow Evaluation Patient Details  Name: Taylor Bush MRN: [id number] Date of Birth: September 11, 1946  Today's Date: 11/28/2024 Time: SLP Start Time (ACUTE ONLY): 1015 SLP Stop Time (ACUTE ONLY): 1035 SLP Time Calculation (min) (ACUTE ONLY): 20 min  Past Medical History:  Past Medical History:  Diagnosis Date   Allergic rhinitis    Anemia in chronic kidney disease (CKD)    B12 deficiency 09/13/2021   Blood transfusion 2012   and 2013   Chronic low back pain    CKD (chronic kidney disease) stage 3, GFR 30-59 ml/min (HCC) 11/29/2013   HLD (hyperlipidemia)    HTN (hypertension)    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) 09/20/2024   Open-angle glaucoma    PAD (peripheral artery disease)    with intermittent claudication, ABI 0.72 on Rt and 0.73 on L when assessed in 2010.    Paroxysmal atrial fibrillation (HCC) 08/21/2024   Past Surgical History:  Past Surgical History:  Procedure Laterality Date   ABDOMINAL AORTOGRAM W/LOWER EXTREMITY N/A 07/09/2024   Procedure: ABDOMINAL AORTOGRAM W/LOWER EXTREMITY;  Surgeon: Serene Gaile ORN, MD;  Location: [facility];  Service: Cardiovascular;  Laterality: N/A;   abnormal vascular studies  05/07/2009   ABI's 70, R SFA > 50%^ blockage    APPLICATION OF WOUND VAC Left 07/12/2024   Procedure: APPLICATION, WOUND VAC;  Surgeon: Harden Jerona GAILS, MD;  Location: [facility];  Service: Orthopedics;  Laterality: Left;   CATARACT EXTRACTION, BILATERAL  2011   EXCISION OF TONGUE LESION WITH LASER N/A 08/31/2015   Procedure: BIOPSY AND CO2 LASER OF TONGUE LESION ;  Surgeon: Ida Loader, MD;  Location: [facility];  Service: ENT;  Laterality: N/A;   EXCISION OF TONGUE LESION WITH LASER Left 06/29/2020   Procedure: EXCISION OF TONGUE LESION/CO2 laser w/biopsy;  Surgeon: Loader Ida, MD;  Location: [facility];  Service: ENT;  Laterality: Left;   EXCISION OF TONGUE LESION WITH LASER Bilateral 10/02/2023    Procedure: ORAL BIOPSY AND CO2 LASER ABLATATION OF TONGUE LESION;  Surgeon: Loader Ida, MD;  Location: [facility];  Service: ENT;  Laterality: Bilateral;   EXCISION ORAL LESION WITH CO2 LASER N/A 12/08/2021   Procedure: EXCISION OF ORAL ORAL CAVITY LESION WITH CO2 LASER;  Surgeon: Loader Ida, MD;  Location: [facility];  Service: ENT;  Laterality: N/A;   EYE SURGERY     GLOSSECTOMY, PARTIAL Left 06/17/2024   Procedure: GLOSSECTOMY, PARTIAL;  Surgeon: Loader Ida, MD;  Location: [facility];  Service: ENT;  Laterality: Left;   INCISION AND DRAINAGE OF WOUND Left 07/12/2024   Procedure: IRRIGATION AND DEBRIDEMENT WOUND;  Surgeon: Harden Jerona GAILS, MD;  Location: [facility];  Service: Orthopedics;  Laterality: Left;  LEFT FOOT DEBRIDEMENT   IR GASTROSTOMY TUBE MOD SED  11/01/2024   IR IMAGING GUIDED PORT INSERTION  11/01/2024   JOINT REPLACEMENT     LOWER EXTREMITY ANGIOGRAPHY  07/09/2024   Procedure: Lower Extremity Angiography;  Surgeon: Serene Gaile ORN, MD;  Location: [facility];  Service: Cardiovascular;;   LOWER EXTREMITY INTERVENTION  07/09/2024   Procedure: LOWER EXTREMITY INTERVENTION;  Surgeon: Serene Gaile ORN, MD;  Location: [facility];  Service: Cardiovascular;;   OTHER SURGICAL HISTORY     ganglion cyst removed left wrist    PERIPHERAL INTRAVASCULAR LITHOTRIPSY  07/09/2024   Procedure: PERIPHERAL INTRAVASCULAR LITHOTRIPSY;  Surgeon: Serene Gaile ORN, MD;  Location: [facility];  Service: Cardiovascular;;  PERIPHERAL VASCULAR ATHERECTOMY  07/09/2024   Procedure: PERIPHERAL VASCULAR ATHERECTOMY;  Surgeon: Serene Gaile ORN, MD;  Location: [facility];  Service: Cardiovascular;;   RADICAL NECK DISSECTION Left 08/21/2024   Procedure: DISSECTION, NECK, RADICAL;  Surgeon: Jesus Oliphant, MD;  Location: [facility] OR;  Service: ENT;  Laterality: Left;   TOTAL HIP ARTHROPLASTY  10/03/2011   Procedure: TOTAL HIP ARTHROPLASTY;  Surgeon: Dempsey LULLA Moan;  Location: [facility];   Service: Orthopedics;  Laterality: Right;   TOTAL HIP ARTHROPLASTY  12/21/2011   Procedure: TOTAL HIP ARTHROPLASTY;  Surgeon: Dempsey LULLA Moan, MD;  Location: [facility];  Service: Orthopedics;  Laterality: Left;   TUBAL LIGATION     VIDEO BRONCHOSCOPY WITH ENDOBRONCHIAL NAVIGATION Left 10/14/2024   Procedure: VIDEO BRONCHOSCOPY WITH ENDOBRONCHIAL NAVIGATION;  Surgeon: Shelah Lamar RAMAN, MD;  Location: [facility];  Service: Pulmonary;  Laterality: Left;   VIDEO BRONCHOSCOPY WITH ENDOBRONCHIAL ULTRASOUND Bilateral 10/14/2024   Procedure: BRONCHOSCOPY, WITH EBUS;  Surgeon: Shelah Lamar RAMAN, MD;  Location: [facility];  Service: Pulmonary;  Laterality: Bilateral;   HPI:  79 yo female presenting to ED 1/19 from cancer center after she was noted to be in A-fib with RVR. CT Chest shows confluent airspace opacity in the L lung base concerning for pneumonia with trace bilateral pleural effusions. 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 S/p partial glossectomy 06/17/24. PEG placed 11/01/24. Established with OP SLP 11/21/24 and provided HEP. Other notable PMH includes paroxysmal A-fib on Eliquis , CKD 3, HTN, PVD, chemotherapy induced thrombocytopenia, anemia, neutropenia    Assessment / Plan / Recommendation  Clinical Impression  Can offer ice chips or sips of water  with frequent oral care (as tolerated with mucositis). Primary nutrition/medication can be given via PEG. SLP will f/u for MBS as scheduling allows.   Pt's vocal quality is hoarse and rough but her cough is forceful. Reduced L lingual ROM noted s/p partial glossectomy. She presents with diffuse mucositis and odynophagia that has progressed since starting XRT 11/04/24. Small sips of water  are followed by multiple swallows and intermittent coughing. Coughing is often productive of secretions that she expectorates using suction. Discussed acute pna and dysphagia during course of XRT. Reviewed HEP provided by OP SLP, encouraging more  consistent practice. Pt is interested in proceeding with an MBS for further assessment, stating that she does not wish to do any further XRT and would like to evaluate swallowing before returning home.   SLP Visit Diagnosis: Dysphagia, unspecified (R13.10)    Aspiration Risk  Moderate aspiration risk    Diet Recommendation           Other Recommendations Oral Care Recommendations: Oral care QID;Oral care prior to ice chip/H20     Swallow Evaluation Recommendations Recommendations: NPO;Ice chips PRN after oral care Medication Administration: Via alternative means Oral care recommendations: Oral care QID (4x/day);Oral care before ice chips/water    Assistance Recommended at Discharge    Functional Status Assessment Patient has had a recent decline in their functional status and demonstrates the ability to make significant improvements in function in a reasonable and predictable amount of time.  Frequency and Duration min 2x/week  1 week       Prognosis Prognosis for improved oropharyngeal function: Good Barriers to Reach Goals: Severity of deficits      Swallow Study   General HPI: 79 yo female presenting to ED 1/19 from cancer center after she was noted to be in A-fib with RVR. CT Chest shows confluent airspace  opacity in the L lung base concerning for pneumonia with trace bilateral pleural effusions. 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 S/p partial glossectomy 06/17/24. PEG placed 11/01/24. Established with OP SLP 11/21/24 and provided HEP. Other notable PMH includes paroxysmal A-fib on Eliquis , CKD 3, HTN, PVD, chemotherapy induced thrombocytopenia, anemia, neutropenia Type of Study: Bedside Swallow Evaluation Previous Swallow Assessment: see HPI Diet Prior to this Study: NPO;G-tube Temperature Spikes Noted: No Respiratory Status: Nasal cannula History of Recent Intubation: No Behavior/Cognition: Alert;Cooperative;Pleasant mood Oral Cavity Assessment:  Other (comment) (mucositis) Oral Care Completed by SLP: No Oral Cavity - Dentition: Adequate natural dentition Vision: Functional for self-feeding Self-Feeding Abilities: Able to feed self Patient Positioning: Upright in bed Baseline Vocal Quality: Hoarse;Other (comment) (rough) Volitional Cough: Strong;Congested Volitional Swallow: Able to elicit    Oral/Motor/Sensory Function Overall Oral Motor/Sensory Function: Mild impairment Facial ROM: Within Functional Limits Facial Symmetry: Within Functional Limits Facial Strength: Within Functional Limits Facial Sensation: Within Functional Limits Lingual ROM: Reduced left Lingual Symmetry: Abnormal symmetry left Lingual Strength: Reduced Lingual Sensation: Within Functional Limits   Ice Chips Ice chips: Not tested   Thin Liquid Thin Liquid: Impaired Presentation: Straw;Self Fed Pharyngeal  Phase Impairments: Multiple swallows;Cough - Immediate    Nectar Thick Nectar Thick Liquid: Not tested   Honey Thick Honey Thick Liquid: Not tested   Puree Puree: Not tested   Solid     Solid: Not tested      Damien Blumenthal, M.A., CCC-SLP Speech Language Pathology, Acute Rehabilitation Services  Secure Chat preferred [phone]  11/28/2024,11:26 AM

## 2024-11-29 ENCOUNTER — Other Ambulatory Visit: Payer: Self-pay

## 2024-11-29 ENCOUNTER — Telehealth: Payer: Self-pay | Admitting: Oncology

## 2024-11-29 ENCOUNTER — Other Ambulatory Visit (HOSPITAL_COMMUNITY): Payer: Self-pay

## 2024-11-29 ENCOUNTER — Ambulatory Visit

## 2024-11-29 ENCOUNTER — Inpatient Hospital Stay: Admitting: Oncology

## 2024-11-29 ENCOUNTER — Inpatient Hospital Stay

## 2024-11-29 ENCOUNTER — Inpatient Hospital Stay (HOSPITAL_COMMUNITY)

## 2024-11-29 ENCOUNTER — Inpatient Hospital Stay: Admitting: Dietician

## 2024-11-29 LAB — BASIC METABOLIC PANEL WITH GFR
Anion gap: 8 (ref 5–15)
BUN: 33 mg/dL — ABNORMAL HIGH (ref 8–23)
CO2: 24 mmol/L (ref 22–32)
Calcium: 9.3 mg/dL (ref 8.9–10.3)
Chloride: 111 mmol/L (ref 98–111)
Creatinine, Ser: 1.07 mg/dL — ABNORMAL HIGH (ref 0.44–1.00)
GFR, Estimated: 53 mL/min — ABNORMAL LOW
Glucose, Bld: 121 mg/dL — ABNORMAL HIGH (ref 70–99)
Potassium: 4.3 mmol/L (ref 3.5–5.1)
Sodium: 144 mmol/L (ref 135–145)

## 2024-11-29 LAB — GLUCOSE, CAPILLARY
Glucose-Capillary: 104 mg/dL — ABNORMAL HIGH (ref 70–99)
Glucose-Capillary: 103 mg/dL — ABNORMAL HIGH (ref 70–99)

## 2024-11-29 LAB — PHOSPHORUS: Phosphorus: 2.7 mg/dL (ref 2.5–4.6)

## 2024-11-29 LAB — CBC
HCT: 25.5 % — ABNORMAL LOW (ref 36.0–46.0)
Hemoglobin: 8 g/dL — ABNORMAL LOW (ref 12.0–15.0)
MCH: 30.8 pg (ref 26.0–34.0)
MCHC: 31.4 g/dL (ref 30.0–36.0)
MCV: 98.1 fL (ref 80.0–100.0)
Platelets: 105 K/uL — ABNORMAL LOW (ref 150–400)
RBC: 2.6 MIL/uL — ABNORMAL LOW (ref 3.87–5.11)
RDW: 16.7 % — ABNORMAL HIGH (ref 11.5–15.5)
WBC: 16.7 K/uL — ABNORMAL HIGH (ref 4.0–10.5)
nRBC: 0.2 % (ref 0.0–0.2)

## 2024-11-29 LAB — MAGNESIUM: Magnesium: 1.7 mg/dL (ref 1.7–2.4)

## 2024-11-29 MED ORDER — GUAIFENESIN-DM 100-10 MG/5ML PO SYRP: 5.0000 mL | ORAL_SOLUTION | ORAL | Status: DC | PRN

## 2024-11-29 MED ORDER — GUAIFENESIN-DM 100-10 MG/5ML PO SYRP
5.0000 mL | ORAL_SOLUTION | ORAL | Status: DC | PRN
  Administered 2024-11-29: 5 mL
  Filled 2024-11-29: qty 10

## 2024-11-29 MED ORDER — TRAZODONE HCL 50 MG PO TABS
25.0000 mg | ORAL_TABLET | Freq: Every evening | ORAL | Status: DC | PRN
  Administered 2024-11-29: 25 mg via ORAL
  Filled 2024-11-29: qty 1

## 2024-11-29 MED ORDER — CEFUROXIME AXETIL 500 MG PO TABS
500.0000 mg | ORAL_TABLET | Freq: Two times a day (BID) | ORAL | 0 refills | Status: AC
  Filled 2024-11-29: qty 4, 2d supply, fill #0

## 2024-11-29 MED ORDER — METOPROLOL TARTRATE 50 MG PO TABS
50.0000 mg | ORAL_TABLET | Freq: Two times a day (BID) | ORAL | 0 refills | Status: AC
  Filled 2024-11-29: qty 60, 30d supply, fill #0

## 2024-11-29 MED ORDER — DILTIAZEM HCL 30 MG PO TABS
30.0000 mg | ORAL_TABLET | Freq: Four times a day (QID) | ORAL | 0 refills | Status: DC
  Filled 2024-11-29: qty 120, 30d supply, fill #0

## 2024-11-29 MED ORDER — HEPARIN SOD (PORK) LOCK FLUSH 100 UNIT/ML IV SOLN
500.0000 [IU] | INTRAVENOUS | Status: AC | PRN
  Administered 2024-11-29: 500 [IU]

## 2024-11-29 NOTE — Progress Notes (Addendum)
 Brief Nutrition Support Note  Patient has been tolerating slow increase of continuous tube feeds. Reached 33mL/hr this AM. She notes ongoing diarrhea however only 1 BM documented on 1/21 and 1/23. Plan for patient to discharge home on bolus tube feeds as she was doing PTA. Plan for plant-based formula to help with diarrhea. Able to provide patient with 2 cases of formula from the cancer center so she has formula until able to get formula provided from home infusion company.  Of note, patient had an MBS today but found to have ongoing severe dysphagia and recommended to remain NPO.  Provided patient with home tube feeding instructions. Discussed slowly increasing to goal of 5 cartons as tolerated over the next few days.   - Home TF regimen as below: 5 cartons of Compleat 1.4 daily Provides 1750 kcals, 90g protein and 970 ml H2O Flush with 60 ml free water  before and after each feeding Additional free water : 100 ml BID   Answered all questions and addressed concerns.  Patient is scheduled to follow up with a cancer center dietitian next week.  Trude Ned RD, LDN Contact via [employer].

## 2024-11-29 NOTE — Discharge Summary (Signed)
 Physician Discharge Summary  Taylor Bush FMW:[id number] DOB: [birth year] DOA: 11/25/2024  PCP: Watt Mirza, MD  Admit date: 11/25/2024 Discharge date: 11/29/2024  Admitted From: Home Disposition:  Home with home health   Recommendations for Outpatient Follow-up:  Follow up with PCP in 1 week Follow up with Oncology Dr. Autumn   Discharge Condition: Stable CODE STATUS: Full  Diet recommendation: Tube feed dependent   Brief/Interim Summary: Taylor Bush is a 79 y.o. female with medical history significant of paroxysmal afib on eliquis , CKD3, HTN, PVD, squamous of carcinoma of the tongue status post left partial glossectomy on chemo therapy and radiation with chemotherapy induced thrombocytopenia, anemia, neutropenia, G-tube dependant, sent to the ED from cancer center after she was noted to be in A Fib RVR.    She admits she has not taken her medications (including eliquis  and metoprolol ) in 3 days because she was never instructed on how to administer them through her G-tube.    In the ED, she was in afib RVR rate up to 150. BP stable. HGB 6.4, WBC 1.3. 1 unit PRBC ordered to be transfused. She was given  10mg  IV metoprolol  without significant improvement in her rate, started on diltiazem  drip and hospitalist consulted for admission.   Patient was treated with diltiazem  drip with improvement in HR. She was re-started on per tube metoprolol  and cardizem . HR remained well controlled. She was taught how to administer meds per tube. She also underwent swallow eval.   Discharge Diagnoses:   Principal Problem:   Paroxysmal atrial fibrillation (HCC) Active Problems:   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)   Pancytopenia (HCC)   A-fib (HCC)   Protein-calorie malnutrition, severe     Paroxysmal A Fib RVR - Cardizem  drip --> Cardizem  and Lopressor  by tube.  Dose decreased due to hypotension - Eliquis  - HR controlled this morning in the 70s   Malignant neoplasm  of tongue with mucositis - Status post left partial glossectomy 06/17/2024 and neck dissection 08/21/2024 - Followed by Dr. Autumn  - Currently on chemoradiation - PEG for nutrition and medication administration - Dietitian consult for TF    Left-sided pneumonia - Influenza/RSV/Covid negative  - MRSA PCR negative, discontinue Vanco - Continue cefepime  --> ceftin    Pancytopenia - In setting of chemoradiation - S/p 1 unit prbc transfusion - Filgrastim  x 2 - WBC improved    AKI on CKD stage 3b - Improved    HTN - Norvasc , lisinopril  discontinued due to hypotension    PAD  - Plavix   Discharge Instructions  Discharge Instructions     Call MD for:  difficulty breathing, headache or visual disturbances   Complete by: As directed    Call MD for:  extreme fatigue   Complete by: As directed    Call MD for:  hives   Complete by: As directed    Call MD for:  persistant dizziness or light-headedness   Complete by: As directed    Call MD for:  persistant nausea and vomiting   Complete by: As directed    Call MD for:  severe uncontrolled pain   Complete by: As directed    Call MD for:  temperature >100.4   Complete by: As directed    Discharge instructions   Complete by: As directed    You were cared for by a hospitalist during your hospital stay. If you have any questions about your discharge medications or the care you received while you were in  the hospital after you are discharged, you can call the unit and ask to speak with the hospitalist on call if the hospitalist that took care of you is not available. Once you are discharged, your primary care physician will handle any further medical issues. Please note that NO REFILLS for any discharge medications will be authorized once you are discharged, as it is imperative that you return to your primary care physician (or establish a relationship with a primary care physician if you do not have one) for your aftercare needs so that they  can reassess your need for medications and monitor your lab values.   Increase activity slowly   Complete by: As directed    No wound care   Complete by: As directed       Allergies as of 11/29/2024       Reactions   Statins Rash, Other (See Comments)   hands broke out        Medication List     STOP taking these medications    amLODipine  10 MG tablet Commonly known as: NORVASC    atorvastatin  80 MG tablet Commonly known as: LIPITOR    azithromycin  250 MG tablet Commonly known as: Zithromax  Z-Pak   lisinopril  20 MG tablet Commonly known as: ZESTRIL    metoprolol  200 MG 24 hr tablet Commonly known as: TOPROL -XL       TAKE these medications    apixaban  5 MG Tabs tablet Commonly known as: ELIQUIS  Take 1 tablet (5 mg total) by mouth 2 (two) times daily.   cefUROXime 500 MG tablet Commonly known as: CEFTIN Place 1 tablet (500 mg total) into feeding tube in the morning and at bedtime for 2 days.   clopidogrel  75 MG tablet Commonly known as: PLAVIX  Take 1 tablet (75 mg total) by mouth daily with breakfast.   dexamethasone  4 MG tablet Commonly known as: DECADRON  Take 8 mg by mouth See admin instructions. Take 8 mg by mouth once a day with food for 2 days- starting the day after chemotherapy   diltiazem  30 MG tablet Commonly known as: CARDIZEM  Place 1 tablet (30 mg total) into feeding tube every 6 (six) hours.   lidocaine  2 % solution Commonly known as: XYLOCAINE  Patient: Mix 1 part 2% viscous lidocaine , 1 part water . Swish & swallow 10mL of diluted mixture 30 minutes before meals and at bedtime, up to 4 times daily   lidocaine -prilocaine  cream Commonly known as: EMLA  Apply to affected area once What changed:  how much to take how to take this when to take this additional instructions   magic mouthwash (nystatin , lidocaine , diphenhydrAMINE , alum & mag hydroxide) suspension Take 5 mLs by mouth 4 (four) times daily as needed for mouth pain. Suspension  contains equal amounts of Maalox Extra Strength, nystatin , diphenhydramine  and lidocaine . 1:1:1:1 ratio please.   metoprolol  tartrate 50 MG tablet Commonly known as: LOPRESSOR  Place 1 tablet (50 mg total) into feeding tube 2 (two) times daily.   Nutren 1.5 Liqd Nutren 1.5 - give one 250 ml carton 5x/day via PEG. Flush tube with 60 ml water  before and after each bolus. Give additional 120 ml free water  TID to meet hydration. Provides 1875 kcal, 85 g, 955 ml water  (1915 ml total water ). 1250 ml/day meets 100% DRI   ondansetron  4 MG disintegrating tablet Commonly known as: ZOFRAN -ODT Take 1 tablet (4 mg total) by mouth every 8 (eight) hours as needed for nausea or vomiting. What changed: reasons to take this   ondansetron  8 MG  tablet Commonly known as: Zofran  Take 1 tablet (8 mg total) by mouth every 8 (eight) hours as needed for nausea or vomiting. Start on the third day after chemotherapy.   oxyCODONE  5 MG immediate release tablet Commonly known as: Oxy IR/ROXICODONE  Take 1 tablet (5 mg total) by mouth every 6 (six) hours as needed for severe pain (pain score 7-10). What changed:  when to take this additional instructions   prochlorperazine  10 MG tablet Commonly known as: COMPAZINE  Take 1 tablet (10 mg total) by mouth every 6 (six) hours as needed for nausea or vomiting.   sodium fluoride 1.1 % Gel dental gel Commonly known as: FLUORISHIELD Apply thin layer of fluoride gel inside the trays and wear them for 20 minutes nightly. Start after completion of HNRT   traMADol  50 MG tablet Commonly known as: ULTRAM  Take 1 tablet (50 mg total) by mouth every 8 (eight) hours as needed. What changed: reasons to take this   VABYSMO  IZ 1 Dose by Intravitreal route every 6 (six) weeks.        Contact information for follow-up providers     Pasam, Avinash, MD. Schedule an appointment as soon as possible for a visit in 1 week(s).   Specialty: Oncology Contact information: [address] [place] [address] [phone]         Watt Mirza, MD. Schedule an appointment as soon as possible for a visit in 1 week(s).   Specialties: Family Medicine, Sports Medicine Contact information: [address] [place] [address] [phone]              Contact information for after-discharge care     Home Medical Care     [facility] [place] [facility]) .   Service: Home Health Services Contact information: [address] [place] [address] [place]  [address] [phone]                    Allergies[1]    Procedures/Studies: CT CHEST WO CONTRAST Result Date: 11/26/2024 EXAM: CT CHEST WITHOUT CONTRAST 11/26/2024 05:11:00 PM TECHNIQUE: CT of the chest was performed without the administration of intravenous contrast. Multiplanar reformatted images are provided for review. Automated exposure control, iterative reconstruction, and/or weight based adjustment of the mA/kV was utilized to reduce the radiation dose to as low as reasonably achievable. COMPARISON: 10/11/2024 CLINICAL HISTORY: Respiratory illness, nondiagnostic xray FINDINGS: MEDIASTINUM: Diffuse coronary artery and aortic atherosclerosis. LYMPH NODES: Mildly prominent mediastinal lymph nodes are stable since the prior study and likely reactive. No axillary or hilar adenopathy. LUNGS AND PLEURA: Trace bilateral pleural effusions. Right basilar atelectasis. Somewhat more confluent airspace opacity in the left lung base concerning for pneumonia. SOFT TISSUES/BONES: No acute abnormality of the bones or soft tissues. UPPER ABDOMEN: Limited images of the upper abdomen demonstrates no acute abnormality. IMPRESSION: 1. Confluent airspace opacity in the left lung base concerning for pneumonia. 2. Trace bilateral pleural effusions. 3. Diffuse coronary artery disease, aortic atherosclerosis. Electronically signed by: Franky Crease MD 11/26/2024 05:17 PM EST RP  Workstation: [id number]   DG CHEST PORT 1 VIEW Result Date: 11/26/2024 CLINICAL DATA:  Hypoxia EXAM: PORTABLE CHEST 1 VIEW COMPARISON:  Yesterday FINDINGS: Stable cardiomegaly. Hypoinflation of the lungs. Right internal jugular Port-A-Cath is unchanged. Both lungs are clear. The visualized skeletal structures are unremarkable. IMPRESSION: Hypoinflation of the lungs. No acute abnormality seen. Electronically Signed   By: Lynwood Landy Raddle M.D.   On: 11/26/2024 15:18  DG Chest Portable 1 View Result Date: 11/25/2024 EXAM: 1 VIEW(S) XRAY OF THE CHEST 11/25/2024 01:27:00 PM COMPARISON: 10/14/2024 CLINICAL HISTORY: Cough. FINDINGS: LINES, TUBES AND DEVICES: Power injectable Right chest wall Port-A-Cath in place with tip overlying the superior caval junction. LUNGS AND PLEURA: Left upper lobe nodule with adjacent fiducial marker. Reduced density around the left upper lobe fiducial compared to previous. Linear subsegmental atelectasis or scarring at the left lung base. No pleural effusion. No pneumothorax. HEART AND MEDIASTINUM: Mild cardiomegaly. Leftward rotation observed. Aortic calcification. BONES AND SOFT TISSUES: Chronic deformity of the right posterolateral 9th rib. IMPRESSION: 1. Decreased density surrounding the left upper lobe  fiducial marker. 2. Linear subsegmental atelectasis or scarring at the left lung base. 3. Mild cardiomegaly. 4. Right chest wall port-a-cath with tip at the superior cavoatrial junction. 5. Aortic atherosclerotic calcification. 6. Chronic deformity of the right posterolateral ninth rib. Electronically signed by: Ryan Salvage MD 11/25/2024 02:14 PM EST RP Workstation: [id number]   IR Gastrostomy Tube Result Date: 11/02/2024 INDICATION: Chemotherapy with anticipated ineffective nurtritional intake. History of head and neck cancer. EXAM: PERCUTANEOUS GASTROSTOMY TUBE PLACEMENT COMPARISON:  PET-CT, 10/01/2024 MEDICATIONS: Ancef  2 gm IV; Antibiotics were administered within 1  hour of the procedure. 0.5 mg glucagon  IV. CONTRAST:  20 mL of Isovue 300 administered into the gastric lumen. ANESTHESIA/SEDATION: Moderate (conscious) sedation was employed during this procedure. A total of Versed  2 mg and Fentanyl  100 mcg was administered intravenously. Moderate Sedation Time: 14 minutes. The patient's level of consciousness and vital signs were monitored continuously by radiology nursing throughout the procedure under my direct supervision. FLUOROSCOPY: Radiation Exposure Index and estimated peak skin dose (PSD); Reference air kerma (RAK), 2 mGy. COMPLICATIONS: None immediate. PROCEDURE: Informed written consent was obtained from the patient and/or patient's representative following explanation of the procedure, risks, benefits and alternatives. A time out was performed prior to the initiation of the procedure. Maximal barrier sterile technique utilized including caps, mask, sterile gowns, sterile gloves, large sterile drape, hand hygiene and sterile prep. The LEFT costal margin and barium opacified transverse colon were identified and avoided. Air was injected into the stomach for insufflation and visualization under fluoroscopy. Under sterile conditions and local anesthesia, 2 T tacks were utilized to pexy the anterior aspect of the stomach against the ventral abdominal wall. Contrast injection confirmed appropriate positioning of each of the T tacks. An incision was made between the T tacks and a 17 gauge trocar needle was utilized to access the stomach. Needle position was confirmed within the stomach with aspiration of air and injection of a small amount of contrast. A stiff guidewire was advanced into the gastric lumen and under intermittent fluoroscopic guidance, the access needle was exchanged for a telescoping peel-away sheath, ultimately allowing placement of a 16 Fr balloon retention gastrostomy tube. The retention balloon was insufflated with a mixture of dilute saline and contrast  and pulled taut against the anterior wall of the stomach. The external disc was cinched. Contrast injection confirms positioning within the stomach. Several spot radiographic images were obtained in various obliquities for documentation. The patient tolerated procedure well without immediate post procedural complication. FINDINGS: After successful fluoroscopic guided placement, the gastrostomy tube is appropriately positioned with internal retention balloon against the ventral aspect of the gastric lumen. IMPRESSION: Successful fluoroscopic insertion of a 16 Fr balloon retention gastrostomy tube. The gastrostomy may be used immediately for medication administration and in 4 hrs for the initiation of feeds. RECOMMENDATIONS: The patient will return to Vascular Interventional  Radiology (VIR) for routine feeding tube evaluation and exchange in 6 months. Thom Hall, MD [facility] Radiology Electronically Signed   By: Thom Hall M.D.   On: 11/02/2024 09:05   IR IMAGING GUIDED PORT INSERTION Result Date: 11/02/2024 INDICATION: Chemotherapy.  History of head and neck cancer. EXAM: IMPLANTED PORT A CATH PLACEMENT WITH ULTRASOUND AND FLUOROSCOPIC GUIDANCE MEDICATIONS: None ANESTHESIA/SEDATION: Moderate (conscious) sedation was employed during this procedure. A total of Versed  2 mg and Fentanyl  100 mcg was administered intravenously. Moderate Sedation Time: 20 minutes. The patient's level of consciousness and vital signs were monitored continuously by radiology nursing throughout the procedure under my direct supervision. FLUOROSCOPY: Radiation Exposure Index and estimated peak skin dose (PSD); Reference air kerma (RAK), 0.2 mGy. COMPLICATIONS: None immediate. PROCEDURE: The procedure, risks, benefits, and alternatives were explained to the patient. Questions regarding the procedure were encouraged and answered. The patient understands and consents to the procedure. The  RIGHT neck and chest were prepped with chlorhexidine  in a sterile fashion, and a sterile drape was applied covering the operative field. Maximum barrier sterile technique with sterile gowns and gloves were used for the procedure. A timeout was performed prior to the initiation of the procedure. Local anesthesia was provided with 1% lidocaine  with epinephrine . After creating a small venotomy incision, a micropuncture kit was utilized to access the internal jugular vein under direct, real-time ultrasound guidance. Ultrasound image documentation was performed. The microwire was kinked to measure appropriate catheter length. A subcutaneous port pocket was then created along the upper chest wall utilizing a combination of sharp and blunt dissection. The pocket was irrigated with sterile saline. A single lumen power injectable port was chosen for placement. The 8 Fr catheter was tunneled from the port pocket site to the venotomy incision. The port was placed in the pocket. The external catheter was trimmed to appropriate length. At the venotomy, an 8 Fr peel-away sheath was placed over a guidewire under fluoroscopic guidance. The catheter was then placed through the sheath and the sheath was removed. Final catheter positioning was confirmed and documented with a fluoroscopic spot radiograph. The port was accessed with a Huber needle, aspirated and flushed with heparinized saline. The port pocket incision was closed with interrupted 3-0 Vicryl suture then Dermabond was applied, including at the venotomy incision. Dressings were placed. The patient tolerated the procedure well without immediate post procedural complication. IMPRESSION: Successful placement of a RIGHT internal jugular approach power injectable Port-A-Cath. The tip of the catheter is positioned within the proximal RIGHT atrium. The catheter is ready for immediate use. Thom Hall, MD [facility] Radiology  Electronically Signed   By: Thom Hall M.D.   On: 11/02/2024 09:03       Discharge Exam: Vitals:   11/29/24 0635 11/29/24 0912  BP: 123/70 (!) 135/48  Pulse:  84  Resp:    Temp:    SpO2:      General: Pt is alert, awake, not in acute distress Cardiovascular: Irreg rhythm, S1/S2 +, no edema Respiratory: CTA bilaterally, no wheezing, no rhonchi, no respiratory distress, no conversational dyspnea, on room air  Abdominal: Soft, NT, ND, bowel sounds + Extremities: no edema, no cyanosis Psych: Normal mood and affect, stable judgement and insight     The results of significant diagnostics from this hospitalization (including imaging, microbiology, ancillary and laboratory) are listed below for reference.     Microbiology: Recent Results (from the past 240 hours)  MRSA Next  Gen by PCR, Nasal     Status: None   Collection Time: 11/25/24  6:01 PM   Specimen: Nasal Mucosa; Nasal Swab  Result Value Ref Range Status   MRSA by PCR Next Gen NOT DETECTED NOT DETECTED Final    Comment: (NOTE) The GeneXpert MRSA Assay (FDA approved for NASAL specimens only), is one component of a comprehensive MRSA colonization surveillance program. It is not intended to diagnose MRSA infection nor to guide or monitor treatment for MRSA infections. Test performance is not FDA approved in patients less than 16 years old. Performed at [facility], [address], [place], [place] [address]   Resp panel by RT-PCR (RSV, Flu A&B, Covid) Anterior Nasal Swab     Status: None   Collection Time: 11/26/24  6:31 PM   Specimen: Anterior Nasal Swab  Result Value Ref Range Status   SARS Coronavirus 2 by RT PCR NEGATIVE NEGATIVE Final    Comment: (NOTE) SARS-CoV-2 target nucleic acids are NOT DETECTED.  The SARS-CoV-2 RNA is generally detectable in upper respiratory specimens during the acute phase of infection. The lowest concentration of SARS-CoV-2 viral copies this assay can detect  is 138 copies/mL. A negative result does not preclude SARS-Cov-2 infection and should not be used as the sole basis for treatment or other patient management decisions. A negative result may occur with  improper specimen collection/handling, submission of specimen other than nasopharyngeal swab, presence of viral mutation(s) within the areas targeted by this assay, and inadequate number of viral copies(<138 copies/mL). A negative result must be combined with clinical observations, patient history, and epidemiological information. The expected result is Negative.  Fact Sheet for Patients:  bloggercourse.com  Fact Sheet for Healthcare Providers:  seriousbroker.it  This test is no t yet approved or cleared by the [place]  FDA and  has been authorized for detection and/or diagnosis of SARS-CoV-2 by FDA under an Emergency Use Authorization (EUA). This EUA will remain  in effect (meaning this test can be used) for the duration of the COVID-19 declaration under Section 564(b)(1) of the Act, 21 U.S.C.section 360bbb-3(b)(1), unless the authorization is terminated  or revoked sooner.       Influenza A by PCR NEGATIVE NEGATIVE Final   Influenza B by PCR NEGATIVE NEGATIVE Final    Comment: (NOTE) The Xpert Xpress SARS-CoV-2/FLU/RSV plus assay is intended as an aid in the diagnosis of influenza from Nasopharyngeal swab specimens and should not be used as a sole basis for treatment. Nasal washings and aspirates are unacceptable for Xpert Xpress SARS-CoV-2/FLU/RSV testing.  Fact Sheet for Patients: bloggercourse.com  Fact Sheet for Healthcare Providers: seriousbroker.it  This test is not yet approved or cleared by the [place]  FDA and has been authorized for detection and/or diagnosis of SARS-CoV-2 by FDA under an Emergency Use Authorization (EUA). This EUA will remain in effect  (meaning this test can be used) for the duration of the COVID-19 declaration under Section 564(b)(1) of the Act, 21 U.S.C. section 360bbb-3(b)(1), unless the authorization is terminated or revoked.     Resp Syncytial Virus by PCR NEGATIVE NEGATIVE Final    Comment: (NOTE) Fact Sheet for Patients: bloggercourse.com  Fact Sheet for Healthcare Providers: seriousbroker.it  This test is not yet approved or cleared by the [place]  FDA and has been authorized for detection and/or diagnosis of SARS-CoV-2 by FDA under an Emergency Use Authorization (EUA). This EUA will remain in effect (meaning this test can be used) for the duration of the COVID-19  declaration under Section 564(b)(1) of the Act, 21 U.S.C. section 360bbb-3(b)(1), unless the authorization is terminated or revoked.  Performed at [facility], [address], [place], [place] [address]   Culture, blood (Routine X 2) w Reflex to ID Panel     Status: None (Preliminary result)   Collection Time: 11/26/24  7:12 PM   Specimen: BLOOD RIGHT HAND  Result Value Ref Range Status   Specimen Description   Final    BLOOD RIGHT HAND Performed at [facility], [address] [place], [place] [address]    Special Requests   Final    BOTTLES DRAWN AEROBIC ONLY Blood Culture results may not be optimal due to an inadequate volume of blood received in culture bottles Performed at [facility], [address], [place], [place] [address]    Culture   Final    NO GROWTH 3 DAYS Performed at [facility], [address] [place], [place] [address]    Report Status PENDING  Incomplete  Culture, blood (Routine X 2) w Reflex to ID Panel     Status: None (Preliminary result)   Collection Time: 11/26/24  7:17 PM   Specimen: BLOOD RIGHT HAND  Result Value Ref Range Status   Specimen Description   Final    BLOOD RIGHT HAND Performed at [facility], [address], [place], [place] [address]    Special Requests   Final    BOTTLES DRAWN AEROBIC ONLY Blood Culture results may not be optimal due to an inadequate volume of blood received in culture bottles Performed at [facility], [address], [place], [place] [address]    Culture   Final    NO GROWTH 3 DAYS Performed at [facility], [address], [place], [place] [address]    Report Status PENDING  Incomplete     Labs: BNP (last 3 results) No results for input(s): BNP in the last 8760 hours. Basic Metabolic Panel: Recent Labs  Lab 11/25/24 1324 11/26/24 0515 11/27/24 0517 11/28/24 0404 11/29/24 0629  NA 139 141 144 143 144  K 4.7 4.5 4.4 4.7 4.3  CL 106 109 111 111 111  CO2 20* 21* 22 23 24   GLUCOSE 110* 112* 104* 108* 121*  BUN 47* 36* 38* 40* 33*  CREATININE 1.78* 1.31* 1.31* 1.18* 1.07*  CALCIUM  8.9 8.9 9.4 9.3 9.3  MG  --  1.5* 1.5* 2.1 1.7  PHOS  --   --  3.6 3.4 2.7   Liver Function Tests: No results for input(s): AST, ALT, ALKPHOS, BILITOT, PROT, ALBUMIN in the last 168 hours. No results for input(s): LIPASE, AMYLASE in the last 168 hours. No results for input(s): AMMONIA in the last 168 hours. CBC: Recent Labs  Lab 11/25/24 1324 11/26/24 0515 11/27/24 0517 11/28/24 0404 11/29/24 0629  WBC 1.3* 2.1* 5.0 12.9* 16.7*  NEUTROABS 0.5*  --   --   --   --   HGB 6.4* 7.6* 7.6* 7.4* 8.0*  HCT 20.0* 22.9* 24.9* 24.0* 25.5*  MCV 96.2 95.0 100.4* 99.2 98.1  PLT 84* 82* 97* 93* 105*   Cardiac Enzymes: No results for input(s): CKTOTAL, CKMB, CKMBINDEX, TROPONINI in the last 168 hours. BNP: Invalid input(s): POCBNP CBG: Recent Labs  Lab 11/28/24 0744 11/28/24 1130 11/28/24 1640 11/28/24 2030 11/29/24 0633  GLUCAP 114* 110* 103* 113* 104*   D-Dimer No results for input(s): DDIMER in the last 72 hours.  Hgb A1c No results for input(s): HGBA1C in the last 72 hours. Lipid  Profile No results for input(s): CHOL, HDL, LDLCALC, TRIG, CHOLHDL, LDLDIRECT in the last 72 hours. Thyroid  function studies No results for input(s): TSH, T4TOTAL, T3FREE, THYROIDAB in the last 72 hours.  Invalid input(s): FREET3 Anemia work up No results for input(s): VITAMINB12, FOLATE, FERRITIN, TIBC, IRON , RETICCTPCT in the last 72 hours. Urinalysis    Component Value Date/Time   COLORURINE YELLOW 12/19/2011 1144   APPEARANCEUR CLEAR 12/19/2011 1144   LABSPEC 1.020 12/19/2011 1144   PHURINE 5.0 12/19/2011 1144   GLUCOSEU NEGATIVE 12/19/2011 1144   HGBUR NEGATIVE 12/19/2011 1144   BILIRUBINUR NEGATIVE 12/19/2011 1144   KETONESUR NEGATIVE 12/19/2011 1144   PROTEINUR NEGATIVE 12/19/2011 1144   UROBILINOGEN 0.2 12/19/2011 1144   NITRITE NEGATIVE 12/19/2011 1144   LEUKOCYTESUR SMALL (A) 12/19/2011 1144   Sepsis Labs Recent Labs  Lab 11/26/24 0515 11/27/24 0517 11/28/24 0404 11/29/24 0629  WBC 2.1* 5.0 12.9* 16.7*   Microbiology Recent Results (from the past 240 hours)  MRSA Next Gen by PCR, Nasal     Status: None   Collection Time: 11/25/24  6:01 PM   Specimen: Nasal Mucosa; Nasal Swab  Result Value Ref Range Status   MRSA by PCR Next Gen NOT DETECTED NOT DETECTED Final    Comment: (NOTE) The GeneXpert MRSA Assay (FDA approved for NASAL specimens only), is one component of a comprehensive MRSA colonization surveillance program. It is not intended to diagnose MRSA infection nor to guide or monitor treatment for MRSA infections. Test performance is not FDA approved in patients less than 36 years old. Performed at [facility], [address] [place], [place] [address]   Resp panel by RT-PCR (RSV, Flu A&B, Covid) Anterior Nasal Swab     Status: None   Collection Time: 11/26/24  6:31 PM   Specimen: Anterior Nasal Swab  Result Value Ref Range Status   SARS Coronavirus 2 by RT PCR NEGATIVE NEGATIVE Final    Comment:  (NOTE) SARS-CoV-2 target nucleic acids are NOT DETECTED.  The SARS-CoV-2 RNA is generally detectable in upper respiratory specimens during the acute phase of infection. The lowest concentration of SARS-CoV-2 viral copies this assay can detect is 138 copies/mL. A negative result does not preclude SARS-Cov-2 infection and should not be used as the sole basis for treatment or other patient management decisions. A negative result may occur with  improper specimen collection/handling, submission of specimen other than nasopharyngeal swab, presence of viral mutation(s) within the areas targeted by this assay, and inadequate number of viral copies(<138 copies/mL). A negative result must be combined with clinical observations, patient history, and epidemiological information. The expected result is Negative.  Fact Sheet for Patients:  bloggercourse.com  Fact Sheet for Healthcare Providers:  seriousbroker.it  This test is no t yet approved or cleared by the [place]  FDA and  has been authorized for detection and/or diagnosis of SARS-CoV-2 by FDA under an Emergency Use Authorization (EUA). This EUA will remain  in effect (meaning this test can be used) for the duration of the COVID-19 declaration under Section 564(b)(1) of the Act, 21 U.S.C.section 360bbb-3(b)(1), unless the authorization is terminated  or revoked sooner.       Influenza A by PCR NEGATIVE NEGATIVE Final   Influenza B by PCR NEGATIVE NEGATIVE Final    Comment: (NOTE) The Xpert Xpress SARS-CoV-2/FLU/RSV plus assay is intended as an aid in the diagnosis of influenza from Nasopharyngeal swab specimens  and should not be used as a sole basis for treatment. Nasal washings and aspirates are unacceptable for Xpert Xpress SARS-CoV-2/FLU/RSV testing.  Fact Sheet for Patients: bloggercourse.com  Fact Sheet for Healthcare  Providers: seriousbroker.it  This test is not yet approved or cleared by the [place]  FDA and has been authorized for detection and/or diagnosis of SARS-CoV-2 by FDA under an Emergency Use Authorization (EUA). This EUA will remain in effect (meaning this test can be used) for the duration of the COVID-19 declaration under Section 564(b)(1) of the Act, 21 U.S.C. section 360bbb-3(b)(1), unless the authorization is terminated or revoked.     Resp Syncytial Virus by PCR NEGATIVE NEGATIVE Final    Comment: (NOTE) Fact Sheet for Patients: bloggercourse.com  Fact Sheet for Healthcare Providers: seriousbroker.it  This test is not yet approved or cleared by the [place]  FDA and has been authorized for detection and/or diagnosis of SARS-CoV-2 by FDA under an Emergency Use Authorization (EUA). This EUA will remain in effect (meaning this test can be used) for the duration of the COVID-19 declaration under Section 564(b)(1) of the Act, 21 U.S.C. section 360bbb-3(b)(1), unless the authorization is terminated or revoked.  Performed at [facility], [address] [place], [place] [address]   Culture, blood (Routine X 2) w Reflex to ID Panel     Status: None (Preliminary result)   Collection Time: 11/26/24  7:12 PM   Specimen: BLOOD RIGHT HAND  Result Value Ref Range Status   Specimen Description   Final    BLOOD RIGHT HAND Performed at [facility], [address], [place], [place] [address]    Special Requests   Final    BOTTLES DRAWN AEROBIC ONLY Blood Culture results may not be optimal due to an inadequate volume of blood received in culture bottles Performed at [facility], [address] [place], [place] [address]    Culture   Final    NO GROWTH 3 DAYS Performed at [facility], [address] [place], [place] [address]    Report Status  PENDING  Incomplete  Culture, blood (Routine X 2) w Reflex to ID Panel     Status: None (Preliminary result)   Collection Time: 11/26/24  7:17 PM   Specimen: BLOOD RIGHT HAND  Result Value Ref Range Status   Specimen Description   Final    BLOOD RIGHT HAND Performed at [facility], [address], [place], [place] [address]    Special Requests   Final    BOTTLES DRAWN AEROBIC ONLY Blood Culture results may not be optimal due to an inadequate volume of blood received in culture bottles Performed at [facility], [address], [place], [place] [address]    Culture   Final    NO GROWTH 3 DAYS Performed at [facility], [address] [place], [place] [address]    Report Status PENDING  Incomplete     Patient was seen and examined on the day of discharge and was found to be in stable condition. Time coordinating discharge: 35 minutes including assessment and coordination of care, as well as examination of the patient.   SIGNED:  Delon Hoe, DO [facility] 11/29/2024, 9:30 AM       [1]  Allergies Allergen Reactions   Statins Rash and Other (See Comments)    hands broke out

## 2024-11-29 NOTE — Plan of Care (Signed)

## 2024-11-29 NOTE — Telephone Encounter (Signed)
 I left a voicemail for patient regarding 12/03/2024 and 12/06/2024 appointments.

## 2024-11-29 NOTE — Progress Notes (Signed)
 Discharge medications delivered to patient at the bedside in a secure bag.

## 2024-11-29 NOTE — TOC Transition Note (Signed)
 Transition of Care [facility]) - Discharge Note   Patient Details  Name: Taylor Bush MRN: [id number] Date of Birth: 1945/12/17  [facility]) CM/SW Contact:  Jon ONEIDA Anon, RN Phone Number: 11/29/2024, 10:59 AM   Clinical Narrative:    Pt will discharge home with home health services through [facility]. HH PT/RN orders are in place. Pt agreeable to the discharge plan. Pt tube feed supplies provided through the Cancer Center per pt. Pt family will provide transportation at discharge. No further ICM needs identified at this time. ICM will sign off.     Final next level of care: Home w Home Health Services Barriers to Discharge: Barriers Resolved   Patient Goals and CMS Choice Patient states their goals for this hospitalization and ongoing recovery are:: Return home with [facility] services through [facility] CMS Medicare.gov Compare Post Acute Care list provided to:: Patient Choice offered to / list presented to : Patient [place] ownership interest in [facility].provided to:: Patient    Discharge Placement                  Name of family member notified: Waylan Perry Daughter, Emergency Contact  [phone] Patient and family notified of of transfer: 11/29/24  Discharge Plan and Services Additional resources added to the After Visit Summary for   In-house Referral: NA Discharge Planning Services: CM Consult Post Acute Care Choice: Durable Medical Equipment, Home Health          DME Arranged: N/A DME Agency: NA       HH Arranged: RN, PT HH Agency: [facility] Date HH Agency Contacted: 11/28/24 Time HH Agency Contacted: [phone] Representative spoke with at [facility] Agency: [place] with [facility]  Social Drivers of Health (SDOH) Interventions SDOH Screenings   Food Insecurity: No Food Insecurity (11/26/2024)  Housing: Low Risk (11/26/2024)  Transportation Needs: No Transportation Needs (11/26/2024)  Utilities: Not At Risk  (11/26/2024)  Alcohol  Screen: Low Risk (04/29/2024)  Depression (PHQ2-9): Low Risk (11/22/2024)  Financial Resource Strain: Low Risk (04/29/2024)  Physical Activity: Insufficiently Active (04/29/2024)  Social Connections: Moderately Isolated (11/26/2024)  Stress: No Stress Concern Present (04/29/2024)  Tobacco Use: Medium Risk (11/22/2024)  Health Literacy: Adequate Health Literacy (04/29/2024)     Readmission Risk Interventions    11/27/2024    4:14 PM  Readmission Risk Prevention Plan  Transportation Screening Complete  Medication Review (RN Care Manager) Complete  PCP or Specialist appointment within 3-5 days of discharge Complete  HRI or Home Care Consult Complete  SW Recovery Care/Counseling Consult Complete  Palliative Care Screening Not Applicable  Skilled Nursing Facility Complete

## 2024-11-29 NOTE — Progress Notes (Signed)
 This RN provided discharge teaching to both the patient and her daughter, Marval Haddock. Within this discharge teaching this RN informed pt and pt's daughter that her Cardizem  PO medication would be ready at 1430 at the community pharmacy and they agreed to come back at or after that time for pick up. Pt was then transported by this RN for discharge and was in no apparent distress. Breathing was regular, unlabored, and symmetrical.

## 2024-11-29 NOTE — Progress Notes (Signed)
 Modified Barium Swallow Study  Patient Details  Name: Taylor Bush MRN: [id number] Date of Birth: 1946/04/03  Today's Date: 11/29/2024  Modified Barium Swallow completed.  Full report located under Chart Review in the Imaging Section.  History of Present Illness 79 yo female presenting to ED 1/19 from cancer center after she was noted to be in A-fib with RVR. CT Chest shows confluent airspace opacity in the L lung base concerning for pneumonia with trace bilateral pleural effusions. 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 S/p partial glossectomy 06/17/24. PEG placed 11/01/24. Established with OP SLP 11/21/24 and provided HEP. Other notable PMH includes paroxysmal A-fib on Eliquis , CKD 3, HTN, PVD, chemotherapy induced thrombocytopenia, anemia, neutropenia   Clinical Impression Pt exhibits severe oropharyngeal dysphagia characterized by reduced BOT retraction, absent epitglottic inversion, and minimal laryngeal elevation. This contributes to residue, primarily collecting in the valleculae but also noted along the BOT, pharyngeal wall, and pyriform sinuses. Liquid residuals consistently progress over the epiglottis and into the laryngeal vestibule. Penetration occurs more readily during the swallow with thin liquids and as the volume resting on the vocal folds continues to increase, results in aspiration (PAS 7). Function did not improve with a chin tuck or bilateral head turns. Nectar and honey thick liquids consistently reach the vocal folds and can intermittently be cleared with significant effort to use throat clearance and multiple swallows but it is typically cyclical (PAS 5). Purees significantly increase the volume of residue in the valleculae but do not cause penetration/aspiration. Mucositis and odynophagia have significantly limited pt's oral intake since starting XRT 11/04/24. Encouraged pt to continue trying to swallow to maintain function but doing so primarily with  ice chips and water  or purees. Use oral care frequently before POs. Ongoing SLP f/u on an OP basis to reinforce use of HEP and determine timing/necessity of repeat instrumental testing is recommended.   DIGEST Swallow Severity Rating*  Safety: 3  Efficiency: 3  Overall Pharyngeal Swallow Severity: 3 (severe) 1: mild; 2: moderate; 3: severe; 4: profound  *The Dynamic Imaging Grade of Swallowing Toxicity is standardized for the head and neck cancer population, however, demonstrates promising clinical applications across populations to standardize the clinical rating of pharyngeal swallow safety and severity.  Factors that may increase risk of adverse event in presence of aspiration Noe & Lianne 2021): Poor general health and/or compromised immunity;Frail or deconditioned;Inadequate oral hygiene;Reduced saliva  Swallow Evaluation Recommendations Recommendations: NPO;Free water  protocol after oral care Medication Administration: Via alternative means Oral care recommendations: Oral care QID (4x/day);Oral care before PO    Damien Blumenthal, M.A., CCC-SLP Speech Language Pathology, Acute Rehabilitation Services  Secure Chat preferred [phone]  11/29/2024,10:39 AM

## 2024-11-29 NOTE — Addendum Note (Signed)
 Encounter addended by: Janice Lynwood BROCKS on: 11/29/2024 8:56 AM  Actions taken: Imaging Exam ended

## 2024-12-01 LAB — CULTURE, BLOOD (ROUTINE X 2)
Culture: NO GROWTH
Culture: NO GROWTH

## 2024-12-02 ENCOUNTER — Ambulatory Visit

## 2024-12-02 ENCOUNTER — Ambulatory Visit (HOSPITAL_COMMUNITY)

## 2024-12-02 ENCOUNTER — Telehealth: Payer: Self-pay | Admitting: *Deleted

## 2024-12-02 NOTE — Transitions of Care (Post Inpatient/ED Visit) (Signed)
 "  12/02/2024  Name: Taylor Bush MRN: [id number] DOB: 1946/09/06  Today's TOC FU Call Status: Today's TOC FU Call Status:: Successful TOC FU Call Completed TOC FU Call Complete Date: 12/02/24  Patient's Name and Date of Birth confirmed. Name, DOB (Daughter Marval present and patient provider permission to speak with her daughter concerning today's assessment and inquries)  Transition Care Management Follow-up Telephone Call Date of Discharge: 11/29/24 Discharge Facility: [facility]) Type of Discharge: Inpatient Admission Primary Inpatient Discharge Diagnosis:: ESRD How have you been since you were released from the hospital?: Better Any questions or concerns?: No  Items Reviewed: Did you receive and understand the discharge instructions provided?: Yes Medications obtained,verified, and reconciled?: Yes (Medications Reviewed) Any new allergies since your discharge?: No Dietary orders reviewed?: Yes Type of Diet Ordered:: tube feeds with oral liquids on some medications Do you have support at home?: Yes People in Home [RPT]: child(ren), adult Name of Support/Comfort Primary Source: Daughter Marval and son Oneil  Medications Reviewed Today: Medications Reviewed Today     Reviewed by Alvia Olam BIRCH, RN (Registered Nurse) on 12/02/24 at 1312  Med List Status: <None>   Medication Order Taking? Sig Documenting Provider Last Dose Status Informant  apixaban  (ELIQUIS ) 5 MG TABS tablet [id number] Yes Take 1 tablet (5 mg total) by mouth 2 (two) times daily. Henry Manuelita NOVAK, NP  Active Self  cefUROXime  (CEFTIN ) 500 MG tablet [id number] Yes Take 500 mg by mouth 2 (two) times daily with a meal. [provider]  Active   clopidogrel  (PLAVIX ) 75 MG tablet [id number] Yes Take 1 tablet (75 mg total) by mouth daily with breakfast. Cherlyn Labella, MD  Active Self  dexamethasone  (DECADRON ) 4 MG tablet [id number] Yes Take 8 mg by mouth See admin instructions. Take 8 mg by mouth once a day  with food for 2 days- starting the day after chemotherapy [provider]  Active Self  diltiazem  (CARDIZEM ) 30 MG tablet [id number] Yes Place 1 tablet (30 mg total) into feeding tube every 6 (six) hours. Rojelio Nest, DO  Active   Faricimab -svoa (VABYSMO  IZ) [id number] Yes 1 Dose by Intravitreal route every 6 (six) weeks. [provider]  Active Self           Med Note CHRISTIE ALEXANDER   Thu Jun 13, 2024  1:59 PM) Given at MD office  lidocaine  (XYLOCAINE ) 2 % solution [id number]  Patient: Mix 1 part 2% viscous lidocaine , 1 part water . Swish & swallow 10mL of diluted mixture 30 minutes before meals and at bedtime, up to 4 times daily  Patient not taking: Reported on 12/02/2024   Izell Domino, MD  Active Self  lidocaine -prilocaine  (EMLA ) cream [id number] Yes Apply to affected area once Pasam, Chinita, MD  Active Self           Med Note MARISA, NATHANEL LOISE Kitchens Nov 25, 2024  3:10 PM)    magic mouthwash (nystatin , lidocaine , diphenhydrAMINE , alum & mag hydroxide) suspension [id number] Yes Take 5 mLs by mouth 4 (four) times daily as needed for mouth pain. Suspension contains equal amounts of Maalox Extra Strength, nystatin , diphenhydramine  and lidocaine . 1:1:1:1 ratio please. Pasam, Chinita, MD  Active Self  metoprolol  tartrate (LOPRESSOR ) 50 MG tablet [id number] Yes Place 1 tablet (50 mg total) into feeding tube 2 (two) times daily. Rojelio Nest, DO  Active   Nutritional Supplements (NUTREN 1.5) BERNICE [id number] Yes Nutren 1.5 - give one 250 ml carton 5x/day via PEG. Flush tube with 60 ml  water  before and after each bolus. Give additional 120 ml free water  TID to meet hydration. Provides 1875 kcal, 85 g, 955 ml water  (1915 ml total water ). 1250 ml/day meets 100% DRI Pasam, Avinash, MD  Active Self  ondansetron  (ZOFRAN ) 8 MG tablet [id number] Yes Take 1 tablet (8 mg total) by mouth every 8 (eight) hours as needed for nausea or vomiting. Start on the third day after chemotherapy. Autumn Millman,  MD  Active Self           Med Note MARISA, NATHANEL LOISE Kitchens Nov 25, 2024  3:10 PM)    ondansetron  (ZOFRAN -ODT) 4 MG disintegrating tablet [id number] Yes Take 1 tablet (4 mg total) by mouth every 8 (eight) hours as needed for nausea or vomiting.  Patient taking differently: Take 4 mg by mouth every 8 (eight) hours as needed for nausea or vomiting (if not taking the 8 mg strength- dissolve orally).   Jesus Oliphant, MD  Active Self           Med Note MARISA, NATHANEL LOISE Kitchens Nov 25, 2024  3:10 PM)    oxyCODONE  (OXY IR/ROXICODONE ) 5 MG immediate release tablet [id number] Yes Take 1 tablet (5 mg total) by mouth every 6 (six) hours as needed for severe pain (pain score 7-10).  Patient taking differently: Take 5 mg by mouth See admin instructions. Take 5 mg by mouth two times a day and an additional 5 mg twice a day as needed for severe pain   Pasam, Avinash, MD  Active Self  prochlorperazine  (COMPAZINE ) 10 MG tablet [id number] Yes Take 1 tablet (10 mg total) by mouth every 6 (six) hours as needed for nausea or vomiting. Autumn Millman, MD  Active Self           Med Note MARISA, NATHANEL LOISE Kitchens Nov 25, 2024  3:10 PM)    sodium fluoride (FLUORISHIELD) 1.1 % GEL dental gel [id number] Yes Apply thin layer of fluoride gel inside the trays and wear them for 20 minutes nightly. Start after completion of HNRT [provider]  Active Self           Med Note MARISA, NATHANEL LOISE Kitchens Nov 25, 2024  3:10 PM)    traMADol  (ULTRAM ) 50 MG tablet [id number] Yes Take 1 tablet (50 mg total) by mouth every 8 (eight) hours as needed.  Patient taking differently: Take 50 mg by mouth every 8 (eight) hours as needed (for pain).   Watt Mirza, MD  Active Self            Home Care and Equipment/Supplies: Were Home Health Services Ordered?: Yes Name of Home Health Agency:: [facility] [phone] Has Agency set up a time to come to your home?: Yes First Home Health Visit Date: 12/05/24 Any new equipment or medical supplies  ordered?: No  Functional Questionnaire: Do you need assistance with bathing/showering or dressing?: Yes (Family assisting with this task) Do you need assistance with meal preparation?: Yes (Family assisting with this task) Do you need assistance with eating?: Yes (Tube feeds and Family assisting with this task) Do you have difficulty maintaining continence: No Do you need assistance with getting out of bed/getting out of a chair/moving?: No Do you have difficulty managing or taking your medications?: Yes (Family assisting with this task)  Follow up appointments reviewed: PCP Follow-up appointment confirmed?: Yes Date of PCP follow-up appointment?: 12/04/24 Follow-up Provider: Dr. Mirza [facility] Follow-up appointment confirmed?: No Reason  Specialist Follow-Up Not Confirmed: Patient has Specialist Provider Number and will Call for Appointment (Advise patient/daughter to contact her provider for an appointment) Do you need transportation to your follow-up appointment?: No Do you understand care options if your condition(s) worsen?: Yes-patient verbalized understanding  SDOH Interventions Today    Flowsheet Row Most Recent Value  SDOH Interventions   Food Insecurity Interventions Intervention Not Indicated  Housing Interventions Intervention Not Indicated  Transportation Interventions Intervention Not Indicated  Utilities Interventions Intervention Not Indicated   Discussed and offered 30 day TOC program.  Patient declined the [facility] program and services at this time .  The patient has been provided with contact information for the care management team and has been advised to call with any health -related questions or concerns.  The patient verbalized understanding with current plan of care.  The patient is directed to their insurance card regarding availability of benefits coverage.    Olam Ku, RN, BSN [place]  [facility], [facility] RN  Care Manager Direct Dial: [phone]  Fax: [phone]   "

## 2024-12-02 NOTE — Patient Instructions (Signed)
 Visit Information  Thank you for taking time to visit with me today. Please contact her primary care provider with any additional questions or inquires .  Patient Instructions: -Take all medications as prescribed  -Do daily weights -Watch for swelling or shortness of breath -Verified patient has provider contact numbers if any atrial fibrillation symptoms are encountered -Report any bleeding or signs of infection -Keep all follow-up appointments -Call your primary provider's care team if you feel worse -Watch for swelling to all extremities and contact provider with any changes or symptoms that worsens. -Encouraged pt to use her walker and assistive devices at all times to avoid the risk of falls   Care plan and visit instructions communicated with the patient verbally today. Patient agrees to receive a copy in MyChart. Active MyChart status and patient understanding of how to access instructions and care plan via MyChart confirmed with patient.     No further follow up required: Patient has declined the [facility] program and services at this time.  Please call the care guide team at [phone] if you need to cancel or reschedule your appointment.   Please call the Suicide and Crisis Lifeline: 988 call the [place]  National Suicide Prevention Lifeline: 403-128-8374 or TTY: 873-850-0821 TTY 802 076 6919) to talk to a trained counselor call 1-800-273-TALK (toll free, 24 hour hotline) if you are experiencing a Mental Health or Behavioral Health Crisis or need someone to talk to.  Olam Ku, RN, BSN [place]  [facility], [facility] RN Care Manager Direct Dial: [phone]  Fax: [phone]

## 2024-12-03 ENCOUNTER — Ambulatory Visit

## 2024-12-03 ENCOUNTER — Inpatient Hospital Stay

## 2024-12-03 ENCOUNTER — Telehealth: Payer: Self-pay

## 2024-12-03 NOTE — Telephone Encounter (Signed)
 Spoke with pt via telephone regarding appts today.  Pt stated she's unable to come d/t inclement weather.  Pt requested if the appts could be rescheduled.  Informed pt that someone from the [facility] Scheduling Team will contact the pt to get her rescheduled.

## 2024-12-03 NOTE — Progress Notes (Unsigned)
 "    Taylor Bush [name], MD, CAQ Sports Medicine [facility] at [facility] [address] [place], [address]  Phone: [phone]  FAX: [phone]  Taylor Bush - 79 y.o. female  MRN [id number]  Date of Birth: [birth year]  Date: 12/04/2024  PCP: Taylor Mirza, MD  Referral: Taylor Mirza, MD  No chief complaint on file.  Subjective:   Taylor Bush is a 79 y.o. very pleasant female patient with There is no height or weight on file to calculate BMI. who presents with the following:  Discussed the use of AI scribe software for clinical note transcription with the patient, who gave verbal consent to proceed.  Admit date: 11/25/2024 Discharge date: 11/29/2024  Status post squamous cell carcinoma of the tongue, status post left partial glossectomy on chemotherapy and radiation. -Presented with chemotherapy-induced thrombocytopenia, anemia, neutropenia and sent to the ER with A-fib with rapid ventricular response. -She is currently G-tube dependent.  She was found to have A-fib with RVR, rate at 150 in the ER. Hemoglobin 6.4 White blood cells 1.3 Transfuse 1 unit of packed red blood cells She was placed on a diltiazem  drip in the ER  While in the hospital, she was restarted on NG tube metoprolol  and Cardizem . History of Present Illness     Review of Systems is noted in the HPI, as appropriate  Objective:   There were no vitals taken for this visit.  GEN: No acute distress; alert,appropriate. PULM: Breathing comfortably in no respiratory distress PSYCH: Normally interactive.   Laboratory and Imaging Data:  Assessment and Plan:   No diagnosis found. Assessment & Plan   Medication Management during today's office visit: No orders of the defined types were placed in this encounter.  There are no discontinued medications.  Orders placed today for conditions managed today: No orders of the defined types were placed in this  encounter.   Disposition: No follow-ups on file.  [employer] speech-to-text software was used for transcription in this dictation.  Possible transcriptional errors can occur using Animal nutritionist.   Signed,  Taylor Bush. Sullivan Blasing, MD   Outpatient Encounter Medications as of 12/04/2024  Medication Sig   apixaban  (ELIQUIS ) 5 MG TABS tablet Take 1 tablet (5 mg total) by mouth 2 (two) times daily.   cefUROXime  (CEFTIN ) 500 MG tablet Take 500 mg by mouth 2 (two) times daily with a meal.   clopidogrel  (PLAVIX ) 75 MG tablet Take 1 tablet (75 mg total) by mouth daily with breakfast.   dexamethasone  (DECADRON ) 4 MG tablet Take 8 mg by mouth See admin instructions. Take 8 mg by mouth once a day with food for 2 days- starting the day after chemotherapy (per patient/daughter reports taking all medicine by feeding tube)   diltiazem  (CARDIZEM ) 30 MG tablet Place 1 tablet (30 mg total) into feeding tube every 6 (six) hours.   Faricimab -svoa (VABYSMO  IZ) 1 Dose by Intravitreal route every 6 (six) weeks.   lidocaine  (XYLOCAINE ) 2 % solution Patient: Mix 1 part 2% viscous lidocaine , 1 part water . Swish & swallow 10mL of diluted mixture 30 minutes before meals and at bedtime, up to 4 times daily (Patient not taking: Reported on 12/02/2024)   lidocaine -prilocaine  (EMLA ) cream Apply to affected area once   magic mouthwash (nystatin , lidocaine , diphenhydrAMINE , alum & mag hydroxide) suspension Take 5 mLs by mouth 4 (four) times daily as needed for mouth pain. Suspension contains equal amounts of Maalox Extra Strength, nystatin , diphenhydramine  and lidocaine . 1:1:1:1 ratio  please.   metoprolol  tartrate (LOPRESSOR ) 50 MG tablet Place 1 tablet (50 mg total) into feeding tube 2 (two) times daily.   Nutritional Supplements (NUTREN 1.5) LIQD Nutren 1.5 - give one 250 ml carton 5x/day via PEG. Flush tube with 60 ml water  before and after each bolus. Give additional 120 ml free water  TID to meet hydration. Provides  1875 kcal, 85 g, 955 ml water  (1915 ml total water ). 1250 ml/day meets 100% DRI   ondansetron  (ZOFRAN ) 8 MG tablet Take 1 tablet (8 mg total) by mouth every 8 (eight) hours as needed for nausea or vomiting. Start on the third day after chemotherapy. (Patient taking differently: Take 8 mg by mouth every 8 (eight) hours as needed for nausea or vomiting. Start on the third day after chemotherapy. Per patient/daughter taking through feeding tube.)   ondansetron  (ZOFRAN -ODT) 4 MG disintegrating tablet Take 1 tablet (4 mg total) by mouth every 8 (eight) hours as needed for nausea or vomiting. (Patient taking differently: Take 4 mg by mouth every 8 (eight) hours as needed for nausea or vomiting (if not taking the 8 mg strength- dissolve orally).)   oxyCODONE  (OXY IR/ROXICODONE ) 5 MG immediate release tablet Take 1 tablet (5 mg total) by mouth every 6 (six) hours as needed for severe pain (pain score 7-10). (Patient taking differently: Take 5 mg by mouth See admin instructions. Take 5 mg by mouth two times a day and an additional 5 mg twice a day as needed for severe pain. Reports per patient/daughter taking all medicine by feeding tube.)   prochlorperazine  (COMPAZINE ) 10 MG tablet Take 1 tablet (10 mg total) by mouth every 6 (six) hours as needed for nausea or vomiting. (Patient taking differently: Take 10 mg by mouth every 6 (six) hours as needed for nausea or vomiting. (per patient/daughter reports taking all medicines by feeding tube))   sodium fluoride (FLUORISHIELD) 1.1 % GEL dental gel Apply thin layer of fluoride gel inside the trays and wear them for 20 minutes nightly. Start after completion of HNRT   traMADol  (ULTRAM ) 50 MG tablet Take 1 tablet (50 mg total) by mouth every 8 (eight) hours as needed. (Patient taking differently: Take 50 mg by mouth every 8 (eight) hours as needed (for pain). (per patient/daughter reports taking all medicines given by feeding tube))   No facility-administered encounter  medications on file as of 12/04/2024.   "

## 2024-12-03 NOTE — Progress Notes (Signed)
 Oncology Nurse Navigator Documentation    I called Ms. Hodgman today and spoke with both her and her daughter over the phone. She is experiencing side effects related to her chemo/radiation treatment for her head and neck cancer. She is considering stopping treatment at this time. I did attempt to encourage her to continue treatment as this would be her best chance to cure her cancer. I advised that she could continue radiation and discontinue chemotherapy as well. She plans to discuss this with her PCP and also Dr. Autumn on Friday 1/30 at her scheduled appointment. I am working on getting her to see Dr. Izell on Friday as well to discuss her side effects and how to manage them during treatment.   Delon Jefferson RN, BSN, OCN Head & Neck Oncology Nurse Navigator [facility] at [facility] Phone # [phone]  Fax # [phone]

## 2024-12-04 ENCOUNTER — Ambulatory Visit: Admitting: Family Medicine

## 2024-12-04 ENCOUNTER — Encounter: Payer: Self-pay | Admitting: Family Medicine

## 2024-12-04 ENCOUNTER — Ambulatory Visit

## 2024-12-04 ENCOUNTER — Encounter: Payer: Self-pay | Admitting: Oncology

## 2024-12-04 VITALS — BP 90/62 | HR 87 | Temp 98.4°F | Ht 60.0 in | Wt 127.1 lb

## 2024-12-04 DIAGNOSIS — D638 Anemia in other chronic diseases classified elsewhere: Secondary | ICD-10-CM

## 2024-12-04 DIAGNOSIS — D61818 Other pancytopenia: Secondary | ICD-10-CM

## 2024-12-04 DIAGNOSIS — N1832 Chronic kidney disease, stage 3b: Secondary | ICD-10-CM

## 2024-12-04 DIAGNOSIS — C023 Malignant neoplasm of anterior two-thirds of tongue, part unspecified: Secondary | ICD-10-CM

## 2024-12-04 DIAGNOSIS — I4891 Unspecified atrial fibrillation: Secondary | ICD-10-CM

## 2024-12-04 DIAGNOSIS — D701 Agranulocytosis secondary to cancer chemotherapy: Secondary | ICD-10-CM

## 2024-12-04 DIAGNOSIS — E43 Unspecified severe protein-calorie malnutrition: Secondary | ICD-10-CM

## 2024-12-04 LAB — CBC WITH DIFFERENTIAL/PLATELET
Basophils Absolute: 0 10*3/uL (ref 0.0–0.1)
Basophils Relative: 0.4 % (ref 0.0–3.0)
Eosinophils Absolute: 0 10*3/uL (ref 0.0–0.7)
Eosinophils Relative: 0 % (ref 0.0–5.0)
HCT: 26.2 % — ABNORMAL LOW (ref 36.0–46.0)
Hemoglobin: 8.6 g/dL — ABNORMAL LOW (ref 12.0–15.0)
Lymphocytes Relative: 8.5 % — ABNORMAL LOW (ref 12.0–46.0)
Lymphs Abs: 0.7 10*3/uL (ref 0.7–4.0)
MCHC: 32.7 g/dL (ref 30.0–36.0)
MCV: 95.4 fl (ref 78.0–100.0)
Monocytes Absolute: 0.8 10*3/uL (ref 0.1–1.0)
Monocytes Relative: 8.9 % (ref 3.0–12.0)
Neutro Abs: 6.9 10*3/uL (ref 1.4–7.7)
Neutrophils Relative %: 82.2 % — ABNORMAL HIGH (ref 43.0–77.0)
Platelets: 200 10*3/uL (ref 150.0–400.0)
RBC: 2.74 Mil/uL — ABNORMAL LOW (ref 3.87–5.11)
RDW: 17.5 % — ABNORMAL HIGH (ref 11.5–15.5)
WBC: 8.5 10*3/uL (ref 4.0–10.5)

## 2024-12-04 LAB — HEPATIC FUNCTION PANEL
ALT: 18 U/L (ref 3–35)
AST: 16 U/L (ref 5–37)
Albumin: 3.5 g/dL (ref 3.5–5.2)
Alkaline Phosphatase: 92 U/L (ref 39–117)
Bilirubin, Direct: 0.2 mg/dL (ref 0.1–0.3)
Total Bilirubin: 0.5 mg/dL (ref 0.2–1.2)
Total Protein: 6.6 g/dL (ref 6.0–8.3)

## 2024-12-04 LAB — BASIC METABOLIC PANEL WITH GFR
BUN: 43 mg/dL — ABNORMAL HIGH (ref 6–23)
CO2: 30 meq/L (ref 19–32)
Calcium: 8.6 mg/dL (ref 8.4–10.5)
Chloride: 99 meq/L (ref 96–112)
Creatinine, Ser: 1.8 mg/dL — ABNORMAL HIGH (ref 0.40–1.20)
GFR: 26.68 mL/min — ABNORMAL LOW
Glucose, Bld: 125 mg/dL — ABNORMAL HIGH (ref 70–99)
Potassium: 4.6 meq/L (ref 3.5–5.1)
Sodium: 138 meq/L (ref 135–145)

## 2024-12-04 MED ORDER — PROMETHAZINE-DM 6.25-15 MG/5ML PO SYRP: 2.5000 mL | ORAL_SOLUTION | Freq: Four times a day (QID) | ORAL | 0 refills | Status: AC | PRN

## 2024-12-04 MED ORDER — CEPHALEXIN 500 MG PO CAPS: 500.0000 mg | ORAL_CAPSULE | Freq: Three times a day (TID) | ORAL | 0 refills | Status: AC

## 2024-12-05 ENCOUNTER — Ambulatory Visit

## 2024-12-05 ENCOUNTER — Telehealth: Payer: Self-pay | Admitting: *Deleted

## 2024-12-05 ENCOUNTER — Telehealth: Payer: Self-pay

## 2024-12-05 ENCOUNTER — Telehealth: Payer: Self-pay | Admitting: Cardiology

## 2024-12-05 NOTE — Telephone Encounter (Signed)
 Copied from CRM #[id number]. Topic: Clinical - Home Health Verbal Orders >> Dec 05, 2024  3:09 PM Zebedee SAUNDERS wrote: Caller/Agency: [facility] health per Sari Rushing Number: [phone] Service Requested: PT Frequency: 1xweek 8 weeks Any new concerns about the patient? No

## 2024-12-05 NOTE — Telephone Encounter (Signed)
 Pt c/o BP issue: STAT if pt c/o blurred vision, one-sided weakness or slurred speech.  STAT if BP is GREATER than 180/120 TODAY.  STAT if BP is LESS than 90/60 and SYMPTOMATIC TODAY  1. What is your BP concern?  Aimee, PT called in concerned that pt's BP was low today.   2. Have you taken any BP medication today? Yes, she confirmed pt taking diltiazem  in feeding tube 2x daily and metoprolol  2x daily.   3. What are your last 5 BP readings? 102/60   4. Are you having any other symptoms (ex. Dizziness, headache, blurred vision, passed out)?  Dizziness, also mentioned pt not drinking that much water , she states she did educate her about drinking more.

## 2024-12-05 NOTE — Telephone Encounter (Signed)
 Left voicemail giving verbal order for PT 1 x week for 8 weeks.

## 2024-12-05 NOTE — Telephone Encounter (Signed)
 Left a message to call back.

## 2024-12-05 NOTE — Telephone Encounter (Signed)
 Copied from CRM [id number]. Topic: Clinical - Prescription Issue >> Dec 05, 2024  4:36 PM Nessti S wrote: Reason for CRM: called because pcp prescribed antibiotic cephALEXin  (KEFLEX ) 500 MG capsule which was filled in capsule form and pt cant have anything by mouth because of the pegtube (g tube). She wanted to know if pcp was aware and also can he prescribe a liquid form. She would like a call back soon as possible

## 2024-12-06 ENCOUNTER — Ambulatory Visit
Admission: RE | Admit: 2024-12-06 | Discharge: 2024-12-06 | Disposition: A | Source: Ambulatory Visit | Attending: Radiation Oncology

## 2024-12-06 ENCOUNTER — Inpatient Hospital Stay: Admitting: Oncology

## 2024-12-06 ENCOUNTER — Other Ambulatory Visit: Payer: Self-pay

## 2024-12-06 ENCOUNTER — Telehealth: Payer: Self-pay | Admitting: Family Medicine

## 2024-12-06 ENCOUNTER — Inpatient Hospital Stay: Admitting: Dietician

## 2024-12-06 ENCOUNTER — Inpatient Hospital Stay

## 2024-12-06 VITALS — BP 126/64 | HR 73 | Temp 97.5°F | Resp 18 | Ht 60.0 in | Wt 126.5 lb

## 2024-12-06 DIAGNOSIS — C023 Malignant neoplasm of anterior two-thirds of tongue, part unspecified: Secondary | ICD-10-CM | POA: Diagnosis not present

## 2024-12-06 LAB — CMP (CANCER CENTER ONLY)
ALT: 21 U/L (ref 0–44)
AST: 22 U/L (ref 15–41)
Albumin: 3.6 g/dL (ref 3.5–5.0)
Alkaline Phosphatase: 100 U/L (ref 38–126)
Anion gap: 11 (ref 5–15)
BUN: 43 mg/dL — ABNORMAL HIGH (ref 8–23)
CO2: 27 mmol/L (ref 22–32)
Calcium: 9.4 mg/dL (ref 8.9–10.3)
Chloride: 98 mmol/L (ref 98–111)
Creatinine: 1.51 mg/dL — ABNORMAL HIGH (ref 0.44–1.00)
GFR, Estimated: 35 mL/min — ABNORMAL LOW
Glucose, Bld: 127 mg/dL — ABNORMAL HIGH (ref 70–99)
Potassium: 5.2 mmol/L — ABNORMAL HIGH (ref 3.5–5.1)
Sodium: 136 mmol/L (ref 135–145)
Total Bilirubin: 0.4 mg/dL (ref 0.0–1.2)
Total Protein: 6.9 g/dL (ref 6.5–8.1)

## 2024-12-06 LAB — CBC WITH DIFFERENTIAL (CANCER CENTER ONLY)
Abs Immature Granulocytes: 0.06 10*3/uL (ref 0.00–0.07)
Basophils Absolute: 0 10*3/uL (ref 0.0–0.1)
Basophils Relative: 0 %
Eosinophils Absolute: 0 10*3/uL (ref 0.0–0.5)
Eosinophils Relative: 0 %
HCT: 24.1 % — ABNORMAL LOW (ref 36.0–46.0)
Hemoglobin: 8 g/dL — ABNORMAL LOW (ref 12.0–15.0)
Immature Granulocytes: 1 %
Lymphocytes Relative: 4 %
Lymphs Abs: 0.4 10*3/uL — ABNORMAL LOW (ref 0.7–4.0)
MCH: 31 pg (ref 26.0–34.0)
MCHC: 33.2 g/dL (ref 30.0–36.0)
MCV: 93.4 fL (ref 80.0–100.0)
Monocytes Absolute: 0.6 10*3/uL (ref 0.1–1.0)
Monocytes Relative: 7 %
Neutro Abs: 7.6 10*3/uL (ref 1.7–7.7)
Neutrophils Relative %: 88 %
Platelet Count: 276 10*3/uL (ref 150–400)
RBC: 2.58 MIL/uL — ABNORMAL LOW (ref 3.87–5.11)
RDW: 17.3 % — ABNORMAL HIGH (ref 11.5–15.5)
WBC Count: 8.6 10*3/uL (ref 4.0–10.5)
nRBC: 0 % (ref 0.0–0.2)

## 2024-12-06 LAB — RAD ONC ARIA SESSION SUMMARY
Course Elapsed Days: 32
Plan Fractions Treated to Date: 15
Plan Prescribed Dose Per Fraction: 2.2 Gy
Plan Total Fractions Prescribed: 30
Plan Total Prescribed Dose: 66 Gy
Reference Point Dosage Given to Date: 33 Gy
Reference Point Session Dosage Given: 2.2 Gy
Session Number: 15

## 2024-12-06 LAB — GLUCOSE, CAPILLARY
Glucose-Capillary: 107 mg/dL — ABNORMAL HIGH (ref 70–99)
Glucose-Capillary: 116 mg/dL — ABNORMAL HIGH (ref 70–99)

## 2024-12-06 MED ORDER — CLOPIDOGREL BISULFATE 75 MG PO TABS: 75.0000 mg | ORAL_TABLET | Freq: Every day | ORAL | 1 refills | Status: AC

## 2024-12-06 NOTE — Telephone Encounter (Signed)
 Called patient's home health nurse, Amy back. Patient BP had been running low at 102/60 HR 92. Patient has been having A. FIB also. Patient takes Metoprolol  50 mg BID and Cardizem  30 mg every 6 hours. Patient has been having some dizziness with the low BP. Nurse Amy stated patient gets everything through her PEG tube and is refusing to have more fluids through PEG tube. Will send message to Dr. Jordan for advisement.

## 2024-12-06 NOTE — Progress Notes (Unsigned)
 "  [facility]  ONCOLOGY CLINIC PROGRESS NOTE   Patient Care Team: Watt Mirza, MD as PCP - General Jordan, Peter M, MD as PCP - Cardiology (Cardiology) Jesus Oliphant, MD as Consulting Physician (Otolaryngology) Izell Domino, MD as Attending Physician (Radiation Oncology) Malmfelt, Delon CROME, RN as Oncology Nurse Navigator Autumn Millman, MD as Consulting Physician (Oncology) Shelah Lamar RAMAN, MD as Consulting Physician (Pulmonary Disease)  PATIENT NAME: Taylor Bush   MR#: [id number] DOB: January 14, 1946  Date of visit: 12/06/2024   ASSESSMENT & PLAN:   CHELCIE ESTORGA is a 79 y.o. pleasant lady with a past medical history of CKD stage III, hypertension, dyslipidemia, arthritis, peripheral vascular disease, past smoking, was referred to our clinic for recently diagnosed squamous of carcinoma of the tongue status post left partial glossectomy on 06/17/2024 followed by neck dissection on 08/21/2024.  pT2, pN3B, M0 disease. Stage IVb disease.   No problem-specific Assessment & Plan notes found for this encounter.   Oral mucositis due to antineoplastic therapy Oral mucositis presenting as gingival bleeding during brushing, attributed to radiation and chemotherapy. No evidence of superimposed infection. She adheres to recommended oral care measures. - Continued nonalcoholic mouthwash and baking soda/salt rinses as tolerated.  Chemotherapy-induced thrombocytopenia and anemia Worsening thrombocytopenia and anemia despite chemotherapy dose reduction, secondary to ongoing chemoradiation. Cytopenias require close monitoring and holding of chemotherapy until recovery. - Monitored complete blood count closely. - Held chemotherapy until blood counts recover to safe levels.  Diarrhea Diarrhea with 3-4 loose stools daily, managed with loperamide . No abdominal pain or cramping. Symptoms consistent with antineoplastic therapy side effects. - Continued loperamide  as needed (2 tablets  initially, then 1 tablet after each loose bowel movement).  Prophylactic antibiotics for infection risk in immunosuppression Ongoing neutropenia places her at high risk for infection. No current fever or signs of active infection. Cough is likely viral and managed symptomatically. Prophylactic antibiotics indicated. - Prescribed azithromycin  (Z-Pak): 2 tablets on day 1, then 1 tablet daily for a total of 5 days. - Advised use of over-the-counter cough medications (Robitussin D or Mucinex  D) as needed for cough symptoms.  Chronic kidney disease She has mild chronic kidney disease with fluctuating creatinine. Chemotherapy regimen and dosing were selected to minimize nephrotoxicity, with ongoing monitoring and dose adjustments as needed. - Selected carboplatin  for chemotherapy and dose-reduced based on renal function. - Planned ongoing adjustment of chemotherapy dosing according to renal function.   I reviewed lab results and outside records for this visit and discussed relevant results with the patient. Diagnosis, plan of care and treatment options were also discussed in detail with the patient. Opportunity provided to ask questions and answers provided to her apparent satisfaction. Provided instructions to call our clinic with any problems, questions or concerns prior to return visit. I recommended to continue follow-up with PCP and sub-specialists. She verbalized understanding and agreed with the plan.   NCCN guidelines have been consulted in the planning of this patients care.  I spent a total of 45 minutes during this encounter with the patient including review of chart and various tests results, discussions about plan of care and coordination of care plan.   Millman Autumn, MD  12/06/2024 8:49 AM  [facility] - A DEPT OF [facility] [address] [place] [address] Dept: [phone] Dept Fax: [phone]    CHIEF  COMPLAINT/ REASON FOR VISIT:   Squamous of carcinoma of the tongue  status post left partial glossectomy on 06/17/2024 followed by neck dissection on 08/21/2024.  pT2, pN3B, M0 disease. Stage IVb disease.   Current Treatment: Adjuvant concurrent chemoradiation with weekly carboplatin /paclitaxel , starting from 11/04/2024  INTERVAL HISTORY:    Discussed the use of AI scribe software for clinical note transcription with the patient, who gave verbal consent to proceed.  History of Present Illness Taylor Bush is a 79 year old female with malignant neoplasm of the anterior tongue with lymph node involvement, currently undergoing concurrent chemoradiation, who presents for follow-up of worsening cytopenias and treatment-related complications.  She is receiving chemotherapy and radiation for tongue cancer. Over the past week, her white blood cell count declined from 2,100 to 1,000, platelet count from 208,000 to 131,000, and hemoglobin from 8.5 to 7.8. She has received booster injections for cytopenias, including one today. Chemotherapy scheduled for Monday was held.  She reports oral mucositis manifesting as gingival bleeding during tooth brushing, which began after initiation of radiation therapy. She uses nonalcoholic mouthwash and baking soda/salt rinses as recommended. Bleeding is limited to brushing, and she denies dysphagia. Her voice is hoarse, but she is able to speak and swallow without difficulty. She is not currently receiving speech therapy but has been evaluated by nutrition services.  She describes a new, non-productive cough with occasional clear sputum, without fever or hemoptysis. She is not taking medication for the cough. Diarrhea persists, with three to four loose bowel movements daily, managed with loperamide ; she denies abdominal pain or cramping.  She experienced a fall at home due to a loose board, resulting in bruising but without loss of consciousness or syncope. She presented  to the emergency department the night prior for a feeding tube issue, which was resolved without further complication.   TRACEY HERMANCE is a 79 year old female with stage IVb squamous cell carcinoma of the anterior tongue undergoing adjuvant chemoradiation who presents for management of treatment-related toxicities.  She remains in the adjuvant phase of treatment for stage IVb squamous cell carcinoma of the anterior tongue, currently receiving concurrent radiation and chemotherapy. Her last chemotherapy cycle was on November 18, 2024, with subsequent cycles deferred due to significant cytopenias, including neutropenia, anemia, and thrombocytopenia. She has received dose-reduced carboplatin  and paclitaxel , with further reduction planned due to persistent cytopenias. Hemoglobin has stabilized at 8, and her white cell and platelet counts are currently within normal limits. She continues with radiation therapy as tolerated.  She is experiencing severe oral mucositis, characterized by persistent mouth pain, gingival bleeding, and inability to tolerate oral intake. Dysphagia prevents her from eating solids, and she is dependent on PEG tube feeds for nutrition and hydration. Oral pain is managed with magic mouthwash and oxycodone .  She developed swelling and signs of infection in her upper limb at a prior IV site following recent hospitalization. There is no associated pain. She was started on ciprofloxacin via her feeding tube.  She was hospitalized last week for atrial fibrillation with rapid ventricular response. Her heart rhythm remains abnormal, but her heart rate is controlled. She is taking apixaban  for stroke prevention, which she associates with frequent epistaxis and dryness. There is no history of thromboembolism. Renal function has fluctuated, with creatinine rising to 1.8 from a baseline of 1.1-1.3, and is being monitored due to implications for chemotherapy and anticoagulation dosing.  Nov 22, 2024: Follow-up for stage IVb squamous cell carcinoma of the tongue post-surgery and ongoing chemoradiation. Patient experiencing chemotherapy-induced neutropenia (WBC 1000, ANC 700), thrombocytopenia, anemia, and oral mucositis; chemotherapy  held and Zarxio  injections started. Supportive care provided for mucositis and diarrhea, prophylactic antibiotics prescribed, and close monitoring of cytopenias and infection risk advised.   I have reviewed the past medical history, past surgical history, social history and family history with the patient and they are unchanged from previous note.  HISTORY OF PRESENT ILLNESS:   ONCOLOGY HISTORY:   She has a history of recurrent oral leukoplakia followed by Dr. Jesus since at least 2016. She also had a medical history notable for stage 3 CKD, chronic a-fib, and PAD.   Upon record review, she presented to Dr. Jesus on 08/14/23 for evaluation of new white spots on the posterior left lateral aspect of the tongue. Recurrent oral leukoplakia was suspected and laser ablation and biopsies under anesthesia were recommended (which she had undergone in the past in the same setting). Biopsies of the left tongue were promptly obtained on 10/02/23 and showed moderate squamous dysplasia without evidence of carcinoma.    She remained without issues to her tongue in the following months but later presented to Dr. Jesus on 06/13/24 with c/o worsening irritation to the left side of her tongue over the past several months. Oral exam performed at that time noted a 3 cm raised ulcerative lesion with surrounding leukoplakia to the left posterior lateral tongue.    Based on Dr. Godfrey recommendations, she opted to proceed with a left partial glossectomy on 06/17/24. Pathology from the procedure revealed: tumor the size of 1.9 cm; histology of invasive well to moderately differentiated squamous cell carcinoma invading a depth of 0.9 cm, along with adjacent high-grade squamous dysplasia;  p16 negative; angiolymphatic invasion present; all margins negative for dysplasia or carcinoma. No lymph nodes were excised.    Based on the depth of invasion, she underwent left modified neck dissection on 08/21/24. Pathology from the procedure revealed: 03/33 lymph nodes positive for metastatic SCC; the largest lymph node involved was a level II lymph node measuring 1.9 cm. The same large level II lymph node was also positive for focal extracapsular extension; with a distance of less than 2 mm from the lymph node capsule (microscopic extranodal extension).    Her post-op course was uncomplicated per her subsequent follow-up examinations by Dr. Jesus. Radiation therapy has been recommended and Dr. Jesus also referred her to medical oncology.    Pertinent imaging performed thus far includes:  -- Soft tissue neck CT with contrast on 09/18/24 which demonstrated: a prominent heterogeneous left level 3/4 cervical lymph node measuring 1.0 x 1.2 cm with central hypoattenuation concerning for possible central necrosis; subtle asymmetric soft tissue at the left posterior tongue (likely related to recent surgical changes); and postsurgical changes of the left neck with presumed surgical absence of the left submandibular gland.  -- CT of the abdomen on 09/18/24 showed no evidence of metastatic disease in the abdomen or acute findings overall.  --CT Chest 09/18/24:  Lower cervical lymphadenopathy characterized on CT neck performed at the same time and reported separately. Mild mediastinal lymphadenopathy. Metastatic involvement not excluded. PET-CT suggested to further evaluate. 8 mm anterior left upper lobe pulmonary nodule shows mixed attenuation with sub solid features. This nodule warrants close follow-up as metastatic disease or lung primary not excluded. PET-CT may prove helpful.  Small right pleural effusion with dependent atelectasis right lower lobe.   On her consultation with us  on 09/20/2024, request  placed for staging PET-CT scan for further evaluation of chest lymphadenopathy.   On 10/01/2024, PET scan showed hypermetabolic left cervical lymph  node, indicative of residual/recurrent disease. Hypermetabolic 4 mm parotid nodule, worrisome for malignancy. No abnormal hypermetabolism associated with the left posterior tongue, as questioned on CT neck 09/18/2024. Small right pleural effusion. Aortic atherosclerosis (ICD10-I70.0). Coronary artery calcification. Enlarged pulmonic trunk, indicative of pulmonary arterial hypertension.  She had a follow-up CT super D of chest on 10/11/2024 which showed  7 mm anterior segment left upper lobe nodule, unchanged from 09/18/2024 and too small for PET resolution on 10/01/2024. This could be followed with CT chest without contrast in 3 months, as clinically indicated, in this patient with a history of tongue cancer. Small bilateral pleural effusions.  On 10/14/2024, she underwent bronchoscopy and biopsy of the left upper lobe lung nodule and lymph nodes and pathology showed reactive cells without evidence of malignancy.  pT2, pN3B, M0 disease. Stage IVb disease.   Plan made to proceed with concurrent chemoradiation.   Given her CKD, she is not a candidate for cisplatin.  Hence plan made to proceed with weekly carboplatin /paclitaxel .  Carboplatin  dose reduced to AUC 1.5, considering her age and comorbidities.  She started concurrent chemoradiation from 11/04/2024.   Oncology History  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)  09/20/2024 Initial Diagnosis   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)   09/20/2024 Cancer Staging   Staging form: Oral Cavity, AJCC 8th Edition - Pathologic stage from 09/20/2024: Stage IVB (pT2, pN3b, cM0) - Signed by Autumn Millman, MD on 10/18/2024 Stage prefix: Initial diagnosis   11/04/2024 -  Chemotherapy   Patient is on Treatment Plan : HEAD/NECK Carboplatin  + Paclitaxel  + XRT q7d          REVIEW OF SYSTEMS:   Review of Systems - Oncology  All other pertinent systems were reviewed with the patient and are negative.  ALLERGIES: She is allergic to statins.  MEDICATIONS:  Current Outpatient Medications  Medication Sig Dispense Refill   apixaban  (ELIQUIS ) 5 MG TABS tablet Take 1 tablet (5 mg total) by mouth 2 (two) times daily. 60 tablet 3   cephALEXin  (KEFLEX ) 500 MG capsule Take 1 capsule (500 mg total) by mouth 3 (three) times daily. 30 capsule 0   clopidogrel  (PLAVIX ) 75 MG tablet Take 1 tablet (75 mg total) by mouth daily with breakfast. 30 tablet 3   dexamethasone  (DECADRON ) 4 MG tablet Take 8 mg by mouth See admin instructions. Take 8 mg by mouth once a day with food for 2 days- starting the day after chemotherapy (per patient/daughter reports taking all medicine by feeding tube)     diltiazem  (CARDIZEM ) 30 MG tablet Place 1 tablet (30 mg total) into feeding tube every 6 (six) hours. 120 tablet 0   Faricimab -svoa (VABYSMO  IZ) 1 Dose by Intravitreal route every 6 (six) weeks.     lidocaine  (XYLOCAINE ) 2 % solution Patient: Mix 1 part 2% viscous lidocaine , 1 part water . Swish & swallow 10mL of diluted mixture 30 minutes before meals and at bedtime, up to 4 times daily 200 mL 3   lidocaine -prilocaine  (EMLA ) cream Apply to affected area once 30 g 3   magic mouthwash (nystatin , lidocaine , diphenhydrAMINE , alum & mag hydroxide) suspension Take 5 mLs by mouth 4 (four) times daily as needed for mouth pain. Suspension contains equal amounts of Maalox Extra Strength, nystatin , diphenhydramine  and lidocaine . 1:1:1:1 ratio please. 250 mL 4   metoprolol  tartrate (LOPRESSOR ) 50 MG tablet Place 1 tablet (50 mg total) into feeding tube 2 (two) times daily. 60 tablet 0   Nutritional Supplements (NUTREN  1.5) LIQD Nutren 1.5 - give one 250 ml carton 5x/day via PEG. Flush tube with 60 ml water  before and after each bolus. Give additional 120 ml free water  TID to meet hydration. Provides 1875  kcal, 85 g, 955 ml water  (1915 ml total water ). 1250 ml/day meets 100% DRI     ondansetron  (ZOFRAN ) 8 MG tablet Take 1 tablet (8 mg total) by mouth every 8 (eight) hours as needed for nausea or vomiting. Start on the third day after chemotherapy. 30 tablet 1   oxyCODONE  (OXY IR/ROXICODONE ) 5 MG immediate release tablet Take 1 tablet (5 mg total) by mouth every 6 (six) hours as needed for severe pain (pain score 7-10). 60 tablet 0   prochlorperazine  (COMPAZINE ) 10 MG tablet Take 1 tablet (10 mg total) by mouth every 6 (six) hours as needed for nausea or vomiting. 30 tablet 1   promethazine -dextromethorphan  (PROMETHAZINE -DM) 6.25-15 MG/5ML syrup Take 2.5 mLs by mouth 4 (four) times daily as needed for cough. 118 mL 0   sodium fluoride (FLUORISHIELD) 1.1 % GEL dental gel Apply thin layer of fluoride gel inside the trays and wear them for 20 minutes nightly. Start after completion of HNRT     traMADol  (ULTRAM ) 50 MG tablet Take 1 tablet (50 mg total) by mouth every 8 (eight) hours as needed. 40 tablet 2   No current facility-administered medications for this visit.     VITALS:   There were no vitals taken for this visit.  Wt Readings from Last 3 Encounters:  12/04/24 127 lb 2 oz (57.7 kg)  12/02/24 121 lb (54.9 kg)  11/29/24 133 lb 2.5 oz (60.4 kg)    There is no height or weight on file to calculate BMI.      PHYSICAL EXAM:   Physical Exam Constitutional:      General: She is not in acute distress.    Appearance: Normal appearance.  HENT:     Head: Normocephalic and atraumatic.     Mouth/Throat:     Comments: Well-healed scar along the left tongue bordering the floor of mouth  Cardiovascular:     Rate and Rhythm: Normal rate.  Pulmonary:     Effort: Pulmonary effort is normal. No respiratory distress.  Abdominal:     General: There is no distension.  Lymphadenopathy:     Cervical: Cervical adenopathy (Left-sided neck dissection scar has healed well.  Mild lymphedema noted.)  present.  Neurological:     General: No focal deficit present.     Mental Status: She is alert and oriented to person, place, and time.  Psychiatric:        Mood and Affect: Mood normal.        Behavior: Behavior normal.      LABORATORY DATA:   I have reviewed the data as listed.  No results found for any visits on 12/06/24.      RADIOGRAPHIC STUDIES:  I have personally reviewed the radiological images as listed and agree with the findings in the report.  DG Swallowing Func-Speech Pathology CLINICAL DATA:  Dysphagia. Cough/GE reflux disease/other secondary diagnosis  EXAM: MODIFIED BARIUM SWALLOW  TECHNIQUE: Different consistencies of barium were administered orally to the patient by the Speech Pathologist. Imaging of the pharynx was performed in the lateral projection.  Radiologist, not in attendance for the exam.  FLUOROSCOPY TIME:  Radiation Exposure Index (as provided by the fluoroscopic device): 4.9 mGy Kerma  COMPARISON:  None Available.  FINDINGS: Modified barium swallow was performed by the  speech pathologist. Radiologist was not involved with this exam. Please refer to the Speech Pathology report for results and recommendations.  IMPRESSION: Please refer to the Speech Pathologists report for complete details and recommendations.  Electronically Signed   By: Lynwood Landy Raddle M.D.   On: 11/29/2024 15:03    CODE STATUS:  Code Status History     Date Active Date Inactive Code Status Order ID Comments User Context   08/21/2024 1658 08/22/2024 1914 Full Code [id number]  Jesus Oliphant, MD Inpatient   07/06/2024 1129 07/13/2024 2046 Full Code [id number]  Dennise Lavada POUR, MD ED   06/17/2024 1540 06/18/2024 1625 Full Code [id number]  Jesus Oliphant, MD Inpatient   12/21/2011 1452 12/24/2011 1530 Full Code [id number]  Ramonita Consuelo PARAS, RN Inpatient    Questions for Most Recent Historical Code Status (Order [id number])     Question Answer   By: Other             No orders of the defined types were placed in this encounter.    Future Appointments  Date Time Provider Department Center  12/06/2024  9:15 AM [facility] None  12/06/2024 10:30 AM [facility] [id number] [facility] None  12/06/2024 11:15 AM Ivonne Harlene RAMAN, RD [facility] None  12/09/2024 10:30 AM [facility] LINAC 4 [facility] None  12/10/2024 10:30 AM [facility] LINAC 4 [facility] None  12/11/2024 10:30 AM [facility] LINAC 4 [facility] None  12/12/2024 10:30 AM [facility] LINAC 4 [facility] None  12/13/2024 10:45 AM [facility] [id number] [facility] None  12/13/2024 11:15 AM Ivonne Harlene RAMAN, RD [facility] None  12/16/2024 10:30 AM [facility] LINAC 4 [facility] None  12/17/2024 10:30 AM [facility] LINAC 4 [facility] None  12/18/2024 10:30 AM [facility] LINAC 4 [facility] None  12/19/2024 10:30 AM [facility] LINAC 4 [facility] None  12/20/2024 10:30 AM [facility] LINAC 4 [facility] None  12/20/2024 11:15 AM Ivonne Harlene RAMAN, RD [facility] None  12/23/2024 10:30 AM [facility] LINAC 4 [facility] None  12/24/2024 10:30 AM [facility] LINAC 4 [facility] None  12/25/2024 10:30 AM [facility] LINAC 4 [facility] None  12/26/2024 10:30 AM [facility] LINAC 4 [facility] None  12/27/2024 10:30 AM [facility] LINAC 4 [facility] None  12/30/2024  2:00 PM Jacelyn Lupita NOVAK, CCC-SLP [facility]  12/31/2024  3:10 PM Daneen Damien BROCKS, NP [facility]  01/02/2025  3:00 PM Lanis Carbon, Blaire L, PT [facility] None  04/02/2025  2:00 PM Pleas, Dipti, MD [facility] [address]  05/02/2025 11:30 AM [facility] ANNUAL WELLNESS VISIT 1 [facility] [address]      This document was completed utilizing engineer, civil (consulting). Grammatical errors, random word insertions, pronoun errors, and incomplete sentences are an occasional consequence of this system due to software limitations, ambient noise, and hardware issues. Any formal questions or concerns about the content, text or  information contained within the body of this dictation should be directly addressed to the provider for clarification.   "

## 2024-12-06 NOTE — Telephone Encounter (Signed)
 She can open the capsules and put the powder in anything.  OK to put in her tube feeds or she could put them in applesauce or anything similar.

## 2024-12-06 NOTE — Telephone Encounter (Signed)
 Called and spoke to Taylor Bush, [facility] nurse who sees her once weekly. Daughter is primary caregiver (staying with pt).   Pt actually takes Cardizem  30 mg TWO times Daily and also takes Metoprolol  tartrate 50 mg TWO times Daily. Pt also on Torsemide 10 mg once daily.

## 2024-12-06 NOTE — Progress Notes (Signed)
 Patient did not come for nutrition appointment. Next nutrition visit scheduled 2/6.

## 2024-12-06 NOTE — Telephone Encounter (Signed)
Returning call, please advise.

## 2024-12-06 NOTE — Telephone Encounter (Signed)
Taylor Bush notified as instructed by telephone.  Patient states understanding.

## 2024-12-06 NOTE — Telephone Encounter (Signed)
 Copied from CRM #[id number]. Topic: Clinical - Home Health Verbal Orders >> Dec 06, 2024  1:24 PM Berneda FALCON wrote: Caller/Agency: Rosealee from [facility] Callback Number: [phone] Service Requested: Speech Therapy Frequency: 1w 8 Any new concerns about the patient? No

## 2024-12-06 NOTE — Telephone Encounter (Signed)
 Last refilled by Cherlyn Labella, MD.  Genna to refill?

## 2024-12-06 NOTE — Telephone Encounter (Signed)
 Copied from CRM #[id number]. Topic: Clinical - Medication Refill >> Dec 06, 2024  1:15 PM Delon T wrote: Medication: clopidogrel  (PLAVIX ) 75 MG tablet- was started at hospital  Has the patient contacted their pharmacy? No (Agent: If no, request that the patient contact the pharmacy for the refill. If patient does not wish to contact the pharmacy document the reason why and proceed with request.) (Agent: If yes, when and what did the pharmacy advise?)  This is the patient's preferred pharmacy:  [facility] [address],  - [address] [place] [address] Phone: [phone] Fax: [phone]  Is this the correct pharmacy for this prescription? Yes If no, delete pharmacy and type the correct one.   Has the prescription been filled recently? Yes  Is the patient out of the medication? Yes  Has the patient been seen for an appointment in the last year OR does the patient have an upcoming appointment? Yes  Can we respond through MyChart? No  Agent: Please be advised that Rx refills may take up to 3 business days. We ask that you follow-up with your pharmacy.

## 2024-12-09 ENCOUNTER — Ambulatory Visit: Payer: Self-pay | Admitting: Family Medicine

## 2024-12-09 ENCOUNTER — Ambulatory Visit

## 2024-12-09 MED ORDER — DILTIAZEM HCL 30 MG PO TABS: 30.0000 mg | ORAL_TABLET | Freq: Four times a day (QID) | ORAL | 3 refills | Status: AC | PRN

## 2024-12-09 NOTE — Telephone Encounter (Signed)
 Ok, according to DC summary recently that is correct although torsemide is not listed. Her HR as of yesterday was ok. She can hold diltiazem . Ok to take PRN for HR > 110 bpm. Don't see where she is on torsemide   Peter Jordan MD, [facility]   Called [facility] Nurse back with Dr. Gib advisement. Amy verbalized understanding. She will also find out who prescribed the torsemide and let us  know.

## 2024-12-10 ENCOUNTER — Other Ambulatory Visit: Payer: Self-pay

## 2024-12-10 ENCOUNTER — Ambulatory Visit: Admission: RE | Admit: 2024-12-10 | Discharge: 2024-12-10 | Attending: Radiation Oncology

## 2024-12-10 ENCOUNTER — Ambulatory Visit
Admission: RE | Admit: 2024-12-10 | Discharge: 2024-12-10 | Disposition: A | Source: Ambulatory Visit | Attending: Radiation Oncology

## 2024-12-10 DIAGNOSIS — T451X5A Adverse effect of antineoplastic and immunosuppressive drugs, initial encounter: Secondary | ICD-10-CM

## 2024-12-10 DIAGNOSIS — C023 Malignant neoplasm of anterior two-thirds of tongue, part unspecified: Secondary | ICD-10-CM

## 2024-12-10 LAB — RAD ONC ARIA SESSION SUMMARY
Course Elapsed Days: 36
Plan Fractions Treated to Date: 16
Plan Prescribed Dose Per Fraction: 2.2 Gy
Plan Total Fractions Prescribed: 30
Plan Total Prescribed Dose: 66 Gy
Reference Point Dosage Given to Date: 35.2 Gy
Reference Point Session Dosage Given: 2.2 Gy
Session Number: 16

## 2024-12-11 ENCOUNTER — Inpatient Hospital Stay

## 2024-12-11 ENCOUNTER — Ambulatory Visit: Admission: RE | Admit: 2024-12-11 | Discharge: 2024-12-11 | Attending: Radiation Oncology

## 2024-12-11 ENCOUNTER — Other Ambulatory Visit: Payer: Self-pay

## 2024-12-11 ENCOUNTER — Encounter: Payer: Self-pay | Admitting: Oncology

## 2024-12-11 ENCOUNTER — Inpatient Hospital Stay: Attending: Oncology | Admitting: Nurse Practitioner

## 2024-12-11 VITALS — BP 151/75 | HR 62 | Temp 97.2°F | Resp 18 | Wt 126.0 lb

## 2024-12-11 DIAGNOSIS — G893 Neoplasm related pain (acute) (chronic): Secondary | ICD-10-CM

## 2024-12-11 DIAGNOSIS — C023 Malignant neoplasm of anterior two-thirds of tongue, part unspecified: Secondary | ICD-10-CM

## 2024-12-11 LAB — CBC WITH DIFFERENTIAL (CANCER CENTER ONLY)
Abs Immature Granulocytes: 0.02 10*3/uL (ref 0.00–0.07)
Basophils Absolute: 0 10*3/uL (ref 0.0–0.1)
Basophils Relative: 0 %
Eosinophils Absolute: 0 10*3/uL (ref 0.0–0.5)
Eosinophils Relative: 0 %
HCT: 25.2 % — ABNORMAL LOW (ref 36.0–46.0)
Hemoglobin: 8.2 g/dL — ABNORMAL LOW (ref 12.0–15.0)
Immature Granulocytes: 0 %
Lymphocytes Relative: 3 %
Lymphs Abs: 0.2 10*3/uL — ABNORMAL LOW (ref 0.7–4.0)
MCH: 31.3 pg (ref 26.0–34.0)
MCHC: 32.5 g/dL (ref 30.0–36.0)
MCV: 96.2 fL (ref 80.0–100.0)
Monocytes Absolute: 0.2 10*3/uL (ref 0.1–1.0)
Monocytes Relative: 3 %
Neutro Abs: 5.8 10*3/uL (ref 1.7–7.7)
Neutrophils Relative %: 94 %
Platelet Count: 397 10*3/uL (ref 150–400)
RBC: 2.62 MIL/uL — ABNORMAL LOW (ref 3.87–5.11)
RDW: 18.6 % — ABNORMAL HIGH (ref 11.5–15.5)
WBC Count: 6.2 10*3/uL (ref 4.0–10.5)
nRBC: 0 % (ref 0.0–0.2)

## 2024-12-11 LAB — CMP (CANCER CENTER ONLY)
ALT: 17 U/L (ref 0–44)
AST: 21 U/L (ref 15–41)
Albumin: 3.7 g/dL (ref 3.5–5.0)
Alkaline Phosphatase: 85 U/L (ref 38–126)
Anion gap: 11 (ref 5–15)
BUN: 69 mg/dL — ABNORMAL HIGH (ref 8–23)
CO2: 27 mmol/L (ref 22–32)
Calcium: 9.4 mg/dL (ref 8.9–10.3)
Chloride: 97 mmol/L — ABNORMAL LOW (ref 98–111)
Creatinine: 1.54 mg/dL — ABNORMAL HIGH (ref 0.44–1.00)
GFR, Estimated: 34 mL/min — ABNORMAL LOW
Glucose, Bld: 177 mg/dL — ABNORMAL HIGH (ref 70–99)
Potassium: 5 mmol/L (ref 3.5–5.1)
Sodium: 135 mmol/L (ref 135–145)
Total Bilirubin: 0.3 mg/dL (ref 0.0–1.2)
Total Protein: 6.9 g/dL (ref 6.5–8.1)

## 2024-12-11 LAB — RAD ONC ARIA SESSION SUMMARY
Course Elapsed Days: 37
Plan Fractions Treated to Date: 17
Plan Prescribed Dose Per Fraction: 2.2 Gy
Plan Total Fractions Prescribed: 30
Plan Total Prescribed Dose: 66 Gy
Reference Point Dosage Given to Date: 37.4 Gy
Reference Point Session Dosage Given: 2.2 Gy
Session Number: 17

## 2024-12-11 LAB — MAGNESIUM: Magnesium: 1.9 mg/dL (ref 1.7–2.4)

## 2024-12-11 MED ORDER — OXYCODONE HCL 5 MG PO TABS: 5.0000 mg | ORAL_TABLET | Freq: Four times a day (QID) | ORAL | 0 refills | Status: AC | PRN

## 2024-12-11 MED ORDER — SODIUM CHLORIDE 0.9 % IV SOLN: Freq: Once | INTRAVENOUS | Status: AC

## 2024-12-11 NOTE — Progress Notes (Unsigned)
 "     [facility]   Telephone:([phone]) [phone] Fax:([phone]    Patient Care Team: Watt Mirza, MD as PCP - General Jordan, Peter M, MD as PCP - Cardiology (Cardiology) Jesus Oliphant, MD as Consulting Physician (Otolaryngology) Izell Domino, MD as Attending Physician (Radiation Oncology) Malmfelt, Delon CROME, RN as Oncology Nurse Navigator Autumn Millman, MD as Consulting Physician (Oncology) Shelah Lamar RAMAN, MD as Consulting Physician (Pulmonary Disease)  Date of Service: 12/11/2024  CHIEF COMPLAINT:  symptom management visit  Oncology History  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)  09/20/2024 Initial Diagnosis   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)   09/20/2024 Cancer Staging   Staging form: Oral Cavity, AJCC 8th Edition - Pathologic stage from 09/20/2024: Stage IVB (pT2, pN3b, cM0) - Signed by Autumn Millman, MD on 10/18/2024 Stage prefix: Initial diagnosis   11/04/2024 -  Chemotherapy   Patient is on Treatment Plan : HEAD/NECK Carboplatin  + Paclitaxel  + XRT q7d        CURRENT THERAPY: Radiation, chemo on hold for pancytopenia  INTERVAL HISTORY Taylor Bush is seen in [facility] at the request of Dr. Izell for IV fluids.  Last chemo 1/12, she has continued radiation in the interim.  She was hospitalized 1/19 - 11/29/2024 for A-fib RVR off medication.  She had a swallow study with recommendations for n.p.o. due to the risk of aspiration.  She learned how to administer meds per tube.  At home she has been doing 4.5 cartons of tube feeds with water  flushes and separate 8 ounces of water  5 times daily.  She is able to swallow some liquids and medication by mouth with no significant odynophagia or dysphagia, pain meds are helpful.  Bowels moving, more formed with using Imodium .  Cough that began with radiation and worsened with pneumonia has improved, no chest pain, dyspnea or recurrent fever.  Over the weekend she was tired with  lightheadedness and dizziness on standing but feels a little better today.  ROS  All other systems reviewed and negative  Past Medical History:  Diagnosis Date   Allergic rhinitis    Anemia in chronic kidney disease (CKD)    B12 deficiency 09/13/2021   Blood transfusion 2012   and 2013   Chronic low back pain    CKD (chronic kidney disease) stage 3, GFR 30-59 ml/min (HCC) 11/29/2013   HLD (hyperlipidemia)    HTN (hypertension)    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) 09/20/2024   Open-angle glaucoma    PAD (peripheral artery disease)    with intermittent claudication, ABI 0.72 on Rt and 0.73 on L when assessed in 2010.    Paroxysmal atrial fibrillation (HCC) 08/21/2024     Past Surgical History:  Procedure Laterality Date   ABDOMINAL AORTOGRAM W/LOWER EXTREMITY N/A 07/09/2024   Procedure: ABDOMINAL AORTOGRAM W/LOWER EXTREMITY;  Surgeon: Serene Gaile ORN, MD;  Location: [facility];  Service: Cardiovascular;  Laterality: N/A;   abnormal vascular studies  05/07/2009   ABI's 70, R SFA > 50%^ blockage    APPLICATION OF WOUND VAC Left 07/12/2024   Procedure: APPLICATION, WOUND VAC;  Surgeon: Harden Jerona GAILS, MD;  Location: [facility] OR;  Service: Orthopedics;  Laterality: Left;   CATARACT EXTRACTION, BILATERAL  2011   EXCISION OF TONGUE LESION WITH LASER N/A 08/31/2015   Procedure: BIOPSY AND CO2 LASER OF TONGUE LESION ;  Surgeon: Oliphant Jesus, MD;  Location: [facility];  Service: ENT;  Laterality: N/A;   EXCISION OF TONGUE LESION WITH LASER Left 06/29/2020   Procedure: EXCISION OF TONGUE LESION/CO2 laser w/biopsy;  Surgeon: Jesus Oliphant, MD;  Location: [facility];  Service: ENT;  Laterality: Left;   EXCISION OF TONGUE LESION WITH LASER Bilateral 10/02/2023   Procedure: ORAL BIOPSY AND CO2 LASER ABLATATION OF TONGUE LESION;  Surgeon: Jesus Oliphant, MD;  Location: [facility];  Service: ENT;  Laterality: Bilateral;   EXCISION ORAL LESION WITH CO2  LASER N/A 12/08/2021   Procedure: EXCISION OF ORAL ORAL CAVITY LESION WITH CO2 LASER;  Surgeon: Jesus Oliphant, MD;  Location: [facility];  Service: ENT;  Laterality: N/A;   EYE SURGERY     GLOSSECTOMY, PARTIAL Left 06/17/2024   Procedure: GLOSSECTOMY, PARTIAL;  Surgeon: Jesus Oliphant, MD;  Location: [facility] OR;  Service: ENT;  Laterality: Left;   INCISION AND DRAINAGE OF WOUND Left 07/12/2024   Procedure: IRRIGATION AND DEBRIDEMENT WOUND;  Surgeon: Harden Jerona GAILS, MD;  Location: [facility] OR;  Service: Orthopedics;  Laterality: Left;  LEFT FOOT DEBRIDEMENT   IR GASTROSTOMY TUBE MOD SED  11/01/2024   IR IMAGING GUIDED PORT INSERTION  11/01/2024   JOINT REPLACEMENT     LOWER EXTREMITY ANGIOGRAPHY  07/09/2024   Procedure: Lower Extremity Angiography;  Surgeon: Serene Gaile ORN, MD;  [facility]: [facility];  Service: Cardiovascular;;   LOWER EXTREMITY INTERVENTION  07/09/2024   Procedure: LOWER EXTREMITY INTERVENTION;  Surgeon: Serene Gaile ORN, MD;  Location: [facility];  Service: Cardiovascular;;   OTHER SURGICAL HISTORY     ganglion cyst removed left wrist    PERIPHERAL INTRAVASCULAR LITHOTRIPSY  07/09/2024   Procedure: PERIPHERAL INTRAVASCULAR LITHOTRIPSY;  Surgeon: Serene Gaile ORN, MD;  Location: [facility];  Service: Cardiovascular;;   PERIPHERAL VASCULAR ATHERECTOMY  07/09/2024   Procedure: PERIPHERAL VASCULAR ATHERECTOMY;  Surgeon: Serene Gaile ORN, MD;  Location: [facility];  Service: Cardiovascular;;   RADICAL NECK DISSECTION Left 08/21/2024   Procedure: DISSECTION, NECK, RADICAL;  Surgeon: Jesus Oliphant, MD;  Location: [facility] OR;  Service: ENT;  Laterality: Left;   TOTAL HIP ARTHROPLASTY  10/03/2011   Procedure: TOTAL HIP ARTHROPLASTY;  Surgeon: Dempsey GAILS Moan;  Location: [facility];  Service: Orthopedics;  Laterality: Right;   TOTAL HIP ARTHROPLASTY  12/21/2011   Procedure: TOTAL HIP ARTHROPLASTY;  Surgeon: Dempsey GAILS Moan, MD;  Location: [facility];  Service: Orthopedics;   Laterality: Left;   TUBAL LIGATION     VIDEO BRONCHOSCOPY WITH ENDOBRONCHIAL NAVIGATION Left 10/14/2024   Procedure: VIDEO BRONCHOSCOPY WITH ENDOBRONCHIAL NAVIGATION;  Surgeon: Shelah Lamar RAMAN, MD;  Location: [facility];  Service: Pulmonary;  Laterality: Left;   VIDEO BRONCHOSCOPY WITH ENDOBRONCHIAL ULTRASOUND Bilateral 10/14/2024   Procedure: BRONCHOSCOPY, WITH EBUS;  Surgeon: Shelah Lamar RAMAN, MD;  Location: [facility];  Service: Pulmonary;  Laterality: Bilateral;     Outpatient Encounter Medications as of 12/11/2024  Medication Sig Note   apixaban  (ELIQUIS ) 5 MG TABS tablet Take 1 tablet (5 mg total) by mouth 2 (two) times daily.    cephALEXin  (KEFLEX ) 500 MG capsule Take 1 capsule (500 mg total) by mouth 3 (three) times daily.    clopidogrel  (PLAVIX ) 75 MG tablet Take 1 tablet (75 mg total) by mouth daily with breakfast.    dexamethasone  (DECADRON ) 4 MG tablet Take 8 mg by mouth See admin instructions. Take 8 mg by mouth once a day with food for 2 days- starting the day after chemotherapy (per patient/daughter reports  taking all medicine by feeding tube)    diltiazem  (CARDIZEM ) 30 MG tablet Place 1 tablet (30 mg total) into feeding tube 4 (four) times daily as needed (for Heart Rate over 110).    Faricimab -svoa (VABYSMO  IZ) 1 Dose by Intravitreal route every 6 (six) weeks. 06/13/2024: Given at MD office   lidocaine  (XYLOCAINE ) 2 % solution Patient: Mix 1 part 2% viscous lidocaine , 1 part water . Swish & swallow 10mL of diluted mixture 30 minutes before meals and at bedtime, up to 4 times daily    lidocaine -prilocaine  (EMLA ) cream Apply to affected area once    magic mouthwash (nystatin , lidocaine , diphenhydrAMINE , alum & mag hydroxide) suspension Take 5 mLs by mouth 4 (four) times daily as needed for mouth pain. Suspension contains equal amounts of Maalox Extra Strength, nystatin , diphenhydramine  and lidocaine . 1:1:1:1 ratio please.    metoprolol  tartrate (LOPRESSOR ) 50 MG tablet Place 1 tablet  (50 mg total) into feeding tube 2 (two) times daily.    Nutritional Supplements (NUTREN 1.5) LIQD Nutren 1.5 - give one 250 ml carton 5x/day via PEG. Flush tube with 60 ml water  before and after each bolus. Give additional 120 ml free water  TID to meet hydration. Provides 1875 kcal, 85 g, 955 ml water  (1915 ml total water ). 1250 ml/day meets 100% DRI    ondansetron  (ZOFRAN ) 8 MG tablet Take 1 tablet (8 mg total) by mouth every 8 (eight) hours as needed for nausea or vomiting. Start on the third day after chemotherapy.    prochlorperazine  (COMPAZINE ) 10 MG tablet Take 1 tablet (10 mg total) by mouth every 6 (six) hours as needed for nausea or vomiting.    promethazine -dextromethorphan  (PROMETHAZINE -DM) 6.25-15 MG/5ML syrup Take 2.5 mLs by mouth 4 (four) times daily as needed for cough.    sodium fluoride (FLUORISHIELD) 1.1 % GEL dental gel Apply thin layer of fluoride gel inside the trays and wear them for 20 minutes nightly. Start after completion of HNRT    traMADol  (ULTRAM ) 50 MG tablet Take 1 tablet (50 mg total) by mouth every 8 (eight) hours as needed.    [DISCONTINUED] oxyCODONE  (OXY IR/ROXICODONE ) 5 MG immediate release tablet Take 1 tablet (5 mg total) by mouth every 6 (six) hours as needed for severe pain (pain score 7-10).    oxyCODONE  (OXY IR/ROXICODONE ) 5 MG immediate release tablet Take 1 tablet (5 mg total) by mouth every 6 (six) hours as needed for severe pain (pain score 7-10).    No facility-administered encounter medications on file as of 12/11/2024.     Today's Vitals   12/11/24 1025 12/11/24 1026 12/11/24 1332 12/11/24 1333  BP: (!) 106/56 (!) 107/52 (!) 168/84 (!) 151/75  Pulse: 65  62   Resp: 17  18   Temp: (!) 97.2 F (36.2 C)     SpO2: 100%  98%   Weight: 126 lb (57.2 kg)      Body mass index is 24.61 kg/m.   ECOG PERFORMANCE STATUS: 2 - Symptomatic, <50% confined to bed  PHYSICAL EXAM GENERAL:alert, no distress and comfortable SKIN: no rash  EYES: sclera  clear NECK: without mass.  Hyperpigmentation with mild erythema at the left neck/submandibular area, no ulceration LUNGS: clear with normal breathing effort HEART: regular rate & rhythm, trace bilateral lower extremity edema ABDOMEN: abdomen soft, non-tender and normal bowel sounds.  Feeding tube with serous drainage at the dressing NEURO: alert & oriented x 3 with fluent speech PAC without erythema    CBC    Latest Ref  Rng & Units 12/11/2024    9:57 AM 12/06/2024    8:30 AM 12/04/2024   11:15 AM  CBC  WBC 4.0 - 10.5 K/uL 6.2  8.6  8.5   Hemoglobin 12.0 - 15.0 g/dL 8.2  8.0  8.6 Repeated and verified X2.   Hematocrit 36.0 - 46.0 % 25.2  24.1  26.2   Platelets 150 - 400 K/uL 397  276  200.0       CMP     Latest Ref Rng & Units 12/11/2024    9:57 AM 12/06/2024    8:30 AM 12/04/2024   11:15 AM  CMP  Glucose 70 - 99 mg/dL 822  872  874   BUN 8 - 23 mg/dL 69  43  43   Creatinine 0.44 - 1.00 mg/dL 8.45  8.48  8.19   Sodium 135 - 145 mmol/L 135  136  138   Potassium 3.5 - 5.1 mmol/L 5.0  5.2  4.6   Chloride 98 - 111 mmol/L 97  98  99   CO2 22 - 32 mmol/L 27  27  30    Calcium  8.9 - 10.3 mg/dL 9.4  9.4  8.6   Total Protein 6.5 - 8.1 g/dL 6.9  6.9  6.6   Total Bilirubin 0.0 - 1.2 mg/dL 0.3  0.4  0.5   Alkaline Phos 38 - 126 U/L 85  100  92   AST 15 - 41 U/L 21  22  16    ALT 0 - 44 U/L 17  21  18        ASSESSMENT & PLAN:   Hypotension  -Secondary to chemoRT and low intake. Instructed to increase water  per tube and as recommended by speech therapy  -Labs reviewed, cytopenias have recovered. CKD stable. Electrolytes WNL  Head and neck cancer, PT 2, PN 3B, M0 stage IVb disease -Presented 08/14/23 new white spots on the posterior left lateral tongue. 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  -Bx left tongue 10/02/23 and showed moderate squamous dysplasia without evidence of carcinoma.   -Symptoms worsened, s/p left partial glossectomy on 06/17/24. Pathology 1.9 cm; invasive well to moderately differentiated squamous cell carcinoma invading a depth of 0.9 cm, along with adjacent high-grade squamous dysplasia; p16 negative; angiolymphatic invasion present; all margins negative for dysplasia or carcinoma. No lymph nodes were excised.   -Based on the depth of invasion, she underwent left modified neck dissection on 08/21/24. Pathology revealed: 03/33 lymph nodes positive for metastatic SCC; the largest lymph node involved was a level II lymph node measuring 1.9 cm. The same large level II lymph node was also positive for focal extracapsular extension; with a distance of less than 2 mm from the lymph node capsule (microscopic extranodal extension).  -On 10/14/2024, she underwent bronchoscopy and biopsy of the left upper lobe lung nodule and lymph nodes and pathology showed reactive cells without evidence of malignancy.  - Began concurrent chemoradiation with carbo/Taxol , chemo on hold after 1/12 due to cytopenias    PLAN: -IVF today, no need for electrolyte replacement; BP improved at completion -Increase tube feeds/water  at home as tolerated  -Continue RT -Cytopenias resolved, will discuss resuming chemo with Dr. Autumn  -Refill oxycodone    All questions were answered. The patient knows to call the clinic with any problems, questions or concerns. No barriers to learning were detected. I spent 20 minutes counseling the patient face to face.  The total time spent in the appointment was 30 minutes and more than 50% was on counseling, review of test results, and coordination of care.   Karmin Kasprzak K Chantee Cerino, NP 12/12/2024  "

## 2024-12-11 NOTE — Assessment & Plan Note (Addendum)
 Please review oncology history for additional details and timeline of events.  Post-surgical status following tongue cancer with cervical lymph node involvement and extracapsular extension.   Neck dissection on October 15th with removal of 33 lymph nodes, 3 with cancer. High risk of recurrence due to extracapsular extension.   CT chest on 09/18/2024 showed mild mediastinal lymphadenopathy and metastatic involvement could not be excluded.  8 mm anterior left upper lobe lung nodule noted as well.  On her consultation with us  on 09/20/2024, request placed for staging PET-CT scan for further evaluation of chest lymphadenopathy.   On 10/01/2024, PET scan showed hypermetabolic left cervical lymph node, indicative of residual/recurrent disease. Hypermetabolic 4 mm parotid nodule, worrisome for malignancy. No abnormal hypermetabolism associated with the left posterior tongue, as questioned on CT neck 09/18/2024. Small right pleural effusion. Aortic atherosclerosis (ICD10-I70.0). Coronary artery calcification. Enlarged pulmonic trunk, indicative of pulmonary arterial hypertension.  She had a follow-up CT super D of chest on 10/11/2024 which showed  7 mm anterior segment left upper lobe nodule, unchanged from 09/18/2024 and too small for PET resolution on 10/01/2024. This could be followed with CT chest without contrast in 3 months, as clinically indicated, in this patient with a history of tongue cancer. Small bilateral pleural effusions.  On 10/14/2024, she underwent bronchoscopy and biopsy of the left upper lobe lung nodule and lymph nodes and pathology showed reactive cells without evidence of malignancy.  pT2, pN3B, M0 disease. Stage IVb disease.   Plan made to proceed with concurrent chemoradiation.   Given her CKD, she is not a candidate for cisplatin.  Hence plan made to proceed with weekly carboplatin /paclitaxel .  Carboplatin  dose reduced to AUC 1.5, considering her age and  comorbidities.  Received cycle 1 of carboplatin  and paclitaxel  on 11/04/2024.  She tolerated the first 3 cycles of chemotherapy well.  Cycle 4 had to be deferred because of leukopenia with white count of 1000, ANC of 700, hemoglobin 7.8, platelet 131,000.  She was given Zarxio  for 3 days.  She was later hospitalized for atrial fibrillation with rapid ventricular response.  Currently rate controlled.  Patient opted to defer chemotherapy and radiation for now.  Chemotherapy complicated by cytopenias and chronic kidney disease; radiation is the primary modality. Shared decision making emphasized regarding continuation of therapy, with discussion of survival estimates, recurrence risk, and risks of cytopenias and comorbidities. Goal is to minimize recurrence and maximize local/regional control.  - Continued adjuvant radiation therapy with weekly clinical assessment and supportive care.  - I will evaluate her again in 1 week with plan to resume chemotherapy at further reduced dose (approximately 50% of previous) if blood counts recover; weekly labs to monitor cytopenias and renal function.  - Weekly reassessment to determine safety and appropriateness of ongoing chemoradiation.

## 2024-12-12 ENCOUNTER — Encounter: Payer: Self-pay | Admitting: Oncology

## 2024-12-12 ENCOUNTER — Other Ambulatory Visit: Payer: Self-pay

## 2024-12-12 ENCOUNTER — Encounter: Payer: Self-pay | Admitting: Nurse Practitioner

## 2024-12-12 ENCOUNTER — Ambulatory Visit

## 2024-12-12 ENCOUNTER — Other Ambulatory Visit: Payer: Self-pay | Admitting: Oncology

## 2024-12-12 DIAGNOSIS — C023 Malignant neoplasm of anterior two-thirds of tongue, part unspecified: Secondary | ICD-10-CM

## 2024-12-12 LAB — RAD ONC ARIA SESSION SUMMARY
Course Elapsed Days: 38
Plan Fractions Treated to Date: 18
Plan Prescribed Dose Per Fraction: 2.2 Gy
Plan Total Fractions Prescribed: 30
Plan Total Prescribed Dose: 66 Gy
Reference Point Dosage Given to Date: 39.6 Gy
Reference Point Session Dosage Given: 2.2 Gy
Session Number: 18

## 2024-12-13 ENCOUNTER — Other Ambulatory Visit: Payer: Self-pay | Admitting: Oncology

## 2024-12-13 ENCOUNTER — Ambulatory Visit (HOSPITAL_COMMUNITY): Admission: RE | Admit: 2024-12-13 | Source: Ambulatory Visit

## 2024-12-13 ENCOUNTER — Other Ambulatory Visit: Payer: Self-pay

## 2024-12-13 ENCOUNTER — Ambulatory Visit: Admission: RE | Admit: 2024-12-13 | Source: Ambulatory Visit

## 2024-12-13 ENCOUNTER — Inpatient Hospital Stay: Admitting: Oncology

## 2024-12-13 ENCOUNTER — Inpatient Hospital Stay: Attending: Oncology | Admitting: Dietician

## 2024-12-13 ENCOUNTER — Inpatient Hospital Stay

## 2024-12-13 VITALS — BP 137/73 | HR 77 | Temp 98.6°F | Resp 17 | Ht 60.0 in | Wt 124.4 lb

## 2024-12-13 DIAGNOSIS — C023 Malignant neoplasm of anterior two-thirds of tongue, part unspecified: Secondary | ICD-10-CM

## 2024-12-13 DIAGNOSIS — E43 Unspecified severe protein-calorie malnutrition: Secondary | ICD-10-CM

## 2024-12-13 LAB — CBC WITH DIFFERENTIAL (CANCER CENTER ONLY)
Abs Immature Granulocytes: 0.06 10*3/uL (ref 0.00–0.07)
Basophils Absolute: 0 10*3/uL (ref 0.0–0.1)
Basophils Relative: 0 %
Eosinophils Absolute: 0 10*3/uL (ref 0.0–0.5)
Eosinophils Relative: 0 %
HCT: 25.7 % — ABNORMAL LOW (ref 36.0–46.0)
Hemoglobin: 8.6 g/dL — ABNORMAL LOW (ref 12.0–15.0)
Immature Granulocytes: 1 %
Lymphocytes Relative: 4 %
Lymphs Abs: 0.3 10*3/uL — ABNORMAL LOW (ref 0.7–4.0)
MCH: 31.6 pg (ref 26.0–34.0)
MCHC: 33.5 g/dL (ref 30.0–36.0)
MCV: 94.5 fL (ref 80.0–100.0)
Monocytes Absolute: 1.2 10*3/uL — ABNORMAL HIGH (ref 0.1–1.0)
Monocytes Relative: 13 %
Neutro Abs: 8 10*3/uL — ABNORMAL HIGH (ref 1.7–7.7)
Neutrophils Relative %: 82 %
Platelet Count: 401 10*3/uL — ABNORMAL HIGH (ref 150–400)
RBC: 2.72 MIL/uL — ABNORMAL LOW (ref 3.87–5.11)
RDW: 19.4 % — ABNORMAL HIGH (ref 11.5–15.5)
WBC Count: 9.6 10*3/uL (ref 4.0–10.5)
nRBC: 0 % (ref 0.0–0.2)

## 2024-12-13 LAB — RAD ONC ARIA SESSION SUMMARY
Course Elapsed Days: 39
Plan Fractions Treated to Date: 19
Plan Prescribed Dose Per Fraction: 2.2 Gy
Plan Total Fractions Prescribed: 30
Plan Total Prescribed Dose: 66 Gy
Reference Point Dosage Given to Date: 41.8 Gy
Reference Point Session Dosage Given: 2.2 Gy
Session Number: 19

## 2024-12-13 LAB — CMP (CANCER CENTER ONLY)
ALT: 17 U/L (ref 0–44)
AST: 20 U/L (ref 15–41)
Albumin: 3.7 g/dL (ref 3.5–5.0)
Alkaline Phosphatase: 77 U/L (ref 38–126)
Anion gap: 12 (ref 5–15)
BUN: 60 mg/dL — ABNORMAL HIGH (ref 8–23)
CO2: 27 mmol/L (ref 22–32)
Calcium: 9.5 mg/dL (ref 8.9–10.3)
Chloride: 99 mmol/L (ref 98–111)
Creatinine: 1.27 mg/dL — ABNORMAL HIGH (ref 0.44–1.00)
GFR, Estimated: 43 mL/min — ABNORMAL LOW
Glucose, Bld: 106 mg/dL — ABNORMAL HIGH (ref 70–99)
Potassium: 4.3 mmol/L (ref 3.5–5.1)
Sodium: 138 mmol/L (ref 135–145)
Total Bilirubin: 0.4 mg/dL (ref 0.0–1.2)
Total Protein: 6.8 g/dL (ref 6.5–8.1)

## 2024-12-13 LAB — MAGNESIUM: Magnesium: 2 mg/dL (ref 1.7–2.4)

## 2024-12-13 NOTE — Progress Notes (Signed)
 Nutrition Follow-up:  Patient with SCC of tongue. Patient receiving concurrent chemotherapy (carboplatin  and taxol ) and radiation. Followed by Dr Autumn and Izell.   PEG placed 12/26  1/19-1/23 admission with paroxysmal afib/diarrhea - formula switched to Compleat 1.4  Briefly met with patient and sister in office. Patient having persistent leaking around tube. Leaking is so significant, patient having to change clothes multiple times/day. IR has agreed to see patient after nutrition. Pt agreeable to let RD look at tube. Small amount of bleeding around insertion site observed. Split guaze are soaked through. Fresh guaze applied.   Patient reports up to 4 cartons of Compleat 1.4 (goal is 5), however due to leaking she does not want to continue feedings. She did 2 bolus feeds yesterday. Reports some leakage of formula while feeding. Patient is unable to eat orally due to severe dysphagia. She is drinking water  by mouth. Patient does have Ensure, but has not tried these yet.   Medications: reviewed   Labs: BUN 60, Cr 1.27  Anthropometrics: Wt 124 lb 6.4 oz today   1/30 - 126 lb 8 oz  1/16 - 127 lb 11.2 oz   Estimated Energy Needs  Kcals: 1500-1700 Protein: 70-88 Fluid: >1.5 L  NUTRITION DIAGNOSIS: Inadequate oral intake ongoing - addressing with TF   MALNUTRITION DIAGNOSIS: Severe malnutrition continues (diagnosed by inpt RD 1/20)   INTERVENTION:  IR for tube evaluation - monitor for recommendations/interventions Pt agreeable to try po Ensure - if tolerated recommend 2/day to address decrease TF Once TF is functioning recommend resuming Compleat 1.4 - increasing to goal as tolerated   One carton Compleat 1.4 via tube 5x/day provides 1750 kcal, 90g protein  MONITORING, EVALUATION, GOAL: wt trends, intake, TF   NEXT VISIT: Friday February 13 after radiation

## 2024-12-13 NOTE — Assessment & Plan Note (Signed)
 Please review oncology history for additional details and timeline of events.  Post-surgical status following tongue cancer with cervical lymph node involvement and extracapsular extension.   Neck dissection on October 15th with removal of 33 lymph nodes, 3 with cancer. High risk of recurrence due to extracapsular extension.   CT chest on 09/18/2024 showed mild mediastinal lymphadenopathy and metastatic involvement could not be excluded.  8 mm anterior left upper lobe lung nodule noted as well.  On her consultation with us  on 09/20/2024, request placed for staging PET-CT scan for further evaluation of chest lymphadenopathy.   On 10/01/2024, PET scan showed hypermetabolic left cervical lymph node, indicative of residual/recurrent disease. Hypermetabolic 4 mm parotid nodule, worrisome for malignancy. No abnormal hypermetabolism associated with the left posterior tongue, as questioned on CT neck 09/18/2024. Small right pleural effusion. Aortic atherosclerosis (ICD10-I70.0). Coronary artery calcification. Enlarged pulmonic trunk, indicative of pulmonary arterial hypertension.  She had a follow-up CT super D of chest on 10/11/2024 which showed  7 mm anterior segment left upper lobe nodule, unchanged from 09/18/2024 and too small for PET resolution on 10/01/2024. This could be followed with CT chest without contrast in 3 months, as clinically indicated, in this patient with a history of tongue cancer. Small bilateral pleural effusions.  On 10/14/2024, she underwent bronchoscopy and biopsy of the left upper lobe lung nodule and lymph nodes and pathology showed reactive cells without evidence of malignancy.  pT2, pN3B, M0 disease. Stage IVb disease.   Plan made to proceed with concurrent chemoradiation.   Given her CKD, she is not a candidate for cisplatin.  Hence plan made to proceed with weekly carboplatin /paclitaxel .  Carboplatin  dose reduced to AUC 1.5, considering her age and  comorbidities.  Received cycle 1 of carboplatin  and paclitaxel  on 11/04/2024.  She tolerated the first 3 cycles of chemotherapy well.  Cycle 4 had to be deferred because of leukopenia with white count of 1000, ANC of 700, hemoglobin 7.8, platelet 131,000.  She was given Zarxio  for 3 days.  She was later hospitalized for atrial fibrillation with rapid ventricular response.  Currently rate controlled.  Patient opted to defer chemotherapy and radiation for 1-2 weeks.   Shared decision making emphasized regarding continuation of therapy, with discussion of survival estimates, recurrence risk, and risks of cytopenias and comorbidities. Goal is to minimize recurrence and maximize local/regional control.  She resumed radiation treatments from 12/06/2024 and tolerating them well so far.  She is agreeable to resuming chemotherapy.  Previously with dose reduced carboplatin  to AUC 1.5, considering her age and comorbidities.  We will dose reduce carboplatin  further to AUC 1.  Paclitaxel  dose reduced to 40 mg/m.  Will proceed with this dose on 12/16/2024 and continue weekly during the course of radiation.  Labs today otherwise reveal no dose-limiting toxicities.  - Weekly reassessment to determine safety and appropriateness of ongoing chemoradiation.

## 2024-12-13 NOTE — Procedures (Signed)
 Patient presented to IR department today for gastrostomy tube evaluation secondary to persistent leaking at insertion site.  No saline could be removed from existing G-tube balloon.  There was some resistance with attempted removal of tube.  The tube was subsequently cut and removed with deflated balloon noted.  A new 16 French balloon retention gastrostomy tube was then inserted, balloon instilled with 4 cc saline and secured to skin site.  Tube was  flushed without difficulty.  No immediate complications.  EBL less than 2 cc.

## 2024-12-13 NOTE — Progress Notes (Signed)
 "  [facility]  ONCOLOGY CLINIC PROGRESS NOTE   Patient Care Team: Watt Mirza, MD as PCP - General Jordan, Peter M, MD as PCP - Cardiology (Cardiology) Jesus Oliphant, MD as Consulting Physician (Otolaryngology) Izell Domino, MD as Attending Physician (Radiation Oncology) Malmfelt, Delon CROME, RN as Oncology Nurse Navigator Autumn Millman, MD as Consulting Physician (Oncology) Shelah Lamar RAMAN, MD as Consulting Physician (Pulmonary Disease)  PATIENT NAME: Taylor Bush   MR#: [id number] DOB: 04-20-1946  Date of visit: 12/13/2024   ASSESSMENT & PLAN:   Taylor Bush is a 79 y.o. pleasant lady with a past medical history of CKD stage III, hypertension, dyslipidemia, arthritis, peripheral vascular disease, past smoking, was referred to our clinic for recently diagnosed squamous of carcinoma of the tongue status post left partial glossectomy on 06/17/2024 followed by neck dissection on 08/21/2024.  pT2, pN3B, M0 disease. Stage IVb disease.   Malignant neoplasm of anterior two-thirds of tongue [facility]) Please review oncology history for additional details and timeline of events.  Post-surgical status following tongue cancer with cervical lymph node involvement and extracapsular extension.   Neck dissection on October 15th with removal of 33 lymph nodes, 3 with cancer. High risk of recurrence due to extracapsular extension.   CT chest on 09/18/2024 showed mild mediastinal lymphadenopathy and metastatic involvement could not be excluded.  8 mm anterior left upper lobe lung nodule noted as well.  On her consultation with us  on 09/20/2024, request placed for staging PET-CT scan for further evaluation of chest lymphadenopathy.   On 10/01/2024, PET scan showed hypermetabolic left cervical lymph node, indicative of residual/recurrent disease. Hypermetabolic 4 mm parotid nodule, worrisome for malignancy. No abnormal hypermetabolism associated with the left posterior tongue, as  questioned on CT neck 09/18/2024. Small right pleural effusion. Aortic atherosclerosis (ICD10-I70.0). Coronary artery calcification. Enlarged pulmonic trunk, indicative of pulmonary arterial hypertension.  She had a follow-up CT super D of chest on 10/11/2024 which showed  7 mm anterior segment left upper lobe nodule, unchanged from 09/18/2024 and too small for PET resolution on 10/01/2024. This could be followed with CT chest without contrast in 3 months, as clinically indicated, in this patient with a history of tongue cancer. Small bilateral pleural effusions.  On 10/14/2024, she underwent bronchoscopy and biopsy of the left upper lobe lung nodule and lymph nodes and pathology showed reactive cells without evidence of malignancy.  pT2, pN3B, M0 disease. Stage IVb disease.   Plan made to proceed with concurrent chemoradiation.   Given her CKD, she is not a candidate for cisplatin.  Hence plan made to proceed with weekly carboplatin /paclitaxel .  Carboplatin  dose reduced to AUC 1.5, considering her age and comorbidities.  Received cycle 1 of carboplatin  and paclitaxel  on 11/04/2024.  She tolerated the first 3 cycles of chemotherapy well.  Cycle 4 had to be deferred because of leukopenia with white count of 1000, ANC of 700, hemoglobin 7.8, platelet 131,000.  She was given Zarxio  for 3 days.  She was later hospitalized for atrial fibrillation with rapid ventricular response.  Currently rate controlled.  Patient opted to defer chemotherapy and radiation for 1-2 weeks.   Shared decision making emphasized regarding continuation of therapy, with discussion of survival estimates, recurrence risk, and risks of cytopenias and comorbidities. Goal is to minimize recurrence and maximize local/regional control.  She resumed radiation treatments from 12/06/2024 and tolerating them well so far.  She is agreeable to resuming chemotherapy.  Previously with dose reduced carboplatin  to AUC 1.5, considering  her age  and comorbidities.  We will dose reduce carboplatin  further to AUC 1.  Paclitaxel  dose reduced to 40 mg/m.  Will proceed with this dose on 12/16/2024 and continue weekly during the course of radiation.  Labs today otherwise reveal no dose-limiting toxicities.  - Weekly reassessment to determine safety and appropriateness of ongoing chemoradiation.  Complications of enteral feeding tube Persistent and worsening leakage from the feeding tube is causing significant skin irritation, loss of nutrition and medications, and weight loss. Multiple interventions have failed, impacting nutritional status and medication administration. - Coordinated with Interventional Radiology for urgent evaluation and intervention, aiming for same-day or Monday appointment. - Involved nursing staff for home visits and attempted tube tightening. - Arranged nutritionist consultation to address nutritional concerns.  Oral mucositis due to antineoplastic therapy Mild oral mucositis persists, with mouth sores on the tongue. Symptoms are manageable with mouthwash. - Encouraged continued use of mouthwash after oral hygiene.  Chemotherapy-induced cytopenias Blood counts are currently well-managed.  Cellulitis of left upper limb Cellulitis is improving with decreased swelling and erythema. Completing cephalexin  with two days remaining. - Advised completion of cephalexin  course.  Atrial fibrillation Atrial fibrillation with ongoing rhythm disturbance, managed with rate control and apixaban . Heart rate well-controlled at home, though rhythm remains abnormal. Increased bleeding risk due to anticoagulation and cytopenias, with frequent epistaxis. - Continued apixaban  at current dose, with dose reduction if renal function worsens. - Reinforced rate control as primary management strategy.  Chronic kidney disease Chronic kidney disease with recent creatinine fluctuations, impacting dosing of apixaban  and chemotherapy. CKD  complicates chemotherapy dosing and is a poor prognostic factor. - Adjusted chemotherapy and anticoagulation dosing as needed based on renal function.  Dysphagia due to cancer therapy Severe dysphagia secondary to oral mucositis from chemoradiation, requiring PEG tube feeds. Expected improvement 2-4 weeks post-radiation, but risk for persistent dysphagia and aspiration remains. - Continued PEG tube for nutrition and hydration. - Provided anticipatory guidance regarding expected improvement in swallowing function after radiation.  I reviewed lab results and outside records for this visit and discussed relevant results with the patient. Diagnosis, plan of care and treatment options were also discussed in detail with the patient. Opportunity provided to ask questions and answers provided to her apparent satisfaction. Provided instructions to call our clinic with any problems, questions or concerns prior to return visit. I recommended to continue follow-up with PCP and sub-specialists. She verbalized understanding and agreed with the plan.   NCCN guidelines have been consulted in the planning of this patients care.  I spent a total of 45 minutes during this encounter with the patient including review of chart and various tests results, discussions about plan of care and coordination of care plan.   Chinita Patten, MD  12/13/2024 6:47 PM  [facility] - A DEPT OF [facility] [address] [place] [address] Dept: [phone] Dept Fax: [phone]    CHIEF COMPLAINT/ REASON FOR VISIT:   Squamous of carcinoma of the tongue status post left partial glossectomy on 06/17/2024 followed by neck dissection on 08/21/2024.  pT2, pN3B, M0 disease. Stage IVb disease.   Current Treatment: Adjuvant concurrent chemoradiation with weekly carboplatin /paclitaxel , starting from 11/04/2024  INTERVAL HISTORY:    Discussed the use of AI scribe  software for clinical note transcription with the patient, who gave verbal consent to proceed.  History of Present Illness Taylor Bush is a 79 year old female with stage IVb squamous  cell carcinoma of the anterior tongue undergoing adjuvant concurrent chemoradiation who presents for follow-up during active treatment complicated by persistent feeding tube leakage.  She began radiation therapy last Friday and is receiving concurrent weekly chemotherapy, with two cycles remaining and radiation scheduled to complete on February 23. She received intravenous fluids earlier this week. She continues to experience persistent and worsening leakage from her enteral feeding tube since placement, resulting in significant skin irritation, frequent need to change clothes (up to four times daily), and reluctance to eat due to discomfort. There is concern that medications may be leaking during administration. She has lost two pounds over the past week. Multiple interventions, including taping and tightening, have been unsuccessful. Nursing staff have provided home support.  She reports mild oral mucositis, with mouth sores present on the side of her tongue. She did not use her mouthwash after brushing her teeth this morning but is otherwise able to maintain oral hygiene and uses mouthwash for symptomatic relief.  Her blood counts have remained stable during therapy, with hemoglobin of 8.6 (previously 8.2 and 8.0), normal white cell and platelet counts, and stable electrolytes. Renal function has improved, with creatinine of 1.27. She denies palpitations, chest pain, or shortness of breath, and her heart rate has been controlled. No new or concerning symptoms related to cytopenias have been reported.  She had cellulitis of the left upper limb last week, which has improved with oral cephalexin . The arm is less swollen and less erythematous, and she has two days remaining on her antibiotic course.  Dec 06, 2024:  Follow-up visit for stage IVb squamous cell carcinoma of the tongue; management of treatment-related cytopenias, severe oral mucositis, and dysphagia following adjuvant chemoradiation with dose-reduced carboplatin /paclitaxel . Patient dependent on PEG tube feeds due to mucositis and dysphagia, with ongoing radiation therapy and plans to resume chemotherapy at a further reduced dose pending hematologic recovery. Complications discussed including atrial fibrillation, bleeding risk, and upper limb cellulitis treated with antibiotics.  Nov 22, 2024: Follow-up for stage IVb squamous cell carcinoma of the tongue post-surgery and ongoing chemoradiation. Patient experiencing chemotherapy-induced neutropenia (WBC 1000, ANC 700), thrombocytopenia, anemia, and oral mucositis; chemotherapy held and Zarxio  injections started. Supportive care provided for mucositis and diarrhea, prophylactic antibiotics prescribed, and close monitoring of cytopenias and infection risk advised.   I have reviewed the past medical history, past surgical history, social history and family history with the patient and they are unchanged from previous note.  HISTORY OF PRESENT ILLNESS:   ONCOLOGY HISTORY:   She has a history of recurrent oral leukoplakia followed by Dr. Jesus since at least 2016. She also had a medical history notable for stage 3 CKD, chronic a-fib, and PAD.   Upon record review, she presented to Dr. Jesus on 08/14/23 for evaluation of new white spots on the posterior left lateral aspect of the tongue. Recurrent oral leukoplakia was suspected and laser ablation and biopsies under anesthesia were recommended (which she had undergone in the past in the same setting). Biopsies of the left tongue were promptly obtained on 10/02/23 and showed moderate squamous dysplasia without evidence of carcinoma.    She remained without issues to her tongue in the following months but later presented to Dr. Jesus on 06/13/24 with c/o  worsening irritation to the left side of her tongue over the past several months. Oral exam performed at that time noted a 3 cm raised ulcerative lesion with surrounding leukoplakia to the left posterior lateral tongue.    Based on Dr.  Rosen's recommendations, she opted to proceed with a left partial glossectomy on 06/17/24. Pathology from the procedure revealed: tumor the size of 1.9 cm; histology of invasive well to moderately differentiated squamous cell carcinoma invading a depth of 0.9 cm, along with adjacent high-grade squamous dysplasia; p16 negative; angiolymphatic invasion present; all margins negative for dysplasia or carcinoma. No lymph nodes were excised.    Based on the depth of invasion, she underwent left modified neck dissection on 08/21/24. Pathology from the procedure revealed: 03/33 lymph nodes positive for metastatic SCC; the largest lymph node involved was a level II lymph node measuring 1.9 cm. The same large level II lymph node was also positive for focal extracapsular extension; with a distance of less than 2 mm from the lymph node capsule (microscopic extranodal extension).    Her post-op course was uncomplicated per her subsequent follow-up examinations by Dr. Jesus. Radiation therapy has been recommended and Dr. Jesus also referred her to medical oncology.    Pertinent imaging performed thus far includes:  -- Soft tissue neck CT with contrast on 09/18/24 which demonstrated: a prominent heterogeneous left level 3/4 cervical lymph node measuring 1.0 x 1.2 cm with central hypoattenuation concerning for possible central necrosis; subtle asymmetric soft tissue at the left posterior tongue (likely related to recent surgical changes); and postsurgical changes of the left neck with presumed surgical absence of the left submandibular gland.  -- CT of the abdomen on 09/18/24 showed no evidence of metastatic disease in the abdomen or acute findings overall.  --CT Chest 09/18/24:  Lower  cervical lymphadenopathy characterized on CT neck performed at the same time and reported separately. Mild mediastinal lymphadenopathy. Metastatic involvement not excluded. PET-CT suggested to further evaluate. 8 mm anterior left upper lobe pulmonary nodule shows mixed attenuation with sub solid features. This nodule warrants close follow-up as metastatic disease or lung primary not excluded. PET-CT may prove helpful.  Small right pleural effusion with dependent atelectasis right lower lobe.   On her consultation with us  on 09/20/2024, request placed for staging PET-CT scan for further evaluation of chest lymphadenopathy.   On 10/01/2024, PET scan showed hypermetabolic left cervical lymph node, indicative of residual/recurrent disease. Hypermetabolic 4 mm parotid nodule, worrisome for malignancy. No abnormal hypermetabolism associated with the left posterior tongue, as questioned on CT neck 09/18/2024. Small right pleural effusion. Aortic atherosclerosis (ICD10-I70.0). Coronary artery calcification. Enlarged pulmonic trunk, indicative of pulmonary arterial hypertension.  She had a follow-up CT super D of chest on 10/11/2024 which showed  7 mm anterior segment left upper lobe nodule, unchanged from 09/18/2024 and too small for PET resolution on 10/01/2024. This could be followed with CT chest without contrast in 3 months, as clinically indicated, in this patient with a history of tongue cancer. Small bilateral pleural effusions.  On 10/14/2024, she underwent bronchoscopy and biopsy of the left upper lobe lung nodule and lymph nodes and pathology showed reactive cells without evidence of malignancy.  pT2, pN3B, M0 disease. Stage IVb disease.   Plan made to proceed with concurrent chemoradiation.   Given her CKD, she is not a candidate for cisplatin.  Hence plan made to proceed with weekly carboplatin /paclitaxel .  Carboplatin  dose reduced to AUC 1.5, considering her age and comorbidities.  She started  concurrent chemoradiation from 11/04/2024.   Oncology History  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)  09/20/2024 Initial Diagnosis   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)   09/20/2024 Cancer Staging   Staging form: Oral Cavity,  AJCC 8th Edition - Pathologic stage from 09/20/2024: Stage IVB (pT2, pN3b, cM0) - Signed by Autumn Millman, MD on 10/18/2024 Stage prefix: Initial diagnosis   11/04/2024 -  Chemotherapy   Patient is on Treatment Plan : HEAD/NECK Carboplatin  + Paclitaxel  + XRT q7d         REVIEW OF SYSTEMS:   Review of Systems - Oncology  All other pertinent systems were reviewed with the patient and are negative.  ALLERGIES: She is allergic to statins.  MEDICATIONS:  Current Outpatient Medications  Medication Sig Dispense Refill   apixaban  (ELIQUIS ) 5 MG TABS tablet Take 1 tablet (5 mg total) by mouth 2 (two) times daily. 60 tablet 3   cephALEXin  (KEFLEX ) 500 MG capsule Take 1 capsule (500 mg total) by mouth 3 (three) times daily. 30 capsule 0   clopidogrel  (PLAVIX ) 75 MG tablet Take 1 tablet (75 mg total) by mouth daily with breakfast. 90 tablet 1   dexamethasone  (DECADRON ) 4 MG tablet Take 8 mg by mouth See admin instructions. Take 8 mg by mouth once a day with food for 2 days- starting the day after chemotherapy (per patient/daughter reports taking all medicine by feeding tube)     diltiazem  (CARDIZEM ) 30 MG tablet Place 1 tablet (30 mg total) into feeding tube 4 (four) times daily as needed (for Heart Rate over 110). 30 tablet 3   Faricimab -svoa (VABYSMO  IZ) 1 Dose by Intravitreal route every 6 (six) weeks.     lidocaine  (XYLOCAINE ) 2 % solution Patient: Mix 1 part 2% viscous lidocaine , 1 part water . Swish & swallow 10mL of diluted mixture 30 minutes before meals and at bedtime, up to 4 times daily 200 mL 3   lidocaine -prilocaine  (EMLA ) cream Apply to affected area once 30 g 3   magic mouthwash (nystatin , lidocaine ,  diphenhydrAMINE , alum & mag hydroxide) suspension Take 5 mLs by mouth 4 (four) times daily as needed for mouth pain. Suspension contains equal amounts of Maalox Extra Strength, nystatin , diphenhydramine  and lidocaine . 1:1:1:1 ratio please. 250 mL 4   metoprolol  tartrate (LOPRESSOR ) 50 MG tablet Place 1 tablet (50 mg total) into feeding tube 2 (two) times daily. 60 tablet 0   Nutritional Supplements (NUTREN 1.5) LIQD Nutren 1.5 - give one 250 ml carton 5x/day via PEG. Flush tube with 60 ml water  before and after each bolus. Give additional 120 ml free water  TID to meet hydration. Provides 1875 kcal, 85 g, 955 ml water  (1915 ml total water ). 1250 ml/day meets 100% DRI     ondansetron  (ZOFRAN ) 8 MG tablet Take 1 tablet (8 mg total) by mouth every 8 (eight) hours as needed for nausea or vomiting. Start on the third day after chemotherapy. 30 tablet 1   oxyCODONE  (OXY IR/ROXICODONE ) 5 MG immediate release tablet Take 1 tablet (5 mg total) by mouth every 6 (six) hours as needed for severe pain (pain score 7-10). 60 tablet 0   prochlorperazine  (COMPAZINE ) 10 MG tablet Take 1 tablet (10 mg total) by mouth every 6 (six) hours as needed for nausea or vomiting. 30 tablet 1   promethazine -dextromethorphan  (PROMETHAZINE -DM) 6.25-15 MG/5ML syrup Take 2.5 mLs by mouth 4 (four) times daily as needed for cough. 118 mL 0   sodium fluoride (FLUORISHIELD) 1.1 % GEL dental gel Apply thin layer of fluoride gel inside the trays and wear them for 20 minutes nightly. Start after completion of HNRT     traMADol  (ULTRAM ) 50 MG tablet Take 1 tablet (50 mg total) by mouth every 8 (eight)  hours as needed. 40 tablet 2   No current facility-administered medications for this visit.     VITALS:   Blood pressure 137/73, pulse 77, temperature 98.6 F (37 C), temperature source Temporal, resp. rate 17, height 5' (1.524 m), weight 124 lb 6.4 oz (56.4 kg), SpO2 100%.  Wt Readings from Last 3 Encounters:  12/13/24 124 lb 6.4 oz (56.4  kg)  12/11/24 126 lb (57.2 kg)  12/06/24 126 lb 8 oz (57.4 kg)    Body mass index is 24.3 kg/m.    Onc Performance Status - 12/13/24 1031       ECOG Perf Status   ECOG Perf Status Ambulatory and capable of all selfcare but unable to carry out any work activities.  Up and about more than 50% of waking hours      KPS SCALE   KPS % SCORE Normal activity with effort, some s/s of disease          PHYSICAL EXAM:   Physical Exam Constitutional:      General: She is not in acute distress.    Appearance: Normal appearance.  HENT:     Head: Normocephalic and atraumatic.     Mouth/Throat:     Comments: Well-healed scar along the left tongue bordering the floor of mouth  Cardiovascular:     Rate and Rhythm: Normal rate. Rhythm irregular.  Pulmonary:     Effort: Pulmonary effort is normal. No respiratory distress.  Abdominal:     General: There is no distension.  Lymphadenopathy:     Cervical: Cervical adenopathy (Left-sided neck dissection scar has healed well.  Mild lymphedema noted.) present.  Neurological:     General: No focal deficit present.     Mental Status: She is alert and oriented to person, place, and time.  Psychiatric:        Mood and Affect: Mood normal.        Behavior: Behavior normal.      LABORATORY DATA:   I have reviewed the data as listed.  Results for orders placed or performed in visit on 12/13/24  Rad Onc Aria Session Summary  Result Value Ref Range   Course ID [id number]    Course Start Date 10/23/2024    Session Number 19    Course First Treatment Date 11/04/2024 10:40 AM    Course Last Treatment Date 12/13/2024 11:21 AM    Course Elapsed Days 39    Reference Point ID [email or web address] DP    Reference Point Dosage Given to Date 41.8 Gy   Reference Point Session Dosage Given 2.2 Gy   Plan ID [email or web address]    Plan Name [email or web address]_Tongu    Plan Fractions Treated to Date 19    Plan Total Fractions Prescribed 30    Plan Prescribed Dose Per Fraction 2.2 Gy    Plan Total Prescribed Dose 66.000000 Gy   Plan Primary Reference Point [email or web address]_[email or web address] DP   Results for orders placed or performed in visit on 12/13/24  CBC with Differential (Cancer Center Only)  Result Value Ref Range   WBC Count 9.6 4.0 - 10.5 K/uL   RBC 2.72 (L) 3.87 - 5.11 MIL/uL   Hemoglobin 8.6 (L) 12.0 - 15.0 g/dL   HCT 74.2 (L) 63.9 - 53.9 %   MCV 94.5 80.0 - 100.0 fL   MCH 31.6 26.0 - 34.0 pg   MCHC 33.5 30.0 - 36.0 g/dL   RDW 80.5 (H) 88.4 - 84.4 %   Platelet Count 401 (H) 150 - 400  K/uL   nRBC 0.0 0.0 - 0.2 %   Neutrophils Relative % 82 %   Neutro Abs 8.0 (H) 1.7 - 7.7 K/uL   Lymphocytes Relative 4 %   Lymphs Abs 0.3 (L) 0.7 - 4.0 K/uL   Monocytes Relative 13 %   Monocytes Absolute 1.2 (H) 0.1 - 1.0 K/uL   Eosinophils Relative 0 %   Eosinophils Absolute 0.0 0.0 - 0.5 K/uL   Basophils Relative 0 %   Basophils Absolute 0.0 0.0 - 0.1 K/uL   Immature Granulocytes 1 %   Abs Immature Granulocytes 0.06 0.00 - 0.07 K/uL  CMP (Cancer Center only)  Result Value Ref Range   Sodium 138 135 - 145 mmol/L   Potassium 4.3 3.5 - 5.1 mmol/L   Chloride 99 98 - 111 mmol/L   CO2 27 22 - 32 mmol/L   Glucose, Bld 106 (H) 70 - 99 mg/dL   BUN 60 (H) 8 - 23 mg/dL   Creatinine 8.72 (H) 9.55 - 1.00 mg/dL   Calcium  9.5 8.9 - 10.3 mg/dL   Total Protein 6.8 6.5 - 8.1 g/dL   Albumin 3.7 3.5 - 5.0 g/dL   AST 20 15 - 41 U/L   ALT 17 0 - 44 U/L   Alkaline Phosphatase 77 38 - 126 U/L   Total Bilirubin 0.4 0.0 - 1.2 mg/dL   GFR, Estimated 43 (L) >60 mL/min   Anion gap 12 5 - 15  Magnesium   Result Value Ref Range   Magnesium  2.0 1.7 - 2.4 mg/dL     RADIOGRAPHIC STUDIES:  I have personally reviewed the radiological images as listed and agree with the findings in the report.  IR REPLACE G-TUBE SIMPLE WO FLUORO INDICATION: PEG tube with copius ammounts of leakage from stoma  Patient with history of head / neck cancer and 16 Fr balloon retention gastrostomy tube placement performed on  11/01/2024; she presents now with persistent leaking from tube insertion site.  EXAM: GASTROSTOMY TUBE EXCHANGE  MEDICATIONS: None  ANESTHESIA/SEDATION: None  CONTRAST:  None  FLUOROSCOPY: None  COMPLICATIONS: None immediate.  PROCEDURE: Informed written consent was obtained from the patient after a thorough discussion of the procedural risks, benefits and alternatives. All questions were addressed. A timeout was performed prior to the initiation of the procedure. No saline could be removed from the existing gastrostomy tube balloon and there was some resistance with attempted removal of tube. The tube was subsequently cut and removed with deflated balloon noted. A new 16 French balloon retention gastrostomy tube was then inserted, 4 cc saline instilled into balloon. Tube was secured to skin site and flushed without difficulty.  IMPRESSION: Successful bedside exchange of a 16 Fr balloon-retention gastrostomy tube.  Performed by: Franky Rakers, PA-C  RECOMMENDATIONS: The patient will return to [facility] ([facility]) for routine feeding tube evaluation and exchange in 6 months.  Thom Hall, MD  [facility]  [facility]  Electronically Signed   By: Thom Hall M.D.   On: 12/13/2024 14:01    CODE STATUS:  Code Status History     Date Active Date Inactive Code Status Order ID Comments User Context   08/21/2024 1658 08/22/2024 1914 Full Code [id number]  Jesus Oliphant, MD Inpatient   07/06/2024 1129 07/13/2024 2046 Full Code [id number]  Dennise Lavada POUR, MD ED   06/17/2024 1540 06/18/2024 1625 Full Code [id number]  Jesus Oliphant, MD Inpatient   12/21/2011 1452 12/24/2011 1530 Full Code [id number]  Ramonita Consuelo PARAS,  RN Inpatient    Questions for Most Recent Historical Code Status (Order [id number])     Question Answer   By: Other            No orders of the defined types were placed in this  encounter.    Future Appointments  Date Time Provider Department Center  12/16/2024 10:30 AM [facility] [id number] [facility] None  12/16/2024 10:45 AM [facility]-SQUIRE [facility] None  12/16/2024 12:00 PM [facility] INFUSION [facility] None  12/17/2024 10:30 AM [facility] LINAC 4 [facility] None  12/18/2024 10:30 AM [facility] LINAC 4 [facility] None  12/19/2024 10:30 AM [facility] LINAC 4 [facility] None  12/20/2024  9:45 AM [facility] FLUSH [facility] None  12/20/2024 10:00 AM Carisha Kantor, MD [facility] None  12/20/2024 10:30 AM [facility] LINAC 4 [facility] None  12/20/2024 11:15 AM Ivonne Harlene RAMAN, RD [facility] None  12/23/2024 10:30 AM [facility] LINAC 4 [facility] None  12/23/2024 11:00 AM [facility] INFUSION [facility] None  12/24/2024 10:30 AM [facility] LINAC 4 [facility] None  12/25/2024 10:30 AM [facility] LINAC 4 [facility] None  12/26/2024 10:30 AM [facility] LINAC 4 [facility] None  12/27/2024 10:30 AM [facility] LINAC 4 [facility] None  12/27/2024 11:00 AM [facility] FLUSH [facility] None  12/27/2024 11:15 AM Gilberto Stanforth, MD [facility] None  12/30/2024 10:30 AM [facility] LINAC 4 [facility] None  12/30/2024 11:00 AM [facility] INFUSION [facility] None  12/30/2024  2:00 PM Jacelyn Lupita NOVAK, CCC-SLP [facility]  12/31/2024  3:10 PM Daneen Damien BROCKS, NP [facility]  01/02/2025  3:00 PM Lanis Carbon, Blaire L, PT [facility] None  04/02/2025  2:00 PM Pleas, Dipti, MD [facility] [address]  05/02/2025 11:30 AM [facility] ANNUAL WELLNESS VISIT 1 [facility] [address]  06/12/2025  3:30 PM [facility] 1 [facility] [place]      This document was completed utilizing speech recognition software. Grammatical errors, random word insertions, pronoun errors, and incomplete sentences are an occasional consequence of this system due to software limitations, ambient noise, and hardware issues. Any formal questions or concerns about the content, text or information contained  within the body of this dictation should be directly addressed to the provider for clarification.   "

## 2024-12-16 ENCOUNTER — Inpatient Hospital Stay

## 2024-12-16 ENCOUNTER — Ambulatory Visit

## 2024-12-17 ENCOUNTER — Ambulatory Visit

## 2024-12-18 ENCOUNTER — Ambulatory Visit

## 2024-12-19 ENCOUNTER — Ambulatory Visit

## 2024-12-20 ENCOUNTER — Ambulatory Visit

## 2024-12-20 ENCOUNTER — Inpatient Hospital Stay: Admitting: Dietician

## 2024-12-20 ENCOUNTER — Inpatient Hospital Stay: Admitting: Oncology

## 2024-12-20 ENCOUNTER — Inpatient Hospital Stay

## 2024-12-23 ENCOUNTER — Ambulatory Visit

## 2024-12-23 ENCOUNTER — Inpatient Hospital Stay

## 2024-12-24 ENCOUNTER — Ambulatory Visit

## 2024-12-25 ENCOUNTER — Ambulatory Visit

## 2024-12-26 ENCOUNTER — Ambulatory Visit

## 2024-12-27 ENCOUNTER — Ambulatory Visit

## 2024-12-27 ENCOUNTER — Inpatient Hospital Stay: Admitting: Oncology

## 2024-12-27 ENCOUNTER — Inpatient Hospital Stay

## 2024-12-30 ENCOUNTER — Inpatient Hospital Stay

## 2024-12-30 ENCOUNTER — Ambulatory Visit

## 2024-12-31 ENCOUNTER — Ambulatory Visit: Admitting: Nurse Practitioner

## 2025-01-02 ENCOUNTER — Ambulatory Visit: Admitting: Physical Therapy

## 2025-04-02 ENCOUNTER — Ambulatory Visit

## 2025-05-02 ENCOUNTER — Ambulatory Visit

## 2025-06-12 ENCOUNTER — Other Ambulatory Visit (HOSPITAL_COMMUNITY)
